# Patient Record
Sex: Male | Born: 1935 | Race: White | Hispanic: No | Smoking: Former smoker
Health system: Southern US, Community
[De-identification: ages and names within clinical notes are randomized; demographics above are authoritative.]

## PROBLEM LIST (undated history)

## (undated) DIAGNOSIS — I442 Atrioventricular block, complete: Secondary | ICD-10-CM

## (undated) DIAGNOSIS — J9819 Other pulmonary collapse: Secondary | ICD-10-CM

## (undated) DIAGNOSIS — E876 Hypokalemia: Secondary | ICD-10-CM

## (undated) DIAGNOSIS — E785 Hyperlipidemia, unspecified: Secondary | ICD-10-CM

## (undated) DIAGNOSIS — M549 Dorsalgia, unspecified: Secondary | ICD-10-CM

## (undated) DIAGNOSIS — G8929 Other chronic pain: Secondary | ICD-10-CM

## (undated) DIAGNOSIS — K219 Gastro-esophageal reflux disease without esophagitis: Secondary | ICD-10-CM

## (undated) DIAGNOSIS — F419 Anxiety disorder, unspecified: Secondary | ICD-10-CM

## (undated) DIAGNOSIS — K222 Esophageal obstruction: Secondary | ICD-10-CM

## (undated) DIAGNOSIS — I639 Cerebral infarction, unspecified: Secondary | ICD-10-CM

## (undated) DIAGNOSIS — T18128A Food in esophagus causing other injury, initial encounter: Secondary | ICD-10-CM

## (undated) DIAGNOSIS — I10 Essential (primary) hypertension: Secondary | ICD-10-CM

## (undated) DIAGNOSIS — I4821 Permanent atrial fibrillation: Secondary | ICD-10-CM

## (undated) HISTORY — DX: Anxiety disorder, unspecified: F41.9

## (undated) HISTORY — DX: Other pulmonary collapse: J98.19

## (undated) HISTORY — DX: Essential (primary) hypertension: I10

## (undated) HISTORY — PX: EYE SURGERY: SHX253

## (undated) HISTORY — DX: Hyperlipidemia, unspecified: E78.5

## (undated) HISTORY — DX: Atrioventricular block, complete: I44.2

## (undated) HISTORY — DX: Hypokalemia: E87.6

## (undated) HISTORY — DX: Dorsalgia, unspecified: M54.9

## (undated) HISTORY — DX: Permanent atrial fibrillation: I48.21

## (undated) HISTORY — DX: Other chronic pain: G89.29

## (undated) HISTORY — DX: Gastro-esophageal reflux disease without esophagitis: K21.9

## (undated) HISTORY — DX: Cerebral infarction, unspecified: I63.9

---

## 1990-03-02 HISTORY — PX: BACK SURGERY: SHX140

## 1991-03-03 DIAGNOSIS — J9819 Other pulmonary collapse: Secondary | ICD-10-CM

## 1991-03-03 HISTORY — DX: Other pulmonary collapse: J98.19

## 1992-05-06 ENCOUNTER — Encounter: Payer: Self-pay | Admitting: Family Medicine

## 1992-05-06 LAB — CONVERTED CEMR LAB
Blood Glucose, Fasting: 97 mg/dL
PSA: 0.5 ng/mL
TSH: 2.96 microintl units/mL

## 1994-12-01 DIAGNOSIS — E785 Hyperlipidemia, unspecified: Secondary | ICD-10-CM

## 1994-12-01 HISTORY — DX: Hyperlipidemia, unspecified: E78.5

## 1994-12-06 ENCOUNTER — Encounter: Payer: Self-pay | Admitting: Family Medicine

## 1994-12-06 LAB — CONVERTED CEMR LAB
Blood Glucose, Fasting: 98 mg/dL
PSA: 0.6 ng/mL
RBC count: 5.46 10*6/uL
TSH: 3.97 microintl units/mL
WBC, blood: 10.1 10*3/uL

## 1998-04-08 ENCOUNTER — Encounter: Payer: Self-pay | Admitting: Family Medicine

## 1998-04-08 LAB — CONVERTED CEMR LAB
Blood Glucose, Fasting: 108 mg/dL
PSA: 0.8 ng/mL

## 1999-05-13 ENCOUNTER — Encounter: Payer: Self-pay | Admitting: Family Medicine

## 1999-05-13 LAB — CONVERTED CEMR LAB
Blood Glucose, Fasting: 101 mg/dL
PSA: 0.9 ng/mL

## 2000-09-20 ENCOUNTER — Encounter: Payer: Self-pay | Admitting: Family Medicine

## 2000-09-20 LAB — CONVERTED CEMR LAB
Blood Glucose, Fasting: 100 mg/dL
PSA: 0.9 ng/mL

## 2000-10-10 ENCOUNTER — Other Ambulatory Visit: Admission: RE | Admit: 2000-10-10 | Discharge: 2000-10-10 | Payer: Self-pay | Admitting: Gastroenterology

## 2000-10-10 ENCOUNTER — Encounter (INDEPENDENT_AMBULATORY_CARE_PROVIDER_SITE_OTHER): Payer: Self-pay | Admitting: Specialist

## 2001-10-09 ENCOUNTER — Encounter: Payer: Self-pay | Admitting: Family Medicine

## 2001-10-09 LAB — CONVERTED CEMR LAB
Blood Glucose, Fasting: 95 mg/dL
PSA: 1 ng/mL
TSH: 2.86 microintl units/mL

## 2002-07-17 ENCOUNTER — Encounter: Payer: Self-pay | Admitting: Family Medicine

## 2002-07-17 LAB — CONVERTED CEMR LAB
Blood Glucose, Fasting: 102 mg/dL
RBC count: 5.56 10*6/uL
TSH: 1.58 microintl units/mL
WBC, blood: 9.1 10*3/uL

## 2002-10-31 DIAGNOSIS — K219 Gastro-esophageal reflux disease without esophagitis: Secondary | ICD-10-CM

## 2002-10-31 HISTORY — DX: Gastro-esophageal reflux disease without esophagitis: K21.9

## 2002-11-20 ENCOUNTER — Encounter: Payer: Self-pay | Admitting: Family Medicine

## 2002-11-20 LAB — CONVERTED CEMR LAB
Blood Glucose, Fasting: 112 mg/dL
PSA: 0.9 ng/mL

## 2004-03-09 ENCOUNTER — Emergency Department (HOSPITAL_COMMUNITY): Admission: EM | Admit: 2004-03-09 | Discharge: 2004-03-09 | Payer: Self-pay | Admitting: Emergency Medicine

## 2004-05-18 ENCOUNTER — Ambulatory Visit: Payer: Self-pay | Admitting: Family Medicine

## 2004-05-18 LAB — CONVERTED CEMR LAB: PSA: 1.45 ng/mL

## 2004-06-02 HISTORY — PX: CARDIAC CATHETERIZATION: SHX172

## 2004-07-28 ENCOUNTER — Ambulatory Visit: Payer: Self-pay | Admitting: Cardiovascular Disease

## 2004-07-28 ENCOUNTER — Encounter: Payer: Self-pay | Admitting: Cardiology

## 2004-07-28 ENCOUNTER — Encounter: Payer: Self-pay | Admitting: Family Medicine

## 2004-07-28 ENCOUNTER — Inpatient Hospital Stay (HOSPITAL_COMMUNITY): Admission: EM | Admit: 2004-07-28 | Discharge: 2004-07-29 | Payer: Self-pay | Admitting: Emergency Medicine

## 2004-07-28 LAB — CONVERTED CEMR LAB
RBC count: 5.44 10*6/uL
WBC, blood: 9.3 10*3/uL

## 2004-07-30 ENCOUNTER — Ambulatory Visit: Payer: Self-pay | Admitting: Gastroenterology

## 2004-08-02 ENCOUNTER — Ambulatory Visit (HOSPITAL_COMMUNITY): Admission: RE | Admit: 2004-08-02 | Discharge: 2004-08-02 | Payer: Self-pay | Admitting: Gastroenterology

## 2004-08-02 ENCOUNTER — Ambulatory Visit: Payer: Self-pay | Admitting: Gastroenterology

## 2004-08-02 ENCOUNTER — Encounter (INDEPENDENT_AMBULATORY_CARE_PROVIDER_SITE_OTHER): Payer: Self-pay | Admitting: Specialist

## 2004-08-02 LAB — HM COLONOSCOPY

## 2004-08-19 ENCOUNTER — Ambulatory Visit: Payer: Self-pay | Admitting: Cardiovascular Disease

## 2004-09-02 ENCOUNTER — Ambulatory Visit: Payer: Self-pay | Admitting: Family Medicine

## 2004-09-17 ENCOUNTER — Ambulatory Visit: Payer: Self-pay | Admitting: Cardiovascular Disease

## 2004-09-28 ENCOUNTER — Ambulatory Visit: Payer: Self-pay | Admitting: Family Medicine

## 2004-09-28 LAB — CONVERTED CEMR LAB
Blood Glucose, Fasting: 116 mg/dL
PSA: 1.32 ng/mL
RBC count: 5.39 10*6/uL
TSH: 2.46 microintl units/mL
WBC, blood: 7.6 10*3/uL

## 2004-09-29 ENCOUNTER — Ambulatory Visit: Payer: Self-pay | Admitting: Family Medicine

## 2004-10-15 ENCOUNTER — Ambulatory Visit: Payer: Self-pay | Admitting: Family Medicine

## 2004-10-22 ENCOUNTER — Ambulatory Visit: Payer: Self-pay | Admitting: Family Medicine

## 2004-11-05 ENCOUNTER — Ambulatory Visit: Payer: Self-pay | Admitting: Family Medicine

## 2004-11-19 ENCOUNTER — Ambulatory Visit: Payer: Self-pay | Admitting: Internal Medicine

## 2004-11-29 ENCOUNTER — Ambulatory Visit: Payer: Self-pay | Admitting: Family Medicine

## 2004-12-13 ENCOUNTER — Ambulatory Visit: Payer: Self-pay | Admitting: Family Medicine

## 2005-01-10 ENCOUNTER — Ambulatory Visit: Payer: Self-pay | Admitting: Family Medicine

## 2005-01-24 ENCOUNTER — Ambulatory Visit: Payer: Self-pay | Admitting: Family Medicine

## 2005-01-27 ENCOUNTER — Ambulatory Visit: Payer: Self-pay | Admitting: Family Medicine

## 2005-02-07 ENCOUNTER — Ambulatory Visit: Payer: Self-pay | Admitting: Internal Medicine

## 2005-02-10 ENCOUNTER — Ambulatory Visit: Payer: Self-pay | Admitting: Family Medicine

## 2005-03-07 ENCOUNTER — Ambulatory Visit: Payer: Self-pay | Admitting: Family Medicine

## 2005-03-11 ENCOUNTER — Ambulatory Visit: Payer: Self-pay | Admitting: Family Medicine

## 2005-03-21 ENCOUNTER — Ambulatory Visit: Payer: Self-pay | Admitting: Family Medicine

## 2005-04-04 ENCOUNTER — Ambulatory Visit: Payer: Self-pay | Admitting: Family Medicine

## 2005-05-04 ENCOUNTER — Ambulatory Visit: Payer: Self-pay | Admitting: Family Medicine

## 2005-06-10 ENCOUNTER — Ambulatory Visit: Payer: Self-pay | Admitting: Family Medicine

## 2005-06-24 ENCOUNTER — Ambulatory Visit: Payer: Self-pay | Admitting: Family Medicine

## 2005-06-30 ENCOUNTER — Ambulatory Visit: Payer: Self-pay | Admitting: Cardiology

## 2005-07-05 ENCOUNTER — Ambulatory Visit: Payer: Self-pay

## 2005-07-22 ENCOUNTER — Ambulatory Visit: Payer: Self-pay | Admitting: Family Medicine

## 2005-07-26 ENCOUNTER — Ambulatory Visit: Payer: Self-pay | Admitting: Cardiology

## 2005-08-04 ENCOUNTER — Ambulatory Visit: Payer: Self-pay | Admitting: Family Medicine

## 2005-08-12 ENCOUNTER — Ambulatory Visit: Payer: Self-pay | Admitting: Family Medicine

## 2005-09-09 ENCOUNTER — Ambulatory Visit: Payer: Self-pay | Admitting: Family Medicine

## 2005-09-19 ENCOUNTER — Ambulatory Visit: Payer: Self-pay | Admitting: Cardiovascular Disease

## 2005-09-29 ENCOUNTER — Ambulatory Visit: Payer: Self-pay | Admitting: Family Medicine

## 2005-09-29 LAB — CONVERTED CEMR LAB
Blood Glucose, Fasting: 111 mg/dL
Hgb A1c MFr Bld: 5.8 %
PSA: 1.71 ng/mL
RBC count: 5.5 10*6/uL
WBC, blood: 7.7 10*3/uL

## 2005-10-03 ENCOUNTER — Ambulatory Visit: Payer: Self-pay | Admitting: Family Medicine

## 2005-10-31 ENCOUNTER — Ambulatory Visit: Payer: Self-pay | Admitting: Family Medicine

## 2005-11-29 ENCOUNTER — Ambulatory Visit: Payer: Self-pay | Admitting: Family Medicine

## 2005-12-06 ENCOUNTER — Ambulatory Visit: Payer: Self-pay | Admitting: Family Medicine

## 2006-01-04 ENCOUNTER — Ambulatory Visit: Payer: Self-pay | Admitting: Family Medicine

## 2006-02-01 ENCOUNTER — Ambulatory Visit: Payer: Self-pay | Admitting: Family Medicine

## 2006-02-15 ENCOUNTER — Ambulatory Visit: Payer: Self-pay | Admitting: Family Medicine

## 2006-03-01 ENCOUNTER — Ambulatory Visit: Payer: Self-pay | Admitting: Family Medicine

## 2006-03-31 ENCOUNTER — Ambulatory Visit: Payer: Self-pay | Admitting: Family Medicine

## 2006-04-04 ENCOUNTER — Ambulatory Visit: Payer: Self-pay | Admitting: Cardiovascular Disease

## 2006-07-05 ENCOUNTER — Ambulatory Visit: Payer: Self-pay | Admitting: Cardiovascular Disease

## 2007-01-09 ENCOUNTER — Ambulatory Visit: Payer: Self-pay | Admitting: Cardiovascular Disease

## 2007-05-02 ENCOUNTER — Telehealth: Payer: Self-pay | Admitting: Family Medicine

## 2007-05-22 ENCOUNTER — Encounter: Payer: Self-pay | Admitting: Family Medicine

## 2007-05-22 DIAGNOSIS — K250 Acute gastric ulcer with hemorrhage: Secondary | ICD-10-CM | POA: Insufficient documentation

## 2007-05-22 DIAGNOSIS — E785 Hyperlipidemia, unspecified: Secondary | ICD-10-CM

## 2007-05-22 DIAGNOSIS — I4891 Unspecified atrial fibrillation: Secondary | ICD-10-CM | POA: Insufficient documentation

## 2007-05-22 DIAGNOSIS — K219 Gastro-esophageal reflux disease without esophagitis: Secondary | ICD-10-CM | POA: Insufficient documentation

## 2007-05-22 DIAGNOSIS — R739 Hyperglycemia, unspecified: Secondary | ICD-10-CM

## 2007-05-25 ENCOUNTER — Ambulatory Visit: Payer: Self-pay | Admitting: Family Medicine

## 2007-05-25 LAB — CONVERTED CEMR LAB
ALT: 16 units/L (ref 0–53)
AST: 19 units/L (ref 0–37)
BUN: 13 mg/dL (ref 6–23)
CO2: 30 meq/L (ref 19–32)
Calcium: 9.7 mg/dL (ref 8.4–10.5)
Chloride: 105 meq/L (ref 96–112)
Cholesterol: 193 mg/dL (ref 0–200)
Creatinine, Ser: 1.1 mg/dL (ref 0.4–1.5)
GFR calc Af Amer: 84 mL/min
GFR calc non Af Amer: 69 mL/min
Glucose, Bld: 111 mg/dL — ABNORMAL HIGH (ref 70–99)
HDL: 38.8 mg/dL — ABNORMAL LOW (ref >39.0)
LDL Cholesterol: 141 mg/dL — ABNORMAL HIGH (ref 0–99)
PSA: 2.17 ng/mL (ref 0.10–4.00)
Potassium: 4.2 meq/L (ref 3.5–5.1)
Sodium: 141 meq/L (ref 135–145)
TSH: 1.66 microintl units/mL (ref 0.35–5.50)
Total CHOL/HDL Ratio: 5
Triglycerides: 68 mg/dL (ref 0–149)
VLDL: 14 mg/dL (ref 0–40)

## 2007-05-31 ENCOUNTER — Ambulatory Visit: Payer: Self-pay | Admitting: Family Medicine

## 2007-07-13 ENCOUNTER — Ambulatory Visit: Payer: Self-pay | Admitting: Cardiovascular Disease

## 2007-09-12 ENCOUNTER — Telehealth: Payer: Self-pay | Admitting: Family Medicine

## 2007-11-21 ENCOUNTER — Ambulatory Visit: Payer: Self-pay | Admitting: Family Medicine

## 2007-11-21 LAB — CONVERTED CEMR LAB
ALT: 15 units/L (ref 0–53)
AST: 21 units/L (ref 0–37)
Albumin: 3.7 g/dL (ref 3.5–5.2)
Alkaline Phosphatase: 59 units/L (ref 39–117)
BUN: 15 mg/dL (ref 6–23)
Bilirubin, Direct: 0.2 mg/dL (ref 0.0–0.3)
CO2: 30 meq/L (ref 19–32)
Calcium: 9.2 mg/dL (ref 8.4–10.5)
Chloride: 108 meq/L (ref 96–112)
Cholesterol: 189 mg/dL (ref 0–200)
Creatinine, Ser: 1.1 mg/dL (ref 0.4–1.5)
Creatinine,U: 196 mg/dL
GFR calc Af Amer: 84 mL/min
GFR calc non Af Amer: 69 mL/min
Glucose, Bld: 100 mg/dL — ABNORMAL HIGH (ref 70–99)
HDL: 32.2 mg/dL — ABNORMAL LOW (ref >39.0)
LDL Cholesterol: 143 mg/dL — ABNORMAL HIGH (ref 0–99)
Microalb Creat Ratio: 5.6 mg/g (ref 0.0–30.0)
Microalb, Ur: 1.1 mg/dL (ref 0.0–1.9)
PSA: 2.28 ng/mL (ref 0.10–4.00)
Potassium: 4.4 meq/L (ref 3.5–5.1)
Sodium: 141 meq/L (ref 135–145)
TSH: 2.51 microintl units/mL (ref 0.35–5.50)
Total Bilirubin: 1.1 mg/dL (ref 0.3–1.2)
Total CHOL/HDL Ratio: 5.9
Total Protein: 7.1 g/dL (ref 6.0–8.3)
Triglycerides: 71 mg/dL (ref 0–149)
VLDL: 14 mg/dL (ref 0–40)

## 2007-11-26 ENCOUNTER — Ambulatory Visit: Payer: Self-pay | Admitting: Family Medicine

## 2008-01-08 ENCOUNTER — Ambulatory Visit: Payer: Self-pay | Admitting: Cardiovascular Disease

## 2008-03-19 ENCOUNTER — Encounter: Payer: Self-pay | Admitting: Internal Medicine

## 2008-04-17 ENCOUNTER — Telehealth: Payer: Self-pay | Admitting: Family Medicine

## 2008-07-21 ENCOUNTER — Encounter: Payer: Self-pay | Admitting: Cardiovascular Disease

## 2008-07-21 ENCOUNTER — Ambulatory Visit: Payer: Self-pay | Admitting: Cardiovascular Disease

## 2008-07-21 DIAGNOSIS — I10 Essential (primary) hypertension: Secondary | ICD-10-CM | POA: Insufficient documentation

## 2008-11-21 ENCOUNTER — Ambulatory Visit: Payer: Self-pay | Admitting: Family Medicine

## 2008-11-21 LAB — CONVERTED CEMR LAB
ALT: 14 units/L (ref 0–53)
AST: 14 units/L (ref 0–37)
Albumin: 3.6 g/dL (ref 3.5–5.2)
Alkaline Phosphatase: 56 units/L (ref 39–117)
BUN: 13 mg/dL (ref 6–23)
Basophils Absolute: 0 10*3/uL (ref 0.0–0.1)
Basophils Relative: 0.3 % (ref 0.0–3.0)
Bilirubin, Direct: 0.1 mg/dL (ref 0.0–0.3)
CO2: 30 meq/L (ref 19–32)
Calcium: 9.3 mg/dL (ref 8.4–10.5)
Chloride: 107 meq/L (ref 96–112)
Cholesterol: 172 mg/dL (ref 0–200)
Creatinine, Ser: 1.1 mg/dL (ref 0.4–1.5)
Creatinine,U: 208.5 mg/dL
Eosinophils Absolute: 0.5 10*3/uL (ref 0.0–0.7)
Eosinophils Relative: 6 % — ABNORMAL HIGH (ref 0.0–5.0)
GFR calc non Af Amer: 68.83 mL/min (ref >60)
Glucose, Bld: 108 mg/dL — ABNORMAL HIGH (ref 70–99)
HCT: 48 % (ref 39.0–52.0)
HDL: 34.1 mg/dL — ABNORMAL LOW (ref >39.00)
Hemoglobin: 16.2 g/dL (ref 13.0–17.0)
Hgb A1c MFr Bld: 5.9 % (ref 4.6–6.5)
LDL Cholesterol: 127 mg/dL — ABNORMAL HIGH (ref 0–99)
Lymphocytes Relative: 38.4 % (ref 12.0–46.0)
Lymphs Abs: 3 10*3/uL (ref 0.7–4.0)
MCHC: 33.8 g/dL (ref 30.0–36.0)
MCV: 91 fL (ref 78.0–100.0)
Microalb Creat Ratio: 3.8 mg/g (ref 0.0–30.0)
Microalb, Ur: 0.8 mg/dL (ref 0.0–1.9)
Monocytes Absolute: 0.7 10*3/uL (ref 0.1–1.0)
Monocytes Relative: 8.8 % (ref 3.0–12.0)
Neutro Abs: 3.6 10*3/uL (ref 1.4–7.7)
Neutrophils Relative %: 46.5 % (ref 43.0–77.0)
PSA: 2.78 ng/mL (ref 0.10–4.00)
Platelets: 239 10*3/uL (ref 150.0–400.0)
Potassium: 4.5 meq/L (ref 3.5–5.1)
RBC: 5.27 M/uL (ref 4.22–5.81)
RDW: 13.1 % (ref 11.5–14.6)
Sodium: 141 meq/L (ref 135–145)
TSH: 1.75 microintl units/mL (ref 0.35–5.50)
Total Bilirubin: 1.2 mg/dL (ref 0.3–1.2)
Total CHOL/HDL Ratio: 5
Total Protein: 7 g/dL (ref 6.0–8.3)
Triglycerides: 55 mg/dL (ref 0.0–149.0)
VLDL: 11 mg/dL (ref 0.0–40.0)
WBC: 7.8 10*3/uL (ref 4.5–10.5)

## 2008-11-24 ENCOUNTER — Ambulatory Visit: Payer: Self-pay | Admitting: Family Medicine

## 2008-11-24 DIAGNOSIS — F411 Generalized anxiety disorder: Secondary | ICD-10-CM

## 2008-11-24 DIAGNOSIS — H33019 Retinal detachment with single break, unspecified eye: Secondary | ICD-10-CM | POA: Insufficient documentation

## 2008-11-24 DIAGNOSIS — E559 Vitamin D deficiency, unspecified: Secondary | ICD-10-CM | POA: Insufficient documentation

## 2008-11-25 ENCOUNTER — Encounter (INDEPENDENT_AMBULATORY_CARE_PROVIDER_SITE_OTHER): Payer: Self-pay | Admitting: *Deleted

## 2008-11-25 ENCOUNTER — Ambulatory Visit: Payer: Self-pay | Admitting: Cardiovascular Disease

## 2008-11-25 DIAGNOSIS — I2510 Atherosclerotic heart disease of native coronary artery without angina pectoris: Secondary | ICD-10-CM

## 2008-11-25 LAB — CONVERTED CEMR LAB: Vit D, 25-Hydroxy: 21 ng/mL — ABNORMAL LOW (ref 30–89)

## 2008-12-11 ENCOUNTER — Ambulatory Visit (HOSPITAL_COMMUNITY): Admission: RE | Admit: 2008-12-11 | Discharge: 2008-12-12 | Payer: Self-pay | Admitting: Ophthalmology

## 2009-01-02 ENCOUNTER — Encounter: Admission: RE | Admit: 2009-01-02 | Discharge: 2009-01-02 | Payer: Self-pay | Admitting: Family Medicine

## 2009-01-02 ENCOUNTER — Ambulatory Visit: Payer: Self-pay | Admitting: Family Medicine

## 2009-01-02 ENCOUNTER — Telehealth (INDEPENDENT_AMBULATORY_CARE_PROVIDER_SITE_OTHER): Payer: Self-pay | Admitting: Internal Medicine

## 2009-01-02 DIAGNOSIS — R079 Chest pain, unspecified: Secondary | ICD-10-CM

## 2009-01-06 ENCOUNTER — Telehealth: Payer: Self-pay | Admitting: Family Medicine

## 2009-02-24 ENCOUNTER — Ambulatory Visit: Payer: Self-pay | Admitting: Cardiovascular Disease

## 2009-08-10 ENCOUNTER — Ambulatory Visit: Payer: Self-pay | Admitting: Cardiovascular Disease

## 2009-08-11 ENCOUNTER — Ambulatory Visit: Payer: Self-pay | Admitting: Family Medicine

## 2009-08-11 LAB — CONVERTED CEMR LAB
Glucose, Bld: 97 mg/dL (ref 70–99)
Hgb A1c MFr Bld: 5.9 % (ref 4.6–6.5)

## 2009-08-12 LAB — CONVERTED CEMR LAB: Vit D, 25-Hydroxy: 21 ng/mL — ABNORMAL LOW (ref 30–89)

## 2009-12-08 ENCOUNTER — Encounter (INDEPENDENT_AMBULATORY_CARE_PROVIDER_SITE_OTHER): Payer: Self-pay | Admitting: *Deleted

## 2010-02-09 ENCOUNTER — Ambulatory Visit: Payer: Self-pay | Admitting: Cardiovascular Disease

## 2010-05-04 ENCOUNTER — Telehealth: Payer: Self-pay | Admitting: Family Medicine

## 2010-06-01 NOTE — Assessment & Plan Note (Signed)
Summary: f43m/dm      Allergies Added:   Primary Provider:  Shaune Leeks MD  CC:  no complaints.  History of Present Illness: Dakota [name] is seen today for f/U of afib and HTN.  He was last seen by CM for preopertative clearance in my absence.  He has no known CAD.  He has chronic afib and has refused to be on coumadin. Forutuanatly he has not had a CVA, TIA or embolic event.  His BP is under good control.   I discussed Pradaxa with him and he still wishes to be just on asa since he feels so well I encouraged him to F/U with Dr Jerolyn Center as his vision is still a problem  Current Problems (verified): 1)  Rib Pain, Right Sided  (ICD-786.50) 2)  Cad, Native Vessel  (ICD-414.01) 3)  Pre-operative Cardiovascular Examination  (ICD-V72.81) 4)  Unspecified Vitamin D Deficiency  (ICD-268.9) 5)  Anxiety  (ICD-300.00) 6)  Recent Ret Detach Partial W/single Defect  (ICD-361.01) 7)  Essential Hypertension, Benign  (ICD-401.1) 8)  Special Screening Malignant Neoplasm of Prostate  (ICD-V76.44) 9)  Back Pain, Chronic S/p Opn  (ICD-724.5) 10)  Hyperglycemia  (ICD-790.29) 11)  Gastric Ulcer, Acute, Hemorrhage  (ICD-531.00) 12)  Atrial Fibrillation  (ICD-427.31) 13)  Hyperlipidemia  (ICD-272.4) 14)  Gerd  (ICD-530.81)  Current Medications (verified): 1)  Alprazolam 0.5 Mg  Tabs (Alprazolam) .... 1/2 Tab By Mouth Two Times A Day As Needed For Anxiety. 2)  Adult Aspirin Ec Low Strength 81 Mg  Tbec (Aspirin) .[name].. 1 Daily 3)  Carvedilol 25 Mg Tabs (Carvedilol) .[name].. 1 Twice A Day By Mouth 4)  Vitamin D3 1000 Unit Caps (Cholecalciferol) .[name].. 1 Tab By Mouth Once Daily 5)  Tums 500 Mg Chew (Calcium Carbonate Antacid) .... As Needed  Allergies (verified): 1)  ! Penicillin 2)  ! Valium 3)  ! * Lipitor 4)  ! * Zetia  Past History:  Past Medical History: Last updated: 11/25/2008 GERD: (10/2002) Hyperlipidemia:(12/1994)  hypertension Chronic persistent atrial fibrillation, not on coumadin  therapy. Chronic back pain  Past Surgical History: Last updated: 11/26/2007 BACK OPERATION (03/1990) BACK OPN WITH HARDWARE FIXATION:(03/1990) FALL WITH COLLAPSED LUNG           :(03/1991) COLONOSCOPY ; POLYPS X 3 B--9:(10/10/2000) [facility] RULE OUT'S CATH30% STENOSIS  DIAGNOSIS; EF NORMAL:(03/29-30/2006) COLONOSCOPY POLYPS :(08/02/2004) TO RETURN 2009 EGD GASTRITIS; H. H.:(08/02/2004)  Family History: Last updated: 12-21-2008 Father:DECEASED 70 YOA --SPLENIC CANCER  Mother: DECEASED 93 YOA FRACTURED HIP; PNEUMONIA; BLOOD TRANSFUSIONS Siblings: OLDEST BROTHJER DIED  CANCER OF THROAT AND LUNGS 1 BROTHER DECEASED FROM PROSTATE CANCER 67 YOA 2 OTHER BROTHERS DECEASED YOUNGEST SON MI SISTER A 18 (Edna) DM SISTER A 80 (Margaret) SISTER A 75 (Frankie) CV: NEGATIVE HBP + SELF DM: NEGATIVE GOUT/ARTHRITIS: BREAST.OVARIAN/UTERINE CANCER : NEGATIVE  PROSTATE CANCER: + BROTHER  COLON CANCER: +FATHER SPLENIC// + BROTHER CANCER OF THROAT AND LUNG  Social History: Last updated: 11/25/2008 Marital Status: Married: LIVES WITH WIFE Children: 4 CHILDREN 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   Vital Signs:  Patient profile:   77 year old male Height:      68 inches Weight:      198 pounds BMI:     30.21 Pulse rate:   81 /  minute Resp:     14 per minute BP sitting:   138 / 90  (left arm)  Vitals Entered By: Kem Parkinson (February 09, 2010 10:23 AM)  Physical Exam  General:  Affect appropriate Healthy:  appears stated age HEENT: normal Neck supple with no adenopathy JVP normal no bruits no thyromegaly Lungs clear with no wheezing and good diaphragmatic motion Heart:  S1/S2 no murmur,rub, gallop or click PMI  normal Abdomen: benighn, BS positve, no tenderness, no AAA no bruit.  No HSM or HJR Distal pulses intact with no bruits No edema Neuro non-focal Skin warm and dry    Impression & Recommendations:  Problem # 1:  ESSENTIAL HYPERTENSION, BENIGN (ICD-401.1) Well controlled His updated medication list for this problem includes:    Adult Aspirin Ec Low Strength 81 Mg Tbec (Aspirin) .[name]... 1 daily    Carvedilol 25 Mg Tabs (Carvedilol) .[name]... 1 twice a day by mouth  Problem # 2:  ATRIAL FIBRILLATION (ICD-427.31) Good rate controll.  Discussed at length and he is adamant about no anticoagulation despite risk of stroke His updated medication list for this problem includes:    Adult Aspirin Ec Low Strength 81 Mg Tbec (Aspirin) .[name]... 1 daily    Carvedilol 25 Mg Tabs (Carvedilol) .[name]... 1 twice a day by mouth  Patient Instructions: 1)  Your physician recommends that you schedule a follow-up appointment in: 6 MONTHS   EKG Report  Procedure date:  02/09/2010  Findings:      Afib 81 Otherwise normla ECg

## 2010-06-01 NOTE — Letter (Signed)
Summary: Nadara Eaton letter  [place] [facility]  [address] [place], [place] [address]   Phone: [phone]  Fax: [phone]       12/08/2009 MRN: [id number]  Dakota [name] [address] [place], [place]  [address]  Dear Mr. Clydell Hak[facility] [place], and [facility] announce the retirement of Arta Silence, M.D., from full-time practice at the [facility] office effective October 29, 2009 and his plans of returning part-time.  It is important to Dr. Hetty Ely and to our practice that you understand that [facility] has seven physicians in our office for your health care needs.  We will continue to offer the same exceptional care that you have today.    Dr. Hetty Ely has spoken to many of you about his plans for retirement and returning part-time in the fall.   We will continue to work with you through the transition to schedule appointments for you in the office and meet the high standards that [facility] is committed to.   Again, it is with great pleasure that we share the news that Dr. Hetty Ely will return to [facility] in October of 2011 with a reduced schedule.    If you have any questions, or would like to request an appointment with one of our physicians, please call us at [phone] and press the option for Scheduling an appointment.  We take pleasure in providing you with excellent patient care and look forward to seeing you at your next office visit.  Our [facility] Physicians are:  Tillman Abide, M.D. Laurita Quint, M.D. Roxy Manns, M.D. Kerby Nora, M.D. Hannah Beat, M.D. Ruthe Mannan, M.D. We proudly welcomed Raechel Ache, M.D. and Eustaquio Boyden, M.D. to the practice in July/August 2011.  Sincerely,  [facility]

## 2010-06-01 NOTE — Assessment & Plan Note (Signed)
Summary: FOLLOW UP   Vital Signs:  Patient profile:   77 year old male Weight:      196.50 pounds Temp:     98.1 degrees F oral Pulse rate:   72 / minute Pulse rhythm:   irregular BP sitting:   124 / 84  (left arm) Cuff size:   regular  Vitals Entered By: Sydell Axon LPN (August 11, 2009 10:16 AM) CC: follow-up visit   History of Present Illness: Pt here for routine followup, scheduled by him. Has been seeing Willaim Sheng, NP for some time. He has started on Vit D 1000Iu two times a day. He had ankle fx and was in the hosp. 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 If I knew what it would be like, I would never have had it done. He sees extra things peripherally. He recently saw Dr Eden Emms and was told he is doing ok.   Problems Prior to Update: 1)  Rib Pain, Right Sided  (ICD-786.50) 2)  Cad, Native Vessel  (ICD-414.01) 3)  Pre-operative Cardiovascular Examination  (ICD-V72.81) 4)  Unspecified Vitamin D Deficiency  (ICD-268.9) 5)  Anxiety  (ICD-300.00) 6)  Recent Ret Detach Partial W/single Defect  (ICD-361.01) 7)  Essential Hypertension, Benign  (ICD-401.1) 8)  Special Screening Malignant Neoplasm of Prostate  (ICD-V76.44) 9)  Back Pain, Chronic S/p Opn  (ICD-724.5) 10)  Hyperglycemia  (ICD-790.29) 11)  Gastric Ulcer, Acute, Hemorrhage  (ICD-531.00) 12)  Atrial Fibrillation  (ICD-427.31) 13)  Hyperlipidemia  (ICD-272.4) 14)  Gerd  (ICD-530.81)  Medications Prior to Update: 1)  Alprazolam 0.5 Mg  Tabs (Alprazolam) .... 1/2 Tab By Mouth Two Times A Day As Needed For Anxiety. 2)  Adult Aspirin Ec Low Strength 81 Mg  Tbec (Aspirin) .[name].. 1 Daily 3)  Carvedilol 25 Mg Tabs (Carvedilol) .[name].. 1 Twice A Day By Mouth 4)  Vitamin D3 1000 Unit Caps (Cholecalciferol) .[name].. 1 Tab By Mouth Once Daily 5)  Tums 500 Mg Chew (Calcium Carbonate Antacid) .... As Needed  Allergies: 1)  ! Penicillin 2)  !  Valium 3)  ! * Lipitor 4)  ! * Zetia  Physical Exam  General:  Well-developed,well-nourished,in no acute distress; alert,appropriate and cooperative throughout examination, mildly stooped posture with gait. Head:  Normocephalic and atraumatic without obvious abnormalities. No apparent alopecia or balding. Eyes:  Conjunctiva clear bilaterally.  Ears:  External ear exam shows no significant lesions or deformities.  Otoscopic examination reveals clear canals, tympanic membranes are intact bilaterally without bulging, retraction, inflammation or discharge. Hearing is grossly normal bilaterally. Nose:  External nasal examination shows no deformity or inflammation. Nasal mucosa are pink and moist without lesions or exudates. Mouth:  Oral mucosa and oropharynx without lesions or exudates.  Teeth in good repair. Neck:  No deformities, masses, or tenderness noted. Lungs:  Normal respiratory effort, chest expands symmetrically. Lungs are clear to auscultation, no crackles or wheezes. Heart:  mildly irrregularly irregular rate and rhythm. Nml heart sounds otherwise. Abdomen:  Bowel sounds positive,abdomen soft and non-tender without masses, organomegaly. 3cm umbilical hernia noted. No inguinal hernias. Msk:  No deformity or scoliosis noted of thoracic or lumbar spine.  Mild discomfoert with mildly decreased mid to lower back mobility. Extremities:  No clubbing, cyanosis, edema, or deformity noted with normal full range of motion of all joints.   Skin:  Intact without suspicious lesions or rashes, moderate sundamage to exposed skin. Psych:  Cognition and  judgment appear intact. Alert and cooperative with normal attention span and concentration. No apparent delusions, illusions, hallucinations   Impression & Recommendations:  Problem # 1:  UNSPECIFIED VITAMIN D DEFICIENCY (ICD-268.9) Assessment Improved Is ioon replacement. Will recheck today and report via phone tree. Orders: T-Vitamin D (25-Hydroxy)  [phone])  Problem # 2:  ANXIETY (ICD-300.00) Assessment: Unchanged  Does well with 1/2 tab once or twice a day. Has wife with multiple medical problems.  Script sent in.   His updated medication list for this problem includes:    Alprazolam 0.5 Mg Tabs (Alprazolam) .[name]... 1/2 tab by mouth two times a day as needed for anxiety.  Orders: Prescription Created Electronically [phone])  Problem # 3:  ESSENTIAL HYPERTENSION, BENIGN (ICD-401.1) Assessment: Unchanged Stable. Cont curr meds. His updated medication list for this problem includes:    Carvedilol 25 Mg Tabs (Carvedilol) .[name]... 1 twice a day by mouth  BP today: 124/84 Prior BP: 123/85 (08/10/2009)  Labs Reviewed: K+: 4.5 (11/21/2008) Creat: : 1.1 (11/21/2008)   Chol: 172 (11/21/2008)   HDL: 34.10 (11/21/2008)   LDL: 127 (11/21/2008)   TG: 55.0 (11/21/2008)  Problem # 4:  HYPERGLYCEMIA (ICD-790.29) Assessment: Unchanged  Nos at home good. Was given insulin in the hospital altho he didn't think he needed it. Will check today and repoort via phone tree. Orders: TLB-Glucose, QUANT ([id number]) TLB-A1C / Hgb A1C (Glycohemoglobin) (83036-A1C)  Labs Reviewed: Creat: 1.1 (11/21/2008)     Problem # 5:  RIB PAIN, RIGHT SIDED (ICD-786.50) Assessment: Improved  Complete Medication List: 1)  Alprazolam 0.5 Mg Tabs (Alprazolam) .... 1/2 tab by mouth two times a day as needed for anxiety. 2)  Adult Aspirin Ec Low Strength 81 Mg Tbec (Aspirin) .[name].. 1 daily 3)  Carvedilol 25 Mg Tabs (Carvedilol) .[name].. 1 twice a day by mouth 4)  Vitamin D3 1000 Unit Caps (Cholecalciferol) .[name].. 1 tab by mouth once daily 5)  Tums 500 Mg Chew (Calcium carbonate antacid) .... As needed  Patient Instructions: 1)  Will report lab reports via phone tree. Prescriptions: ALPRAZOLAM 0.5 MG  TABS (ALPRAZOLAM) 1/2 tab by mouth two times a day as needed for anxiety.  #60 x 5   Entered and Authorized by:   Shaune Leeks MD   Signed by:   Shaune Leeks MD on 08/11/2009   Method used:   Telephoned to ...       [facility] [address] [phone]* (retail)       [address]       [place], [place]  [address]       Ph: [phone]       Fax: [phone]   RxID:   [id number]   Current Allergies (reviewed today): ! PENICILLIN ! VALIUM ! * LIPITOR ! * ZETIA Rx called to pharmacy as instructed. Sydell Axon LPN  August 11, 2009 12:07 PM

## 2010-06-01 NOTE — Assessment & Plan Note (Signed)
Summary: F6M/DM  Medications Added TUMS 500 MG CHEW (CALCIUM CARBONATE ANTACID) as needed      Allergies Added:   Primary Provider:  Shaune Leeks MD  CC:  finger pain.  History of Present Illness: Dakota [name] is seen today for f/U of afib and HTN.  He was last seen by CM for preopertative clearance in my absence.  He has no known CAD.  He has chronic afib and has refused to be on coumadin. Forutuanatly he has not had a CVA, TIA or embolic event.  His BP is under good control.  Dr. Ashley Royalty operated on his left eye with moderate success.  There is still blurryness.  He has been compliant with his meds and is taking a baby ASA  I discussed Pradaxa with him and he still wishes to be just on asa since he feels so well  Current Problems (verified): 1)  Rib Pain, Right Sided  (ICD-786.50) 2)  Cad, Native Vessel  (ICD-414.01) 3)  Pre-operative Cardiovascular Examination  (ICD-V72.81) 4)  Unspecified Vitamin D Deficiency  (ICD-268.9) 5)  Anxiety  (ICD-300.00) 6)  Recent Ret Detach Partial W/single Defect  (ICD-361.01) 7)  Essential Hypertension, Benign  (ICD-401.1) 8)  Special Screening Malignant Neoplasm of Prostate  (ICD-V76.44) 9)  Back Pain, Chronic S/p Opn  (ICD-724.5) 10)  Hyperglycemia  (ICD-790.29) 11)  Gastric Ulcer, Acute, Hemorrhage  (ICD-531.00) 12)  Atrial Fibrillation  (ICD-427.31) 13)  Hyperlipidemia  (ICD-272.4) 14)  Gerd  (ICD-530.81)  Current Medications (verified): 1)  Alprazolam 0.5 Mg  Tabs (Alprazolam) .... 1/2 Tab By Mouth Two Times A Day As Needed For Anxiety. 2)  Adult Aspirin Ec Low Strength 81 Mg  Tbec (Aspirin) .[name].. 1 Daily 3)  Carvedilol 25 Mg Tabs (Carvedilol) .[name].. 1 Twice A Day By Mouth 4)  Vitamin D3 1000 Unit Caps (Cholecalciferol) .[name].. 1 Tab By Mouth Once Daily 5)  Tums 500 Mg Chew (Calcium Carbonate Antacid) .... As Needed  Allergies (verified): 1)  ! Penicillin 2)  ! Valium 3)  ! * Lipitor 4)  ! * Zetia  Past History:  Past Medical  History: Last updated: 11/25/2008 GERD: (10/2002) Hyperlipidemia:(12/1994)  hypertension Chronic persistent atrial fibrillation, not on coumadin therapy. Chronic back pain  Past Surgical History: Last updated: 11/26/2007 BACK OPERATION (03/1990) BACK OPN WITH HARDWARE FIXATION:(03/1990) FALL WITH COLLAPSED LUNG           :(03/1991) COLONOSCOPY ; POLYPS X 3 B--9:(10/10/2000) [facility] RULE OUT'S CATH30% STENOSIS  DIAGNOSIS; EF NORMAL:(03/29-30/2006) COLONOSCOPY POLYPS :(08/02/2004) TO RETURN 2009 EGD GASTRITIS; H. H.:(08/02/2004)  Family History: Last updated: 12/12/08 Father:DECEASED 70 YOA --SPLENIC CANCER  Mother: DECEASED 21 YOA FRACTURED HIP; PNEUMONIA; BLOOD TRANSFUSIONS Siblings: OLDEST BROTHJER DIED  CANCER OF THROAT AND LUNGS 1 BROTHER DECEASED FROM PROSTATE CANCER 67 YOA 2 OTHER BROTHERS DECEASED YOUNGEST SON MI SISTER A 73 (Edna) DM SISTER A 80 (Margaret) SISTER A 75 (Frankie) CV: NEGATIVE HBP + SELF DM: NEGATIVE GOUT/ARTHRITIS: BREAST.OVARIAN/UTERINE CANCER : NEGATIVE  PROSTATE CANCER: + BROTHER  COLON CANCER: +FATHER SPLENIC// + BROTHER CANCER OF THROAT AND LUNG  Social History: Last updated: 11/25/2008 Marital Status: Married: LIVES WITH WIFE Children: 4 CHILDREN 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   Vital Signs:  Patient profile:   77 year old male Height:  68 inches Weight:      197 pounds Pulse rate:   73 / minute Pulse rhythm:   irregularly irregular Resp:     18 per minute BP sitting:   123 / 85  (left arm)  Vitals Entered By: Kem Parkinson (August 10, 2009 10:57 AM)  Physical Exam  General:  Affect appropriate Healthy:  appears stated age HEENT: normal Neck supple with no  adenopathy JVP normal no bruits no thyromegaly Lungs clear with no wheezing and good diaphragmatic motion Heart:  S1/S2 no murmur,rub, gallop or click PMI normal Abdomen: benighn, BS positve, no tenderness, no AAA no bruit.  No HSM or HJR Distal pulses intact with no bruits No edema Neuro non-focal Skin warm and dry    Impression & Recommendations:  Problem # 1:  ESSENTIAL HYPERTENSION, BENIGN (ICD-401.1) Well controlled His updated medication list for this problem includes:    Adult Aspirin Ec Low Strength 81 Mg Tbec (Aspirin) .[name]... 1 daily    Carvedilol 25 Mg Tabs (Carvedilol) .[name]... 1 twice a day by mouth  Problem # 2:  ATRIAL FIBRILLATION (ICD-427.31) Asymptomatic with good rate control.  Does not wish to be on coumadin or Pradaxa His updated medication list for this problem includes:    Adult Aspirin Ec Low Strength 81 Mg Tbec (Aspirin) .[name]... 1 daily    Carvedilol 25 Mg Tabs (Carvedilol) .[name]... 1 twice a day by mouth Prescriptions: CARVEDILOL 25 MG TABS (CARVEDILOL) 1 twice a day by mouth  #180 x 3   Entered by:   Kem Parkinson   Authorized by:   Colon Branch, MD, [facility]   Signed by:   Kem Parkinson on 08/10/2009   Method used:   Electronically to        [facility] [phone]* (retail)       [address]       [place], [place]  [address]       Ph: [phone]       Fax: [phone]   RxID:   [id number]

## 2010-06-03 NOTE — Progress Notes (Signed)
Summary: Rx Alprazolam  Phone Note Refill Request Call back at [phone] Message from:  [facility] on May 04, 2010 2:19 PM  Refills Requested: Medication #1:  ALPRAZOLAM 0.5 MG  TABS 1/2 tab by mouth two times a day as needed for anxiety.   Last Refilled: 01/28/2010  Method Requested: Telephone to Pharmacy Initial call taken by: Sydell Axon LPN,  May 04, 2010 2:20 PM  Follow-up for Phone Call         Patient notified as instructed by telephone and appt  was scheduled. Rx called to pharmacy. Follow-up by: Sydell Axon LPN,  May 04, 2010 4:53 PM    Prescriptions: ALPRAZOLAM 0.5 MG  TABS (ALPRAZOLAM) 1/2 tab by mouth two times a day as needed for anxiety.  #60 x 1   Entered and Authorized by:   Shaune Leeks MD   Signed by:   Shaune Leeks MD on 05/04/2010   Method used:   Telephoned to ...       [facility] [address] [phone]* (retail)       [address]       [place], [place]  [address]       Ph: [phone]       Fax: [phone]   RxID:   [id number]  Pt needs to be seen in Apr at least for recheck. Shaune Leeks MD  May 04, 2010 4:29 PM

## 2010-06-19 ENCOUNTER — Encounter: Payer: Self-pay | Admitting: Family Medicine

## 2010-08-07 LAB — CBC
HCT: 47.2 % (ref 39.0–52.0)
Hemoglobin: 16.4 g/dL (ref 13.0–17.0)
MCHC: 34.8 g/dL (ref 30.0–36.0)
MCV: 88.8 fL (ref 78.0–100.0)
Platelets: 234 10*3/uL (ref 150–400)
RBC: 5.32 MIL/uL (ref 4.22–5.81)
RDW: 13.4 % (ref 11.5–15.5)
WBC: 11.4 10*3/uL — ABNORMAL HIGH (ref 4.0–10.5)

## 2010-08-07 LAB — BASIC METABOLIC PANEL
BUN: 11 mg/dL (ref 6–23)
CO2: 23 mEq/L (ref 19–32)
Calcium: 9.3 mg/dL (ref 8.4–10.5)
Chloride: 107 mEq/L (ref 96–112)
Creatinine, Ser: 0.85 mg/dL (ref 0.4–1.5)
GFR calc Af Amer: >60 mL/min (ref >60)
GFR calc non Af Amer: >60 mL/min (ref >60)
Glucose, Bld: 102 mg/dL — ABNORMAL HIGH (ref 70–99)
Potassium: 4.1 mEq/L (ref 3.5–5.1)
Sodium: 136 mEq/L (ref 135–145)

## 2010-08-07 LAB — GLUCOSE, CAPILLARY
Glucose-Capillary: 102 mg/dL — ABNORMAL HIGH (ref 70–99)
Glucose-Capillary: 120 mg/dL — ABNORMAL HIGH (ref 70–99)
Glucose-Capillary: 142 mg/dL — ABNORMAL HIGH (ref 70–99)

## 2010-08-07 LAB — AUTOLOGOUS SERUM PATCH PREP

## 2010-08-11 ENCOUNTER — Ambulatory Visit (INDEPENDENT_AMBULATORY_CARE_PROVIDER_SITE_OTHER): Payer: Medicare Other | Admitting: Family Medicine

## 2010-08-11 ENCOUNTER — Encounter: Payer: Self-pay | Admitting: Family Medicine

## 2010-08-11 DIAGNOSIS — R7309 Other abnormal glucose: Secondary | ICD-10-CM

## 2010-08-11 DIAGNOSIS — E559 Vitamin D deficiency, unspecified: Secondary | ICD-10-CM

## 2010-08-11 DIAGNOSIS — I10 Essential (primary) hypertension: Secondary | ICD-10-CM

## 2010-08-11 DIAGNOSIS — E785 Hyperlipidemia, unspecified: Secondary | ICD-10-CM

## 2010-08-11 DIAGNOSIS — H33019 Retinal detachment with single break, unspecified eye: Secondary | ICD-10-CM

## 2010-08-11 DIAGNOSIS — F411 Generalized anxiety disorder: Secondary | ICD-10-CM

## 2010-08-11 MED ORDER — ALPRAZOLAM 0.5 MG PO TABS: ORAL_TABLET | ORAL | Status: DC

## 2010-08-11 NOTE — Assessment & Plan Note (Signed)
A1C 5.9, fasting nml. Still looks to be glucose intol not diabetic.

## 2010-08-11 NOTE — Assessment & Plan Note (Signed)
Good control. Also followed by Dr Eden Emms. Cont curr meds. Dr Eden Emms wants to put him on "new med." He is unaware of the name.

## 2010-08-11 NOTE — Patient Instructions (Signed)
RTC for Comp Exam, labs prior. 

## 2010-08-11 NOTE — Assessment & Plan Note (Signed)
Needs to be rechecked. Will do so at Comp Exam.

## 2010-08-11 NOTE — Assessment & Plan Note (Signed)
Stable. Is using Xanax cautiously and as ordered. Cont.

## 2010-08-11 NOTE — Progress Notes (Signed)
  Subjective:    Patient ID: Dakota [name], male    DOB: [birth year], 77 y.o.   MRN: [id number]  HPI Pt here for generalized recheck. He hasn't been here in a while. He feels well and his biggest problem is eyes. His left eyre had a hole in the retina and had a bubble placed. Catha Brow had cataract removed which essentially did not help his vision.  His wife is still having lots of problems which causes his anxiety and therefore he needs the Xanax. She is very noncompliant with her meds. She had significant back surgery that didn't help a lot. She had been in the OR 9 hrs for the surgery.   Review of Systems  Constitutional: Negative for fever, chills, diaphoresis, appetite change, fatigue and unexpected weight change.  HENT: Negative for hearing loss, ear pain, tinnitus and ear discharge.   Eyes: Positive for visual disturbance (see HPI, 20/30 OD 20/40 OS). Negative for pain and discharge.  Respiratory: Negative for cough, shortness of breath and wheezing.   Cardiovascular: Positive for chest pain (rare often migrates one side to the other.). Negative for palpitations.       No SOB w/ exertion  Gastrointestinal: Negative for nausea, vomiting, abdominal pain, diarrhea, constipation and blood in stool.       No heartburn or swallowing problems.  Genitourinary: Negative for dysuria, frequency and difficulty urinating.       No nocturia He had urinary leakage wit 2 Vit D pills, now better.  Musculoskeletal: Negative for myalgias, back pain and arthralgias.       Lag pains at times that feels "hot" Vit D gavre him bumps on his arms.  Skin: Negative for rash.       No itching or dryness. Rash with 2 Vit D pills a day.  Neurological: Negative for tremors and numbness.       No tingling or balance problems.  Hematological: Negative for adenopathy. Does not bruise/bleed easily.  Psychiatric/Behavioral: Negative for dysphoric mood and agitation.   He checks his sugar often in the morning and is  always below 115. Once in the last year after eating it was 140. He often has "low" now recently.    Objective:   Physical Exam  Constitutional: He appears well-developed and well-nourished. No distress.  HENT:  Head: Normocephalic and atraumatic.  Right Ear: External ear normal.  Left Ear: External ear normal.  Nose: Nose normal.  Mouth/Throat: Oropharynx is clear and moist.  Eyes: Conjunctivae and EOM are normal. Pupils are equal, round, and reactive to light. Right eye exhibits no discharge. Left eye exhibits no discharge.  Neck: Normal range of motion. Neck supple.  Cardiovascular: Normal rate and regular rhythm.   Pulmonary/Chest: Effort normal and breath sounds normal. He has no wheezes.  Lymphadenopathy:    He has no cervical adenopathy.  Skin: He is not diaphoretic.          Assessment & Plan:

## 2010-08-11 NOTE — Assessment & Plan Note (Signed)
Stable. Had cataracts removed as well which he doesn't think helped a whole lot. His vision is not bad as is however. Cont.

## 2010-08-11 NOTE — Assessment & Plan Note (Signed)
Still low but unable to tolerate more than once a day, which is better than taking none. Cont. He will try going up again.

## 2010-08-18 ENCOUNTER — Ambulatory Visit (INDEPENDENT_AMBULATORY_CARE_PROVIDER_SITE_OTHER): Payer: Medicare Other | Admitting: Cardiovascular Disease

## 2010-08-18 ENCOUNTER — Encounter: Payer: Self-pay | Admitting: Cardiovascular Disease

## 2010-08-18 DIAGNOSIS — I4891 Unspecified atrial fibrillation: Secondary | ICD-10-CM

## 2010-08-18 DIAGNOSIS — I10 Essential (primary) hypertension: Secondary | ICD-10-CM

## 2010-08-18 DIAGNOSIS — E785 Hyperlipidemia, unspecified: Secondary | ICD-10-CM

## 2010-08-18 NOTE — Assessment & Plan Note (Signed)
Cholesterol is at goal.  Continue current dose of statin and diet Rx.  No myalgias or side effects.  F/U  LFT's in 6 months. Lab Results  Component Value Date   LDLCALC 127* 11/21/2008

## 2010-08-18 NOTE — Patient Instructions (Addendum)
Your physician recommends that you schedule a follow-up appointment in: 6 months with Dr. Nishan 

## 2010-08-18 NOTE — Assessment & Plan Note (Signed)
Good rate control. No anticoagulation at patient request

## 2010-08-18 NOTE — Progress Notes (Signed)
Dakota [name] is seen today for f/U of afib and HTN.  He was last seen by CM for preopertative clearance in my absence.  He has no known CAD.  He has chronic afib and has refused to be on coumadin. Forutuanatly he has not had a CVA, TIA or embolic event.  His BP is under good control.   I discussed Pradaxa with him and he still wishes to be just on asa since he feels so well I encouraged him to F/U with Dr Jerolyn Center as his vision is still a problem  ROS: Denies fever, malais, weight loss, blurry vision, decreased visual acuity, cough, sputum, SOB, hemoptysis, pleuritic pain, palpitaitons, heartburn, abdominal pain, melena, lower extremity edema, claudication, or rash.   General: Affect appropriate Healthy:  appears stated age HEENT: normal Neck supple with no adenopathy JVP normal no bruits no thyromegaly Lungs clear with no wheezing and good diaphragmatic motion Heart:  S1/S2 no murmur,rub, gallop or click PMI normal Abdomen: benighn, BS positve, no tenderness, no AAA no bruit.  No HSM or HJR Distal pulses intact with no bruits No edema Neuro non-focal Skin warm and dry No muscular weakness   Current Outpatient Prescriptions  Medication Sig Dispense Refill  . ALPRAZolam (XANAX) 0.5 MG tablet Take one half by mouth two times a day as needed for anxiety.  30 tablet  2  . aspirin 81 MG EC tablet Take 81 mg by mouth daily.        . calcium carbonate (TUMS) 500 MG chewable tablet as needed.        . carvedilol (COREG) 25 MG tablet Take 25 mg by mouth 2 (two) times daily.        . Cholecalciferol (VITAMIN D3) 1000 UNITS capsule Take 1,000 Units by mouth daily.          Allergies  Atorvastatin; Diazepam; Ezetimibe; and Penicillins  Electrocardiogram:  Assessment and Plan

## 2010-08-18 NOTE — Assessment & Plan Note (Signed)
Initially elevated on office visit as he was rushing from Dr [employer] office.  Improved during visit.

## 2010-09-09 ENCOUNTER — Other Ambulatory Visit: Payer: Self-pay | Admitting: Family Medicine

## 2010-09-09 DIAGNOSIS — K219 Gastro-esophageal reflux disease without esophagitis: Secondary | ICD-10-CM

## 2010-09-09 DIAGNOSIS — I10 Essential (primary) hypertension: Secondary | ICD-10-CM

## 2010-09-09 DIAGNOSIS — I4891 Unspecified atrial fibrillation: Secondary | ICD-10-CM

## 2010-09-09 DIAGNOSIS — R7309 Other abnormal glucose: Secondary | ICD-10-CM

## 2010-09-09 DIAGNOSIS — E785 Hyperlipidemia, unspecified: Secondary | ICD-10-CM

## 2010-09-09 DIAGNOSIS — I2510 Atherosclerotic heart disease of native coronary artery without angina pectoris: Secondary | ICD-10-CM

## 2010-09-09 DIAGNOSIS — E559 Vitamin D deficiency, unspecified: Secondary | ICD-10-CM

## 2010-09-14 NOTE — Assessment & Plan Note (Signed)
[facility]                            CARDIOLOGY OFFICE NOTE   NAME:Lesure, [name]                      MRN:          [id number]  DATE:01/08/2008                            DOB:          Mar 28, 1929    Dakota [name] returns today for followup.  He has chronic atrial fibrillation.  He is not wanting to take Coumadin.  He has been on aspirin.  Fortunately, has not had any TIA or CVA like symptoms.   He has had atypical chest pain in the past.  He continues to get  fleeting episodes of right-sided pain, better not necessarily  exertional, 1-2 times per month.  He had a normal cath in 2006.  We have  not bothered to do a followup stress test since his pain is so atypical.   There has been no progression and no symptoms.   He has not had any significant syncope or palpitations.  He has mild  exertional dyspnea which seems functional.   He apparently has had a previous history of polyps and needs to have  followup colonoscopy and EGD.  He takes regular Zantac for this.  These  symptoms have been fine.  He has intolerances to statin drugs.   He is currently taking an aspirin a day, Zantac 150 day, metoprolol 25  mg in the morning, 12.5 at night.   His review of systems otherwise negative.  He does have some issues  regarding a probable cataract in his left eye and needs to see an  ophthalmologist.   GENERAL:  Exam today is remarkable for an elderly white male in no  distress.  VITAL SIGNS:  Weight is 197, blood pressure is 140/80, pulse 71 and  regular, respiratory rate 14, afebrile.  HEENT:  Unremarkable.  Carotids were without bruit, no lymphadenopathy,  thyromegaly, JVP elevation.  LUNGS:  Clear.  Good diaphragmatic motion.  No wheezing.  HEART:  S1 and S2, normal heart sounds.  PMI normal.  ABDOMEN:  Benign.  Bowel sounds positive.  No AAA, no tenderness, no  bruit, no hepatosplenomegaly, hepatojugular reflux.  No tenderness.  EXTREMITIES:  Distal  pulses were intact.  No edema.  NEURO:  Nonfocal.  SKIN:  Warm and dry with multiple excoriations in lower extremities.  He  tends to work outside and cuts his legs on wood chips all the time.  No  muscular weakness.   His EKG shows AFib and nonspecific ST-T wave changes.   IMPRESSION:  1. Chronic atrial fibrillation.  Good rate control, refusing to take      Coumadin.  Continue aspirin therapy.  2. Hypertension, currently well controlled.  Continue low-salt diet      and current dose of beta-blocker.  3. Previous history of polyps.  Followup with GI, continue Zantac for      gastroesophageal reflux disease like symptoms.  4. Question cataract in the left eye, follow-up with Ophthalmology.      No evidence of transient ischemic attack or embolic events.   I will see him back in 6 months' time.     Noralyn Pick. Eden Emms, MD,  [facility]  Electronically Signed    PCN/MedQ  DD: 01/08/2008  DT: 01/09/2008  Job #: [id number]

## 2010-09-14 NOTE — Op Note (Signed)
NAME:  Dakota [name], Dakota [name] NO.:  [id number]   MEDICAL RECORD NO.:  [id number]          PATIENT TYPE:  OIB   LOCATION:  5121                         FACILITY:  [facility]   PHYSICIAN:  Beulah Gandy. Ashley Royalty, M.D. DATE OF BIRTH:  02/22/1929   DATE OF PROCEDURE:  12/11/2008  DATE OF DISCHARGE:                               OPERATIVE REPORT   ADMISSION DIAGNOSIS:  Stage IV macular hole, left eye.   PROCEDURES:  Pars plana vitrectomy, retinal photocoagulation, membrane  peel, internal limiting membrane removal, serum patch, and gas-fluid  exchange in the left eye.   SURGEON:  Beulah Gandy. Ashley Royalty, MD   ASSISTANT:  Rosalie Doctor, MA   ANESTHESIA:  General.   DETAILS:  Usual prep and drape.  Conjunctival peritomy at 10, 2, and 4  o'clock.  Infusion at 4 o'clock.  Contact lens ring anchored into place  at 6 and 12 o'clock.  Provisc was placed on the corneal surface and the  flat contact lens was placed.  Pars plana vitrectomy was begun just  behind the crystalline lens.  Transparent membranes were encountered  throughout the vitreous cavity.  These were carefully removed under low  suction and rapid cutting.  Attention was carried to the macular hole  region where membranes were carefully removed with the vitreous cutter.  The silicone tip suction line was brought into place and the fish-strike  sign was occurred.  The membranes were attached to the edge of the  macular hole and these were peeled with the lighted pick and the  silicone tip suction line.  A 30-degree prismatic lens was then moved  into place and the vitrectomy was carried out into the far periphery for  360 degrees.  The indirect ophthalmoscope laser was used to deliver 1267  burns around the retinal periphery.  The power was 460 milliwatts, 1000  microns each, and 0.1 seconds each.  The magnifying contact lens was  moved into place and the ILM was grasped with curved ILM forceps and the  diamond-dusted membrane  scraper.  This was removed from the entire  circumference of the hole and into the hole.  A total gas-fluid exchange  was performed.  Additional fluid was allowed to track down the walls of  the eye and collect in the posterior segment.  This fluid was removed  with the [employer] brush.  The serum patch and perfluoropropane  mixture to 16% was performed was prepared.  Additional fluid was removed  with the [employer] brush.  Serum patch was delivered.  Additional serum patch was removed with a [employer] brush and  C3F8 of 16% was exchanged for intravitreal gas.  The instruments were  removed from the eye.  A 9-0 nylon was used to close the sclerotomy  sites.  The conjunctiva was closed with wet-field cautery.  The wounds  were tested and found to be tight.  A 30-gauge injection of C3F8 of 16%  was performed to obtain a closing pressure of 10 with a [place]  tonometer.  Polymyxin and gentamicin were irrigated into [place] space.  Atropine solution was  applied.  Marcaine was injected around the globe  for postop pain.  Decadron 10 mg was injected into the lower  subconjunctival space.  Polysporin ophthalmic ointment, a patch, and  shield were placed.  The patient was awakened and taken to recovery in  satisfactory condition.   COMPLICATIONS:  None.   DURATION:  1 hour.      Beulah Gandy. Ashley Royalty, M.D.  Electronically Signed     JDM/MEDQ  D:  12/11/2008  T:  12/12/2008  Job:  [id number]

## 2010-09-14 NOTE — Assessment & Plan Note (Signed)
[facility]ALTHC[facility]                            CARDIOLOGY OFFICE NOTE   NAME:Dakota [name], Dakota [name]                      MRN:          [id number]  DATE:01/09/2007                            DOB:          [birth year]    Katsumi returns today for followup. He has had previous atypical chest  pain, chronic atrial fibrillation and hypertension. In regards to his  atrial fibrillation, he is not having significant palpitations. He has  refused to be on Coumadin in the past. Certainly, he was quite sick on  it. He had significant dementia and felt horrible. He actually is doing  much better off the warfarin. I told him currently there are no other  oral replacements for it and he will continue an aspirin a day despite  the increased risk of a CVA. He has been having atypical chest pain.  There is no history of coronary disease. He had a non-ischemic Myoview  in 2006. His pain is fleeting and is sharp and it is in the center of  his chest. It is not progressive. It lasts only seconds. It is non-  exertional. It can happen at any time. I told him I was not sure what  was causing it, but I do not think that it was cardiac in etiology. The  patient has a borderline hypertension. We had had him on Toprol. He  prefers to take a half tablet b.i.d. and this seems to be controlling  his rate and his blood pressure. He has not had any significant  headaches, PND or orthopnea. There has been palpitations, or syncope.   REVIEW OF SYSTEMS:  Is otherwise negative.   MEDICATIONS:  1. Xanax 0.5 b.i.d.  2. Zantac 150 a day.  3. Alprazolam 0.5 a day.  4. Toprol 25 a half tablet in the morning and half at night.  5. Baby aspirin a day.   PHYSICAL EXAMINATION:  His examination is remarkable for an elderly  white male in no distress. Affect is appropriate. Weight is 197. Blood  pressure 140/80. Pulse 86 and irregular. Respiratory rate is 14. He is  afebrile.  HEENT: Is normal.  NECK:  Supple. There is no thyromegaly. No lymphadenopathy. No JVP  elevation. No bruits.  LUNGS:  Are clear with good diaphragmatic motion.  No wheezing.  There is an S1, S2 with normal heart sounds. PMI is normal.  ABDOMEN: Is benign. Bowel sounds are positive. No tenderness. No  hepatosplenomegaly. No hepatojugular reflux. No AAA. No bruits.  LOWER EXTREMITIES: Femorals are +3 bilaterally without bruits, PT's are  +2. There is no lower extremity edema.  NEURO: Is nonfocal.  SKIN: Is warm and dry.  There is no muscular weakness on examination.   EKG shows atrial fibrillation with nonspecific ST-T wave changes.   IMPRESSION:  1. Chronic atrial fibrillation with good rate control with beta-      blocker. The patient refusing to take Coumadin. Continue an aspirin      a day.  2. Hypertension. Currently well-controlled on beta-blockers only. No      need for additional medication. Continue low  salt diet. No evidence      of LVH on EKG.  3. Atypical chest pain, non-ischemic Myoview in 2006. The pain is very      atypical. I can think he can have a followup stress test in about a      year. He will continue his aspirin and a beta-blocker as is.   Overall, I think the patient is doing well and I will see him back in  six months.     Noralyn Pick. Eden Emms, MD, [facility]  Electronically Signed    PCN/MedQ  DD: 01/09/2007  DT: 01/09/2007  Job #: [id number]

## 2010-09-14 NOTE — Assessment & Plan Note (Signed)
[facility]ALTHCARE                            CARDIOLOGY OFFICE NOTE   NAME:WYRICKKirklin, Mcduffee                      MRN:          [id number]  DATE:07/13/2007                            DOB:          August 14, 1928    Mr. Rosamond returns today follow-up.  I have seen him for atrial  fibrillation and hypertension.  He has chronic a fib.  He does not want  to be on Coumadin.  We had a lengthy discussion again about this.  His  son understands that there is a risk of stroke on just aspirin.  The  patient is pretty steadfast.  He also was a little bit concerned that  his long-acting metoprolol was nationally back ordered.  The substitute  he did not like as much.  He is talking to his pharmacist.  He does not  like generic drugs.  He is trying to get regular Toprol back.  He does  occasionally feel palpitations but they are benign.  They are no worse  than usual.  He is not having any significant chest pain, PND,  orthopnea. There has been no bleeding diathesis and no dyspnea.   The patient has been compliant with his aspirin and beta blocker.   REVIEW OF SYSTEMS:  Remarkable for a bit of reflux which he takes Zantac  for.   The patient and his son had questions regarding coenzyme Q and fish oil.  I told him both are reasonable for him to have.   The patient's review of systems is otherwise negative.   MEDICATIONS:  Aspirin a day, metoprolol ER 12.5 mg a day,  Zantac 150 a  day.   PHYSICAL EXAMINATION:  Exam is remarkable for an elderly white male in  no distress.  Affect is appropriate.  Weight is 207, blood pressure is  138/80, pulse is 88 and regular, respiratory rate 14, afebrile.  HEENT:  Unremarkable.  Carotids normal, without bruit, no  lymphadenopathy, thyromegaly or JVP elevation.  LUNGS:  Clear, with good diaphragmatic motion.  No wheezing.  HEART:  S1-S2, normal heart sounds, PMI normal.  ABDOMEN:  Benign.  Bowel sounds positive.  No hepatosplenomegaly  or  hepatojugular reflux.  No bruit, no tenderness.  EXTREMITIES:  Distal  pulses intact, no edema.  NEURO:  Nonfocal.  SKIN:  Warm and dry.  MUSCULOSKELETAL:  No muscle weakness.   IMPRESSION:  1. Chronic atrial fibrillation, good rate control with beta blocker.      The patient does not want to be on Coumadin.  Continue aspirin.  2. Hypertension currently well controlled.  Continue metoprolol, low-      salt diet.  3. History of reflux.  Continue Zantac, avoid spicy food, currently no      indication for endoscopy.     Noralyn Pick. Eden Emms, MD, [facility]  Electronically Signed    PCN/MedQ  DD: 07/13/2007  DT: 07/14/2007  Job #: [id number]

## 2010-09-15 ENCOUNTER — Other Ambulatory Visit (INDEPENDENT_AMBULATORY_CARE_PROVIDER_SITE_OTHER): Payer: Medicare Other | Admitting: Family Medicine

## 2010-09-15 DIAGNOSIS — E785 Hyperlipidemia, unspecified: Secondary | ICD-10-CM

## 2010-09-15 DIAGNOSIS — R7309 Other abnormal glucose: Secondary | ICD-10-CM

## 2010-09-15 DIAGNOSIS — E559 Vitamin D deficiency, unspecified: Secondary | ICD-10-CM

## 2010-09-15 DIAGNOSIS — I2510 Atherosclerotic heart disease of native coronary artery without angina pectoris: Secondary | ICD-10-CM

## 2010-09-15 DIAGNOSIS — I10 Essential (primary) hypertension: Secondary | ICD-10-CM

## 2010-09-15 LAB — HEPATIC FUNCTION PANEL
Albumin: 3.4 g/dL — ABNORMAL LOW (ref 3.5–5.2)
Total Protein: 6.5 g/dL (ref 6.0–8.3)

## 2010-09-15 LAB — RENAL FUNCTION PANEL
Chloride: 106 mEq/L (ref 96–112)
GFR: 70.73 mL/min (ref >60.00)
Glucose, Bld: 94 mg/dL (ref 70–99)
Phosphorus: 2.6 mg/dL (ref 2.3–4.6)
Potassium: 4.6 mEq/L (ref 3.5–5.1)

## 2010-09-15 LAB — TSH: TSH: 3.24 u[IU]/mL (ref 0.35–5.50)

## 2010-09-15 LAB — LIPID PANEL
Cholesterol: 165 mg/dL (ref 0–200)
HDL: 37.9 mg/dL — ABNORMAL LOW (ref >39.00)
LDL Cholesterol: 117 mg/dL — ABNORMAL HIGH (ref 0–99)
Total CHOL/HDL Ratio: 4
Triglycerides: 50 mg/dL (ref 0.0–149.0)
VLDL: 10 mg/dL (ref 0.0–40.0)

## 2010-09-15 LAB — CBC WITH DIFFERENTIAL/PLATELET
Basophils Absolute: 0.1 10*3/uL (ref 0.0–0.1)
Eosinophils Absolute: 0.5 10*3/uL (ref 0.0–0.7)
HCT: 47.8 % (ref 39.0–52.0)
Hemoglobin: 16.4 g/dL (ref 13.0–17.0)
Lymphs Abs: 3 10*3/uL (ref 0.7–4.0)
MCHC: 34.3 g/dL (ref 30.0–36.0)
Monocytes Relative: 9.2 % (ref 3.0–12.0)
Neutro Abs: 4 10*3/uL (ref 1.4–7.7)
Platelets: 228 10*3/uL (ref 150.0–400.0)
RDW: 13.8 % (ref 11.5–14.6)

## 2010-09-16 LAB — MICROALBUMIN / CREATININE URINE RATIO
Creatinine,U: 130 mg/dL
Microalb Creat Ratio: 0.8 mg/g (ref 0.0–30.0)

## 2010-09-17 NOTE — Assessment & Plan Note (Signed)
[facility]                            CARDIOLOGY OFFICE NOTE   [name], Dakota [name]                      MRN:          [id number]  DATE:07/05/2006                            DOB:          1929/02/05    Dakota [name] returns today for followup.  When I last saw him, he had chronic  atrial fibrillation and felt horrible on Coumadin.  His hypertension was  borderline controlled.  We sent him home and decided to stop his  Coumadin.  He feels much better.  His weakness is gone.  His myalgias  are gone.  His mind is clearing, and he feels like his dementia is  gone.  He understands that he is at risk for stroke, but he prefers to  take a baby aspirin.  Similarly, on higher dose beta blockers he had  blurry vision and felt weak.  He is now only taking 12.5 of Toprol a  day, and feels much better.  His blood pressures a home have been  borderline.  However, I told him since he seems to be sensitive to  medications, we would not add anything at this point, and he can  continue to monitor them at home.  His systolic readings have been  between 125 and 148, and diastolics have been okay.   REVIEW OF SYSTEMS:  He gets occasional palpitations, but no syncope,  chest pain, PND or orthopnea.   He is actually getting some work done around the house and around the  yard now.   His medications include:  1. Xanax 0.25 b.i.d.  2. Zantac 150 two a day.  3. Baby aspirin.  4. Toprol 12.5 a day.   EXAMINATION:  He is in atrial fibrillation at a rate of 80.  Blood  pressure is 130/78.  HEENT:  Normal.  Carotids are normal without bruits.  LUNGS:  Clear.  There is an S1 and S2 with normal heart sounds.  ABDOMEN:  Benign.  LOWER EXTREMITIES:  Intact pulses.  No edema.   IMPRESSION:  Chronic atrial fibrillation.  Aspirin therapy only.  Patient prefers not to take Coumadin.  Rate control and blood pressure  reasonably controlled low-dose Toprol.  In the future if his  blood  pressure gets to be an issue, we can add low-dose ACE inhibitor and hope  the patient does not have a side effect to it.  He is currently much  improved, and prefers to stay on his current medications, and I think  this is reasonable.  I will see him back in 6 months.     Dakota [name]. Eden Emms, MD, [facility]  Electronically Signed   PCN/MedQ  DD: 07/05/2006  DT: 07/05/2006  Job #: [id number]

## 2010-09-17 NOTE — Discharge Summary (Signed)
NAMELEXTON, Dakota [name] NO.:  [id number]   MEDICAL RECORD NO.:  [id number]          PATIENT TYPE:  INP   LOCATION:  3734                         FACILITY:  [facility]   PHYSICIAN:  Charlton Haws, M.D.     DATE OF BIRTH:  10-Jul-1928   DATE OF ADMISSION:  07/28/2004  DATE OF DISCHARGE:  07/29/2004                                 DISCHARGE SUMMARY   PROCEDURE:  1.  Cardiac catheterization.  2.  Coronary arteriogram.  3.  Left ventriculogram.   HOSPITAL COURSE:  [name] is a 77 year old male with no known history of  coronary artery disease.  He had severe substernal chest pain and got relief  with nitroglycerin and a GI cocktail.  He came to the emergency room  and  was admitted for further evaluation and treatment.  Mr. More was in atrial  fib with rapid ventricular response [name] admission.  He was started [name] a beta  blocker and tolerated this well.  His heart rate and blood pressure  stabilized.  Because of the severity of his pain, cardiac catheterization  was indicated to evaluate his anatomy.  Cardiac catheterization showed no  significant coronary artery disease.  He had a 30% stenosis in a diagonal  and in an OM and his EF was normal.  It was felt that his pain was not  cardiac.  There was concern for GI origin of his pain and he had seen Dr.  Russella Dar in the past.  He is to follow up with Dr. Russella Dar as an outpatient and  hopefully get endoscopy and colonoscopy next week.  He was started [name]  Protonix in the hospital and is to continue this.  Post cath, [name] was  without chest pain or shortness of breath.  His groin was stable and he was  discharged [name] July 29, 2004, with outpatient follow up arranged.   DISCHARGE DIAGNOSIS:  1.  Chest pain, no significant coronary artery disease by cardiac      catheterization, follow up with gastroenterology.  2.  Hyperlipidemia.  3.  Atrial fibrillation, rate controlled [name] low dose Toprol.  4.  Intolerance to cholesterol  drugs with headache and diarrhea.  5.  Status post back surgery.  6.  Status post dislocation of the right arm.  7.  Gastroesophageal reflux disease symptoms.   DISCHARGE INSTRUCTIONS:  Activity level includes no strenuous activities for  two days.  He is to call the office with problems with the cath site.  He is  to follow up with Dr. Eden Emms and with Dr. Russella Dar and the office will call for  both those appointments.   DISCHARGE MEDICATIONS:  1.  Aspirin 81 mg daily.  2.  Toprol XL 25 mg daily.  3.  Protonix 40 mg daily.      RB/MEDQ  D:  07/29/2004  T:  07/29/2004  Job:  [id number]   cc:   Venita Lick. Russella Dar, M.D. [facility]   Laurita Quint, M.D.  [address] [place]  [place] [address]  Fax: [phone]

## 2010-09-17 NOTE — H&P (Signed)
NAME:  Dakota [name], Dakota [name] NO.:  [id number]   MEDICAL RECORD NO.:  [id number]          PATIENT TYPE:  INP   LOCATION:  1825                         FACILITY:  [facility]   PHYSICIAN:  Dakota [name], M.D.     DATE OF BIRTH:  07/04/1928   DATE OF ADMISSION:  07/28/2004  DATE OF DISCHARGE:                                HISTORY & PHYSICAL   Admission for chest pain.   Dakota [name] is a 77 year old patient of Dakota [name].  He has coronary risk  factors including hypercholesterolemia and positive family history.  He has  a longstanding history of palpitations, but no documentation of an  arrhythmia.  The patient also has a history of reflux and hiatal hernia.  He  has had epigastric pain in the past which he takes Tums for.  However, last  night he had severe chest pain with some radiation to the epigastric area.  He had diaphoresis.  He has a son who has had a heart problem, and he took  one of his nitroglycerins, with some relief.  EMS came and gave him another  nitroglycerin with further relief.  He also had some relief with a GI  cocktail.   The patient currently is having some substernal chest pain.   The pain last night was somewhat different from his typical indigestion.   In regard to the patient's palpitations, he was found to be in atrial  fibrillation in the ER.  This has never been documented before.  In general,  he does not notice his heart arrhythmia except on occasion.  He does get  palpitations.  Review of systems otherwise benign.   PAST MEDICAL HISTORY:  1.  Previous fall, with multiple back surgeries.  2.  The patient apparently has hypercholesterolemia, but was unable to take      cholesterol medicine due to multiple side effects including nausea,      diarrhea, and some muscle pains.  He did take some aspirin yesterday and      today.   The patient is fairly active.  He hunts.  His wife has significant mental  and health issues, and he seems to take  a lot of time caring for her.  He  has three sons and a daughter in the area whom he is close with.  His  daughter was with him in the ER.  The patient does not smoke.  He does not  drink excessively.  He has not noticed any problems in regard to diarrhea or  blood in his stool.   EXAMINATION:  VITAL SIGNS:  Blood pressure 128/70.  Of note, during the pain  at night, he had a blood pressure cuff that showed an elevated BP.  Heart  rate was 80 and regular.  HEENT:  He has a bit of a headache from his nitroglycerin that was started  in the ER.  LUNGS:  Clear.  CARDIOVASCULAR:  There is an S1 and S2, normal heart sounds.  ABDOMEN:  He has mild epigastric pain on palpation.  There is no AAA.  EXTREMITIES:  Distal pulses  are intact, with no edema.   EKG shows nonspecific ST-T wave changes, atrial fibrillation, at a well-  controlled rate.  Chest x-ray shows no cardiomegaly, no active disease.  Labs are pending.   IMPRESSION:  The patient's symptoms are somewhat worrisome.  They are  different from his previous indigestion.  There was some relief with  nitroglycerin.  He had some radiation to the arm with diaphoresis.  The  patient will be admitted for rule out MI.  He will be treated with heparin  and nitroglycerin.  The patient will have a CT echocardiogram to assess  atrial sizes, given his atrial fibrillation, and also regional wall motion  abnormalities.  I discussed the options with the daughter and the patient.  I believe the best approach should be to proceed with diagnostic heart  catheterization in the morning.  We will continue Protonix and a GI  cocktail.  If his heart catheterization is negative, I would proceed with  outpatient endoscopy.   The patient will be monitored in regard to his rhythm.  Until his GI tract  is cleared, I would not start him on Coumadin.  His rate is well controlled  off any AV nodal blocking drugs suggesting sick sinus syndrome.  I would not  be  very aggressive about cardioverting this patient unless he has decreased  LV function by echocardiogram.  Further recommendations will be based on his  lab work and heart catheterization.      PN/MEDQ  D:  07/28/2004  T:  07/28/2004  Job:  [id number]   cc:   Dakota [name], M.D.  [address] [place]  [place] [address]  Fax: [phone]

## 2010-09-17 NOTE — Letter (Signed)
March 31, 2006    Noralyn Pick. Eden Emms, MD, [facility]  [address]  [address]  [place], [place] [address]   RE:  Dakota [name], Dakota [name]  MRN:  [id number]  /  DOB:  02-19-1929   Dear Dr. Eden Emms:   This is to refer back Dakota [name], a 77 year old white male who has  chronic a-fib.  He has been on Coumadin for a while, had significant  body aches and pains and stopped the medicine on his own.  He has been  on it since March of 2006 and was feeling terrible, requiring home  physical therapy and occupational therapy, and was not improving  whatsoever.  He stopped the Coumadin and a month later he was  significantly improved.  He is currently on an aspirin a day, and I am  wondering if there is an alternative, either Aggrenox or Plavix, is it  worthwhile him being on those as opposed to just the aspirin itself.  Otherwise his medical health is fine.  His cholesterol when last checked  May of this year:  Triglycerides were 97, HDL was 30.9, LDL was 142,  possibly can start working on that at this point.  I appreciate your  seeing him, and look forward to your evaluation.    Sincerely,      Arta Silence, MD  Electronically Signed    RNS/MedQ  DD: 03/31/2006  DT: 04/01/2006  Job #: [id number]

## 2010-09-17 NOTE — Assessment & Plan Note (Signed)
[facility]                            CARDIOLOGY OFFICE NOTE   NAME:Dakota [name]                      MRN:          [id number]  DATE:04/04/2006                            DOB:          [birth year]    Dakota [name] returns today for followup.  Apparently, he has been talking to  Dr. Hetty Ely.  The patient has chronic atrial fibrillation.  He has felt  horrible on Coumadin.  He feels it has made him feel forgetful and  almost demented.  He had horrible pain in his legs.  As far as I can  tell, there was no evidence of cholesterol emboli syndrome or blue-toe  syndrome.  He had lower extremity ABIs in March of 2007, which showed  normal ABIs with normal flow.   The patient made a decision to stop his Coumadin and says he feels 100%  better.  We had a long discussion today about the risks of CVA and  chronic atrial fibrillation off Coumadin.  The patient said he  understands this, but thought he would be dead if he continued to take  it.  I also explained to him that he would not be a candidate for any of  our research trials regarding oral XA inhibitors, if he was not willing  to be randomized back to Coumadin.  He understands the risk of massive  CVA and feels more comfortable off Coumadin.  His rate control is fine  on Toprol 12.5 a day.   He is on two aspirin a day.   REVIEW OF SYSTEMS:  He has denied any significant chest pain, PND or  orthopnea.  He has had a previous heart catheterization without critical  coronary disease and has normal LV function.   MEDICATIONS INCLUDE:  1. Xanax 0.25 b.i.d.  2. Zantac 150 a day.  3. Two aspirin a day.  4. Toprol 12.5 a day.   PHYSICAL EXAMINATION:  HEENT:  Normal.  There is no thyromegaly, no  lymphadenopathy, no carotid bruits.  Lungs are clear.  There is an S1,  S2, systolic ejection murmur.  Abdomen is benign.  Lower extremities  have +3 pulses, no edema.  No evidence of distal emboli.   His EKG shows  atrial fibrillation at a rate of 88 with nonspecific STT-  wave changes.   IMPRESSION:  Chronic atrial fibrillation, rate control reasonable on  beta blocker.  The patient refuses to take Coumadin.  Risk of stroke  understood.  Continue aspirin a day.   We will keep an eye out for any possibilities in regards to oral XA  inhibitors or Coumadin alternatives.   The patient was not interested in b.i.d. Lovenox indefinitely.   The patient will follow up with Dr. Hetty Ely in regards to his primary  care.  I will see him back in about six months.     Dakota [name]. Dakota Emms, MD, [facility]  Electronically Signed    PCN/MedQ  DD: 04/04/2006  DT: 04/05/2006  Job #: [id number]   cc:   Arta Silence, MD

## 2010-09-17 NOTE — Cardiovascular Report (Signed)
NAME:  Dakota [name], Dakota [name] NO.:  [id number]   MEDICAL RECORD NO.:  [id number]          PATIENT TYPE:  INP   LOCATION:  3734                         FACILITY:  [facility]   PHYSICIAN:  Arturo Morton. Riley Kill, M.D. [facility] OF BIRTH:  1928-07-02   DATE OF PROCEDURE:  07/29/2004  DATE OF DISCHARGE:                              CARDIAC CATHETERIZATION   INDICATIONS:  Mr. Milhorn is a 77 year old gentleman who presented with  atrial fibrillation with rapid ventricular response. He had some chest pain.  He is planned to be put in for MRI by Dr. Eden Emms. Current study was done to  assess coronary anatomy.   PROCEDURE:  1.  Left heart catheterization.  2.  Selective coronary arteriography.  3.  Selective left ventriculography.   DESCRIPTION OF PROCEDURE:  The procedure was performed from the right  femoral artery using 4-French catheters. He tolerated procedure without  complication was taken to the holding area in satisfactory clinical  condition.   Hemodynamic data:  1.  Central aortic pressure 141/98.  2.  Left ventricular pressure 140/17.  3.  No gradient pullback across aortic valve.   Angiographic data:  1.  Ventriculography was done in the RAO projection.  Because of the atrial      fibrillation, it was difficult to calculate EF but overall, the      ventricle appeared to squeeze reasonably well.  2.  The right coronary artery was essentially a nondominant vessel. It was      smooth and free and clear of critical disease.  3.  The left main coronary artery is a large caliber vessel that is short      and free of critical disease.  4.  The left anterior descending artery courses to the apex.  The LAD has      two diagonal branches both of which have some luminal irregularities but      there does not appear to be disease in excess of about 40% throughout.      No areas of high-grade focal stenosis are noted. The distal vessel wraps      the apical tip.  5.  The circumflex  is a dominant vessel. There is a first marginal that has      about a 30-40% area of segmental plaque at the ostium. The second      marginal appears to demonstrate minimal luminal irregularity at the      ostium but no high-grade critical disease. The AV circumflex courses to      the AV groove and there is a small inferior branch as noted. Critical      disease is not noted. A significant portion of the inferior wall is      supplied by the distal portion of the LAD.   CONCLUSIONS:  1.  Preserved overall LV function.  2.  Scattered coronary irregularities without high-grade critical stenoses.   DISPOSITION:  1.  Add low dose Toprol for both hypertension and rate control.  2.  The patient will need endoscopic procedures according to Dr. Eden Emms.  3.  He will be  enrolled in __________.      TDS/MEDQ  D:  07/29/2004  T:  07/29/2004  Job:  [id number]   cc:   Laurita Quint, M.D.  [address] [place]  [place] [address]  Fax: [phone]   Charlton Haws, M.D.

## 2010-09-22 ENCOUNTER — Ambulatory Visit (INDEPENDENT_AMBULATORY_CARE_PROVIDER_SITE_OTHER): Payer: Medicare Other | Admitting: Family Medicine

## 2010-09-22 ENCOUNTER — Encounter: Payer: Self-pay | Admitting: Family Medicine

## 2010-09-22 DIAGNOSIS — I10 Essential (primary) hypertension: Secondary | ICD-10-CM

## 2010-09-22 DIAGNOSIS — F411 Generalized anxiety disorder: Secondary | ICD-10-CM

## 2010-09-22 DIAGNOSIS — R7309 Other abnormal glucose: Secondary | ICD-10-CM

## 2010-09-22 DIAGNOSIS — K250 Acute gastric ulcer with hemorrhage: Secondary | ICD-10-CM

## 2010-09-22 DIAGNOSIS — H33019 Retinal detachment with single break, unspecified eye: Secondary | ICD-10-CM

## 2010-09-22 DIAGNOSIS — E785 Hyperlipidemia, unspecified: Secondary | ICD-10-CM

## 2010-09-22 DIAGNOSIS — E559 Vitamin D deficiency, unspecified: Secondary | ICD-10-CM

## 2010-09-22 DIAGNOSIS — K219 Gastro-esophageal reflux disease without esophagitis: Secondary | ICD-10-CM

## 2010-09-22 DIAGNOSIS — I4891 Unspecified atrial fibrillation: Secondary | ICD-10-CM

## 2010-09-22 MED ORDER — ALPRAZOLAM 0.5 MG PO TABS: ORAL_TABLET | ORAL | Status: DC

## 2010-09-22 NOTE — Progress Notes (Signed)
Subjective:    Patient ID: Dakota [name], male    DOB: 07-Aug-1928, 77 y.o.   MRN: [id number]  HPI Pt here for Comp Exam. He has tried to cut down on fried foods. He has no complaints except for leg discomfort like when he was on the Coumadin. He is taking ASA one a day. He has had a breakout on his arms. He did not fill out the Medicare Forms.  I gave him a script for 30 Xanax and he wants it for 60 at a time...doesn't want to go to the pharmacy so often.   Review of Systems  Constitutional: Negative for fever, chills, diaphoresis, appetite change, fatigue and unexpected weight change.  HENT: Negative for hearing loss, ear pain, tinnitus and ear discharge.   Eyes: Negative for pain, discharge and visual disturbance.       He had a retinal tear treated by bubble therapy and then had cataracts removed. His vision is better but not great.  Respiratory: Negative for cough, shortness of breath and wheezing.   Cardiovascular: Negative for chest pain and palpitations.       No SOB w/ exertion  Gastrointestinal: Negative for nausea, vomiting, abdominal pain, diarrhea, constipation and blood in stool.       No heartburn or swallowing problems.  Genitourinary: Negative for dysuria, frequency and difficulty urinating.       Has mild  Nocturia and mild leakage with running water.  Musculoskeletal: Negative for myalgias, back pain and arthralgias.  Skin: Negative for rash.       No itching or dryness.  Neurological: Negative for tremors and numbness.       No tingling but mild balance problem when first arising..  Hematological: Negative for adenopathy. Does not bruise/bleed easily.  Psychiatric/Behavioral: Negative for dysphoric mood and agitation.       Objective:   Physical Exam  Constitutional: He is oriented to person, place, and time. He appears well-developed and well-nourished. No distress.  HENT:  Head: Normocephalic and atraumatic.  Right Ear: External ear normal.  Left Ear:  External ear normal.  Nose: Nose normal.  Mouth/Throat: Oropharynx is clear and moist.  Eyes: Conjunctivae and EOM are normal. Pupils are equal, round, and reactive to light. Right eye exhibits no discharge. Left eye exhibits no discharge. No scleral icterus.  Neck: Normal range of motion. Neck supple. No thyromegaly present.  Cardiovascular: Normal rate, regular rhythm, normal heart sounds and intact distal pulses.   No murmur heard. Pulmonary/Chest: Effort normal and breath sounds normal. No respiratory distress. He has no wheezes.  Abdominal: Soft. Bowel sounds are normal. He exhibits no distension and no mass. There is no tenderness. There is no rebound and no guarding.  Genitourinary: Rectum normal, prostate normal and penis normal. Guaiac negative stool.  Musculoskeletal: Normal range of motion. He exhibits no edema.  Lymphadenopathy:    He has no cervical adenopathy.  Neurological: He is alert and oriented to person, place, and time. Coordination normal.  Skin: Skin is warm and dry. No rash noted. He is not diaphoretic.  Psychiatric: He has a normal mood and affect. His behavior is normal. Judgment and thought content normal.          Assessment & Plan:  HMPE  He did not do  the Medicare Annual Wellness questionnaire but I have noted 1. The patient's medical and social history 2. Their use of alcohol, tobacco or illicit drugs 3. Their current medications and supplements 4. The patient's functional ability  including ADL's, fall risks, home safety risks and hearing or visual             impairment. 5. Diet and physical activities 6. Evidence for depression or mood disorders

## 2010-09-22 NOTE — Patient Instructions (Signed)
RTC as needed

## 2010-09-22 NOTE — Assessment & Plan Note (Signed)
Stable. BP Readings from Last 3 Encounters:  09/22/10 138/80  08/18/10 138/88  08/11/10 128/84

## 2010-09-22 NOTE — Assessment & Plan Note (Signed)
Euglycemic today. 

## 2010-09-22 NOTE — Assessment & Plan Note (Signed)
Stable. Wants script for 60 xanax at a time not 30.

## 2010-09-22 NOTE — Assessment & Plan Note (Addendum)
Stable, generally assymptomatic.

## 2010-09-22 NOTE — Assessment & Plan Note (Signed)
Stable, CBC stable and no discernible sxs.

## 2010-09-22 NOTE — Assessment & Plan Note (Signed)
Stable. Cont curr meds. Discussed diet. Lab Results  Component Value Date   CHOL 165 09/15/2010   CHOL 172 11/21/2008   CHOL 189 11/21/2007   Lab Results  Component Value Date   HDL 37.90* 09/15/2010   HDL 34.10* 11/21/2008   HDL 32.2* 11/21/2007   Lab Results  Component Value Date   LDLCALC 117* 09/15/2010   LDLCALC 127* 11/21/2008   LDLCALC 143* 11/21/2007   Lab Results  Component Value Date   TRIG 50.0 09/15/2010   TRIG 55.0 11/21/2008   TRIG 71 11/21/2007   Lab Results  Component Value Date   CHOLHDL 4 09/15/2010   CHOLHDL 5 11/21/2008   CHOLHDL 5.9 CALC 11/21/2007   No results found for this basename: LDLDIRECT

## 2010-09-22 NOTE — Assessment & Plan Note (Signed)
Stable after trmt. Had cataract removed as well.

## 2010-09-22 NOTE — Assessment & Plan Note (Signed)
Typically not a problem if not agitated.

## 2010-09-22 NOTE — Assessment & Plan Note (Signed)
Better, cont curr tx.

## 2010-09-29 ENCOUNTER — Other Ambulatory Visit: Payer: Self-pay | Admitting: Cardiovascular Disease

## 2011-02-03 ENCOUNTER — Ambulatory Visit: Payer: Medicare Other | Admitting: Cardiovascular Disease

## 2011-02-17 ENCOUNTER — Ambulatory Visit (INDEPENDENT_AMBULATORY_CARE_PROVIDER_SITE_OTHER): Payer: Medicare Other | Admitting: Cardiovascular Disease

## 2011-02-17 ENCOUNTER — Encounter: Payer: Self-pay | Admitting: Cardiovascular Disease

## 2011-02-17 DIAGNOSIS — I2510 Atherosclerotic heart disease of native coronary artery without angina pectoris: Secondary | ICD-10-CM

## 2011-02-17 DIAGNOSIS — E785 Hyperlipidemia, unspecified: Secondary | ICD-10-CM

## 2011-02-17 DIAGNOSIS — I10 Essential (primary) hypertension: Secondary | ICD-10-CM

## 2011-02-17 DIAGNOSIS — I4891 Unspecified atrial fibrillation: Secondary | ICD-10-CM

## 2011-02-17 MED ORDER — NITROGLYCERIN 0.4 MG SL SUBL: 0.4000 mg | SUBLINGUAL_TABLET | SUBLINGUAL | Status: DC | PRN

## 2011-02-17 MED ORDER — CARVEDILOL 25 MG PO TABS: 25.0000 mg | ORAL_TABLET | Freq: Two times a day (BID) | ORAL | Status: DC

## 2011-02-17 NOTE — Progress Notes (Signed)
Dakota [name] is seen today for f/U of afib and HTN. He was last seen by CM for preopertative clearance in my absence. He has no known CAD. He has chronic afib and has refused to be on coumadin. Forutuanatly he has not had a CVA, TIA or embolic event. His BP is under good control. I discussed Pradaxa with him and he still wishes to be just on asa since he feels so well I encouraged him to F/U with Dr Jerolyn Center as his vision is still a problem  ROS: Denies fever, malais, weight loss, blurry vision, decreased visual acuity, cough, sputum, SOB, hemoptysis, pleuritic pain, palpitaitons, heartburn, abdominal pain, melena, lower extremity edema, claudication, or rash.  All other systems reviewed and negative  General: Affect appropriate Healthy:  appears stated age HEENT: normal Neck supple with no adenopathy JVP normal no bruits no thyromegaly Lungs clear with no wheezing and good diaphragmatic motion Heart:  S1/S2 no murmur,rub, gallop or click PMI normal Abdomen: benighn, BS positve, no tenderness, no AAA no bruit.  No HSM or HJR Distal pulses intact with no bruits No edema Neuro non-focal Skin warm and dry No muscular weakness   Current Outpatient Prescriptions  Medication Sig Dispense Refill  . ALPRAZolam (XANAX) 0.5 MG tablet Take one half by mouth two times a day as needed for anxiety.  60 tablet  3  . aspirin 81 MG EC tablet Take 81 mg by mouth daily.        . calcium carbonate (TUMS) 500 MG chewable tablet as needed.        . carvedilol (COREG) 25 MG tablet TAKE ONE TABLET BY MOUTH TWICE DAILY  60 tablet  6  . Cholecalciferol (VITAMIN D3) 1000 UNITS capsule Take 1,000 Units by mouth daily.          Allergies  Atorvastatin; Diazepam; Ezetimibe; and Penicillins  Electrocardiogram: Afib rate 77 otherwise normal  Assessment and Plan

## 2011-02-17 NOTE — Assessment & Plan Note (Signed)
Asymptomatic good rate control.  Refuses anticagulation

## 2011-02-17 NOTE — Assessment & Plan Note (Signed)
Stable with no angina and good activity level.  Continue medical Rx No critical lesion on cath in 2006

## 2011-02-17 NOTE — Assessment & Plan Note (Signed)
Well controlled.  Continue current medications and low sodium Dash type diet.    

## 2011-02-17 NOTE — Progress Notes (Signed)
Addended by: Scherrie Bateman E on: 02/17/2011 10:43 AM   Modules accepted: Orders

## 2011-02-17 NOTE — Patient Instructions (Signed)
Your physician wants you to follow-up in:  6 MONTHS WITH DR NISHAN  You will receive a reminder letter in the mail two months in advance. If you don't receive a letter, please call our office to schedule the follow-up appointment. Your physician recommends that you continue on your current medications as directed. Please refer to the Current Medication list given to you today. 

## 2011-02-17 NOTE — Assessment & Plan Note (Signed)
Cholesterol is at goal.  Continue current dose of statin and diet Rx.  No myalgias or side effects.  F/U  LFT's in 6 months. Lab Results  Component Value Date   LDLCALC 117* 09/15/2010             

## 2011-03-25 ENCOUNTER — Other Ambulatory Visit: Payer: Self-pay | Admitting: Family Medicine

## 2011-03-25 NOTE — Telephone Encounter (Signed)
Please call in

## 2011-03-28 NOTE — Telephone Encounter (Signed)
Medication phoned to pharmacy.  

## 2011-06-07 ENCOUNTER — Encounter: Payer: Self-pay | Admitting: Family Medicine

## 2011-06-07 ENCOUNTER — Ambulatory Visit (INDEPENDENT_AMBULATORY_CARE_PROVIDER_SITE_OTHER): Payer: Medicare Other | Admitting: Family Medicine

## 2011-06-07 DIAGNOSIS — I10 Essential (primary) hypertension: Secondary | ICD-10-CM

## 2011-06-07 DIAGNOSIS — E785 Hyperlipidemia, unspecified: Secondary | ICD-10-CM | POA: Diagnosis not present

## 2011-06-07 DIAGNOSIS — I4891 Unspecified atrial fibrillation: Secondary | ICD-10-CM | POA: Diagnosis not present

## 2011-06-07 DIAGNOSIS — F411 Generalized anxiety disorder: Secondary | ICD-10-CM

## 2011-06-07 NOTE — Progress Notes (Signed)
AF.  Not on coumadin.  Declined anticoagulation.  No heart racing per patient. We talked about anticoagulation and he'll consider, then f/u with cards.   Hypertension:    Using medication without problems: yes Chest pain with exertion:no Edema: minimal, chronic after R ankle fx, at baseline Short of breath: no Average home BPs: controlled at home per patient Other issues:occ indigestion, responsive to tums  Anxiety.  No ADE from meds.  Good effect.  "If I put the chewing tobacco down, I'll need more nerve pills."  This is likely related to caring for wife. She's in a wheelchair, chronically ill.    Meds, vitals, and allergies reviewed.   PMH and SH reviewed  ROS: See HPI.  Otherwise negative.    GEN: nad, alert and oriented HEENT: mucous membranes moist NECK: supple w/o LA CV: IRR, not tachy PULM: ctab, no inc wob ABD: soft, +bs EXT: trace edema SKIN: no acute rash

## 2011-06-07 NOTE — Patient Instructions (Signed)
Don't change your meds for now and let me know if you have concerns.  Take care.   I would get a flu shot each fall.

## 2011-06-08 NOTE — Assessment & Plan Note (Signed)
He'll consider anticoagulation and will f/u with cards.

## 2011-06-08 NOTE — Assessment & Plan Note (Signed)
Continue as is, no change in meds.  No ADE.  Likely related to caring for wife.  We talked about caregiver stress and support.

## 2011-06-08 NOTE — Assessment & Plan Note (Signed)
No change in meds, I don't want to induce hypotension.  D/w pt.

## 2011-08-08 ENCOUNTER — Other Ambulatory Visit: Payer: Self-pay | Admitting: *Deleted

## 2011-08-08 NOTE — Telephone Encounter (Signed)
Faxed refill request   

## 2011-08-09 ENCOUNTER — Other Ambulatory Visit: Payer: Self-pay | Admitting: *Deleted

## 2011-08-09 MED ORDER — ALPRAZOLAM 0.5 MG PO TABS: ORAL_TABLET | ORAL | Status: DC

## 2011-08-09 NOTE — Telephone Encounter (Signed)
Medication phoned to pharmacy.  

## 2011-08-09 NOTE — Telephone Encounter (Signed)
Please call in

## 2011-08-16 ENCOUNTER — Ambulatory Visit: Payer: Medicare Other | Admitting: Cardiovascular Disease

## 2011-08-31 ENCOUNTER — Encounter: Payer: Self-pay | Admitting: Cardiovascular Disease

## 2011-08-31 ENCOUNTER — Ambulatory Visit (INDEPENDENT_AMBULATORY_CARE_PROVIDER_SITE_OTHER): Payer: Medicare Other | Admitting: Cardiovascular Disease

## 2011-08-31 VITALS — BP 130/82 | HR 80 | Ht 69.0 in | Wt 201.0 lb

## 2011-08-31 DIAGNOSIS — I4891 Unspecified atrial fibrillation: Secondary | ICD-10-CM | POA: Diagnosis not present

## 2011-08-31 DIAGNOSIS — I10 Essential (primary) hypertension: Secondary | ICD-10-CM | POA: Diagnosis not present

## 2011-08-31 DIAGNOSIS — E785 Hyperlipidemia, unspecified: Secondary | ICD-10-CM

## 2011-08-31 NOTE — Assessment & Plan Note (Signed)
Continue to refuse anticoagulaiton.  Rate control fine and no TIAls  Continue current Rx

## 2011-08-31 NOTE — Progress Notes (Signed)
Dakota [name] is seen today for f/U of afib and HTN. Dakota [name] He has no known CAD. He has chronic afib and has refused to be on coumadin. Forutuanatly he has not had a CVA, TIA or embolic event. His BP is under good control. I discussed Pradaxa with him and he still wishes to be just on asa since he feels so well I encouraged him to F/U with Dr Jerolyn Center as his vision is still a problem Wife has bad back problems with parkinsons and is essentially total care.  Encouraged him to look into assisted living for her  ROS: Denies fever, malais, weight loss, blurry vision, decreased visual acuity, cough, sputum, SOB, hemoptysis, pleuritic pain, palpitaitons, heartburn, abdominal pain, melena, lower extremity edema, claudication, or rash.  All other systems reviewed and negative  General: Affect appropriate Healthy:  appears stated age HEENT: normal Neck supple with no adenopathy JVP normal no bruits no thyromegaly Lungs clear with no wheezing and good diaphragmatic motion Heart:  S1/S2 no murmur, no rub, gallop or click PMI normal Abdomen: benighn, BS positve, no tenderness, no AAA no bruit.  No HSM or HJR Distal pulses intact with no bruits No edema Neuro non-focal Skin warm and dry No muscular weakness   Current Outpatient Prescriptions  Medication Sig Dispense Refill  . ALPRAZolam (XANAX) 0.5 MG tablet TAKE ONE-HALF TABLET BY MOUTH TWICE DAILY AS NEEDED FOR ANXIETY  60 tablet  2  . aspirin 81 MG EC tablet Take 81 mg by mouth daily.        . calcium carbonate (TUMS) 500 MG chewable tablet as needed.        . carvedilol (COREG) 25 MG tablet Take 1 tablet (25 mg total) by mouth 2 (two) times daily with a meal.  180 tablet  3  . Cholecalciferol (VITAMIN D3) 1000 UNITS capsule Take 1,000 Units by mouth every other day.       . nitroGLYCERIN (NITROSTAT) 0.4 MG SL tablet Place 1 tablet (0.4 mg total) under the tongue every 5 (five) minutes as needed for chest pain.  90 tablet  1    Allergies  Atorvastatin;  Diazepam; Ezetimibe; and Penicillins  Electrocardiogram: 02/17/11  AFib rate 72 otherwise normal ECG  Assessment and Plan

## 2011-08-31 NOTE — Assessment & Plan Note (Signed)
Well controlled.  Continue current medications and low sodium Dash type diet.    

## 2011-08-31 NOTE — Assessment & Plan Note (Signed)
Cholesterol is at goal.  Continue current dose of statin and diet Rx.  No myalgias or side effects.  F/U  LFT's in 6 months. Lab Results  Component Value Date   LDLCALC 117* 09/15/2010             

## 2011-08-31 NOTE — Patient Instructions (Signed)
Your physician wants you to follow-up in: YEAR WITH DR NISHAN  You will receive a reminder letter in the mail two months in advance. If you don't receive a letter, please call our office to schedule the follow-up appointment.  Your physician recommends that you continue on your current medications as directed. Please refer to the Current Medication list given to you today. 

## 2012-02-27 ENCOUNTER — Other Ambulatory Visit: Payer: Self-pay | Admitting: Cardiovascular Disease

## 2012-03-12 ENCOUNTER — Other Ambulatory Visit: Payer: Self-pay | Admitting: *Deleted

## 2012-03-12 MED ORDER — ALPRAZOLAM 0.5 MG PO TABS: ORAL_TABLET | ORAL | Status: DC

## 2012-03-12 NOTE — Telephone Encounter (Signed)
Please call in

## 2012-03-13 NOTE — Telephone Encounter (Signed)
Medication phoned to pharmacy.  

## 2012-06-21 ENCOUNTER — Encounter (HOSPITAL_COMMUNITY): Payer: Self-pay | Admitting: Emergency Medicine

## 2012-06-21 ENCOUNTER — Emergency Department (INDEPENDENT_AMBULATORY_CARE_PROVIDER_SITE_OTHER)
Admission: EM | Admit: 2012-06-21 | Discharge: 2012-06-21 | Disposition: A | Discharge status: Home / Self Care | Payer: Medicare Other | Source: Home / Self Care | Attending: Family Medicine | Admitting: Family Medicine

## 2012-06-21 ENCOUNTER — Telehealth: Payer: Self-pay | Admitting: Family Medicine

## 2012-06-21 DIAGNOSIS — K219 Gastro-esophageal reflux disease without esophagitis: Secondary | ICD-10-CM

## 2012-06-21 MED ORDER — RANITIDINE HCL 150 MG PO TABS: 150.0000 mg | ORAL_TABLET | Freq: Two times a day (BID) | ORAL | Status: DC

## 2012-06-21 MED ORDER — GI COCKTAIL ~~LOC~~
30.0000 mL | Freq: Once | ORAL | Status: AC
  Administered 2012-06-21: 30 mL via ORAL

## 2012-06-21 MED ORDER — GI COCKTAIL ~~LOC~~
ORAL | Status: AC
  Filled 2012-06-21: qty 30

## 2012-06-21 NOTE — ED Provider Notes (Signed)
History     CSN: [id number]  Arrival date & time 06/21/12  1802   First MD Initiated Contact with Patient 06/21/12 1803      Chief Complaint  Patient presents with  . Abdominal Pain    epigastric pain since last night. pain is constant.     (Consider location/radiation/quality/duration/timing/severity/associated sxs/prior treatment) Patient is a 79 y.o. male presenting with chest pain. The history is provided by the patient and a relative.  Chest Pain Pain location:  Epigastric Pain quality: burning   Pain radiates to:  Does not radiate Pain radiates to the back: no   Pain severity:  Mild Onset quality:  Gradual (caring for wife in nh situation , stressful, sx began last eve, continue unchanged.) Duration:  1 day Progression:  Unchanged Chronicity:  New Relieved by:  Nothing Associated symptoms: heartburn   Associated symptoms: no cough, no fever, no nausea, no shortness of breath and not vomiting   Risk factors: coronary artery disease   Risk factors comment:  Genella Rife and esophageal stricture.   Past Medical History  Diagnosis Date  . GERD (gastroesophageal reflux disease) 10/2002  . Hyperlipidemia 12/1994  . Hypertension   . Chronic atrial fibrillation     not on coumadin therapy  . Back pain, chronic   . Lung collapse 03/1991    Fall    Past Surgical History  Procedure Laterality Date  . Back surgery  11/91    with hardware fixation  . Cardiac catheterization  06/2004    30 % stenosis, EF normal  . Eye surgery      Retinal bubble surgery, cataract     Family History  Problem Relation Age of Onset  . Pneumonia Mother   . Hip fracture Mother   . Cancer Father     splenic  . Heart disease Sister     Pacer placed  . Cancer Brother     throat and lung  . Heart disease Son   . Stroke Neg Hx   . Cancer Brother     prostate, age 51  . Prostate cancer Brother     History  Substance Use Topics  . Smoking status: Former Smoker -- 1.00 packs/day for 15  years    Quit date: 05/02/1953  . Smokeless tobacco: Current User    Types: Chew     Comment: Quit smoking over 50 years ago  . Alcohol Use: No      Review of Systems  Constitutional: Negative.  Negative for fever.  Respiratory: Negative for cough and shortness of breath.   Cardiovascular: Positive for chest pain.  Gastrointestinal: Positive for heartburn. Negative for nausea and vomiting.    Allergies  Atorvastatin; Diazepam; Ezetimibe; and Penicillins  Home Medications   Current Outpatient Rx  Name  Route  Sig  Dispense  Refill  . ALPRAZolam (XANAX) 0.5 MG tablet      TAKE ONE-HALF TABLET BY MOUTH TWICE DAILY AS NEEDED FOR ANXIETY   60 tablet   2   . aspirin 81 MG EC tablet   Oral   Take 81 mg by mouth daily.           . calcium carbonate (TUMS) 500 MG chewable tablet      as needed.           . carvedilol (COREG) 25 MG tablet      TAKE ONE TABLET BY MOUTH TWICE DAILY WITH MEALS   180 tablet   2   . Cholecalciferol (  VITAMIN D3) 1000 UNITS capsule   Oral   Take 1,000 Units by mouth every other day.          [name] EXPIRED: nitroGLYCERIN (NITROSTAT) 0.4 MG SL tablet   Sublingual   Place 1 tablet (0.4 mg total) under the tongue every 5 (five) minutes as needed for chest pain.   90 tablet   1   . ranitidine (ZANTAC) 150 MG tablet   Oral   Take 1 tablet (150 mg total) by mouth 2 (two) times daily.   60 tablet   0     BP 165/93  Pulse 86  Temp(Src) 97.6 F (36.4 C) (Oral)  Resp 16  SpO2 98%  Physical Exam  Nursing note and vitals reviewed. Constitutional: He is oriented to person, place, and time. He appears well-developed and well-nourished.  HENT:  Head: Normocephalic.  Right Ear: External ear normal.  Left Ear: External ear normal.  Mouth/Throat: Oropharynx is clear and moist.  Neck: Normal range of motion. Neck supple.  Cardiovascular: Normal heart sounds, intact distal pulses and normal pulses.  An irregularly irregular rhythm present.   Pulmonary/Chest: Effort normal and breath sounds normal.  Abdominal: Soft. Bowel sounds are normal. He exhibits no distension and no mass. There is tenderness in the epigastric area. There is no rigidity, no rebound and no guarding.  Lymphadenopathy:    He has no cervical adenopathy.  Neurological: He is alert and oriented to person, place, and time.  Skin: Skin is warm and dry.    ED Course  Procedures (including critical care time)  Labs Reviewed - No data to display No results found.   1. GERD (gastroesophageal reflux disease)       MDM  ecg--a fib, controlled, prev known. Sx improved with gi cocktail.        Linna Hoff, MD 06/21/12 [phone]

## 2012-06-21 NOTE — ED Notes (Signed)
Pt c/o epigastric pain that started last night and is a constant dull ache. Pt denies sob and chest pain.  Pt states that he took tums with no relief of symptoms.

## 2012-06-21 NOTE — Telephone Encounter (Signed)
Patient Information:  Caller Name: [name]/ Son  Phone: [phone]  Patient: Dakota [name], Dakota [name]  Gender: Male  DOB: 17-Jun-1928  Age: 79 Years  PCP: Laurita Quint (retired)  Office Follow Up:  Does the office need to follow up with this patient?: Yes  Instructions For The Office: Patient sent to ER for Chest pain  RN Note:  ADvised ER/911 . Son expressed understanding.  Symptoms  Reason For Call & Symptoms: Son is calling concerning his father.  Called his son today complaining of Mid sternal chest pain since last night.  Treated himself with OTC gas reliever.  B/p 180/120. No shortness of breath. Constant pressure.  Reviewed Health History In EMR: Yes  Reviewed Medications In EMR: Yes  Reviewed Allergies In EMR: Yes  Reviewed Surgeries / Procedures: No  Date of Onset of Symptoms: 06/20/2012  Treatments Tried: Gas medication  Treatments Tried Worked: No  Guideline(s) Used:  Chest Pain  Disposition Per Guideline:   Call EMS 911 Now  Reason For Disposition Reached:   Chest pain lasting longer than 5 minutes and ANY of the following:  Over 25 years old Over 71 years old and at least one cardiac risk factor (i.e., high blood pressure, diabetes, high cholesterol, obesity, smoker or strong family history of heart disease) Pain is crushing, pressure-like, or heavy  Took nitroglycerin and chest pain was not relieved History of heart disease (i.e., angina, heart attack, bypass surgery, angioplasty, CHF)  Advice Given:  N/A

## 2012-06-22 NOTE — Telephone Encounter (Signed)
Correction- I have notes via EMR.

## 2012-06-22 NOTE — Telephone Encounter (Signed)
Agree with ER.  Please try to get ER notes if going to [facility].  Thanks.

## 2012-09-27 ENCOUNTER — Ambulatory Visit (INDEPENDENT_AMBULATORY_CARE_PROVIDER_SITE_OTHER): Payer: Medicare Other | Admitting: Cardiovascular Disease

## 2012-09-27 ENCOUNTER — Encounter: Payer: Self-pay | Admitting: Cardiovascular Disease

## 2012-09-27 VITALS — BP 140/85 | HR 86 | Ht 69.0 in | Wt 199.0 lb

## 2012-09-27 DIAGNOSIS — I4891 Unspecified atrial fibrillation: Secondary | ICD-10-CM

## 2012-09-27 DIAGNOSIS — E785 Hyperlipidemia, unspecified: Secondary | ICD-10-CM

## 2012-09-27 DIAGNOSIS — I10 Essential (primary) hypertension: Secondary | ICD-10-CM | POA: Diagnosis not present

## 2012-09-27 NOTE — Assessment & Plan Note (Signed)
Cholesterol is at goal.  Continue current dose of statin and diet Rx.  No myalgias or side effects.  F/U  LFT's in 6 months. Lab Results  Component Value Date   LDLCALC 117* 09/15/2010

## 2012-09-27 NOTE — Progress Notes (Signed)
Patient ID: Dakota [name], male   DOB: 02-08-1929, 79 y.o.   MRN: [id number] Jaret is seen today for f/U of afib and HTN. [name] He has no known CAD. He has chronic afib and has refused to be on coumadin. Forutuanatly he has not had a CVA, TIA or embolic event. His BP is under good control. I discussed Pradaxa with him and he still wishes to be just on asa since he feels so well I encouraged him to F/U with Dr Jerolyn Center as his vision is still a problem Wife has bad back problems with parkinsons and is essentially total care. Encouraged him to look into assisted living for her  ROS: Denies fever, malais, weight loss, blurry vision, decreased visual acuity, cough, sputum, SOB, hemoptysis, pleuritic pain, palpitaitons, heartburn, abdominal pain, melena, lower extremity edema, claudication, or rash.  All other systems reviewed and negative  General: Affect appropriate Healthy:  appears stated age HEENT: normal Neck supple with no adenopathy JVP normal no bruits no thyromegaly Lungs clear with no wheezing and good diaphragmatic motion Heart:  S1/S2 no murmur, no rub, gallop or click PMI normal Abdomen: benighn, BS positve, no tenderness, no AAA no bruit.  No HSM or HJR Distal pulses intact with no bruits No edema Neuro non-focal Skin warm and dry No muscular weakness   Current Outpatient Prescriptions  Medication Sig Dispense Refill  . ALPRAZolam (XANAX) 0.5 MG tablet TAKE ONE-HALF TABLET BY MOUTH TWICE DAILY AS NEEDED FOR ANXIETY  60 tablet  2  . aspirin 81 MG EC tablet Take 81 mg by mouth daily.        . carvedilol (COREG) 25 MG tablet TAKE ONE TABLET BY MOUTH TWICE DAILY WITH MEALS  180 tablet  2  . nitroGLYCERIN (NITROSTAT) 0.4 MG SL tablet Place 1 tablet (0.4 mg total) under the tongue every 5 (five) minutes as needed for chest pain.  90 tablet  1  . ranitidine (ZANTAC) 150 MG tablet Take 1 tablet (150 mg total) by mouth 2 (two) times daily.  60 tablet  0   No current facility-administered  medications for this visit.    Allergies  Atorvastatin; Diazepam; Ezetimibe; and Penicillins  Electrocardiogram:  06/21/12  afib rate 94 otherwise normal  Assessment and Plan

## 2012-09-27 NOTE — Assessment & Plan Note (Signed)
Well controlled.  Continue current medications and low sodium Dash type diet.    

## 2012-09-27 NOTE — Assessment & Plan Note (Signed)
Good rate control Refuses anticoagulation Stable

## 2012-10-14 ENCOUNTER — Other Ambulatory Visit: Payer: Self-pay | Admitting: Family Medicine

## 2012-10-14 DIAGNOSIS — I4891 Unspecified atrial fibrillation: Secondary | ICD-10-CM

## 2012-10-14 DIAGNOSIS — I10 Essential (primary) hypertension: Secondary | ICD-10-CM

## 2012-10-18 ENCOUNTER — Encounter: Payer: Self-pay | Admitting: *Deleted

## 2012-10-19 ENCOUNTER — Other Ambulatory Visit (INDEPENDENT_AMBULATORY_CARE_PROVIDER_SITE_OTHER): Payer: Medicare Other

## 2012-10-19 DIAGNOSIS — I4891 Unspecified atrial fibrillation: Secondary | ICD-10-CM | POA: Diagnosis not present

## 2012-10-19 DIAGNOSIS — I10 Essential (primary) hypertension: Secondary | ICD-10-CM

## 2012-10-19 LAB — TSH: TSH: 2.07 u[IU]/mL (ref 0.35–5.50)

## 2012-10-19 LAB — COMPREHENSIVE METABOLIC PANEL
ALT: 17 U/L (ref 0–53)
AST: 17 U/L (ref 0–37)
Alkaline Phosphatase: 49 U/L (ref 39–117)
CO2: 29 mEq/L (ref 19–32)
Creatinine, Ser: 1.1 mg/dL (ref 0.4–1.5)
Sodium: 139 mEq/L (ref 135–145)
Total Bilirubin: 1.1 mg/dL (ref 0.3–1.2)
Total Protein: 7.8 g/dL (ref 6.0–8.3)

## 2012-10-19 LAB — LIPID PANEL
HDL: 42.2 mg/dL (ref >39.00)
Total CHOL/HDL Ratio: 5
VLDL: 15.2 mg/dL (ref 0.0–40.0)

## 2012-10-19 LAB — LDL CHOLESTEROL, DIRECT: Direct LDL: 155.7 mg/dL

## 2012-10-25 ENCOUNTER — Encounter: Payer: Self-pay | Admitting: Radiology

## 2012-10-26 ENCOUNTER — Encounter: Payer: Self-pay | Admitting: Family Medicine

## 2012-10-26 ENCOUNTER — Ambulatory Visit (INDEPENDENT_AMBULATORY_CARE_PROVIDER_SITE_OTHER): Payer: Medicare Other | Admitting: Family Medicine

## 2012-10-26 VITALS — BP 130/88 | HR 75 | Temp 97.5°F | Ht 69.0 in | Wt 199.2 lb

## 2012-10-26 DIAGNOSIS — F411 Generalized anxiety disorder: Secondary | ICD-10-CM | POA: Diagnosis not present

## 2012-10-26 DIAGNOSIS — I4891 Unspecified atrial fibrillation: Secondary | ICD-10-CM

## 2012-10-26 DIAGNOSIS — Z0000 Encounter for general adult medical examination without abnormal findings: Secondary | ICD-10-CM

## 2012-10-26 DIAGNOSIS — R7309 Other abnormal glucose: Secondary | ICD-10-CM | POA: Diagnosis not present

## 2012-10-26 MED ORDER — RANITIDINE HCL 150 MG PO TABS: 150.0000 mg | ORAL_TABLET | Freq: Two times a day (BID) | ORAL | Status: DC

## 2012-10-26 NOTE — Patient Instructions (Addendum)
Don't change your meds for now.  Call with questions.  Take care.  Glad to see you.  Cut back on sweets and if your sugar is usually above 100 in the mornings before you eat, let me know.

## 2012-10-28 ENCOUNTER — Encounter: Payer: Self-pay | Admitting: Family Medicine

## 2012-10-28 DIAGNOSIS — Z0000 Encounter for general adult medical examination without abnormal findings: Secondary | ICD-10-CM | POA: Insufficient documentation

## 2012-10-28 NOTE — Assessment & Plan Note (Signed)
Appears stable, declines anticoagulation.  D/w pt re: risk and benefit.

## 2012-10-28 NOTE — Progress Notes (Signed)
I have personally reviewed the Medicare Annual Wellness questionnaire and have noted 1. The patient's medical and social history 2. Their use of alcohol, tobacco or illicit drugs 3. Their current medications and supplements 4. The patient's functional ability including ADL's, fall risks, home safety risks and hearing or visual             impairment. 5. Diet and physical activities 6. Evidence for depression or mood disorders  The patients weight, height, BMI have been recorded in the chart and visual acuity is per eye clinic.  I have made referrals, counseling and provided education to the patient based review of the above and I have provided the pt with a written personalized care plan for preventive services.  See scanned forms.  Routine anticipatory guidance given to patient.  See health maintenance. Flu encouraged.  Shingles encouraged PNA encouraged Tetanus 2009 Colonoscopy declined.  This is reasonable given his age.  Prostate cancer screening and PSA options (with potential risks and benefits of testing vs not testing) were discussed along with recent recs/guidelines.  He declined testing PSA at this point and this is reasonable.  Advance directive d/w pt.  Would have his wife, kids designated if incapacitated.  Cognitive function addressed- see scanned forms- and if abnormal then additional documentation follows.   Hyperglycemia d/w pt. Discussed and he'll check this at home.   Anxiety, related to caring for wife.  Occ BZD use. No ADE.  He has some in home help.   AF, no CP SOB BLE edema.  Declined anticoagulation, discussed risks and benefits.    PMH and SH reviewed  Meds, vitals, and allergies reviewed.   ROS: See HPI.  Otherwise negative.    GEN: nad, alert and oriented HEENT: mucous membranes moist NECK: supple w/o LA CV: IRR PULM: ctab, no inc wob ABD: soft, +bs EXT: no edema SKIN: no acute rash

## 2012-10-28 NOTE — Assessment & Plan Note (Signed)
Controlled with PRN use of BZD w/o ADE.  Continue as is.  He has some support at home.

## 2012-10-28 NOTE — Assessment & Plan Note (Signed)
See scanned forms.  Routine anticipatory guidance given to patient.  See health maintenance. Flu encouraged.  Shingles encouraged PNA encouraged Tetanus 2009 Colonoscopy declined.  This is reasonable given his age.  Prostate cancer screening and PSA options (with potential risks and benefits of testing vs not testing) were discussed along with recent recs/guidelines.  He declined testing PSA at this point and this is reasonable.  Advance directive d/w pt.  Would have his wife, kids designated if incapacitated.  Cognitive function addressed- see scanned forms- and if abnormal then additional documentation follows.

## 2012-10-28 NOTE — Assessment & Plan Note (Signed)
He'll check his sugar at home and notify us if elevated.

## 2012-11-29 ENCOUNTER — Other Ambulatory Visit: Payer: Self-pay

## 2012-11-29 MED ORDER — CARVEDILOL 25 MG PO TABS: ORAL_TABLET | ORAL | Status: DC

## 2013-03-15 ENCOUNTER — Other Ambulatory Visit: Payer: Self-pay | Admitting: Family Medicine

## 2013-03-15 NOTE — Telephone Encounter (Signed)
Electronic refill request.  Please advise. 

## 2013-03-17 NOTE — Telephone Encounter (Signed)
Please call in

## 2013-03-18 NOTE — Telephone Encounter (Signed)
Medication phoned to pharmacy.  

## 2013-03-26 ENCOUNTER — Encounter: Payer: Self-pay | Admitting: Cardiovascular Disease

## 2013-03-26 ENCOUNTER — Ambulatory Visit (INDEPENDENT_AMBULATORY_CARE_PROVIDER_SITE_OTHER): Payer: Medicare Other | Admitting: Cardiovascular Disease

## 2013-03-26 VITALS — BP 140/80 | HR 79 | Ht 69.0 in | Wt 201.0 lb

## 2013-03-26 DIAGNOSIS — E785 Hyperlipidemia, unspecified: Secondary | ICD-10-CM

## 2013-03-26 DIAGNOSIS — I2510 Atherosclerotic heart disease of native coronary artery without angina pectoris: Secondary | ICD-10-CM

## 2013-03-26 DIAGNOSIS — I4891 Unspecified atrial fibrillation: Secondary | ICD-10-CM

## 2013-03-26 DIAGNOSIS — I10 Essential (primary) hypertension: Secondary | ICD-10-CM

## 2013-03-26 NOTE — Progress Notes (Signed)
Patient ID: Dakota [name], male   DOB: June 06, 1928, 79 y.o.   MRN: [id number] Dakota [name] is seen today for f/U of afib and HTN. [name] He has no known CAD. He has chronic afib and has refused to be on coumadin. Forutuanatly he has not had a CVA, TIA or embolic event. His BP is under good control. I discussed Pradaxa with him and he still wishes to be just on asa since he feels so well I encouraged him to F/U with Dr Jerolyn Center as his vision is still a problem Wife has bad back problems with parkinsons and is essentially total care. Encouraged him to look into assisted living for her  He tried this for 2 months but didn't think they looked after her well    ROS: Denies fever, malais, weight loss, blurry vision, decreased visual acuity, cough, sputum, SOB, hemoptysis, pleuritic pain, palpitaitons, heartburn, abdominal pain, melena, lower extremity edema, claudication, or rash.  All other systems reviewed and negative  General: Affect appropriate Healthy:  appears stated age HEENT: normal Neck supple with no adenopathy JVP normal no bruits no thyromegaly Lungs clear with no wheezing and good diaphragmatic motion Heart:  S1/S2 no murmur, no rub, gallop or click PMI normal Abdomen: benighn, BS positve, no tenderness, no AAA no bruit.  No HSM or HJR Distal pulses intact with no bruits No edema Neuro non-focal Skin warm and dry No muscular weakness   Current Outpatient Prescriptions  Medication Sig Dispense Refill  . ALPRAZolam (XANAX) 0.5 MG tablet TAKE ONE-HALF TABLET BY MOUTH TWICE DAILY AS NEEDED FOR ANXIETY  60 tablet  2  . aspirin 81 MG EC tablet Take 81 mg by mouth daily.        . calcium carbonate (TUMS - DOSED IN MG ELEMENTAL CALCIUM) 500 MG chewable tablet Chew 1 tablet by mouth daily.      . carvedilol (COREG) 25 MG tablet TAKE ONE TABLET BY MOUTH TWICE DAILY WITH MEALS  180 tablet  2  . ranitidine (ZANTAC) 150 MG tablet Take 1 tablet (150 mg total) by mouth 2 (two) times daily.  180 tablet  3    No current facility-administered medications for this visit.    Allergies  Atorvastatin; Diazepam; Ezetimibe; and Penicillins  Electrocardiogram: 06/21/12  afib rate 90 otherwise normal  Assessment and Plan

## 2013-03-26 NOTE — Assessment & Plan Note (Signed)
Cholesterol is at goal.  Continue current dose of statin and diet Rx.  No myalgias or side effects.  F/U  LFT's in 6 months. Lab Results  Component Value Date   LDLCALC 117* 09/15/2010

## 2013-03-26 NOTE — Assessment & Plan Note (Signed)
Stable with no angina and good activity level.  Continue medical Rx  

## 2013-03-26 NOTE — Assessment & Plan Note (Signed)
Well controlled.  Continue current medications and low sodium Dash type diet.    

## 2013-03-26 NOTE — Patient Instructions (Signed)
Your physician wants you to follow-up in: YEAR WITH DR NISHAN  You will receive a reminder letter in the mail two months in advance. If you don't receive a letter, please call our office to schedule the follow-up appointment.  Your physician recommends that you continue on your current medications as directed. Please refer to the Current Medication list given to you today. 

## 2013-03-26 NOTE — Assessment & Plan Note (Signed)
Good rate control Discussed novel agents as alternative to coumadin He is still not interested.

## 2013-04-10 ENCOUNTER — Ambulatory Visit (INDEPENDENT_AMBULATORY_CARE_PROVIDER_SITE_OTHER): Payer: Medicare Other | Admitting: Family Medicine

## 2013-04-10 ENCOUNTER — Encounter: Payer: Self-pay | Admitting: Family Medicine

## 2013-04-10 VITALS — BP 148/88 | HR 84 | Temp 98.1°F | Wt 202.0 lb

## 2013-04-10 DIAGNOSIS — R7309 Other abnormal glucose: Secondary | ICD-10-CM

## 2013-04-10 DIAGNOSIS — L723 Sebaceous cyst: Secondary | ICD-10-CM

## 2013-04-10 NOTE — Progress Notes (Signed)
Pre-visit discussion using our clinic review tool. No additional management support is needed unless otherwise documented below in the visit note.  He needs some strips for his meter.  He has been checking his sugar episodically.  None >125 at home, most were <100.    Cyst on neck.  Unclear if it drained recently, possibly.  It was puffy but then it was suddenly much smaller.  It was drained prev by a MD at an outside clinic in distant past.    Meds, vitals, and allergies reviewed.   ROS: See HPI.  Otherwise, noncontributory.  nad Lesion on the L side of posterior neck.  Small nonirritated seb cyst.   No other mass or LA

## 2013-04-10 NOTE — Patient Instructions (Signed)
This looks like a small cyst on the back of your neck.  If it gets really sore or a lot bigger then I can work on it.  Take care.

## 2013-04-11 DIAGNOSIS — L723 Sebaceous cyst: Secondary | ICD-10-CM | POA: Insufficient documentation

## 2013-04-11 NOTE — Assessment & Plan Note (Signed)
rx written for strips.

## 2013-04-11 NOTE — Assessment & Plan Note (Addendum)
Small, <1cm, not irritated.  Wouldn't remove given the size.  If enlarged or tender, we'll drain.  He agrees.  F/u prn.  No charge.

## 2013-08-21 ENCOUNTER — Other Ambulatory Visit: Payer: Self-pay | Admitting: Cardiovascular Disease

## 2013-11-29 ENCOUNTER — Other Ambulatory Visit: Payer: Self-pay | Admitting: Family Medicine

## 2013-11-29 NOTE — Telephone Encounter (Signed)
Electronic refill request.  Last Filled:  60 tablet 2 RF on  03/15/2013.  Please advise.

## 2013-11-30 NOTE — Telephone Encounter (Signed)
Please call in.  Thanks.   

## 2013-12-02 NOTE — Telephone Encounter (Signed)
Medication phoned to pharmacy.  

## 2014-02-18 ENCOUNTER — Other Ambulatory Visit: Payer: Self-pay | Admitting: Cardiovascular Disease

## 2014-03-30 NOTE — Progress Notes (Signed)
Patient ID: Dakota [name], male   DOB: 09-28-1928, 80 y.o.   MRN: [id number] Dakota [name] is seen today for f/U of afib and HTN. [name] He has no known CAD. He has chronic afib and has refused to be on coumadin. Forutuanatly he has not had a CVA, TIA or embolic event. His BP is under good control. I discussed Pradaxa with him and he still wishes to be just on asa since he feels so well I encouraged him to F/U with Dr Rodena Piety as his vision is still a problem Wife has bad back problems with parkinsons and is essentially total care. Encouraged him to look into assisted living for her He tried this for 2 months but didn't think they looked after her well    Has a "boil" on his neck that needs to be excised    ROS: Denies fever, malais, weight loss, blurry vision, decreased visual acuity, cough, sputum, SOB, hemoptysis, pleuritic pain, palpitaitons, heartburn, abdominal pain, melena, lower extremity edema, claudication, or rash.  All other systems reviewed and negative  General: Affect appropriate Healthy:  appears stated age 26: normal Neck supple with no adenopathy firm moderate sized sebacious cycst on neck  JVP normal no bruits no thyromegaly Lungs clear with no wheezing and good diaphragmatic motion Heart:  S1/S2 no murmur, no rub, gallop or click PMI normal Abdomen: benighn, BS positve, no tenderness, no AAA no bruit.  No HSM or HJR Distal pulses intact with no bruits No edema Neuro non-focal Skin warm and dry No muscular weakness   Current Outpatient Prescriptions  Medication Sig Dispense Refill  . ALPRAZolam (XANAX) 0.5 MG tablet TAKE ONE-HALF TABLET BY MOUTH TWICE DAILY AS NEEDED FOR ANXIETY 60 tablet 2  . aspirin 81 MG EC tablet Take 81 mg by mouth daily.      . calcium carbonate (TUMS - DOSED IN MG ELEMENTAL CALCIUM) 500 MG chewable tablet Chew 1 tablet by mouth daily as needed.     . carvedilol (COREG) 25 MG tablet TAKE ONE TABLET BY MOUTH TWICE DAILY WITH MEALS 180 tablet 0  .  ranitidine (ZANTAC) 150 MG tablet Take 150 mg by mouth daily.     No current facility-administered medications for this visit.    Allergies  Atorvastatin; Diazepam; Ezetimibe; and Penicillins  Electrocardiogram:  06/21/13  afib rate 96  Otherwise normal today afib rate 86 nonspecific lateral T wave changes   Assessment and Plan

## 2014-03-31 ENCOUNTER — Encounter: Payer: Self-pay | Admitting: Cardiovascular Disease

## 2014-03-31 ENCOUNTER — Ambulatory Visit (INDEPENDENT_AMBULATORY_CARE_PROVIDER_SITE_OTHER): Payer: Medicare Other | Admitting: Cardiovascular Disease

## 2014-03-31 VITALS — BP 130/80 | HR 86 | Ht 69.0 in | Wt 198.0 lb

## 2014-03-31 DIAGNOSIS — I481 Persistent atrial fibrillation: Secondary | ICD-10-CM | POA: Diagnosis not present

## 2014-03-31 DIAGNOSIS — I2510 Atherosclerotic heart disease of native coronary artery without angina pectoris: Secondary | ICD-10-CM | POA: Diagnosis not present

## 2014-03-31 DIAGNOSIS — I482 Chronic atrial fibrillation, unspecified: Secondary | ICD-10-CM

## 2014-03-31 DIAGNOSIS — I4819 Other persistent atrial fibrillation: Secondary | ICD-10-CM

## 2014-03-31 DIAGNOSIS — I10 Essential (primary) hypertension: Secondary | ICD-10-CM

## 2014-03-31 DIAGNOSIS — L723 Sebaceous cyst: Secondary | ICD-10-CM | POA: Diagnosis not present

## 2014-03-31 NOTE — Patient Instructions (Signed)
Your physician wants you to follow-up in:   [place] will receive a reminder letter in the mail two months in advance. If you don't receive a letter, please call our office to schedule the follow-up appointment. Your physician recommends that you continue on your current medications as directed. Please refer to the Current Medication list given to you today.   DR DAN  Ronnald Ramp   [phone]  DERMATOLOGIST

## 2014-03-31 NOTE — Assessment & Plan Note (Signed)
Referred to Dr Wilhemina Bonito [facility]

## 2014-03-31 NOTE — Assessment & Plan Note (Signed)
Good rate control on beta blocker Refuses anticoagulation No TIA / stroke symptoms continue ASA

## 2014-03-31 NOTE — Assessment & Plan Note (Signed)
Well controlled.  Continue current medications and low sodium Dash type diet.    

## 2014-03-31 NOTE — Assessment & Plan Note (Signed)
Stable with no angina and good activity level.  Continue medical Rx  

## 2014-04-30 DIAGNOSIS — D239 Other benign neoplasm of skin, unspecified: Secondary | ICD-10-CM | POA: Diagnosis not present

## 2014-04-30 DIAGNOSIS — L723 Sebaceous cyst: Secondary | ICD-10-CM | POA: Diagnosis not present

## 2014-05-19 ENCOUNTER — Other Ambulatory Visit: Payer: Self-pay | Admitting: Cardiovascular Disease

## 2014-06-19 DIAGNOSIS — L723 Sebaceous cyst: Secondary | ICD-10-CM | POA: Diagnosis not present

## 2014-06-19 DIAGNOSIS — D239 Other benign neoplasm of skin, unspecified: Secondary | ICD-10-CM | POA: Diagnosis not present

## 2014-06-24 ENCOUNTER — Telehealth: Payer: Self-pay | Admitting: Family Medicine

## 2014-06-24 NOTE — Telephone Encounter (Signed)
Electronic refill request.   Patient not seen since 04/2013.    Last Filled:    60 tablet 2RF                           On 11/30/2013  Please advise.

## 2014-06-25 ENCOUNTER — Other Ambulatory Visit: Payer: Self-pay | Admitting: Family Medicine

## 2014-06-25 NOTE — Telephone Encounter (Signed)
Please call in and schedule CPE for summer 2016.  Thanks.

## 2014-06-25 NOTE — Telephone Encounter (Signed)
Rx called to pharmacy as instructed.  Please scheduled CPE as instructed.

## 2014-06-26 NOTE — Telephone Encounter (Signed)
Spoke with pt   Pt stated he would call back to schedule appointment

## 2014-06-30 ENCOUNTER — Other Ambulatory Visit: Payer: Self-pay | Admitting: Family Medicine

## 2014-06-30 ENCOUNTER — Encounter: Payer: Self-pay | Admitting: *Deleted

## 2014-06-30 ENCOUNTER — Other Ambulatory Visit (INDEPENDENT_AMBULATORY_CARE_PROVIDER_SITE_OTHER): Payer: Medicare Other

## 2014-06-30 DIAGNOSIS — I10 Essential (primary) hypertension: Secondary | ICD-10-CM

## 2014-06-30 DIAGNOSIS — Z79899 Other long term (current) drug therapy: Secondary | ICD-10-CM | POA: Diagnosis not present

## 2014-06-30 LAB — LIPID PANEL
CHOLESTEROL: 190 mg/dL (ref 0–200)
HDL: 40.4 mg/dL (ref >39.00)
LDL Cholesterol: 136 mg/dL — ABNORMAL HIGH (ref 0–99)
NonHDL: 149.6
TRIGLYCERIDES: 70 mg/dL (ref 0.0–149.0)
Total CHOL/HDL Ratio: 5
VLDL: 14 mg/dL (ref 0.0–40.0)

## 2014-06-30 LAB — COMPREHENSIVE METABOLIC PANEL
ALBUMIN: 3.9 g/dL (ref 3.5–5.2)
ALT: 13 U/L (ref 0–53)
AST: 14 U/L (ref 0–37)
Alkaline Phosphatase: 55 U/L (ref 39–117)
BUN: 18 mg/dL (ref 6–23)
CALCIUM: 9.6 mg/dL (ref 8.4–10.5)
CO2: 26 mEq/L (ref 19–32)
Chloride: 105 mEq/L (ref 96–112)
Creatinine, Ser: 1.07 mg/dL (ref 0.40–1.50)
GFR: 70.07 mL/min (ref >60.00)
Glucose, Bld: 114 mg/dL — ABNORMAL HIGH (ref 70–99)
POTASSIUM: 4.2 meq/L (ref 3.5–5.1)
Sodium: 138 mEq/L (ref 135–145)
Total Bilirubin: 1 mg/dL (ref 0.2–1.2)
Total Protein: 7.3 g/dL (ref 6.0–8.3)

## 2014-07-01 ENCOUNTER — Telehealth: Payer: Self-pay | Admitting: Family Medicine

## 2014-07-01 NOTE — Telephone Encounter (Signed)
emmi mailed  °

## 2014-07-03 ENCOUNTER — Encounter: Payer: Self-pay | Admitting: Family Medicine

## 2014-07-03 ENCOUNTER — Ambulatory Visit (INDEPENDENT_AMBULATORY_CARE_PROVIDER_SITE_OTHER): Payer: Medicare Other | Admitting: Family Medicine

## 2014-07-03 VITALS — BP 132/82 | HR 84 | Temp 97.8°F | Ht 68.5 in | Wt 197.5 lb

## 2014-07-03 DIAGNOSIS — F411 Generalized anxiety disorder: Secondary | ICD-10-CM | POA: Diagnosis not present

## 2014-07-03 DIAGNOSIS — R739 Hyperglycemia, unspecified: Secondary | ICD-10-CM | POA: Diagnosis not present

## 2014-07-03 DIAGNOSIS — I482 Chronic atrial fibrillation, unspecified: Secondary | ICD-10-CM

## 2014-07-03 DIAGNOSIS — L723 Sebaceous cyst: Secondary | ICD-10-CM

## 2014-07-03 DIAGNOSIS — Z0000 Encounter for general adult medical examination without abnormal findings: Secondary | ICD-10-CM

## 2014-07-03 DIAGNOSIS — E785 Hyperlipidemia, unspecified: Secondary | ICD-10-CM

## 2014-07-03 DIAGNOSIS — Z7189 Other specified counseling: Secondary | ICD-10-CM

## 2014-07-03 MED ORDER — CARVEDILOL 25 MG PO TABS: 25.0000 mg | ORAL_TABLET | Freq: Two times a day (BID) | ORAL | Status: DC

## 2014-07-03 NOTE — Progress Notes (Signed)
Pre visit review using our clinic review tool, if applicable. No additional management support is needed unless otherwise documented below in the visit note.  I have personally reviewed the Medicare Annual Wellness questionnaire and have noted 1. The patient's medical and social history 2. Their use of alcohol, tobacco or illicit drugs 3. Their current medications and supplements 4. The patient's functional ability including ADL's, fall risks, home safety risks and hearing or visual             impairment. 5. Diet and physical activities 6. Evidence for depression or mood disorders  The patients weight, height, BMI have been recorded in the chart and visual acuity is per eye clinic.  I have made referrals, counseling and provided education to the patient based review of the above and I have provided the pt with a written personalized care plan for preventive services.  Provider list updated- see scanned forms.  Routine anticipatory guidance given to patient.  See health maintenance.  Flu encouraged Shingles encouraged PNA encouraged Tetanus 2009 Colon CA screening NA due to age.  Prostate cancer screening and PSA options (with potential risks and benefits of testing vs not testing) were discussed along with recent recs/guidelines.  He declined testing PSA at this point, and this is reasonable given his age.  Advance directive d/w pt.  Son Zephaniah Lubrano designated if patient were incapacitated.  Cognitive function addressed- see scanned forms- and if abnormal then additional documentation follows.   AF. Declined anticoagulation.  No CP except for GERD sx that are not exertional and are clearly related to diet and improve with prn TUMS.  No SOB, no BLE.  Compliant with BB.   Statin intolerant.   Anxiety.  Related to caring for wife.  No ADE on med.  Compliant.  Usually taking 1 a day, rare 2nd dose, with relief.    Mild hyperglycemia.  D/w pt.  No h/o DM2.  On low carb diet.  Exercise is  limited.    PMH and SH reviewed  Meds, vitals, and allergies reviewed.   ROS: See HPI.  Otherwise negative.    GEN: nad, alert and oriented HEENT: mucous membranes moist NECK: supple w/o LA CV: IRR, not tachy PULM: ctab, no inc wob ABD: soft, +bs EXT: no edema SKIN: no acute rash

## 2014-07-03 NOTE — Assessment & Plan Note (Signed)
Statin intolerant 

## 2014-07-03 NOTE — Assessment & Plan Note (Signed)
Related to caring for wife, continue PRN BZD, no ADE on med.  I asked him to let me know if I could be of service with his home situation.  I don't know what else to offer at this point, he has some family and hired help.  He plans to continue as is.

## 2014-07-03 NOTE — Assessment & Plan Note (Signed)
Flu encouraged Shingles encouraged PNA encouraged Tetanus 2009 Colon CA screening NA due to age.  Prostate cancer screening and PSA options (with potential risks and benefits of testing vs not testing) were discussed along with recent recs/guidelines.  He declined testing PSA at this point, and this is reasonable given his age.  Advance directive d/w pt.  Son Chan Sheahan designated if patient were incapacitated.  Cognitive function addressed- see scanned forms- and if abnormal then additional documentation follows.

## 2014-07-03 NOTE — Patient Instructions (Addendum)
If you need help getting an eye exam, then let us know.   Check with your insurance to see if they will cover the shingles shot. Let the pharmacy know when you need refills on your medicines.  Recheck in about 1 year.  Glad to see you.  Let me know if I can help with your home situation.

## 2014-07-03 NOTE — Assessment & Plan Note (Signed)
On back of neck, resolved after treatment from derm.

## 2014-07-03 NOTE — Assessment & Plan Note (Signed)
He has routine f/u with cards pending.  Continue ASA since he declines coumadin.  D/w pt.  Continue BB.  Rate controlled.  BP okay.

## 2014-07-03 NOTE — Assessment & Plan Note (Signed)
He'll continue on low carb diet.  Labs d/w pt.

## 2014-07-16 ENCOUNTER — Encounter: Payer: Self-pay | Admitting: Family Medicine

## 2014-07-17 ENCOUNTER — Other Ambulatory Visit (HOSPITAL_COMMUNITY): Payer: Self-pay

## 2014-07-17 ENCOUNTER — Encounter (HOSPITAL_COMMUNITY): Payer: Self-pay | Admitting: Emergency Medicine

## 2014-07-17 ENCOUNTER — Inpatient Hospital Stay (HOSPITAL_COMMUNITY)
Admission: EM | Admit: 2014-07-17 | Discharge: 2014-07-22 | DRG: 242 | Disposition: A | Discharge status: Home Health | Payer: Medicare Other | Attending: Internal Medicine | Admitting: Internal Medicine

## 2014-07-17 ENCOUNTER — Emergency Department (HOSPITAL_COMMUNITY): Payer: Medicare Other

## 2014-07-17 DIAGNOSIS — R7989 Other specified abnormal findings of blood chemistry: Secondary | ICD-10-CM

## 2014-07-17 DIAGNOSIS — E876 Hypokalemia: Secondary | ICD-10-CM | POA: Diagnosis not present

## 2014-07-17 DIAGNOSIS — Z959 Presence of cardiac and vascular implant and graft, unspecified: Secondary | ICD-10-CM

## 2014-07-17 DIAGNOSIS — R042 Hemoptysis: Secondary | ICD-10-CM | POA: Diagnosis not present

## 2014-07-17 DIAGNOSIS — G8929 Other chronic pain: Secondary | ICD-10-CM | POA: Diagnosis present

## 2014-07-17 DIAGNOSIS — Z7902 Long term (current) use of antithrombotics/antiplatelets: Secondary | ICD-10-CM

## 2014-07-17 DIAGNOSIS — E872 Acidosis, unspecified: Secondary | ICD-10-CM | POA: Diagnosis present

## 2014-07-17 DIAGNOSIS — Z880 Allergy status to penicillin: Secondary | ICD-10-CM

## 2014-07-17 DIAGNOSIS — I4891 Unspecified atrial fibrillation: Secondary | ICD-10-CM

## 2014-07-17 DIAGNOSIS — E785 Hyperlipidemia, unspecified: Secondary | ICD-10-CM | POA: Diagnosis present

## 2014-07-17 DIAGNOSIS — Z79899 Other long term (current) drug therapy: Secondary | ICD-10-CM

## 2014-07-17 DIAGNOSIS — R55 Syncope and collapse: Secondary | ICD-10-CM | POA: Diagnosis not present

## 2014-07-17 DIAGNOSIS — R570 Cardiogenic shock: Secondary | ICD-10-CM | POA: Diagnosis not present

## 2014-07-17 DIAGNOSIS — Z7982 Long term (current) use of aspirin: Secondary | ICD-10-CM | POA: Diagnosis not present

## 2014-07-17 DIAGNOSIS — I493 Ventricular premature depolarization: Secondary | ICD-10-CM | POA: Diagnosis present

## 2014-07-17 DIAGNOSIS — I469 Cardiac arrest, cause unspecified: Secondary | ICD-10-CM | POA: Diagnosis not present

## 2014-07-17 DIAGNOSIS — I482 Chronic atrial fibrillation: Secondary | ICD-10-CM | POA: Diagnosis present

## 2014-07-17 DIAGNOSIS — J811 Chronic pulmonary edema: Secondary | ICD-10-CM | POA: Diagnosis not present

## 2014-07-17 DIAGNOSIS — K219 Gastro-esophageal reflux disease without esophagitis: Secondary | ICD-10-CM | POA: Diagnosis present

## 2014-07-17 DIAGNOSIS — E875 Hyperkalemia: Secondary | ICD-10-CM

## 2014-07-17 DIAGNOSIS — G931 Anoxic brain damage, not elsewhere classified: Secondary | ICD-10-CM | POA: Diagnosis not present

## 2014-07-17 DIAGNOSIS — R778 Other specified abnormalities of plasma proteins: Secondary | ICD-10-CM | POA: Diagnosis present

## 2014-07-17 DIAGNOSIS — I2510 Atherosclerotic heart disease of native coronary artery without angina pectoris: Secondary | ICD-10-CM | POA: Diagnosis not present

## 2014-07-17 DIAGNOSIS — Z8249 Family history of ischemic heart disease and other diseases of the circulatory system: Secondary | ICD-10-CM | POA: Diagnosis not present

## 2014-07-17 DIAGNOSIS — I214 Non-ST elevation (NSTEMI) myocardial infarction: Secondary | ICD-10-CM | POA: Diagnosis not present

## 2014-07-17 DIAGNOSIS — R05 Cough: Secondary | ICD-10-CM | POA: Diagnosis not present

## 2014-07-17 DIAGNOSIS — R0602 Shortness of breath: Secondary | ICD-10-CM | POA: Diagnosis not present

## 2014-07-17 DIAGNOSIS — I442 Atrioventricular block, complete: Principal | ICD-10-CM | POA: Diagnosis present

## 2014-07-17 DIAGNOSIS — I10 Essential (primary) hypertension: Secondary | ICD-10-CM | POA: Diagnosis present

## 2014-07-17 DIAGNOSIS — N179 Acute kidney failure, unspecified: Secondary | ICD-10-CM | POA: Diagnosis present

## 2014-07-17 DIAGNOSIS — R001 Bradycardia, unspecified: Secondary | ICD-10-CM | POA: Diagnosis present

## 2014-07-17 DIAGNOSIS — Z87891 Personal history of nicotine dependence: Secondary | ICD-10-CM

## 2014-07-17 DIAGNOSIS — E8770 Fluid overload, unspecified: Secondary | ICD-10-CM | POA: Diagnosis not present

## 2014-07-17 DIAGNOSIS — Z888 Allergy status to other drugs, medicaments and biological substances status: Secondary | ICD-10-CM | POA: Diagnosis not present

## 2014-07-17 DIAGNOSIS — M549 Dorsalgia, unspecified: Secondary | ICD-10-CM | POA: Diagnosis present

## 2014-07-17 DIAGNOSIS — R0902 Hypoxemia: Secondary | ICD-10-CM | POA: Diagnosis not present

## 2014-07-17 DIAGNOSIS — I517 Cardiomegaly: Secondary | ICD-10-CM | POA: Diagnosis not present

## 2014-07-17 DIAGNOSIS — Z950 Presence of cardiac pacemaker: Secondary | ICD-10-CM | POA: Diagnosis not present

## 2014-07-17 DIAGNOSIS — Z48812 Encounter for surgical aftercare following surgery on the circulatory system: Secondary | ICD-10-CM | POA: Diagnosis not present

## 2014-07-17 DIAGNOSIS — R059 Cough, unspecified: Secondary | ICD-10-CM

## 2014-07-17 LAB — APTT: APTT: 31 s (ref 24–37)

## 2014-07-17 LAB — CBC WITH DIFFERENTIAL/PLATELET
Basophils Absolute: 0.1 10*3/uL (ref 0.0–0.1)
Basophils Relative: 0 % (ref 0–1)
EOS ABS: 0.1 10*3/uL (ref 0.0–0.7)
EOS PCT: 1 % (ref 0–5)
HEMATOCRIT: 44.6 % (ref 39.0–52.0)
Hemoglobin: 15.3 g/dL (ref 13.0–17.0)
LYMPHS ABS: 2.2 10*3/uL (ref 0.7–4.0)
Lymphocytes Relative: 14 % (ref 12–46)
MCH: 30.4 pg (ref 26.0–34.0)
MCHC: 34.3 g/dL (ref 30.0–36.0)
MCV: 88.7 fL (ref 78.0–100.0)
Monocytes Absolute: 1.4 10*3/uL — ABNORMAL HIGH (ref 0.1–1.0)
Monocytes Relative: 9 % (ref 3–12)
Neutro Abs: 11.8 10*3/uL — ABNORMAL HIGH (ref 1.7–7.7)
Neutrophils Relative %: 76 % (ref 43–77)
PLATELETS: 174 10*3/uL (ref 150–400)
RBC: 5.03 MIL/uL (ref 4.22–5.81)
RDW: 13.3 % (ref 11.5–15.5)
WBC: 15.5 10*3/uL — ABNORMAL HIGH (ref 4.0–10.5)

## 2014-07-17 LAB — I-STAT TROPONIN, ED: TROPONIN I, POC: 2.76 ng/mL — AB (ref 0.00–0.08)

## 2014-07-17 LAB — PROTIME-INR
INR: 1.32 (ref 0.00–1.49)
Prothrombin Time: 16.5 seconds — ABNORMAL HIGH (ref 11.6–15.2)

## 2014-07-17 LAB — I-STAT CHEM 8, ED
BUN: 30 mg/dL — ABNORMAL HIGH (ref 6–23)
CHLORIDE: 101 mmol/L (ref 96–112)
Calcium, Ion: 1.08 mmol/L — ABNORMAL LOW (ref 1.13–1.30)
Creatinine, Ser: 1.3 mg/dL (ref 0.50–1.35)
GLUCOSE: 297 mg/dL — AB (ref 70–99)
HEMATOCRIT: 48 % (ref 39.0–52.0)
Hemoglobin: 16.3 g/dL (ref 13.0–17.0)
Potassium: 5.3 mmol/L — ABNORMAL HIGH (ref 3.5–5.1)
Sodium: 132 mmol/L — ABNORMAL LOW (ref 135–145)
TCO2: 17 mmol/L (ref 0–100)

## 2014-07-17 LAB — BASIC METABOLIC PANEL
ANION GAP: 10 (ref 5–15)
BUN: 25 mg/dL — AB (ref 6–23)
CALCIUM: 8.9 mg/dL (ref 8.4–10.5)
CO2: 19 mmol/L (ref 19–32)
CREATININE: 1.67 mg/dL — AB (ref 0.50–1.35)
Chloride: 102 mmol/L (ref 96–112)
GFR calc Af Amer: 42 mL/min — ABNORMAL LOW (ref >90)
GFR calc non Af Amer: 36 mL/min — ABNORMAL LOW (ref >90)
GLUCOSE: 288 mg/dL — AB (ref 70–99)
Potassium: 5.4 mmol/L — ABNORMAL HIGH (ref 3.5–5.1)
Sodium: 131 mmol/L — ABNORMAL LOW (ref 135–145)

## 2014-07-17 LAB — I-STAT CG4 LACTIC ACID, ED: Lactic Acid, Venous: 4.03 mmol/L (ref 0.5–2.0)

## 2014-07-17 LAB — TROPONIN I: Troponin I: 3.21 ng/mL (ref <0.031)

## 2014-07-17 LAB — BRAIN NATRIURETIC PEPTIDE: B NATRIURETIC PEPTIDE 5: 578.5 pg/mL — AB (ref 0.0–100.0)

## 2014-07-17 MED ORDER — GLUCAGON HCL RDNA (DIAGNOSTIC) 1 MG IJ SOLR
1.0000 mg | Freq: Once | INTRAMUSCULAR | Status: AC
  Administered 2014-07-17: 1 mg via INTRAVENOUS
  Filled 2014-07-17: qty 1

## 2014-07-17 MED ORDER — SODIUM CHLORIDE 0.9 % IV SOLN
Freq: Once | INTRAVENOUS | Status: AC
  Administered 2014-07-17: 22:00:00 via INTRAVENOUS

## 2014-07-17 MED ORDER — ACETAMINOPHEN 325 MG PO TABS: 650.0000 mg | ORAL_TABLET | ORAL | Status: DC | PRN

## 2014-07-17 MED ORDER — ASPIRIN 81 MG PO CHEW
324.0000 mg | CHEWABLE_TABLET | Freq: Once | ORAL | Status: AC
  Administered 2014-07-17: 324 mg via ORAL
  Filled 2014-07-17: qty 4

## 2014-07-17 MED ORDER — DOBUTAMINE IN D5W 4-5 MG/ML-% IV SOLN
5.0000 ug/kg/min | INTRAVENOUS | Status: DC
  Administered 2014-07-17: 5 ug/kg/min via INTRAVENOUS
  Filled 2014-07-17: qty 250

## 2014-07-17 MED ORDER — ATROPINE SULFATE 0.1 MG/ML IJ SOLN
1.0000 mg | Freq: Once | INTRAMUSCULAR | Status: AC
  Administered 2014-07-17: 1 mg via INTRAVENOUS

## 2014-07-17 MED ORDER — DOBUTAMINE IN D5W 4-5 MG/ML-% IV SOLN
2.5000 ug/kg/min | INTRAVENOUS | Status: DC
  Administered 2014-07-18: 3 ug/kg/min via INTRAVENOUS

## 2014-07-17 MED ORDER — ASPIRIN EC 81 MG PO TBEC
81.0000 mg | DELAYED_RELEASE_TABLET | Freq: Every day | ORAL | Status: DC
  Administered 2014-07-18: 81 mg via ORAL
  Filled 2014-07-17: qty 1

## 2014-07-17 MED ORDER — NITROGLYCERIN 0.4 MG SL SUBL: 0.4000 mg | SUBLINGUAL_TABLET | SUBLINGUAL | Status: DC | PRN

## 2014-07-17 MED ORDER — HEPARIN BOLUS VIA INFUSION
4000.0000 [IU] | Freq: Once | INTRAVENOUS | Status: AC
  Administered 2014-07-17: 4000 [IU] via INTRAVENOUS
  Filled 2014-07-17: qty 4000

## 2014-07-17 MED ORDER — HEPARIN (PORCINE) IN NACL 100-0.45 UNIT/ML-% IJ SOLN
1000.0000 [IU]/h | INTRAMUSCULAR | Status: DC
  Administered 2014-07-17: 1000 [IU]/h via INTRAVENOUS
  Filled 2014-07-17 (×2): qty 250

## 2014-07-17 MED ORDER — ONDANSETRON HCL 4 MG/2ML IJ SOLN: 4.0000 mg | Freq: Four times a day (QID) | INTRAMUSCULAR | Status: DC | PRN

## 2014-07-17 NOTE — ED Notes (Signed)
Dr.  Philbert Riser, cardiologist at bedside.

## 2014-07-17 NOTE — Progress Notes (Signed)
ANTICOAGULATION CONSULT NOTE - Initial Consult  Pharmacy Consult for Heparin  Indication: chest pain/ACS  Allergies  Allergen Reactions  . Atorvastatin     REACTION: MUSCLE PAIN  . Diazepam     REACTION: UNSPECIFIED  . Ezetimibe     REACTION: MUSCLE PAIN  . Penicillins     REACTION: UNSPECIFIED    Patient Measurements: Height: 6' 0.5" (184.2 cm) Weight: 200 lb (90.719 kg) IBW/kg (Calculated) : 78.75  Vital Signs: Temp: 97.6 F (36.4 C) (03/17 2208) Temp Source: Tympanic (03/17 2208) BP: 111/59 mmHg (03/17 2330) Pulse Rate: 63 (03/17 2330)  Labs:  Recent Labs  07/17/14 2212 07/17/14 2217  HGB 15.3 16.3  HCT 44.6 48.0  PLT 174  --   APTT 31  --   LABPROT 16.5*  --   INR 1.32  --   CREATININE 1.67* 1.30  TROPONINI 3.21*  --     Estimated Creatinine Clearance: 48 mL/min (by C-G formula based on Cr of 1.3).   Medical History: Past Medical History  Diagnosis Date  . GERD (gastroesophageal reflux disease) 10/2002  . Hyperlipidemia 12/1994  . Hypertension   . Chronic atrial fibrillation     not on coumadin therapy  . Back pain, chronic   . Lung collapse 03/1991    Fall    Assessment: 81 y/o M to start heparin for elevated troponin. CBC good, renal function ok, other labs as above.   Goal of Therapy:  Heparin level 0.3-0.7 units/ml Monitor platelets by anticoagulation protocol: Yes   Plan:  -Heparin 4000 units BOLUS -Start heparin drip at 1000 units/hr -0900 HL -Daily CBC/HL  -Monitor for bleeding  Narda Bonds 07/17/2014,11:37 PM

## 2014-07-17 NOTE — ED Notes (Signed)
Dr. Philbert Riser states to leave dobutamine drip at 5 mcg/kg/min as of now as long as his HR maintains around 50 bpm and unless pts BP falls below 90 systolic.

## 2014-07-17 NOTE — ED Provider Notes (Signed)
The patient is an 81 year old male with chronic atrial fibrillation who presents with a complaint of generalized weakness and some chest pain. He had this yesterday, it improved and last night he did well, again today it came on rather acutely and has been persistent and severe. On exam the patient is severely bradycardic with a pulse of approximately 30, he has underlying atrial fibrillation, no obvious P waves are discernible, QRS is widened, he is on a beta blocker for his heart, he is not anticoagulated other than aspirin, he is ill appearing somnolent as bilateral pedal edema right greater than left, clear lung sounds, bradycardic heart sounds but strong pulses at the carotid and brachial arteries.  The patient will need urgent and emergent cardiology evaluation, chest x-ray, labs, cardiac monitoring, temporary pacing as needed.  ED ECG REPORT  I personally interpreted this EKG   Date: 07/20/2014   Rate: 30  Rhythm: atrial fibrillation with slowed response  QRS Axis: right  Intervals: afib, wide QRS  ST/T Wave abnormalities: nonspecific T wave changes  Conduction Disutrbances:right bundle branch block  Narrative Interpretation:   Old EKG Reviewed: changes noted - now very slow or CHB  ED ECG REPORT  I personally interpreted this EKG   Date: 07/20/2014   Rate: 49  Rhythm: atrial fibrillation  QRS Axis: right  Intervals: QRS widened  ST/T Wave abnormalities: nonspecific T wave changes  Conduction Disutrbances:right bundle branch block  Narrative Interpretation:   Old EKG Reviewed: changes noted - slight increase in rate  The patient was given dobutamine drip, atropine and glucagon, still remains in atrial fibrillation with a very slow response at approximately 30 bpm, still hypotensive, starts to have increased episodes of a heart rate of close her to 50 and then reverts to 30 bpm. This is becoming less and less frequent and his heart rate is becoming more and more close to 40-50.  Cardiology is involved, they will admit him to the critical care unit.  CRITICAL CARE Performed by: Johnna Acosta Total critical care time: 35 Critical care time was exclusive of separately billable procedures and treating other patients. Critical care was necessary to treat or prevent imminent or life-threatening deterioration. Critical care was time spent personally by me on the following activities: development of treatment plan with patient and/or surrogate as well as nursing, discussions with consultants, evaluation of patient's response to treatment, examination of patient, obtaining history from patient or surrogate, ordering and performing treatments and interventions, ordering and review of laboratory studies, ordering and review of radiographic studies, pulse oximetry and re-evaluation of patient's condition.  I saw and evaluated the patient, reviewed the resident's note and I agree with the findings and plan.  Final diagnoses:  SOB (shortness of breath)  Atrial fibrillation with slow ventricular response  NSTEMI (non-ST elevated myocardial infarction)  Hyperkalemia       Noemi Chapel, MD 07/20/14 [id number]

## 2014-07-17 NOTE — ED Notes (Signed)
Troponin elevated at 3.21. Dr. Philbert Riser informed.

## 2014-07-17 NOTE — ED Notes (Signed)
Pt and son reports the pt suddenly became dizzy and lightheaded earlier this evening after 6pm and the patient states he thinks he may have passed out, fell down twice by 7pm.

## 2014-07-17 NOTE — ED Notes (Signed)
Weak, reports sycopal episode

## 2014-07-17 NOTE — ED Notes (Signed)
Pt placed on zoll.

## 2014-07-17 NOTE — ED Notes (Signed)
Pt gray upon arrival to trauma room, HR in the 30s. Alert and oriented, MD at bedside.

## 2014-07-17 NOTE — ED Provider Notes (Signed)
CSN: [id number]     Arrival date & time 07/17/14  2157 History   First MD Initiated Contact with Patient 07/17/14 2212     Chief Complaint  Patient presents with  . Weakness     (Consider location/radiation/quality/duration/timing/severity/associated sxs/prior Treatment) Patient is a 81 y.o. male presenting with weakness. The history is provided by the patient.  Weakness This is a new problem. The current episode started in the past 7 days. The problem occurs intermittently. The problem has been unchanged. Associated symptoms include chest pain, fatigue, nausea and weakness. Pertinent negatives include no coughing, diaphoresis or numbness. Nothing aggravates the symptoms. He has tried nothing for the symptoms. The treatment provided no relief.    Past Medical History  Diagnosis Date  . GERD (gastroesophageal reflux disease) 10/2002  . Hyperlipidemia 12/1994  . Hypertension   . Chronic atrial fibrillation     not on coumadin therapy  . Back pain, chronic   . Lung collapse 03/1991    Fall   Past Surgical History  Procedure Laterality Date  . Back surgery  11/91    with hardware fixation  . Cardiac catheterization  06/2004    30 % stenosis, EF normal  . Eye surgery      Retinal bubble surgery, cataract    Family History  Problem Relation Age of Onset  . Pneumonia Mother   . Hip fracture Mother   . Cancer Father     splenic  . Heart disease Sister     Pacer placed  . Cancer Brother     throat and lung  . Heart disease Son   . Stroke Neg Hx   . Colon cancer Neg Hx   . Cancer Brother     prostate, age 57  . Prostate cancer Brother    History  Substance Use Topics  . Smoking status: Former Smoker -- 1.00 packs/day for 15 years    Quit date: 05/02/1953  . Smokeless tobacco: Current User    Types: Chew     Comment: Quit smoking over 50 years ago  . Alcohol Use: No    Review of Systems  Constitutional: Positive for fatigue. Negative for diaphoresis, activity change  and appetite change.  Respiratory: Negative for cough, chest tightness and shortness of breath.   Cardiovascular: Positive for chest pain.  Gastrointestinal: Positive for nausea.  Neurological: Positive for weakness. Negative for numbness.  All other systems reviewed and are negative.     Allergies  Atorvastatin; Diazepam; Ezetimibe; and Penicillins  Home Medications   Prior to Admission medications   Medication Sig Start Date End Date Taking? Authorizing Provider  ALPRAZolam Duanne Moron) 0.5 MG tablet TAKE ONE-HALF TABLET BY MOUTH TWICE DAILY AS NEEDED FOR ANXIETY 06/25/14  Yes Tonia Ghent, MD  aspirin 81 MG EC tablet Take 81 mg by mouth daily.     Yes Historical Provider, MD  calcium carbonate (TUMS - DOSED IN MG ELEMENTAL CALCIUM) 500 MG chewable tablet Chew 1 tablet by mouth daily as needed for indigestion.    Yes Historical Provider, MD  carvedilol (COREG) 25 MG tablet Take 1 tablet (25 mg total) by mouth 2 (two) times daily with a meal. 07/03/14  Yes Tonia Ghent, MD  ranitidine (ZANTAC) 150 MG tablet Take 150 mg by mouth daily. 10/26/12  Yes Tonia Ghent, MD   BP 109/89 mmHg  Pulse 62  Temp(Src) 97.6 F (36.4 C) (Tympanic)  Resp 25  Ht 6' 0.5" (1.842 m)  Wt 200 lb (  90.719 kg)  BMI 26.74 kg/m2  SpO2 100% Physical Exam  Constitutional: He is oriented to person, place, and time. He appears well-developed and well-nourished. He appears distressed.  Somnolent but arrowsed easily. Alert and oriented  HENT:  Head: Normocephalic and atraumatic.  Mouth/Throat: Oropharynx is clear and moist. No oropharyngeal exudate.  Eyes: Conjunctivae and EOM are normal. Pupils are equal, round, and reactive to light.  Neck: Normal range of motion. Neck supple.  Cardiovascular: Normal heart sounds.  An irregular rhythm present. Bradycardia present.  Exam reveals decreased pulses. Exam reveals no gallop and no friction rub.   No murmur heard. Pulmonary/Chest: Effort normal and breath sounds  normal. No respiratory distress. He has no wheezes. He has no rales.  Abdominal: Soft. He exhibits no distension and no mass. There is no tenderness. There is no rebound and no guarding.  Musculoskeletal: Normal range of motion. He exhibits edema (1+ to knees bilaterally). He exhibits no tenderness.  Lymphadenopathy:    He has no cervical adenopathy.  Neurological: He is alert and oriented to person, place, and time. No cranial nerve deficit.  Skin: Skin is warm. No rash noted. He is diaphoretic.  Nursing note and vitals reviewed.   ED Course  Procedures (including critical care time) Labs Review Labs Reviewed  BASIC METABOLIC PANEL - Abnormal; Notable for the following:    Sodium 131 (*)    Potassium 5.4 (*)    Glucose, Bld 288 (*)    BUN 25 (*)    Creatinine, Ser 1.67 (*)    GFR calc non Af Amer 36 (*)    GFR calc Af Amer 42 (*)    All other components within normal limits  CBC WITH DIFFERENTIAL/PLATELET - Abnormal; Notable for the following:    WBC 15.5 (*)    Neutro Abs 11.8 (*)    Monocytes Absolute 1.4 (*)    All other components within normal limits  PROTIME-INR - Abnormal; Notable for the following:    Prothrombin Time 16.5 (*)    All other components within normal limits  TROPONIN I - Abnormal; Notable for the following:    Troponin I 3.21 (*)    All other components within normal limits  BRAIN NATRIURETIC PEPTIDE - Abnormal; Notable for the following:    B Natriuretic Peptide 578.5 (*)    All other components within normal limits  I-STAT TROPOININ, ED - Abnormal; Notable for the following:    Troponin i, poc 2.76 (*)    All other components within normal limits  I-STAT CHEM 8, ED - Abnormal; Notable for the following:    Sodium 132 (*)    Potassium 5.3 (*)    BUN 30 (*)    Glucose, Bld 297 (*)    Calcium, Ion 1.08 (*)    All other components within normal limits  I-STAT CG4 LACTIC ACID, ED - Abnormal; Notable for the following:    Lactic Acid, Venous 4.03 (*)     All other components within normal limits  MRSA PCR SCREENING  APTT  TROPONIN I  TROPONIN I  TROPONIN I  TSH  T4, FREE  BASIC METABOLIC PANEL  CBC  HEPARIN LEVEL (UNFRACTIONATED)    Imaging Review Dg Chest Port 1 View  07/17/2014   CLINICAL DATA:  Acute onset of shortness of breath and decreased heart rate. Initial encounter.  EXAM: PORTABLE CHEST - 1 VIEW  COMPARISON:  Chest radiograph performed 01/02/2009  FINDINGS: The lungs are well-aerated and clear. There is no evidence  of focal opacification, pleural effusion or pneumothorax.  The cardiomediastinal silhouette is borderline normal in size. No acute osseous abnormalities are seen. External pacing pads are noted.  IMPRESSION: No acute cardiopulmonary process seen.   Electronically Signed   By: Garald Balding M.D.   On: 07/17/2014 23:00     EKG Interpretation None      MDM   Final diagnoses:  SOB (shortness of breath)  Atrial fibrillation with slow ventricular response  NSTEMI (non-ST elevated myocardial infarction)  Hyperkalemia    81 year old male with past medical history of heart block as well as atrial fibrillation on Coreg and coronary artery disease presents with symptomatic bradycardia. Elevation and had multiple episodes over the last 2 days really acutely becomes nauseous, fatigued, diaphoretic. The episode today has started in the last hour and lasted the area is not  On arrival heart rate is in the 30s. He is alert and oriented initial blood pressure is soft, 71G and 62I systolic blood is not otherwise unstable. Glucagon as well as atropine tried without significant improvement. He does intermittently have episodes of striking his heart rate into the 40s with intrinsic rhythm. EKG obtained shows no ST changes. We discussed the case with cardiology who recommended starting dobutamine. Dobutamine significantly improved heart rate in the 50s and 60s with blood pressure rising into the 948 systolic.  Chest x-ray  obtained and is unremarkable. His troponin came back at over 3 and aspirin was given. BNP also elevated and likely suggestive of cardiac etiology to the bradycardia. The beginning drip maintained and the patient was admitted to the intensive care for further monitoring and treatment.     Larence Penning, MD 07/18/14 Benancio Deeds  Noemi Chapel, MD 07/20/14 [phone]

## 2014-07-17 NOTE — ED Notes (Signed)
Dr. Philbert Riser discussing possibility of placing a pacemaker in patient. Patient A&Ox4, son Abe People at bedside.

## 2014-07-18 ENCOUNTER — Encounter (HOSPITAL_COMMUNITY): Payer: Self-pay | Admitting: Cardiology

## 2014-07-18 ENCOUNTER — Encounter (HOSPITAL_COMMUNITY)
Admission: EM | Disposition: A | Discharge status: Home Health | Payer: Self-pay | Source: Home / Self Care | Attending: Internal Medicine

## 2014-07-18 DIAGNOSIS — I4891 Unspecified atrial fibrillation: Secondary | ICD-10-CM

## 2014-07-18 DIAGNOSIS — R778 Other specified abnormalities of plasma proteins: Secondary | ICD-10-CM | POA: Diagnosis present

## 2014-07-18 DIAGNOSIS — I2510 Atherosclerotic heart disease of native coronary artery without angina pectoris: Secondary | ICD-10-CM

## 2014-07-18 DIAGNOSIS — I442 Atrioventricular block, complete: Secondary | ICD-10-CM

## 2014-07-18 DIAGNOSIS — E875 Hyperkalemia: Secondary | ICD-10-CM

## 2014-07-18 DIAGNOSIS — R570 Cardiogenic shock: Secondary | ICD-10-CM

## 2014-07-18 DIAGNOSIS — N179 Acute kidney failure, unspecified: Secondary | ICD-10-CM

## 2014-07-18 DIAGNOSIS — R7989 Other specified abnormal findings of blood chemistry: Secondary | ICD-10-CM

## 2014-07-18 DIAGNOSIS — R001 Bradycardia, unspecified: Secondary | ICD-10-CM

## 2014-07-18 DIAGNOSIS — I482 Chronic atrial fibrillation: Secondary | ICD-10-CM

## 2014-07-18 DIAGNOSIS — I214 Non-ST elevation (NSTEMI) myocardial infarction: Secondary | ICD-10-CM | POA: Insufficient documentation

## 2014-07-18 DIAGNOSIS — E872 Acidosis, unspecified: Secondary | ICD-10-CM | POA: Diagnosis present

## 2014-07-18 DIAGNOSIS — R0602 Shortness of breath: Secondary | ICD-10-CM

## 2014-07-18 HISTORY — DX: Atrioventricular block, complete: I44.2

## 2014-07-18 HISTORY — PX: LEFT HEART CATHETERIZATION WITH CORONARY ANGIOGRAM: SHX5451

## 2014-07-18 HISTORY — PX: PERMANENT PACEMAKER INSERTION: SHX5480

## 2014-07-18 HISTORY — PX: TEMPORARY PACEMAKER INSERTION: SHX5471

## 2014-07-18 LAB — TROPONIN I
TROPONIN I: 2.24 ng/mL — AB (ref <0.031)
Troponin I: 3.01 ng/mL (ref <0.031)
Troponin I: 3.32 ng/mL (ref <0.031)

## 2014-07-18 LAB — BASIC METABOLIC PANEL
ANION GAP: 6 (ref 5–15)
BUN: 25 mg/dL — ABNORMAL HIGH (ref 6–23)
CALCIUM: 8.6 mg/dL (ref 8.4–10.5)
CO2: 25 mmol/L (ref 19–32)
Chloride: 105 mmol/L (ref 96–112)
Creatinine, Ser: 1.37 mg/dL — ABNORMAL HIGH (ref 0.50–1.35)
GFR calc Af Amer: 53 mL/min — ABNORMAL LOW (ref >90)
GFR calc non Af Amer: 46 mL/min — ABNORMAL LOW (ref >90)
GLUCOSE: 130 mg/dL — AB (ref 70–99)
POTASSIUM: 4.4 mmol/L (ref 3.5–5.1)
SODIUM: 136 mmol/L (ref 135–145)

## 2014-07-18 LAB — CBC
HCT: 41.9 % (ref 39.0–52.0)
Hemoglobin: 14.3 g/dL (ref 13.0–17.0)
MCH: 30 pg (ref 26.0–34.0)
MCHC: 34.1 g/dL (ref 30.0–36.0)
MCV: 87.8 fL (ref 78.0–100.0)
PLATELETS: 161 10*3/uL (ref 150–400)
RBC: 4.77 MIL/uL (ref 4.22–5.81)
RDW: 13.1 % (ref 11.5–15.5)
WBC: 13.9 10*3/uL — ABNORMAL HIGH (ref 4.0–10.5)

## 2014-07-18 LAB — TSH: TSH: 3.775 u[IU]/mL (ref 0.350–4.500)

## 2014-07-18 LAB — T4, FREE: FREE T4: 1.22 ng/dL (ref 0.80–1.80)

## 2014-07-18 LAB — MRSA PCR SCREENING: MRSA BY PCR: NEGATIVE

## 2014-07-18 SURGERY — PERMANENT PACEMAKER INSERTION
Anesthesia: LOCAL

## 2014-07-18 SURGERY — TEMPORARY PACEMAKER INSERTION
Anesthesia: LOCAL

## 2014-07-18 SURGERY — LEFT HEART CATHETERIZATION WITH CORONARY ANGIOGRAM
Anesthesia: LOCAL

## 2014-07-18 MED ORDER — ONDANSETRON HCL 4 MG/2ML IJ SOLN: 4.0000 mg | Freq: Four times a day (QID) | INTRAMUSCULAR | Status: DC | PRN

## 2014-07-18 MED ORDER — SODIUM CHLORIDE 0.9 % IR SOLN
80.0000 mg | Status: DC
  Filled 2014-07-18: qty 2

## 2014-07-18 MED ORDER — ONDANSETRON HCL 4 MG/2ML IJ SOLN
INTRAMUSCULAR | Status: AC
  Filled 2014-07-18: qty 2

## 2014-07-18 MED ORDER — SODIUM CHLORIDE 0.9 % IV SOLN
INTRAVENOUS | Status: DC
  Administered 2014-07-18: 02:00:00 via INTRAVENOUS

## 2014-07-18 MED ORDER — VERAPAMIL HCL 2.5 MG/ML IV SOLN
INTRAVENOUS | Status: AC
  Filled 2014-07-18: qty 2

## 2014-07-18 MED ORDER — MIDAZOLAM HCL 5 MG/5ML IJ SOLN
INTRAMUSCULAR | Status: AC
  Filled 2014-07-18: qty 5

## 2014-07-18 MED ORDER — HEPARIN (PORCINE) IN NACL 2-0.9 UNIT/ML-% IJ SOLN
INTRAMUSCULAR | Status: AC
  Filled 2014-07-18: qty 1000

## 2014-07-18 MED ORDER — HEPARIN SODIUM (PORCINE) 1000 UNIT/ML IJ SOLN
INTRAMUSCULAR | Status: AC
  Filled 2014-07-18: qty 1

## 2014-07-18 MED ORDER — LIDOCAINE HCL (PF) 1 % IJ SOLN
INTRAMUSCULAR | Status: AC
  Filled 2014-07-18: qty 30

## 2014-07-18 MED ORDER — HEPARIN (PORCINE) IN NACL 2-0.9 UNIT/ML-% IJ SOLN
INTRAMUSCULAR | Status: AC
  Filled 2014-07-18: qty 500

## 2014-07-18 MED ORDER — SODIUM CHLORIDE 0.9 % IV SOLN
1.0000 mL/kg/h | INTRAVENOUS | Status: DC
  Administered 2014-07-18: 1 mL/kg/h via INTRAVENOUS

## 2014-07-18 MED ORDER — VANCOMYCIN HCL IN DEXTROSE 1-5 GM/200ML-% IV SOLN
1000.0000 mg | INTRAVENOUS | Status: DC
  Filled 2014-07-18: qty 200

## 2014-07-18 MED ORDER — SODIUM CHLORIDE 0.9 % IJ SOLN: 3.0000 mL | INTRAMUSCULAR | Status: DC | PRN

## 2014-07-18 MED ORDER — LIDOCAINE HCL (PF) 1 % IJ SOLN
INTRAMUSCULAR | Status: AC
  Filled 2014-07-18: qty 60

## 2014-07-18 MED ORDER — SODIUM CHLORIDE 0.9 % IJ SOLN
3.0000 mL | Freq: Two times a day (BID) | INTRAMUSCULAR | Status: DC
  Administered 2014-07-18: 3 mL via INTRAVENOUS

## 2014-07-18 MED ORDER — HEPARIN (PORCINE) IN NACL 2-0.9 UNIT/ML-% IJ SOLN
INTRAMUSCULAR | Status: AC
Start: 2014-07-18 — End: 2014-07-18
  Filled 2014-07-18: qty 500

## 2014-07-18 MED ORDER — DOPAMINE-DEXTROSE 3.2-5 MG/ML-% IV SOLN
INTRAVENOUS | Status: AC
  Administered 2014-07-18: 5 mg
  Filled 2014-07-18: qty 250

## 2014-07-18 MED ORDER — VANCOMYCIN HCL IN DEXTROSE 1-5 GM/200ML-% IV SOLN
1000.0000 mg | Freq: Two times a day (BID) | INTRAVENOUS | Status: AC
  Administered 2014-07-19: 1000 mg via INTRAVENOUS
  Filled 2014-07-18: qty 200

## 2014-07-18 MED ORDER — CHLORHEXIDINE GLUCONATE 4 % EX LIQD: 60.0000 mL | Freq: Once | CUTANEOUS | Status: DC

## 2014-07-18 MED ORDER — FENTANYL CITRATE 0.05 MG/ML IJ SOLN
INTRAMUSCULAR | Status: AC
  Filled 2014-07-18: qty 2

## 2014-07-18 MED ORDER — SODIUM CHLORIDE 0.9 % IV SOLN
INTRAVENOUS | Status: DC
Start: 2014-07-18 — End: 2014-07-19

## 2014-07-18 MED ORDER — ASPIRIN 81 MG PO CHEW
81.0000 mg | CHEWABLE_TABLET | Freq: Every day | ORAL | Status: DC
  Administered 2014-07-19 – 2014-07-22 (×4): 81 mg via ORAL
  Filled 2014-07-18 (×4): qty 1

## 2014-07-18 MED ORDER — SODIUM CHLORIDE 0.9 % IV SOLN: 250.0000 mL | INTRAVENOUS | Status: DC | PRN

## 2014-07-18 MED ORDER — CETYLPYRIDINIUM CHLORIDE 0.05 % MT LIQD
7.0000 mL | Freq: Two times a day (BID) | OROMUCOSAL | Status: DC
  Administered 2014-07-18 – 2014-07-22 (×9): 7 mL via OROMUCOSAL

## 2014-07-18 MED ORDER — SODIUM CHLORIDE 0.9 % IJ SOLN: 3.0000 mL | Freq: Two times a day (BID) | INTRAMUSCULAR | Status: DC

## 2014-07-18 MED ORDER — HYDROCODONE-ACETAMINOPHEN 5-325 MG PO TABS
1.0000 | ORAL_TABLET | ORAL | Status: DC | PRN
  Administered 2014-07-22: 2 via ORAL
  Filled 2014-07-18: qty 2

## 2014-07-18 MED ORDER — ACETAMINOPHEN 325 MG PO TABS: 650.0000 mg | ORAL_TABLET | ORAL | Status: DC | PRN

## 2014-07-18 MED ORDER — ACETAMINOPHEN 325 MG PO TABS: 325.0000 mg | ORAL_TABLET | ORAL | Status: DC | PRN

## 2014-07-18 MED ORDER — SODIUM CHLORIDE 0.9 % IV SOLN: 1.0000 mL/kg/h | INTRAVENOUS | Status: DC

## 2014-07-18 NOTE — Progress Notes (Signed)
  2D Echocardiogram has been performed.  Dakota [name] 07/18/2014, 11:48 AM

## 2014-07-18 NOTE — Consult Note (Signed)
ELECTROPHYSIOLOGY CONSULT NOTE    Patient ID: Dakota [name] MRN: [id number], DOB/AGE: [birth year] 81 y.o.  Admit date: 07/17/2014 Date of Consult: 07/18/2014  Primary Physician: [name], MD Primary Cardiologist: Johnsie Cancel  Reason for Consultation: symptomatic complete heart block  HPI:  Dakota [name] is a 81 y.o. male with a past medical history significant for hyperlipidemia, hypertension, permanent atrial fibrillation (previously declined anticoagulation) who presented with fatigue and syncope on 07-17-14.  He was found to be in atrial fibrillation with complete heart block and ventricular escape in the 30's and underwent placement of temporary pacemaker last night.  Lab work is notable for peak troponin of 3.32, lactic acid of 4.03, creat 1.67 on admission (trending down).  He has been on Coreg 25mg  twice daily for rate control of afib and reports that his last dose was 07-17-14 pm.  He had had intermittent indigestion symptoms.  He has also had chest tightness and shortness of breath for the last week.  Last night when he presented, he did not have chest pain, but did have dizziness.   Last echo 2006 demonstrated normal LV.  Last cath 2006 demonstrated no obstructive CAD.    EP has been asked to evaluate for treatment options.   Past Medical History  Diagnosis Date  . GERD (gastroesophageal reflux disease) 10/2002  . Hyperlipidemia 12/1994  . Hypertension   . Chronic atrial fibrillation     not on coumadin therapy  . Back pain, chronic   . Lung collapse 03/1991    Fall     Surgical History:  Past Surgical History  Procedure Laterality Date  . Back surgery  11/91    with hardware fixation  . Cardiac catheterization  06/2004    30 % stenosis, EF normal  . Eye surgery      Retinal bubble surgery, cataract      Prescriptions prior to admission  Medication Sig Dispense Refill Last Dose  . ALPRAZolam (XANAX) 0.5 MG tablet TAKE ONE-HALF TABLET BY MOUTH TWICE DAILY AS  NEEDED FOR ANXIETY 60 tablet 2 07/17/2014 at Unknown time  . aspirin 81 MG EC tablet Take 81 mg by mouth daily.     07/17/2014 at Unknown time  . calcium carbonate (TUMS - DOSED IN MG ELEMENTAL CALCIUM) 500 MG chewable tablet Chew 1 tablet by mouth daily as needed for indigestion.    unknown  . carvedilol (COREG) 25 MG tablet Take 1 tablet (25 mg total) by mouth 2 (two) times daily with a meal. 180 tablet 3 07/17/2014 at 1900  . ranitidine (ZANTAC) 150 MG tablet Take 150 mg by mouth daily.   07/16/2014 at Unknown time    Inpatient Medications:  . antiseptic oral rinse  7 mL Mouth Rinse BID  . aspirin EC  81 mg Oral Daily    Allergies:  Allergies  Allergen Reactions  . Atorvastatin     REACTION: MUSCLE PAIN  . Diazepam     REACTION: UNSPECIFIED  . Ezetimibe     REACTION: MUSCLE PAIN  . Penicillins     REACTION: UNSPECIFIED    History   Social History  . Marital Status: Married    Spouse Name: N/A  . Number of Children: 4  . Years of Education: N/A   Occupational History  . retired     Architect   Social History Main Topics  . Smoking status: Former Smoker -- 1.00 packs/day for 15 years    Quit date: 05/02/1953  . Smokeless tobacco: Current  User    Types: Chew     Comment: Quit smoking over 50 years ago  . Alcohol Use: No  . Drug Use: No  . Sexual Activity: No   Other Topics Concern  . Not on file   Social History Narrative   Retired from [employer], building/construction   Married 1953   4 kids   Caring for his wife at home- source of anxiety for patient     Family History  Problem Relation Age of Onset  . Pneumonia Mother   . Hip fracture Mother   . Cancer Father     splenic  . Heart disease Sister     Pacer placed  . Cancer Brother     throat and lung  . Heart disease Son   . Stroke Neg Hx   . Colon cancer Neg Hx   . Cancer Brother     prostate, age 45  . Prostate cancer Brother      Review of Systems: General: No chills, fever, night  sweats or weight changes  Cardiovascular:  + intermittent chest pain, + intermittent dyspnea on exertion, edema, orthopnea, palpitations, paroxysmal nocturnal dyspnea Dermatological: No rash, lesions or masses Respiratory: No cough, dyspnea Urologic: No hematuria, dysuria Abdominal: No nausea, vomiting, diarrhea, bright red blood per rectum, melena, or hematemesis Neurologic: No visual changes, weakness, changes in mental status All other systems reviewed and are otherwise negative except as noted above.  Physical Exam: Filed Vitals:   07/18/14 0500 07/18/14 0600 07/18/14 0700 07/18/14 0744  BP: 115/64 101/60 132/99 127/86  Pulse: 79 80 79 80  Temp:    97.9 F (36.6 C)  TempSrc:    Oral  Resp: 19 15 17 22   Height:      Weight:      SpO2: 94% 96% 99% 96%    GEN- The patient is well appearing, alert and oriented x 3 today.   HEENT: normocephalic, atraumatic; sclera clear, conjunctiva pink; hearing intact; oropharynx clear; neck supple, no JVP Lymph- no cervical lymphadenopathy Lungs- Clear to ausculation bilaterally, normal work of breathing.  No wheezes, rales, rhonchi Heart- Regular rate and rhythm, no murmurs, rubs or gallops, PMI not laterally displaced GI- soft, non-tender, non-distended, bowel sounds present, no hepatosplenomegaly Extremities- no clubbing, cyanosis, or edema; DP/PT/radial pulses 2+ bilaterally MS- no significant deformity or atrophy Skin- warm and dry, no rash or lesion Psych- euthymic mood, full affect Neuro- strength and sensation are intact  Labs:   Lab Results  Component Value Date   WBC 13.9* 07/18/2014   HGB 14.3 07/18/2014   HCT 41.9 07/18/2014   MCV 87.8 07/18/2014   PLT 161 07/18/2014    Recent Labs Lab 07/18/14 0315  NA 136  K 4.4  CL 105  CO2 25  BUN 25*  CREATININE 1.37*  CALCIUM 8.6  GLUCOSE 130*      Radiology/Studies: Dg Chest Port 1 View 07/17/2014   CLINICAL DATA:  Acute onset of shortness of breath and decreased heart  rate. Initial encounter.  EXAM: PORTABLE CHEST - 1 VIEW  COMPARISON:  Chest radiograph performed 01/02/2009  FINDINGS: The lungs are well-aerated and clear. There is no evidence of focal opacification, pleural effusion or pneumothorax.  The cardiomediastinal silhouette is borderline normal in size. No acute osseous abnormalities are seen. External pacing pads are noted.  IMPRESSION: No acute cardiopulmonary process seen.   Electronically Signed   By: Garald Balding M.D.   On: 07/17/2014 23:00    JOA:CZYSAY fibrillation  with ventricular escape at 30  TELEMETRY: V pacing at 80  Assessment/Plan:  1.  Complete heart block Patient presented with symptomatic complete heart block  He has also had intermittent chest pain and dyspnea Continue to hold Coreg Check echo this morning With troponin of >3 and intermittent chest pain, would recommend heart catheterization to evaluate for progression of CAD. Creatinine trending down, will hydrate today in anticipation of cath this afternoon.  CRT vs single chamber depending on EF.   2.  Permanent atrial fibrillation This patients CHA2DS2-VASc Score and unadjusted Ischemic Stroke Rate (% per year) is equal to 3.2 % stroke rate/year from a score of 3 Above score calculated as 1 point each if present [CHF, HTN, DM, Vascular=MI/PAD/Aortic Plaque, Age if 65-74, or Male] Above score calculated as 2 points each if present [Age > 75, or Stroke/TIA/TE]  The patient has previously declined anti-coagulation  3.  HTN Stable No change required today Continue to hold Coreg in setting of AV block   Signed, Dakota Marshall, NP 07/18/2014 10:35 AM   I have seen, examined the patient, and reviewed the above assessment and plan.  Changes to above are made where necessary.  Call emergently to bedside for temp pacing wire not capturing.  This could not be repositioned by me at bedside and therefore he has been taken urgently to the cath lab by Dr Irish Lack for temp wire  repositioning and also cath (given elevated Tn and recent chest discomfort). Echo reveals normal EF.  Cath is reviewed with Dr Irish Lack.  He has distal LCx stenosis which appears recent but no lesions to explain his AV block.  I have spoken with Dr Johnsie Cancel.  We agree that the patient requires beta blockers long term for CAD/ angina management as well as for rate control of his afib.  I would therefore recommend pacemaker implantation at this time.   Risks, benefits, alternatives to pacemaker implantation were discussed in detail with the patient and his son today. They understand that the risks include but are not limited to bleeding, infection, pneumothorax, perforation, tamponade, vascular damage, renal failure, MI, stroke, death,  and lead dislodgement and wishes to proceed.  As his temporary pacing wire is unreliable and he is presently transcutaneously pacing, we will proceed urgently with PPM implant at this time.   Co Sign: Dakota Grayer, MD 07/18/2014 3:28 PM

## 2014-07-18 NOTE — Progress Notes (Signed)
Patient arrived back from cath lab post cath procedure and placement of a permanent pacemaker. TR band to the right radial. Site level 0. TR band able to be removed without any complications. Tegaderm at site.   Permanent pacemaker to the left chest. Patient V-paced on monitor at 90. Sling to left arm. Patient educated on importance of keeping his arm still and at heart level. Patient forgetful and slightly confused. Will continue to re-orient and remind to keep arm still. Order placed for bedside sitter.  Zoll pads to remain on patient at this time per MD orders.   Pt has bedrest for 6 hours post procedure.   Frequent vital set up. Vitals stable at this time.    Roxan Hockey, RN

## 2014-07-18 NOTE — H&P (View-Only) (Signed)
Patient Name: Dakota [name] Date of Encounter: 07/18/2014  Active Problems:   Atrial fibrillation   CHB (complete heart block)   ARF (acute renal failure)   Lactic acidosis   Cardiogenic shock   Elevated troponin level   Length of Stay: 1  SUBJECTIVE  The patient feels tired today, denies chest pain or SOB.   CURRENT MEDS . antiseptic oral rinse  7 mL Mouth Rinse BID  . aspirin EC  81 mg Oral Daily   . sodium chloride 10 mL/hr at 07/18/14 0200  . DOBUTamine Stopped (07/18/14 0253)  . heparin Stopped (07/18/14 0130)   OBJECTIVE  Filed Vitals:   07/18/14 0500 07/18/14 0600 07/18/14 0700 07/18/14 0744  BP: 115/64 101/60 132/99 127/86  Pulse: 79 80 79 80  Temp:    97.9 F (36.6 C)  TempSrc:    Oral  Resp: 19 15 17 22   Height:      Weight:      SpO2: 94% 96% 99% 96%    Intake/Output Summary (Last 24 hours) at 07/18/14 1012 Last data filed at 07/18/14 0600  Gross per 24 hour  Intake 1064.54 ml  Output    150 ml  Net 914.54 ml   Filed Weights   07/17/14 2222 07/18/14 0055  Weight: 200 lb (90.719 kg) 201 lb 11.5 oz (91.5 kg)    PHYSICAL EXAM  General: Pleasant, NAD. Neuro: Alert and oriented X 3. Moves all extremities spontaneously. Psych: Normal affect. HEENT:  Normal  Neck: Supple without bruits or JVD. Lungs:  Resp regular and unlabored, CTA. Heart: RRR no s3, s4, or murmurs. Abdomen: Soft, non-tender, non-distended, BS + x 4.  Extremities: No clubbing, cyanosis or edema. DP/PT/Radials 2+ and equal bilaterally.  Accessory Clinical Findings  CBC  Recent Labs  07/17/14 2212 07/17/14 2217 07/18/14 0315  WBC 15.5*  --  13.9*  NEUTROABS 11.8*  --   --   HGB 15.3 16.3 14.3  HCT 44.6 48.0 41.9  MCV 88.7  --  87.8  PLT 174  --  517   Basic Metabolic Panel  Recent Labs  07/17/14 2212 07/17/14 2217 07/18/14 0315  NA 131* 132* 136  K 5.4* 5.3* 4.4  CL 102 101 105  CO2 19  --  25  GLUCOSE 288* 297* 130*  BUN 25* 30* 25*  CREATININE  1.67* 1.30 1.37*  CALCIUM 8.9  --  8.6   Liver Function Tests No results for input(s): AST, ALT, ALKPHOS, BILITOT, PROT, ALBUMIN in the last 72 hours. No results for input(s): LIPASE, AMYLASE in the last 72 hours. Cardiac Enzymes  Recent Labs  07/17/14 2212 07/17/14 2336 07/18/14 0315  TROPONINI 3.21* 3.32* 3.01*    Recent Labs  07/17/14 2336  TSH 3.775    Radiology/Studies  Dg Chest Port 1 View  07/17/2014   CLINICAL DATA:  Acute onset of shortness of breath and decreased heart rate. Initial encounter.  EXAM: PORTABLE CHEST - 1 VIEW  COMPARISON:  Chest radiograph performed 01/02/2009  FINDINGS: The lungs are well-aerated and clear. There is no evidence of focal opacification, pleural effusion or pneumothorax.  The cardiomediastinal silhouette is borderline normal in size. No acute osseous abnormalities are seen. External pacing pads are noted.  IMPRESSION: No acute cardiopulmonary process seen.   Electronically Signed   By: Garald Balding M.D.   On: 07/17/2014 23:00    TELE: V-paced rhythm  ECG     ASSESSMENT AND PLAN  Dakota [name] is an 81  yo man with h/o chronic AF on carvedilol who presented with fatigue, somnolence, slurred speech and syncope and was found to have symptomatic bradycardia with HR 20s-30 and hypotensive with SBP 80 --> intermittent unresopnsiveness, elevated lactic acid.  1. A-fib with slow ventricular response  - Symptomatic bradycardia  - s/p temporary PM insertion by Dr Martinique  - carvedilol was held yesterday (we need at least 48 hours to wash out) - Dobutamine was stopped at 3 am  - TSH normal   I tried to decrease temporary pacemaker output and the patient went into asystole.  He has elavated troponin that remains the same, possibly secondary to demand ischemia with low BP and low HR yesterday and ARI. No prior ischemic work up in the past. We will order echocardiogram to evaluate for LVEF and wall motion abnormalities. If abnormal we will  consider cath /stress test considering acute renal failure.   Carvedilol was discontinued just yesterday.  We will call EP consult.    Signed, Dorothy Spark MD, [facility] [place] 07/18/2014

## 2014-07-18 NOTE — CV Procedure (Signed)
   Name: Dakota [name] MRN: [id number] DOB: 08/10/1928  Procedure: Temporary Transvenous Pacemaker insertion  Indication: 81 yo WM with symptomatic bradycardia with HR in 30s and intermittent unresponsiveness.   Procedure description:  The right groin was prepped and draped in a sterile fashion. Local anesthesia was provided with 1% Lidocaine. Using a modified Seldinger technique a 6 French sheath was inserted into the right femoral vein. Under fluoroscopic guidance a balloon tip pacing wire was advanced via the sheath into the right ventricular apex. Adequate Pacemaker capture and thresholds were documented. The sheath was sutured in place. The patient was transferred to the ICU for further monitoring. There were no complications.   Impression: Successful placement of temporary transvenous pacemaker.  Peter Martinique MD, [facility]    07/18/2014  1:52 AM

## 2014-07-18 NOTE — Discharge Instructions (Signed)
° ° °  Supplemental Discharge Instructions for  Pacemaker/Defibrillator Patients  Activity No heavy lifting or vigorous activity with your left/right arm for 6 to 8 weeks.  Do not raise your left/right arm above your head for one week.  Gradually raise your affected arm as drawn below.           __   07-23-14                          07-24-14                  07-25-14                     07-26-14  NO DRIVING for  Until after appt 08-04-14  WOUND CARE - Keep the wound area clean and dry.  Do not get this area wet for one week. No showers for one week; you may shower on   07-26-14  . - The tape/steri-strips on your wound will fall off; do not pull them off.  No bandage is needed on the site.  DO  NOT apply any creams, oils, or ointments to the wound area. - If you notice any drainage or discharge from the wound, any swelling or bruising at the site, or you develop a fever > 101? F after you are discharged home, call the office at once.  Special Instructions - You are still able to use cellular telephones; use the ear opposite the side where you have your pacemaker/defibrillator.  Avoid carrying your cellular phone near your device. - When traveling through airports, show security personnel your identification card to avoid being screened in the metal detectors.  Ask the security personnel to use the hand wand. - Avoid arc welding equipment, MRI testing (magnetic resonance imaging), TENS units (transcutaneous nerve stimulators).  Call the office for questions about other devices. - Avoid electrical appliances that are in poor condition or are not properly grounded. - Microwave ovens are safe to be near or to operate.

## 2014-07-18 NOTE — Progress Notes (Signed)
Upon pt arrival to 2H14 and moving pt from ED stretcher to ICU bed, pt went asystole and pulseless while also unresponsive. Chest compressions performed for about 5-10 seconds until ROSC and pt responsive. Pt externally paced with zoll, without constant capturing. Zoll pads changed to obtain better capturing. RN immedately paged cardiologist MD, Philbert Riser. MD Philbert Riser was a few doors down on the unit, and upon his arrival ordered increase of dobutamine and to run NS wide open. Pt continued to have unresponsive episodes. Philbert Riser ordered Dopamine and to increase external pacing mA until capture noted. At 64 mA capture became consistent. Cath lab called in for insertion of temp pacer. Will continue to monitor pt closely.

## 2014-07-18 NOTE — Progress Notes (Signed)
Pt's trans-venous temporary pacer failed to capture.  mA increased form 5-25. Still no capture. Hr in 20's. Patient diaphoretic, grey, lethargic. Transcutaneous pads placed on patient, first set did not capture, second set placed on patient and mA increased until capture obtained. Patient paced successfully. Attending MD and EP MD paged. Allred responded to patient's bedside. Decision made to take patient immediately to the cath lab to have heart cath and permanent pacemaker placed. Son at bedside, signed consent for permant pacemaker.    Roxan Hockey, RN

## 2014-07-18 NOTE — H&P (Signed)
Expand All Collapse All      HPI: [name] is an 81 yo man with h/o chronic AF on carvedilol who presented with fatigue and syncope. He is here with his son who helps with the history as patient is somnolent and speech is slightly slurred. 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 However, Monday night he apparently experienced some chest discomfort. Pt describes this as being located in center to right chest. This apparently was a constant until Tuesday at some point. Son states they took his mother to an appointment yesterday and his father denied any pain at that time. However, he has continued to have pronounced fatigue during this time frame. More concerning was a frank syncopal episode that occurred this evening prompting visit to ED. Per report, upon arrival and moving him he passed out again.   In ED he was noted to be bradycardic with HR 20s-30 and hypotensive with SBP 80 prompting cardiology consult. Given his labs showing lactic acidosis and AKI I asked they they begin fluids and dobutamine. Upon arrival to ED, HR 40s with SBP 90 on 5 mcg/kg/min of dobutamine. Tele and ECG showed AF with escape rhythm. At that time, he denies any CP or SOB. Prior to leaving ED, HR 65 bpm with SBP 110s and MAP approaching 80 mmHg. Heparin drip started given elevated troponin in case of ACS though likely represents demand ischemia.   Upon arrival to CCU, he went unresponsive and lost pulses briefly. RN paged to notify, I immediately arrived as I was a few doors down and patient was responsive. They were attempting to externally pace but there was no capture. Dobutamine increased and output increased. Pacer pads exchanged due to lack of consistent capture. A few more of these episodes occurred prompting initiation of dopamine. Pacer output increased progressively to 64 mA with consistent capture. Dobutamine and dopamine continued and case  discussed with interventional cardiologist and cath lab activated for temporary pacemaker placement.    Review of Systems:   Cardiac Review of Systems: {Y] = yes [ ]  = no Chest Pain [ x ] Resting SOB [ ] Exertional SOB [ ]  Orthopnea [ ]  Pedal Edema [ ]  Palpitations [ ] Syncope [x ] Presyncope [ ]  General Review of Systems: [Y] = yes [ ] =no Constitional: recent weight change [ ] ; anorexia [ x ]; fatigue [ ] ; nausea [ ] ; night sweats [ ] ; fever [ ] ; or chills [ ] ;  Dental: poor dentition[ ] ;  Eye : blurred vision [ ] ; diplopia [ ] ; vision changes [ ] ; Amaurosis fugax[ ] ; Resp: cough [ ] ; wheezing[ ] ; hemoptysis[ ] ; shortness of breath[ ] ; paroxysmal nocturnal dyspnea[ ] ; dyspnea on exertion[ ] ; or orthopnea[ ] ;  GI: gallstones[ ] , vomiting[ ] ; dysphagia[ ] ; melena[ ] ; hematochezia [ ] ; heartburn[ ] ;  GU: kidney stones [ ] ; hematuria[ ] ; dysuria [ ] ; nocturia[ ] ;   Skin: rash [ ] , swelling[ ] ;, hair loss[ ] ; peripheral edema[ ] ; or itching[ ] ; Musculosketetal: myalgias[ ] ; joint swelling[ ] ; joint erythema[ ] ; joint pain[ ] ; back pain[ ] ; Heme/Lymph: bruising[ ] ; bleeding[ ] ; anemia[ ] ;  Neuro: TIA[ ] ; headaches[ ] ; stroke[ ] ; vertigo[ ] ; seizures[ ] ; paresthesias[ ] ; difficulty walking[ ] ; Psych:depression[ ] ; anxiety[ ] ; Endocrine: diabetes[ ] ; thyroid dysfunction[ ] ; Other:  Past Medical History  Diagnosis Date  . GERD (gastroesophageal reflux disease) 10/2002  . Hyperlipidemia 12/1994  . Hypertension   . Chronic atrial fibrillation  not on coumadin therapy  . Back pain, chronic   . Lung collapse 03/1991    Fall    No current  facility-administered medications on file prior to encounter.   Current Outpatient Prescriptions on File Prior to Encounter  Medication Sig Dispense Refill  . ALPRAZolam (XANAX) 0.5 MG tablet TAKE ONE-HALF TABLET BY MOUTH TWICE DAILY AS NEEDED FOR ANXIETY 60 tablet 2  . aspirin 81 MG EC tablet Take 81 mg by mouth daily.     . calcium carbonate (TUMS - DOSED IN MG ELEMENTAL CALCIUM) 500 MG chewable tablet Chew 1 tablet by mouth daily as needed for indigestion.     . carvedilol (COREG) 25 MG tablet Take 1 tablet (25 mg total) by mouth 2 (two) times daily with a meal. 180 tablet 3  . ranitidine (ZANTAC) 150 MG tablet Take 150 mg by mouth daily.        Allergies  Allergen Reactions  . Atorvastatin     REACTION: MUSCLE PAIN  . Diazepam     REACTION: UNSPECIFIED  . Ezetimibe     REACTION: MUSCLE PAIN  . Penicillins     REACTION: UNSPECIFIED    History   Social History  . Marital Status: Married    Spouse Name: N/A  . Number of Children: 4  . Years of Education: N/A   Occupational History  . retired     Architect   Social History Main Topics  . Smoking status: Former Smoker -- 1.00 packs/day for 15 years    Quit date: 05/02/1953  . Smokeless tobacco: Current User    Types: Chew     Comment: Quit smoking over 50 years ago  . Alcohol Use: No  . Drug Use: No  . Sexual Activity: No   Other Topics Concern  . Not on file   Social History Narrative   Retired from [employer], building/construction   Married 1953   4 kids   Caring for his wife at home- source of anxiety for patient    Family History  Problem Relation Age of Onset  . Pneumonia Mother   . Hip fracture Mother   . Cancer Father     splenic  . Heart disease Sister     Pacer placed  . Cancer Brother     throat and lung  . Heart  disease Son   . Stroke Neg Hx   . Colon cancer Neg Hx   . Cancer Brother     prostate, age 70  . Prostate cancer Brother     PHYSICAL EXAM: Filed Vitals:   07/17/14 2345  BP: 109/89  Pulse: 62  Temp:   Resp: 25   General: Elderly gentleman, somnolent, ill appearing. No respiratory difficulty HEENT: dry MM, no OP lesions, atraumatic, PERRL, EOMI, anicteric Neck: supple. no JVD. Carotids 2+ bilat; no bruits. No lymphadenopathy or thryomegaly appreciated. Cor: PMI nondisplaced. Bradycardic, normal S1, S2. No rubs, gallops or murmurs. Lungs: clear anterior fields Abdomen: soft, nontender, nondistended. No hepatosplenomegaly. No bruits or masses. Good bowel sounds. Extremities: no cyanosis, clubbing, rash, edema.  Cool to touch Neuro: somnolent.  moves all 4 extremities w/o difficulty.   ECG: AF with CHB and ventricular escape at 30 bpm.   Lab Results Last 24 Hours    Results for orders placed or performed during the hospital encounter of 07/17/14 (from the past 24 hour(s))  Basic metabolic panel Status: Abnormal   Collection Time: 07/17/14 10:12 PM  Result Value Ref Range  Sodium 131 (L) 135 - 145 mmol/L   Potassium 5.4 (H) 3.5 - 5.1 mmol/L   Chloride 102 96 - 112 mmol/L   CO2 19 19 - 32 mmol/L   Glucose, Bld 288 (H) 70 - 99 mg/dL   BUN 25 (H) 6 - 23 mg/dL   Creatinine, Ser 1.67 (H) 0.50 - 1.35 mg/dL   Calcium 8.9 8.4 - 10.5 mg/dL   GFR calc non Af Amer 36 (L) >90 mL/min   GFR calc Af Amer 42 (L) >90 mL/min   Anion gap 10 5 - 15  CBC with Differential/Platelet Status: Abnormal   Collection Time: 07/17/14 10:12 PM  Result Value Ref Range   WBC 15.5 (H) 4.0 - 10.5 K/uL   RBC 5.03 4.22 - 5.81 MIL/uL   Hemoglobin 15.3 13.0 - 17.0 g/dL   HCT 44.6 39.0 - 52.0 %   MCV 88.7 78.0 - 100.0 fL   MCH 30.4 26.0 - 34.0 pg   MCHC 34.3 30.0 - 36.0 g/dL    RDW 13.3 11.5 - 15.5 %   Platelets 174 150 - 400 K/uL   Neutrophils Relative % 76 43 - 77 %   Neutro Abs 11.8 (H) 1.7 - 7.7 K/uL   Lymphocytes Relative 14 12 - 46 %   Lymphs Abs 2.2 0.7 - 4.0 K/uL   Monocytes Relative 9 3 - 12 %   Monocytes Absolute 1.4 (H) 0.1 - 1.0 K/uL   Eosinophils Relative 1 0 - 5 %   Eosinophils Absolute 0.1 0.0 - 0.7 K/uL   Basophils Relative 0 0 - 1 %   Basophils Absolute 0.1 0.0 - 0.1 K/uL  APTT Status: None   Collection Time: 07/17/14 10:12 PM  Result Value Ref Range   aPTT 31 24 - 37 seconds  Protime-INR Status: Abnormal   Collection Time: 07/17/14 10:12 PM  Result Value Ref Range   Prothrombin Time 16.5 (H) 11.6 - 15.2 seconds   INR 1.32 0.00 - 1.49  Troponin I Status: Abnormal   Collection Time: 07/17/14 10:12 PM  Result Value Ref Range   Troponin I 3.21 (HH) <0.031 ng/mL  Brain natriuretic peptide Status: Abnormal   Collection Time: 07/17/14 10:12 PM  Result Value Ref Range   B Natriuretic Peptide 578.5 (H) 0.0 - 100.0 pg/mL  I-stat troponin, ED Status: Abnormal   Collection Time: 07/17/14 10:14 PM  Result Value Ref Range   Troponin i, poc 2.76 (HH) 0.00 - 0.08 ng/mL   Comment NOTIFIED PHYSICIAN    Comment 3     I-stat chem 8, ed Status: Abnormal   Collection Time: 07/17/14 10:17 PM  Result Value Ref Range   Sodium 132 (L) 135 - 145 mmol/L   Potassium 5.3 (H) 3.5 - 5.1 mmol/L   Chloride 101 96 - 112 mmol/L   BUN 30 (H) 6 - 23 mg/dL   Creatinine, Ser 1.30 0.50 - 1.35 mg/dL   Glucose, Bld 297 (H) 70 - 99 mg/dL   Calcium, Ion 1.08 (L) 1.13 - 1.30 mmol/L   TCO2 17 0 - 100 mmol/L   Hemoglobin 16.3 13.0 - 17.0 g/dL   HCT 48.0 39.0 - 52.0 %  I-Stat CG4 Lactic Acid, ED Status: Abnormal   Collection Time: 07/17/14 10:18 PM  Result Value Ref Range    Lactic Acid, Venous 4.03 (HH) 0.5 - 2.0 mmol/L   Comment NOTIFIED PHYSICIAN   Troponin I-(serum) Status: Abnormal   Collection Time: 07/17/14 11:36 PM  Result Value Ref Range  Troponin I 3.32 (HH) <0.031 ng/mL      Imaging Results (Last 48 hours)    Dg Chest Port 1 View  07/17/2014 CLINICAL DATA: Acute onset of shortness of breath and decreased heart rate. Initial encounter. EXAM: PORTABLE CHEST - 1 VIEW COMPARISON: Chest radiograph performed 01/02/2009 FINDINGS: The lungs are well-aerated and clear. There is no evidence of focal opacification, pleural effusion or pneumothorax. The cardiomediastinal silhouette is borderline normal in size. No acute osseous abnormalities are seen. External pacing pads are noted. IMPRESSION: No acute cardiopulmonary process seen. Electronically Signed By: Garald Balding M.D. On: 07/17/2014 23:00      ASSESSMENT:  81 yo man with chronic AF presenting with CHB and slow ventricular escape rhythm, cardiogenic shock, AKI, lactic acidosis and troponin elevation likely from hypoperfusion/demand-ischemia.    PLAN/DISCUSSION: Admit to CCU Discontinue carvedilol Dobutamine/dopamine infusions Prn atropine External pacing Awaiting cath lab for temporary pacemaker placement IVF Cycle troponins Hold heparin drip prior to cath lab TSH, T4 NPO Will need EP consult for permanent pacemaker implantation in AM. AM lactic acid, BMP  The patient is critically ill with multiple organ systems failure and requires high complexity decision making for assessment and support, frequent evaluation and titration of therapies, application of advanced monitoring technologies and extensive interpretation of multiple databases.   Critical Care Time devoted to patient care services described in this note is62minutes. This time reflects time of care of this signee: Alphia Moh, MD   Ova Meegan 1:20 AM 07/18/2014.

## 2014-07-18 NOTE — Interval H&P Note (Signed)
Cath Lab Visit (complete for each Cath Lab visit)  Clinical Evaluation Leading to the Procedure:   ACS: Yes.    Non-ACS:    Anginal Classification: CCS IV  Anti-ischemic medical therapy: Minimal Therapy (1 class of medications)  Non-Invasive Test Results: No non-invasive testing performed  Prior CABG: No previous CABG   Indication 13  Patient Information:    Patients with acute myocardial infarction (STEMI or NSTEMI)  Evidence of cardiogenic shock   Revascularization of 1 OR MORE coronary arteries    A (8)  Indication: 13; Score: 8    History and Physical Interval Note:  07/18/2014 3:03 PM  Dakota [name]  has presented today for surgery, with the diagnosis of NSTEMI  The various methods of treatment have been discussed with the patient and family. After consideration of risks, benefits and other options for treatment, the patient has consented to  Procedure(s): LEFT HEART CATHETERIZATION WITH CORONARY ANGIOGRAM (N/A) as a surgical intervention .  The patient's history has been reviewed, patient examined, no change in status, stable for surgery.  I have reviewed the patient's chart and labs.  Questions were answered to the patient's satisfaction.     Dakota [name] S.

## 2014-07-18 NOTE — Progress Notes (Signed)
eLink Physician-Brief Progress Note Patient Name: Dakota [name] DOB: 08-Feb-1929 MRN: [id number]   Date of Service  07/18/2014  HPI/Events of Note  Patient admitted with bradycardia d/t 3rd degree. Upon arrival to ICU he began to have syncopal episodes. Cardiology Fellow at bedside. Take emergently to cardiac cath lab for temporary pacer placement.  eICU Interventions  Continue management per Cardiology.     Intervention Category Evaluation Type: New Patient Evaluation  Lysle Dingwall 07/18/2014, 2:40 AM

## 2014-07-18 NOTE — Op Note (Signed)
SURGEON:  Thompson Grayer, MD     PREPROCEDURE DIAGNOSIS:  Permanent atrial fibrillation, complete heart block    POSTPROCEDURE DIAGNOSIS:  Permanent atrial fibrillation, complete heart block     PROCEDURES:   1.   Pacemaker implantation.     INTRODUCTION: Dakota [name] is a 81 y.o. male  with a history of atrial fibrillation and CAD who presents today for pacemaker implantation for complete heart block.  The patient has had progressive decline over the past few days.  He underwent temporary pacing wire placement which subsequently lost capture requiring transcutaneous pacing.  His pacing was could not be successfully repositioned despite even replacing the wire by Dr Irish Lack.  He presents emergently to the EP lab in extremis with transcutaneous pacing, failure of his temporary pacing wire to capture, and acute metabolic acidosis/ hypoxia.    DESCRIPTION OF PROCEDURE:  Informed written consent was obtained, and the patient was brought to the electrophysiology lab in a fasting state.  The patient received no sedation for the procedure today.  The patients left chest was emergently prepped and draped in the usual sterile fashion by the EP lab staff. The skin overlying the left deltopectoral region was infiltrated with lidocaine for local analgesia.  A 4-cm incision was made over the left deltopectoral region.  A left subcutaneous pacemaker pocket was fashioned using a combination of sharp and blunt dissection. Electrocautery was required to assure hemostasis.    RV Lead Placement: The left axillary vein was cannulated.  No contrast was required.  Through the left axillary vein, a Medtronic model [id number] (serial number [id number]) right ventricular lead was advanced with fluoroscopic visualization into the right ventricular apical septum position.  Right ventricular lead R-waves were not present due to asytole upon presentation despite attempts to transcutaneously pace.  The RV lead impedance was 979  ohms with a threshold of 0.7 V at 0.5 msec.  The lead was secured to the pectoralis fascia using #2-0 silk over the suture sleeve.   Device Placement:  The lead was then connected to a Medtronic [place] model [id number] (serial number [id number]) pacemaker.  The pocket was irrigated with copious gentamicin solution.  The pacemaker was then placed into the pocket.  The pocket was then closed in 2 layers with 2.0 Vicryl suture for the subcutaneous and subcuticular layers.  Steri- Strips and a sterile dressing were then applied. The temporary pacing wire was carefully removed under fluoroscopic visualization without affecting the RV lead.  EBL<6ml. There were no early apparent complications.  No contrast was required for this procedure today.     CONCLUSIONS:   1. Successful implantation of a Medtronic Sensia single-chamber pacemaker for symptomatic complete heart block  2. No early apparent complications.           Thompson Grayer, MD 07/18/2014 4:43 PM

## 2014-07-18 NOTE — Progress Notes (Addendum)
Patient Name: Dakota [name] Date of Encounter: 07/18/2014  Active Problems:   Atrial fibrillation   CHB (complete heart block)   ARF (acute renal failure)   Lactic acidosis   Cardiogenic shock   Elevated troponin level   Length of Stay: 1  SUBJECTIVE  The patient feels tired today, denies chest pain or SOB.   CURRENT MEDS . antiseptic oral rinse  7 mL Mouth Rinse BID  . aspirin EC  81 mg Oral Daily   . sodium chloride 10 mL/hr at 07/18/14 0200  . DOBUTamine Stopped (07/18/14 0253)  . heparin Stopped (07/18/14 0130)   OBJECTIVE  Filed Vitals:   07/18/14 0500 07/18/14 0600 07/18/14 0700 07/18/14 0744  BP: 115/64 101/60 132/99 127/86  Pulse: 79 80 79 80  Temp:    97.9 F (36.6 C)  TempSrc:    Oral  Resp: 19 15 17 22   Height:      Weight:      SpO2: 94% 96% 99% 96%    Intake/Output Summary (Last 24 hours) at 07/18/14 1012 Last data filed at 07/18/14 0600  Gross per 24 hour  Intake 1064.54 ml  Output    150 ml  Net 914.54 ml   Filed Weights   07/17/14 2222 07/18/14 0055  Weight: 200 lb (90.719 kg) 201 lb 11.5 oz (91.5 kg)    PHYSICAL EXAM  General: Pleasant, NAD. Neuro: Alert and oriented X 3. Moves all extremities spontaneously. Psych: Normal affect. HEENT:  Normal  Neck: Supple without bruits or JVD. Lungs:  Resp regular and unlabored, CTA. Heart: RRR no s3, s4, or murmurs. Abdomen: Soft, non-tender, non-distended, BS + x 4.  Extremities: No clubbing, cyanosis or edema. DP/PT/Radials 2+ and equal bilaterally.  Accessory Clinical Findings  CBC  Recent Labs  07/17/14 2212 07/17/14 2217 07/18/14 0315  WBC 15.5*  --  13.9*  NEUTROABS 11.8*  --   --   HGB 15.3 16.3 14.3  HCT 44.6 48.0 41.9  MCV 88.7  --  87.8  PLT 174  --  818   Basic Metabolic Panel  Recent Labs  07/17/14 2212 07/17/14 2217 07/18/14 0315  NA 131* 132* 136  K 5.4* 5.3* 4.4  CL 102 101 105  CO2 19  --  25  GLUCOSE 288* 297* 130*  BUN 25* 30* 25*  CREATININE  1.67* 1.30 1.37*  CALCIUM 8.9  --  8.6   Liver Function Tests No results for input(s): AST, ALT, ALKPHOS, BILITOT, PROT, ALBUMIN in the last 72 hours. No results for input(s): LIPASE, AMYLASE in the last 72 hours. Cardiac Enzymes  Recent Labs  07/17/14 2212 07/17/14 2336 07/18/14 0315  TROPONINI 3.21* 3.32* 3.01*    Recent Labs  07/17/14 2336  TSH 3.775    Radiology/Studies  Dg Chest Port 1 View  07/17/2014   CLINICAL DATA:  Acute onset of shortness of breath and decreased heart rate. Initial encounter.  EXAM: PORTABLE CHEST - 1 VIEW  COMPARISON:  Chest radiograph performed 01/02/2009  FINDINGS: The lungs are well-aerated and clear. There is no evidence of focal opacification, pleural effusion or pneumothorax.  The cardiomediastinal silhouette is borderline normal in size. No acute osseous abnormalities are seen. External pacing pads are noted.  IMPRESSION: No acute cardiopulmonary process seen.   Electronically Signed   By: Garald Balding M.D.   On: 07/17/2014 23:00    TELE: V-paced rhythm  ECG     ASSESSMENT AND PLAN  Dakota [name] is an 81  yo man with h/o chronic AF on carvedilol who presented with fatigue, somnolence, slurred speech and syncope and was found to have symptomatic bradycardia with HR 20s-30 and hypotensive with SBP 80 --> intermittent unresopnsiveness, elevated lactic acid.  1. A-fib with slow ventricular response  - Symptomatic bradycardia  - s/p temporary PM insertion by Dr Martinique  - carvedilol was held yesterday (we need at least 48 hours to wash out) - Dobutamine was stopped at 3 am  - TSH normal   I tried to decrease temporary pacemaker output and the patient went into asystole.  He has elavated troponin that remains the same, possibly secondary to demand ischemia with low BP and low HR yesterday and ARI. No prior ischemic work up in the past. We will order echocardiogram to evaluate for LVEF and wall motion abnormalities. If abnormal we will  consider cath /stress test considering acute renal failure.   Carvedilol was discontinued just yesterday.  We will call EP consult.    Signed, Dorothy Spark MD, [facility] 07/18/2014

## 2014-07-18 NOTE — Care Management Note (Addendum)
    Page 1 of 1   07/22/2014     12:11:56 PM CARE MANAGEMENT NOTE 07/22/2014  Patient:  Dakota [name], Dakota [name]   Account Number:  [id number]  Date Initiated:  07/18/2014  Documentation initiated by:  Elissa Hefty  Subjective/Objective Assessment:   adm w complete heart block     Action/Plan:   lives w wife, pcp dr [name]   Anticipated DC Date:     Anticipated DC Plan:  [place]   Choice offered to / List presented to:  C-4 Adult Children        HH arranged  HH-2 PT      [place].   Status of service:  Completed, signed off Medicare Important Message given?  YES (If response is "NO", the following Medicare IM given date fields will be blank) Date Medicare IM given:  07/21/2014 Medicare IM given by:  GRAVES-BIGELOW,Tanita Palinkas Date Additional Medicare IM given:   Additional Medicare IM given by:    Discharge Disposition:  [place]  Per UR Regulation:  Reviewed for med. necessity/level of care/duration of stay  If discussed at [place] of Stay Meetings, dates discussed:   07/22/2014    Comments:  07-22-14 1208 Jacqlyn Krauss, RN,BSN [phone] CM did speak with pt's son in ref to [place] vs SNF. Per son they will have a caregiver in the home for 8 hrs per day and the children will alternate at night to care for parents. CM did make referral with [facility] for PT services. SOC to begin within 24-48 hrs of d/c. Orders and F2F will need to be placed in EPIC. No further needs from CM at this time.

## 2014-07-18 NOTE — Progress Notes (Signed)
Pt continuously moves left arm post procedure. Family at bedside to help remind patient not to use affected arm.

## 2014-07-18 NOTE — CV Procedure (Addendum)
       PROCEDURE:  Left heart catheterization with selective coronary angiography, temporary pacer wire placement.  INDICATIONS:  Symptomatic bradycardia, non-ST elevation MI, cardiogenic shock due to bradycardia, complete heart block  The risks, benefits, and details of the procedure were explained to the patient.  The patient verbalized understanding and wanted to proceed.  Informed written consent was obtained.  PROCEDURE TECHNIQUE:  After Xylocaine anesthesia a 43F slender sheath was placed in the right radial artery with a single anterior needle wall stick.   IV Heparin was given.  Right coronary angiography was done using a Judkins R4 guide catheter.  Left coronary angiography was done using a Judkins L3.5 guide catheter.  Left heart catheterization was done using the JL 3.5.  A TR band was used for hemostasis.  The right femoral vein sheath was exchanged over a wire using the modified Seldinger technique. A 6 French balloontipped temporary pacemaker wire was advanced to the right ventricle under fluoroscopic guidance. The patient was capturing until the output was decreased to 15 mA. Then it became intermittent. The pacer was placed to 25 mA output. This sheath was sutured in place. He'll be transferred to the EP lab for permanent pacemaker placement.  During transport, he had a very brief episode where he lost capture on the pacemaker and was asystolic. He regained capture. He did not require CPR.   CONTRAST:  Total of 35 cc.  COMPLICATIONS:  None.    HEMODYNAMICS:  Aortic pressure was 99/56; LV pressure was 98/15; LVEDP 26.  There was no gradient between the left ventricle and aorta.    ANGIOGRAPHIC DATA:   The left main coronary artery is widely patent.  The left anterior descending artery is a large vessel which wraps around the apex. There is mild, diffuse disease. The first diagonal is a small vessel with a proximal 80% stenosis. The second diagonal is a medium-size vessel which is  widely patent.  The left circumflex artery is a large dominant vessel. The first obtuse marginal has mild proximal stenosis. The second obtuse marginal is medium sized and has mild disease. The third obtuse marginal is medium size with mild proximal disease. The circumflex appears widely patent until the origin of the PDA. The vessel appears occluded at that point. There is some collateral filling of the distal PDA from the LAD.  The right coronary artery is a small nondominant which is patent.  LEFT VENTRICULOGRAM:  Left ventricular angiogram was not done.  LVEDP was 22 mmHg.  IMPRESSIONS:  1. Normal left main coronary artery. 2. Mild to moderate disease in the left anterior descending artery and its branches. 3. Patent left circumflex artery.  Occluded left PDA. 4. Nondominant right coronary artery. 5. LVEDP 26 mmHg.  Ejection fraction not assessed.  RECOMMENDATION:  It is likely that his left PDA occlusion is subacute. He is not having angina at this time. I don't think there will be any benefit and revascularizing this lesion. He will be transferred to the EP lab for permanent pacemaker placement.

## 2014-07-18 NOTE — Progress Notes (Signed)
Orthopedic Tech Progress Note Patient Details:  Dakota [name] 05-08-1928 [id number] Patient already has arm sling. Patient ID: Dakota [name], male   DOB: [birth year], 81 y.o.   MRN: [id number]   Braulio Bosch 07/18/2014, 5:52 PM

## 2014-07-18 NOTE — H&P (Deleted)
HPI: Mr Barbato is an 81 yo man with h/o chronic AF on carvedilol who presented with fatigue and syncope.  He is here with his son who helps with the history as patient is somnolent and speech is slightly slurred.  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  However, Monday night he apparently experienced some chest discomfort.  Pt describes this as being located in center to right chest.  This apparently was a constant until Tuesday at some point.  Son states they took his mother to an appointment yesterday and his father denied any pain at that time.  However, he has continued to have pronounced fatigue during this time frame.  More concerning was a frank syncopal episode that occurred this evening prompting visit to ED.  Per report, upon arrival and moving him he passed out again.    In ED he was noted to be bradycardic with HR 20s-30 and hypotensive with SBP 80 prompting cardiology consult.  Given his labs showing lactic acidosis and AKI I asked they they begin fluids and dobutamine.  Upon arrival to ED, HR 40s with SBP 90 on 5 mcg/kg/min of dobutamine.  Tele and ECG showed AF with escape rhythm.  At that time, he denies any CP or SOB.  Prior to leaving ED, HR 65 bpm with SBP 110s and MAP approaching 80 mmHg.  Heparin drip started given elevated troponin in case of ACS though likely represents demand ischemia.     Upon arrival to CCU, he went unresponsive and lost pulses briefly.  RN paged to notify, I immediately arrived as I was a few doors down and patient was responsive.  They were attempting to externally pace but there was no capture.  Dobutamine increased and output increased.  Pacer pads exchanged due to lack of consistent capture.  A few more of these episodes occurred prompting initiation of dopamine.  Pacer output increased progressively to 64 mA with consistent capture.  Dobutamine and dopamine continued and case discussed with  interventional cardiologist and cath lab activated for temporary pacemaker placement.    Review of Systems:     Cardiac Review of Systems: {Y] = yes [ ]  = no  Chest Pain [ x   ]  Resting SOB [   ] Exertional SOB  [  ]  Orthopnea [  ]   Pedal Edema [   ]    Palpitations [  ] Syncope  [x  ]   Presyncope [   ]  General Review of Systems: [Y] = yes [  ]=no Constitional: recent weight change [  ]; anorexia [ x ]; fatigue [  ]; nausea [  ]; night sweats [  ]; fever [  ]; or chills [  ];                                                                     Dental: poor dentition[  ];   Eye : blurred vision [  ]; diplopia [   ]; vision changes [  ];  Amaurosis fugax[  ]; Resp: cough [  ];  wheezing[  ];  hemoptysis[  ]; shortness of breath[  ]; paroxysmal nocturnal  dyspnea[  ]; dyspnea on exertion[  ]; or orthopnea[  ];  GI:  gallstones[  ], vomiting[  ];  dysphagia[  ]; melena[  ];  hematochezia [  ]; heartburn[  ];   GU: kidney stones [  ]; hematuria[  ];   dysuria [  ];  nocturia[  ];               Skin: rash [  ], swelling[  ];, hair loss[  ];  peripheral edema[  ];  or itching[  ]; Musculosketetal: myalgias[  ];  joint swelling[  ];  joint erythema[  ];  joint pain[  ];  back pain[  ];  Heme/Lymph: bruising[  ];  bleeding[  ];  anemia[  ];  Neuro: TIA[  ];  headaches[  ];  stroke[  ];  vertigo[  ];  seizures[  ];   paresthesias[  ];  difficulty walking[  ];  Psych:depression[  ]; anxiety[  ];  Endocrine: diabetes[  ];  thyroid dysfunction[  ];  Other:  Past Medical History  Diagnosis Date  . GERD (gastroesophageal reflux disease) 10/2002  . Hyperlipidemia 12/1994  . Hypertension   . Chronic atrial fibrillation     not on coumadin therapy  . Back pain, chronic   . Lung collapse 03/1991    Fall    No current facility-administered medications on file prior to encounter.   Current Outpatient Prescriptions on File Prior to Encounter  Medication Sig Dispense Refill  . ALPRAZolam (XANAX) 0.5  MG tablet TAKE ONE-HALF TABLET BY MOUTH TWICE DAILY AS NEEDED FOR ANXIETY 60 tablet 2  . aspirin 81 MG EC tablet Take 81 mg by mouth daily.      . calcium carbonate (TUMS - DOSED IN MG ELEMENTAL CALCIUM) 500 MG chewable tablet Chew 1 tablet by mouth daily as needed for indigestion.     . carvedilol (COREG) 25 MG tablet Take 1 tablet (25 mg total) by mouth 2 (two) times daily with a meal. 180 tablet 3  . ranitidine (ZANTAC) 150 MG tablet Take 150 mg by mouth daily.        Allergies  Allergen Reactions  . Atorvastatin     REACTION: MUSCLE PAIN  . Diazepam     REACTION: UNSPECIFIED  . Ezetimibe     REACTION: MUSCLE PAIN  . Penicillins     REACTION: UNSPECIFIED    History   Social History  . Marital Status: Married    Spouse Name: N/A  . Number of Children: 4  . Years of Education: N/A   Occupational History  . retired     Architect   Social History Main Topics  . Smoking status: Former Smoker -- 1.00 packs/day for 15 years    Quit date: 05/02/1953  . Smokeless tobacco: Current User    Types: Chew     Comment: Quit smoking over 50 years ago  . Alcohol Use: No  . Drug Use: No  . Sexual Activity: No   Other Topics Concern  . Not on file   Social History Narrative   Retired from [employer], building/construction   Married 1953   4 kids   Caring for his wife at home- source of anxiety for patient    Family History  Problem Relation Age of Onset  . Pneumonia Mother   . Hip fracture Mother   . Cancer Father     splenic  . Heart disease Sister     Pacer placed  .  Cancer Brother     throat and lung  . Heart disease Son   . Stroke Neg Hx   . Colon cancer Neg Hx   . Cancer Brother     prostate, age 58  . Prostate cancer Brother     PHYSICAL EXAM: Filed Vitals:   07/17/14 2345  BP: 109/89  Pulse: 62  Temp:   Resp: 25   General:  Well appearing. No respiratory difficulty HEENT: normal Neck: supple. no JVD. Carotids 2+ bilat; no bruits. No  lymphadenopathy or thryomegaly appreciated. Cor: PMI nondisplaced. Regular rate & rhythm. No rubs, gallops or murmurs. Lungs: clear Abdomen: soft, nontender, nondistended. No hepatosplenomegaly. No bruits or masses. Good bowel sounds. Extremities: no cyanosis, clubbing, rash, edema Neuro: alert & oriented x 3, cranial nerves grossly intact. moves all 4 extremities w/o difficulty. Affect pleasant.  ECG: AF with CHB and ventricular escape at 30 bpm.  Results for orders placed or performed during the hospital encounter of 07/17/14 (from the past 24 hour(s))  Basic metabolic panel     Status: Abnormal   Collection Time: 07/17/14 10:12 PM  Result Value Ref Range   Sodium 131 (L) 135 - 145 mmol/L   Potassium 5.4 (H) 3.5 - 5.1 mmol/L   Chloride 102 96 - 112 mmol/L   CO2 19 19 - 32 mmol/L   Glucose, Bld 288 (H) 70 - 99 mg/dL   BUN 25 (H) 6 - 23 mg/dL   Creatinine, Ser 1.67 (H) 0.50 - 1.35 mg/dL   Calcium 8.9 8.4 - 10.5 mg/dL   GFR calc non Af Amer 36 (L) >90 mL/min   GFR calc Af Amer 42 (L) >90 mL/min   Anion gap 10 5 - 15  CBC with Differential/Platelet     Status: Abnormal   Collection Time: 07/17/14 10:12 PM  Result Value Ref Range   WBC 15.5 (H) 4.0 - 10.5 K/uL   RBC 5.03 4.22 - 5.81 MIL/uL   Hemoglobin 15.3 13.0 - 17.0 g/dL   HCT 44.6 39.0 - 52.0 %   MCV 88.7 78.0 - 100.0 fL   MCH 30.4 26.0 - 34.0 pg   MCHC 34.3 30.0 - 36.0 g/dL   RDW 13.3 11.5 - 15.5 %   Platelets 174 150 - 400 K/uL   Neutrophils Relative % 76 43 - 77 %   Neutro Abs 11.8 (H) 1.7 - 7.7 K/uL   Lymphocytes Relative 14 12 - 46 %   Lymphs Abs 2.2 0.7 - 4.0 K/uL   Monocytes Relative 9 3 - 12 %   Monocytes Absolute 1.4 (H) 0.1 - 1.0 K/uL   Eosinophils Relative 1 0 - 5 %   Eosinophils Absolute 0.1 0.0 - 0.7 K/uL   Basophils Relative 0 0 - 1 %   Basophils Absolute 0.1 0.0 - 0.1 K/uL  APTT     Status: None   Collection Time: 07/17/14 10:12 PM  Result Value Ref Range   aPTT 31 24 - 37 seconds  Protime-INR      Status: Abnormal   Collection Time: 07/17/14 10:12 PM  Result Value Ref Range   Prothrombin Time 16.5 (H) 11.6 - 15.2 seconds   INR 1.32 0.00 - 1.49  Troponin I     Status: Abnormal   Collection Time: 07/17/14 10:12 PM  Result Value Ref Range   Troponin I 3.21 (HH) <0.031 ng/mL  Brain natriuretic peptide     Status: Abnormal   Collection Time: 07/17/14 10:12 PM  Result Value Ref  Range   B Natriuretic Peptide 578.5 (H) 0.0 - 100.0 pg/mL  I-stat troponin, ED     Status: Abnormal   Collection Time: 07/17/14 10:14 PM  Result Value Ref Range   Troponin i, poc 2.76 (HH) 0.00 - 0.08 ng/mL   Comment NOTIFIED PHYSICIAN    Comment 3          I-stat chem 8, ed     Status: Abnormal   Collection Time: 07/17/14 10:17 PM  Result Value Ref Range   Sodium 132 (L) 135 - 145 mmol/L   Potassium 5.3 (H) 3.5 - 5.1 mmol/L   Chloride 101 96 - 112 mmol/L   BUN 30 (H) 6 - 23 mg/dL   Creatinine, Ser 1.30 0.50 - 1.35 mg/dL   Glucose, Bld 297 (H) 70 - 99 mg/dL   Calcium, Ion 1.08 (L) 1.13 - 1.30 mmol/L   TCO2 17 0 - 100 mmol/L   Hemoglobin 16.3 13.0 - 17.0 g/dL   HCT 48.0 39.0 - 52.0 %  I-Stat CG4 Lactic Acid, ED     Status: Abnormal   Collection Time: 07/17/14 10:18 PM  Result Value Ref Range   Lactic Acid, Venous 4.03 (HH) 0.5 - 2.0 mmol/L   Comment NOTIFIED PHYSICIAN   Troponin I-(serum)     Status: Abnormal   Collection Time: 07/17/14 11:36 PM  Result Value Ref Range   Troponin I 3.32 (HH) <0.031 ng/mL   Dg Chest Port 1 View  07/17/2014   CLINICAL DATA:  Acute onset of shortness of breath and decreased heart rate. Initial encounter.  EXAM: PORTABLE CHEST - 1 VIEW  COMPARISON:  Chest radiograph performed 01/02/2009  FINDINGS: The lungs are well-aerated and clear. There is no evidence of focal opacification, pleural effusion or pneumothorax.  The cardiomediastinal silhouette is borderline normal in size. No acute osseous abnormalities are seen. External pacing pads are noted.  IMPRESSION: No acute  cardiopulmonary process seen.   Electronically Signed   By: Garald Balding M.D.   On: 07/17/2014 23:00     ASSESSMENT:   81 yo man with chronic AF presenting with CHB and slow ventricular escape rhythm, cardiogenic shock, AKI, lactic acidosis and troponin elevation likely from hypoperfusion/demand-ischemia.    PLAN/DISCUSSION: Admit to CCU Discontinue carvedilol Dobutamine/dopamine infusions Prn atropine External pacing Awaiting cath lab for temporary pacemaker placement IVF Cycle troponins Hold heparin drip prior to cath lab TSH, T4 NPO Will need EP consult for permanent pacemaker implantation in AM. AM lactic acid, BMP  Hamzah Savoca 1:20 AM 07/18/2014.

## 2014-07-18 NOTE — Interval H&P Note (Deleted)
History and Physical Interval Note:  07/18/2014 1:26 AM  Dakota [name]  has presented today for surgery, with the diagnosis of Temp Pacemaker  The various methods of treatment have been discussed with the patient and family. After consideration of risks, benefits and other options for treatment, the patient has consented to  Procedure(s): TEMPORARY PACEMAKER INSERTION (N/A) as a surgical intervention .  The patient's history has been reviewed, patient examined, no change in status, stable for surgery.  I have reviewed the patient's chart and labs.  Questions were answered to the patient's satisfaction.     Collier Salina [facility] 07/18/2014 1:26 AM

## 2014-07-19 ENCOUNTER — Encounter (HOSPITAL_COMMUNITY): Payer: Self-pay | Admitting: Interventional Cardiology

## 2014-07-19 ENCOUNTER — Inpatient Hospital Stay (HOSPITAL_COMMUNITY): Payer: Medicare Other

## 2014-07-19 LAB — BASIC METABOLIC PANEL
ANION GAP: 11 (ref 5–15)
BUN: 29 mg/dL — ABNORMAL HIGH (ref 6–23)
CHLORIDE: 106 mmol/L (ref 96–112)
CO2: 18 mmol/L — ABNORMAL LOW (ref 19–32)
CREATININE: 1.39 mg/dL — AB (ref 0.50–1.35)
Calcium: 8.6 mg/dL (ref 8.4–10.5)
GFR calc non Af Amer: 45 mL/min — ABNORMAL LOW (ref >90)
GFR, EST AFRICAN AMERICAN: 52 mL/min — AB (ref >90)
GLUCOSE: 194 mg/dL — AB (ref 70–99)
Potassium: 5.1 mmol/L (ref 3.5–5.1)
SODIUM: 135 mmol/L (ref 135–145)

## 2014-07-19 MED ORDER — HALOPERIDOL LACTATE 5 MG/ML IJ SOLN
5.0000 mg | Freq: Once | INTRAMUSCULAR | Status: AC
  Administered 2014-07-19: 5 mg via INTRAVENOUS

## 2014-07-19 MED ORDER — CARVEDILOL 12.5 MG PO TABS
12.5000 mg | ORAL_TABLET | Freq: Two times a day (BID) | ORAL | Status: DC
  Administered 2014-07-19 – 2014-07-22 (×6): 12.5 mg via ORAL
  Filled 2014-07-19 (×9): qty 1

## 2014-07-19 MED ORDER — ALPRAZOLAM 0.25 MG PO TABS
0.2500 mg | ORAL_TABLET | Freq: Two times a day (BID) | ORAL | Status: DC
  Administered 2014-07-19 – 2014-07-21 (×6): 0.25 mg via ORAL
  Filled 2014-07-19 (×7): qty 1

## 2014-07-19 MED ORDER — FUROSEMIDE 10 MG/ML IJ SOLN
40.0000 mg | Freq: Two times a day (BID) | INTRAMUSCULAR | Status: DC
  Administered 2014-07-19 – 2014-07-22 (×7): 40 mg via INTRAVENOUS
  Filled 2014-07-19 (×7): qty 4

## 2014-07-19 MED ORDER — WHITE PETROLATUM GEL
Status: AC
  Administered 2014-07-19: 0.2
  Filled 2014-07-19: qty 1

## 2014-07-19 MED ORDER — HALOPERIDOL LACTATE 5 MG/ML IJ SOLN
INTRAMUSCULAR | Status: AC
  Filled 2014-07-19: qty 1

## 2014-07-19 NOTE — Progress Notes (Signed)
Received patient from 46. VSS confused with son at bedside. Patient oriented to self only. Will continue to monitor closely, all personal belongings transferred with patient

## 2014-07-19 NOTE — Progress Notes (Signed)
Pt's sitter asked RN to come to room. Pt's family and sitter were helping to feed pt tea and BBQ. Pt having difficulty eating food because he states "it gets stuck." Pt able to cough any food stuck back up. Pt and pts family agreed to not continue eating dinner. RN will pass this along to day nurses so that MD can order swallow eval. Will continue to monitor pt.

## 2014-07-19 NOTE — Progress Notes (Signed)
SUBJECTIVE: Confused overnight.  Hypoxic.  Denies CP.    [name] antiseptic oral rinse  7 mL Mouth Rinse BID  . aspirin  81 mg Oral Daily  . carvedilol  12.5 mg Oral BID WC  . furosemide  40 mg Intravenous BID      OBJECTIVE: Physical Exam: Filed Vitals:   07/19/14 0400 07/19/14 0404 07/19/14 0500 07/19/14 0600  BP: 130/65  152/63 159/126  Pulse: 59  53 62  Temp:  97.3 F (36.3 C)    TempSrc:  Oral    Resp: 25  20 23   Height:      Weight:      SpO2: 95%  95% 95%    Intake/Output Summary (Last 24 hours) at 07/19/14 0806 Last data filed at 07/19/14 0700  Gross per 24 hour  Intake    732 ml  Output   1250 ml  Net   -518 ml    Telemetry reveals afib with V pacing  GEN- The patient is confused , NAD  Head- normocephalic, atraumatic Eyes-  Sclera clear, conjunctiva pink Ears- hearing intact Oropharynx- clear Neck- supple, + JVD Lungs- bibasilar rales, tachypneic Heart- Regular rate and rhythm (paced) GI- soft, NT, ND, + BS Extremities- no clubbing, cyanosis, + edema Skin- pacemaker site is without hematoma/bleeding  LABS: Basic Metabolic Panel:  Recent Labs  07/18/14 0315 07/19/14 0250  NA 136 135  K 4.4 5.1  CL 105 106  CO2 25 18*  GLUCOSE 130* 194*  BUN 25* 29*  CREATININE 1.37* 1.39*  CALCIUM 8.6 8.6   CBC:  Recent Labs  07/17/14 2212 07/17/14 2217 07/18/14 0315  WBC 15.5*  --  13.9*  NEUTROABS 11.8*  --   --   HGB 15.3 16.3 14.3  HCT 44.6 48.0 41.9  MCV 88.7  --  87.8  PLT 174  --  161   Cardiac Enzymes:  Recent Labs  07/17/14 2336 07/18/14 0315 07/18/14 1135  TROPONINI 3.32* 3.01* 2.24*   Recent Labs  07/17/14 2336  TSH 3.775    ASSESSMENT AND PLAN:  Active Problems:   Atrial fibrillation   CHB (complete heart block)   ARF (acute renal failure)   Lactic acidosis   Cardiogenic shock   Elevated troponin level   Atrial fibrillation with slow ventricular response   NSTEMI (non-ST elevated myocardial infarction)  1.  Complete heart block Remains device dependant despite coreg washout Device interrogation is reviewed and normal CXR reveals stable lead, no ptx Routine wound care and follow-up  2. SOB Appears wet on exam and by CXR Will give IV lasix BID today and reassess tomorrow Wean O2 as able  Nurse noted aspiration with meals last night --> will consult speech path  3. NSTEMI/ CAD Cath reviewed at length with Dr Beau Fanny Will need aggressive medical therapy Restart coreg and titrate as bp allows Given allergy to statin, will need to consider lipid management strategy further prior to discharge Continue ASA Hold plavix given new PPM  4. Afib chads2vasc score is at least 4.  He has previously declined anticoagulation.  This can be reassessed as an outpatient.  Presently too ill to initiate therapy  5. 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 ARF Stable Will follow creatinine with diuresis  Transfer to TCU Hopefully to telemetry in next day or 2  Electrophysiology team  to see as needed over the weekend Please call with questions.   Thompson Grayer, MD 07/19/2014 8:06 AM

## 2014-07-19 NOTE — Progress Notes (Signed)
Pt becoming increasingly agitated. Dr Caryl Comes notified.

## 2014-07-19 NOTE — Progress Notes (Signed)
Spoke with Dr Tommi Rumps regarding pt's increasing confusion. Notified of QTc 0.48. 5mg  Haldol given IV as ordered. Sitter and son remain at bedside.

## 2014-07-20 LAB — BASIC METABOLIC PANEL
Anion gap: 4 — ABNORMAL LOW (ref 5–15)
BUN: 32 mg/dL — AB (ref 6–23)
CHLORIDE: 101 mmol/L (ref 96–112)
CO2: 31 mmol/L (ref 19–32)
Calcium: 8.7 mg/dL (ref 8.4–10.5)
Creatinine, Ser: 1.31 mg/dL (ref 0.50–1.35)
GFR calc Af Amer: 56 mL/min — ABNORMAL LOW (ref >90)
GFR, EST NON AFRICAN AMERICAN: 49 mL/min — AB (ref >90)
Glucose, Bld: 120 mg/dL — ABNORMAL HIGH (ref 70–99)
Potassium: 3.7 mmol/L (ref 3.5–5.1)
Sodium: 136 mmol/L (ref 135–145)

## 2014-07-20 NOTE — Progress Notes (Signed)
PT Cancellation Note  Patient Details Name: Dakota [name] MRN: [id number] DOB: 1928-12-17   Cancelled Treatment:    Reason Eval/Treat Not Completed: Medical issues which prohibited therapy.  Per RN, patient confused and agitated.  Is currently sleeping.  RN request we hold PT eval today.  Will return tomorrow for PT evaluation as appropriate.   Despina Pole 07/20/2014, 9:12 AM Carita Pian. Sanjuana Kava, [place] Pager [phone]

## 2014-07-20 NOTE — Progress Notes (Signed)
SUBJECTIVE: Confused overnight.  Agitated, now sleeping. Hypoxic.  Denies CP or SOB.  Wants to go home.  [name] ALPRAZolam  0.25 mg Oral BID  . antiseptic oral rinse  7 mL Mouth Rinse BID  . aspirin  81 mg Oral Daily  . carvedilol  12.5 mg Oral BID WC  . furosemide  40 mg Intravenous BID      OBJECTIVE: Physical Exam: Filed Vitals:   07/20/14 0000 07/20/14 0354 07/20/14 0400 07/20/14 0730  BP: 131/59  119/63 156/78  Pulse: 59  59 61  Temp: 97.6 F (36.4 C) 97.6 F (36.4 C)  98.8 F (37.1 C)  TempSrc: Axillary Axillary  Oral  Resp: 16  16 19   Height:      Weight:      SpO2: 100%  100% 100%    Intake/Output Summary (Last 24 hours) at 07/20/14 1113 Last data filed at 07/20/14 0900  Gross per 24 hour  Intake    440 ml  Output    875 ml  Net   -435 ml    Telemetry reveals afib with V pacing  GEN- The patient is confused , NAD  Head- normocephalic, atraumatic Eyes-  Sclera clear, conjunctiva pink Ears- hearing intact Oropharynx- clear Neck- supple, + JVD Lungs- bibasilar rales, tachypneic Heart- Regular rate and rhythm (paced) GI- soft, NT, ND, + BS Extremities- no clubbing, cyanosis, + edema Skin- pacemaker site is without hematoma/bleeding  LABS: Basic Metabolic Panel:  Recent Labs  07/19/14 0250 07/20/14 0507  NA 135 136  K 5.1 3.7  CL 106 101  CO2 18* 31  GLUCOSE 194* 120*  BUN 29* 32*  CREATININE 1.39* 1.31  CALCIUM 8.6 8.7   CBC:  Recent Labs  07/17/14 2212 07/17/14 2217 07/18/14 0315  WBC 15.5*  --  13.9*  NEUTROABS 11.8*  --   --   HGB 15.3 16.3 14.3  HCT 44.6 48.0 41.9  MCV 88.7  --  87.8  PLT 174  --  161   Cardiac Enzymes:  Recent Labs  07/17/14 2336 07/18/14 0315 07/18/14 1135  TROPONINI 3.32* 3.01* 2.24*    Recent Labs  07/17/14 2336  TSH 3.775    ASSESSMENT AND PLAN:  Active Problems:   Atrial fibrillation   CHB (complete heart block)   ARF (acute renal failure)   Lactic acidosis   Cardiogenic shock  Elevated troponin level   Atrial fibrillation with slow ventricular response   NSTEMI (non-ST elevated myocardial infarction)  1. Complete heart block Remains device dependant despite coreg washout Device interrogation is reviewed and normal CXR reveals stable lead, no ptx Routine wound care and follow-up  2. SOB Appears wet on exam and by CXR Will give IV lasix BID today and reassess tomorrow Wean O2 as able  Nurse noted aspiration with meals last night --> will consult speech path  3. NSTEMI/ CAD Cath reviewed at length with Dr Beau Fanny Will need aggressive medical therapy Restart coreg and titrate as bp allows Given allergy to statin, will need to consider lipid management strategy further prior to discharge Continue ASA Hold plavix given new PPM  4. Afib chads2vasc score is at least 4.  He has previously declined anticoagulation.  This can be reassessed as an outpatient.  Presently too ill to initiate therapy  5. 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  ARF Stable Will follow creatinine with diuresis  Transfer to telemetry, mobilize and possible discharge tomorrow.      Dorothy Spark, MD 07/20/2014 11:13 AM

## 2014-07-21 ENCOUNTER — Inpatient Hospital Stay (HOSPITAL_COMMUNITY): Payer: Medicare Other

## 2014-07-21 MED ORDER — POTASSIUM CHLORIDE CRYS ER 20 MEQ PO TBCR
20.0000 meq | EXTENDED_RELEASE_TABLET | Freq: Once | ORAL | Status: AC
  Administered 2014-07-21: 20 meq via ORAL
  Filled 2014-07-21: qty 1

## 2014-07-21 MED ORDER — FAMOTIDINE 20 MG PO TABS
20.0000 mg | ORAL_TABLET | Freq: Two times a day (BID) | ORAL | Status: DC
  Administered 2014-07-21 – 2014-07-22 (×3): 20 mg via ORAL
  Filled 2014-07-21 (×3): qty 1

## 2014-07-21 NOTE — Evaluation (Signed)
Physical Therapy Evaluation Patient Details Name: Dakota [name] MRN: [id number] DOB: Jun 29, 1928 Today's Date: 07/21/2014   History of Present Illness  Pt admit with NSTEMI with CHB with permanent pacer placed.  Afib.  AMS.    Clinical Impression  Pt admitted with above diagnosis. Pt currently with functional limitations due to the deficits listed below (see PT Problem List). Pt ambulated with RW with fair balance. Pt has assist at home by family and they are committed to taking pt home.  Feel that if HHPT is arranged, that pt can go home with family with 24 hour assist and use of RW.  Will follow acutely.   Pt will benefit from skilled PT to increase their independence and safety with mobility to allow discharge to the venue listed below.      Follow Up Recommendations Home health PT;Supervision/Assistance - 24 hour    Equipment Recommendations  None recommended by PT    Recommendations for Other Services       Precautions / Restrictions Precautions Precautions: Fall Restrictions Weight Bearing Restrictions: No      Mobility  Bed Mobility Overal bed mobility: Needs Assistance Bed Mobility: Supine to Sit     Supine to sit: Min assist     General bed mobility comments: Pt reached for PT to pull up with right arm.  Assisted pt by allowing him to pull up.     Transfers Overall transfer level: Needs assistance Equipment used: Rolling walker (2 wheeled) Transfers: Sit to/from Stand Sit to Stand: Min assist         General transfer comment: Pt needed cues for hand placement as well as steadying assist once on feet.    Ambulation/Gait Ambulation/Gait assistance: Min assist;Mod assist Ambulation Distance (Feet): 250 Feet Assistive device: Rolling walker (2 wheeled) Gait Pattern/deviations: Step-through pattern;Decreased stride length;Shuffle;Drifts right/left;Staggering right;Staggering left;Narrow base of support;Scissoring   Gait velocity interpretation: Below normal  speed for age/gender General Gait Details: Pt ambulating with RW with need for cues and assist with poor balance at times.  Daughter in law present and aware of pts' deficits and aware that pt needs 24 hour care and that pt needs someone providing min guard assist for steadying pt as well as to cue pt to stay close to RW and to stand tall.  Daughter in law instructed in how to cue pt for step sequence and RW technique.    Stairs            Wheelchair Mobility    Modified Rankin (Stroke Patients Only)       Balance Overall balance assessment: Needs assistance;History of Falls Sitting-balance support: No upper extremity supported;Feet supported Sitting balance-Leahy Scale: Fair Sitting balance - Comments: Can sit without UE support   Standing balance support: Bilateral upper extremity supported;During functional activity Standing balance-Leahy Scale: Poor Standing balance comment: Requires UE support for balance in static stance.               High level balance activites: Direction changes;Turns;Sudden stops High Level Balance Comments: Needs min assist for challenges to balance with RW and suppport of PT. At risk of fall without RW and assist of one person at present.               Pertinent Vitals/Pain Pain Assessment: 0-10 Pain Score: 2  Pain Location: left upper chest Pain Descriptors / Indicators: Aching Pain Intervention(s): Limited activity within patient's tolerance;Monitored during session;Premedicated before session;Repositioned  VSS    Home Living Family/patient expects to  be discharged to:: Private residence Living Arrangements: Spouse/significant other;Children Available Help at Discharge: Family;Available 24 hours/day Type of Home: House Home Access: Stairs to enter Entrance Stairs-Rails: None Entrance Stairs-Number of Steps: 3 Home Layout: One level Home Equipment: Walker - 4 wheels;Walker - 2 wheels;Bedside commode;Shower seat;Tub bench;Grab  bars - tub/shower;Hand held shower head;Wheelchair - manual Additional Comments: wife uses wheelchair, family helps wife bathe and dress    Prior Function Level of Independence: Independent               Hand Dominance   Dominant Hand: Right    Extremity/Trunk Assessment   Upper Extremity Assessment: Defer to OT evaluation           Lower Extremity Assessment: Generalized weakness      Cervical / Trunk Assessment: Kyphotic  Communication   Communication: No difficulties  Cognition Arousal/Alertness: Awake/alert Behavior During Therapy: Impulsive;Anxious Overall Cognitive Status: Impaired/Different from baseline Area of Impairment: Following commands;Safety/judgement;Awareness       Following Commands: Follows one step commands with increased time Safety/Judgement: Decreased awareness of deficits;Decreased awareness of safety Awareness: Intellectual        General Comments      Exercises        Assessment/Plan    PT Assessment Patient needs continued PT services  PT Diagnosis Generalized weakness   PT Problem List Decreased balance;Decreased activity tolerance;Decreased mobility;Decreased knowledge of use of DME;Decreased safety awareness;Decreased knowledge of precautions  PT Treatment Interventions DME instruction;Gait training;Stair training;Functional mobility training;Therapeutic activities;Therapeutic exercise;Balance training;Patient/family education   PT Goals (Current goals can be found in the Care Plan section) Acute Rehab PT Goals Patient Stated Goal: to get better PT Goal Formulation: With patient Time For Goal Achievement: 07/28/14 Potential to Achieve Goals: Good    Frequency Min 3X/week   Barriers to discharge        Co-evaluation               End of Session Equipment Utilized During Treatment: Gait belt Activity Tolerance: Patient limited by fatigue Patient left: in chair;with call bell/phone within reach;with chair  alarm set;with family/visitor present Nurse Communication: Mobility status         Time: 0300-9233 PT Time Calculation (min) (ACUTE ONLY): 38 min   Charges:   PT Evaluation $Initial PT Evaluation Tier I: 1 Procedure PT Treatments $Gait Training: 8-22 mins $Self Care/Home Management: 8-22   PT G CodesDenice Paradise Aug 15, 2014, 4:04 PM [place] Georgeana Oertel,PT Acute Rehabilitation [phone] (pager)

## 2014-07-21 NOTE — Progress Notes (Signed)
Patient has had severe difficulty swallowing foods/pills this morning. Strong cough and vomiting of undigested food noted. MD aware. Swallow evaluation requested. Patient experienced one episode of bloody emesis. Diluted blood in urine was observed in foley catheter collection bag. MD notified. Will continue to monitor.

## 2014-07-21 NOTE — Progress Notes (Signed)
SUBJECTIVE: Less confused, SOB is better.  Cough is worse and he now has some scant hemoptysis  . ALPRAZolam  0.25 mg Oral BID  . antiseptic oral rinse  7 mL Mouth Rinse BID  . aspirin  81 mg Oral Daily  . carvedilol  12.5 mg Oral BID WC  . famotidine  20 mg Oral BID  . furosemide  40 mg Intravenous BID  . potassium chloride  20 mEq Oral Once      OBJECTIVE: Physical Exam: Filed Vitals:   07/20/14 1807 07/20/14 2100 07/21/14 0500 07/21/14 0638  BP: 113/59 121/59 117/65   Pulse: 59 62 59 58  Temp: 97.9 F (36.6 C) 99 F (37.2 C) 98.5 F (36.9 C)   TempSrc: Oral     Resp:  22 23 22   Height:      Weight:   188 lb (85.276 kg)   SpO2: 98% 100% 97% 100%    Intake/Output Summary (Last 24 hours) at 07/21/14 0846 Last data filed at 07/21/14 0500  Gross per 24 hour  Intake    200 ml  Output   2350 ml  Net  -2150 ml    Telemetry reveals afib with V pacing  GEN- The patient is confused , NAD  Head- normocephalic, atraumatic Eyes-  Sclera clear, conjunctiva pink Ears- hearing intact Oropharynx- clear Neck- supple, + JVD Lungs- normal WOB, coarse BS Heart- Regular rate and rhythm (paced) GI- soft, NT, ND, + BS Extremities- no clubbing, cyanosis, + edema Skin- pacemaker site is without hematoma/bleeding  LABS: Basic Metabolic Panel:  Recent Labs  07/19/14 0250 07/20/14 0507  NA 135 136  K 5.1 3.7  CL 106 101  CO2 18* 31  GLUCOSE 194* 120*  BUN 29* 32*  CREATININE 1.39* 1.31  CALCIUM 8.6 8.7   CBC: No results for input(s): WBC, NEUTROABS, HGB, HCT, MCV, PLT in the last 72 hours. Cardiac Enzymes:  Recent Labs  07/18/14 1135  TROPONINI 2.24*  No results for input(s): TSH, T4TOTAL, T3FREE, THYROIDAB in the last 72 hours.  Invalid input(s): FREET3  ASSESSMENT AND PLAN:  Active Problems:   Atrial fibrillation   CHB (complete heart block)   ARF (acute renal failure)   Lactic acidosis   Cardiogenic shock   Elevated troponin level   Atrial  fibrillation with slow ventricular response   NSTEMI (non-ST elevated myocardial infarction)  1. Complete heart block paced  2. SOB I suspect that this is multifactoral.  He has clear volume overload (acute diastolic dysfunction) which has improved with diuresis.  He has likely aspirated and now has cough with hemoptysis. Repeat CXR.   Speech path consult is pending If not improved, may need to consult pulmonary team  3. NSTEMI/ CAD Cath reviewed at length with Dr Beau Fanny Will need aggressive medical therapy Tolerating ASA and coreg Given allergy to statin, will need to consider lipid management strategy.  Given acute medical illness, confusion, clinical decline, etc would defer this decision to outpatient status Hold plavix given hemoptysis  4. Afib chads2vasc score is at least 4.  He has previously declined anticoagulation.  This can be reassessed as an outpatient.  Presently too ill to initiate therapy  5. 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 ARF Stable Will follow creatinine with diuresis  Unfortunately, I do not think that his family realizes that he was near death and remains very ill.  I  worry that their expectations of recovery may be a little to optimistic. He may require SNF at discharge.  I will ask PT to see today.  The family is a little frustrated that PT did not see yesterday.   Thompson Grayer, MD 07/21/2014 8:46 AM

## 2014-07-21 NOTE — Evaluation (Signed)
Clinical/Bedside Swallow Evaluation Patient Details  Name: Dakota [name] MRN: [id number] Date of Birth: Jan 17, 1929  Today's Date: 07/21/2014 Time: SLP Start Time (ACUTE ONLY): 1526 SLP Stop Time (ACUTE ONLY): 1547 SLP Time Calculation (min) (ACUTE ONLY): 21 min  Past Medical History:  Past Medical History  Diagnosis Date  . GERD (gastroesophageal reflux disease) 10/2002  . Hyperlipidemia 12/1994  . Hypertension   . Chronic atrial fibrillation     not on coumadin therapy  . Back pain, chronic   . Lung collapse 03/1991    Fall   Past Surgical History:  Past Surgical History  Procedure Laterality Date  . Back surgery  11/91    with hardware fixation  . Cardiac catheterization  06/2004    30 % stenosis, EF normal  . Eye surgery      Retinal bubble surgery, cataract   . Temporary pacemaker insertion N/A 07/18/2014    Procedure: TEMPORARY PACEMAKER INSERTION;  Surgeon: Peter M Martinique, MD;  Location: [facility] CATH LAB;  Service: Cardiovascular;  Laterality: N/A;  . Left heart catheterization with coronary angiogram N/A 07/18/2014    Procedure: LEFT HEART CATHETERIZATION WITH CORONARY ANGIOGRAM;  Surgeon: Jettie Booze, MD;  Location: [facility] CATH LAB;  Service: Cardiovascular;  Laterality: N/A;  . Permanent pacemaker insertion N/A 07/18/2014    Procedure: PERMANENT PACEMAKER INSERTION;  Surgeon: Thompson Grayer, MD;  Location: [facility] CATH LAB;  Service: Cardiovascular;  Laterality: N/A;   HPI:  81 year old male admitted with SOB, chest discomfort, noted to be bradycardic. Labs showed lactic acidosis and AKI. Upon arrival to CCU, went unreponsive and lost pulse briefly. S/p pacemaker placement 3/18. S/p procedure, c/o globus with pos. 3/21 complaining of both globus and regurgitation noted of swallowed pos. Per son, patient with similar symptoms 1 year ago. Was seen by MD and placed on Zantac with dramatic improvements. Not on Zantac this admission prior to episode.    Assessment / Plan /  Recommendation Clinical Impression  Patient presents with a primary esophageal dysphagia characterized by c/o globus, regurgitation with pos without any evidence of oropharyngeal difficulty or s/s of aspiration. SOB and regurgitation episodes do put patient at risk for aspiration. Based on conversation with son, simliar episode occurring 1 year ago, now controlled with Zantac which patient was not on after admission to hospital, likely contributing to worsening of symptoms. Defer further management of GERD like symptoms to MD. SLP will f/u briefly for f/u education regarding compensatory strategies prior to d/c.     Aspiration Risk  Moderate    Diet Recommendation Dysphagia 3 (Mechanical Soft);Thin liquid (dysphagia 3 solid to assist in esophageal transit)   Liquid Administration via: Cup;Straw Medication Administration: Whole meds with liquid Supervision: Patient able to self feed;Intermittent supervision to cue for compensatory strategies Compensations: Slow rate;Small sips/bites;Follow solids with liquid Postural Changes and/or Swallow Maneuvers: Seated upright 90 degrees;Upright 30-60 min after meal    Other  Recommendations Oral Care Recommendations: Oral care BID   Follow Up Recommendations  None    Frequency and Duration min 1 x/week  1 week   Pertinent Vitals/Pain N/a        Swallow Study Prior Functional Status  Type of Home: House Available Help at Discharge: Family;Available 24 hours/day    General HPI: 81 year old male admitted with SOB, chest discomfort, noted to be bradycardic. Labs showed lactic acidosis and AKI. Upon arrival to CCU, went unreponsive and lost pulse briefly. S/p pacemaker placement 3/18. S/p procedure, c/o globus with  pos. 3/21 complaining of both globus and regurgitation noted of swallowed pos. Per son, patient with similar symptoms 1 year ago. Was seen by MD and placed on Zantac with dramatic improvements. Not on Zantac this admission prior to  episode.  Type of Study: Bedside swallow evaluation Previous Swallow Assessment: none Diet Prior to this Study: Regular;Thin liquids Temperature Spikes Noted: No Respiratory Status: Room air History of Recent Intubation: No Behavior/Cognition: Alert;Cooperative;Pleasant mood Oral Cavity - Dentition: Dentures, top;Dentures, bottom Self-Feeding Abilities: Able to feed self Patient Positioning: Upright in chair Baseline Vocal Quality: Low vocal intensity;Hoarse (SOB impacting intelligibility) Volitional Cough: Strong Volitional Swallow: Able to elicit    Oral/Motor/Sensory Function Overall Oral Motor/Sensory Function: Appears within functional limits for tasks assessed   Ice Chips Ice chips: Not tested   Thin Liquid Thin Liquid: Within functional limits Presentation: Cup;Self Fed;Straw    Nectar Thick Nectar Thick Liquid: Not tested   Honey Thick Honey Thick Liquid: Not tested   Puree Puree: Within functional limits Presentation: Self Fed;Spoon   Solid   GO   Dakota Jedlicka MA, CCC-SLP [phone]  Solid: Within functional limits Presentation: [place] 07/21/2014,4:18 PM

## 2014-07-22 LAB — BASIC METABOLIC PANEL
ANION GAP: 12 (ref 5–15)
BUN: 30 mg/dL — ABNORMAL HIGH (ref 6–23)
CALCIUM: 9.2 mg/dL (ref 8.4–10.5)
CO2: 32 mmol/L (ref 19–32)
Chloride: 96 mmol/L (ref 96–112)
Creatinine, Ser: 1.29 mg/dL (ref 0.50–1.35)
GFR calc Af Amer: 57 mL/min — ABNORMAL LOW (ref >90)
GFR, EST NON AFRICAN AMERICAN: 50 mL/min — AB (ref >90)
Glucose, Bld: 105 mg/dL — ABNORMAL HIGH (ref 70–99)
Potassium: 3.3 mmol/L — ABNORMAL LOW (ref 3.5–5.1)
Sodium: 140 mmol/L (ref 135–145)

## 2014-07-22 MED ORDER — HALOPERIDOL LACTATE 5 MG/ML IJ SOLN
5.0000 mg | Freq: Four times a day (QID) | INTRAMUSCULAR | Status: DC | PRN
  Administered 2014-07-22: 5 mg via INTRAVENOUS
  Filled 2014-07-22: qty 1

## 2014-07-22 MED ORDER — POTASSIUM CHLORIDE CRYS ER 20 MEQ PO TBCR: 40.0000 meq | EXTENDED_RELEASE_TABLET | Freq: Once | ORAL | Status: DC

## 2014-07-22 MED ORDER — LORAZEPAM 2 MG/ML IJ SOLN
0.5000 mg | Freq: Once | INTRAMUSCULAR | Status: AC
  Administered 2014-07-22: 0.5 mg via INTRAVENOUS
  Filled 2014-07-22: qty 1

## 2014-07-22 MED ORDER — HALOPERIDOL LACTATE 5 MG/ML IJ SOLN
5.0000 mg | Freq: Once | INTRAMUSCULAR | Status: AC
  Administered 2014-07-22: 5 mg via INTRAVENOUS
  Filled 2014-07-22: qty 1

## 2014-07-22 MED ORDER — CLOPIDOGREL BISULFATE 75 MG PO TABS: 75.0000 mg | ORAL_TABLET | Freq: Every day | ORAL | Status: DC

## 2014-07-22 NOTE — Discharge Summary (Signed)
ELECTROPHYSIOLOGY PROCEDURE DISCHARGE SUMMARY    Patient ID: Dakota [name],  MRN: [id number], DOB/AGE: 02-Aug-1928 81 y.o.  Admit date: 07/17/2014 Discharge date: 07/22/2014  Primary Care Physician: [name], MD Primary Cardiologist: Johnsie Cancel Electrophysiologist: Johneisha Broaden  Primary Discharge Diagnosis:  Symptomatic complete heart block status post pacemaker implantation this admission NSTEMI Acute diastolic dysfunction Altered mental status  Secondary Discharge Diagnosis:  1.  Permanent atrial fibrillation 2.  CAD - occluded left PDA per cath this admission 3.  GERD 4.  Hyperlipidemia 5.  Hypertensioin  Allergies  Allergen Reactions  . Atorvastatin     REACTION: MUSCLE PAIN  . Diazepam     REACTION: UNSPECIFIED  . Ezetimibe     REACTION: MUSCLE PAIN  . Penicillins     REACTION: UNSPECIFIED     Procedures This Admission:  1.   Implantation of a temporary transvenous pacemaker on 07-18-14 by Dr Martinique 2.  Cardiac catheterization on 07-18-14 by Dr Beau Fanny which demonstrated normal left main, mild to moderate disease in LAD, patent left circ, occluded PDA, non dominant RCA. 3.  Implantation of a MDT dual chamber PPM on 07-18-14 by Dr Rayann Heman.  The patient received a MDT model number Sensia PPM with model number 5076 right atrial lead and 5076 right ventricular lead. There were no immediate post procedure complications.  Temporary pacing wire was removed at that time 2.  CXR on 07-19-14 demonstrated no pneumothorax status post device implantation.   Brief HPI/Hospital Course:  Dakota [name] is a 81 y.o. male with a past medical history as outlined above. He presented to the ER 07-17-14 with fatigue and syncope and was found to be in CHB.  He underwent temporary transvenous pacemaker insertion with plans for beta blocker washout and reassessment of rhythm.  Unfortunately, his temp wire lost capture and he was taken urgently to the cath lab while transcutaenously pacing for  cardiac catheterization and insertion of new temporary wire.  Cath demonstrated occluded PDA and it was felt that BB would be necessary long term for management of CAD.  He underwent urgent implantation of a dual chamber pacemaker with details as outlined above.  Post op course was complicated by shortness of breath and altered mental status.  He was diuresed and improved.  PT evaluated and felt he could benefit from Inez.  Speech evaluated for possible aspiration and felt dysphagia III diet with thin liquids was appropriate.  He  was monitored on telemetry which demonstrated atrial fibrillation with ventricular pacing.  Left chest was without hematoma or ecchymosis.  The device was interrogated and found to be functioning normally.  CXR was obtained and demonstrated no pneumothorax status post device implantation.  Wound care, arm mobility, and restrictions were reviewed with the patient.  The patient was examined and considered stable for discharge to home.   The patient has previously declined anticoagulation and this will need to be readressed as an outpatient.  He is also noted to be intolerant of statins and with CAD/NSTEMI this admissino will need aggressive medical management for CAD.  Plavix to be started in 5 days to allow PPM pocket time to heal.   He will be seen in the office in 1 week for TOC appt and follow up with Dr Johnsie Cancel as scheduled.  He was hypokalemic on day of discharge and potassium was supplemented.    Physical Exam: Filed Vitals:   07/21/14 1500 07/21/14 2040 07/22/14 0500 07/22/14 1451  BP: 111/97 114/57 109/62 108/53  Pulse:  62 61 61 62  Temp: 97.5 F (36.4 C) 98.4 F (36.9 C) 98.2 F (36.8 C) 98.3 F (36.8 C)  TempSrc: Oral Oral  Oral  Resp: 16 18 18 18   Height:      Weight:   188 lb 6.4 oz (85.458 kg)   SpO2: 100% 96% 99% 96%    Labs:   Lab Results  Component Value Date   WBC 13.9* 07/18/2014   HGB 14.3 07/18/2014   HCT 41.9 07/18/2014   MCV 87.8  07/18/2014   PLT 161 07/18/2014     Recent Labs Lab 07/22/14 0947  NA 140  K 3.3*  CL 96  CO2 32  BUN 30*  CREATININE 1.29  CALCIUM 9.2  GLUCOSE 105*    Discharge Medications:    Medication List    TAKE these medications        ALPRAZolam 0.5 MG tablet  Commonly known as:  XANAX  TAKE ONE-HALF TABLET BY MOUTH TWICE DAILY AS NEEDED FOR ANXIETY     aspirin 81 MG EC tablet  Take 81 mg by mouth daily.     calcium carbonate 500 MG chewable tablet  Commonly known as:  TUMS - dosed in mg elemental calcium  Chew 1 tablet by mouth daily as needed for indigestion.     carvedilol 25 MG tablet  Commonly known as:  COREG  Take 1 tablet (25 mg total) by mouth 2 (two) times daily with a meal.     clopidogrel 75 MG tablet  Commonly known as:  PLAVIX  Take 1 tablet (75 mg total) by mouth daily.     ranitidine 150 MG tablet  Commonly known as:  ZANTAC  Take 150 mg by mouth daily.        Disposition:  Discharge Instructions    Diet - low sodium heart healthy    Complete by:  As directed      Discharge instructions    Complete by:  As directed   Weigh yourself daily.  Call the office for a weight gain of more than 2 pounds in 1 day or more than 5 pounds in 1 week.     Increase activity slowly    Complete by:  As directed           Follow-up Information    Follow up with Patsey Berthold, NP On 08/04/2014.   Specialty:  Nurse Practitioner   Why:  at 8:30AM   Contact information:   [place] [address] [phone]       Follow up with Scott.   Why:  Physical Therapy   Contact information:   [address] [place] [address] [phone]       Follow up with Thompson Grayer, MD On 10/22/2014.   Specialty:  Cardiology   Why:  at 10:45AM   Contact information:   [place] [address] [phone]       Duration of Discharge Encounter: Greater than 30 minutes including physician  time.  Signed, Chanetta Marshall, NP 07/22/2014 3:02 PM   Thompson Grayer MD

## 2014-07-22 NOTE — Progress Notes (Signed)
PT Cancellation Note  Patient Details Name: Dakota [name] MRN: [id number] DOB: 1929/01/15   Cancelled Treatment:    Reason Eval/Treat Not Completed: Other (comment) (Family refused as pt had bad night.  They feel comfortable with pts mobility.  They have equipment at home.  HH is being arranged.)   Irwin Brakeman F 07/22/2014, 10:13 AM [facility] [phone] (pager)

## 2014-07-22 NOTE — Progress Notes (Addendum)
SUBJECTIVE: Less confused, SOB is better.  Cough is improved this morning.  Son thinks confusion is better. They are anxious for discharge.   CURRENT MEDICATIONS: . ALPRAZolam  0.25 mg Oral BID  . antiseptic oral rinse  7 mL Mouth Rinse BID  . aspirin  81 mg Oral Daily  . carvedilol  12.5 mg Oral BID WC  . famotidine  20 mg Oral BID  . furosemide  40 mg Intravenous BID      OBJECTIVE: Physical Exam: Filed Vitals:   07/21/14 0638 07/21/14 1500 07/21/14 2040 07/22/14 0500  BP:  111/97 114/57 109/62  Pulse: 58 62 61 61  Temp:  97.5 F (36.4 C) 98.4 F (36.9 C) 98.2 F (36.8 C)  TempSrc:  Oral Oral   Resp: 22 16 18 18   Height:      Weight:    188 lb 6.4 oz (85.458 kg)  SpO2: 100% 100% 96% 99%    Intake/Output Summary (Last 24 hours) at 07/22/14 3875 Last data filed at 07/22/14 0020  Gross per 24 hour  Intake      0 ml  Output   2150 ml  Net  -2150 ml    Telemetry reveals afib with V pacing  GEN- The patient is confused , NAD  Head- normocephalic, atraumatic Eyes-  Sclera clear, conjunctiva pink Ears- hearing intact Oropharynx- clear Neck- supple, + JVD Lungs- normal WOB, coarse BS Heart- Regular rate and rhythm (paced) GI- soft, NT, ND, + BS Extremities- no clubbing, cyanosis, + edema Skin- pacemaker site is without hematoma/bleeding  LABS: Basic Metabolic Panel:  Recent Labs  07/20/14 0507  NA 136  K 3.7  CL 101  CO2 31  GLUCOSE 120*  BUN 32*  CREATININE 1.31  CALCIUM 8.7   ASSESSMENT AND PLAN:  Active Problems:   Atrial fibrillation   CHB (complete heart block)   ARF (acute renal failure)   Lactic acidosis   Cardiogenic shock   Elevated troponin level   Atrial fibrillation with slow ventricular response   NSTEMI (non-ST elevated myocardial infarction)  1. Complete heart block Paced PPM pocket well healed  2. SOB I suspect that this is multifactoral.  Volume overload (acute diastolic dysfunction) has improved with diuresis (-2L  yesterday).   CXR yesterday demonstrated streaky left basilar atelectasis or infiltrate; question small left pleural effusion Speech path consult done with dysphagia 3 thin liquid diet recommended.  3. NSTEMI/ CAD Cath reviewed at length with Dr Beau Fanny Will need aggressive medical therapy Tolerating ASA and coreg Given allergy to statin, will need to consider lipid management strategy.  Given acute medical illness, confusion, clinical decline, etc would defer this decision to outpatient status Hold plavix given hemoptysis  4. Afib chads2vasc score is at least 4.  He has previously declined anticoagulation.  This can be reassessed as an outpatient.  Presently too ill to initiate therapy  5. AMS/ confusion Likely due to sun downing, possibly with some anoxic encephalopathy due to asystolic periods prior to PPM implant Improving   6. ARF Check BMET this morning.   Ambulated with PT yesterday who recommended HHPT.  Son has arranged care during the day for patient and family is planning on helping at night.    Will wean O2 to off this morning, ambulate again. Hopefully home this afternoon or tomorrow. Will need TOC visit in 7-10 days.   Chanetta Marshall, NP 07/22/2014 9:28 AM    I have seen, examined the patient, and reviewed the above assessment  and plan.  On exam, he is a little confused.  Sleeping but rouses.  Lung are clear today.  Changes to above are made where necessary.   Family is very clear that they would like for him to go home.  Will review BMET once available and then likely discharge to home. Would benefit from close follow-up with primary care.  Return to see EP NP for wound check in 1 week.  Co Sign: Thompson Grayer, MD 07/22/2014 10:35 AM

## 2014-07-22 NOTE — Progress Notes (Signed)
Speech Language Pathology Treatment: Dysphagia  Patient Details Name: Dakota [name] MRN: [id number] DOB: [birth year] Today's Date: 07/22/2014 Time: 1010-1020 SLP Time Calculation (min) (ACUTE ONLY): 10 min  Assessment / Plan / Recommendation Clinical Impression  F/u after yesterday's swallow evaluation.  Pt with improved esophageal symptoms per son.  Reviewed esophageal precautions with pt/sons; observed pt consuming liquids - no c/o globus today.  No further SLP f/u needed - education completed. Will sign off.    HPI HPI: 81 year old male admitted with SOB, chest discomfort, noted to be bradycardic. Labs showed lactic acidosis and AKI. Upon arrival to CCU, went unreponsive and lost pulse briefly. S/p pacemaker placement 3/18. S/p procedure, c/o globus with pos. 3/21 complaining of both globus and regurgitation noted of swallowed pos. Per son, patient with similar symptoms 1 year ago. Was seen by MD and placed on Zantac with dramatic improvements. Not on Zantac this admission prior to episode.    Pertinent Vitals    SLP Plan  All goals met    Recommendations Diet recommendations: Dysphagia 3 (mechanical soft);Thin liquid Liquids provided via: Cup;Straw Medication Administration: Whole meds with liquid Supervision: Patient able to self feed;Intermittent supervision to cue for compensatory strategies Compensations: Slow rate;Small sips/bites;Follow solids with liquid Postural Changes and/or Swallow Maneuvers: Seated upright 90 degrees;Upright 30-60 min after meal              Oral Care Recommendations: Oral care BID Follow up Recommendations: None Plan: All goals met   Jeralynn Vaquera L. Tivis Ringer, [place] CCC/SLP Pager [phone]      Juan Quam Laurice 07/22/2014, 10:24 AM

## 2014-07-23 DIAGNOSIS — Z87891 Personal history of nicotine dependence: Secondary | ICD-10-CM | POA: Diagnosis not present

## 2014-07-23 DIAGNOSIS — I10 Essential (primary) hypertension: Secondary | ICD-10-CM | POA: Diagnosis not present

## 2014-07-23 DIAGNOSIS — I2510 Atherosclerotic heart disease of native coronary artery without angina pectoris: Secondary | ICD-10-CM | POA: Diagnosis not present

## 2014-07-23 DIAGNOSIS — M545 Low back pain: Secondary | ICD-10-CM | POA: Diagnosis not present

## 2014-07-23 DIAGNOSIS — F17220 Nicotine dependence, chewing tobacco, uncomplicated: Secondary | ICD-10-CM | POA: Diagnosis not present

## 2014-07-23 DIAGNOSIS — Z950 Presence of cardiac pacemaker: Secondary | ICD-10-CM | POA: Diagnosis not present

## 2014-07-23 DIAGNOSIS — I4891 Unspecified atrial fibrillation: Secondary | ICD-10-CM | POA: Diagnosis not present

## 2014-07-23 DIAGNOSIS — K219 Gastro-esophageal reflux disease without esophagitis: Secondary | ICD-10-CM | POA: Diagnosis not present

## 2014-07-23 DIAGNOSIS — E785 Hyperlipidemia, unspecified: Secondary | ICD-10-CM | POA: Diagnosis not present

## 2014-07-23 DIAGNOSIS — I214 Non-ST elevation (NSTEMI) myocardial infarction: Secondary | ICD-10-CM | POA: Diagnosis not present

## 2014-07-23 DIAGNOSIS — Z48812 Encounter for surgical aftercare following surgery on the circulatory system: Secondary | ICD-10-CM | POA: Diagnosis not present

## 2014-07-28 DIAGNOSIS — I214 Non-ST elevation (NSTEMI) myocardial infarction: Secondary | ICD-10-CM | POA: Diagnosis not present

## 2014-07-28 DIAGNOSIS — E785 Hyperlipidemia, unspecified: Secondary | ICD-10-CM | POA: Diagnosis not present

## 2014-07-28 DIAGNOSIS — I2510 Atherosclerotic heart disease of native coronary artery without angina pectoris: Secondary | ICD-10-CM | POA: Diagnosis not present

## 2014-07-28 DIAGNOSIS — Z48812 Encounter for surgical aftercare following surgery on the circulatory system: Secondary | ICD-10-CM | POA: Diagnosis not present

## 2014-07-28 DIAGNOSIS — I4891 Unspecified atrial fibrillation: Secondary | ICD-10-CM | POA: Diagnosis not present

## 2014-07-28 DIAGNOSIS — I10 Essential (primary) hypertension: Secondary | ICD-10-CM | POA: Diagnosis not present

## 2014-08-01 DIAGNOSIS — Z48812 Encounter for surgical aftercare following surgery on the circulatory system: Secondary | ICD-10-CM | POA: Diagnosis not present

## 2014-08-01 DIAGNOSIS — I214 Non-ST elevation (NSTEMI) myocardial infarction: Secondary | ICD-10-CM | POA: Diagnosis not present

## 2014-08-01 DIAGNOSIS — E785 Hyperlipidemia, unspecified: Secondary | ICD-10-CM | POA: Diagnosis not present

## 2014-08-01 DIAGNOSIS — I4891 Unspecified atrial fibrillation: Secondary | ICD-10-CM | POA: Diagnosis not present

## 2014-08-01 DIAGNOSIS — I10 Essential (primary) hypertension: Secondary | ICD-10-CM | POA: Diagnosis not present

## 2014-08-01 DIAGNOSIS — I2510 Atherosclerotic heart disease of native coronary artery without angina pectoris: Secondary | ICD-10-CM | POA: Diagnosis not present

## 2014-08-04 ENCOUNTER — Ambulatory Visit (INDEPENDENT_AMBULATORY_CARE_PROVIDER_SITE_OTHER): Payer: Medicare Other | Admitting: *Deleted

## 2014-08-04 DIAGNOSIS — I482 Chronic atrial fibrillation, unspecified: Secondary | ICD-10-CM

## 2014-08-04 LAB — MDC_IDC_ENUM_SESS_TYPE_INCLINIC
Battery Impedance: 100 Ohm
Battery Remaining Longevity: 103 mo
Date Time Interrogation Session: 20160404135252
Lead Channel Impedance Value: 0 Ohm
Lead Channel Impedance Value: 564 Ohm
Lead Channel Pacing Threshold Amplitude: 0.75 V
Lead Channel Pacing Threshold Pulse Width: 0.4 ms
Lead Channel Setting Sensing Sensitivity: 4 mV
MDC IDC MSMT BATTERY VOLTAGE: 2.77 V
MDC IDC MSMT LEADCHNL RV SENSING INTR AMPL: 5.6 mV
MDC IDC SET LEADCHNL RV PACING AMPLITUDE: 3.5 V
MDC IDC SET LEADCHNL RV PACING PULSEWIDTH: 0.4 ms
MDC IDC STAT BRADY RV PERCENT PACED: 92 %

## 2014-08-04 NOTE — Progress Notes (Signed)
Wound check appointment. Steri-strips removed. Wound without redness or edema. Incision edges approximated, wound well healed. Normal device function. Thresholds, sensing, and impedances consistent with implant measurements. Device programmed at 3.5V/auto capture programmed on for extra safety margin until 3 month visit--changed pulse width from 1.00 to 0.40 due threshold testing. Rv output set at 3.5 @ 0.40. Histogram distribution appropriate for patient and level of activity. No high ventricular rates noted. Patient educated about wound care, arm mobility, lifting restrictions. ROV in 3 months with JA.

## 2014-08-08 DIAGNOSIS — E785 Hyperlipidemia, unspecified: Secondary | ICD-10-CM | POA: Diagnosis not present

## 2014-08-08 DIAGNOSIS — Z48812 Encounter for surgical aftercare following surgery on the circulatory system: Secondary | ICD-10-CM | POA: Diagnosis not present

## 2014-08-08 DIAGNOSIS — I4891 Unspecified atrial fibrillation: Secondary | ICD-10-CM | POA: Diagnosis not present

## 2014-08-08 DIAGNOSIS — I10 Essential (primary) hypertension: Secondary | ICD-10-CM | POA: Diagnosis not present

## 2014-08-08 DIAGNOSIS — I2510 Atherosclerotic heart disease of native coronary artery without angina pectoris: Secondary | ICD-10-CM | POA: Diagnosis not present

## 2014-08-08 DIAGNOSIS — I214 Non-ST elevation (NSTEMI) myocardial infarction: Secondary | ICD-10-CM | POA: Diagnosis not present

## 2014-08-12 ENCOUNTER — Telehealth: Payer: Self-pay | Admitting: Internal Medicine

## 2014-08-12 ENCOUNTER — Encounter: Payer: Self-pay | Admitting: Internal Medicine

## 2014-08-12 DIAGNOSIS — I10 Essential (primary) hypertension: Secondary | ICD-10-CM | POA: Diagnosis not present

## 2014-08-12 DIAGNOSIS — E785 Hyperlipidemia, unspecified: Secondary | ICD-10-CM | POA: Diagnosis not present

## 2014-08-12 DIAGNOSIS — I4891 Unspecified atrial fibrillation: Secondary | ICD-10-CM | POA: Diagnosis not present

## 2014-08-12 DIAGNOSIS — Z48812 Encounter for surgical aftercare following surgery on the circulatory system: Secondary | ICD-10-CM | POA: Diagnosis not present

## 2014-08-12 DIAGNOSIS — I2510 Atherosclerotic heart disease of native coronary artery without angina pectoris: Secondary | ICD-10-CM | POA: Diagnosis not present

## 2014-08-12 DIAGNOSIS — I214 Non-ST elevation (NSTEMI) myocardial infarction: Secondary | ICD-10-CM | POA: Diagnosis not present

## 2014-08-12 NOTE — Telephone Encounter (Signed)
New message      Pt c/o medication issue:  1. Name of Medication: plavix  2. How are you currently taking this medication (dosage and times per day)? 75mg  daily 3. Are you having a reaction (difficulty breathing--STAT)? no  4. What is your medication issue? Pt started plavix on 07-20-14. Pt legs are "tingling" from the knees down. bp sun was 107/68.  Legs don't tingle all of the time---sometimes they feel numb.  Could this be from the plavix?  OK to call tomorrow

## 2014-08-12 NOTE — Telephone Encounter (Signed)
Pt okay to talk to son, Abe People.  Pt c/o of tingling and pain in both legs, tingling mostly in right left and pain "dull tooth ache" in left leg.  Pt has no c/o of swelling or redness of the extremities. Abe People noticed that since being home from the hospital pt has not been as active as normal.  He can get around but mainly sits in his chair. Pt thinks that it could be from the Plavix? Pt is cared for by Son and Daughter at home and son stated it is abnormal for pt to c/o of pain

## 2014-08-13 ENCOUNTER — Ambulatory Visit (INDEPENDENT_AMBULATORY_CARE_PROVIDER_SITE_OTHER): Payer: Medicare Other | Admitting: Physician Assistant

## 2014-08-13 ENCOUNTER — Encounter: Payer: Self-pay | Admitting: Physician Assistant

## 2014-08-13 ENCOUNTER — Other Ambulatory Visit: Payer: Self-pay | Admitting: Physician Assistant

## 2014-08-13 VITALS — BP 114/70 | HR 70 | Ht 68.0 in | Wt 191.2 lb

## 2014-08-13 DIAGNOSIS — E876 Hypokalemia: Secondary | ICD-10-CM

## 2014-08-13 DIAGNOSIS — I4891 Unspecified atrial fibrillation: Secondary | ICD-10-CM | POA: Diagnosis not present

## 2014-08-13 DIAGNOSIS — E785 Hyperlipidemia, unspecified: Secondary | ICD-10-CM

## 2014-08-13 DIAGNOSIS — I10 Essential (primary) hypertension: Secondary | ICD-10-CM

## 2014-08-13 LAB — BASIC METABOLIC PANEL
BUN: 18 mg/dL (ref 6–23)
CHLORIDE: 101 meq/L (ref 96–112)
CO2: 29 mEq/L (ref 19–32)
Calcium: 9.6 mg/dL (ref 8.4–10.5)
Creatinine, Ser: 1.24 mg/dL (ref 0.40–1.50)
GFR: 59.09 mL/min — ABNORMAL LOW (ref >60.00)
Glucose, Bld: 114 mg/dL — ABNORMAL HIGH (ref 70–99)
Potassium: 4.8 mEq/L (ref 3.5–5.1)
Sodium: 135 mEq/L (ref 135–145)

## 2014-08-13 NOTE — Patient Instructions (Addendum)
Medication Instructions:   Your physician has recommended you make the following change in your medication:   1. Stop Plavix  Lab work  Your physician recommends that you have labs today. BMET  Testing/Procedures:  NONE  Follow-Up:  Your physician recommends that you  follow-up as already scheduled with Dr. Johnsie Cancel   Any Other Special Instructions Will Be Listed Below (If Applicable).  Please get a blood pressure machine to check your blood pressure at home.

## 2014-08-13 NOTE — Progress Notes (Signed)
Cardiology Office Note   Date:  08/13/2014   ID:  Dakota [name], DOB 1929/04/04, MRN [id number]  PCP:  Dakota Stain, MD  Cardiologist:  Dr Normajean Baxter, PA-C   Chief Complaint  Patient presents with  . Numbness    History of Present Illness: Dakota [name] is a 81 y.o. male with a history of   He presents for evaluation of paresthesias of LE, weakness, and failure to increase his activity.  Dakota [name] is here today with his son. His son states that the patient started the Plavix 5 days after discharge as directed. Right after discharge, he was ambulating outside without difficulty, and seemed to be successfully increasing his activity. However, since starting the Plavix, he has had problems with balance and complains of paresthesias in both lower extremities. He has burning at times in the bottoms of his feet. He has lines of pain that go partly up one thigh or the other thigh that are spontaneous and not associated with any specific activity. His feet seem a little bit numb at times. He feels weak at times. He feels that his balance is poor and feels that he is at risk for falling. His family notes that his activity level is not where it normally is and that he is not as active around the house or taking care of his wife as he was prior to his admission. There is no weight gain, no lower extremity edema, no orthopnea, no PND, and no chest pain. He does not feel that his activity is limited by dyspnea, just by limitations in his lower extremities and general weakness.   Past Medical History  Diagnosis Date  . GERD (gastroesophageal reflux disease) 10/2002  . Hyperlipidemia 12/1994  . Hypertension   . Permanent atrial fibrillation     Refused coumadin therapy  . Back pain, chronic   . Lung collapse 03/1991    Fall  . Complete heart block 07/18/2014    Medtronic [place] model [id number] (serial number [id number])  singe lead PPM  . Hypokalemia    Past Surgical History    Procedure Laterality Date  . Back surgery  11/91    with hardware fixation  . Cardiac catheterization  06/2004    30 % stenosis, EF normal  . Eye surgery      Retinal bubble surgery, cataract   . Temporary pacemaker insertion N/A 07/18/2014    Procedure: TEMPORARY PACEMAKER INSERTION;  Surgeon: Dakota M Martinique, MD;  Location: [facility] CATH LAB;  Service: Cardiovascular;  Laterality: N/A;  . Left heart catheterization with coronary angiogram N/A 07/18/2014    Procedure: LEFT HEART CATHETERIZATION WITH CORONARY ANGIOGRAM;  Surgeon: Dakota Booze, MD; [facility] [place], LAD mild dz, D1 80%, D2 OK, CFX system OK, RCA OK, PDA 100%, med rx  . Permanent pacemaker insertion N/A 07/18/2014    Procedure: PERMANENT PACEMAKER INSERTION;  Surgeon: Dakota Grayer, MD; Medtronic [place] model [id number] (serial number [id number])      Current Outpatient Prescriptions  Medication Sig Dispense Refill  . ALPRAZolam (XANAX) 0.5 MG tablet TAKE ONE-HALF TABLET BY MOUTH TWICE DAILY AS NEEDED FOR ANXIETY 60 tablet 2  . aspirin 81 MG EC tablet Take 81 mg by mouth daily.      . calcium carbonate (TUMS - DOSED IN MG ELEMENTAL CALCIUM) 500 MG chewable tablet Chew 1 tablet by mouth daily as needed for indigestion.     . carvedilol (COREG) 25 MG tablet Take 1 tablet (25 mg  total) by mouth 2 (two) times daily with a meal. 180 tablet 3  . ranitidine (ZANTAC) 150 MG tablet Take 150 mg by mouth daily.     No current facility-administered medications for this visit.    Allergies:   Atorvastatin; Diazepam; Ezetimibe; and Penicillins    Social History:  The patient  reports that he quit smoking about 61 years ago. His smokeless tobacco use includes Chew. He reports that he does not drink alcohol or use illicit drugs.   Family History:  The patient's family history includes Cancer in his brother, brother, and father; Heart disease in his sister and son; Hip fracture in his mother; Pneumonia in his mother; Prostate cancer in his brother.  There is no history of Stroke or Colon cancer.    ROS:  Please see the history of present illness. All other systems are reviewed and negative.    PHYSICAL EXAM: VS:  BP 114/70 mmHg  Pulse 70  Ht 5\' 8"  (1.727 m)  Wt 191 lb 3.2 oz (86.728 kg)  BMI 29.08 kg/m2 , BMI Body mass index is 29.08 kg/(m^2). GEN: Well nourished, well developed, in no acute distress HEENT: normal Neck: no JVD, carotid bruits, or masses Cardiac: RRR; no murmurs, rubs, or gallops,no edema  Respiratory:  Scattered rales bilaterally, normal work of breathing, upper airway congestion, improves with cough and throat clearing GI: soft, nontender, nondistended, + BS MS: no deformity or atrophy Skin: warm and dry, no rash Neuro:  Strength and sensation are intact Psych: euthymic mood, full affect   EKG:  EKG is ordered today. The ekg ordered today demonstrates atrial fib, V pacing with 2 intrinsic beats. Of note, his heart rate increased to 115 with ambulation; oxygen saturation remained greater than 95%   Recent Labs: 06/30/2014: ALT 13 07/17/2014: B Natriuretic Peptide 578.5*; TSH 3.775 07/18/2014: Hemoglobin 14.3; Platelets 161 07/22/2014: BUN 30*; Creatinine 1.29; Potassium 3.3*; Sodium 140   Lipid Panel    Component Value Date/Time   CHOL 190 06/30/2014 0853   TRIG 70.0 06/30/2014 0853   HDL 40.40 06/30/2014 0853   CHOLHDL 5 06/30/2014 0853   VLDL 14.0 06/30/2014 0853   LDLCALC 136* 06/30/2014 0853   LDLDIRECT 155.7 10/19/2012 0851     Wt Readings from Last 3 Encounters:  08/13/14 191 lb 3.2 oz (86.728 kg)  07/22/14 188 lb 6.4 oz (85.458 kg)  07/03/14 197 lb 8 oz (89.585 kg)     Other studies Reviewed: Additional studies/ records that were reviewed today include: recent hospital records.  ASSESSMENT AND PLAN:  1.  Lower extremity paresthesias: He is almost 30 days out from his MI. He has been compliant with his medications but both the patient and his family feel the Plavix is responsible for  some of these symptoms. We will discontinue the Plavix, and see how tolerated.  2. Recent non-STEMI, medical therapy for occluded PDA: The patient is not having any ischemic symptoms. EF was 55% by echocardiogram. Continue beta blocker and aspirin. He has not tolerated statins in the past. If his current symptoms resolved, consider adding a low-dose of statin in follow-up.  3. Hypertension: Mr. Fosberg has been on carvedilol 25 mg twice a day for a long time. His blood pressure is well controlled. It is unclear if his blood pressure is significantly lower than it was prior to his recent event. Recommend a obtain a home blood pressure monitor and track his blood pressure. If his systolic blood pressure is less than 100 at times,  the carvedilol may need to be decreased.  4. General weakness, possible deconditioning: He has home physical therapy scheduled and is encouraged to follow-up with them.  5. Hypokalemia: He was hypokalemic upon admission to the hospital and again at discharge. He is not on diuretic therapy. We will recheck a BMET today and follow up on those results.  6. Hyperglycemia: He has a history of hyperglycemia and his blood sugar was 297 on admission to the hospital. He has no history of diabetes. The BMET will check his blood sugar and if it is significantly elevated, he will need to follow-up with his primary care physician.   Current medicines are reviewed at length with the patient today.  The patient has concerns regarding medicines.  The following changes have been made:  D/c Plavix  Labs/ tests ordered today include: BMET  Orders Placed This Encounter  Procedures  . EKG 12-Lead     Disposition:   FU with Dr. Johnsie Cancel as scheduled  Signed, Rosaria Ferries, PA-C  08/13/2014 2:53 PM    [address], [place], [place]  [address] Phone: [phone]; Fax: [phone]

## 2014-08-13 NOTE — Telephone Encounter (Signed)
Spoke with [facility] and pt. Pt son bringing him today at 1:30pm to be evaluated.

## 2014-08-13 NOTE — Telephone Encounter (Signed)
Have him seen by flex PA and they can decide if he needs ABI"s or venous US

## 2014-08-14 DIAGNOSIS — Z48812 Encounter for surgical aftercare following surgery on the circulatory system: Secondary | ICD-10-CM | POA: Diagnosis not present

## 2014-08-14 DIAGNOSIS — I4891 Unspecified atrial fibrillation: Secondary | ICD-10-CM | POA: Diagnosis not present

## 2014-08-14 DIAGNOSIS — I10 Essential (primary) hypertension: Secondary | ICD-10-CM | POA: Diagnosis not present

## 2014-08-14 DIAGNOSIS — I2510 Atherosclerotic heart disease of native coronary artery without angina pectoris: Secondary | ICD-10-CM | POA: Diagnosis not present

## 2014-08-14 DIAGNOSIS — E785 Hyperlipidemia, unspecified: Secondary | ICD-10-CM | POA: Diagnosis not present

## 2014-08-14 DIAGNOSIS — I214 Non-ST elevation (NSTEMI) myocardial infarction: Secondary | ICD-10-CM | POA: Diagnosis not present

## 2014-08-19 ENCOUNTER — Telehealth: Payer: Self-pay | Admitting: Cardiovascular Disease

## 2014-08-19 NOTE — Telephone Encounter (Signed)
New message     Want potassium results

## 2014-08-19 NOTE — Telephone Encounter (Signed)
Spoke with son who states that he brought pt in 08/13/14 (BMET) for labs and was wondering about results. Informed son that labs had not yet been resulted. Son also wanted to know if Dr. Johnsie Cancel would be ok with refilling pt's Zantac? Informed son that Dr. Johnsie Cancel was out of the office today but that I would route this information to him for review and advisement on labs and if ok to refill Zantac? Son verbalized understanding.

## 2014-08-20 MED ORDER — RANITIDINE HCL 150 MG PO TABS: 150.0000 mg | ORAL_TABLET | Freq: Every day | ORAL | Status: DC

## 2014-08-20 NOTE — Telephone Encounter (Signed)
Ok to fill zantac although this should be over the counter BMET is fine

## 2014-08-20 NOTE — Telephone Encounter (Signed)
Spoke with pt and made him aware BMET is fine and that Dr. Johnsie Cancel said it was ok to fill his Zantac although this should be OTC. Pt states that he prefers to fill them through the pharmacy. Pt verbalized understanding.

## 2014-09-15 DIAGNOSIS — I214 Non-ST elevation (NSTEMI) myocardial infarction: Secondary | ICD-10-CM | POA: Diagnosis not present

## 2014-09-15 DIAGNOSIS — Z48812 Encounter for surgical aftercare following surgery on the circulatory system: Secondary | ICD-10-CM | POA: Diagnosis not present

## 2014-09-15 DIAGNOSIS — I4891 Unspecified atrial fibrillation: Secondary | ICD-10-CM | POA: Diagnosis not present

## 2014-09-15 DIAGNOSIS — I2510 Atherosclerotic heart disease of native coronary artery without angina pectoris: Secondary | ICD-10-CM | POA: Diagnosis not present

## 2014-09-17 ENCOUNTER — Other Ambulatory Visit: Payer: Self-pay | Admitting: Cardiovascular Disease

## 2014-09-18 ENCOUNTER — Telehealth: Payer: Self-pay | Admitting: Cardiovascular Disease

## 2014-09-18 NOTE — Telephone Encounter (Signed)
New message     Last night pt had "spasms in his leg".  Is this related to his heart or heart medications?

## 2014-09-18 NOTE — Telephone Encounter (Signed)
SPOKE  WITH PT'S  SON   RE  MESSAGE  PT  HAD EPISODE  LAST  NIGHT THAT  SCARED HIM  COMPLAINED OF SPASM LIKE  SENSATION IN LEG AND  ABOUT  AN HOUR LATER  HAD  ANOTHER  EPISODE  IN OTHER  LEG  NO OTHER  COMPLAINTS  IS WORRIED  MAYBE  HEART  RELATED INFORMED  SON NOT LIKELY   RELATED.  MAY HAVE BEEN  AN EPISODE OF RESTLESS  LEG   SINCE  NOT  COMPLAINING  WITH  CRAMPING  LIKE  FEELING.  ENCOURAGED  TO  CONT TO MONITOR   AND  IF  EPISODES  OCCUR MORE FREQUENT  OR  LAST LONGER TO  CALL BACK  IS  SCHEDULED FOR  F/U 09/30/14 WILL FORWARD TO  DR Johnsie Cancel  FOR  REVIEW .Adonis Housekeeper

## 2014-09-28 NOTE — Progress Notes (Signed)
Cardiology Office Note   Date:  09/28/2014   ID:  Dakota [name], DOB 19-Aug-1928, MRN [id number]  PCP:  Dakota Stain, MD  Cardiologist:  Dr Dakota Conroy, MD   No chief complaint on file.   History of Present Illness: Dakota [name] is seen today for f/U of afib and HTN. Marland KitchenHe has chronic afib and has refused to be on coumadin. Forutuanatly he has not had a CVA, TIA or embolic event. His BP is under good control. I discussed Pradaxa with him and he still wishes to be just on asa since he feels so well I encouraged him to F/U with Dr Dakota [name] as his vision is still a problem Wife has bad back problems with parkinsons and is essentially total care. Encouraged him to look into assisted living for her He tried this for 2 months but didn't think they looked after her well   07/26/14 D/C after SEMI with PDA occlusion.  Symptomatic CHB with pacer implant  07/18/14  Echo reviewed EF 55% no significant valve disease   Seen by PA 4/13  for evaluation of paresthesias of LE, weakness, and failure to increase his activity. Plavix stopped that visit Much better off this med   Discussed issues with previous statin Long time ago indicates losing weight and weakness details not clear     Past Medical History  Diagnosis Date  . GERD (gastroesophageal reflux disease) 10/2002  . Hyperlipidemia 12/1994  . Hypertension   . Permanent atrial fibrillation     Refused coumadin therapy  . Back pain, chronic   . Lung collapse 03/1991    Fall  . Complete heart block 07/18/2014    Medtronic [place] model [id number] (serial number [id number])  singe lead PPM  . Hypokalemia    Past Surgical History  Procedure Laterality Date  . Back surgery  11/91    with hardware fixation  . Cardiac catheterization  06/2004    30 % stenosis, EF normal  . Eye surgery      Retinal bubble surgery, cataract   . Temporary pacemaker insertion N/A 07/18/2014    Procedure: TEMPORARY PACEMAKER INSERTION;  Surgeon: Dakota Soberanis M Martinique, MD;   [facility]: [facility] C[facility] LAB;  Service: Cardiovascular;  Laterality: N/A;  . Left heart catheterization with coronary angiogram N/A 07/18/2014    Procedure: LEFT HEART CATHETERIZATION WITH CORONARY ANGIOGRAM;  Surgeon: Dakota Booze, MD; [facility] [place], LAD mild dz, D1 80%, D2 OK, CFX system OK, RCA OK, PDA 100%, med rx  . Permanent pacemaker insertion N/A 07/18/2014    Procedure: PERMANENT PACEMAKER INSERTION;  Surgeon: Dakota Grayer, MD; Medtronic [place] model [id number] (serial number [id number])      Current Outpatient Prescriptions  Medication Sig Dispense Refill  . ALPRAZolam (XANAX) 0.5 MG tablet TAKE ONE-HALF TABLET BY MOUTH TWICE DAILY AS NEEDED FOR ANXIETY 60 tablet 2  . aspirin 81 MG EC tablet Take 81 mg by mouth daily.      . calcium carbonate (TUMS - DOSED IN MG ELEMENTAL CALCIUM) 500 MG chewable tablet Chew 1 tablet by mouth daily as needed for indigestion.     . carvedilol (COREG) 25 MG tablet Take 1 tablet (25 mg total) by mouth 2 (two) times daily with a meal. 180 tablet 3  . ranitidine (ZANTAC) 150 MG tablet TAKE ONE TABLET BY MOUTH ONCE DAILY 30 tablet 5   No current facility-administered medications for this visit.    Allergies:   Atorvastatin; Diazepam; Ezetimibe; and Penicillins  Social History:  The patient  reports that he quit smoking about 61 years ago. His smokeless tobacco use includes Chew. He reports that he does not drink alcohol or use illicit drugs.   Family History:  The patient's family history includes Cancer in his brother, brother, and father; Heart disease in his sister and son; Hip fracture in his mother; Pneumonia in his mother; Prostate cancer in his brother. There is no history of Stroke or Colon cancer.    ROS:  Please see the history of present illness. All other systems are reviewed and negative.    PHYSICAL EXAM: VS:  There were no vitals taken for this visit. , BMI There is no weight on file to calculate BMI. GEN: Well nourished, well developed, in  no acute distress HEENT: normal Neck: no JVD, carotid bruits, or masses Cardiac: RRR; no murmurs, rubs, or gallops,no edema  Pacer under left clavicle  Respiratory:  Scattered rales bilaterally, normal work of breathing, upper airway congestion, improves with cough and throat clearing GI: soft, nontender, nondistended, + BS MS: no deformity or atrophy Skin: warm and dry, no rash Neuro:  Strength and sensation are intact Psych: euthymic mood, full affect   EKG:   08/13/14   atrial fib, V pacing with 2 intrinsic beats. Of note, his heart rate increased to 115 with ambulation; oxygen saturation remained greater than 95%   Recent Labs: 06/30/2014: ALT 13 07/17/2014: B Natriuretic Peptide 578.5*; TSH 3.775 07/18/2014: Hemoglobin 14.3; Platelets 161 08/13/2014: BUN 18; Creatinine 1.24; Potassium 4.8; Sodium 135   Lipid Panel    Component Value Date/Time   CHOL 190 06/30/2014 0853   TRIG 70.0 06/30/2014 0853   HDL 40.40 06/30/2014 0853   CHOLHDL 5 06/30/2014 0853   VLDL 14.0 06/30/2014 0853   LDLCALC 136* 06/30/2014 0853   LDLDIRECT 155.7 10/19/2012 0851     Wt Readings from Last 3 Encounters:  08/13/14 191 lb 3.2 oz (86.728 kg)  07/22/14 188 lb 6.4 oz (85.458 kg)  07/03/14 197 lb 8 oz (89.585 kg)     Other studies Reviewed: Additional studies/ records that were reviewed today include: recent hospital records.  ASSESSMENT AND PLAN:  1.  Lower extremity paresthesias: resolved off plavix  2. Recent non-STEMI, medical therapy for occluded PDA: The patient is not having any ischemic symptoms. EF was 55% by echocardiogram. Continue beta blocker and aspirin. Plavix stopped due to side effects   3. Hypertension: [name] has been on carvedilol 25 mg twice a day for a long time. His blood pressure is well controlled. It is unclear if his blood pressure is significantly lower than it was prior to his recent event Home readings are fine according to son  4. General weakness, possible  deconditioning: He has home physical therapy scheduled and is encouraged to follow-up with them.  5. Hypokalemia: resovled   6. Hyperglycemia: He has a history of hyperglycemia and his blood sugar was 297 on admission to the hospital. He has no history of diabetes. The BMET will check his blood sugar and if it is significantly elevated, he will need to follow-up with his primary care physician.  7. Pacer :  Check normal has f/u in June pocket has healed well  8. Chol:  Check labs 3 months and consider starting statin    Current medicines are reviewed at length with the patient today.  The patient has concerns regarding medicines.  The following changes have been made: None  Labs/ tests ordered today include:  Chol/Liver in 3 months   No orders of the defined types were placed in this encounter.     Disposition:   FU with me in 3 months   Signed, Jenkins Rouge, MD  09/28/2014 11:29 AM    [facility] [place], [place],   [address] Phone: [phone]; Fax: [phone]

## 2014-09-30 ENCOUNTER — Ambulatory Visit (INDEPENDENT_AMBULATORY_CARE_PROVIDER_SITE_OTHER): Payer: Medicare Other | Admitting: Cardiovascular Disease

## 2014-09-30 ENCOUNTER — Encounter: Payer: Self-pay | Admitting: Cardiovascular Disease

## 2014-09-30 VITALS — BP 102/64 | HR 85 | Ht 69.0 in | Wt 193.8 lb

## 2014-09-30 DIAGNOSIS — I2510 Atherosclerotic heart disease of native coronary artery without angina pectoris: Secondary | ICD-10-CM

## 2014-09-30 DIAGNOSIS — Z0000 Encounter for general adult medical examination without abnormal findings: Secondary | ICD-10-CM

## 2014-09-30 MED ORDER — CARVEDILOL 25 MG PO TABS: 25.0000 mg | ORAL_TABLET | Freq: Two times a day (BID) | ORAL | Status: DC

## 2014-09-30 NOTE — Patient Instructions (Signed)
Medication Instructions:  NO CHANGES   Labwork: FASTING LIPID  AND LIVER     IN  3 MONTHS  SEE  DR  Johnsie Cancel  AFTER END  OF  AUGUST   Testing/Procedures: NONE  Follow-Up:  Your physician recommends that you schedule a follow-up appointment XO:GAC[facility] AFTER   LABS  DONE  WITH DR Johnsie Cancel  Any Other Special Instructions Will Be Listed Below (If Applicable).

## 2014-10-22 ENCOUNTER — Encounter: Payer: Self-pay | Admitting: Internal Medicine

## 2014-10-22 ENCOUNTER — Ambulatory Visit (INDEPENDENT_AMBULATORY_CARE_PROVIDER_SITE_OTHER): Payer: Medicare Other | Admitting: Internal Medicine

## 2014-10-22 VITALS — BP 122/72 | HR 71 | Ht 68.0 in | Wt 198.0 lb

## 2014-10-22 DIAGNOSIS — I442 Atrioventricular block, complete: Secondary | ICD-10-CM

## 2014-10-22 DIAGNOSIS — I482 Chronic atrial fibrillation, unspecified: Secondary | ICD-10-CM

## 2014-10-22 DIAGNOSIS — I2510 Atherosclerotic heart disease of native coronary artery without angina pectoris: Secondary | ICD-10-CM | POA: Diagnosis not present

## 2014-10-22 LAB — CUP PACEART INCLINIC DEVICE CHECK
Battery Remaining Longevity: 117 mo
Battery Voltage: 2.78 V
Lead Channel Impedance Value: 622 Ohm
Lead Channel Pacing Threshold Amplitude: 0.75 V
Lead Channel Pacing Threshold Pulse Width: 0.4 ms
Lead Channel Setting Pacing Amplitude: 2.5 V
Lead Channel Setting Pacing Pulse Width: 0.4 ms
MDC IDC MSMT BATTERY IMPEDANCE: 132 Ohm
MDC IDC MSMT LEADCHNL RA IMPEDANCE VALUE: 0 Ohm
MDC IDC MSMT LEADCHNL RV SENSING INTR AMPL: 4 mV
MDC IDC SESS DTM: 20160622112351
MDC IDC SET LEADCHNL RV SENSING SENSITIVITY: 2 mV
MDC IDC STAT BRADY RV PERCENT PACED: 35 %

## 2014-10-22 NOTE — Progress Notes (Signed)
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 male who presents today for routine electrophysiology followup.  Since his recent PPM implant, the patient reports doing very well.   He was placed on plavix s/p NSTEMI but did not tolerate this due to leg weakness.  Today, he denies symptoms of palpitations, chest pain, shortness of breath,  lower extremity edema, dizziness, presyncope, or syncope.  The patient is otherwise without complaint today.   Past Medical History  Diagnosis Date  . GERD (gastroesophageal reflux disease) 10/2002  . Hyperlipidemia 12/1994  . Hypertension   . Permanent atrial fibrillation     Refused coumadin therapy  . Back pain, chronic   . Lung collapse 03/1991    Fall  . Complete heart block 07/18/2014    Medtronic [place] model [id number] (serial number [id number])  singe lead PPM  . Hypokalemia    Past Surgical History  Procedure Laterality Date  . Back surgery  11/91    with hardware fixation  . Cardiac catheterization  06/2004    30 % stenosis, EF normal  . Eye surgery      Retinal bubble surgery, cataract   . Temporary pacemaker insertion N/A 07/18/2014    Procedure: TEMPORARY PACEMAKER INSERTION;  Surgeon: Peter M Martinique, MD;  Location: [facility] CATH LAB;  Service: Cardiovascular;  Laterality: N/A;  . Left heart catheterization with coronary angiogram N/A 07/18/2014    Procedure: LEFT HEART CATHETERIZATION WITH CORONARY ANGIOGRAM;  Surgeon: Jettie Booze, MD; [facility] [place], LAD mild dz, D1 80%, D2 OK, CFX system OK, RCA OK, PDA 100%, med rx  . Permanent pacemaker insertion N/A 07/18/2014    Procedure: PERMANENT PACEMAKER INSERTION;  Surgeon: Thompson Grayer, MD; Medtronic [place] model [phone] (serial number [id number])      ROS- all systems are reviewed and negative except as per HPI above  Current Outpatient Prescriptions  Medication Sig Dispense Refill  . ALPRAZolam (XANAX) 0.5 MG tablet TAKE ONE-HALF TABLET BY MOUTH TWICE DAILY  AS NEEDED FOR ANXIETY 60 tablet 2  . aspirin 81 MG EC tablet Take 81 mg by mouth daily.      . calcium carbonate (TUMS - DOSED IN MG ELEMENTAL CALCIUM) 500 MG chewable tablet Chew 1 tablet by mouth daily as needed for indigestion.     . carvedilol (COREG) 25 MG tablet Take 1 tablet (25 mg total) by mouth 2 (two) times daily with a meal. 180 tablet 3  . ranitidine (ZANTAC) 150 MG tablet TAKE ONE TABLET BY MOUTH ONCE DAILY (Patient taking differently: take one tablet twice a day) 30 tablet 5   No current facility-administered medications for this visit.    Physical Exam: Filed Vitals:   10/22/14 1042  BP: 122/72  Pulse: 71  Height: 5\' 8"  (1.727 m)  Weight: 89.812 kg (198 lb)    GEN- The patient is well appearing, alert and oriented x 3 today.   Head- normocephalic, atraumatic Eyes-  Sclera clear, conjunctiva pink Ears- hearing intact Oropharynx- clear Lungs- Clear to ausculation bilaterally, normal work of breathing Chest- pacemaker pocket is well healed Heart- Regular rate and rhythm, no murmurs, rubs or gallops, PMI not laterally displaced GI- soft, NT, ND, + BS Extremities- no clubbing, cyanosis, or edema  Pacemaker interrogation- reviewed in detail today,  See PACEART report ekg today reveals afib with demand V pacing  Assessment and Plan:  1. AV block Normal pacemaker function See Pace Art report No changes today  2.  Permanent afib chads2vasc score is at least 4.  He is clear in his decision to decline anticoagulation presently but may be willing to reconsider when he sees Dr Johnsie Cancel in September.  Would consider eliquis at that time. Rate controlled  3. CAD No ischemic symptoms  Follow-up with Dr Johnsie Cancel as scheduled [facility] Return to see EP NP in 1 year

## 2014-10-22 NOTE — Patient Instructions (Addendum)
Medication Instructions:  Your physician recommends that you continue on your current medications as directed. Please refer to the Current Medication list given to you today.   Labwork: None ordered  Testing/Procedures: none ordered  Follow-Up: Your physician wants you to follow-up in: 12 months with Chanetta Marshall, NOP You will receive a reminder letter in the mail two months in advance. If you don't receive a letter, please call our office to schedule the follow-up appointment.  Remote monitoring is used to monitor your Pacemaker or ICD from home. This monitoring reduces the number of office visits required to check your device to one time per year. It allows [place] to keep an eye on the functioning of your device to ensure it is working properly. You are scheduled for a device check from home on 01/21/15. You may send your transmission at any time that day. If you have a wireless device, the transmission will be sent automatically. After your physician reviews your transmission, you will receive a postcard with your next transmission date.    Any Other Special Instructions Will Be Listed Below (If Applicable).

## 2014-11-06 ENCOUNTER — Encounter: Payer: Self-pay | Admitting: Internal Medicine

## 2014-12-29 ENCOUNTER — Telehealth: Payer: Self-pay | Admitting: Cardiovascular Disease

## 2014-12-29 ENCOUNTER — Emergency Department (HOSPITAL_COMMUNITY)
Admission: EM | Admit: 2014-12-29 | Discharge: 2014-12-29 | Disposition: A | Discharge status: Home / Self Care | Payer: Medicare Other | Attending: Emergency Medicine | Admitting: Emergency Medicine

## 2014-12-29 ENCOUNTER — Encounter (HOSPITAL_COMMUNITY): Payer: Self-pay | Admitting: Emergency Medicine

## 2014-12-29 DIAGNOSIS — Z87891 Personal history of nicotine dependence: Secondary | ICD-10-CM | POA: Insufficient documentation

## 2014-12-29 DIAGNOSIS — Z79899 Other long term (current) drug therapy: Secondary | ICD-10-CM | POA: Diagnosis not present

## 2014-12-29 DIAGNOSIS — I10 Essential (primary) hypertension: Secondary | ICD-10-CM | POA: Diagnosis present

## 2014-12-29 DIAGNOSIS — Z8639 Personal history of other endocrine, nutritional and metabolic disease: Secondary | ICD-10-CM | POA: Diagnosis not present

## 2014-12-29 DIAGNOSIS — K219 Gastro-esophageal reflux disease without esophagitis: Secondary | ICD-10-CM | POA: Insufficient documentation

## 2014-12-29 DIAGNOSIS — Z880 Allergy status to penicillin: Secondary | ICD-10-CM | POA: Diagnosis not present

## 2014-12-29 DIAGNOSIS — R079 Chest pain, unspecified: Secondary | ICD-10-CM | POA: Diagnosis not present

## 2014-12-29 DIAGNOSIS — I159 Secondary hypertension, unspecified: Secondary | ICD-10-CM | POA: Diagnosis not present

## 2014-12-29 DIAGNOSIS — R0789 Other chest pain: Secondary | ICD-10-CM | POA: Diagnosis not present

## 2014-12-29 DIAGNOSIS — F17200 Nicotine dependence, unspecified, uncomplicated: Secondary | ICD-10-CM | POA: Diagnosis not present

## 2014-12-29 DIAGNOSIS — Z7982 Long term (current) use of aspirin: Secondary | ICD-10-CM | POA: Diagnosis not present

## 2014-12-29 DIAGNOSIS — G8929 Other chronic pain: Secondary | ICD-10-CM | POA: Diagnosis not present

## 2014-12-29 LAB — URINE MICROSCOPIC-ADD ON

## 2014-12-29 LAB — BASIC METABOLIC PANEL
Anion gap: 5 (ref 5–15)
BUN: 16 mg/dL (ref 6–20)
CO2: 23 mmol/L (ref 22–32)
Calcium: 9.3 mg/dL (ref 8.9–10.3)
Chloride: 109 mmol/L (ref 101–111)
Creatinine, Ser: 1.05 mg/dL (ref 0.61–1.24)
GFR calc non Af Amer: >60 mL/min (ref >60)
Glucose, Bld: 124 mg/dL — ABNORMAL HIGH (ref 65–99)
POTASSIUM: 4.1 mmol/L (ref 3.5–5.1)
SODIUM: 137 mmol/L (ref 135–145)

## 2014-12-29 LAB — CBC WITH DIFFERENTIAL/PLATELET
Basophils Absolute: 0.1 10*3/uL (ref 0.0–0.1)
Basophils Relative: 1 % (ref 0–1)
Eosinophils Absolute: 0.5 10*3/uL (ref 0.0–0.7)
Eosinophils Relative: 6 % — ABNORMAL HIGH (ref 0–5)
HCT: 46.8 % (ref 39.0–52.0)
HEMOGLOBIN: 16.3 g/dL (ref 13.0–17.0)
LYMPHS ABS: 2.8 10*3/uL (ref 0.7–4.0)
LYMPHS PCT: 33 % (ref 12–46)
MCH: 30.8 pg (ref 26.0–34.0)
MCHC: 34.8 g/dL (ref 30.0–36.0)
MCV: 88.3 fL (ref 78.0–100.0)
Monocytes Absolute: 0.9 10*3/uL (ref 0.1–1.0)
Monocytes Relative: 11 % (ref 3–12)
NEUTROS PCT: 49 % (ref 43–77)
Neutro Abs: 4.1 10*3/uL (ref 1.7–7.7)
Platelets: 177 10*3/uL (ref 150–400)
RBC: 5.3 MIL/uL (ref 4.22–5.81)
RDW: 13.3 % (ref 11.5–15.5)
WBC: 8.4 10*3/uL (ref 4.0–10.5)

## 2014-12-29 LAB — URINALYSIS, ROUTINE W REFLEX MICROSCOPIC
Bilirubin Urine: NEGATIVE
GLUCOSE, UA: NEGATIVE mg/dL
Ketones, ur: NEGATIVE mg/dL
LEUKOCYTES UA: NEGATIVE
Nitrite: NEGATIVE
PH: 5.5 (ref 5.0–8.0)
Protein, ur: 100 mg/dL — AB
SPECIFIC GRAVITY, URINE: 1.02 (ref 1.005–1.030)
Urobilinogen, UA: 1 mg/dL (ref 0.0–1.0)

## 2014-12-29 NOTE — Telephone Encounter (Signed)
New Message        Pt calling stating that when he was in the hospital they did lots of labs and wants to know if he still needs to come in for his lab appt for tomorrow. Please call back and advise.

## 2014-12-29 NOTE — ED Notes (Signed)
Family at bedside; no signs of distress.

## 2014-12-29 NOTE — ED Notes (Signed)
interrogation in progress.

## 2014-12-29 NOTE — ED Notes (Signed)
Pt denies chest pain and SOB; reports that he took pressure this morning and called MD but could not get in touch with them so came in.

## 2014-12-29 NOTE — ED Provider Notes (Signed)
CSN: [id number]     Arrival date & time 12/29/14  0706 History   First MD Initiated Contact with Patient 12/29/14 [phone]     Chief Complaint  Patient presents with  . Hypertension     (Consider location/radiation/quality/duration/timing/severity/associated sxs/prior Treatment) HPI   Dakota [name] is a 81 y.o. male who presents for evaluation of burning sensation in feet. He has also noticed periods of sweating and a very brief episode of chest discomfort. Chest discomfort felt like his pacemaker was firing. This discomfort lasts a few seconds to a minute, then resolves spontaneously. He does not describe palpitations. He has not had any near syncope or syncope. He does not have any prolonged periods of chest pain. He did not feel weak or dizzy during these episodes. The symptoms started yesterday. He was able to sleep most of the night. At home he checked his blood pressure was higher than usual, with the highest being 171/116. He ate well yesterday. He did not eat this morning, but he didn't take his morning medications. He denies fever, chills, cough, shortness of breath, change in bowel or urinary habits. There are no other known modifying factors.   Past Medical History  Diagnosis Date  . GERD (gastroesophageal reflux disease) 10/2002  . Hyperlipidemia 12/1994  . Hypertension   . Permanent atrial fibrillation     Refused coumadin therapy  . Back pain, chronic   . Lung collapse 03/1991    Fall  . Complete heart block 07/18/2014    Medtronic [place] model [id number] (serial number [id number])  singe lead PPM  . Hypokalemia    Past Surgical History  Procedure Laterality Date  . Back surgery  11/91    with hardware fixation  . Cardiac catheterization  06/2004    30 % stenosis, EF normal  . Eye surgery      Retinal bubble surgery, cataract   . Temporary pacemaker insertion N/A 07/18/2014    Procedure: TEMPORARY PACEMAKER INSERTION;  Surgeon: Peter M Martinique, MD;  Location: [facility] CATH LAB;   Service: Cardiovascular;  Laterality: N/A;  . Left heart catheterization with coronary angiogram N/A 07/18/2014    Procedure: LEFT HEART CATHETERIZATION WITH CORONARY ANGIOGRAM;  Surgeon: Jettie Booze, MD; [facility] [place], LAD mild dz, D1 80%, D2 OK, CFX system OK, RCA OK, PDA 100%, med rx  . Permanent pacemaker insertion N/A 07/18/2014    Procedure: PERMANENT PACEMAKER INSERTION;  Surgeon: Thompson Grayer, MD; Medtronic [place] (serial number [id number])     Family History  Problem Relation Age of Onset  . Pneumonia Mother   . Hip fracture Mother   . Cancer Father     splenic  . Heart disease Sister     Pacer placed  . Cancer Brother     throat and lung  . Heart disease Son   . Stroke Neg Hx   . Colon cancer Neg Hx   . Cancer Brother     prostate, age 9  . Prostate cancer Brother    Social History  Substance Use Topics  . Smoking status: Former Smoker -- 1.00 packs/day for 15 years    Quit date: 05/02/1953  . Smokeless tobacco: Current User    Types: Chew     Comment: Quit smoking over 50 years ago  . Alcohol Use: No    Review of Systems  All other systems reviewed and are negative.     Allergies  Warfarin and related; Atorvastatin; Diazepam; Ezetimibe; and Penicillins  Home Medications  Prior to Admission medications   Medication Sig Start Date End Date Taking? Authorizing Provider  ALPRAZolam Duanne Moron) 0.5 MG tablet TAKE ONE-HALF TABLET BY MOUTH TWICE DAILY AS NEEDED FOR ANXIETY 06/25/14  Yes Tonia Ghent, MD  aspirin 81 MG EC tablet Take 81 mg by mouth daily.     Yes Historical Provider, MD  calcium carbonate (TUMS - DOSED IN MG ELEMENTAL CALCIUM) 500 MG chewable tablet Chew 1 tablet by mouth daily as needed for indigestion.    Yes Historical Provider, MD  carvedilol (COREG) 25 MG tablet Take 1 tablet (25 mg total) by mouth 2 (two) times daily with a meal. 09/30/14  Yes Josue Hector, MD  ranitidine (ZANTAC) 150 MG tablet TAKE ONE TABLET BY MOUTH ONCE  DAILY 09/17/14  Yes Josue Hector, MD   BP 127/68 mmHg  Pulse 61  Temp(Src) 97.9 F (36.6 C) (Oral)  Resp 16  SpO2 96% Physical Exam  Constitutional: He is oriented to person, place, and time. He appears well-developed.  Elderly, frail  HENT:  Head: Normocephalic and atraumatic.  Right Ear: External ear normal.  Left Ear: External ear normal.  Eyes: Conjunctivae and EOM are normal. Pupils are equal, round, and reactive to light.  Neck: Normal range of motion and phonation normal. Neck supple.  Cardiovascular: Normal rate, regular rhythm and normal heart sounds.   Normal dorsalis pedis pulses bilaterally.  Pulmonary/Chest: Effort normal and breath sounds normal. He exhibits no bony tenderness.  Abdominal: Soft. There is no tenderness.  Musculoskeletal: Normal range of motion. He exhibits edema (1+ bilateral lower extremities.).  Neurological: He is alert and oriented to person, place, and time. No cranial nerve deficit or sensory deficit. He exhibits normal muscle tone. Coordination normal.  Normal sensation in arms and legs bilaterally. No pronator drift. No ataxia.  Skin: Skin is warm, dry and intact.  Psychiatric: He has a normal mood and affect. His behavior is normal. Judgment and thought content normal.  Nursing note and vitals reviewed.   ED Course  Procedures (including critical care time)  Medications - No data to display  Patient Vitals for the past 24 hrs:  BP Temp Temp src Pulse Resp SpO2  12/29/14 0945 127/68 mmHg - - 61 - 96 %  12/29/14 0915 135/79 mmHg - - 64 - 95 %  12/29/14 0900 164/77 mmHg - - 69 - 94 %  12/29/14 0845 148/93 mmHg - - 61 - 92 %  12/29/14 0830 157/71 mmHg - - 66 - 95 %  12/29/14 0815 137/80 mmHg - - 63 - 95 %  12/29/14 0800 (!) 176/110 mmHg - - 86 - 97 %  12/29/14 0745 150/90 mmHg - - 73 - 93 %  12/29/14 0730 158/97 mmHg - - 77 - 96 %  12/29/14 0716 (!) 181/108 mmHg 97.9 F (36.6 C) Oral 72 16 96 %  12/29/14 0715 (!) 181/108 mmHg - - 76 -  97 %   Medtronic pacemaker was interrogated. I discussed the findings with the Medtronic technician. Patient is pacing about 50% of the time. There have been no episodes of cardiac pauses. They have been no episodes of significant arrhythmia.  9:44 AM Reevaluation with update and discussion. After initial assessment and treatment, an updated evaluation reveals clinical exam is unchanged. He's been able to ambulate easily in the ED, without problems. Findings discussed with patient and son, all questions were answered.. Isabela METABOLIC PANEL - Abnormal;  Notable for the following:    Glucose, Bld 124 (*)    All other components within normal limits  CBC WITH DIFFERENTIAL/PLATELET - Abnormal; Notable for the following:    Eosinophils Relative 6 (*)    All other components within normal limits  URINALYSIS, ROUTINE W REFLEX MICROSCOPIC (NOT AT [facility]) - Abnormal; Notable for the following:    Hgb urine dipstick MODERATE (*)    Protein, ur 100 (*)    All other components within normal limits  URINE MICROSCOPIC-ADD ON - Abnormal; Notable for the following:    Squamous Epithelial / LPF FEW (*)    Bacteria, UA FEW (*)    Casts GRANULAR CAST (*)    All other components within normal limits    Imaging Review No results found. I have personally reviewed and evaluated these images and lab results as part of my medical decision-making.   EKG Interpretation None      MDM   Final diagnoses:  Nonspecific chest pain  Secondary hypertension, unspecified    Nonspecific chest discomfort, likely related to intermittent cardiac pacing. Doubt ACS, PE or pneumonia. Transient hypertension likely related to Diurnal variation and is appropriately treated.  Nursing Notes Reviewed/ Care Coordinated Applicable Imaging Reviewed Interpretation of Laboratory Data incorporated into ED treatment  The patient appears reasonably screened and/or stabilized for  discharge and I doubt any other medical condition or other [facility] requiring further screening, evaluation, or treatment in the ED at this time prior to discharge.  Plan: Home Medications- usual; Home Treatments- rest; return here if the recommended treatment, does not improve the symptoms; Recommended follow up- PCP next week as scheduled   Daleen Bo, MD 12/29/14 [phone]

## 2014-12-29 NOTE — ED Notes (Signed)
PT ambulated 50+ ft in hallway. Steady gait. Denies dizziness or lightheadedness. Pt denies any pain during ambulation. Pt states only that his eyes are dry. PT returned to room. Monitored by pulse ox and bp cuff.

## 2014-12-29 NOTE — ED Notes (Signed)
Patient alert and oriented at discharge.  Patient refused assistance of a wheelchair at discharge.  Patient taken home by son at bedside.

## 2014-12-29 NOTE — Telephone Encounter (Signed)
LM WILL  CALL  BACK IN AM .Dakota [name]

## 2014-12-29 NOTE — ED Notes (Signed)
Pt ambulated without distress.

## 2014-12-29 NOTE — ED Notes (Signed)
Pt arrives via POV from home, states he woke up sweaty this AM thinking he had a fever. Denies cough, dsyuria, pain.  Pt afebrile at present. Has also noted his blood pressure to be running high. Currently c/o burning pain in his feet. Pt alert, oriented x4, NAD at present.

## 2014-12-29 NOTE — Discharge Instructions (Signed)
Chest Pain (Nonspecific) °It is often hard to give a specific diagnosis for the cause of chest pain. There is always a chance that your pain could be related to something serious, such as a heart attack or a blood clot in the lungs. You need to follow up with your health care provider for further evaluation. °CAUSES  °· Heartburn. °· Pneumonia or bronchitis. °· Anxiety or stress. °· Inflammation around your heart (pericarditis) or lung (pleuritis or pleurisy). °· A blood clot in the lung. °· A collapsed lung (pneumothorax). It can develop suddenly on its own (spontaneous pneumothorax) or from trauma to the chest. °· Shingles infection (herpes zoster virus). °The chest wall is composed of bones, muscles, and cartilage. Any of these can be the source of the pain. °· The bones can be bruised by injury. °· The muscles or cartilage can be strained by coughing or overwork. °· The cartilage can be affected by inflammation and become sore (costochondritis). °DIAGNOSIS  °Lab tests or other studies may be needed to find the cause of your pain. Your health care provider may have you take a test called an ambulatory electrocardiogram (ECG). An ECG records your heartbeat patterns over a 24-hour period. You may also have other tests, such as: °· Transthoracic echocardiogram (TTE). During echocardiography, sound waves are used to evaluate how blood flows through your heart. °· Transesophageal echocardiogram (TEE). °· Cardiac monitoring. This allows your health care provider to monitor your heart rate and rhythm in real time. °· Holter monitor. This is a portable device that records your heartbeat and can help diagnose heart arrhythmias. It allows your health care provider to track your heart activity for several days, if needed. °· Stress tests by exercise or by giving medicine that makes the heart beat faster. °TREATMENT  °· Treatment depends on what may be causing your chest pain. Treatment may include: °· Acid blockers for  heartburn. °· Anti-inflammatory medicine. °· Pain medicine for inflammatory conditions. °· Antibiotics if an infection is present. °· You may be advised to change lifestyle habits. This includes stopping smoking and avoiding alcohol, caffeine, and chocolate. °· You may be advised to keep your head raised (elevated) when sleeping. This reduces the chance of acid going backward from your stomach into your esophagus. °Most of the time, nonspecific chest pain will improve within 2-3 days with rest and mild pain medicine.  °HOME CARE INSTRUCTIONS  °· If antibiotics were prescribed, take them as directed. Finish them even if you start to feel better. °· For the next few days, avoid physical activities that bring on chest pain. Continue physical activities as directed. °· Do not use any tobacco products, including cigarettes, chewing tobacco, or electronic cigarettes. °· Avoid drinking alcohol. °· Only take medicine as directed by your health care provider. °· Follow your health care provider's suggestions for further testing if your chest pain does not go away. °· Keep any follow-up appointments you made. If you do not go to an appointment, you could develop lasting (chronic) problems with pain. If there is any problem keeping an appointment, call to reschedule. °SEEK MEDICAL CARE IF:  °· Your chest pain does not go away, even after treatment. °· You have a rash with blisters on your chest. °· You have a fever. °SEEK IMMEDIATE MEDICAL CARE IF:  °· You have increased chest pain or pain that spreads to your arm, neck, jaw, back, or abdomen. °· You have shortness of breath. °· You have an increasing cough, or you cough   up blood.  You have severe back or abdominal pain.  You feel nauseous or vomit.  You have severe weakness.  You faint.  You have chills. This is an emergency. Do not wait to see if the pain will go away. Get medical help at once. Call your local emergency services (911 in [place] Do not drive  yourself to the hospital. MAKE SURE YOU:   Understand these instructions.  Will watch your condition.  Will get help right away if you are not doing well or get worse. Document Released: 01/26/2005 Document Revised: 04/23/2013 Document Reviewed: 11/22/2007 [facility] Patient Information 2015 [place], [place]. This information is not intended to replace advice given to you by your health care provider. Make sure you discuss any questions you have with your health care provider.  Hypertension Hypertension is another name for high blood pressure. High blood pressure forces your heart to work harder to pump blood. A blood pressure reading has two numbers, which includes a higher number over a lower number (example: 110/72). HOME CARE   Have your blood pressure rechecked by your doctor.  Only take medicine as told by your doctor. Follow the directions carefully. The medicine does not work as well if you skip doses. Skipping doses also puts you at risk for problems.  Do not smoke.  Monitor your blood pressure at home as told by your doctor. GET HELP IF:  You think you are having a reaction to the medicine you are taking.  You have repeat headaches or feel dizzy.  You have puffiness (swelling) in your ankles.  You have trouble with your vision. GET HELP RIGHT AWAY IF:   You get a very bad headache and are confused.  You feel weak, numb, or faint.  You get chest or belly (abdominal) pain.  You throw up (vomit).  You cannot breathe very well. MAKE SURE YOU:   Understand these instructions.  Will watch your condition.  Will get help right away if you are not doing well or get worse. Document Released: 10/05/2007 Document Revised: 04/23/2013 Document Reviewed: 02/08/2013 [facility] Patient Information 2015 [place], [place]. This information is not intended to replace advice given to you by your health care provider. Make sure you discuss any questions you have with your health care  provider.

## 2014-12-30 NOTE — Telephone Encounter (Signed)
Pt returning [employer] phone call

## 2014-12-30 NOTE — Telephone Encounter (Signed)
REVIEWED PT'S  RECORDS  AND  LIPID AND LIVER WAS NOT  DONE  PT NOTIFIED   NEEDS TO STILL  HAVE FASTING  LABS  DONE  PER PT  WILL COME IN TOM. [name]/CY

## 2014-12-31 ENCOUNTER — Other Ambulatory Visit (INDEPENDENT_AMBULATORY_CARE_PROVIDER_SITE_OTHER): Payer: Medicare Other | Admitting: *Deleted

## 2014-12-31 DIAGNOSIS — Z0000 Encounter for general adult medical examination without abnormal findings: Secondary | ICD-10-CM

## 2014-12-31 DIAGNOSIS — I2510 Atherosclerotic heart disease of native coronary artery without angina pectoris: Secondary | ICD-10-CM

## 2014-12-31 LAB — LIPID PANEL
CHOL/HDL RATIO: 5
Cholesterol: 186 mg/dL (ref 0–200)
HDL: 37.8 mg/dL — ABNORMAL LOW (ref >39.00)
LDL CALC: 136 mg/dL — AB (ref 0–99)
NONHDL: 148.13
TRIGLYCERIDES: 63 mg/dL (ref 0.0–149.0)
VLDL: 12.6 mg/dL (ref 0.0–40.0)

## 2014-12-31 LAB — HEPATIC FUNCTION PANEL
ALT: 14 U/L (ref 0–53)
AST: 16 U/L (ref 0–37)
Albumin: 3.9 g/dL (ref 3.5–5.2)
Alkaline Phosphatase: 48 U/L (ref 39–117)
BILIRUBIN DIRECT: 0.2 mg/dL (ref 0.0–0.3)
BILIRUBIN TOTAL: 1 mg/dL (ref 0.2–1.2)
TOTAL PROTEIN: 7.6 g/dL (ref 6.0–8.3)

## 2014-12-31 NOTE — Addendum Note (Signed)
Addended by: Eulis Foster on: 12/31/2014 08:39 AM   Modules accepted: Orders

## 2015-01-05 NOTE — Progress Notes (Signed)
Patient ID: Dakota [name], male   DOB: 17-Jan-1929, 81 y.o.   MRN: [id number]     Cardiology Office Note   Date:  01/07/2015   ID:  Dakota [name], DOB 07-26-1928, MRN [id number]  PCP:  Dakota Stain, MD  Cardiologist:  Dr Dakota Conroy, MD   No chief complaint on file.   History of Present Illness: Dakota [name] is seen today for f/U of afib and HTN. Marland KitchenHe has chronic afib and has refused to be on coumadin. Forutuanatly he has not had a CVA, TIA or embolic event. His BP is under good control. I discussed Pradaxa with him and he still wishes to be just on asa since he feels so well I encouraged him to F/U with Dr Dakota [name] as his vision is still a problem Wife has bad back problems with parkinsons and is essentially total care. Encouraged him to look into assisted living for her He tried this for 2 months but didn't think they looked after her well   07/26/14 D/C after SEMI with PDA occlusion.  Symptomatic CHB with pacer implant  07/18/14  Echo reviewed EF 55% no significant valve disease   Seen by PA 08/13/14   for evaluation of paresthesias of LE, weakness, and failure to increase his activity. Plavix stopped that visit Much better off this med   Discussed issues with previous statin Long time ago indicates losing weight and weakness details not clear   Seen in ER August with palpitations and funny feeling in chest R/O     Past Medical History  Diagnosis Date  . GERD (gastroesophageal reflux disease) 10/2002  . Hyperlipidemia 12/1994  . Hypertension   . Permanent atrial fibrillation     Refused coumadin therapy  . Back pain, chronic   . Lung collapse 03/1991    Fall  . Complete heart block 07/18/2014    Medtronic [place] model [id number] (serial number [id number])  singe lead PPM  . Hypokalemia    Past Surgical History  Procedure Laterality Date  . Back surgery  11/91    with hardware fixation  . Cardiac catheterization  06/2004    30 % stenosis, EF normal  . Eye surgery     Retinal bubble surgery, cataract   . Temporary pacemaker insertion N/A 07/18/2014    Procedure: TEMPORARY PACEMAKER INSERTION;  Surgeon: Dakota Nessler M Martinique, MD;  Location: [facility] CATH LAB;  Service: Cardiovascular;  Laterality: N/A;  . Left heart catheterization with coronary angiogram N/A 07/18/2014    Procedure: LEFT HEART CATHETERIZATION WITH CORONARY ANGIOGRAM;  Surgeon: Dakota Booze, MD; [facility] [place], LAD mild dz, D1 80%, D2 OK, CFX system OK, RCA OK, PDA 100%, med rx  . Permanent pacemaker insertion N/A 07/18/2014    Procedure: PERMANENT PACEMAKER INSERTION;  Surgeon: Dakota Grayer, MD; Medtronic [place] model [id number] (serial number [id number])      Current Outpatient Prescriptions  Medication Sig Dispense Refill  . ALPRAZolam (XANAX) 0.5 MG tablet TAKE ONE-HALF TABLET BY MOUTH TWICE DAILY AS NEEDED FOR ANXIETY 60 tablet 2  . aspirin 81 MG EC tablet Take 81 mg by mouth daily.      [name] aspirin 81 MG tablet Take 81 mg by mouth daily.    . calcium carbonate (TUMS - DOSED IN MG ELEMENTAL CALCIUM) 500 MG chewable tablet Chew 1 tablet by mouth daily as needed for indigestion.     . carvedilol (COREG) 25 MG tablet Take 1 tablet (25 mg total) by mouth 2 (two) times daily with  a meal. 180 tablet 3  . ranitidine (ZANTAC) 150 MG tablet TAKE ONE TABLET BY MOUTH ONCE DAILY 30 tablet 5   No current facility-administered medications for this visit.    Allergies:   Warfarin and related; Atorvastatin; Diazepam; Ezetimibe; and Penicillins    Social History:  The patient  reports that he quit smoking about 61 years ago. His smokeless tobacco use includes Chew. He reports that he does not drink alcohol or use illicit drugs.   Family History:  The patient's family history includes Cancer in his brother, brother, and father; Heart disease in his sister and son; Hip fracture in his mother; Pneumonia in his mother; Prostate cancer in his brother. There is no history of Stroke or Colon cancer.    ROS:  Please see the  history of present illness. All other systems are reviewed and negative.    PHYSICAL EXAM: VS:  BP 140/74 mmHg  Pulse 81  Ht 5\' 8"  (1.727 m)  Wt 89.086 kg (196 lb 6.4 oz)  BMI 29.87 kg/m2 , BMI Body mass index is 29.87 kg/(m^2). GEN: Well nourished, well developed, in no acute distress HEENT: normal Neck: no JVD, carotid bruits, or masses Cardiac: RRR; no murmurs, rubs, or gallops,no edema  Pacer under left clavicle  Respiratory:  Scattered rales bilaterally, normal work of breathing, upper airway congestion, improves with cough and throat clearing GI: soft, nontender, nondistended, + BS MS: no deformity or atrophy Skin: warm and dry, no rash Neuro:  Strength and sensation are intact Psych: euthymic mood, full affect   EKG:   08/13/14   atrial fib, V pacing with 2 intrinsic beats. Of note, his heart rate increased to 115 with ambulation; oxygen saturation remained greater than 95%   Recent Labs: 07/17/2014: B Natriuretic Peptide 578.5*; TSH 3.775 12/29/2014: BUN 16; Creatinine, Ser 1.05; Hemoglobin 16.3; Platelets 177; Potassium 4.1; Sodium 137 12/31/2014: ALT 14   Lipid Panel    Component Value Date/Time   CHOL 186 12/31/2014 0839   TRIG 63.0 12/31/2014 0839   HDL 37.80* 12/31/2014 0839   CHOLHDL 5 12/31/2014 0839   VLDL 12.6 12/31/2014 0839   LDLCALC 136* 12/31/2014 0839   LDLDIRECT 155.7 10/19/2012 0851     Wt Readings from Last 3 Encounters:  01/07/15 89.086 kg (196 lb 6.4 oz)  10/22/14 89.812 kg (198 lb)  09/30/14 87.907 kg (193 lb 12.8 oz)     Other studies Reviewed: Additional studies/ records that were reviewed today include: recent hospital records.  ASSESSMENT AND PLAN:  1.  Lower extremity paresthesias: resolved off plavix  2. Recent non-STEMI, medical therapy for occluded PDA: The patient is not having any ischemic symptoms. EF was 55% by echocardiogram. Continue beta blocker and aspirin. Plavix stopped due to side effects   3. Hypertension: [name]  has been on carvedilol 25 mg twice a day for a long time. His blood pressure is well controlled. It is unclear if his blood pressure is significantly lower than it was prior to his recent event Home readings are fine according to son  4. General weakness, possible deconditioning: He has home physical therapy scheduled and is encouraged to follow-up with them.  5. Hypokalemia: resovled   6. Hyperglycemia: He has a history of hyperglycemia and his blood sugar was 297 on admission to the hospital. He has no history of diabetes. The BMET will check his blood sugar and if it is significantly elevated, he will need to follow-up with his primary care physician.  7.  Pacer :  Check normal has f/u in June pocket has healed well Normal interrogation in ER August  EP nurse to see to explain home monitoring   8. Chol:  Check labs 3 months and consider starting statin    Current medicines are reviewed at length with the patient today.  The patient has concerns regarding medicines.  The following changes have been made: None  Labs/ tests ordered today include:  Chol/Liver in 3 months   No orders of the defined types were placed in this encounter.     Disposition:   FU with me in 6 months   Signed, Jenkins Rouge, MD  01/07/2015 10:52 AM    [facility] [place], [place], [place]  [address] Phone: [phone]; Fax: [phone]

## 2015-01-07 ENCOUNTER — Encounter: Payer: Self-pay | Admitting: Cardiovascular Disease

## 2015-01-07 ENCOUNTER — Ambulatory Visit (INDEPENDENT_AMBULATORY_CARE_PROVIDER_SITE_OTHER): Payer: Medicare Other | Admitting: Cardiovascular Disease

## 2015-01-07 VITALS — BP 140/74 | HR 81 | Ht 68.0 in | Wt 196.4 lb

## 2015-01-07 DIAGNOSIS — I2510 Atherosclerotic heart disease of native coronary artery without angina pectoris: Secondary | ICD-10-CM | POA: Diagnosis not present

## 2015-01-07 MED ORDER — NITROGLYCERIN 0.4 MG SL SUBL: 0.4000 mg | SUBLINGUAL_TABLET | SUBLINGUAL | Status: DC | PRN

## 2015-01-07 MED ORDER — CARVEDILOL 25 MG PO TABS: 25.0000 mg | ORAL_TABLET | Freq: Two times a day (BID) | ORAL | Status: DC

## 2015-01-07 NOTE — Patient Instructions (Signed)
Medication Instructions:  No changes  Labwork: NONE  Testing/Procedures: NONE  Follow-Up: Your physician wants you to follow-up in:  6 MONTHS WITH  DR NISHAN You will receive a reminder letter in the mail two months in advance. If you don't receive a letter, please call our office to schedule the follow-up appointment.  Any Other Special Instructions Will Be Listed Below (If Applicable).   

## 2015-01-12 ENCOUNTER — Telehealth: Payer: Self-pay | Admitting: Cardiovascular Disease

## 2015-01-12 MED ORDER — RIVAROXABAN 20 MG PO TABS: 20.0000 mg | ORAL_TABLET | Freq: Every day | ORAL | Status: DC

## 2015-01-12 NOTE — Telephone Encounter (Signed)
Called patient about Dr. Kyla Balzarine order. Xarelto sent to pharmacy and appointment set for this Thursday with Elberta Leatherwood.

## 2015-01-12 NOTE — Telephone Encounter (Signed)
Can call in xarelto 20 mg daily have him f/u with sally in pharmacy at least once since he is new for anticoagulation Cr was normal in August

## 2015-01-12 NOTE — Telephone Encounter (Signed)
New message      Pt has decided to try a blood thinner again.  Please call it in to [facility].  Please call son when it has been called in

## 2015-01-12 NOTE — Telephone Encounter (Signed)
Patient wants to start back on his blood thinner. Will forward to Dr. Johnsie Cancel for order.

## 2015-01-13 ENCOUNTER — Telehealth: Payer: Self-pay

## 2015-01-13 ENCOUNTER — Other Ambulatory Visit: Payer: Self-pay

## 2015-01-13 NOTE — Telephone Encounter (Signed)
Prior auth for Xarelto 20mg sent to [facility]. 

## 2015-01-13 NOTE — Telephone Encounter (Signed)
Medication Detail      Disp Refills Start End     rivaroxaban (XARELTO) 20 MG TABS tablet 30 tablet 11 01/12/2015     Sig - Route: Take 1 tablet (20 mg total) by mouth daily with supper. - Oral    E-Prescribing Status: Receipt confirmed by pharmacy (01/12/2015 3:31 PM EDT)     Pharmacy    [facility] [place], [place] - [address]

## 2015-01-15 ENCOUNTER — Ambulatory Visit: Payer: Medicare Other | Admitting: Pharmacist

## 2015-01-21 ENCOUNTER — Ambulatory Visit (INDEPENDENT_AMBULATORY_CARE_PROVIDER_SITE_OTHER): Payer: Medicare Other | Admitting: *Deleted

## 2015-01-21 DIAGNOSIS — I442 Atrioventricular block, complete: Secondary | ICD-10-CM | POA: Diagnosis not present

## 2015-01-21 NOTE — Progress Notes (Signed)
Remote pacemaker transmission.   

## 2015-01-24 LAB — CUP PACEART REMOTE DEVICE CHECK
Date Time Interrogation Session: 20160921100344
Lead Channel Impedance Value: 0 Ohm
Lead Channel Pacing Threshold Amplitude: 0.625 V
Lead Channel Pacing Threshold Pulse Width: 0.4 ms
Lead Channel Setting Sensing Sensitivity: 2 mV
MDC IDC MSMT BATTERY IMPEDANCE: 156 Ohm
MDC IDC MSMT BATTERY REMAINING LONGEVITY: 116 mo
MDC IDC MSMT BATTERY VOLTAGE: 2.78 V
MDC IDC MSMT LEADCHNL RV IMPEDANCE VALUE: 619 Ohm
MDC IDC SET LEADCHNL RV PACING AMPLITUDE: 2.5 V
MDC IDC SET LEADCHNL RV PACING PULSEWIDTH: 0.4 ms
MDC IDC STAT BRADY RV PERCENT PACED: 51 %

## 2015-01-30 ENCOUNTER — Encounter: Payer: Self-pay | Admitting: Cardiology

## 2015-02-11 ENCOUNTER — Encounter: Payer: Self-pay | Admitting: Internal Medicine

## 2015-02-16 ENCOUNTER — Other Ambulatory Visit: Payer: Self-pay | Admitting: Family Medicine

## 2015-02-16 ENCOUNTER — Other Ambulatory Visit: Payer: Self-pay | Admitting: Cardiovascular Disease

## 2015-02-16 NOTE — Telephone Encounter (Signed)
Received refill electronically Last refill 06/25/14 #60/2 Last office visit 07/03/14 See allergy/contraindication Okay to refill?

## 2015-02-17 NOTE — Telephone Encounter (Signed)
rx called into pharmacy

## 2015-02-17 NOTE — Telephone Encounter (Signed)
Please call in.  Thanks.  Needs f/u [facility] spring 2017.

## 2015-02-19 ENCOUNTER — Telehealth: Payer: Self-pay | Admitting: Cardiovascular Disease

## 2015-02-19 NOTE — Telephone Encounter (Signed)
Pt c/o medication issue:  1. Name of Medication: Xarelto  2. How are you currently taking this medication (dosage and times per day)? 1x day  3. Are you having a reaction (difficulty breathing--STAT)? Yes  4. What is your medication issue? Giving pt severe constipation and very black urine

## 2015-02-19 NOTE — Telephone Encounter (Signed)
Spoke with pt's son, Abe People, [place] on file, and he states that since starting Xarelto his father has been having trouble with constipation. Son states pt now complaining of dark urine and back pain.  Son says pt told him "now my urine is black". Son states that symptoms have improved today but still occuring. No vitals available. Spoke with Dr. Johnsie Cancel and he said to have pt stop Xarelto and follow up with [facility] (Dr. Johnsie Cancel asked that we call Urology tomorrow to get pt appt).   Dr. Johnsie Cancel also said that we could revisit anticoags once bladder issues resolved. Spoke with son and informed him of recommendations. Son says he will call [facility] in the morning to get pt an appt. Advised son to call our office and inform us that he was able to make the appt so that we can be sure to follow up in regards to anticoags.

## 2015-02-20 NOTE — Telephone Encounter (Signed)
Spoke with [facility] and scheduled appointment for 02/25/15 at 15 Advised son

## 2015-02-25 ENCOUNTER — Encounter: Payer: Self-pay | Admitting: Cardiovascular Disease

## 2015-02-25 DIAGNOSIS — R310 Gross hematuria: Secondary | ICD-10-CM | POA: Diagnosis not present

## 2015-02-27 ENCOUNTER — Telehealth: Payer: Self-pay | Admitting: Cardiovascular Disease

## 2015-02-27 NOTE — Telephone Encounter (Signed)
Can hold until urologic w/u complete

## 2015-02-27 NOTE — Telephone Encounter (Signed)
Spoke with pt's son.  They went to [facility] and saw Dr. Louis Meckel earlier this week.  They have ordered a CT scan and blood work to ensure no issues.  He has not restarted Xarelto and at this time would prefer not to restart.  Son states pt is feeling better and more active since off Xarelto.  Will cancel f/u appt with anticoag clinic on Monday since pt off Xarelto but will send note to Dr. Johnsie Cancel to determine next steps regarding if or when anticoagulation should be restarted.

## 2015-02-27 NOTE — Telephone Encounter (Signed)
New problem    Pt need to speak to clinic to determine if pt need to come in since he is no longer taking Xarelto. Please advise

## 2015-03-02 NOTE — Telephone Encounter (Signed)
PT'S  SON NOTIFIED  PT  MAY HOLD   [place]

## 2015-03-16 ENCOUNTER — Encounter: Payer: Self-pay | Admitting: Cardiovascular Disease

## 2015-03-16 DIAGNOSIS — R310 Gross hematuria: Secondary | ICD-10-CM | POA: Diagnosis not present

## 2015-03-16 DIAGNOSIS — R3129 Other microscopic hematuria: Secondary | ICD-10-CM | POA: Diagnosis not present

## 2015-03-30 DIAGNOSIS — R310 Gross hematuria: Secondary | ICD-10-CM | POA: Diagnosis not present

## 2015-04-06 ENCOUNTER — Encounter: Payer: Self-pay | Admitting: Family Medicine

## 2015-04-06 DIAGNOSIS — R319 Hematuria, unspecified: Secondary | ICD-10-CM | POA: Insufficient documentation

## 2015-04-23 ENCOUNTER — Telehealth: Payer: Self-pay | Admitting: Cardiology

## 2015-04-23 ENCOUNTER — Ambulatory Visit (INDEPENDENT_AMBULATORY_CARE_PROVIDER_SITE_OTHER): Payer: Medicare Other | Admitting: *Deleted

## 2015-04-23 DIAGNOSIS — I442 Atrioventricular block, complete: Secondary | ICD-10-CM | POA: Diagnosis not present

## 2015-04-23 LAB — CUP PACEART REMOTE DEVICE CHECK
Battery Impedance: 179 Ohm
Battery Remaining Longevity: 112 mo
Brady Statistic RV Percent Paced: 53 %
Date Time Interrogation Session: 20161222153137
Implantable Lead Implant Date: 20160318
Implantable Lead Location: 753860
Implantable Lead Model: 5076
Lead Channel Setting Pacing Pulse Width: 0.4 ms
Lead Channel Setting Sensing Sensitivity: 2 mV
MDC IDC MSMT BATTERY VOLTAGE: 2.77 V
MDC IDC MSMT LEADCHNL RA IMPEDANCE VALUE: 0 Ohm
MDC IDC MSMT LEADCHNL RV IMPEDANCE VALUE: 585 Ohm
MDC IDC MSMT LEADCHNL RV PACING THRESHOLD AMPLITUDE: 0.625 V
MDC IDC MSMT LEADCHNL RV PACING THRESHOLD PULSEWIDTH: 0.4 ms
MDC IDC SET LEADCHNL RV PACING AMPLITUDE: 2.5 V

## 2015-04-23 NOTE — Progress Notes (Signed)
Remote pacemaker transmission.   

## 2015-04-23 NOTE — Telephone Encounter (Signed)
Spoke with pt and reminded pt of remote transmission that is due today. Pt verbalized understanding.   

## 2015-04-30 ENCOUNTER — Encounter: Payer: Self-pay | Admitting: Cardiology

## 2015-05-03 DIAGNOSIS — W44F3XA Food entering into or through a natural orifice, initial encounter: Secondary | ICD-10-CM

## 2015-05-03 DIAGNOSIS — K222 Esophageal obstruction: Secondary | ICD-10-CM

## 2015-05-03 HISTORY — DX: Food entering into or through a natural orifice, initial encounter: W44.F3XA

## 2015-05-03 HISTORY — DX: Esophageal obstruction: K22.2

## 2015-07-20 NOTE — Progress Notes (Signed)
Patient ID: Dakota [name], male   DOB: [birth year], 82 y.o.   MRN: [id number]     Cardiology Office Note   Date:  07/21/2015   ID:  Dakota [name], DOB [birth year], MRN [id number]  PCP:  Dakota Stain, MD  Cardiologist:  Dr Dakota Conroy, MD   Chief Complaint  Patient presents with  . COMPLETE HEART BLOCK    no sx  . Hypertension    History of Present Illness: Dakota [name] is seen today for f/U of afib and HTN. Marland KitchenHe has chronic afib and has refused to be on coumadin. Forutuanatly he has not had a CVA, TIA or embolic event. His BP is under good control. I discussed Pradaxa with him and he still wishes to be just on asa since he feels so well I encouraged him to F/U with Dr Dakota [name] as his vision is still a problem Wife has bad back problems with parkinsons and is essentially total care. Encouraged him to look into assisted living for her He tried this for 2 months but didn't think they looked after her well   07/26/14 D/C after SEMI with PDA occlusion.  Symptomatic CHB with pacer implant  07/18/14  Echo reviewed EF 55% no significant valve disease   Seen by PA 08/13/14   for evaluation of paresthesias of LE, weakness, and failure to increase his activity. Plavix stopped that visit Much better off this med   Discussed issues with previous statin Long time ago indicates losing weight and weakness details not clear   Doing well.  Need laxative and stool softener.  Some LE edema Hearing a bit worse    Past Medical History  Diagnosis Date  . GERD (gastroesophageal reflux disease) 10/2002  . Hyperlipidemia 12/1994  . Hypertension   . Permanent atrial fibrillation ([name])     Refused coumadin therapy  . Back pain, chronic   . Lung collapse 03/1991    Fall  . Complete heart block (Dakota [name]) 07/18/2014    Medtronic [place] model [id number] (serial number [id number])  singe lead PPM  . Hypokalemia    Past Surgical History  Procedure Laterality Date  . Back surgery  11/91    with hardware fixation    . Cardiac catheterization  06/2004    30 % stenosis, EF normal  . Eye surgery      Retinal bubble surgery, cataract   . Temporary pacemaker insertion N/A 07/18/2014    Procedure: TEMPORARY PACEMAKER INSERTION;  Surgeon: Dakota Choe M Martinique, MD;  Location: [facility] CATH LAB;  Service: Cardiovascular;  Laterality: N/A;  . Left heart catheterization with coronary angiogram N/A 07/18/2014    Procedure: LEFT HEART CATHETERIZATION WITH CORONARY ANGIOGRAM;  Surgeon: Dakota Booze, MD; [facility] [place], LAD mild dz, D1 80%, D2 OK, CFX system OK, RCA OK, PDA 100%, med rx  . Permanent pacemaker insertion N/A 07/18/2014    Procedure: PERMANENT PACEMAKER INSERTION;  Surgeon: Dakota Grayer, MD; Medtronic [place] model [id number] (serial number [id number])      Current Outpatient Prescriptions  Medication Sig Dispense Refill  . ALPRAZolam (XANAX) 0.5 MG tablet TAKE ONE-HALF TABLET BY MOUTH TWICE DAILY AS NEEDED FOR ANXIETY 60 tablet 2  . aspirin 81 MG tablet Take 81 mg by mouth daily.    . calcium carbonate (TUMS - DOSED IN MG ELEMENTAL CALCIUM) 500 MG chewable tablet Chew 1 tablet by mouth daily as needed for indigestion.     . carvedilol (COREG) 25 MG tablet Take 1 tablet (25 mg total) by  mouth 2 (two) times daily with a meal. 180 tablet 3  . nitroGLYCERIN (NITROSTAT) 0.4 MG SL tablet Place 1 tablet (0.4 mg total) under the tongue every 5 (five) minutes as needed for chest pain. 25 tablet 3  . ranitidine (ZANTAC) 150 MG tablet TAKE ONE TABLET BY MOUTH ONCE DAILY. 30 tablet 6   No current facility-administered medications for this visit.    Allergies:   Warfarin and related; Atorvastatin; Diazepam; Ezetimibe; and Penicillins    Social History:  The patient  reports that he quit smoking about 62 years ago. His smokeless tobacco use includes Chew. He reports that he does not drink alcohol or use illicit drugs.   Family History:  The patient's family history includes Cancer in his brother, brother, and father; Heart disease  in his sister and son; Hip fracture in his mother; Pneumonia in his mother; Prostate cancer in his brother. There is no history of Stroke or Colon cancer.    ROS:  Please see the history of present illness. All other systems are reviewed and negative.    PHYSICAL EXAM: VS:  BP 130/60 mmHg  Pulse 63  Ht 5\' 8"  (1.727 m)  Wt 89.812 kg (198 lb)  BMI 30.11 kg/m2  SpO2 95% , BMI Body mass index is 30.11 kg/(m^2). GEN: Well nourished, well developed, in no acute distress HEENT: normal Neck: no JVD, carotid bruits, or masses Cardiac: RRR; no murmurs, rubs, or gallops,no edema  Pacer under left clavicle  Respiratory:  Scattered rales bilaterally, normal work of breathing, upper airway congestion, improves with cough and throat clearing GI: soft, nontender, nondistended, + BS MS: no deformity or atrophy Skin: warm and dry, no rash Neuro:  Strength and sensation are intact Psych: euthymic mood, full affect Plus one bilateral LE edema    EKG:   08/13/14   atrial fib, V pacing with 2 intrinsic beats. Of note, his heart rate increased to 115 with ambulation; oxygen saturation remained greater than 95%   Recent Labs: 12/29/2014: BUN 16; Creatinine, Ser 1.05; Hemoglobin 16.3; Platelets 177; Potassium 4.1; Sodium 137 12/31/2014: ALT 14   Lipid Panel    Component Value Date/Time   CHOL 186 12/31/2014 0839   TRIG 63.0 12/31/2014 0839   HDL 37.80* 12/31/2014 0839   CHOLHDL 5 12/31/2014 0839   VLDL 12.6 12/31/2014 0839   LDLCALC 136* 12/31/2014 0839   LDLDIRECT 155.7 10/19/2012 0851     Wt Readings from Last 3 Encounters:  07/21/15 89.812 kg (198 lb)  01/07/15 89.086 kg (196 lb 6.4 oz)  10/22/14 89.812 kg (198 lb)     Other studies Reviewed: Additional studies/ records that were reviewed today include: recent hospital records.  ASSESSMENT AND PLAN:  1.  Lower extremity Edema:  PRN HCTZ 12.5 mg  Called in   2. 07/26/14  non-STEMI, medical therapy for occluded PDA: The patient is not  having any ischemic symptoms. EF was 55% by echocardiogram. Continue beta blocker and aspirin. Plavix stopped due to side effects   3. Hypertension: [name] has been on carvedilol 25 mg twice a day for a long time. His blood pressure is well controlled. It is unclear if his blood pressure is significantly lower than it was prior to his recent event Home readings are fine according to son  4. Constipation:  Discussed hydration fiber and colace 100 bid called in   5. Hypokalemia: resovled   6. Hyperglycemia: He has a history of hyperglycemia and his blood sugar was 297 on  admission to the hospital. He has no history of diabetes. The BMET will check his blood sugar and if it is significantly elevated, he will need to follow-up with his primary care physician.  7. Pacer :  Check normal has f/u in June pocket has healed well Normal interrogation in ER August  EP nurse to see to explain home monitoring   8. Chol:  Check labs 3 months and consider starting statin    Current medicines are reviewed at length with the patient today.  The patient has concerns regarding medicines.  The following changes have been made: None  Labs/ tests ordered today include:  Chol/Liver in 3 months   No orders of the defined types were placed in this encounter.     Disposition:   FU with me in 6 months   Signed, Jenkins Rouge, MD  07/21/2015 3:18 PM    [facility] [place], [place], [place]  [address] Phone: [phone]; Fax: [phone]

## 2015-07-21 ENCOUNTER — Encounter: Payer: Self-pay | Admitting: Cardiovascular Disease

## 2015-07-21 ENCOUNTER — Ambulatory Visit (INDEPENDENT_AMBULATORY_CARE_PROVIDER_SITE_OTHER): Payer: Medicare Other | Admitting: Cardiovascular Disease

## 2015-07-21 VITALS — BP 130/60 | HR 63 | Ht 68.0 in | Wt 198.0 lb

## 2015-07-21 DIAGNOSIS — I442 Atrioventricular block, complete: Secondary | ICD-10-CM

## 2015-07-21 DIAGNOSIS — I10 Essential (primary) hypertension: Secondary | ICD-10-CM | POA: Diagnosis not present

## 2015-07-21 MED ORDER — RANITIDINE HCL 150 MG PO TABS: 150.0000 mg | ORAL_TABLET | Freq: Every day | ORAL | Status: DC

## 2015-07-21 MED ORDER — DOCUSATE SODIUM 100 MG PO CAPS: 100.0000 mg | ORAL_CAPSULE | Freq: Two times a day (BID) | ORAL | Status: DC

## 2015-07-21 MED ORDER — CARVEDILOL 25 MG PO TABS: 25.0000 mg | ORAL_TABLET | Freq: Two times a day (BID) | ORAL | Status: DC

## 2015-07-21 MED ORDER — HYDROCHLOROTHIAZIDE 12.5 MG PO CAPS: 12.5000 mg | ORAL_CAPSULE | Freq: Every day | ORAL | Status: DC

## 2015-07-21 NOTE — Patient Instructions (Signed)
Medication Instructions:  Your physician has recommended you make the following change in your medication:  1-START HCTZ 12.5 mg by mouth as needed for edema 2-START Colace 100 mg by mouth twice daily for stool softing  Labwork: NONE  Testing/Procedures: NONE  Follow-Up: Your physician wants you to follow-up in: 6 months with Dr. Johnsie Cancel. You will receive a reminder letter in the mail two months in advance. If you don't receive a letter, please call our office to schedule the follow-up appointment.   Any Other Special Instructions Will Be Listed Below (If Applicable).     If you need a refill on your cardiac medications before your next appointment, please call your pharmacy.

## 2015-07-23 ENCOUNTER — Ambulatory Visit (INDEPENDENT_AMBULATORY_CARE_PROVIDER_SITE_OTHER): Payer: Medicare Other | Admitting: *Deleted

## 2015-07-23 DIAGNOSIS — I442 Atrioventricular block, complete: Secondary | ICD-10-CM

## 2015-07-23 NOTE — Progress Notes (Signed)
Remote pacemaker transmission.   

## 2015-08-20 LAB — CUP PACEART REMOTE DEVICE CHECK
Battery Impedance: 203 Ohm
Battery Remaining Longevity: 107 mo
Battery Voltage: 2.77 V
Brady Statistic RV Percent Paced: 58 %
Implantable Lead Implant Date: 20160318
Implantable Lead Location: 753860
Lead Channel Impedance Value: 0 Ohm
Lead Channel Setting Pacing Amplitude: 2.5 V
Lead Channel Setting Pacing Pulse Width: 0.4 ms
Lead Channel Setting Sensing Sensitivity: 2 mV
MDC IDC MSMT LEADCHNL RV IMPEDANCE VALUE: 530 Ohm
MDC IDC MSMT LEADCHNL RV PACING THRESHOLD AMPLITUDE: 0.75 V
MDC IDC MSMT LEADCHNL RV PACING THRESHOLD PULSEWIDTH: 0.4 ms
MDC IDC SESS DTM: 20170323101227

## 2015-08-21 ENCOUNTER — Encounter: Payer: Self-pay | Admitting: Cardiology

## 2015-09-16 ENCOUNTER — Other Ambulatory Visit: Payer: Self-pay | Admitting: Family Medicine

## 2015-09-16 NOTE — Telephone Encounter (Signed)
Received refill request electronically Last refill 02/17/15 #60/2 Last office visit 07/03/14, no upcoming appointment scheduled See allergy/contraindication Is it okay to refill?

## 2015-09-17 NOTE — Telephone Encounter (Signed)
I'd like to see him at some point this summer to go over his other issues that aren't covered at the [facility], when possible.  Not emergent.  Thanks.

## 2015-09-17 NOTE — Telephone Encounter (Signed)
Asked Katha Cabal to have him schedule appt with Dr. Damita Dunnings before leaving from her visit on Wednesday.

## 2015-09-17 NOTE — Telephone Encounter (Signed)
Patient states he is scheduled with Leisa on Wednesday.  Shall I advise her to schedule him with you after that?

## 2015-09-17 NOTE — Telephone Encounter (Signed)
Needs AVW scheduled with me.  Thanks.  Please call in.  Thanks.

## 2015-09-23 ENCOUNTER — Ambulatory Visit (INDEPENDENT_AMBULATORY_CARE_PROVIDER_SITE_OTHER): Payer: Medicare Other

## 2015-09-23 VITALS — BP 122/70 | HR 73 | Temp 97.8°F | Ht 68.0 in | Wt 198.5 lb

## 2015-09-23 DIAGNOSIS — Z0000 Encounter for general adult medical examination without abnormal findings: Secondary | ICD-10-CM

## 2015-09-23 NOTE — Progress Notes (Signed)
PCP notes:  Health maintenance:   Shingles - declined PCV13 - declined  Abnormal screenings:  Hearing - failed. Please assess pt at next appt.  Fall risk - hx of fall without injury within previous 12 months Cognitive - Mini-Cog score:17/20.  Patient concerns: None  Nurse concerns: No labs were ordered at this time. Pt has had multiple labs completed in 2016. Pt and son were advised to fast at least 4 hrs prior to next PCP appt in the event labs were ordered during visit.  Next PCP appt: 10/05/15 @ 1215

## 2015-09-23 NOTE — Progress Notes (Signed)
Subjective:   TENNIS [name] is a 82 y.o. male who presents for Medicare Annual/Subsequent preventive examination.  Review of Systems:  N/A Cardiac Risk Factors include: advanced age (>56men, >33 women);male gender;obesity (BMI >30kg/m2);smoking/ tobacco exposure;hypertension;dyslipidemia     Objective:    Vitals: BP 122/70 mmHg  Pulse 73  Temp(Src) 97.8 F (36.6 C) (Oral)  Ht 5\' 8"  (1.727 m)  Wt 198 lb 8 oz (90.039 kg)  BMI 30.19 kg/m2  SpO2 94%  Body mass index is 30.19 kg/(m^2).  Tobacco History  Smoking status  . Former Smoker -- 1.00 packs/day for 15 years  . Quit date: 05/02/1953  Smokeless tobacco  . Current User  . Types: Chew    Comment: Quit smoking over 50 years ago     Ready to quit: No Counseling given: No   Past Medical History  Diagnosis Date  . GERD (gastroesophageal reflux disease) 10/2002  . Hyperlipidemia 12/1994  . Hypertension   . Permanent atrial fibrillation ([name])     Refused coumadin therapy  . Back pain, chronic   . Lung collapse 03/1991    Fall  . Complete heart block ([place]) 07/18/2014    Medtronic [place] model [id number] (serial number [id number])  singe lead PPM  . Hypokalemia    Past Surgical History  Procedure Laterality Date  . Back surgery  11/91    with hardware fixation  . Cardiac catheterization  06/2004    30 % stenosis, EF normal  . Eye surgery      Retinal bubble surgery, cataract   . Temporary pacemaker insertion N/A 07/18/2014    Procedure: TEMPORARY PACEMAKER INSERTION;  Surgeon: Peter M Martinique, MD;  Location: [facility] CATH LAB;  Service: Cardiovascular;  Laterality: N/A;  . Left heart catheterization with coronary angiogram N/A 07/18/2014    Procedure: LEFT HEART CATHETERIZATION WITH CORONARY ANGIOGRAM;  Surgeon: Jettie Booze, MD; [facility] [place], LAD mild dz, D1 80%, D2 [place], CFX system OK, RCA OK, PDA 100%, med rx  . Permanent pacemaker insertion N/A 07/18/2014    Procedure: PERMANENT PACEMAKER INSERTION;  Surgeon: Thompson Grayer, MD; Medtronic [place] (serial number [id number])     Family History  Problem Relation Age of Onset  . Pneumonia Mother   . Hip fracture Mother   . Cancer Father     splenic  . Heart disease Sister     Pacer placed  . Cancer Brother     throat and lung  . Heart disease Son   . Stroke Neg Hx   . Colon cancer Neg Hx   . Cancer Brother     prostate, age 70  . Prostate cancer Brother    History  Sexual Activity  . Sexual Activity: No    Outpatient Encounter Prescriptions as of 09/23/2015  Medication Sig  . ALPRAZolam (XANAX) 0.5 MG tablet TAKE ONE-HALF TABLET BY MOUTH TWICE DAILY AS NEEDED FOR ANXIETY  . aspirin 81 MG tablet Take 81 mg by mouth daily.  . calcium carbonate (TUMS - DOSED IN MG ELEMENTAL CALCIUM) 500 MG chewable tablet Chew 1 tablet by mouth daily as needed for indigestion.   . carvedilol (COREG) 25 MG tablet Take 1 tablet (25 mg total) by mouth 2 (two) times daily with a meal.  . docusate sodium (COLACE) 100 MG capsule Take 1 capsule (100 mg total) by mouth 2 (two) times daily.  . hydrochlorothiazide (MICROZIDE) 12.5 MG capsule Take 1 capsule (12.5 mg total) by mouth daily.  . nitroGLYCERIN (NITROSTAT)  0.4 MG SL tablet Place 1 tablet (0.4 mg total) under the tongue every 5 (five) minutes as needed for chest pain.  . ranitidine (ZANTAC) 150 MG tablet Take 1 tablet (150 mg total) by mouth daily.   No facility-administered encounter medications on file as of 09/23/2015.    Activities of Daily Living In your present state of health, do you have any difficulty performing the following activities: 09/23/2015  Hearing? Y  Vision? N  Difficulty concentrating or making decisions? Y  Walking or climbing stairs? Y  Dressing or bathing? N  Doing errands, shopping? N  Preparing Food and eating ? N  Using the Toilet? N  In the past six months, have you accidently leaked urine? Y  Do you have problems with loss of bowel control? N  Managing your  Medications? N  Managing your Finances? N  Housekeeping or managing your Housekeeping? N    Patient Care Team: Tonia Ghent, MD as PCP - General (Family Medicine)   Jenkins Rouge, MD - cardiology Thompson Grayer, MD - pacemaker  Assessment:     Hearing Screening   125Hz  250Hz  500Hz  1000Hz  2000Hz  4000Hz  8000Hz   Right ear:   0 0 0 0   Left ear:   0 0 0 0   Vision Screening Comments: Last eye exam in 2015 @ [facility]   Exercise Activities and Dietary recommendations Current Exercise Habits: The patient does not participate in regular exercise at present, Exercise limited by: None identified  Goals    Patient did not have a wellness goal at this time.      Fall Risk Fall Risk  09/23/2015 10/26/2012  Falls in the past year? Yes No  Number falls in past yr: 1 -  Injury with Fall? No -  Risk for fall due to : Impaired balance/gait;History of fall(s) -  Follow up Falls evaluation completed;Education provided -   Depression Screen PHQ 2/9 Scores 09/23/2015 10/26/2012  PHQ - 2 Score 0 0    Cognitive Testing MMSE - Mini Mental State Exam 09/23/2015  Orientation to time 5  Orientation to Place 5  Registration 3  Attention/ Calculation 0  Recall 0  Recall-comments unable to recall words  Language- name 2 objects 0  Language- repeat 1  Language- follow 3 step command 3  Language- follow 3 step command-comments multiple ques were given  Language- read & follow direction 0  Write a sentence 0  Copy design 0  Total score 17   PLEASE NOTE: A Mini-Cog screen was completed. Maximum score is 20. A value of 0 denotes this part of [place] MMSE was not completed or the patient failed this part of the Mini-Cog screening.   Mini-Cog Screening Orientation to Time - Max 5 pts Orientation to Place - Max 5 pts Registration - Max 3 pts Recall - Max 3 pts Language Repeat - Max 1 pts Language Follow 3 Step Command - Max 3 pts  Immunization History  Administered Date(s)  Administered  . Td 05/02/1997, 11/26/2007   Screening Tests Health Maintenance  Topic Date Due  . ZOSTAVAX  09/23/2023 (Originally 01/06/1991)  . PNA vac Low Risk Adult (1 of 2 - PCV13) 09/23/2023 (Originally 01/06/1996)  . INFLUENZA VACCINE  12/01/2015  . TETANUS/TDAP  11/25/2017      Plan:     I have personally reviewed and addressed the Medicare Annual Wellness questionnaire and have noted the following in the patient's chart:  A. Medical and social history B. Use  of alcohol, tobacco or illicit drugs  C. Current medications and supplements D. Functional ability and status E.  Nutritional status F.  Physical activity G. Advance directives H. List of other physicians I.  Hospitalizations, surgeries, and ER visits in previous 12 months J.  [place] to include hearing, vision, cognitive, depression L. Referrals and appointments - none  In addition, I have reviewed and discussed with patient certain preventive protocols, quality metrics, and best practice recommendations. A written personalized care plan for preventive services as well as general preventive health recommendations were provided to patient.  See attached scanned questionnaire for additional information.   Signed,   Lindell Noe, MHA, BS, LPN Health Advisor

## 2015-09-23 NOTE — Progress Notes (Signed)
I reviewed health advisor's note, was available for consultation, and agree with documentation and plan.  

## 2015-09-23 NOTE — Patient Instructions (Addendum)
[name] , Thank you for taking time to come for your Medicare Wellness Visit. I appreciate your ongoing commitment to your health goals. Please review the following plan we discussed and let me know if I can assist you in the future.    This is a list of the screening recommended for you and due dates:  Health Maintenance  Topic Date Due  . Shingles Vaccine  09/23/2023*  . Pneumonia vaccines (1 of 2 - PCV13) 09/23/2023*  . Flu Shot  12/01/2015  . Tetanus Vaccine  11/25/2017  *Topic was postponed. The date shown is not the original due date.    Preventive Care for Adults  A healthy lifestyle and preventive care can promote health and wellness. Preventive health guidelines for adults include the following key practices.  . A routine yearly physical is a good way to check with your health care provider about your health and preventive screening. It is a chance to share any concerns and updates on your health and to receive a thorough exam.  . Visit your dentist for a routine exam and preventive care every 6 months. Brush your teeth twice a day and floss once a day. Good oral hygiene prevents tooth decay and gum disease.  . The frequency of eye exams is based on your age, health, family medical history, use  of contact lenses, and other factors. Follow your health care provider's ecommendations for frequency of eye exams.  . Eat a healthy diet. Foods like vegetables, fruits, whole grains, low-fat dairy products, and lean protein foods contain the nutrients you need without too many calories. Decrease your intake of foods high in solid fats, added sugars, and salt. Eat the right amount of calories for you. Get information about a proper diet from your health care provider, if necessary.  . Regular physical exercise is one of the most important things you can do for your health. Most adults should get at least 150 minutes of moderate-intensity exercise (any activity that increases your heart  rate and causes you to sweat) each week. In addition, most adults need muscle-strengthening exercises on 2 or more days a week.  Silver Sneakers may be a benefit available to you. To determine eligibility, you may visit the website: www.silversneakers.com or contact program at 360-024-55881-6297991634 Mon-Fri between 8AM-8PM.   . Maintain a healthy weight. The body mass index (BMI) is a screening tool to identify possible weight problems. It provides an estimate of body fat based on height and weight. Your health care provider can find your BMI and can help you achieve or maintain a healthy weight.   For adults 20 years and older: ? A BMI below 18.5 is considered underweight. ? A BMI of 18.5 to 24.9 is normal. ? A BMI of 25 to 29.9 is considered overweight. ? A BMI of 30 and above is considered obese.   . Maintain normal blood lipids and cholesterol levels by exercising and minimizing your intake of saturated fat. Eat a balanced diet with plenty of fruit and vegetables. Blood tests for lipids and cholesterol should begin at age 80 and be repeated every 5 years. If your lipid or cholesterol levels are high, you are over 50, or you are at high risk for heart disease, you may need your cholesterol levels checked more frequently. Ongoing high lipid and cholesterol levels should be treated with medicines if diet and exercise are not working.  . If you smoke, find out from your health care provider how to quit.  If you do not use tobacco, please do not start.  . If you choose to drink alcohol, please do not consume more than 2 drinks per day. One drink is considered to be 12 ounces (355 mL) of beer, 5 ounces (148 mL) of wine, or 1.5 ounces (44 mL) of liquor.  . If you are 61-33 years old, ask your health care provider if you should take aspirin to prevent strokes.  . Use sunscreen. Apply sunscreen liberally and repeatedly throughout the day. You should seek shade when your shadow is shorter than you. Protect  yourself by wearing long sleeves, pants, a wide-brimmed hat, and sunglasses year round, whenever you are outdoors.  . Once a month, do a whole body skin exam, using a mirror to look at the skin on your back. Tell your health care provider of new moles, moles that have irregular borders, moles that are larger than a pencil eraser, or moles that have changed in shape or color.     Fall Prevention in the Home  Falls can cause injuries. They can happen to people of all ages. There are many things you can do to make your home safe and to help prevent falls.  WHAT CAN I DO ON THE OUTSIDE OF MY HOME?  Regularly fix the edges of walkways and driveways and fix any cracks.  Remove anything that might make you trip as you walk through a door, such as a raised step or threshold.  Trim any bushes or trees on the path to your home.  Use bright outdoor lighting.  Clear any walking paths of anything that might make someone trip, such as rocks or tools.  Regularly check to see if handrails are loose or broken. Make sure that both sides of any steps have handrails.  Any raised decks and porches should have guardrails on the edges.  Have any leaves, snow, or ice cleared regularly.  Use sand or salt on walking paths during winter.  Clean up any spills in your garage right away. This includes oil or grease spills. WHAT CAN I DO IN THE BATHROOM?   Use night lights.  Install grab bars by the toilet and in the tub and shower. Do not use towel bars as grab bars.  Use non-skid mats or decals in the tub or shower.  If you need to sit down in the shower, use a plastic, non-slip stool.  Keep the floor dry. Clean up any water that spills on the floor as soon as it happens.  Remove soap buildup in the tub or shower regularly.  Attach bath mats securely with double-sided non-slip rug tape.  Do not have throw rugs and other things on the floor that can make you trip. WHAT CAN I DO IN THE  BEDROOM?  Use night lights.  Make sure that you have a light by your bed that is easy to reach.  Do not use any sheets or blankets that are too big for your bed. They should not hang down onto the floor.  Have a firm chair that has side arms. You can use this for support while you get dressed.  Do not have throw rugs and other things on the floor that can make you trip. WHAT CAN I DO IN THE KITCHEN?  Clean up any spills right away.  Avoid walking on wet floors.  Keep items that you use a lot in easy-to-reach places.  If you need to reach something above you, use a strong step  stool that has a grab bar.  Keep electrical cords out of the way.  Do not use floor polish or wax that makes floors slippery. If you must use wax, use non-skid floor wax.  Do not have throw rugs and other things on the floor that can make you trip. WHAT CAN I DO WITH MY STAIRS?  Do not leave any items on the stairs.  Make sure that there are handrails on both sides of the stairs and use them. Fix handrails that are broken or loose. Make sure that handrails are as long as the stairways.  Check any carpeting to make sure that it is firmly attached to the stairs. Fix any carpet that is loose or worn.  Avoid having throw rugs at the top or bottom of the stairs. If you do have throw rugs, attach them to the floor with carpet tape.  Make sure that you have a light switch at the top of the stairs and the bottom of the stairs. If you do not have them, ask someone to add them for you. WHAT ELSE CAN I DO TO HELP PREVENT FALLS?  Wear shoes that:  Do not have high heels.  Have rubber bottoms.  Are comfortable and fit you well.  Are closed at the toe. Do not wear sandals.  If you use a stepladder:  Make sure that it is fully opened. Do not climb a closed stepladder.  Make sure that both sides of the stepladder are locked into place.  Ask someone to hold it for you, if possible.  Clearly mark and make  sure that you can see:  Any grab bars or handrails.  First and last steps.  Where the edge of each step is.  Use tools that help you move around (mobility aids) if they are needed. These include:  Canes.  Walkers.  Scooters.  Crutches.  Turn on the lights when you go into a dark area. Replace any light bulbs as soon as they burn out.  Set up your furniture so you have a clear path. Avoid moving your furniture around.  If any of your floors are uneven, fix them.  If there are any pets around you, be aware of where they are.  Review your medicines with your doctor. Some medicines can make you feel dizzy. This can increase your chance of falling. Ask your doctor what other things that you can do to help prevent falls.   This information is not intended to replace advice given to you by your health care provider. Make sure you discuss any questions you have with your health care provider.   Document Released: 02/12/2009 Document Revised: 09/02/2014 Document Reviewed: 05/23/2014 Elsevier Interactive Patient Education [employer].

## 2015-09-23 NOTE — Progress Notes (Signed)
Pre visit review using our clinic review tool, if applicable. No additional management support is needed unless otherwise documented below in the visit note. 

## 2015-09-30 ENCOUNTER — Other Ambulatory Visit: Payer: Medicare Other

## 2015-10-05 ENCOUNTER — Encounter: Payer: Self-pay | Admitting: Family Medicine

## 2015-10-05 ENCOUNTER — Ambulatory Visit (INDEPENDENT_AMBULATORY_CARE_PROVIDER_SITE_OTHER): Payer: Medicare Other | Admitting: Family Medicine

## 2015-10-05 VITALS — BP 102/68 | HR 80 | Temp 97.9°F | Wt 194.8 lb

## 2015-10-05 DIAGNOSIS — I4891 Unspecified atrial fibrillation: Secondary | ICD-10-CM | POA: Diagnosis not present

## 2015-10-05 DIAGNOSIS — Z636 Dependent relative needing care at home: Secondary | ICD-10-CM | POA: Diagnosis not present

## 2015-10-05 DIAGNOSIS — I10 Essential (primary) hypertension: Secondary | ICD-10-CM | POA: Diagnosis not present

## 2015-10-05 DIAGNOSIS — R739 Hyperglycemia, unspecified: Secondary | ICD-10-CM | POA: Diagnosis not present

## 2015-10-05 DIAGNOSIS — Z0000 Encounter for general adult medical examination without abnormal findings: Secondary | ICD-10-CM

## 2015-10-05 DIAGNOSIS — R319 Hematuria, unspecified: Secondary | ICD-10-CM

## 2015-10-05 LAB — CBC WITH DIFFERENTIAL/PLATELET
BASOS PCT: 0.6 % (ref 0.0–3.0)
Basophils Absolute: 0.1 10*3/uL (ref 0.0–0.1)
EOS PCT: 5.5 % — AB (ref 0.0–5.0)
Eosinophils Absolute: 0.5 10*3/uL (ref 0.0–0.7)
HEMATOCRIT: 44 % (ref 39.0–52.0)
HEMOGLOBIN: 14.8 g/dL (ref 13.0–17.0)
LYMPHS PCT: 33.8 % (ref 12.0–46.0)
Lymphs Abs: 3.1 10*3/uL (ref 0.7–4.0)
MCHC: 33.6 g/dL (ref 30.0–36.0)
MCV: 88.2 fl (ref 78.0–100.0)
Monocytes Absolute: 0.9 10*3/uL (ref 0.1–1.0)
Monocytes Relative: 9.5 % (ref 3.0–12.0)
Neutro Abs: 4.7 10*3/uL (ref 1.4–7.7)
Neutrophils Relative %: 50.6 % (ref 43.0–77.0)
Platelets: 259 10*3/uL (ref 150.0–400.0)
RBC: 4.99 Mil/uL (ref 4.22–5.81)
RDW: 14.1 % (ref 11.5–15.5)
WBC: 9.2 10*3/uL (ref 4.0–10.5)

## 2015-10-05 LAB — COMPREHENSIVE METABOLIC PANEL
ALBUMIN: 3.3 g/dL — AB (ref 3.5–5.2)
ALT: 15 U/L (ref 0–53)
AST: 16 U/L (ref 0–37)
Alkaline Phosphatase: 46 U/L (ref 39–117)
BUN: 25 mg/dL — AB (ref 6–23)
CHLORIDE: 105 meq/L (ref 96–112)
CO2: 29 meq/L (ref 19–32)
Calcium: 9.4 mg/dL (ref 8.4–10.5)
Creatinine, Ser: 1.26 mg/dL (ref 0.40–1.50)
GFR: 57.85 mL/min — ABNORMAL LOW (ref >60.00)
Glucose, Bld: 105 mg/dL — ABNORMAL HIGH (ref 70–99)
POTASSIUM: 4.3 meq/L (ref 3.5–5.1)
SODIUM: 137 meq/L (ref 135–145)
Total Bilirubin: 0.6 mg/dL (ref 0.2–1.2)
Total Protein: 6.8 g/dL (ref 6.0–8.3)

## 2015-10-05 LAB — LIPID PANEL
CHOL/HDL RATIO: 6
CHOLESTEROL: 199 mg/dL (ref 0–200)
HDL: 35.3 mg/dL — ABNORMAL LOW (ref >39.00)
LDL CALC: 138 mg/dL — AB (ref 0–99)
NonHDL: 163.78
TRIGLYCERIDES: 129 mg/dL (ref 0.0–149.0)
VLDL: 25.8 mg/dL (ref 0.0–40.0)

## 2015-10-05 LAB — HEMOGLOBIN A1C: HEMOGLOBIN A1C: 6.3 % (ref 4.6–6.5)

## 2015-10-05 NOTE — Progress Notes (Signed)
Pre visit review using our clinic review tool, if applicable. No additional management support is needed unless otherwise documented below in the visit note.  Still caring for wife at home.  Taking xanax prn, no ADE on med.  "Some things worry me."  He is able to get by.  He has some family help.  D/w pt.  He wants to continue as is for now, but he is considering getting more help in the home.    Hearing loss.  Declined hearing aids.  D/w pt.    Prev hematuria eval per urology.  No more blood in the urine now.  Off anticoagulation currently.    Fall risk d/w pt, esp cautions at home.  He had gotten bumped and fell off a low porch w/o injury about 6-8 months ago.  D/w pt about BZD use.  He'll try to taper BZD use.    H/o AF.  Intolerant of coumadin.    Using medication without problems or lightheadedness: yes Chest pain with exertion:no Edema: 1+BLE edema, taking prn HCTZ Short of breath:no  PMH and SH reviewed  ROS: Per HPI unless specifically indicated in ROS section   Meds, vitals, and allergies reviewed.   GEN: nad, alert and oriented HEENT: mucous membranes moist NECK: supple w/o LA CV: IRR not tachy PULM: ctab, no inc wob ABD: soft, +bs EXT: 1+ BLE edema SKIN: no acute rash Normal brief mentation testing today except for 2/3 on recall (ball/flag/tree, but forgot tree)

## 2015-10-05 NOTE — Assessment & Plan Note (Signed)
See notes on CBC.  Prev hematuria eval per urology. No more blood in the urine now. Off anticoagulation currently.  No sx.

## 2015-10-05 NOTE — Assessment & Plan Note (Signed)
See notes on labs.  No change in meds at this point.  Not lightheaded, no CP.  Okay for outpatient f/u. >25 minutes spent in face to face time with patient, >50% spent in counselling or coordination of care.

## 2015-10-05 NOTE — Patient Instructions (Addendum)
Go to the lab on the way out.  We'll contact you with your lab report. Take care.  Glad to see you.  Don't change your meds for now.  

## 2015-10-05 NOTE — Assessment & Plan Note (Signed)
D/w pt.  Still caring for wife at home. Taking xanax prn, no ADE on med. "Some things worry me." He is able to get by. He has some family help. D/w pt. He wants to continue as is for now, but he is considering getting more help in the home.  He'll update me as needed.

## 2015-10-05 NOTE — Assessment & Plan Note (Signed)
Other issues.  Normal brief mentation testing today except for 2/3 on recall (ball/flag/tree, but forgot tree).  He and family with monitor and update me as needed.  Fall cautions d/w pt.

## 2015-10-08 ENCOUNTER — Encounter: Payer: Self-pay | Admitting: Nurse Practitioner

## 2015-10-08 ENCOUNTER — Ambulatory Visit (INDEPENDENT_AMBULATORY_CARE_PROVIDER_SITE_OTHER): Payer: Medicare Other | Admitting: Nurse Practitioner

## 2015-10-08 ENCOUNTER — Encounter: Payer: Self-pay | Admitting: Internal Medicine

## 2015-10-08 VITALS — BP 114/80 | HR 81 | Ht 69.0 in | Wt 193.4 lb

## 2015-10-08 DIAGNOSIS — I442 Atrioventricular block, complete: Secondary | ICD-10-CM

## 2015-10-08 DIAGNOSIS — I482 Chronic atrial fibrillation: Secondary | ICD-10-CM

## 2015-10-08 DIAGNOSIS — I10 Essential (primary) hypertension: Secondary | ICD-10-CM | POA: Diagnosis not present

## 2015-10-08 DIAGNOSIS — I4821 Permanent atrial fibrillation: Secondary | ICD-10-CM

## 2015-10-08 LAB — CUP PACEART INCLINIC DEVICE CHECK
Battery Impedance: 251 Ohm
Battery Voltage: 2.77 V
Date Time Interrogation Session: 20170608170755
Implantable Lead Implant Date: 20160318
Implantable Lead Location: 753860
Implantable Lead Model: 5076
Lead Channel Pacing Threshold Amplitude: 0.625 V
Lead Channel Pacing Threshold Amplitude: 0.75 V
Lead Channel Pacing Threshold Pulse Width: 0.4 ms
Lead Channel Pacing Threshold Pulse Width: 0.4 ms
Lead Channel Sensing Intrinsic Amplitude: 4 mV
Lead Channel Setting Pacing Amplitude: 2.5 V
MDC IDC MSMT BATTERY REMAINING LONGEVITY: 103 mo
MDC IDC MSMT LEADCHNL RA IMPEDANCE VALUE: 0 Ohm
MDC IDC MSMT LEADCHNL RV IMPEDANCE VALUE: 538 Ohm
MDC IDC SET LEADCHNL RV PACING PULSEWIDTH: 0.4 ms
MDC IDC SET LEADCHNL RV SENSING SENSITIVITY: 2 mV
MDC IDC STAT BRADY RV PERCENT PACED: 58 %

## 2015-10-08 NOTE — Patient Instructions (Signed)
Medication Instructions:   Your physician recommends that you continue on your current medications as directed. Please refer to the Current Medication list given to you today.   /If you need a refill on your cardiac medications before your next appointmen/t, please call your pharmacy.  Labwork:  NONE ORDER TODAY '   Testing/Procedures:  NONE ORDER TODAY    Follow-Up:  Your physician wants you to follow-up in: [place] will receive a reminder letter in the mail two months in advance. If you don't receive a letter, please call our office to schedule the follow-up appointment.  Remote monitoring is used to monitor your Pacemaker of ICD from home. This monitoring reduces the number of office visits required to check your device to one time per year. It allows [place] to keep an eye on the functioning of your device to ensure it is working properly. You are scheduled for a device check from home on .01/07/2016.Marland KitchenMarland KitchenYou may send your transmission at any time that day. If you have a wireless device, the transmission will be sent automatically. After your physician reviews your transmission, you will receive a postcard with your next transmission date.     Any Other Special Instructions Will Be Listed Below (If Applicable).

## 2015-10-08 NOTE — Progress Notes (Signed)
Electrophysiology Office Note Date: 10/08/2015  ID:  [name], DOB 08-04-1928, MRN [id number]  PCP: [name], MD Primary Cardiologist: Johnsie Cancel Electrophysiologist: Allred  CC: Pacemaker follow-up  Dakota [name] is a 82 y.o. male seen today for Dr Rayann Heman.  He presents today for routine electrophysiology followup.  Since last being seen in our clinic, the patient reports doing reasonably well. He is remaining active at home. He has rare chest pain that is stable. He has not used NTG. He also has chronic LE edema.  He denies  palpitations, dyspnea, PND, orthopnea, nausea, vomiting, dizziness, syncope, weight gain, or early satiety.  Device History: MDT single chamber PPM implanted 2016 for complete heart block    Past Medical History  Diagnosis Date  . GERD (gastroesophageal reflux disease) 10/2002  . Hyperlipidemia 12/1994  . Hypertension   . Permanent atrial fibrillation ([name])     Refused coumadin therapy  . Back pain, chronic   . Lung collapse 03/1991    Fall  . Complete heart block ([place]) 07/18/2014    Medtronic [place] model [id number] (serial number [id number])  singe lead PPM  . Hypokalemia   . Anxiety    Past Surgical History  Procedure Laterality Date  . Back surgery  11/91    with hardware fixation  . Cardiac catheterization  06/2004    30 % stenosis, EF normal  . Eye surgery      Retinal bubble surgery, cataract   . Temporary pacemaker insertion N/A 07/18/2014    Procedure: TEMPORARY PACEMAKER INSERTION;  Surgeon: Peter M Martinique, MD;  Location: [facility] CATH LAB;  Service: Cardiovascular;  Laterality: N/A;  . Left heart catheterization with coronary angiogram N/A 07/18/2014    Procedure: LEFT HEART CATHETERIZATION WITH CORONARY ANGIOGRAM;  Surgeon: Jettie Booze, MD; [facility] [place], LAD mild dz, D1 80%, D2 OK, CFX system OK, RCA OK, PDA 100%, med rx  . Permanent pacemaker insertion N/A 07/18/2014    Procedure: PERMANENT PACEMAKER INSERTION;  Surgeon: Thompson Grayer, MD;  Medtronic [place] model [id number] (serial number [id number])      Current Outpatient Prescriptions  Medication Sig Dispense Refill  . ALPRAZolam (XANAX) 0.5 MG tablet TAKE ONE-HALF TABLET BY MOUTH TWICE DAILY AS NEEDED FOR ANXIETY 60 tablet 0  . aspirin 81 MG tablet Take 81 mg by mouth daily.    . calcium carbonate (TUMS - DOSED IN MG ELEMENTAL CALCIUM) 500 MG chewable tablet Chew 1 tablet by mouth daily as needed for indigestion.     . carvedilol (COREG) 25 MG tablet Take 1 tablet (25 mg total) by mouth 2 (two) times daily with a meal. 180 tablet 3  . docusate sodium (COLACE) 100 MG capsule Take 1 capsule (100 mg total) by mouth 2 (two) times daily. 180 capsule 3  . hydrochlorothiazide (MICROZIDE) 12.5 MG capsule Take 1 capsule (12.5 mg total) by mouth daily. 90 capsule 3  . nitroGLYCERIN (NITROSTAT) 0.4 MG SL tablet Place 1 tablet (0.4 mg total) under the tongue every 5 (five) minutes as needed for chest pain. 25 tablet 3  . ranitidine (ZANTAC) 150 MG tablet Take 150 mg by mouth daily.      No current facility-administered medications for this visit.    Allergies:   Warfarin and related; Atorvastatin; Diazepam; Ezetimibe; and Penicillins   Social History: Social History   Social History  . Marital Status: Married    Spouse Name: N/A  . Number of Children: 4  . Years of Education: N/A  Occupational History  . retired     Architect   Social History Main Topics  . Smoking status: Former Smoker -- 1.00 packs/day for 15 years    Quit date: 05/02/1953  . Smokeless tobacco: Current User    Types: Chew     Comment: Quit smoking over 50 years ago  . Alcohol Use: No  . Drug Use: No  . Sexual Activity: No   Other Topics Concern  . Not on file   Social History Narrative   Retired from [employer], building/construction   Married 1953   4 kids   Caring for his wife at home- source of anxiety for patient    Family History: Family History  Problem Relation Age of Onset  .  Pneumonia Mother   . Hip fracture Mother   . Cancer Father     splenic  . Heart disease Sister     Pacer placed  . Cancer Brother     throat and lung  . Heart disease Son   . Stroke Neg Hx   . Colon cancer Neg Hx   . Cancer Brother     prostate, age 80  . Prostate cancer Brother      Review of Systems: All other systems reviewed and are otherwise negative except as noted above.   Physical Exam: VS:  BP 114/80 mmHg  Pulse 81  Ht 5\' 9"  (1.753 m)  Wt 193 lb 6.4 oz (87.726 kg)  BMI 28.55 kg/m2 , BMI Body mass index is 28.55 kg/(m^2).  GEN- The patient is elderly appearing, alert and oriented x 3 today.   HEENT: normocephalic, atraumatic; sclera clear, conjunctiva pink; hearing intact; oropharynx clear; neck supple  Lungs- Clear to ausculation bilaterally, normal work of breathing.  No wheezes, rales, rhonchi Heart- Regular rate and rhythm (paced) GI- soft, non-tender, non-distended, bowel sounds present  Extremities- no clubbing, cyanosis, or edema; DP/PT/radial pulses 2+ bilaterally MS- no significant deformity or atrophy Skin- warm and dry, no rash or lesion; PPM pocket well healed Psych- euthymic mood, full affect Neuro- strength and sensation are intact  PPM Interrogation- reviewed in detail today,  See PACEART report  EKG:  EKG is ordered today. The ekg ordered today shows atrial fibrillation with ventricular pacing   Recent Labs: 10/05/2015: ALT 15; BUN 25*; Creatinine, Ser 1.26; Hemoglobin 14.8; Platelets 259.0; Potassium 4.3; Sodium 137   Wt Readings from Last 3 Encounters:  10/08/15 193 lb 6.4 oz (87.726 kg)  10/05/15 194 lb 12 oz (88.338 kg)  09/23/15 198 lb 8 oz (90.039 kg)     Other studies Reviewed: Additional studies/ records that were reviewed today include: Dr Johnsie Cancel and Dr Jackalyn Lombard office notes  Assessment and Plan:  1.  Complete heart block Normal PPM function See Pace Art report No changes today  2.  Permanent atrial fibrillation CHADS2VASC  is at least 4 He remains clear in his decision to decline anticoagulation, discussed with patient again today  3.  HTN Stable No change required today  4.  CAD Stable by symptoms   Current medicines are reviewed at length with the patient today.   The patient does not have concerns regarding his medicines.  The following changes were made today:  none  Labs/ tests ordered today include: none  Orders Placed This Encounter  Procedures  . EKG 12-Lead     Disposition:   Follow up with [facility] transmissions, Dr Johnsie Cancel as scheduled, me in 1 year      Signed, Museum/gallery conservator  Lynnell Jude, NP 10/08/2015 1:09 PM  [address] [place] [address] [phone] (office) [phone] (fax)

## 2015-10-09 ENCOUNTER — Encounter: Payer: Self-pay | Admitting: *Deleted

## 2015-11-16 ENCOUNTER — Encounter: Payer: Self-pay | Admitting: Cardiovascular Disease

## 2015-11-16 ENCOUNTER — Other Ambulatory Visit: Payer: Self-pay | Admitting: Family Medicine

## 2015-11-17 NOTE — Telephone Encounter (Signed)
Electronic refill request. Last Filled:    60 tablet 0 09/17/2015  Last office visit:   10/05/15  Please advise.

## 2015-11-18 ENCOUNTER — Other Ambulatory Visit: Payer: Self-pay | Admitting: Family Medicine

## 2015-11-18 NOTE — Telephone Encounter (Signed)
Rx called to pharmacy as instructed. 

## 2015-11-18 NOTE — Telephone Encounter (Signed)
Please call in.  Thanks.   

## 2015-12-03 ENCOUNTER — Telehealth: Payer: Self-pay | Admitting: Family Medicine

## 2015-12-03 DIAGNOSIS — Z87448 Personal history of other diseases of urinary system: Secondary | ICD-10-CM

## 2015-12-03 NOTE — Telephone Encounter (Signed)
Call pt. H/o hematuria with neg w/u 2016. Reasonable to recheck u/a in ~12/2015 (and again ~11/2016). If neg and no further sx, then no f/u needed after that.  Order in for u/a.  Thanks.   Lab visit only, unless he has other concerns.

## 2015-12-04 NOTE — Telephone Encounter (Signed)
Patient refused to make appt for lab only.  I explained that this was only for a urine specimen and not to see the MD and he voiced understanding but preferred not to come in for UA.

## 2015-12-06 NOTE — Telephone Encounter (Signed)
Noted. Thanks.

## 2015-12-16 NOTE — Telephone Encounter (Signed)
Pt scheduled for 08/18 or lab appt  Pt aware

## 2015-12-18 ENCOUNTER — Other Ambulatory Visit (INDEPENDENT_AMBULATORY_CARE_PROVIDER_SITE_OTHER): Payer: Medicare Other

## 2015-12-18 DIAGNOSIS — Z87448 Personal history of other diseases of urinary system: Secondary | ICD-10-CM

## 2015-12-18 NOTE — Addendum Note (Signed)
Addended by: Ellamae Sia on: 12/18/2015 03:50 PM   Modules accepted: Orders

## 2015-12-18 NOTE — Addendum Note (Signed)
Addended by: Ellamae Sia on: 12/18/2015 04:30 PM   Modules accepted: Orders

## 2015-12-21 LAB — URINALYSIS, MICROSCOPIC ONLY

## 2015-12-21 NOTE — Addendum Note (Signed)
Addended by: Ellamae Sia on: 12/21/2015 02:57 PM   Modules accepted: Orders

## 2015-12-22 ENCOUNTER — Encounter: Payer: Self-pay | Admitting: *Deleted

## 2016-01-06 ENCOUNTER — Encounter (HOSPITAL_COMMUNITY)
Admission: EM | Disposition: A | Discharge status: Home / Self Care | Payer: Self-pay | Source: Home / Self Care | Attending: Emergency Medicine

## 2016-01-06 ENCOUNTER — Emergency Department (HOSPITAL_COMMUNITY): Payer: Medicare Other

## 2016-01-06 ENCOUNTER — Encounter (HOSPITAL_COMMUNITY): Payer: Self-pay | Admitting: Emergency Medicine

## 2016-01-06 ENCOUNTER — Ambulatory Visit: Payer: Medicare Other | Admitting: Family Medicine

## 2016-01-06 ENCOUNTER — Telehealth: Payer: Self-pay | Admitting: Cardiovascular Disease

## 2016-01-06 ENCOUNTER — Ambulatory Visit (INDEPENDENT_AMBULATORY_CARE_PROVIDER_SITE_OTHER)
Admission: EM | Admit: 2016-01-06 | Discharge: 2016-01-06 | Disposition: A | Discharge status: Nursing Home (Includes ICF) | Payer: Medicare Other | Source: Home / Self Care | Attending: Emergency Medicine | Admitting: Emergency Medicine

## 2016-01-06 ENCOUNTER — Ambulatory Visit (HOSPITAL_COMMUNITY)
Admission: EM | Admit: 2016-01-06 | Discharge: 2016-01-07 | Disposition: A | Discharge status: Home / Self Care | Payer: Medicare Other | Attending: Emergency Medicine | Admitting: Emergency Medicine

## 2016-01-06 ENCOUNTER — Encounter (HOSPITAL_COMMUNITY): Payer: Self-pay | Admitting: Certified Registered"

## 2016-01-06 DIAGNOSIS — M549 Dorsalgia, unspecified: Secondary | ICD-10-CM | POA: Insufficient documentation

## 2016-01-06 DIAGNOSIS — I2510 Atherosclerotic heart disease of native coronary artery without angina pectoris: Secondary | ICD-10-CM | POA: Insufficient documentation

## 2016-01-06 DIAGNOSIS — I482 Chronic atrial fibrillation: Secondary | ICD-10-CM | POA: Diagnosis not present

## 2016-01-06 DIAGNOSIS — Z950 Presence of cardiac pacemaker: Secondary | ICD-10-CM | POA: Insufficient documentation

## 2016-01-06 DIAGNOSIS — G8929 Other chronic pain: Secondary | ICD-10-CM | POA: Insufficient documentation

## 2016-01-06 DIAGNOSIS — Z7901 Long term (current) use of anticoagulants: Secondary | ICD-10-CM | POA: Insufficient documentation

## 2016-01-06 DIAGNOSIS — Z7982 Long term (current) use of aspirin: Secondary | ICD-10-CM | POA: Diagnosis not present

## 2016-01-06 DIAGNOSIS — T18128A Food in esophagus causing other injury, initial encounter: Secondary | ICD-10-CM | POA: Insufficient documentation

## 2016-01-06 DIAGNOSIS — E785 Hyperlipidemia, unspecified: Secondary | ICD-10-CM | POA: Insufficient documentation

## 2016-01-06 DIAGNOSIS — K222 Esophageal obstruction: Secondary | ICD-10-CM | POA: Diagnosis not present

## 2016-01-06 DIAGNOSIS — K210 Gastro-esophageal reflux disease with esophagitis: Secondary | ICD-10-CM | POA: Insufficient documentation

## 2016-01-06 DIAGNOSIS — I442 Atrioventricular block, complete: Secondary | ICD-10-CM | POA: Insufficient documentation

## 2016-01-06 DIAGNOSIS — X58XXXA Exposure to other specified factors, initial encounter: Secondary | ICD-10-CM | POA: Insufficient documentation

## 2016-01-06 DIAGNOSIS — Z880 Allergy status to penicillin: Secondary | ICD-10-CM | POA: Insufficient documentation

## 2016-01-06 DIAGNOSIS — R1310 Dysphagia, unspecified: Secondary | ICD-10-CM | POA: Diagnosis not present

## 2016-01-06 DIAGNOSIS — I10 Essential (primary) hypertension: Secondary | ICD-10-CM | POA: Insufficient documentation

## 2016-01-06 DIAGNOSIS — Z87891 Personal history of nicotine dependence: Secondary | ICD-10-CM | POA: Diagnosis not present

## 2016-01-06 HISTORY — PX: ESOPHAGOGASTRODUODENOSCOPY: SHX5428

## 2016-01-06 LAB — CBC
HEMATOCRIT: 49.1 % (ref 39.0–52.0)
Hemoglobin: 16.3 g/dL (ref 13.0–17.0)
MCH: 30.5 pg (ref 26.0–34.0)
MCHC: 33.2 g/dL (ref 30.0–36.0)
MCV: 91.9 fL (ref 78.0–100.0)
PLATELETS: 215 10*3/uL (ref 150–400)
RBC: 5.34 MIL/uL (ref 4.22–5.81)
RDW: 13.2 % (ref 11.5–15.5)
WBC: 11.7 10*3/uL — AB (ref 4.0–10.5)

## 2016-01-06 LAB — COMPREHENSIVE METABOLIC PANEL
ALBUMIN: 3.8 g/dL (ref 3.5–5.0)
ALT: 17 U/L (ref 17–63)
AST: 20 U/L (ref 15–41)
Alkaline Phosphatase: 46 U/L (ref 38–126)
Anion gap: 7 (ref 5–15)
BUN: 21 mg/dL — AB (ref 6–20)
CHLORIDE: 105 mmol/L (ref 101–111)
CO2: 27 mmol/L (ref 22–32)
CREATININE: 1.17 mg/dL (ref 0.61–1.24)
Calcium: 9.9 mg/dL (ref 8.9–10.3)
GFR calc Af Amer: >60 mL/min (ref >60)
GFR, EST NON AFRICAN AMERICAN: 55 mL/min — AB (ref >60)
GLUCOSE: 119 mg/dL — AB (ref 65–99)
POTASSIUM: 5 mmol/L (ref 3.5–5.1)
Sodium: 139 mmol/L (ref 135–145)
Total Bilirubin: 1.6 mg/dL — ABNORMAL HIGH (ref 0.3–1.2)
Total Protein: 7.4 g/dL (ref 6.5–8.1)

## 2016-01-06 LAB — LIPASE, BLOOD: LIPASE: 25 U/L (ref 11–51)

## 2016-01-06 SURGERY — EGD (ESOPHAGOGASTRODUODENOSCOPY)
Anesthesia: Monitor Anesthesia Care

## 2016-01-06 SURGERY — EGD (ESOPHAGOGASTRODUODENOSCOPY)
Anesthesia: General

## 2016-01-06 MED ORDER — FENTANYL CITRATE (PF) 100 MCG/2ML IJ SOLN
INTRAMUSCULAR | Status: AC
  Filled 2016-01-06: qty 2

## 2016-01-06 MED ORDER — PROPOFOL 10 MG/ML IV BOLUS
INTRAVENOUS | Status: AC
  Filled 2016-01-06: qty 20

## 2016-01-06 MED ORDER — PANTOPRAZOLE SODIUM 40 MG PO TBEC: 40.0000 mg | DELAYED_RELEASE_TABLET | Freq: Every day | ORAL | 0 refills | Status: DC

## 2016-01-06 MED ORDER — LIDOCAINE 2% (20 MG/ML) 5 ML SYRINGE
INTRAMUSCULAR | Status: AC
  Filled 2016-01-06: qty 5

## 2016-01-06 MED ORDER — GI COCKTAIL ~~LOC~~
30.0000 mL | Freq: Once | ORAL | Status: AC
  Administered 2016-01-06: 30 mL via ORAL
  Filled 2016-01-06: qty 30

## 2016-01-06 MED ORDER — PHENYLEPHRINE 40 MCG/ML (10ML) SYRINGE FOR IV PUSH (FOR BLOOD PRESSURE SUPPORT)
PREFILLED_SYRINGE | INTRAVENOUS | Status: AC
  Filled 2016-01-06: qty 10

## 2016-01-06 MED ORDER — GLUCAGON HCL RDNA (DIAGNOSTIC) 1 MG IJ SOLR
1.0000 mg | Freq: Once | INTRAMUSCULAR | Status: AC
  Administered 2016-01-06: 1 mg via INTRAVENOUS
  Filled 2016-01-06: qty 1

## 2016-01-06 MED ORDER — ONDANSETRON HCL 4 MG/2ML IJ SOLN
INTRAMUSCULAR | Status: AC
  Filled 2016-01-06: qty 2

## 2016-01-06 MED ORDER — MIDAZOLAM HCL 10 MG/2ML IJ SOLN
INTRAMUSCULAR | Status: DC | PRN
  Administered 2016-01-06: 2 mg via INTRAVENOUS
  Administered 2016-01-06 (×2): 1 mg via INTRAVENOUS

## 2016-01-06 MED ORDER — FENTANYL CITRATE (PF) 100 MCG/2ML IJ SOLN
INTRAMUSCULAR | Status: DC | PRN
Start: 2016-01-06 — End: 2016-01-06
  Administered 2016-01-06 (×2): 25 ug via INTRAVENOUS

## 2016-01-06 MED ORDER — MIDAZOLAM HCL 5 MG/ML IJ SOLN
INTRAMUSCULAR | Status: AC
  Filled 2016-01-06: qty 2

## 2016-01-06 MED ORDER — ROCURONIUM BROMIDE 10 MG/ML (PF) SYRINGE
PREFILLED_SYRINGE | INTRAVENOUS | Status: AC
  Filled 2016-01-06: qty 10

## 2016-01-06 MED ORDER — EPHEDRINE 5 MG/ML INJ
INTRAVENOUS | Status: AC
  Filled 2016-01-06: qty 10

## 2016-01-06 MED ORDER — ONDANSETRON HCL 4 MG/2ML IJ SOLN
4.0000 mg | Freq: Once | INTRAMUSCULAR | Status: AC
  Administered 2016-01-06: 4 mg via INTRAVENOUS
  Filled 2016-01-06: qty 2

## 2016-01-06 MED ORDER — BUTAMBEN-TETRACAINE-BENZOCAINE 2-2-14 % EX AERO
INHALATION_SPRAY | CUTANEOUS | Status: DC | PRN
  Administered 2016-01-06: 2 via TOPICAL

## 2016-01-06 MED ORDER — SUCCINYLCHOLINE CHLORIDE 200 MG/10ML IV SOSY
PREFILLED_SYRINGE | INTRAVENOUS | Status: AC
  Filled 2016-01-06: qty 10

## 2016-01-06 NOTE — Telephone Encounter (Signed)
Called patient's son back. Patient's son reports that patient choked last night on some food, and feels like something might still be stuck. Patient is breathing fine, but is not able to swallow water without spiting in back up. Informed patient's son that he needs to go to ED. Patient's other son is taking him to urgent care at this time. Informed patient's son that patient needs to have this evaluated sooner than later, and they are doing the right thing by getting medical care right away. Patient's son verbalized understanding.

## 2016-01-06 NOTE — Consult Note (Signed)
Referring Provider:  ER  Primary Care Physician:  [name], MD Primary Gastroenterologist: Marlynn Perking  Reason for Consultation: Food impaction  HPI: Dakota [name] is a 82 y.o. male presented to the hospital with nausea vomiting and unable to take oral intake. Patient ate some barbecue last night and felt something stuck in the throat. Had multiple episodes of coughing and choking. Subsequently patient felt something stuck in the esophagus. Again this all happened last night. Patient continues to have nausea and recurrent vomiting since last night and decided to come to the hospital this morning. Patient is not able to swallow his own saliva. Occasional dysphagia to solid food on and off since last many years. Denied any abdominal pain. Denied blood in the stool or black stool. Denied chest pain or shortness of breath. Not on any anticoagulation.  Past Medical History:  Diagnosis Date  . Anxiety   . Back pain, chronic   . Complete heart block ([place]) 07/18/2014   Medtronic [place] model [id number] (serial number [id number])  singe lead PPM  . GERD (gastroesophageal reflux disease) 10/2002  . Hyperlipidemia 12/1994  . Hypertension   . Hypokalemia   . Lung collapse 03/1991   Fall  . Permanent atrial fibrillation ([name])    Refused coumadin therapy    Past Surgical History:  Procedure Laterality Date  . BACK SURGERY  11/91   with hardware fixation  . CARDIAC CATHETERIZATION  06/2004   30 % stenosis, EF normal  . EYE SURGERY     Retinal bubble surgery, cataract   . LEFT HEART CATHETERIZATION WITH CORONARY ANGIOGRAM N/A 07/18/2014   Procedure: LEFT HEART CATHETERIZATION WITH CORONARY ANGIOGRAM;  Surgeon: Jettie Booze, MD; [facility] [place], LAD mild dz, D1 80%, D2 OK, CFX system OK, RCA OK, PDA 100%, med rx  . PERMANENT PACEMAKER INSERTION N/A 07/18/2014   Procedure: PERMANENT PACEMAKER INSERTION;  Surgeon: Thompson Grayer, MD; Medtronic [place] model [phone] (serial number [id number])    . TEMPORARY  PACEMAKER INSERTION N/A 07/18/2014   Procedure: TEMPORARY PACEMAKER INSERTION;  Surgeon: Peter M Martinique, MD;  Location[facility] CATH LAB;  Service: Cardiovascular;  Laterality: N/A;    Prior to Admission medications   Medication Sig Start Date End Date Taking? Authorizing Provider  ALPRAZolam Duanne Moron) 0.5 MG tablet TAKE ONE-HALF TABLET BY MOUTH TWICE DAILY AS NEEDED FOR ANXIETY 11/18/15  Yes Tonia Ghent, MD  aspirin 81 MG tablet Take 81 mg by mouth daily.   Yes Historical Provider, MD  calcium carbonate (TUMS - DOSED IN MG ELEMENTAL CALCIUM) 500 MG chewable tablet Chew 1 tablet by mouth daily as needed for indigestion.    Yes Historical Provider, MD  carvedilol (COREG) 25 MG tablet Take 1 tablet (25 mg total) by mouth 2 (two) times daily with a meal. 07/21/15  Yes Josue Hector, MD  docusate sodium (COLACE) 100 MG capsule Take 1 capsule (100 mg total) by mouth 2 (two) times daily. 07/21/15  Yes Josue Hector, MD  hydrochlorothiazide (MICROZIDE) 12.5 MG capsule Take 1 capsule (12.5 mg total) by mouth daily. 07/21/15  Yes Josue Hector, MD  nitroGLYCERIN (NITROSTAT) 0.4 MG SL tablet Place 1 tablet (0.4 mg total) under the tongue every 5 (five) minutes as needed for chest pain. 01/07/15  Yes Josue Hector, MD  ranitidine (ZANTAC) 150 MG tablet Take 150 mg by mouth daily.  08/12/15  Yes Historical Provider, MD    Scheduled Meds: Continuous Infusions: PRN Meds:.  Allergies as of 01/06/2016 - Review Complete  01/06/2016  Allergen Reaction Noted  . Warfarin and related Other (See Comments) 10/22/2014  . Atorvastatin Other (See Comments) 05/22/2007  . Ezetimibe Other (See Comments) 05/22/2007  . Diazepam Other (See Comments) 05/22/2007  . Penicillins Other (See Comments) 05/22/2007    Family History  Problem Relation Age of Onset  . Pneumonia Mother   . Hip fracture Mother   . Cancer Father     splenic  . Heart disease Sister     Pacer placed  . Cancer Brother     throat and lung  . Cancer  Brother     prostate, age 44  . Prostate cancer Brother   . Heart disease Son   . Stroke Neg Hx   . Colon cancer Neg Hx     Social History   Social History  . Marital status: Married    Spouse name: N/A  . Number of children: 4  . Years of education: N/A   Occupational History  . retired     Architect   Social History Main Topics  . Smoking status: Former Smoker    Packs/day: 1.00    Years: 15.00    Quit date: 05/02/1953  . Smokeless tobacco: Current User    Types: Chew     Comment: Quit smoking over 50 years ago  . Alcohol use No  . Drug use: No  . Sexual activity: No   Other Topics Concern  . Not on file   Social History Narrative   Retired from [employer], building/construction   Married 1953   4 kids   Caring for his wife at home- source of anxiety for patient    Review of Systems: All negative except as stated above in HPI.  Physical Exam: Vital signs: Vitals:   01/06/16 1830 01/06/16 1930  BP: (!) 158/107 162/96  Pulse: 70 67  Resp:  16  Temp:       General:   Alert,  Well-developed, well-nourished, pleasant and cooperative in NAD Lungs:  Clear throughout to auscultation.   No wheezes, crackles, or rhonchi. No acute distress. Heart:  Regular rate and rhythm; no murmurs, clicks, rubs,  or gallops. Abdomen:Soft, nontender, nondistended. Bowel sounds present Lower extent. No edema Rectal:  Deferred  GI:  Lab Results:  Recent Labs  01/06/16 1128  WBC 11.7*  HGB 16.3  HCT 49.1  PLT 215   BMET  Recent Labs  01/06/16 1128  NA 139  K 5.0  CL 105  CO2 27  GLUCOSE 119*  BUN 21*  CREATININE 1.17  CALCIUM 9.9   LFT  Recent Labs  01/06/16 1128  PROT 7.4  ALBUMIN 3.8  AST 20  ALT 17  ALKPHOS 46  BILITOT 1.6*   PT/INR No results for input(s): LABPROT, INR in the last 72 hours.   Studies/Results: Dg Chest 2 View  Result Date: 01/06/2016 CLINICAL DATA:  Difficulty swallowing.  Vomiting. EXAM: CHEST  2 VIEW COMPARISON:   07/02/2014 . FINDINGS: Cardiac pacer noted with lead tip projected over the right ventricle. Heart size normal. Lungs are clear. No pleural effusion or pneumothorax. Biapical pleural thickening noted most likely scarring. Degenerative changes thoracic spine. Lumbar spine fusion. IMPRESSION: 1. Cardiac pacer noted with lead tip projected right ventricle. Heart size normal. 2. No acute pulmonary disease. Electronically Signed   By: Marcello Moores  Register   On: 01/06/2016 17:00    Impression/Plan: Food impaction History of complete heart block status post pacemaker placement Atrial fibrillation. Currently not on anticoagulation  History of coronary artery disease  Recommendations -------------------------- - EGD today risks, benefits and alternatives discussed with the patient and family. Risk of perforation also discussed. Verbalized understanding. - Further management based on endoscopic finding.  Patient was seen in the ER. Critical-care time around 40 minutes.    LOS: 0 days   Jazzmon Prindle  01/06/2016, 9:16 PM  Pager [phone] If no answer or after 5 PM call [phone]

## 2016-01-06 NOTE — Op Note (Signed)
[facility] Patient Name: Dakota [name] Procedure Date : 01/06/2016 MRN: [id number] Attending MD: Otis Brace , MD Date of Birth: [birth year] CSN: [id number] Age: 82 Admit Type: Inpatient Procedure:                Upper GI endoscopy Indications:              Dysphagia, Foreign body in the esophagus Providers:                Otis Brace, MD, Zenon Mayo, RN, Cherylynn Ridges, Technician Referring MD:              Medicines:                Fentanyl 50 micrograms IV, Midazolam 4 mg IV Complications:            No immediate complications. Estimated Blood Loss:     Estimated blood loss: none. Procedure:                Pre-Anesthesia Assessment:                           - Prior to the procedure, a History and Physical                            was performed, and patient medications and                            allergies were reviewed. The patient's tolerance of                            previous anesthesia was also reviewed. The risks                            and benefits of the procedure and the sedation                            options and risks were discussed with the patient.                            All questions were answered, and informed consent                            was obtained. Prior Anticoagulants: The patient has                            taken aspirin, last dose was day of procedure. ASA                            Grade Assessment: III - A patient with severe                            systemic disease. After reviewing the risks and  benefits, the patient was deemed in satisfactory                            condition to undergo the procedure.                           After obtaining informed consent, the endoscope was                            passed under direct vision. Throughout the                            procedure, the patient's blood pressure, pulse, and       oxygen saturations were monitored continuously. The                            Endoscope was introduced through the mouth, and                            advanced to the second part of duodenum. The upper                            GI endoscopy was accomplished with ease. The                            patient tolerated the procedure well. Scope In: Scope Out: Findings:      Food was found in the lower third of the esophagus.      food bolus was removed using a Roth net and Presenter, broadcasting. Small amount       of food residue was pushed down into the stomach.      LA Grade D (one or more mucosal breaks involving at least 75% of       esophageal circumference) esophagitis was found 38 cm from the incisors.      A moderate Schatzki ring (acquired) was found in the distal esophagus.      Diffuse mildly erythematous mucosa without bleeding was found in the       gastric antrum.      The duodenal bulb, first portion of the duodenum and second portion of       the duodenum were normal. Impression:               - Food in the lower third of the esophagus.                           - LA Grade D reflux esophagitis.                           - Moderate Schatzki ring.                           - Erythematous mucosa in the antrum.                           - Normal duodenal bulb, first portion of the  duodenum and second portion of the duodenum.                           - No specimens collected. Moderate Sedation:      Moderate (conscious) sedation was administered by the endoscopy nurse       and supervised by the endoscopist. The following parameters were       monitored: oxygen saturation, heart rate, blood pressure, and response       to care. Recommendation:           - Discharge patient to home (ambulatory).                           - Mechanical soft diet.                           - Use Protonix (pantoprazole) 40 mg PO daily for 8                             weeks.                           - Repeat upper endoscopy in 8 weeks to check                            healing.                           - Return to my office in 6 weeks.                           - Continue present medications. Procedure Code(s):        --- Professional ---                           [phone], Esophagogastroduodenoscopy, flexible,                            transoral; diagnostic, including collection of                            specimen(s) by brushing or washing, when performed                            (separate procedure) Diagnosis Code(s):        --- Professional ---                           [id number], Food in esophagus causing other injury,                            initial encounter                           K21.0, Gastro-esophageal reflux disease with                            esophagitis  K22.2, Esophageal obstruction                           K31.89, Other diseases of stomach and duodenum                           R13.10, Dysphagia, unspecified                           T18.108A, Unspecified foreign body in esophagus                            causing other injury, initial encounter CPT copyright 2016 American Medical Association. All rights reserved. The codes documented in this report are preliminary and upon coder review may  be revised to meet current compliance requirements. Otis Brace, MD Otis Brace, MD 01/06/2016 10:24:58 PM Number of Addenda: 0

## 2016-01-06 NOTE — ED Provider Notes (Signed)
[facility] DEPT Provider Note   CSN: [id number] Arrival date & time: 01/06/16  1117     History   Chief Complaint Chief Complaint  Patient presents with  . Emesis  . Foreign Body    HPI Dakota [name] is a 82 y.o. male.  HPI   82 year old male with hx of esophageal stricture, GERD, HTN, HLD, afib  Not on anticoagulant, sent here from [facility] for evaluation of dysphagia.  Pt report eating BBQ pork, and grapes for dinner last night.  He felt the pork was caught in his throat.  He proceed to vomit and was able to bring up the pork.  However, since then he report having some discomfort to his throat and haven't been able to eat or drink anything.  sts he tried drinking his coffee and take his morning medication but it wouldn't pass. He has had recurrent dysphagia for the past 5-7 years since he had a screening endoscopy/colonoscopy in the past.  He would have episodic dysphagia every 3-4 months according his son who is at bedside, however usually able to resolve without specific treatment.  He normally takes Zantac for his GERD.  He denies fever, chills, lightheadedness, dizziness, cp, sob, productive cough, abd pain, back pain, focal numbness/weakness, difficulty thinking or facial droops. Pt did reach out to his PCP and tried to schedule f/u with GI but sts next appointment is Sept 18th, he doesn't want to wait that long.   Past Medical History:  Diagnosis Date  . Anxiety   . Back pain, chronic   . Complete heart block ([place]) 07/18/2014   Medtronic [place] model [id number] (serial number [id number])  singe lead PPM  . GERD (gastroesophageal reflux disease) 10/2002  . Hyperlipidemia 12/1994  . Hypertension   . Hypokalemia   . Lung collapse 03/1991   Fall  . Permanent atrial fibrillation ([place])    Refused coumadin therapy    Patient Active Problem List   Diagnosis Date Noted  . Caregiver stress 10/05/2015  . Hematuria 04/06/2015  . ARF (acute renal failure) ([place])  07/18/2014  . Lactic acidosis 07/18/2014  . Cardiogenic shock ([place]) 07/18/2014  . Elevated troponin level 07/18/2014  . Atrial fibrillation with slow ventricular response ([place])   . NSTEMI (non-ST elevated myocardial infarction) ([place])   . CHB (complete heart block) - s/p MDT 1 lead PPM 07/17/2014  . Advance care planning 07/03/2014  . Sebaceous cyst 04/11/2013  . Medicare annual wellness visit, subsequent 10/28/2012  . RIB PAIN, RIGHT SIDED 01/02/2009  . CAD, NATIVE VESSEL 11/25/2008  . Unspecified vitamin D deficiency 11/24/2008  . Anxiety state 11/24/2008  . RECENT RET DETACH PARTIAL W/SINGLE DEFECT 11/24/2008  . ESSENTIAL HYPERTENSION, BENIGN 07/21/2008  . HLD (hyperlipidemia) 05/22/2007  . Atrial fibrillation ([place]) 05/22/2007  . GERD 05/22/2007  . GASTRIC ULCER, ACUTE, HEMORRHAGE 05/22/2007  . Hyperglycemia 05/22/2007    Past Surgical History:  Procedure Laterality Date  . BACK SURGERY  11/91   with hardware fixation  . CARDIAC CATHETERIZATION  06/2004   30 % stenosis, EF normal  . EYE SURGERY     Retinal bubble surgery, cataract   . LEFT HEART CATHETERIZATION WITH CORONARY ANGIOGRAM N/A 07/18/2014   Procedure: LEFT HEART CATHETERIZATION WITH CORONARY ANGIOGRAM;  Surgeon: Jettie Booze, MD; [facility] [place], LAD mild dz, D1 80%, D2 OK, CFX system OK, RCA OK, PDA 100%, med rx  . PERMANENT PACEMAKER INSERTION N/A 07/18/2014   Procedure: PERMANENT PACEMAKER INSERTION;  Surgeon: Jeneen Rinks  Allred, MD; Medtronic [place] model [id number] (serial number [id number])    . TEMPORARY PACEMAKER INSERTION N/A 07/18/2014   Procedure: TEMPORARY PACEMAKER INSERTION;  Surgeon: Peter M Martinique, MD;  Location: [facility] CATH LAB;  Service: Cardiovascular;  Laterality: N/A;       Home Medications    Prior to Admission medications   Medication Sig Start Date End Date Taking? Authorizing Provider  ALPRAZolam Duanne Moron) 0.5 MG tablet TAKE ONE-HALF TABLET BY MOUTH TWICE DAILY AS NEEDED FOR ANXIETY 11/18/15   Tonia Ghent, MD  aspirin 81 MG tablet Take 81 mg by mouth daily.    Historical Provider, MD  calcium carbonate (TUMS - DOSED IN MG ELEMENTAL CALCIUM) 500 MG chewable tablet Chew 1 tablet by mouth daily as needed for indigestion.     Historical Provider, MD  carvedilol (COREG) 25 MG tablet Take 1 tablet (25 mg total) by mouth 2 (two) times daily with a meal. 07/21/15   Josue Hector, MD  docusate sodium (COLACE) 100 MG capsule Take 1 capsule (100 mg total) by mouth 2 (two) times daily. 07/21/15   Josue Hector, MD  hydrochlorothiazide (MICROZIDE) 12.5 MG capsule Take 1 capsule (12.5 mg total) by mouth daily. 07/21/15   Josue Hector, MD  nitroGLYCERIN (NITROSTAT) 0.4 MG SL tablet Place 1 tablet (0.4 mg total) under the tongue every 5 (five) minutes as needed for chest pain. 01/07/15   Josue Hector, MD  ranitidine (ZANTAC) 150 MG tablet Take 150 mg by mouth daily.  08/12/15   Historical Provider, MD    Family History Family History  Problem Relation Age of Onset  . Pneumonia Mother   . Hip fracture Mother   . Cancer Father     splenic  . Heart disease Sister     Pacer placed  . Cancer Brother     throat and lung  . Cancer Brother     prostate, age 39  . Prostate cancer Brother   . Heart disease Son   . Stroke Neg Hx   . Colon cancer Neg Hx     Social History Social History  Substance Use Topics  . Smoking status: Former Smoker    Packs/day: 1.00    Years: 15.00    Quit date: 05/02/1953  . Smokeless tobacco: Current User    Types: Chew     Comment: Quit smoking over 50 years ago  . Alcohol use No     Allergies   Warfarin and related; Atorvastatin; Diazepam; Ezetimibe; and Penicillins   Review of Systems Review of Systems  All other systems reviewed and are negative.    Physical Exam Updated Vital Signs BP 139/96 (BP Location: Right Arm)   Pulse 89   Temp 97.7 F (36.5 C) (Oral)   Resp 24   SpO2 97%   Physical Exam  Constitutional: He appears well-developed and  well-nourished. No distress.  HENT:  Head: Atraumatic.  Mouth/Throat: Oropharynx is clear and moist.  Eyes: Conjunctivae are normal.  Neck: Neck supple. No thyromegaly present.  Cardiovascular: Normal rate and regular rhythm.   Pulmonary/Chest: Effort normal and breath sounds normal. No stridor.  Abdominal: Soft. Bowel sounds are normal. He exhibits no distension. There is no tenderness.  Neurological: He is alert.  Skin: No rash noted.  Psychiatric: He has a normal mood and affect.  Nursing note and vitals reviewed.    ED Treatments / Results  Labs (all labs ordered are listed, but only abnormal results are displayed) Labs  Reviewed  COMPREHENSIVE METABOLIC PANEL - Abnormal; Notable for the following:       Result Value   Glucose, Bld 119 (*)    BUN 21 (*)    Total Bilirubin 1.6 (*)    GFR calc non Af Amer 55 (*)    All other components within normal limits  CBC - Abnormal; Notable for the following:    WBC 11.7 (*)    All other components within normal limits  LIPASE, BLOOD    EKG  EKG Interpretation None       Radiology Dg Chest 2 View  Result Date: 01/06/2016 CLINICAL DATA:  Difficulty swallowing.  Vomiting. EXAM: CHEST  2 VIEW COMPARISON:  07/02/2014 . FINDINGS: Cardiac pacer noted with lead tip projected over the right ventricle. Heart size normal. Lungs are clear. No pleural effusion or pneumothorax. Biapical pleural thickening noted most likely scarring. Degenerative changes thoracic spine. Lumbar spine fusion. IMPRESSION: 1. Cardiac pacer noted with lead tip projected right ventricle. Heart size normal. 2. No acute pulmonary disease. Electronically Signed   By: Marcello Moores  Register   On: 01/06/2016 17:00    Procedures Procedures (including critical care time)  Medications Ordered in ED Medications  gi cocktail (Maalox,Lidocaine,Donnatal) (30 mLs Oral Given 01/06/16 1707)  glucagon (human recombinant) (GLUCAGEN) injection 1 mg (1 mg Intravenous Given 01/06/16 1734)    ondansetron (ZOFRAN) injection 4 mg (4 mg Intravenous Given 01/06/16 1721)     Initial Impression / Assessment and Plan / ED Course  I have reviewed the triage vital signs and the nursing notes.  Pertinent labs & imaging results that were available during my care of the patient were reviewed by me and considered in my medical decision making (see chart for details).  Clinical Course    BP 145/60   Pulse 83   Temp 98.5 F (36.9 C) (Oral)   Resp 19   SpO2 100%    Final Clinical Impressions(s) / ED Diagnoses   Final diagnoses:  Food impaction of esophagus, initial encounter    New Prescriptions New Prescriptions   PANTOPRAZOLE (PROTONIX) 40 MG TABLET    Take 1 tablet (40 mg total) by mouth daily.   3:36 PM Pt here with globus sensation after having difficulty with swallowing pork BBQ last night.  sts he's unable to pass liquid and solid food since.  He's in NAD, no respiratory sxs and does not have any active pain.  Will perform swallow study, obtain DG esophagus and give GI cocktail. Plan to consult GI.   7:06 PM Pt unable to tolerates swallow screen, GI cocktail, or any liquid.  CXR unremarkable.  Pt in no pain.  3 separate attempt to consult GI specialist but have not have any return call.  Will continue to reach out to our GI specialist.  Pt have received glucagon without improvement.  Zofran given.   7:13 PM Appreciate consultation from [facility] GI specialist Dr. Alessandra Bevels, who will see pt in the ER and will determine disposition.    7:54 PM GI specialist plan to perform EGD in 1 hr. Pt made NPO.  Pt is aware of plan.   12:10 AM GI Specialist were able to perform EGD and successfully exact a food bolus.  Pt felt much better and able to tolerates PO.  He will be discharge with protonix and outpt f/u in office.  Return precaution discussed.    Domenic Moras, PA-C 01/07/16 0012    Duffy Bruce, MD 01/08/16 [phone]

## 2016-01-06 NOTE — ED Notes (Signed)
Pt unable to swallow water with swallow evaluation. Failed study. PA made aware.

## 2016-01-06 NOTE — ED Notes (Signed)
Pt. Requesting drink. EDP made aware. Pt. Instructed that we are waiting for GI consult before he could have a drink.

## 2016-01-06 NOTE — ED Triage Notes (Signed)
Pt had difficulty with swallowing a piece of steak last night.  Since that incident he has been unable to keep anything down he has tried to swallow, including water.  He has not had any of his regular medications since yesterday morning due to his dysphagia and vomiting.  Pt states he has had esophagus stretched several years ago and he was treated for a similar incident after that with a GI Cocktail. Pt takes Zantac daily.

## 2016-01-06 NOTE — ED Provider Notes (Signed)
CSN: [id number]     Arrival date & time 01/06/16  0957 History   First MD Initiated Contact with Patient 01/06/16 1034     Chief Complaint  Patient presents with  . Dysphagia   (Consider location/radiation/quality/duration/timing/severity/associated sxs/prior Treatment) Mr. Chappel is a well-appearing 83 y.o male with history of GERD, HLD, HTN, and Afib, presents today for dysphagia. He ate some meat last night and reports that the meat got stuck in his throat. Since the incident, he is not able to swallow any food or liquid. He vomits with every single food and/or fluid intact. He did vomited several chucks of food and meat last night, therefore the son no longer thinks that Mr. Esty has food impaction. Son would like Mr. Benoit to be treated for GERD, believing that his spitting up and throwing up is due to his GERD. Patient normally takes Zantac for his GERD and is usually well-controlled with Zantac. Patient currently has no nausea, chest pain, or difficulty breathing. He does endorses the feeling something being stuck in his esophagus. Patient reports that he has esophageal stricture but was never treated. Patient frequently gets food stuck in his throat approximately once every 2 months but would usually resolve on it own, however current episode has not resolved yet.      Past Medical History:  Diagnosis Date  . Anxiety   . Back pain, chronic   . Complete heart block ([place]) 07/18/2014   Medtronic [place] model [id number] (serial number [id number])  singe lead PPM  . GERD (gastroesophageal reflux disease) 10/2002  . Hyperlipidemia 12/1994  . Hypertension   . Hypokalemia   . Lung collapse 03/1991   Fall  . Permanent atrial fibrillation ([name])    Refused coumadin therapy   Past Surgical History:  Procedure Laterality Date  . BACK SURGERY  11/91   with hardware fixation  . CARDIAC CATHETERIZATION  06/2004   30 % stenosis, EF normal  . EYE SURGERY     Retinal bubble surgery, cataract   .  LEFT HEART CATHETERIZATION WITH CORONARY ANGIOGRAM N/A 07/18/2014   Procedure: LEFT HEART CATHETERIZATION WITH CORONARY ANGIOGRAM;  Surgeon: Jettie Booze, MD; [facility] [place], LAD mild dz, D1 80%, D2 OK, CFX system OK, RCA OK, PDA 100%, med rx  . PERMANENT PACEMAKER INSERTION N/A 07/18/2014   Procedure: PERMANENT PACEMAKER INSERTION;  Surgeon: Thompson Grayer, MD; Medtronic [place] model [id number] (serial number [id number])    . TEMPORARY PACEMAKER INSERTION N/A 07/18/2014   Procedure: TEMPORARY PACEMAKER INSERTION;  Surgeon: Peter M Martinique, MD;  Location: [facility] CATH LAB;  Service: Cardiovascular;  Laterality: N/A;   Family History  Problem Relation Age of Onset  . Pneumonia Mother   . Hip fracture Mother   . Cancer Father     splenic  . Heart disease Sister     Pacer placed  . Cancer Brother     throat and lung  . Cancer Brother     prostate, age 53  . Prostate cancer Brother   . Heart disease Son   . Stroke Neg Hx   . Colon cancer Neg Hx    Social History  Substance Use Topics  . Smoking status: Former Smoker    Packs/day: 1.00    Years: 15.00    Quit date: 05/02/1953  . Smokeless tobacco: Current User    Types: Chew     Comment: Quit smoking over 50 years ago  . Alcohol use No    Review of Systems  Constitutional: Negative  for chills, fatigue and fever.  Respiratory: Negative for cough, shortness of breath and wheezing.   Cardiovascular: Negative for chest pain and palpitations.  Gastrointestinal: Positive for abdominal pain and vomiting. Negative for nausea.       + Abd pain at the mid abdomen, vomiting with food or liquid intake, +dysphagia     Neurological: Negative for dizziness, weakness and headaches.    Allergies  Warfarin and related; Atorvastatin; Diazepam; Ezetimibe; and Penicillins  Home Medications   Prior to Admission medications   Medication Sig Start Date End Date Taking? Authorizing Provider  ALPRAZolam Duanne Moron) 0.5 MG tablet TAKE ONE-HALF TABLET BY MOUTH  TWICE DAILY AS NEEDED FOR ANXIETY 11/18/15  Yes Tonia Ghent, MD  aspirin 81 MG tablet Take 81 mg by mouth daily.   Yes Historical Provider, MD  calcium carbonate (TUMS - DOSED IN MG ELEMENTAL CALCIUM) 500 MG chewable tablet Chew 1 tablet by mouth daily as needed for indigestion.    Yes Historical Provider, MD  carvedilol (COREG) 25 MG tablet Take 1 tablet (25 mg total) by mouth 2 (two) times daily with a meal. 07/21/15  Yes Josue Hector, MD  docusate sodium (COLACE) 100 MG capsule Take 1 capsule (100 mg total) by mouth 2 (two) times daily. 07/21/15  Yes Josue Hector, MD  hydrochlorothiazide (MICROZIDE) 12.5 MG capsule Take 1 capsule (12.5 mg total) by mouth daily. 07/21/15  Yes Josue Hector, MD  ranitidine (ZANTAC) 150 MG tablet Take 150 mg by mouth daily.  08/12/15  Yes Historical Provider, MD  nitroGLYCERIN (NITROSTAT) 0.4 MG SL tablet Place 1 tablet (0.4 mg total) under the tongue every 5 (five) minutes as needed for chest pain. 01/07/15   Josue Hector, MD   Meds Ordered and Administered this Visit  Medications - No data to display  BP 162/83 (BP Location: Left Arm)   Pulse 76   Temp 97.3 F (36.3 C) (Oral)   SpO2 98%  No data found.   Physical Exam  Constitutional: He is oriented to person, place, and time. He appears well-developed and well-nourished.  HENT:  Head: Normocephalic and atraumatic.  Neck: Normal range of motion. Neck supple. No tracheal deviation present.  Cardiovascular: Normal rate, regular rhythm and normal heart sounds.   Pulmonary/Chest: Effort normal and breath sounds normal.  Abdominal: Soft. Bowel sounds are normal. He exhibits no distension and no mass. There is no tenderness.  Vomited up clear yellow/brown liquid in room  Neurological: He is alert and oriented to person, place, and time.  Skin: Skin is warm and dry.  Nursing note and vitals reviewed.   Urgent Care Course   Clinical Course    Procedures (including critical care time)  Labs  Review Labs Reviewed - No data to display  Imaging Review No results found.     MDM   1. Dysphagia    Patient recommended to go to the Emergency Dept for further evaluation of possible food impaction that is not resolving. Patient's son believes that the food impaction has currently resolved and would like a prescription for acid reflux. Patient has underlying hx of GERD that is normally well-controlled with Zantac alone and this is sudden change from his norm. Son informed that I am concern for continuous food impaction. Son then agrees to take patient to the Emergency Care for further evaluation.    Barry Dienes, NP 01/06/16 1630

## 2016-01-06 NOTE — Anesthesia Preprocedure Evaluation (Deleted)
Anesthesia Evaluation  Patient identified by MRN, date of birth, ID band Patient awake    Reviewed: Allergy & Precautions, H&P , NPO status , Patient's Chart, lab work & pertinent test results  Airway Mallampati: II   Neck ROM: full    Dental   Pulmonary former smoker,    breath sounds clear to auscultation       Cardiovascular hypertension, + CAD and + Past MI  + dysrhythmias Atrial Fibrillation  Rhythm:regular Rate:Normal     Neuro/Psych PSYCHIATRIC DISORDERS Anxiety    GI/Hepatic PUD, GERD  ,  Endo/Other    Renal/GU      Musculoskeletal   Abdominal   Peds  Hematology   Anesthesia Other Findings   Reproductive/Obstetrics                            Anesthesia Physical Anesthesia Plan  ASA: III  Anesthesia Plan: General   Post-op Pain Management:    Induction: Intravenous  Airway Management Planned: Oral ETT  Additional Equipment:   Intra-op Plan:   Post-operative Plan: Extubation in OR  Informed Consent: I have reviewed the patients History and Physical, chart, labs and discussed the procedure including the risks, benefits and alternatives for the proposed anesthesia with the patient or authorized representative who has indicated his/her understanding and acceptance.     Plan Discussed with: CRNA, Anesthesiologist and Surgeon  Anesthesia Plan Comments:         Anesthesia Quick Evaluation

## 2016-01-06 NOTE — ED Notes (Signed)
No response with glucagon. Described a weird feeling, but still feels like something is stuck in his throat.

## 2016-01-06 NOTE — Telephone Encounter (Signed)
New message     Pts son states that the pt is having issues with this throat (swallowing) and wants to know if he should take the pt to a throat doctor. Please advise

## 2016-01-06 NOTE — ED Triage Notes (Signed)
Pt sts had food bolus last night that he vomited up but still have having difficulty getting anything down and vomiting

## 2016-01-06 NOTE — Discharge Instructions (Signed)
Please take Protonix as prescribed and schedule a follow up appointment with [facility] for the next 1-2 weeks for further management.  Follow up with your doctor for further care.  Return if you have any concerns.

## 2016-01-06 NOTE — ED Notes (Signed)
Pt will be going to ED via POV for further evaluation for dysphagia and vomiting.

## 2016-01-07 ENCOUNTER — Ambulatory Visit (INDEPENDENT_AMBULATORY_CARE_PROVIDER_SITE_OTHER): Payer: Medicare Other | Admitting: *Deleted

## 2016-01-07 ENCOUNTER — Encounter (HOSPITAL_COMMUNITY): Payer: Self-pay | Admitting: Gastroenterology

## 2016-01-07 ENCOUNTER — Telehealth: Payer: Self-pay | Admitting: Cardiology

## 2016-01-07 DIAGNOSIS — I442 Atrioventricular block, complete: Secondary | ICD-10-CM | POA: Diagnosis not present

## 2016-01-07 NOTE — Progress Notes (Signed)
Remote pacemaker transmission.   

## 2016-01-07 NOTE — Telephone Encounter (Signed)
Spoke with pt and reminded pt of remote transmission that is due today. Pt verbalized understanding.   

## 2016-01-11 ENCOUNTER — Encounter: Payer: Self-pay | Admitting: Cardiology

## 2016-01-19 ENCOUNTER — Encounter: Payer: Self-pay | Admitting: Cardiovascular Disease

## 2016-01-19 LAB — CUP PACEART REMOTE DEVICE CHECK
Battery Remaining Longevity: 101 mo
Implantable Lead Implant Date: 20160318
Lead Channel Pacing Threshold Amplitude: 0.75 V
Lead Channel Pacing Threshold Pulse Width: 0.4 ms
Lead Channel Setting Pacing Amplitude: 2.5 V
MDC IDC LEAD LOCATION: 753860
MDC IDC MSMT BATTERY IMPEDANCE: 251 Ohm
MDC IDC MSMT BATTERY VOLTAGE: 2.77 V
MDC IDC MSMT LEADCHNL RA IMPEDANCE VALUE: 0 Ohm
MDC IDC MSMT LEADCHNL RV IMPEDANCE VALUE: 530 Ohm
MDC IDC SESS DTM: 20170907141649
MDC IDC SET LEADCHNL RV PACING PULSEWIDTH: 0.4 ms
MDC IDC SET LEADCHNL RV SENSING SENSITIVITY: 2 mV
MDC IDC STAT BRADY RV PERCENT PACED: 69 %

## 2016-01-26 NOTE — Progress Notes (Signed)
Patient ID: Dakota [name], male   DOB: 1928-07-18, 82 y.o.   MRN: [id number]     Cardiology Office Note   Date:  02/01/2016   ID:  Dakota [name], DOB 03/03/1929, MRN [id number]  PCP:  [name], MD  Cardiologist:  Dr [name], MD   Chief Complaint  Patient presents with  . Permanent atrial fibrillation ([place])  . Complete Heart Block    History of Present Illness: Dakota [name] is seen today for f/U of afib and HTN. Marland KitchenHe has chronic afib and has refused to be on coumadin. Forutuanatly he has not had a CVA, TIA or embolic event. His BP is under good control. I discussed Pradaxa with him and he still wishes to be just on asa since he feels so well I encouraged him to F/U with Dr Rodena Piety as his vision is still a problem Wife has bad back problems with parkinsons and is essentially total care. Encouraged him to look into assisted living for her He tried this for 2 months but didn't think they looked after her well   07/26/14 D/C after SEMI with PDA occlusion.  Symptomatic CHB with pacer implant  07/18/14  Echo reviewed EF 55% no significant valve disease   Seen by PA 08/13/14   for evaluation of paresthesias of LE, weakness, and failure to increase his activity. Plavix stopped that visit Much better off this med   Discussed issues with previous statin Long time ago indicates losing weight and weakness details not clear   Doing well.  Need laxative and stool softener.  Some LE edema Hearing a bit worse Some dysphagia to solid foods with "choking"  01/06/16 EGD to remove food impaction with esophagitis and Schatzki ring Needs f/u EGD   Past Medical History:  Diagnosis Date  . Anxiety   . Back pain, chronic   . Complete heart block ([place]) 07/18/2014   Medtronic [place] model [id number] (serial number [id number])  singe lead PPM  . GERD (gastroesophageal reflux disease) 10/2002  . Hyperlipidemia 12/1994  . Hypertension   . Hypokalemia   . Lung collapse 03/1991   Fall  . Permanent atrial  fibrillation ([name])    Refused coumadin therapy   Past Surgical History:  Procedure Laterality Date  . BACK SURGERY  11/91   with hardware fixation  . CARDIAC CATHETERIZATION  06/2004   30 % stenosis, EF normal  . ESOPHAGOGASTRODUODENOSCOPY N/A 01/06/2016   Procedure: ESOPHAGOGASTRODUODENOSCOPY (EGD);  Surgeon: Otis Brace, MD;  Location: [facility];  Service: Gastroenterology;  Laterality: N/A;  . EYE SURGERY     Retinal bubble surgery, cataract   . LEFT HEART CATHETERIZATION WITH CORONARY ANGIOGRAM N/A 07/18/2014   Procedure: LEFT HEART CATHETERIZATION WITH CORONARY ANGIOGRAM;  Surgeon: Jettie Booze, MD; [facility] [place], LAD mild dz, D1 80%, D2 OK, CFX system OK, RCA OK, PDA 100%, med rx  . PERMANENT PACEMAKER INSERTION N/A 07/18/2014   Procedure: PERMANENT PACEMAKER INSERTION;  Surgeon: Thompson Grayer, MD; Medtronic [place] model [id number] (serial number [id number])    . TEMPORARY PACEMAKER INSERTION N/A 07/18/2014   Procedure: TEMPORARY PACEMAKER INSERTION;  Surgeon: Peter M Martinique, MD;  Location: [facility] CATH LAB;  Service: Cardiovascular;  Laterality: N/A;    Current Outpatient Prescriptions  Medication Sig Dispense Refill  . ALPRAZolam (XANAX) 0.5 MG tablet TAKE ONE-HALF TABLET BY MOUTH TWICE DAILY AS NEEDED FOR ANXIETY 60 tablet 1  . aspirin 81 MG tablet Take 81 mg by mouth daily.    . calcium carbonate (  TUMS - DOSED IN MG ELEMENTAL CALCIUM) 500 MG chewable tablet Chew 1 tablet by mouth daily as needed for indigestion.     . carvedilol (COREG) 25 MG tablet Take 1 tablet (25 mg total) by mouth 2 (two) times daily with a meal. 180 tablet 3  . docusate sodium (COLACE) 100 MG capsule Take 1 capsule (100 mg total) by mouth 2 (two) times daily. 180 capsule 3  . hydrochlorothiazide (MICROZIDE) 12.5 MG capsule Take 1 capsule (12.5 mg total) by mouth daily. 90 capsule 3  . nitroGLYCERIN (NITROSTAT) 0.4 MG SL tablet Place 1 tablet (0.4 mg total) under the tongue every 5 (five) minutes as needed for  chest pain. 25 tablet 3  . pantoprazole (PROTONIX) 40 MG tablet Take 1 tablet (40 mg total) by mouth daily. 30 tablet 0  . ranitidine (ZANTAC) 150 MG tablet Take 150 mg by mouth daily.      No current facility-administered medications for this visit.     Allergies:   Warfarin and related; Atorvastatin; Ezetimibe; Diazepam; and Penicillins    Social History:  The patient  reports that he quit smoking about 62 years ago. He has a 15.00 pack-year smoking history. His smokeless tobacco use includes Chew. He reports that he does not drink alcohol or use drugs.   Family History:  The patient's family history includes Cancer in his brother, brother, and father; Heart disease in his sister and son; Hip fracture in his mother; Pneumonia in his mother; Prostate cancer in his brother.    ROS:  Please see the history of present illness. All other systems are reviewed and negative.    PHYSICAL EXAM: VS:  BP 98/60   Pulse 82   Ht 5\' 6"  (1.676 m)   Wt 88.4 kg (194 lb 12.8 oz)   SpO2 95%   BMI 31.44 kg/m  , BMI Body mass index is 31.44 kg/m. GEN: Well nourished, well developed, in no acute distress  HEENT: normal  Neck: no JVD, carotid bruits, or masses Cardiac: RRR; no murmurs, rubs, or gallops,no edema  Pacer under left clavicle  Respiratory:  Scattered rales bilaterally, normal work of breathing, upper airway congestion, improves with cough and throat clearing GI: soft, nontender, nondistended, + BS MS: no deformity or atrophy  Skin: warm and dry, no rash Neuro:  Strength and sensation are intact Psych: euthymic mood, full affect Plus one bilateral LE edema    EKG:   08/13/14   atrial fib, V pacing with 2 intrinsic beats. Of note, his heart rate increased to 115 with ambulation; oxygen saturation remained greater than 95%   Recent Labs: 01/06/2016: ALT 17; BUN 21; Creatinine, Ser 1.17; Hemoglobin 16.3; Platelets 215; Potassium 5.0; Sodium 139   Lipid Panel    Component Value  Date/Time   CHOL 199 10/05/2015 1321   TRIG 129.0 10/05/2015 1321   HDL 35.30 (L) 10/05/2015 1321   CHOLHDL 6 10/05/2015 1321   VLDL 25.8 10/05/2015 1321   LDLCALC 138 (H) 10/05/2015 1321   LDLDIRECT 155.7 10/19/2012 0851     Wt Readings from Last 3 Encounters:  02/01/16 88.4 kg (194 lb 12.8 oz)  10/08/15 87.7 kg (193 lb 6.4 oz)  10/05/15 88.3 kg (194 lb 12 oz)     Other studies Reviewed: Additional studies/ records that were reviewed today include: recent hospital records.  ASSESSMENT AND PLAN:  1.  Lower extremity Edema:  PRN HCTZ 12.5 mg  Called in   2. 07/26/14  non-STEMI, medical therapy for  occluded PDA: The patient is not having any ischemic symptoms. EF was 55% by echocardiogram. Continue beta blocker and aspirin. Plavix stopped due to side effects   3. Hypertension: Mr. Kretzer has been on carvedilol 25 mg twice a day for a long time. His blood pressure is well controlled. It is unclear if his blood pressure is significantly lower than it was prior to his recent event Home readings are fine according to son  4. Constipation:  Discussed hydration fiber and colace 100 bid called in   5. Hypokalemia: resovled   6. Hyperglycemia: He has a history of hyperglycemia and his blood sugar was 297 on admission to the hospital. He has no history of diabetes. The BMET will check his blood sugar and if it is significantly elevated, he will need to follow-up with his primary care physician.  7. Pacer :  Normal function single lead MDT implant 2016 CHB Continue remote transmissions f/u [facility] in spring   8. Chol:  Check labs 3 months and consider starting statin   9. GI:  F/u EGD November post food impaction and esophagitis Continue protonix has f/u with [facility] this month   10/  BS  A1c   Will recheck f/u primary to see if he needs Rx for BS  Lab Results  Component Value Date   HGBA1C 6.3 10/05/2015      Current medicines are reviewed at length with the patient today.   The patient has concerns regarding medicines.  The following changes have been made: None  Labs/ tests ordered today include:  A1c   No orders of the defined types were placed in this encounter.    Disposition:   FU with me in 6 months   Signed, Jenkins Rouge, MD  02/01/2016 10:05 AM    [address], [place], [place]  [address] Phone: [phone]; Fax: [phone]

## 2016-01-29 ENCOUNTER — Ambulatory Visit: Payer: Medicare Other | Admitting: Cardiovascular Disease

## 2016-02-01 ENCOUNTER — Ambulatory Visit (INDEPENDENT_AMBULATORY_CARE_PROVIDER_SITE_OTHER): Payer: Medicare Other | Admitting: Cardiovascular Disease

## 2016-02-01 ENCOUNTER — Encounter: Payer: Self-pay | Admitting: Cardiovascular Disease

## 2016-02-01 VITALS — BP 98/60 | HR 82 | Ht 66.0 in | Wt 194.8 lb

## 2016-02-01 DIAGNOSIS — R739 Hyperglycemia, unspecified: Secondary | ICD-10-CM | POA: Diagnosis not present

## 2016-02-01 DIAGNOSIS — I482 Chronic atrial fibrillation: Secondary | ICD-10-CM | POA: Diagnosis not present

## 2016-02-01 DIAGNOSIS — I4821 Permanent atrial fibrillation: Secondary | ICD-10-CM

## 2016-02-01 DIAGNOSIS — I442 Atrioventricular block, complete: Secondary | ICD-10-CM

## 2016-02-01 NOTE — Patient Instructions (Addendum)
Medication Instructions:  Your physician recommends that you continue on your current medications as directed. Please refer to the Current Medication list given to you today.  Labwork: NONE  Testing/Procedures: Your physician recommends that you have lab work today- HgbA1c   Follow-Up: Your physician wants you to follow-up in: 6 months with Dr. Johnsie Cancel. You will receive a reminder letter in the mail two months in advance. If you don't receive a letter, please call our office to schedule the follow-up appointment.   If you need a refill on your cardiac medications before your next appointment, please call your pharmacy.

## 2016-02-02 LAB — HEMOGLOBIN A1C
Hgb A1c MFr Bld: 6 % — ABNORMAL HIGH (ref <5.7)
MEAN PLASMA GLUCOSE: 126 mg/dL

## 2016-02-09 DIAGNOSIS — K209 Esophagitis, unspecified: Secondary | ICD-10-CM | POA: Diagnosis not present

## 2016-02-09 DIAGNOSIS — I4891 Unspecified atrial fibrillation: Secondary | ICD-10-CM | POA: Diagnosis not present

## 2016-02-09 DIAGNOSIS — T18128D Food in esophagus causing other injury, subsequent encounter: Secondary | ICD-10-CM | POA: Diagnosis not present

## 2016-03-28 ENCOUNTER — Other Ambulatory Visit: Payer: Self-pay | Admitting: Family Medicine

## 2016-03-28 NOTE — Telephone Encounter (Signed)
Electronic refill request. Last Filled:    60 tablet 1 11/18/2015  Last office visit:   CPE 10/05/15  Please advise.

## 2016-03-29 NOTE — Telephone Encounter (Signed)
Please call in.  Thanks.   

## 2016-03-29 NOTE — Telephone Encounter (Signed)
Medication phoned to pharmacy.  

## 2016-04-07 ENCOUNTER — Telehealth: Payer: Self-pay | Admitting: *Deleted

## 2016-04-07 ENCOUNTER — Ambulatory Visit (INDEPENDENT_AMBULATORY_CARE_PROVIDER_SITE_OTHER): Payer: Medicare Other | Admitting: *Deleted

## 2016-04-07 DIAGNOSIS — I442 Atrioventricular block, complete: Secondary | ICD-10-CM

## 2016-04-07 NOTE — Progress Notes (Signed)
Remote pacemaker transmission.   

## 2016-04-07 NOTE — Telephone Encounter (Signed)
Informed pt to we received his remote transmission. Pt verbalized understanding.

## 2016-04-07 NOTE — Telephone Encounter (Signed)
Dakota [name] is calling to find out if you received his transmission . Please call

## 2016-04-13 ENCOUNTER — Encounter: Payer: Self-pay | Admitting: Cardiology

## 2016-05-04 LAB — CUP PACEART REMOTE DEVICE CHECK
Battery Remaining Longevity: 95 mo
Battery Voltage: 2.77 V
Date Time Interrogation Session: 20171207143128
Implantable Lead Location: 753860
Implantable Lead Model: 5076
Implantable Pulse Generator Implant Date: 20160318
Lead Channel Pacing Threshold Pulse Width: 0.4 ms
MDC IDC LEAD IMPLANT DT: 20160318
MDC IDC MSMT BATTERY IMPEDANCE: 300 Ohm
MDC IDC MSMT LEADCHNL RA IMPEDANCE VALUE: 0 Ohm
MDC IDC MSMT LEADCHNL RV IMPEDANCE VALUE: 457 Ohm
MDC IDC MSMT LEADCHNL RV PACING THRESHOLD AMPLITUDE: 0.75 V
MDC IDC SET LEADCHNL RV PACING AMPLITUDE: 2.5 V
MDC IDC SET LEADCHNL RV PACING PULSEWIDTH: 0.4 ms
MDC IDC SET LEADCHNL RV SENSING SENSITIVITY: 2 mV
MDC IDC STAT BRADY RV PERCENT PACED: 69 %

## 2016-05-23 ENCOUNTER — Ambulatory Visit (INDEPENDENT_AMBULATORY_CARE_PROVIDER_SITE_OTHER): Payer: Medicare Other | Admitting: Family Medicine

## 2016-05-23 ENCOUNTER — Encounter: Payer: Self-pay | Admitting: Family Medicine

## 2016-05-23 VITALS — BP 110/60 | HR 72 | Temp 98.7°F | Wt 190.2 lb

## 2016-05-23 DIAGNOSIS — R319 Hematuria, unspecified: Secondary | ICD-10-CM | POA: Diagnosis not present

## 2016-05-23 DIAGNOSIS — Z87448 Personal history of other diseases of urinary system: Secondary | ICD-10-CM | POA: Diagnosis not present

## 2016-05-23 LAB — URINALYSIS, MICROSCOPIC ONLY

## 2016-05-23 NOTE — Patient Instructions (Signed)
Urine sample on the way out today.  If you can't get a sample done, then take a sterile cup home and then drop it off later.  I still want you to see urology in the meantime.  Rosaria Ferries will call about your referral. Take care.  Glad to see you.

## 2016-05-23 NOTE — Progress Notes (Signed)
Urine bloody back in 2016 on xarelto.  He didn't have f/u with uro after recheck u/a in 12/2015.  He was called and a letter was sent to patient about the results.  D/w pt today again about prev lab and w/u today.  D/w pt about f/u with urology, asked patient to set up on the way out today.    He has been leaking some urine recently, worse recently.  No visible blood in urine.  No burning with urination now.    No fevers.  Still on 81mg  ASA, but no other blood thinners currently.    Meds, vitals, and allergies reviewed.   ROS: Per HPI unless specifically indicated in ROS section   GEN: nad, alert and oriented HEENT: mucous membranes moist NECK: supple w/o LA CV: rrr. PULM: ctab, no inc wob ABD: soft, +bs EXT: no edema DRE deferred.

## 2016-05-23 NOTE — Assessment & Plan Note (Signed)
Again noted, refer to uro.  See notes on labs.

## 2016-05-23 NOTE — Progress Notes (Signed)
Pre visit review using our clinic review tool, if applicable. No additional management support is needed unless otherwise documented below in the visit note. 

## 2016-05-25 ENCOUNTER — Encounter: Payer: Self-pay | Admitting: *Deleted

## 2016-06-07 ENCOUNTER — Encounter: Payer: Self-pay | Admitting: *Deleted

## 2016-06-07 DIAGNOSIS — R3121 Asymptomatic microscopic hematuria: Secondary | ICD-10-CM | POA: Diagnosis not present

## 2016-07-05 NOTE — Progress Notes (Signed)
Patient ID: Dakota [name], male   DOB: 1928/11/16, 83 y.o.   MRN: [id number]     Cardiology Office Note   Date:  07/18/2016   ID:  Dakota [name], DOB 1928/11/24, MRN [id number]  PCP:  [name], MD  Cardiologist:  Dr [name], MD   Chief Complaint  Patient presents with  . Follow-up    History of Present Illness: Dakota [name] is seen today for f/U of afib and HTN. Marland KitchenHe has chronic afib and has refused to be on coumadin. Forutuanatly he has not had a CVA, TIA or embolic event. His BP is under good control. I discussed Pradaxa with him and he still wishes to be just on asa since he feels so well I encouraged him to F/U with Dr Rodena Piety as his vision is still a problem Wife has bad back problems with parkinsons and is essentially total care. Encouraged him to look into assisted living for her He tried this for 2 months but didn't think they looked after her well   07/26/14 D/C after SEMI with PDA occlusion.  Symptomatic CHB with pacer implant  07/18/14  Echo reviewed EF 55% no significant valve disease   Seen by PA 08/13/14   for evaluation of paresthesias of LE, weakness, and failure to increase his activity. Plavix stopped that visit Much better off this med   Discussed issues with previous statin Long time ago indicates losing weight and weakness details not clear   Doing well.  Need laxative and stool softener.  Some LE edema Hearing a bit worse   01/06/16 EGD to remove food impaction with esophagitis and Schatzki ring    Has to take zantac bid or gets "acid"     Past Medical History:  Diagnosis Date  . Anxiety   . Back pain, chronic   . Complete heart block ([place]) 07/18/2014   Medtronic [place] model [id number] (serial number [id number])  singe lead PPM  . GERD (gastroesophageal reflux disease) 10/2002  . Hyperlipidemia 12/1994  . Hypertension   . Hypokalemia   . Lung collapse 03/1991   Fall  . Permanent atrial fibrillation ([name])    Refused coumadin therapy   Past  Surgical History:  Procedure Laterality Date  . BACK SURGERY  11/91   with hardware fixation  . CARDIAC CATHETERIZATION  06/2004   30 % stenosis, EF normal  . ESOPHAGOGASTRODUODENOSCOPY N/A 01/06/2016   Procedure: ESOPHAGOGASTRODUODENOSCOPY (EGD);  Surgeon: Otis Brace, MD;  Location: [facility];  Service: Gastroenterology;  Laterality: N/A;  . EYE SURGERY     Retinal bubble surgery, cataract   . LEFT HEART CATHETERIZATION WITH CORONARY ANGIOGRAM N/A 07/18/2014   Procedure: LEFT HEART CATHETERIZATION WITH CORONARY ANGIOGRAM;  Surgeon: Jettie Booze, MD; [facility] [place], LAD mild dz, D1 80%, D2 OK, CFX system OK, RCA OK, PDA 100%, med rx  . PERMANENT PACEMAKER INSERTION N/A 07/18/2014   Procedure: PERMANENT PACEMAKER INSERTION;  Surgeon: Thompson Grayer, MD; Medtronic [place] model [phone] (serial number [id number])    . TEMPORARY PACEMAKER INSERTION N/A 07/18/2014   Procedure: TEMPORARY PACEMAKER INSERTION;  Surgeon: Carlotta Telfair M Martinique, MD;  [facility] C[facility] LAB;  Service: Cardiovascular;  Laterality: N/A;    Current Outpatient Prescriptions  Medication Sig Dispense Refill  . ALPRAZolam (XANAX) 0.5 MG tablet TAKE ONE-HALF TABLET BY MOUTH TWICE DAILY AS NEEDED FOR ANXIETY 60 tablet 1  . aspirin 81 MG tablet Take 81 mg by mouth 2 (two) times daily.     . calcium carbonate (  TUMS - DOSED IN MG ELEMENTAL CALCIUM) 500 MG chewable tablet Chew 1 tablet by mouth daily as needed for indigestion.     . carvedilol (COREG) 25 MG tablet Take 1 tablet (25 mg total) by mouth 2 (two) times daily with a meal. 180 tablet 3  . hydrochlorothiazide (MICROZIDE) 12.5 MG capsule Take 12.5 mg by mouth daily as needed (swelling).    . magnesium hydroxide (MILK OF MAGNESIA) 400 MG/5ML suspension Take by mouth daily as needed for mild constipation.    . nitroGLYCERIN (NITROSTAT) 0.4 MG SL tablet Place 1 tablet (0.4 mg total) under the tongue every 5 (five) minutes as needed for chest pain. 25 tablet 3  . pantoprazole  (PROTONIX) 40 MG tablet Take 1 tablet (40 mg total) by mouth daily. 30 tablet 0  . ranitidine (ZANTAC) 150 MG tablet Take 150 mg by mouth daily as needed.      No current facility-administered medications for this visit.     Allergies:   Warfarin and related; Atorvastatin; Ezetimibe; Diazepam; and Penicillins    Social History:  The patient  reports that he quit smoking about 63 years ago. He has a 15.00 pack-year smoking history. His smokeless tobacco use includes Chew. He reports that he does not drink alcohol or use drugs.   Family History:  The patient's family history includes Cancer in his brother, brother, and father; Heart disease in his sister and son; Hip fracture in his mother; Pneumonia in his mother; Prostate cancer in his brother.    ROS:  Please see the history of present illness. All other systems are reviewed and negative.    PHYSICAL EXAM: VS:  BP 126/70   Pulse 76   Ht 5\' 8"  (1.727 m)   Wt 191 lb 12.8 oz (87 kg)   BMI 29.16 kg/m  , BMI Body mass index is 29.16 kg/m. GEN: Well nourished, well developed, in no acute distress  HEENT: normal  Neck: no JVD, carotid bruits, or masses Cardiac: RRR; no murmurs, rubs, or gallops,no edema  Pacer under left clavicle  Respiratory:  Scattered rales bilaterally, normal work of breathing, upper airway congestion, improves with cough and throat clearing GI: soft, nontender, nondistended, + BS MS: no deformity or atrophy  Skin: warm and dry, no rash Neuro:  Strength and sensation are intact Psych: euthymic mood, full affect Plus one bilateral LE edema    EKG:   08/13/14   atrial fib, V pacing with 2 intrinsic beats. Of note, his heart rate increased to 115 with ambulation; oxygen saturation remained greater than 95%   Recent Labs: 01/06/2016: ALT 17; BUN 21; Creatinine, Ser 1.17; Hemoglobin 16.3; Platelets 215; Potassium 5.0; Sodium 139   Lipid Panel    Component Value Date/Time   CHOL 199 10/05/2015 1321   TRIG 129.0  10/05/2015 1321   HDL 35.30 (L) 10/05/2015 1321   CHOLHDL 6 10/05/2015 1321   VLDL 25.8 10/05/2015 1321   LDLCALC 138 (H) 10/05/2015 1321   LDLDIRECT 155.7 10/19/2012 0851     Wt Readings from Last 3 Encounters:  07/18/16 191 lb 12.8 oz (87 kg)  05/23/16 190 lb 4 oz (86.3 kg)  02/01/16 194 lb 12.8 oz (88.4 kg)     Other studies Reviewed: Additional studies/ records that were reviewed today include: recent hospital records.  ASSESSMENT AND PLAN:  1.  Lower extremity Edema:  PRN HCTZ 12.5 mg  refilled  2. 07/26/14  non-STEMI, medical therapy for occluded PDA: The patient is not having  any ischemic symptoms. EF was 55% by echocardiogram. Continue beta blocker and aspirin. Plavix stopped due to side effects   3. Hypertension: Dakota [name] has been on carvedilol 25 mg twice a day for a long time. His blood pressure is well controlled.  4. Constipation:  Discussed hydration fiber and colace 100 bid called in   5. Hypokalemia: resovled   6. DM:  Discussed low carb diet.  Target hemoglobin A1c is 6.5 or less.  Continue current medications.  Lab Results  Component Value Date   HGBA1C 6.0 (H) 02/01/2016     7. Pacer :  Normal function single lead MDT implant 2016 CHB Continue remote transmissions f/u Se[place]/Allred in spring  Set VVIR due to afib   8. Chol:  Check labs 3 months and consider starting statin   9. GI:  Schatzki ring with food impaction EGD 01/06/16 with esophagitis Continue protonix has f/u with [facility]    10:  Afib:  Rate control is fine patient declines anticoagulation   Current medicines are reviewed at length with the patient today.  The patient has concerns regarding medicines.  The following changes have been made: None  Labs/ tests ordered today include:  None   No orders of the defined types were placed in this encounter.    Disposition:   FU with me in 6 months   Signed, Jenkins Rouge, MD  07/18/2016 2:46 PM    [facility] [place], [place], [place]  [address] Phone: [phone]; Fax: [phone]

## 2016-07-07 ENCOUNTER — Ambulatory Visit (INDEPENDENT_AMBULATORY_CARE_PROVIDER_SITE_OTHER): Payer: Medicare Other | Admitting: *Deleted

## 2016-07-07 DIAGNOSIS — I442 Atrioventricular block, complete: Secondary | ICD-10-CM | POA: Diagnosis not present

## 2016-07-07 NOTE — Progress Notes (Signed)
Remote pacemaker transmission.   

## 2016-07-08 ENCOUNTER — Encounter: Payer: Self-pay | Admitting: Cardiology

## 2016-07-08 LAB — CUP PACEART REMOTE DEVICE CHECK
Battery Impedance: 348 Ohm
Battery Remaining Longevity: 93 mo
Brady Statistic RV Percent Paced: 73 %
Date Time Interrogation Session: 20180308135201
Implantable Lead Model: 5076
Implantable Pulse Generator Implant Date: 20160318
Lead Channel Impedance Value: 514 Ohm
Lead Channel Pacing Threshold Amplitude: 0.75 V
Lead Channel Setting Pacing Amplitude: 2.5 V
Lead Channel Setting Pacing Pulse Width: 0.4 ms
Lead Channel Setting Sensing Sensitivity: 2 mV
MDC IDC LEAD IMPLANT DT: 20160318
MDC IDC LEAD LOCATION: 753860
MDC IDC MSMT BATTERY VOLTAGE: 2.77 V
MDC IDC MSMT LEADCHNL RA IMPEDANCE VALUE: 0 Ohm
MDC IDC MSMT LEADCHNL RV PACING THRESHOLD PULSEWIDTH: 0.4 ms

## 2016-07-18 ENCOUNTER — Encounter: Payer: Self-pay | Admitting: Cardiovascular Disease

## 2016-07-18 ENCOUNTER — Ambulatory Visit (INDEPENDENT_AMBULATORY_CARE_PROVIDER_SITE_OTHER): Payer: Medicare Other | Admitting: Cardiovascular Disease

## 2016-07-18 VITALS — BP 126/70 | HR 76 | Ht 68.0 in | Wt 191.8 lb

## 2016-07-18 DIAGNOSIS — I482 Chronic atrial fibrillation, unspecified: Secondary | ICD-10-CM

## 2016-07-18 NOTE — Patient Instructions (Addendum)
Medication Instructions:  Your physician recommends that you continue on your current medications as directed. Please refer to the Current Medication list given to you today.  Labwork: NONE  Testing/Procedures: NONE  Follow-Up: Your physician recommends that you schedule a follow-up appointment in June with EP Carlynn Herald NP) to check pacemaker.   Your physician wants you to follow-up in: 6 months with Dr. Johnsie Cancel. You will receive a reminder letter in the mail two months in advance. If you don't receive a letter, please call our office to schedule the follow-up appointment.   If you need a refill on your cardiac medications before your next appointment, please call your pharmacy.

## 2016-08-15 ENCOUNTER — Other Ambulatory Visit: Payer: Self-pay | Admitting: Cardiovascular Disease

## 2016-08-15 ENCOUNTER — Other Ambulatory Visit: Payer: Self-pay | Admitting: Family Medicine

## 2016-08-15 NOTE — Telephone Encounter (Signed)
Electronic refill request. Last office visit:   05/23/2016 Last Filled:    60 tablet 1 03/29/2016  Please advise.

## 2016-08-16 NOTE — Telephone Encounter (Signed)
Rx called to pharmacy as instructed. 

## 2016-08-16 NOTE — Telephone Encounter (Signed)
Please call in.  Thanks.   

## 2016-10-23 NOTE — Progress Notes (Signed)
Electrophysiology Office Note Date: 10/27/2016  ID:  Dakota [name], DOB 07/27/1928, MRN [id number]  PCP: Tonia Ghent, MD Primary Cardiologist: Johnsie Cancel Electrophysiologist: Allred  CC: Pacemaker follow-up  Dakota [name] is a 83 y.o. male seen today for Dr Rayann Heman.  He presents today for routine electrophysiology followup.  Since last being seen in our clinic, the patient reports doing reasonably well. He is remaining active at home. He has rare chest pain that is more 2/2 reflux and is relieved with Tums.   He denies  palpitations, dyspnea, PND, orthopnea, nausea, vomiting, dizziness, syncope, weight gain, or early satiety. He cares for his wife who is homebound.  Device History: MDT single chamber PPM implanted 2016 for complete heart block    Past Medical History:  Diagnosis Date  . Anxiety   . Back pain, chronic   . Complete heart block ([place]) 07/18/2014   Medtronic [place] model [id number] (serial number [id number])  singe lead PPM  . GERD (gastroesophageal reflux disease) 10/2002  . Hyperlipidemia 12/1994  . Hypertension   . Hypokalemia   . Lung collapse 03/1991   Fall  . Permanent atrial fibrillation ([name])    Refused coumadin therapy   Past Surgical History:  Procedure Laterality Date  . BACK SURGERY  11/91   with hardware fixation  . CARDIAC CATHETERIZATION  06/2004   30 % stenosis, EF normal  . ESOPHAGOGASTRODUODENOSCOPY N/A 01/06/2016   Procedure: ESOPHAGOGASTRODUODENOSCOPY (EGD);  Surgeon: Otis Brace, MD;  Location: [facility];  Service: Gastroenterology;  Laterality: N/A;  . EYE SURGERY     Retinal bubble surgery, cataract   . LEFT HEART CATHETERIZATION WITH CORONARY ANGIOGRAM N/A 07/18/2014   Procedure: LEFT HEART CATHETERIZATION WITH CORONARY ANGIOGRAM;  Surgeon: Jettie Booze, MD; [facility] [place], LAD mild dz, D1 80%, D2 OK, CFX system OK, RCA OK, PDA 100%, med rx  . PERMANENT PACEMAKER INSERTION N/A 07/18/2014   Procedure: PERMANENT PACEMAKER INSERTION;   Surgeon: Thompson Grayer, MD; Medtronic [place] model [id number] (serial number [id number])    . TEMPORARY PACEMAKER INSERTION N/A 07/18/2014   Procedure: TEMPORARY PACEMAKER INSERTION;  Surgeon: Peter M Martinique, MD;  Location: [facility] CATH LAB;  Service: Cardiovascular;  Laterality: N/A;    Current Outpatient Prescriptions  Medication Sig Dispense Refill  . ALPRAZolam (XANAX) 0.5 MG tablet TAKE ONE-HALF TABLET BY MOUTH TWICE DAILY AS NEEDED FOR ANXIETY 60 tablet 1  . aspirin 81 MG tablet Take 81 mg by mouth 2 (two) times daily.     . calcium carbonate (TUMS - DOSED IN MG ELEMENTAL CALCIUM) 500 MG chewable tablet Chew 1 tablet by mouth daily as needed for indigestion.     . carvedilol (COREG) 25 MG tablet TAKE ONE TABLET BY MOUTH TWICE DAILY WITH A MEAL 180 tablet 1  . hydrochlorothiazide (MICROZIDE) 12.5 MG capsule Take 12.5 mg by mouth daily as needed (swelling).    . magnesium hydroxide (MILK OF MAGNESIA) 400 MG/5ML suspension Take by mouth daily as needed for mild constipation.    . nitroGLYCERIN (NITROSTAT) 0.4 MG SL tablet Place 1 tablet (0.4 mg total) under the tongue every 5 (five) minutes as needed for chest pain. 25 tablet 3  . pantoprazole (PROTONIX) 40 MG tablet Take 1 tablet (40 mg total) by mouth daily. 30 tablet 0  . ranitidine (ZANTAC) 150 MG tablet TAKE ONE TABLET BY MOUTH ONCE DAILY 90 tablet 1   No current facility-administered medications for this visit.     Allergies:   Warfarin and related; Atorvastatin;  Ezetimibe; Diazepam; and Penicillins   Social History: Social History   Social History  . Marital status: Married    Spouse name: N/A  . Number of children: 4  . Years of education: N/A   Occupational History  . retired     Architect   Social History Main Topics  . Smoking status: Former Smoker    Packs/day: 1.00    Years: 15.00    Quit date: 05/02/1953  . Smokeless tobacco: Current User    Types: Chew     Comment: Quit smoking over 50 years ago  . Alcohol use No    . Drug use: No  . Sexual activity: No   Other Topics Concern  . Not on file   Social History Narrative   Retired from [employer], building/construction   Married 1953   4 kids   Caring for his wife at home- source of anxiety for patient    Family History: Family History  Problem Relation Age of Onset  . Pneumonia Mother   . Hip fracture Mother   . Cancer Father        splenic  . Heart disease Sister        Pacer placed  . Cancer Brother        throat and lung  . Cancer Brother        prostate, age 4  . Prostate cancer Brother   . Heart disease Son   . Stroke Neg Hx   . Colon cancer Neg Hx      Review of Systems: All other systems reviewed and are otherwise negative except as noted above.   Physical Exam: VS:  BP 134/62   Pulse 79   Ht 5\' 8"  (1.727 m)   Wt 196 lb (88.9 kg)   BMI 29.80 kg/m  , BMI Body mass index is 29.8 kg/m.  GEN- The patient is elderly appearing, alert and oriented x 3 today.   HEENT: normocephalic, atraumatic; sclera clear, conjunctiva pink; hearing intact; oropharynx clear; neck supple  Lungs- Clear to ausculation bilaterally, normal work of breathing.  No wheezes, rales, rhonchi Heart- Regular rate and rhythm (paced) GI- soft, non-tender, non-distended, bowel sounds present  Extremities- no clubbing, cyanosis, or edema  MS- no significant deformity or atrophy Skin- warm and dry, no rash or lesion; PPM pocket well healed Psych- euthymic mood, full affect Neuro- strength and sensation are intact  PPM Interrogation- reviewed in detail today,  See PACEART report  EKG:  EKG is ordered today. The ekg ordered today shows AF with V pacing   Recent Labs: 01/06/2016: ALT 17; BUN 21; Creatinine, Ser 1.17; Hemoglobin 16.3; Platelets 215; Potassium 5.0; Sodium 139   Wt Readings from Last 3 Encounters:  10/27/16 196 lb (88.9 kg)  07/18/16 191 lb 12.8 oz (87 kg)  05/23/16 190 lb 4 oz (86.3 kg)     Other studies Reviewed: Additional  studies/ records that were reviewed today include: Dr Johnsie Cancel and Dr Jackalyn Lombard office notes  Assessment and Plan:  1.  Complete heart block Normal PPM function See Pace Art report No changes today  2.  Permanent atrial fibrillation CHADS2VASC is at least 4 He remains clear in his decision to decline anticoagulation, discussed with patient again today  3.  HTN Stable No change required today  4.  CAD Stable by symptoms   Current medicines are reviewed at length with the patient today.   The patient does not have concerns regarding his medicines.  The  following changes were made today:  none  Labs/ tests ordered today include: none  Orders Placed This Encounter  Procedures  . CUP PACEART INCLINIC DEVICE CHECK     Disposition:   Follow up with [facility] transmissions, Dr Johnsie Cancel as scheduled, me in 1 year      Signed, Chanetta Marshall, NP 10/27/2016 11:55 AM  [facility] [address] [place] [address] [phone] (office) [phone] (fax)

## 2016-10-27 ENCOUNTER — Ambulatory Visit (INDEPENDENT_AMBULATORY_CARE_PROVIDER_SITE_OTHER): Payer: Medicare Other | Admitting: Nurse Practitioner

## 2016-10-27 ENCOUNTER — Encounter: Payer: Self-pay | Admitting: Nurse Practitioner

## 2016-10-27 ENCOUNTER — Other Ambulatory Visit: Payer: Self-pay | Admitting: *Deleted

## 2016-10-27 VITALS — BP 134/62 | HR 79 | Ht 68.0 in | Wt 196.0 lb

## 2016-10-27 DIAGNOSIS — I4821 Permanent atrial fibrillation: Secondary | ICD-10-CM

## 2016-10-27 DIAGNOSIS — I482 Chronic atrial fibrillation: Secondary | ICD-10-CM | POA: Diagnosis not present

## 2016-10-27 DIAGNOSIS — I442 Atrioventricular block, complete: Secondary | ICD-10-CM

## 2016-10-27 DIAGNOSIS — I10 Essential (primary) hypertension: Secondary | ICD-10-CM

## 2016-10-27 DIAGNOSIS — I2510 Atherosclerotic heart disease of native coronary artery without angina pectoris: Secondary | ICD-10-CM

## 2016-10-27 LAB — CUP PACEART INCLINIC DEVICE CHECK
Implantable Lead Location: 753860
Implantable Lead Model: 5076
Implantable Pulse Generator Implant Date: 20160318
MDC IDC LEAD IMPLANT DT: 20160318
MDC IDC SESS DTM: 20180628113931

## 2016-10-27 NOTE — Patient Instructions (Signed)
Medication Instructions:  Your physician recommends that you continue on your current medications as directed. Please refer to the Current Medication list given to you today.   Labwork: None Ordered   Testing/Procedures: None Ordered   Follow-Up: Your physician wants you to follow-up in: 1 year with Dakota Marshall, NP You will receive a reminder letter in the mail two months in advance. If you don't receive a letter, please call our office to schedule the follow-up appointment.   Remote monitoring is used to monitor your Pacemaker of ICD from home. This monitoring reduces the number of office visits required to check your device to one time per year. It allows [place] to keep an eye on the functioning of your device to ensure it is working properly. You are scheduled for a device check from home on 01/26/17. You may send your transmission at any time that day. If you have a wireless device, the transmission will be sent automatically. After your physician reviews your transmission, you will receive a postcard with your next transmission date.    Any Other Special Instructions Will Be Listed Below (If Applicable).     If you need a refill on your cardiac medications before your next appointment, please call your pharmacy.

## 2016-11-28 ENCOUNTER — Telehealth: Payer: Self-pay | Admitting: Family Medicine

## 2016-11-28 NOTE — Telephone Encounter (Signed)
Placed in Dr. Josefine Class In [place] for signature.

## 2016-11-28 NOTE — Telephone Encounter (Signed)
Ann dropped off parking placard form to be filled out. Placed in [place] tower.

## 2016-11-29 NOTE — Telephone Encounter (Signed)
Form done. Thanks. 

## 2016-11-29 NOTE — Telephone Encounter (Signed)
Patient advised.  Form left at front desk for pick up. 

## 2017-01-04 NOTE — Progress Notes (Signed)
Patient ID: Dakota [name], male   DOB: January 09, 1929, 83 y.o.   MRN: [id number]     Cardiology Office Note   Date:  01/09/2017   ID:  Dakota [name], DOB [birth year], MRN [id number]  PCP:  Tonia Ghent, MD  Cardiologist:  Dr [name], MD   No chief complaint on file.   History of Present Illness: Dakota [name] is seen today for f/U of afib and HTN. Marland KitchenHe has chronic afib and has refused to be on coumadin. Forutuanatly he has not had a CVA, TIA or embolic event. His BP is under good control. I discussed Pradaxa with him and he still wishes to be just on asa since he feels so well I encouraged him to F/U with Dr Rodena Piety as his vision is still a problem Wife has bad back problems with parkinsons and is essentially total care. Encouraged him to look into assisted living for her He tried this for 2 months but didn't think they looked after her well   07/26/14 D/C after SEMI with PDA occlusion.  Symptomatic CHB with pacer implant  07/18/14  Echo reviewed EF 55% no significant valve disease   Seen by PA 08/13/14   for evaluation of paresthesias of LE, weakness, and failure to increase his activity. Plavix stopped that visit Much better off this med   Discussed issues with previous statin Long time ago indicates losing weight and weakness details not clear   Doing well.  Need laxative and stool softener.  Some LE edema Hearing a bit worse   01/06/16 EGD to remove food impaction with esophagitis and Schatzki ring    Some indigestion for which he takes TUMS     Past Medical History:  Diagnosis Date  . Anxiety   . Back pain, chronic   . Complete heart block ([place]) 07/18/2014   Medtronic [place] model [id number] (serial number [id number])  singe lead PPM  . GERD (gastroesophageal reflux disease) 10/2002  . Hyperlipidemia 12/1994  . Hypertension   . Hypokalemia   . Lung collapse 03/1991   Fall  . Permanent atrial fibrillation ([name])    Refused coumadin therapy   Past Surgical History:    Procedure Laterality Date  . BACK SURGERY  11/91   with hardware fixation  . CARDIAC CATHETERIZATION  06/2004   30 % stenosis, EF normal  . ESOPHAGOGASTRODUODENOSCOPY N/A 01/06/2016   Procedure: ESOPHAGOGASTRODUODENOSCOPY (EGD);  Surgeon: Otis Brace, MD;  Location: [facility];  Service: Gastroenterology;  Laterality: N/A;  . EYE SURGERY     Retinal bubble surgery, cataract   . LEFT HEART CATHETERIZATION WITH CORONARY ANGIOGRAM N/A 07/18/2014   Procedure: LEFT HEART CATHETERIZATION WITH CORONARY ANGIOGRAM;  Surgeon: Jettie Booze, MD; [facility] [place], LAD mild dz, D1 80%, D2 OK, CFX system OK, RCA OK, PDA 100%, med rx  . PERMANENT PACEMAKER INSERTION N/A 07/18/2014   Procedure: PERMANENT PACEMAKER INSERTION;  Surgeon: Thompson Grayer, MD; Medtronic [place] model [phone] (serial number [id number])    . TEMPORARY PACEMAKER INSERTION N/A 07/18/2014   Procedure: TEMPORARY PACEMAKER INSERTION;  Surgeon: Vikas Wegmann M Martinique, MD;  Location: [facility] CATH LAB;  Service: Cardiovascular;  Laterality: N/A;    Current Outpatient Prescriptions  Medication Sig Dispense Refill  . ALPRAZolam (XANAX) 0.5 MG tablet TAKE ONE-HALF TABLET BY MOUTH TWICE DAILY AS NEEDED FOR ANXIETY 60 tablet 1  . aspirin 81 MG tablet Take 81 mg by mouth 2 (two) times daily.     . calcium carbonate (TUMS - DOSED IN  MG ELEMENTAL CALCIUM) 500 MG chewable tablet Chew 1 tablet by mouth daily as needed for indigestion.     . carvedilol (COREG) 25 MG tablet TAKE ONE TABLET BY MOUTH TWICE DAILY WITH A MEAL 180 tablet 1  . hydrochlorothiazide (MICROZIDE) 12.5 MG capsule Take 12.5 mg by mouth daily as needed (swelling).    . magnesium hydroxide (MILK OF MAGNESIA) 400 MG/5ML suspension Take by mouth daily as needed for mild constipation.    . nitroGLYCERIN (NITROSTAT) 0.4 MG SL tablet Place 1 tablet (0.4 mg total) under the tongue every 5 (five) minutes as needed for chest pain. 25 tablet 3  . pantoprazole (PROTONIX) 40 MG tablet Take 1 tablet (40 mg  total) by mouth daily. 30 tablet 0  . ranitidine (ZANTAC) 150 MG tablet TAKE ONE TABLET BY MOUTH ONCE DAILY 90 tablet 1   No current facility-administered medications for this visit.     Allergies:   Warfarin and related; Atorvastatin; Ezetimibe; Diazepam; and Penicillins    Social History:  The patient  reports that he quit smoking about 63 years ago. He has a 15.00 pack-year smoking history. His smokeless tobacco use includes Chew. He reports that he does not drink alcohol or use drugs.   Family History:  The patient's family history includes Cancer in his brother, brother, and father; Heart disease in his sister and son; Hip fracture in his mother; Pneumonia in his mother; Prostate cancer in his brother.    ROS:  Please see the history of present illness. All other systems are reviewed and negative.    PHYSICAL EXAM: VS:  BP 114/62   Pulse 74   Ht 5\' 8"  (1.727 m)   Wt 193 lb 6.4 oz (87.7 kg)   SpO2 95%   BMI 29.41 kg/m  , BMI Body mass index is 29.41 kg/m. Affect appropriate Healthy:  appears stated age 36: normal Neck supple with no adenopathy JVP normal no bruits no thyromegaly Lungs clear with no wheezing and good diaphragmatic motion Heart:  S1/S2 no murmur, no rub, gallop or click PMI normal Abdomen: benighn, BS positve, no tenderness, no AAA no bruit.  No HSM or HJR Distal pulses intact with no bruits Plus one bilateral  edema Neuro non-focal Skin warm and dry No muscular weakness    EKG:   08/13/14   atrial fib, V pacing with 2 intrinsic beats. Of note, his heart rate increased to 115 with ambulation; oxygen saturation remained greater than 95%   Recent Labs: No results found for requested labs within last 8760 hours.   Lipid Panel    Component Value Date/Time   CHOL 199 10/05/2015 1321   TRIG 129.0 10/05/2015 1321   HDL 35.30 (L) 10/05/2015 1321   CHOLHDL 6 10/05/2015 1321   VLDL 25.8 10/05/2015 1321   LDLCALC 138 (H) 10/05/2015 1321    LDLDIRECT 155.7 10/19/2012 0851     Wt Readings from Last 3 Encounters:  01/09/17 193 lb 6.4 oz (87.7 kg)  10/27/16 196 lb (88.9 kg)  07/18/16 191 lb 12.8 oz (87 kg)     Other studies Reviewed: Additional studies/ records that were reviewed today include: recent hospital records.  ASSESSMENT AND PLAN:  1.  Lower extremity Edema:  PRN HCTZ 12.5 mg  refilled  2. 07/26/14  non-STEMI, medical therapy for occluded PDA: The patient is not having any ischemic symptoms. EF was 55% by echocardiogram. Continue beta blocker and aspirin. Plavix stopped due to side effects   3. Hypertension: Dakota [name]  has been on carvedilol 25 mg twice a day for a long time. His blood pressure is well controlled.  4. Constipation:  Discussed hydration fiber and colace 100 bid called in   5. Hypokalemia: resovled   6. DM:  Discussed low carb diet.  Target hemoglobin A1c is 6.5 or less.  Continue current medications.  Lab Results  Component Value Date   HGBA1C 6.0 (H) 02/01/2016     7. Pacer :  Normal function single lead MDT implant 2016 CHB Continue remote transmissions f/u [facility] in spring  Set VVIR due to afib   8. Chol:  Check labs 3 months and consider starting statin   9. GI:  Schatzki ring with food impaction EGD 01/06/16 with esophagitis Continue protonix has f/u with [facility]    10:  Afib:  Rate control is fine patient declines anticoagulation   Current medicines are reviewed at length with the patient today.  The patient has concerns regarding medicines.  The following changes have been made: None  Labs/ tests ordered today include:  None   No orders of the defined types were placed in this encounter.    Disposition:   FU with me in 6 months   Signed, Jenkins Rouge, MD  01/09/2017 2:13 PM    [facility] [address], [place], [place]  [address] Phone: [phone]; Fax: [phone]

## 2017-01-09 ENCOUNTER — Telehealth: Payer: Self-pay | Admitting: Cardiovascular Disease

## 2017-01-09 ENCOUNTER — Other Ambulatory Visit: Payer: Self-pay | Admitting: *Deleted

## 2017-01-09 ENCOUNTER — Ambulatory Visit (INDEPENDENT_AMBULATORY_CARE_PROVIDER_SITE_OTHER): Payer: Medicare Other | Admitting: Cardiovascular Disease

## 2017-01-09 ENCOUNTER — Encounter: Payer: Self-pay | Admitting: Cardiovascular Disease

## 2017-01-09 VITALS — BP 114/62 | HR 74 | Ht 68.0 in | Wt 193.4 lb

## 2017-01-09 DIAGNOSIS — I2510 Atherosclerotic heart disease of native coronary artery without angina pectoris: Secondary | ICD-10-CM | POA: Diagnosis not present

## 2017-01-09 MED ORDER — ALPRAZOLAM 0.5 MG PO TABS: ORAL_TABLET | ORAL | 1 refills | Status: DC

## 2017-01-09 NOTE — Patient Instructions (Signed)

## 2017-01-09 NOTE — Telephone Encounter (Signed)
Please call in.  Thanks.  This is the first request I had seen for this.

## 2017-01-09 NOTE — Telephone Encounter (Signed)
Patient's daughter left a voicemail stating that Dakota [name] has been trying to get a refill on his Alprazolam since Friday. Last refill 08/16/16 #60/1 Last office visit 05/23/16

## 2017-01-09 NOTE — Telephone Encounter (Signed)
[id number] signed) left v/m requesting refill alprazolam today. Pt is out of med.Please advise.

## 2017-01-09 NOTE — Telephone Encounter (Signed)
Rx called to pharmacy as instructed. Patient's son notified as instructed by telephone and verbalized understanding.

## 2017-01-09 NOTE — Telephone Encounter (Signed)
Patient's son was requesting a refill for patient's xanax. Informed him that our office only refills cardiac medications and he should call the primary care doctor for this refill. Patient's son verbalized understanding.

## 2017-01-09 NOTE — Telephone Encounter (Signed)
[facility] People( Son is on [place])  is calling to speak with you about Mr. Gable prescription . Please Call

## 2017-01-23 ENCOUNTER — Encounter: Payer: Self-pay | Admitting: Family Medicine

## 2017-01-23 ENCOUNTER — Ambulatory Visit (INDEPENDENT_AMBULATORY_CARE_PROVIDER_SITE_OTHER): Payer: Medicare Other

## 2017-01-23 ENCOUNTER — Ambulatory Visit (INDEPENDENT_AMBULATORY_CARE_PROVIDER_SITE_OTHER): Payer: Medicare Other | Admitting: Family Medicine

## 2017-01-23 VITALS — BP 110/74 | HR 62 | Temp 97.6°F | Ht 67.75 in | Wt 195.5 lb

## 2017-01-23 DIAGNOSIS — F411 Generalized anxiety disorder: Secondary | ICD-10-CM | POA: Diagnosis not present

## 2017-01-23 DIAGNOSIS — Z0000 Encounter for general adult medical examination without abnormal findings: Secondary | ICD-10-CM

## 2017-01-23 DIAGNOSIS — R739 Hyperglycemia, unspecified: Secondary | ICD-10-CM | POA: Diagnosis not present

## 2017-01-23 DIAGNOSIS — I2510 Atherosclerotic heart disease of native coronary artery without angina pectoris: Secondary | ICD-10-CM

## 2017-01-23 DIAGNOSIS — I10 Essential (primary) hypertension: Secondary | ICD-10-CM | POA: Diagnosis not present

## 2017-01-23 DIAGNOSIS — Z636 Dependent relative needing care at home: Secondary | ICD-10-CM

## 2017-01-23 DIAGNOSIS — Z7189 Other specified counseling: Secondary | ICD-10-CM

## 2017-01-23 DIAGNOSIS — I4891 Unspecified atrial fibrillation: Secondary | ICD-10-CM | POA: Diagnosis not present

## 2017-01-23 LAB — CBC WITH DIFFERENTIAL/PLATELET
BASOS PCT: 0.7 % (ref 0.0–3.0)
Basophils Absolute: 0.1 10*3/uL (ref 0.0–0.1)
EOS PCT: 5.1 % — AB (ref 0.0–5.0)
Eosinophils Absolute: 0.5 10*3/uL (ref 0.0–0.7)
HEMATOCRIT: 46.2 % (ref 39.0–52.0)
HEMOGLOBIN: 15.4 g/dL (ref 13.0–17.0)
LYMPHS PCT: 31.3 % (ref 12.0–46.0)
Lymphs Abs: 2.8 10*3/uL (ref 0.7–4.0)
MCHC: 33.2 g/dL (ref 30.0–36.0)
MCV: 91.5 fl (ref 78.0–100.0)
MONO ABS: 0.8 10*3/uL (ref 0.1–1.0)
Monocytes Relative: 8.6 % (ref 3.0–12.0)
NEUTROS ABS: 4.9 10*3/uL (ref 1.4–7.7)
Neutrophils Relative %: 54.3 % (ref 43.0–77.0)
PLATELETS: 245 10*3/uL (ref 150.0–400.0)
RBC: 5.06 Mil/uL (ref 4.22–5.81)
RDW: 14 % (ref 11.5–15.5)
WBC: 9 10*3/uL (ref 4.0–10.5)

## 2017-01-23 LAB — COMPREHENSIVE METABOLIC PANEL
ALBUMIN: 3.8 g/dL (ref 3.5–5.2)
ALT: 19 U/L (ref 0–53)
AST: 20 U/L (ref 0–37)
Alkaline Phosphatase: 53 U/L (ref 39–117)
BILIRUBIN TOTAL: 0.6 mg/dL (ref 0.2–1.2)
BUN: 26 mg/dL — AB (ref 6–23)
CALCIUM: 9.9 mg/dL (ref 8.4–10.5)
CO2: 32 mEq/L (ref 19–32)
CREATININE: 1.35 mg/dL (ref 0.40–1.50)
Chloride: 102 mEq/L (ref 96–112)
GFR: 53.25 mL/min — ABNORMAL LOW (ref >60.00)
Glucose, Bld: 115 mg/dL — ABNORMAL HIGH (ref 70–99)
Potassium: 5.2 mEq/L — ABNORMAL HIGH (ref 3.5–5.1)
SODIUM: 139 meq/L (ref 135–145)
Total Protein: 7.4 g/dL (ref 6.0–8.3)

## 2017-01-23 LAB — TSH: TSH: 2.56 u[IU]/mL (ref 0.35–4.50)

## 2017-01-23 LAB — HEMOGLOBIN A1C: Hgb A1c MFr Bld: 6.2 % (ref 4.6–6.5)

## 2017-01-23 MED ORDER — DOCUSATE SODIUM 100 MG PO CAPS: 100.0000 mg | ORAL_CAPSULE | Freq: Every day | ORAL | Status: DC | PRN

## 2017-01-23 NOTE — Progress Notes (Signed)
Subjective:   Dakota [name] is a 83 y.o. male who presents for Medicare Annual/Subsequent preventive examination.  Review of Systems:  N/A Cardiac Risk Factors include: advanced age (>52men, >45 women);dyslipidemia;hypertension     Objective:    Vitals: BP 110/74 (BP Location: Right Arm, Patient Position: Sitting, Cuff Size: Normal)   Pulse 62   Temp 97.6 F (36.4 C) (Oral)   Ht 5' 7.75" (1.721 m) Comment: no shoes  Wt 195 lb 8 oz (88.7 kg)   SpO2 97%   BMI 29.95 kg/m   Body mass index is 29.95 kg/m.  Tobacco History  Smoking Status  . Former Smoker  . Packs/day: 1.00  . Years: 15.00  . Quit date: 05/02/1953  Smokeless Tobacco  . Current User  . Types: Chew    Comment: Quit smoking over 50 years ago     Ready to quit: No Counseling given: No   Past Medical History:  Diagnosis Date  . Anxiety   . Back pain, chronic   . Complete heart block ([place]) 07/18/2014   Medtronic [place] model [id number] (serial number [id number])  singe lead PPM  . GERD (gastroesophageal reflux disease) 10/2002  . Hyperlipidemia 12/1994  . Hypertension   . Hypokalemia   . Lung collapse 03/1991   Fall  . Permanent atrial fibrillation ([name])    Refused coumadin therapy   Past Surgical History:  Procedure Laterality Date  . BACK SURGERY  11/91   with hardware fixation  . CARDIAC CATHETERIZATION  06/2004   30 % stenosis, EF normal  . ESOPHAGOGASTRODUODENOSCOPY N/A 01/06/2016   Procedure: ESOPHAGOGASTRODUODENOSCOPY (EGD);  Surgeon: Otis Brace, MD;  Location: [facility];  Service: Gastroenterology;  Laterality: N/A;  . EYE SURGERY     Retinal bubble surgery, cataract   . LEFT HEART CATHETERIZATION WITH CORONARY ANGIOGRAM N/A 07/18/2014   Procedure: LEFT HEART CATHETERIZATION WITH CORONARY ANGIOGRAM;  Surgeon: Jettie Booze, MD; [facility] [place], LAD mild dz, D1 80%, D2 OK, CFX system OK, RCA OK, PDA 100%, med rx  . PERMANENT PACEMAKER INSERTION N/A 07/18/2014   Procedure: PERMANENT  PACEMAKER INSERTION;  Surgeon: Thompson Grayer, MD; Medtronic [place] model [phone] (serial number [id number])    . TEMPORARY PACEMAKER INSERTION N/A 07/18/2014   Procedure: TEMPORARY PACEMAKER INSERTION;  Surgeon: Peter M Martinique, MD;  Location: [facility] CATH LAB;  Service: Cardiovascular;  Laterality: N/A;   Family History  Problem Relation Age of Onset  . Pneumonia Mother   . Hip fracture Mother   . Cancer Father        splenic  . Heart disease Sister        Pacer placed  . Cancer Brother        throat and lung  . Cancer Brother        prostate, age 61  . Prostate cancer Brother   . Heart disease Son   . Stroke Neg Hx   . Colon cancer Neg Hx    History  Sexual Activity  . Sexual activity: No    Outpatient Encounter Prescriptions as of 01/23/2017  Medication Sig  . ALPRAZolam (XANAX) 0.5 MG tablet TAKE ONE-HALF TABLET BY MOUTH TWICE DAILY AS NEEDED FOR ANXIETY  . aspirin 81 MG tablet Take 81 mg by mouth 2 (two) times daily.   . calcium carbonate (TUMS - DOSED IN MG ELEMENTAL CALCIUM) 500 MG chewable tablet Chew 1 tablet by mouth daily as needed for indigestion.   . carvedilol (COREG) 25 MG tablet TAKE ONE TABLET BY  MOUTH TWICE DAILY WITH A MEAL  . hydrochlorothiazide (MICROZIDE) 12.5 MG capsule Take 12.5 mg by mouth daily as needed (swelling).  . magnesium hydroxide (MILK OF MAGNESIA) 400 MG/5ML suspension Take by mouth daily as needed for mild constipation.  . nitroGLYCERIN (NITROSTAT) 0.4 MG SL tablet Place 1 tablet (0.4 mg total) under the tongue every 5 (five) minutes as needed for chest pain.  . pantoprazole (PROTONIX) 40 MG tablet Take 1 tablet (40 mg total) by mouth daily.  . ranitidine (ZANTAC) 150 MG tablet TAKE ONE TABLET BY MOUTH ONCE DAILY   No facility-administered encounter medications on file as of 01/23/2017.     Activities of Daily Living In your present state of health, do you have any difficulty performing the following activities: 01/23/2017  Hearing? Y  Vision? N    Difficulty concentrating or making decisions? Y  Walking or climbing stairs? Y  Dressing or bathing? N  Doing errands, shopping? Y  Preparing Food and eating ? N  Using the Toilet? N  In the past six months, have you accidently leaked urine? Y  Do you have problems with loss of bowel control? N  Managing your Medications? N  Managing your Finances? N  Housekeeping or managing your Housekeeping? N  Some recent data might be hidden    Patient Care Team: Tonia Ghent, MD as PCP - General (Family Medicine) Josue Hector, MD as Consulting Physician (Cardiology) Thompson Grayer, MD as Consulting Physician (Cardiology) Patsey Berthold, NP as Nurse Practitioner (Cardiology) Ardis Hughs, MD as Attending Physician (Urology)   Assessment:     Hearing Screening   125Hz  250Hz  500Hz  1000Hz  2000Hz  3000Hz  4000Hz  6000Hz  8000Hz   Right ear:   40 0 0  0    Left ear:   0 0 0  0      Visual Acuity Screening   Right eye Left eye Both eyes  Without correction: 20/70-1 20/200 20/70  With correction:       Exercise Activities and Dietary recommendations Current Exercise Habits: The patient does not participate in regular exercise at present, Exercise limited by: None identified  Goals    . Increase water intake          Starting 01/23/2017, I will continue to drink at least 6-8 glasses of water daily.      Fall Risk Fall Risk  01/23/2017 10/05/2015 09/23/2015 10/26/2012  Falls in the past year? No Yes Yes No  Number falls in past yr: - 1 1 -  Injury with Fall? - - No -  Risk for fall due to : - - Impaired balance/gait;History of fall(s) -  Follow up - - Falls evaluation completed;Education provided -   Depression Screen PHQ 2/9 Scores 01/23/2017 10/05/2015 09/23/2015 10/26/2012  PHQ - 2 Score 0 2 0 0    Cognitive Function MMSE - Mini Mental State Exam 01/23/2017 09/23/2015  Orientation to time 5 5  Orientation to Place 5 5  Registration 3 3  Attention/ Calculation 0 0  Recall 2 0   Recall-comments unable to recall 1 of 3 words unable to recall words  Language- name 2 objects 0 0  Language- repeat 1 1  Language- follow 3 step command 2 3  Language- follow 3 step command-comments unable to follow 1 step of 3 step command multiple ques were given  Language- read & follow direction 0 0  Write a sentence 0 0  Copy design 0 0  Total score 18 17  PLEASE NOTE: A Mini-Cog screen was completed. Maximum score is 20. A value of 0 denotes this part of [place]stein MMSE was not completed or the patient failed this part of the Mini-Cog screening.   Mini-Cog Screening Orientation to Time - Max 5 pts Orientation to Place - Max 5 pts Registration - Max 3 pts Recall - Max 3 pts Language Repeat - Max 1 pts Language Follow 3 Step Command - Max 3 pts     Immunization History  Administered Date(s) Administered  . Td 05/02/1997, 11/26/2007   Screening Tests Health Maintenance  Topic Date Due  . INFLUENZA VACCINE  07/30/2017 (Originally 11/30/2016)  . PNA vac Low Risk Adult (1 of 2 - PCV13) 09/23/2023 (Originally 01/06/1996)  . TETANUS/TDAP  11/25/2017      Plan:     I have personally reviewed and addressed the Medicare Annual Wellness questionnaire and have noted the following in the patient's chart:  A. Medical and social history B. Use of alcohol, tobacco or illicit drugs  C. Current medications and supplements D. Functional ability and status E.  Nutritional status F.  Physical activity G. Advance directives H. List of other physicians I.  Hospitalizations, surgeries, and ER visits in previous 12 months J.  [place] to include hearing, vision, cognitive, depression L. Referrals and appointments - none  In addition, I have reviewed and discussed with patient certain preventive protocols, quality metrics, and best practice recommendations. A written personalized care plan for preventive services as well as general preventive health recommendations were  provided to patient.  See attached scanned questionnaire for additional information.   Signed,   Lindell Noe, MHA, BS, LPN Health Coach

## 2017-01-23 NOTE — Patient Instructions (Signed)
Go to the lab on the way out.  We'll contact you with your lab report. Take care.  Glad to see you.  Update me as needed.  Use colace as needed.

## 2017-01-23 NOTE — Patient Instructions (Signed)
Dakota [name] , Thank you for taking time to come for your Medicare Wellness Visit. I appreciate your ongoing commitment to your health goals. Please review the following plan we discussed and let me know if I can assist you in the future.   These are the goals we discussed: Goals    . Increase water intake          Starting 01/23/2017, I will continue to drink at least 6-8 glasses of water daily.       This is a list of the screening recommended for you and due dates:  Health Maintenance  Topic Date Due  . Flu Shot  07/30/2017*  . Pneumonia vaccines (1 of 2 - PCV13) 09/23/2023*  . Tetanus Vaccine  11/25/2017  *Topic was postponed. The date shown is not the original due date.   Preventive Care for Adults  A healthy lifestyle and preventive care can promote health and wellness. Preventive health guidelines for adults include the following key practices.  . A routine yearly physical is a good way to check with your health care provider about your health and preventive screening. It is a chance to share any concerns and updates on your health and to receive a thorough exam.  . Visit your dentist for a routine exam and preventive care every 6 months. Brush your teeth twice a day and floss once a day. Good oral hygiene prevents tooth decay and gum disease.  . The frequency of eye exams is based on your age, health, family medical history, use  of contact lenses, and other factors. Follow your health care provider's ecommendations for frequency of eye exams.  . Eat a healthy diet. Foods like vegetables, fruits, whole grains, low-fat dairy products, and lean protein foods contain the nutrients you need without too many calories. Decrease your intake of foods high in solid fats, added sugars, and salt. Eat the right amount of calories for you. Get information about a proper diet from your health care provider, if necessary.  . Regular physical exercise is one of the most important things you  can do for your health. Most adults should get at least 150 minutes of moderate-intensity exercise (any activity that increases your heart rate and causes you to sweat) each week. In addition, most adults need muscle-strengthening exercises on 2 or more days a week.  Silver Sneakers may be a benefit available to you. To determine eligibility, you may visit the website: www.silversneakers.com or contact program at (414) 110-2190 Mon-Fri between 8AM-8PM.   . Maintain a healthy weight. The body mass index (BMI) is a screening tool to identify possible weight problems. It provides an estimate of body fat based on height and weight. Your health care provider can find your BMI and can help you achieve or maintain a healthy weight.   For adults 20 years and older: ? A BMI below 18.5 is considered underweight. ? A BMI of 18.5 to 24.9 is normal. ? A BMI of 25 to 29.9 is considered overweight. ? A BMI of 30 and above is considered obese.   . Maintain normal blood lipids and cholesterol levels by exercising and minimizing your intake of saturated fat. Eat a balanced diet with plenty of fruit and vegetables. Blood tests for lipids and cholesterol should begin at age 2 and be repeated every 5 years. If your lipid or cholesterol levels are high, you are over 50, or you are at high risk for heart disease, you may need your cholesterol levels checked  more frequently. Ongoing high lipid and cholesterol levels should be treated with medicines if diet and exercise are not working.  . If you smoke, find out from your health care provider how to quit. If you do not use tobacco, please do not start.  . If you choose to drink alcohol, please do not consume more than 2 drinks per day. One drink is considered to be 12 ounces (355 mL) of beer, 5 ounces (148 mL) of wine, or 1.5 ounces (44 mL) of liquor.  . If you are 52-37 years old, ask your health care provider if you should take aspirin to prevent strokes.  . Use  sunscreen. Apply sunscreen liberally and repeatedly throughout the day. You should seek shade when your shadow is shorter than you. Protect yourself by wearing long sleeves, pants, a wide-brimmed hat, and sunglasses year round, whenever you are outdoors.  . Once a month, do a whole body skin exam, using a mirror to look at the skin on your back. Tell your health care provider of new moles, moles that have irregular borders, moles that are larger than a pencil eraser, or moles that have changed in shape or color.

## 2017-01-23 NOTE — Progress Notes (Signed)
Pre visit review using our clinic review tool, if applicable. No additional management support is needed unless otherwise documented below in the visit note. 

## 2017-01-23 NOTE — Progress Notes (Signed)
Declined hearing aids.   D/w pt about his hearing.    He is still caring for his wife.  He had meals on wheels come out.  His son is helping some. He was considering getting hospice involved, d/w pt.  Sig source of anxiety for patient.  His gait was affected, on higher dose of BZD, he cut the dose back in the meantime and felt better with that.  Fall cautions d/w pt.   D/w pt about possible anticoagulation.  I'll defer to patient and cardiology.  He was worried about return of hematuria.  He has discussed with cardiology.   Hypertension:    Using medication without problems or lightheadedness: yes Chest pain with exertion: not exertional sx.  he thought he had some "electric shock" feelings that the patient attributed to the pacemaker but it will always get better before he could ever use NTG.  Per patient, not acute, has discussed with cardiology prev.   Edema: trace, at baseline.   Short of breath:no  D/w pt about stool softener use, ie prn.   Advance directive d/w pt.  Son An Schnabel designated if patient were incapacitated.   H/o hyperglycemia, due for labs.  See notes on labs.    PMH and SH reviewed  ROS: Per HPI unless specifically indicated in ROS section   Meds, vitals, and allergies reviewed.   Elderly male, NAD Hard of hearing OP with MM Neck supple, no LA Not tachy but occ ectopy noted.  ctab abd soft Ext with trace edema .

## 2017-01-23 NOTE — Progress Notes (Signed)
PCP notes:   Health maintenance:  Flu vaccine - patient declined  Abnormal screenings:   Mini-Cog score: 18/20  Hearing - failed  Hearing Screening   125Hz  250Hz  500Hz  1000Hz  2000Hz  3000Hz  4000Hz  6000Hz  8000Hz   Right ear:   40 0 0  0    Left ear:   0 0 0  0     Patient concerns:   None  Nurse concerns:  None  Next PCP appt:   01/23/17 @ 1415  I reviewed health advisor's note, was available for consultation on the day of service listed in this note, and agree with documentation and plan. [name], MD.

## 2017-01-26 ENCOUNTER — Ambulatory Visit (INDEPENDENT_AMBULATORY_CARE_PROVIDER_SITE_OTHER): Payer: Medicare Other | Admitting: *Deleted

## 2017-01-26 DIAGNOSIS — I442 Atrioventricular block, complete: Secondary | ICD-10-CM

## 2017-01-26 LAB — CUP PACEART REMOTE DEVICE CHECK
Brady Statistic RV Percent Paced: 69 %
Implantable Lead Implant Date: 20160318
Implantable Lead Model: 5076
Lead Channel Impedance Value: 490 Ohm
Lead Channel Pacing Threshold Amplitude: 0.875 V
Lead Channel Pacing Threshold Pulse Width: 0.4 ms
Lead Channel Setting Pacing Pulse Width: 0.4 ms
MDC IDC LEAD LOCATION: 753860
MDC IDC MSMT BATTERY IMPEDANCE: 495 Ohm
MDC IDC MSMT BATTERY REMAINING LONGEVITY: 83 mo
MDC IDC MSMT BATTERY VOLTAGE: 2.76 V
MDC IDC MSMT LEADCHNL RA IMPEDANCE VALUE: 0 Ohm
MDC IDC PG IMPLANT DT: 20160318
MDC IDC SESS DTM: 20180926134525
MDC IDC SET LEADCHNL RV PACING AMPLITUDE: 2.5 V
MDC IDC SET LEADCHNL RV SENSING SENSITIVITY: 2 mV

## 2017-01-26 NOTE — Progress Notes (Signed)
Remote pacemaker transmission.   

## 2017-01-27 NOTE — Assessment & Plan Note (Signed)
H/o hyperglycemia, due for labs.  See notes on labs.

## 2017-01-27 NOTE — Assessment & Plan Note (Signed)
Reasonable control of BP, no change in meds.  Continue as is.  He agrees.  See notes on labs.

## 2017-01-27 NOTE — Assessment & Plan Note (Addendum)
He is still caring for his wife.  He had meals on wheels come out.  His son is helping some. He was considering getting hospice involved, d/w pt.  Sig source of anxiety for patient.  His gait was affected, on higher dose of BZD, he cut the dose back in the meantime and felt better with that.  Fall cautions d/w pt.  Support offered.  I asked him to update me if I could be of service.

## 2017-01-27 NOTE — Assessment & Plan Note (Addendum)
He is still caring for his wife.  He had meals on wheels come out.  His son is helping some. He was considering getting hospice involved, d/w pt.  Sig source of anxiety for patient.  His gait was affected, on higher dose of BZD, he cut the dose back in the meantime and felt better with that.  Fall cautions d/w pt.  >25 minutes spent in face to face time with patient, >50% spent in counselling or coordination of care in total re: caregiver stress, HTN, AF/anticoagulation considerations.

## 2017-01-27 NOTE — Assessment & Plan Note (Signed)
D/w pt about possible anticoagulation.  I'll defer to patient and cardiology.  He was worried about return of hematuria.  He has discussed with cardiology.

## 2017-01-27 NOTE — Assessment & Plan Note (Signed)
Advance directive d/w pt.  Son Jani Ploeger designated if patient were incapacitated.

## 2017-01-28 ENCOUNTER — Other Ambulatory Visit: Payer: Self-pay | Admitting: Cardiovascular Disease

## 2017-01-30 ENCOUNTER — Other Ambulatory Visit: Payer: Self-pay | Admitting: Cardiovascular Disease

## 2017-01-31 ENCOUNTER — Encounter: Payer: Self-pay | Admitting: Cardiology

## 2017-02-17 ENCOUNTER — Encounter: Payer: Self-pay | Admitting: Cardiology

## 2017-04-06 ENCOUNTER — Other Ambulatory Visit: Payer: Self-pay

## 2017-04-18 DIAGNOSIS — L723 Sebaceous cyst: Secondary | ICD-10-CM | POA: Diagnosis not present

## 2017-04-18 DIAGNOSIS — L089 Local infection of the skin and subcutaneous tissue, unspecified: Secondary | ICD-10-CM | POA: Diagnosis not present

## 2017-04-28 ENCOUNTER — Encounter: Payer: Medicare Other | Admitting: *Deleted

## 2017-04-28 ENCOUNTER — Telehealth: Payer: Self-pay | Admitting: Cardiovascular Disease

## 2017-04-28 NOTE — Telephone Encounter (Signed)
New Message     1. Has your device fired? no  2. Is you device beeping? yes  3. Are you experiencing draining or swelling at device site? no  4. Are you calling to see if we received your device transmission? no  5. Have you passed out? No  Patient states that the device is reading an error message 3230. Please call.     Please route to [facility]

## 2017-04-28 NOTE — Telephone Encounter (Signed)
Spoke w/ pt daughter w/ and attempted to help her send remote transmission. After one unsuccessful attempt instructed pt daughter to call tech support. Pt daughter verbalized understanding.

## 2017-05-08 ENCOUNTER — Ambulatory Visit (INDEPENDENT_AMBULATORY_CARE_PROVIDER_SITE_OTHER): Payer: Medicare Other | Admitting: *Deleted

## 2017-05-08 DIAGNOSIS — I442 Atrioventricular block, complete: Secondary | ICD-10-CM | POA: Diagnosis not present

## 2017-05-08 NOTE — Progress Notes (Signed)
Remote pacemaker transmission.   

## 2017-05-09 ENCOUNTER — Encounter: Payer: Self-pay | Admitting: Cardiology

## 2017-05-09 LAB — CUP PACEART REMOTE DEVICE CHECK
Battery Impedance: 545 Ohm
Battery Voltage: 2.76 V
Date Time Interrogation Session: 20190107191233
Implantable Lead Implant Date: 20160318
Implantable Pulse Generator Implant Date: 20160318
Lead Channel Impedance Value: 455 Ohm
Lead Channel Pacing Threshold Amplitude: 0.875 V
Lead Channel Pacing Threshold Pulse Width: 0.4 ms
Lead Channel Setting Pacing Pulse Width: 0.4 ms
Lead Channel Setting Sensing Sensitivity: 2 mV
MDC IDC LEAD LOCATION: 753860
MDC IDC MSMT BATTERY REMAINING LONGEVITY: 79 mo
MDC IDC MSMT LEADCHNL RA IMPEDANCE VALUE: 0 Ohm
MDC IDC SET LEADCHNL RV PACING AMPLITUDE: 2.5 V
MDC IDC STAT BRADY RV PERCENT PACED: 67 %

## 2017-06-08 ENCOUNTER — Ambulatory Visit: Payer: Medicare Other | Admitting: Family Medicine

## 2017-06-08 ENCOUNTER — Encounter: Payer: Self-pay | Admitting: Family Medicine

## 2017-07-25 ENCOUNTER — Other Ambulatory Visit: Payer: Self-pay | Admitting: Cardiovascular Disease

## 2017-08-02 ENCOUNTER — Other Ambulatory Visit: Payer: Self-pay | Admitting: Cardiovascular Disease

## 2017-08-07 ENCOUNTER — Ambulatory Visit (INDEPENDENT_AMBULATORY_CARE_PROVIDER_SITE_OTHER): Payer: Medicare Other | Admitting: *Deleted

## 2017-08-07 DIAGNOSIS — I442 Atrioventricular block, complete: Secondary | ICD-10-CM

## 2017-08-08 NOTE — Progress Notes (Signed)
Remote pacemaker transmission.   

## 2017-08-10 ENCOUNTER — Encounter: Payer: Self-pay | Admitting: Cardiology

## 2017-08-22 LAB — CUP PACEART REMOTE DEVICE CHECK
Battery Impedance: 645 Ohm
Battery Voltage: 2.76 V
Brady Statistic RV Percent Paced: 68 %
Implantable Lead Location: 753860
Implantable Pulse Generator Implant Date: 20160318
Lead Channel Impedance Value: 0 Ohm
Lead Channel Pacing Threshold Pulse Width: 0.4 ms
Lead Channel Setting Pacing Amplitude: 2.5 V
Lead Channel Setting Pacing Pulse Width: 0.4 ms
Lead Channel Setting Sensing Sensitivity: 2 mV
MDC IDC LEAD IMPLANT DT: 20160318
MDC IDC MSMT BATTERY REMAINING LONGEVITY: 73 mo
MDC IDC MSMT LEADCHNL RV IMPEDANCE VALUE: 456 Ohm
MDC IDC MSMT LEADCHNL RV PACING THRESHOLD AMPLITUDE: 0.875 V
MDC IDC SESS DTM: 20190408142139

## 2017-10-04 ENCOUNTER — Encounter: Payer: Self-pay | Admitting: Family Medicine

## 2017-10-04 ENCOUNTER — Encounter (HOSPITAL_COMMUNITY): Payer: Self-pay | Admitting: Emergency Medicine

## 2017-10-04 ENCOUNTER — Ambulatory Visit (HOSPITAL_COMMUNITY)
Admission: EM | Admit: 2017-10-04 | Discharge: 2017-10-04 | Disposition: A | Discharge status: Home / Self Care | Payer: Medicare Other | Attending: Internal Medicine | Admitting: Internal Medicine

## 2017-10-04 ENCOUNTER — Other Ambulatory Visit: Payer: Self-pay

## 2017-10-04 DIAGNOSIS — S41111A Laceration without foreign body of right upper arm, initial encounter: Secondary | ICD-10-CM | POA: Diagnosis not present

## 2017-10-04 MED ORDER — TETANUS-DIPHTH-ACELL PERTUSSIS 5-2.5-18.5 LF-MCG/0.5 IM SUSP
INTRAMUSCULAR | Status: AC
Start: 2017-10-04 — End: 2017-10-04
  Filled 2017-10-04: qty 0.5

## 2017-10-04 MED ORDER — TETANUS-DIPHTH-ACELL PERTUSSIS 5-2.5-18.5 LF-MCG/0.5 IM SUSP
0.5000 mL | Freq: Once | INTRAMUSCULAR | Status: AC
  Administered 2017-10-04: 0.5 mL via INTRAMUSCULAR

## 2017-10-04 NOTE — Discharge Instructions (Signed)
We updated your tetanus shot today  Please return in 7 to 10 days to have sutures removed.  Please keep clean and dry, wash twice daily with warm soapy water.  Dry really well afterwards.  Please return if developing signs of infection including increased redness, swelling, drainage from wound or increased pain.

## 2017-10-04 NOTE — ED Provider Notes (Signed)
[facility]    CSN: [id number] Arrival date & time: 10/04/17  1311     History   Chief Complaint Chief Complaint  Patient presents with  . Fall    HPI Dakota [name] is a 84 y.o. male history of hypertension, hyperlipidemia presenting today for evaluation of the laceration after a fall.  Patient states that he was in his workshop earlier today and accidentally slipped and fell.  Believes he cut his arm on a piece of metal.  He did not initially have pain, realize laceration was present when he saw blood.  Fall was witnessed by his son, denies loss of consciousness or hitting head.  Able to get back up on his own.  Denies any changes in vision, nausea, vomiting, headache, chest pain, shortness of breath.  Is unsure when his last tetanus shot was.  Takes aspirin daily, denies other anticoagulation.  HPI  Past Medical History:  Diagnosis Date  . Anxiety   . Back pain, chronic   . Complete heart block ([place]) 07/18/2014   Medtronic [place] model [id number] (serial number [id number])  singe lead PPM  . GERD (gastroesophageal reflux disease) 10/2002  . Hyperlipidemia 12/1994  . Hypertension   . Hypokalemia   . Lung collapse 03/1991   Fall  . Permanent atrial fibrillation ([place])    Refused coumadin therapy    Patient Active Problem List   Diagnosis Date Noted  . Caregiver stress 10/05/2015  . Hematuria 04/06/2015  . ARF (acute renal failure) ([place]) 07/18/2014  . Cardiogenic shock ([place]) 07/18/2014  . Atrial fibrillation with slow ventricular response ([place])   . NSTEMI (non-ST elevated myocardial infarction) ([place])   . CHB (complete heart block) - s/p MDT 1 lead PPM 07/17/2014  . Advance care planning 07/03/2014  . Sebaceous cyst 04/11/2013  . Medicare annual wellness visit, subsequent 10/28/2012  . RIB PAIN, RIGHT SIDED 01/02/2009  . CAD, NATIVE VESSEL 11/25/2008  . Unspecified vitamin D deficiency 11/24/2008  . RECENT RET DETACH PARTIAL W/SINGLE DEFECT 11/24/2008  .  ESSENTIAL HYPERTENSION, BENIGN 07/21/2008  . HLD (hyperlipidemia) 05/22/2007  . Atrial fibrillation ([place]) 05/22/2007  . GERD 05/22/2007  . GASTRIC ULCER, ACUTE, HEMORRHAGE 05/22/2007  . Hyperglycemia 05/22/2007    Past Surgical History:  Procedure Laterality Date  . BACK SURGERY  11/91   with hardware fixation  . CARDIAC CATHETERIZATION  06/2004   30 % stenosis, EF normal  . ESOPHAGOGASTRODUODENOSCOPY N/A 01/06/2016   Procedure: ESOPHAGOGASTRODUODENOSCOPY (EGD);  Surgeon: Otis Brace, MD;  Location: [facility];  Service: Gastroenterology;  Laterality: N/A;  . EYE SURGERY     Retinal bubble surgery, cataract   . LEFT HEART CATHETERIZATION WITH CORONARY ANGIOGRAM N/A 07/18/2014   Procedure: LEFT HEART CATHETERIZATION WITH CORONARY ANGIOGRAM;  Surgeon: Jettie Booze, MD; [facility] [place], LAD mild dz, D1 80%, D2 OK, CFX system OK, RCA OK, PDA 100%, med rx  . PERMANENT PACEMAKER INSERTION N/A 07/18/2014   Procedure: PERMANENT PACEMAKER INSERTION;  Surgeon: Thompson Grayer, MD; Medtronic [place] model [phone] (serial number [id number])    . TEMPORARY PACEMAKER INSERTION N/A 07/18/2014   Procedure: TEMPORARY PACEMAKER INSERTION;  Surgeon: Peter M Martinique, MD;  Location: [facility] CATH LAB;  Service: Cardiovascular;  Laterality: N/A;       Home Medications    Prior to Admission medications   Medication Sig Start Date End Date Taking? Authorizing Provider  ALPRAZolam Duanne Moron) 0.5 MG tablet TAKE ONE-HALF TABLET BY MOUTH TWICE DAILY AS NEEDED FOR ANXIETY 01/09/17   [name]  S, MD  aspirin 81 MG tablet Take 81 mg by mouth 2 (two) times daily.     [provider]  calcium carbonate (TUMS - DOSED IN MG ELEMENTAL CALCIUM) 500 MG chewable tablet Chew 1 tablet by mouth daily as needed for indigestion.     [provider]  carvedilol (COREG) 25 MG tablet TAKE 1 TABLET BY MOUTH TWICE DAILY WITH  A  MEAL. 08/03/17   Josue Hector, MD  docusate sodium (COLACE) 100 MG capsule Take 1 capsule  (100 mg total) by mouth daily as needed for mild constipation. 01/23/17   Tonia Ghent, MD  hydrochlorothiazide (MICROZIDE) 12.5 MG capsule TAKE ONE CAPSULE BY MOUTH ONCE DAILY 07/27/17   Josue Hector, MD  magnesium hydroxide (MILK OF MAGNESIA) 400 MG/5ML suspension Take by mouth daily as needed for mild constipation.    [provider]  nitroGLYCERIN (NITROSTAT) 0.4 MG SL tablet Place 1 tablet (0.4 mg total) under the tongue every 5 (five) minutes as needed for chest pain. 01/07/15   Josue Hector, MD  pantoprazole (PROTONIX) 40 MG tablet Take 1 tablet (40 mg total) by mouth daily. 01/06/16   Domenic Moras, PA-C  ranitidine (ZANTAC) 150 MG tablet TAKE 1 TABLET BY MOUTH ONCE DAILY 07/27/17   Josue Hector, MD    Family History Family History  Problem Relation Age of Onset  . Pneumonia Mother   . Hip fracture Mother   . Cancer Father        splenic  . Heart disease Sister        Pacer placed  . Cancer Brother        throat and lung  . Cancer Brother        prostate, age 68  . Prostate cancer Brother   . Heart disease Son   . Stroke Neg Hx   . Colon cancer Neg Hx     Social History Social History   Tobacco Use  . Smoking status: Former Smoker    Packs/day: 1.00    Years: 15.00    Pack years: 15.00    Last attempt to quit: 05/02/1953    Years since quitting: 64.4  . Smokeless tobacco: Current User    Types: Chew  . Tobacco comment: Quit smoking over 50 years ago  Substance Use Topics  . Alcohol use: No  . Drug use: No     Allergies   Warfarin and related; Atorvastatin; Ezetimibe; Diazepam; and Penicillins   Review of Systems Review of Systems  Constitutional: Negative for activity change, appetite change and fever.  HENT: Negative for trouble swallowing.   Eyes: Negative for visual disturbance.  Respiratory: Negative for shortness of breath.   Cardiovascular: Negative for chest pain.  Gastrointestinal: Negative for abdominal pain, nausea and vomiting.    Musculoskeletal: Positive for myalgias. Negative for arthralgias, gait problem, neck pain and neck stiffness.  Skin: Positive for wound.  Neurological: Negative for dizziness, syncope, speech difficulty, weakness, light-headedness, numbness and headaches.     Physical Exam Triage Vital Signs ED Triage Vitals  Enc Vitals Group     BP 10/04/17 1336 129/78     Pulse Rate 10/04/17 1336 67     Resp 10/04/17 1336 18     Temp 10/04/17 1336 (!) 96.8 F (36 C)     Temp Source 10/04/17 1336 Oral     SpO2 10/04/17 1336 97 %     Weight --      Height --  Head Circumference --      Peak Flow --      Pain Score 10/04/17 1333 0     Pain Loc --      Pain Edu? --      Excl. in [place]? --    No data found.  Updated Vital Signs BP 129/78 (BP Location: Left Arm)   Pulse 67   Temp (!) 96.8 F (36 C) (Oral)   Resp 18   SpO2 97%   Visual Acuity Right Eye Distance:   Left Eye Distance:   Bilateral Distance:    Right Eye Near:   Left Eye Near:    Bilateral Near:     Physical Exam  Constitutional: He is oriented to person, place, and time. He appears well-developed and well-nourished.  HENT:  Head: Normocephalic and atraumatic.  Mouth/Throat: Oropharynx is clear and moist.  Hard of hearing  Eyes: Pupils are equal, round, and reactive to light. Conjunctivae and EOM are normal.  Neck: Neck supple.  Cardiovascular: Normal rate and regular rhythm.  No murmur heard. Pulmonary/Chest: Effort normal and breath sounds normal. No respiratory distress.  Abdominal: Soft. There is no tenderness.  Musculoskeletal: He exhibits no edema.  Neurological: He is alert and oriented to person, place, and time. No cranial nerve deficit. Coordination normal.  GCS 15  Skin: Skin is warm and dry.  Large scrape to right forearm extending from elbow to distal third of forearm.  7 cm area of approximately 3 mm deep, slightly gaping.  Psychiatric: He has a normal mood and affect.  Nursing note and vitals  reviewed.      UC Treatments / Results  Labs (all labs ordered are listed, but only abnormal results are displayed) Labs Reviewed - No data to display  EKG None  Radiology No results found.  Procedures Laceration Repair Date/Time: 10/04/2017 2:44 PM Performed by: Marirose Deveney, Elesa Hacker, PA-C Authorized by: Wynona Luna, MD   Consent:    Consent obtained:  Verbal   Consent given by:  Patient   Risks discussed:  Infection, pain and poor cosmetic result   Alternatives discussed:  No treatment Anesthesia (see MAR for exact dosages):    Anesthesia method:  Local infiltration   Local anesthetic:  Lidocaine 2% WITH epi Laceration details:    Location:  Shoulder/arm   Shoulder/arm location:  R lower arm   Length (cm):  7   Depth (mm):  4 Repair type:    Repair type:  Simple Pre-procedure details:    Preparation:  Patient was prepped and draped in usual sterile fashion Exploration:    Hemostasis achieved with:  Direct pressure   Wound exploration: wound explored through full range of motion     Wound extent: no foreign bodies/material noted, no muscle damage noted and no vascular damage noted     Contaminated: no   Treatment:    Area cleansed with:  Hibiclens   Amount of cleaning:  Standard   Irrigation solution:  Tap water   Irrigation volume:  120 mL   Irrigation method:  Syringe   Visualized foreign bodies/material removed: no   Skin repair:    Repair method:  Sutures   Suture size:  5-0   Suture material:  Prolene   Suture technique:  Horizontal mattress   Number of sutures:  7 Approximation:    Approximation:  Close Post-procedure details:    Dressing:  Bulky dressing   Patient tolerance of procedure:  Tolerated well, no immediate complications   (  including critical care time)  Medications Ordered in UC Medications  Tdap (BOOSTRIX) injection 0.5 mL (0.5 mLs Intramuscular Given 10/04/17 1444)    Initial Impression / Assessment and Plan / UC Course  I  have reviewed the triage vital signs and the nursing notes.  Pertinent labs & imaging results that were available during my care of the patient were reviewed by me and considered in my medical decision making (see chart for details).     Patient with laceration after fall.  No focal neuro deficits.  Vital signs stable.  Laceration repair performed.  Discussed wound care, return in 7 to 10 days for removal.  Discussed signs of infection to return sooner for.  Will update tetanus.Discussed strict return precautions. Patient verbalized understanding and is agreeable with plan.  Final Clinical Impressions(s) / UC Diagnoses   Final diagnoses:  Laceration of right upper extremity, initial encounter     Discharge Instructions     We updated your tetanus shot today  Please return in 7 to 10 days to have sutures removed.  Please keep clean and dry, wash twice daily with warm soapy water.  Dry really well afterwards.  Please return if developing signs of infection including increased redness, swelling, drainage from wound or increased pain.    ED Prescriptions    None     Controlled Substance Prescriptions [place] Controlled Substance Registry consulted? Not Applicable   Janith Lima, [place] 10/04/17 1446

## 2017-10-04 NOTE — ED Triage Notes (Signed)
Slipped and fell today.  Cut to right forearm patient has scrapes to back.  Denies hitting head.  Another son was there and witnessed fall

## 2017-10-05 ENCOUNTER — Encounter: Payer: Self-pay | Admitting: Family Medicine

## 2017-10-09 ENCOUNTER — Encounter: Payer: Medicare Other | Admitting: Family Medicine

## 2017-10-12 ENCOUNTER — Encounter: Payer: Self-pay | Admitting: Family Medicine

## 2017-10-13 ENCOUNTER — Other Ambulatory Visit: Payer: Self-pay | Admitting: *Deleted

## 2017-10-13 ENCOUNTER — Ambulatory Visit (HOSPITAL_COMMUNITY)
Admission: EM | Admit: 2017-10-13 | Discharge: 2017-10-13 | Disposition: A | Discharge status: Home / Self Care | Payer: Medicare Other

## 2017-10-13 NOTE — ED Notes (Signed)
Bed: UC01 Expected date:  Expected time:  Means of arrival:  Comments: 

## 2017-10-13 NOTE — ED Triage Notes (Signed)
Pt here for suture removal to the right FA. Area well healed, clean and dry. 7 sutures removed.

## 2017-10-13 NOTE — Telephone Encounter (Signed)
MyChart RF request:  Alprazolam Last office visit:   01/23/17 Last Filled:    60 tablet 1 01/09/2017  Annual exam scheduled in September Please advise.

## 2017-10-14 MED ORDER — ALPRAZOLAM 0.5 MG PO TABS: ORAL_TABLET | ORAL | 0 refills | Status: DC

## 2017-10-14 NOTE — Telephone Encounter (Signed)
Sent. Thanks.   

## 2017-10-31 NOTE — Progress Notes (Signed)
Electrophysiology Office Note Date: 11/01/2017  ID:  Dakota [name], DOB [birth year], MRN [id number]  PCP: Tonia Ghent, MD Primary Cardiologist: Johnsie Cancel Electrophysiologist: All[place]  CC: Pacemaker follow-up  Dakota [name] is a 84 y.o. male seen today for Dr Rayann Heman.  He presents today for routine electrophysiology followup.  Since last being seen in our clinic, the patient reports doing relatively well.  He had a mechanical fall recently and required stitches to his arm. He continues to be the primary caregiver for his wife who is bedbound.  He denies chest pain, palpitations, dyspnea, PND, orthopnea, nausea, vomiting, dizziness, syncope, edema, weight gain, or early satiety.  Device History: MDT single chamber PPM implanted 2016 for complete heart block    Past Medical History:  Diagnosis Date  . Anxiety   . Back pain, chronic   . Complete heart block ([place]) 07/18/2014   Medtronic [place] model [id number] (serial number [id number])  singe lead PPM  . GERD (gastroesophageal reflux disease) 10/2002  . Hyperlipidemia 12/1994  . Hypertension   . Hypokalemia   . Lung collapse 03/1991   Fall  . Permanent atrial fibrillation ([name])    Refused coumadin therapy   Past Surgical History:  Procedure Laterality Date  . BACK SURGERY  11/91   with hardware fixation  . CARDIAC CATHETERIZATION  06/2004   30 % stenosis, EF normal  . ESOPHAGOGASTRODUODENOSCOPY N/A 01/06/2016   Procedure: ESOPHAGOGASTRODUODENOSCOPY (EGD);  Surgeon: Otis Brace, MD;  Location: [facility];  Service: Gastroenterology;  Laterality: N/A;  . EYE SURGERY     Retinal bubble surgery, cataract   . LEFT HEART CATHETERIZATION WITH CORONARY ANGIOGRAM N/A 07/18/2014   Procedure: LEFT HEART CATHETERIZATION WITH CORONARY ANGIOGRAM;  Surgeon: Jettie Booze, MD; [facility] [place], LAD mild dz, D1 80%, D2 OK, CFX system OK, RCA OK, PDA 100%, med rx  . PERMANENT PACEMAKER INSERTION N/A 07/18/2014   Procedure: PERMANENT PACEMAKER  INSERTION;  Surgeon: Thompson Grayer, MD; Medtronic [place] model [phone] (serial number [id number])    . TEMPORARY PACEMAKER INSERTION N/A 07/18/2014   Procedure: TEMPORARY PACEMAKER INSERTION;  Surgeon: Peter M Martinique, MD;  [facility] C[facility] LAB;  Service: Cardiovascular;  Laterality: N/A;    Current Outpatient Medications  Medication Sig Dispense Refill  . ALPRAZolam (XANAX) 0.5 MG tablet TAKE ONE-HALF TABLET BY MOUTH TWICE DAILY AS NEEDED FOR ANXIETY 60 tablet 0  . aspirin 81 MG tablet Take 81 mg by mouth 2 (two) times daily.     . calcium carbonate (TUMS - DOSED IN MG ELEMENTAL CALCIUM) 500 MG chewable tablet Chew 1 tablet by mouth daily as needed for indigestion.     . carvedilol (COREG) 25 MG tablet TAKE 1 TABLET BY MOUTH TWICE DAILY WITH  A  MEAL. 180 tablet 1  . docusate sodium (COLACE) 100 MG capsule Take 1 capsule (100 mg total) by mouth daily as needed for mild constipation.    . hydrochlorothiazide (MICROZIDE) 12.5 MG capsule TAKE ONE CAPSULE BY MOUTH ONCE DAILY 90 capsule 1  . MAGNESIUM HYDROXIDE PO Take 1 tablet by mouth as directed.    . nitroGLYCERIN (NITROSTAT) 0.4 MG SL tablet Place 1 tablet (0.4 mg total) under the tongue every 5 (five) minutes as needed for chest pain. 25 tablet 3  . pantoprazole (PROTONIX) 40 MG tablet Take 1 tablet (40 mg total) by mouth daily. 30 tablet 0  . ranitidine (ZANTAC) 150 MG tablet TAKE 1 TABLET BY MOUTH ONCE DAILY 90 tablet 1   No current  facility-administered medications for this visit.     Allergies:   Warfarin and related; Atorvastatin; Ezetimibe; Diazepam; and Penicillins   Social History: Social History   Socioeconomic History  . Marital status: Married    Spouse name: Not on file  . Number of children: 4  . Years of education: Not on file  . Highest education level: Not on file  Occupational History  . Occupation: retired    Comment: Barista  . Financial resource strain: Not on file  . Food insecurity:     Worry: Not on file    Inability: Not on file  . Transportation needs:    Medical: Not on file    Non-medical: Not on file  Tobacco Use  . Smoking status: Former Smoker    Packs/day: 1.00    Years: 15.00    Pack years: 15.00    Last attempt to quit: 05/02/1953    Years since quitting: 64.5  . Smokeless tobacco: Current User    Types: Chew  . Tobacco comment: Quit smoking over 50 years ago  Substance and Sexual Activity  . Alcohol use: No  . Drug use: No  . Sexual activity: Never  Lifestyle  . Physical activity:    Days per week: Not on file    Minutes per session: Not on file  . Stress: Not on file  Relationships  . Social connections:    Talks on phone: Not on file    Gets together: Not on file    Attends religious service: Not on file    Active member of club or organization: Not on file    Attends meetings of clubs or organizations: Not on file    Relationship status: Not on file  . Intimate partner violence:    Fear of current or ex partner: Not on file    Emotionally abused: Not on file    Physically abused: Not on file    Forced sexual activity: Not on file  Other Topics Concern  . Not on file  Social History Narrative   Retired from [employer], building/construction   Married 1953   4 kids   Caring for his wife at home- source of anxiety for patient    Family History: Family History  Problem Relation Age of Onset  . Pneumonia Mother   . Hip fracture Mother   . Cancer Father        splenic  . Heart disease Sister        Pacer placed  . Cancer Brother        throat and lung  . Cancer Brother        prostate, age 3  . Prostate cancer Brother   . Heart disease Son   . Stroke Neg Hx   . Colon cancer Neg Hx      Review of Systems: All other systems reviewed and are otherwise negative except as noted above.   Physical Exam: VS:  BP 124/84   Pulse 75   Ht 5' 7.75" (1.721 m)   Wt 188 lb 3.2 oz (85.4 kg)   BMI 28.83 kg/m  , BMI Body mass  index is 28.83 kg/m.  GEN- The patient is elderly appearing, alert and oriented x 3 today.   HEENT: normocephalic, atraumatic; sclera clear, conjunctiva pink; hearing intact; oropharynx clear; neck supple  Lungs- Clear to ausculation bilaterally, normal work of breathing.  No wheezes, rales, rhonchi Heart- Irregular rate and rhythm   GI- soft,  non-tender, non-distended, bowel sounds present  Extremities- no clubbing, cyanosis, or edema  MS- no significant deformity or atrophy Skin- warm and dry, no rash or lesion; PPM pocket well healed Psych- euthymic mood, full affect Neuro- strength and sensation are intact  PPM Interrogation- reviewed in detail today,  See PACEART report  EKG:  EKG is not ordered today.  Recent Labs: 01/23/2017: ALT 19; BUN 26; Creatinine, Ser 1.35; Hemoglobin 15.4; Platelets 245.0; Potassium 5.2; Sodium 139; TSH 2.56   Wt Readings from Last 3 Encounters:  11/01/17 188 lb 3.2 oz (85.4 kg)  01/23/17 195 lb 8 oz (88.7 kg)  01/23/17 195 lb 8 oz (88.7 kg)     Other studies Reviewed: Additional studies/ records that were reviewed today include: Dr Johnsie Cancel and Dr Jackalyn Lombard office notes  Assessment and Plan:  1.  Complete heart block Normal PPM function See Pace Art report No changes today  2.  Permanent atrial fibrillation CHADS2VASC is 4 He remains clear in his decision to decline [facility]  3.  CAD Stable No change required today  4.  HTN Stable No change required today   Current medicines are reviewed at length with the patient today.   The patient does not have concerns regarding his medicines.  The following changes were made today:  none  Labs/ tests ordered today include: none Orders Placed This Encounter  Procedures  . EKG 12-Lead     Disposition:   Follow up with Dr Johnsie Cancel as scheduled, [facility], me in 1 year     Signed, Chanetta Marshall, NP 11/01/2017 12:46 PM  [facility] [address] [place]  [address] [phone] (office) [phone] (fax)

## 2017-11-01 ENCOUNTER — Ambulatory Visit (INDEPENDENT_AMBULATORY_CARE_PROVIDER_SITE_OTHER): Payer: Medicare Other | Admitting: Nurse Practitioner

## 2017-11-01 ENCOUNTER — Encounter: Payer: Self-pay | Admitting: Nurse Practitioner

## 2017-11-01 VITALS — BP 124/84 | HR 75 | Ht 67.75 in | Wt 188.2 lb

## 2017-11-01 DIAGNOSIS — I482 Chronic atrial fibrillation: Secondary | ICD-10-CM | POA: Diagnosis not present

## 2017-11-01 DIAGNOSIS — I442 Atrioventricular block, complete: Secondary | ICD-10-CM

## 2017-11-01 DIAGNOSIS — I4821 Permanent atrial fibrillation: Secondary | ICD-10-CM

## 2017-11-01 DIAGNOSIS — I2510 Atherosclerotic heart disease of native coronary artery without angina pectoris: Secondary | ICD-10-CM

## 2017-11-01 DIAGNOSIS — I10 Essential (primary) hypertension: Secondary | ICD-10-CM

## 2017-11-01 NOTE — Patient Instructions (Addendum)
Medication Instructions:   Your physician recommends that you continue on your current medications as directed. Please refer to the Current Medication list given to you today.   If you need a refill on your cardiac medications before your next appointment, please call your pharmacy.  Labwork: NONE ORDERED  TODAY    Testing/Procedures: NONE ORDERED  TODAY    Follow-Up: Your physician wants you to follow-up in: [place] will receive a reminder letter in the mail two months in advance. If you don't receive a letter, please call our office to schedule the follow-up appointment.  Remote monitoring is used to monitor your Pacemaker of ICD from home. This monitoring reduces the number of office visits required to check your device to one time per year. It allows [place] to keep an eye on the functioning of your device to ensure it is working properly. You are scheduled for a device check from home on . 11-06-17 You may send your transmission at any time that day. If you have a wireless device, the transmission will be sent automatically. After your physician reviews your transmission, you will receive a postcard with your next transmission date. '   Any Other Special Instructions Will Be Listed Below (If Applicable).

## 2017-11-06 ENCOUNTER — Ambulatory Visit (INDEPENDENT_AMBULATORY_CARE_PROVIDER_SITE_OTHER): Payer: Medicare Other | Admitting: *Deleted

## 2017-11-06 DIAGNOSIS — I442 Atrioventricular block, complete: Secondary | ICD-10-CM | POA: Diagnosis not present

## 2017-11-07 NOTE — Progress Notes (Signed)
Remote pacemaker transmission.   

## 2017-11-08 LAB — CUP PACEART INCLINIC DEVICE CHECK
Implantable Lead Implant Date: 20160318
Implantable Pulse Generator Implant Date: 20160318
MDC IDC LEAD LOCATION: 753860
MDC IDC SESS DTM: 20190710125027

## 2017-11-16 ENCOUNTER — Emergency Department (HOSPITAL_COMMUNITY): Payer: Medicare Other

## 2017-11-16 ENCOUNTER — Inpatient Hospital Stay (HOSPITAL_COMMUNITY)
Admission: EM | Admit: 2017-11-16 | Discharge: 2017-11-21 | DRG: 024 | Disposition: A | Discharge status: Rehabilitation Facility | Payer: Medicare Other | Attending: Neurology | Admitting: Neurology

## 2017-11-16 ENCOUNTER — Other Ambulatory Visit: Payer: Self-pay

## 2017-11-16 ENCOUNTER — Inpatient Hospital Stay (HOSPITAL_COMMUNITY): Payer: Medicare Other | Admitting: Certified Registered"

## 2017-11-16 ENCOUNTER — Encounter (HOSPITAL_COMMUNITY): Payer: Self-pay

## 2017-11-16 ENCOUNTER — Encounter (HOSPITAL_COMMUNITY)
Admission: EM | Disposition: A | Discharge status: Rehabilitation Facility | Payer: Self-pay | Source: Home / Self Care | Attending: Neurology

## 2017-11-16 DIAGNOSIS — I959 Hypotension, unspecified: Secondary | ICD-10-CM | POA: Diagnosis present

## 2017-11-16 DIAGNOSIS — I442 Atrioventricular block, complete: Secondary | ICD-10-CM | POA: Diagnosis present

## 2017-11-16 DIAGNOSIS — Z636 Dependent relative needing care at home: Secondary | ICD-10-CM

## 2017-11-16 DIAGNOSIS — I2510 Atherosclerotic heart disease of native coronary artery without angina pectoris: Secondary | ICD-10-CM | POA: Diagnosis not present

## 2017-11-16 DIAGNOSIS — G8929 Other chronic pain: Secondary | ICD-10-CM | POA: Diagnosis present

## 2017-11-16 DIAGNOSIS — R410 Disorientation, unspecified: Secondary | ICD-10-CM | POA: Diagnosis not present

## 2017-11-16 DIAGNOSIS — R7303 Prediabetes: Secondary | ICD-10-CM | POA: Diagnosis present

## 2017-11-16 DIAGNOSIS — K922 Gastrointestinal hemorrhage, unspecified: Secondary | ICD-10-CM | POA: Diagnosis not present

## 2017-11-16 DIAGNOSIS — I824Y9 Acute embolism and thrombosis of unspecified deep veins of unspecified proximal lower extremity: Secondary | ICD-10-CM

## 2017-11-16 DIAGNOSIS — R0902 Hypoxemia: Secondary | ICD-10-CM | POA: Diagnosis not present

## 2017-11-16 DIAGNOSIS — G8194 Hemiplegia, unspecified affecting left nondominant side: Secondary | ICD-10-CM | POA: Diagnosis not present

## 2017-11-16 DIAGNOSIS — Z8042 Family history of malignant neoplasm of prostate: Secondary | ICD-10-CM

## 2017-11-16 DIAGNOSIS — I252 Old myocardial infarction: Secondary | ICD-10-CM | POA: Diagnosis not present

## 2017-11-16 DIAGNOSIS — D72829 Elevated white blood cell count, unspecified: Secondary | ICD-10-CM

## 2017-11-16 DIAGNOSIS — I69318 Other symptoms and signs involving cognitive functions following cerebral infarction: Secondary | ICD-10-CM | POA: Diagnosis not present

## 2017-11-16 DIAGNOSIS — I480 Paroxysmal atrial fibrillation: Secondary | ICD-10-CM | POA: Diagnosis present

## 2017-11-16 DIAGNOSIS — I10 Essential (primary) hypertension: Secondary | ICD-10-CM | POA: Diagnosis present

## 2017-11-16 DIAGNOSIS — S0990XA Unspecified injury of head, initial encounter: Secondary | ICD-10-CM | POA: Diagnosis not present

## 2017-11-16 DIAGNOSIS — R29725 NIHSS score 25: Secondary | ICD-10-CM | POA: Diagnosis not present

## 2017-11-16 DIAGNOSIS — R1311 Dysphagia, oral phase: Secondary | ICD-10-CM | POA: Diagnosis present

## 2017-11-16 DIAGNOSIS — Z808 Family history of malignant neoplasm of other organs or systems: Secondary | ICD-10-CM

## 2017-11-16 DIAGNOSIS — Z801 Family history of malignant neoplasm of trachea, bronchus and lung: Secondary | ICD-10-CM | POA: Diagnosis not present

## 2017-11-16 DIAGNOSIS — I69028 Other speech and language deficits following nontraumatic subarachnoid hemorrhage: Secondary | ICD-10-CM | POA: Diagnosis not present

## 2017-11-16 DIAGNOSIS — I639 Cerebral infarction, unspecified: Secondary | ICD-10-CM

## 2017-11-16 DIAGNOSIS — Z800 Family history of malignant neoplasm of digestive organs: Secondary | ICD-10-CM

## 2017-11-16 DIAGNOSIS — I69322 Dysarthria following cerebral infarction: Secondary | ICD-10-CM | POA: Diagnosis not present

## 2017-11-16 DIAGNOSIS — F05 Delirium due to known physiological condition: Secondary | ICD-10-CM

## 2017-11-16 DIAGNOSIS — R739 Hyperglycemia, unspecified: Secondary | ICD-10-CM | POA: Diagnosis present

## 2017-11-16 DIAGNOSIS — N179 Acute kidney failure, unspecified: Secondary | ICD-10-CM | POA: Diagnosis present

## 2017-11-16 DIAGNOSIS — R571 Hypovolemic shock: Secondary | ICD-10-CM | POA: Diagnosis not present

## 2017-11-16 DIAGNOSIS — I63411 Cerebral infarction due to embolism of right middle cerebral artery: Secondary | ICD-10-CM | POA: Diagnosis not present

## 2017-11-16 DIAGNOSIS — Z950 Presence of cardiac pacemaker: Secondary | ICD-10-CM

## 2017-11-16 DIAGNOSIS — K219 Gastro-esophageal reflux disease without esophagitis: Secondary | ICD-10-CM | POA: Diagnosis present

## 2017-11-16 DIAGNOSIS — E46 Unspecified protein-calorie malnutrition: Secondary | ICD-10-CM | POA: Diagnosis not present

## 2017-11-16 DIAGNOSIS — Z8249 Family history of ischemic heart disease and other diseases of the circulatory system: Secondary | ICD-10-CM | POA: Diagnosis not present

## 2017-11-16 DIAGNOSIS — I4891 Unspecified atrial fibrillation: Secondary | ICD-10-CM | POA: Diagnosis not present

## 2017-11-16 DIAGNOSIS — G4720 Circadian rhythm sleep disorder, unspecified type: Secondary | ICD-10-CM | POA: Diagnosis present

## 2017-11-16 DIAGNOSIS — I63512 Cerebral infarction due to unspecified occlusion or stenosis of left middle cerebral artery: Secondary | ICD-10-CM | POA: Diagnosis not present

## 2017-11-16 DIAGNOSIS — R1312 Dysphagia, oropharyngeal phase: Secondary | ICD-10-CM | POA: Diagnosis not present

## 2017-11-16 DIAGNOSIS — R4182 Altered mental status, unspecified: Secondary | ICD-10-CM | POA: Diagnosis not present

## 2017-11-16 DIAGNOSIS — F17220 Nicotine dependence, chewing tobacco, uncomplicated: Secondary | ICD-10-CM | POA: Diagnosis present

## 2017-11-16 DIAGNOSIS — R29810 Facial weakness: Secondary | ICD-10-CM | POA: Diagnosis present

## 2017-11-16 DIAGNOSIS — W19XXXA Unspecified fall, initial encounter: Secondary | ICD-10-CM | POA: Diagnosis not present

## 2017-11-16 DIAGNOSIS — H518 Other specified disorders of binocular movement: Secondary | ICD-10-CM | POA: Diagnosis not present

## 2017-11-16 DIAGNOSIS — H5340 Unspecified visual field defects: Secondary | ICD-10-CM | POA: Diagnosis present

## 2017-11-16 DIAGNOSIS — R29713 NIHSS score 13: Secondary | ICD-10-CM | POA: Diagnosis present

## 2017-11-16 DIAGNOSIS — D72820 Lymphocytosis (symptomatic): Secondary | ICD-10-CM | POA: Diagnosis not present

## 2017-11-16 DIAGNOSIS — R414 Neurologic neglect syndrome: Secondary | ICD-10-CM | POA: Diagnosis not present

## 2017-11-16 DIAGNOSIS — R1310 Dysphagia, unspecified: Secondary | ICD-10-CM | POA: Diagnosis present

## 2017-11-16 DIAGNOSIS — R0602 Shortness of breath: Secondary | ICD-10-CM | POA: Diagnosis not present

## 2017-11-16 DIAGNOSIS — Z880 Allergy status to penicillin: Secondary | ICD-10-CM

## 2017-11-16 DIAGNOSIS — I63311 Cerebral infarction due to thrombosis of right middle cerebral artery: Secondary | ICD-10-CM | POA: Diagnosis not present

## 2017-11-16 DIAGNOSIS — I63511 Cerebral infarction due to unspecified occlusion or stenosis of right middle cerebral artery: Secondary | ICD-10-CM | POA: Diagnosis not present

## 2017-11-16 DIAGNOSIS — R4189 Other symptoms and signs involving cognitive functions and awareness: Secondary | ICD-10-CM | POA: Diagnosis present

## 2017-11-16 DIAGNOSIS — K222 Esophageal obstruction: Secondary | ICD-10-CM | POA: Diagnosis present

## 2017-11-16 DIAGNOSIS — Z7982 Long term (current) use of aspirin: Secondary | ICD-10-CM

## 2017-11-16 DIAGNOSIS — M549 Dorsalgia, unspecified: Secondary | ICD-10-CM | POA: Diagnosis present

## 2017-11-16 DIAGNOSIS — I6601 Occlusion and stenosis of right middle cerebral artery: Secondary | ICD-10-CM | POA: Diagnosis present

## 2017-11-16 DIAGNOSIS — R471 Dysarthria and anarthria: Secondary | ICD-10-CM | POA: Diagnosis present

## 2017-11-16 DIAGNOSIS — R4781 Slurred speech: Secondary | ICD-10-CM | POA: Diagnosis not present

## 2017-11-16 DIAGNOSIS — Z6828 Body mass index (BMI) 28.0-28.9, adult: Secondary | ICD-10-CM | POA: Diagnosis not present

## 2017-11-16 DIAGNOSIS — I361 Nonrheumatic tricuspid (valve) insufficiency: Secondary | ICD-10-CM | POA: Diagnosis not present

## 2017-11-16 DIAGNOSIS — Z9889 Other specified postprocedural states: Secondary | ICD-10-CM | POA: Diagnosis not present

## 2017-11-16 DIAGNOSIS — Z888 Allergy status to other drugs, medicaments and biological substances status: Secondary | ICD-10-CM

## 2017-11-16 DIAGNOSIS — I517 Cardiomegaly: Secondary | ICD-10-CM | POA: Diagnosis not present

## 2017-11-16 DIAGNOSIS — N390 Urinary tract infection, site not specified: Secondary | ICD-10-CM | POA: Diagnosis not present

## 2017-11-16 DIAGNOSIS — R7989 Other specified abnormal findings of blood chemistry: Secondary | ICD-10-CM | POA: Diagnosis present

## 2017-11-16 DIAGNOSIS — R509 Fever, unspecified: Secondary | ICD-10-CM

## 2017-11-16 DIAGNOSIS — I69391 Dysphagia following cerebral infarction: Secondary | ICD-10-CM | POA: Diagnosis not present

## 2017-11-16 DIAGNOSIS — E785 Hyperlipidemia, unspecified: Secondary | ICD-10-CM | POA: Diagnosis present

## 2017-11-16 DIAGNOSIS — I482 Chronic atrial fibrillation: Secondary | ICD-10-CM | POA: Diagnosis not present

## 2017-11-16 DIAGNOSIS — I69354 Hemiplegia and hemiparesis following cerebral infarction affecting left non-dominant side: Secondary | ICD-10-CM | POA: Diagnosis not present

## 2017-11-16 DIAGNOSIS — K921 Melena: Secondary | ICD-10-CM | POA: Diagnosis not present

## 2017-11-16 HISTORY — PX: IR PERCUTANEOUS ART THROMBECTOMY/INFUSION INTRACRANIAL INC DIAG ANGIO: IMG6087

## 2017-11-16 HISTORY — PX: IR CT HEAD LTD: IMG2386

## 2017-11-16 HISTORY — DX: Food in esophagus causing other injury, initial encounter: T18.128A

## 2017-11-16 HISTORY — DX: Esophageal obstruction: K22.2

## 2017-11-16 HISTORY — PX: RADIOLOGY WITH ANESTHESIA: SHX6223

## 2017-11-16 LAB — I-STAT CG4 LACTIC ACID, ED: Lactic Acid, Venous: 1.9 mmol/L (ref 0.5–1.9)

## 2017-11-16 LAB — MRSA PCR SCREENING: MRSA BY PCR: NEGATIVE

## 2017-11-16 LAB — COMPREHENSIVE METABOLIC PANEL
ALT: 21 U/L (ref 0–44)
ANION GAP: 11 (ref 5–15)
AST: 27 U/L (ref 15–41)
Albumin: 4 g/dL (ref 3.5–5.0)
Alkaline Phosphatase: 59 U/L (ref 38–126)
BUN: 21 mg/dL (ref 8–23)
CHLORIDE: 104 mmol/L (ref 98–111)
CO2: 23 mmol/L (ref 22–32)
CREATININE: 1.25 mg/dL — AB (ref 0.61–1.24)
Calcium: 9.6 mg/dL (ref 8.9–10.3)
GFR, EST AFRICAN AMERICAN: 58 mL/min — AB (ref >60)
GFR, EST NON AFRICAN AMERICAN: 50 mL/min — AB (ref >60)
Glucose, Bld: 124 mg/dL — ABNORMAL HIGH (ref 70–99)
Potassium: 4.2 mmol/L (ref 3.5–5.1)
SODIUM: 138 mmol/L (ref 135–145)
Total Bilirubin: 1.8 mg/dL — ABNORMAL HIGH (ref 0.3–1.2)
Total Protein: 7.6 g/dL (ref 6.5–8.1)

## 2017-11-16 LAB — CBC WITH DIFFERENTIAL/PLATELET
Abs Immature Granulocytes: 0.1 10*3/uL (ref 0.0–0.1)
BASOS ABS: 0.1 10*3/uL (ref 0.0–0.1)
Basophils Relative: 0 %
EOS PCT: 0 %
Eosinophils Absolute: 0 10*3/uL (ref 0.0–0.7)
HCT: 51.5 % (ref 39.0–52.0)
HEMOGLOBIN: 16.7 g/dL (ref 13.0–17.0)
Immature Granulocytes: 1 %
LYMPHS ABS: 1.9 10*3/uL (ref 0.7–4.0)
LYMPHS PCT: 13 %
MCH: 29.5 pg (ref 26.0–34.0)
MCHC: 32.4 g/dL (ref 30.0–36.0)
MCV: 91 fL (ref 78.0–100.0)
MONO ABS: 1.3 10*3/uL — AB (ref 0.1–1.0)
Monocytes Relative: 9 %
Neutro Abs: 11.6 10*3/uL — ABNORMAL HIGH (ref 1.7–7.7)
Neutrophils Relative %: 77 %
Platelets: 204 10*3/uL (ref 150–400)
RBC: 5.66 MIL/uL (ref 4.22–5.81)
RDW: 12.8 % (ref 11.5–15.5)
WBC: 14.9 10*3/uL — ABNORMAL HIGH (ref 4.0–10.5)

## 2017-11-16 LAB — TROPONIN I

## 2017-11-16 LAB — URINALYSIS, ROUTINE W REFLEX MICROSCOPIC
Bilirubin Urine: NEGATIVE
Glucose, UA: 50 mg/dL — AB
KETONES UR: NEGATIVE mg/dL
Leukocytes, UA: NEGATIVE
Nitrite: NEGATIVE
PROTEIN: 100 mg/dL — AB
Specific Gravity, Urine: 1.016 (ref 1.005–1.030)
pH: 7 (ref 5.0–8.0)

## 2017-11-16 LAB — LIPASE, BLOOD: Lipase: 25 U/L (ref 11–51)

## 2017-11-16 LAB — POC OCCULT BLOOD, ED: FECAL OCCULT BLD: NEGATIVE

## 2017-11-16 SURGERY — RADIOLOGY WITH ANESTHESIA
Anesthesia: General

## 2017-11-16 MED ORDER — ONDANSETRON HCL 4 MG/2ML IJ SOLN
INTRAMUSCULAR | Status: DC | PRN
  Administered 2017-11-16: 4 mg via INTRAVENOUS

## 2017-11-16 MED ORDER — VANCOMYCIN HCL 1000 MG IV SOLR
INTRAVENOUS | Status: DC | PRN
  Administered 2017-11-16: 1000 mg via INTRAVENOUS

## 2017-11-16 MED ORDER — ROCURONIUM BROMIDE 10 MG/ML (PF) SYRINGE
PREFILLED_SYRINGE | INTRAVENOUS | Status: DC | PRN
  Administered 2017-11-16: 60 mg via INTRAVENOUS
  Administered 2017-11-16 (×2): 20 mg via INTRAVENOUS

## 2017-11-16 MED ORDER — ACETAMINOPHEN 325 MG PO TABS
650.0000 mg | ORAL_TABLET | ORAL | Status: DC | PRN
  Administered 2017-11-17: 650 mg via ORAL
  Filled 2017-11-16: qty 2

## 2017-11-16 MED ORDER — ACETAMINOPHEN 160 MG/5ML PO SOLN: 650.0000 mg | ORAL | Status: DC | PRN

## 2017-11-16 MED ORDER — IOPAMIDOL (ISOVUE-370) INJECTION 76%
INTRAVENOUS | Status: AC
  Administered 2017-11-16: 95 mL
  Filled 2017-11-16: qty 100

## 2017-11-16 MED ORDER — FENTANYL CITRATE (PF) 250 MCG/5ML IJ SOLN
INTRAMUSCULAR | Status: DC | PRN
  Administered 2017-11-16 (×4): 25 ug via INTRAVENOUS

## 2017-11-16 MED ORDER — PHENYLEPHRINE HCL 10 MG/ML IJ SOLN
INTRAMUSCULAR | Status: DC | PRN
  Administered 2017-11-16: 25 ug/min via INTRAVENOUS

## 2017-11-16 MED ORDER — ACETAMINOPHEN 650 MG RE SUPP: 650.0000 mg | RECTAL | Status: DC | PRN

## 2017-11-16 MED ORDER — SODIUM CHLORIDE 0.9 % IV BOLUS
1000.0000 mL | Freq: Once | INTRAVENOUS | Status: AC
  Administered 2017-11-16: 1000 mL via INTRAVENOUS

## 2017-11-16 MED ORDER — VANCOMYCIN HCL IN DEXTROSE 1-5 GM/200ML-% IV SOLN
INTRAVENOUS | Status: AC
  Filled 2017-11-16: qty 200

## 2017-11-16 MED ORDER — ACETAMINOPHEN 325 MG PO TABS: 650.0000 mg | ORAL_TABLET | ORAL | Status: DC | PRN

## 2017-11-16 MED ORDER — DEXAMETHASONE SODIUM PHOSPHATE 10 MG/ML IJ SOLN
INTRAMUSCULAR | Status: DC | PRN
  Administered 2017-11-16: 10 mg via INTRAVENOUS

## 2017-11-16 MED ORDER — ACETAMINOPHEN 650 MG RE SUPP
650.0000 mg | RECTAL | Status: DC | PRN
  Filled 2017-11-16: qty 1

## 2017-11-16 MED ORDER — NITROGLYCERIN 1 MG/10 ML FOR IR/CATH LAB
INTRA_ARTERIAL | Status: AC | PRN
  Administered 2017-11-16 (×2): 25 ug via INTRA_ARTERIAL

## 2017-11-16 MED ORDER — NICARDIPINE HCL IN NACL 40-0.83 MG/200ML-% IV SOLN
0.0000 mg/h | INTRAVENOUS | Status: DC
  Administered 2017-11-17 (×2): 4 mg/h via INTRAVENOUS
  Filled 2017-11-16 (×2): qty 200

## 2017-11-16 MED ORDER — ASPIRIN 325 MG PO TABS
ORAL_TABLET | ORAL | Status: AC
  Filled 2017-11-16: qty 1

## 2017-11-16 MED ORDER — IOPAMIDOL (ISOVUE-300) INJECTION 61%
INTRAVENOUS | Status: AC
  Administered 2017-11-16: 20 mL
  Filled 2017-11-16: qty 100

## 2017-11-16 MED ORDER — SENNOSIDES-DOCUSATE SODIUM 8.6-50 MG PO TABS: 1.0000 | ORAL_TABLET | Freq: Every evening | ORAL | Status: DC | PRN

## 2017-11-16 MED ORDER — CLOPIDOGREL BISULFATE 300 MG PO TABS
ORAL_TABLET | ORAL | Status: AC
  Filled 2017-11-16: qty 1

## 2017-11-16 MED ORDER — NITROGLYCERIN 1 MG/10 ML FOR IR/CATH LAB
INTRA_ARTERIAL | Status: AC
  Filled 2017-11-16: qty 10

## 2017-11-16 MED ORDER — ORAL CARE MOUTH RINSE
15.0000 mL | Freq: Two times a day (BID) | OROMUCOSAL | Status: DC
  Administered 2017-11-16 – 2017-11-21 (×9): 15 mL via OROMUCOSAL

## 2017-11-16 MED ORDER — CARVEDILOL 12.5 MG PO TABS: 25.0000 mg | ORAL_TABLET | Freq: Once | ORAL | Status: DC

## 2017-11-16 MED ORDER — HYDROCHLOROTHIAZIDE 12.5 MG PO CAPS: 12.5000 mg | ORAL_CAPSULE | Freq: Every day | ORAL | Status: DC

## 2017-11-16 MED ORDER — SODIUM CHLORIDE 0.9 % IV SOLN
INTRAVENOUS | Status: DC
  Administered 2017-11-16 (×3): via INTRAVENOUS

## 2017-11-16 MED ORDER — LIDOCAINE HCL 1 % IJ SOLN
INTRAMUSCULAR | Status: AC
  Filled 2017-11-16: qty 20

## 2017-11-16 MED ORDER — PROPOFOL 10 MG/ML IV BOLUS
INTRAVENOUS | Status: DC | PRN
  Administered 2017-11-16: 130 mg via INTRAVENOUS

## 2017-11-16 MED ORDER — FENTANYL CITRATE (PF) 100 MCG/2ML IJ SOLN
INTRAMUSCULAR | Status: AC
  Filled 2017-11-16: qty 2

## 2017-11-16 MED ORDER — TICAGRELOR 90 MG PO TABS
ORAL_TABLET | ORAL | Status: AC
  Filled 2017-11-16: qty 2

## 2017-11-16 MED ORDER — SUCCINYLCHOLINE CHLORIDE 200 MG/10ML IV SOSY
PREFILLED_SYRINGE | INTRAVENOUS | Status: DC | PRN
  Administered 2017-11-16: 120 mg via INTRAVENOUS

## 2017-11-16 MED ORDER — TIROFIBAN HCL IN NACL 5-0.9 MG/100ML-% IV SOLN
INTRAVENOUS | Status: AC
  Filled 2017-11-16: qty 100

## 2017-11-16 MED ORDER — LIDOCAINE 2% (20 MG/ML) 5 ML SYRINGE
INTRAMUSCULAR | Status: DC | PRN
  Administered 2017-11-16: 40 mg via INTRAVENOUS

## 2017-11-16 MED ORDER — EPTIFIBATIDE 20 MG/10ML IV SOLN
INTRAVENOUS | Status: AC
  Filled 2017-11-16: qty 10

## 2017-11-16 MED ORDER — STROKE: EARLY STAGES OF RECOVERY BOOK
Freq: Once | Status: DC
  Filled 2017-11-16: qty 1

## 2017-11-16 MED ORDER — NICARDIPINE HCL IN NACL 20-0.86 MG/200ML-% IV SOLN
INTRAVENOUS | Status: DC | PRN
  Administered 2017-11-16: 5 mg/h via INTRAVENOUS

## 2017-11-16 MED ORDER — NICARDIPINE HCL IN NACL 20-0.86 MG/200ML-% IV SOLN
0.0000 mg/h | INTRAVENOUS | Status: DC
  Filled 2017-11-16: qty 200

## 2017-11-16 MED ORDER — IOHEXOL 300 MG/ML  SOLN
150.0000 mL | Freq: Once | INTRAMUSCULAR | Status: AC | PRN
  Administered 2017-11-16: 80 mL via INTRA_ARTERIAL

## 2017-11-16 MED ORDER — SODIUM CHLORIDE 0.9 % IV SOLN
INTRAVENOUS | Status: DC
  Administered 2017-11-18: 06:00:00 via INTRAVENOUS
  Administered 2017-11-19: 1000 mL via INTRAVENOUS
  Administered 2017-11-20: 22:00:00 via INTRAVENOUS

## 2017-11-16 MED ORDER — SUGAMMADEX SODIUM 200 MG/2ML IV SOLN
INTRAVENOUS | Status: DC | PRN
  Administered 2017-11-16: 200 mg via INTRAVENOUS

## 2017-11-16 NOTE — ED Notes (Signed)
Neurology at bedside. After further consultation with patient he states he was normal at bed time last night, 9:30pm.

## 2017-11-16 NOTE — Consult Note (Deleted)
Requesting Physician: Dr. Vanita Panda    Chief Complaint: Code stroke  History obtained from: Chart and patient  HPI:                                                                                                                                         Dakota [name] is an 84 y.o. 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   To the best of our history taking, patient's last seen normal was 2130 hrs on 11/15/17. He was brought to ER with LSN being documented at 1100 hrs 11/15/17 but he confirmed he went to bed normal around 2130 hrs. When found he had a right gaze preference and was weak on the left side.  Patient at this time is able to talk and answer questions along with follow commands.  He does have a drift in bilateral legs along with dysarthria and left visual field cut.  Patient was brought to CT scanner where showed a possible right M2 occlusion and was brought back for CTA and perfusion.  CTA does show possible M2/M3 occlusion. Patient unfortunately he is the primary caregiver of his wife who is bedridden and cannot speak or give information to [place].  Patient does chew tobacco He has Afib and decided not to opt for anticoagulation per his cardiologist note review.  Date last known well: Date: 11/15/2017 Time last known well: Time: 21:30 tPA Given: No: Out of window NIH stroke scale of 13 Modified Rankin: Rankin Score=1  Past Medical History:  Diagnosis Date  . Anxiety   . Back pain, chronic   . Complete heart block ([place]) 07/18/2014   Medtronic [place] model [id number] (serial number [id number])  singe lead PPM  . GERD (gastroesophageal reflux disease) 10/2002  . Hyperlipidemia 12/1994  . Hypertension   . Hypokalemia   . Lung collapse 03/1991   Fall  . Permanent atrial fibrillation ([name])    Refused coumadin therapy    Past Surgical History:  Procedure Laterality Date  . BACK SURGERY  11/91   with hardware  fixation  . CARDIAC CATHETERIZATION  06/2004   30 % stenosis, EF normal  . ESOPHAGOGASTRODUODENOSCOPY N/A 01/06/2016   Procedure: ESOPHAGOGASTRODUODENOSCOPY (EGD);  Surgeon: Otis Brace, MD;  Location: [place];  Service: Gastroenterology;  Laterality: N/A;  . EYE SURGERY     Retinal bubble surgery, cataract   . LEFT HEART CATHETERIZATION WITH CORONARY ANGIOGRAM N/A 07/18/2014   Procedure: LEFT HEART CATHETERIZATION WITH CORONARY ANGIOGRAM;  Surgeon: Jettie Booze, MD; [facility] [place], LAD mild dz, D1 80%, D2 OK, CFX system OK, RCA OK, PDA 100%, med rx  . PERMANENT PACEMAKER INSERTION N/A 07/18/2014   Procedure: PERMANENT PACEMAKER INSERTION;  Surgeon: Thompson Grayer, MD; Medtronic [place] model [phone] (serial number [id number])    . TEMPORARY PACEMAKER INSERTION N/A 07/18/2014   Procedure: TEMPORARY PACEMAKER INSERTION;  Surgeon: Ander Slade  [place], MD;  Location: [facility] CATH LAB;  Service: Cardiovascular;  Laterality: N/A;    Family History  Problem Relation Age of Onset  . Pneumonia Mother   . Hip fracture Mother   . Cancer Father        splenic  . Heart disease Sister        Pacer placed  . Cancer Brother        throat and lung  . Cancer Brother        prostate, age 74  . Prostate cancer Brother   . Heart disease Son   . Stroke Neg Hx   . Colon cancer Neg Hx          Social History:  reports that he quit smoking about 64 years ago. He has a 15.00 pack-year smoking history. His smokeless tobacco use includes chew. He reports that he does not drink alcohol or use drugs.  Allergies:  Allergies  Allergen Reactions  . Warfarin And Related Other (See Comments)    Lost a lot of weight and also experienced numbness  . Atorvastatin Other (See Comments)    REACTION: MUSCLE PAIN  . Ezetimibe Other (See Comments)    REACTION: MUSCLE PAIN  . Diazepam Other (See Comments)    UNKNOWN, per pt  . Penicillins Other (See Comments)    UNKNOWN, per pt    Medications:                                                                                                                            Current Facility-Administered Medications  Medication Dose Route Frequency Provider Last Rate Last Dose  . carvedilol (COREG) tablet 25 mg  25 mg Oral Once Carmin Muskrat, MD      . hydrochlorothiazide (MICROZIDE) capsule 12.5 mg  12.5 mg Oral Daily Carmin Muskrat, MD       Current Outpatient Medications  Medication Sig Dispense Refill  . ALPRAZolam (XANAX) 0.5 MG tablet TAKE ONE-HALF TABLET BY MOUTH TWICE DAILY AS NEEDED FOR ANXIETY (Patient taking differently: Take 0.25 mg by mouth 2 (two) times daily as needed for anxiety. TAKE ONE-HALF TABLET BY MOUTH TWICE DAILY AS NEEDED FOR ANXIETY) 60 tablet 0  . aspirin 81 MG tablet Take 81 mg by mouth 2 (two) times daily.     . calcium carbonate (TUMS - DOSED IN MG ELEMENTAL CALCIUM) 500 MG chewable tablet Chew 1 tablet by mouth daily as needed for indigestion.     . carvedilol (COREG) 25 MG tablet TAKE 1 TABLET BY MOUTH TWICE DAILY WITH  A  MEAL. 180 tablet 1  . docusate sodium (COLACE) 100 MG capsule Take 1 capsule (100 mg total) by mouth daily as needed for mild constipation.    . hydrochlorothiazide (MICROZIDE) 12.5 MG capsule TAKE ONE CAPSULE BY MOUTH ONCE DAILY 90 capsule 1  . Magnesium Hydroxide (MILK OF MAGNESIA PO) Take 1 tablet by mouth daily as needed.    . nitroGLYCERIN (NITROSTAT)  0.4 MG SL tablet Place 1 tablet (0.4 mg total) under the tongue every 5 (five) minutes as needed for chest pain. 25 tablet 3  . pantoprazole (PROTONIX) 40 MG tablet Take 1 tablet (40 mg total) by mouth daily. 30 tablet 0  . ranitidine (ZANTAC) 150 MG tablet TAKE 1 TABLET BY MOUTH ONCE DAILY 90 tablet 1     ROS:                                                                                                                                       History obtained from the patient  General ROS: negative for - chills, fatigue, fever, night sweats, weight gain or  weight loss Psychological ROS: negative for - , hallucinations, memory difficulties, mood swings or  Ophthalmic ROS: negative for - blurry vision, double vision, eye pain or loss of vision ENT ROS: negative for - epistaxis, nasal discharge, oral lesions, sore throat, tinnitus or vertigo Respiratory ROS: negative for - cough,  shortness of breath or wheezing Cardiovascular ROS: negative for - chest pain, dyspnea on exertion,  Gastrointestinal ROS: negative for - abdominal pain, diarrhea,  nausea/vomiting or stool incontinence Genito-Urinary ROS: negative for - dysuria, hematuria, incontinence or urinary frequency/urgency Musculoskeletal ROS: negative for - joint swelling or muscular weakness Neurological ROS: as noted in HPI  General Examination:                                                                                                      Blood pressure (!) 188/103, pulse 74, resp. rate 17, SpO2 96 %.  HEENT-  Normocephalic, no lesions, without obvious abnormality.  Normal external eye and conjunctiva.   Extremities- Warm, dry and intact Musculoskeletal-no joint tenderness, deformity or swelling Skin-warm and dry, no hyperpigmentation, vitiligo, or suspicious lesions  Neurological Examination Mental Status: Alert, oriented, thought content appropriate.  Speech dysarthric without evidence of aphasia.  Able to simple step commands without difficulty. Cranial Nerves: II: Appears to have a mild left visual field cut III,IV, VI: ptosis not present, extra-ocular motions shows a right gaze preference, able to cross midline however does not get to the left all the way., pupils equal, round, reactive to light and accommodation V,VII: Left facial droop, neglecting left side to DSS VIII: hearing normal bilaterally IX,X: uvula rises symmetrically XI: bilateral shoulder shrug XII: midline tongue extension Motor: She was able to raise both arms antigravity but is weaker on the left.  He is  able to lift both  legs but again weak on the left and shows bilateral drift. Sensory: To DSS he does show left-sided neglect Deep Tendon Reflexes: 2+ and symmetric throughout with brisk reflexes on the left Plantars: Right: downgoing   Left: downgoing Cerebellar: normal finger-to-nose,  and normal heel-to-shin test Gait: Not tested   Lab Results: Basic Metabolic Panel: Recent Labs  Lab 11/16/17 1134  NA 138  K 4.2  CL 104  CO2 23  GLUCOSE 124*  BUN 21  CREATININE 1.25*  CALCIUM 9.6    CBC: Recent Labs  Lab 11/16/17 1134  WBC 14.9*  NEUTROABS 11.6*  HGB 16.7  HCT 51.5  MCV 91.0  PLT 204    Lipid Panel: No results for input(s): CHOL, TRIG, HDL, CHOLHDL, VLDL, LDLCALC in the last 168 hours.  CBG: No results for input(s): GLUCAP in the last 168 hours.  Imaging: Ct Head Wo Contrast  Result Date: 11/16/2017 . IMPRESSION: 1. Acute to subacute infarct in the right basal ganglia, external capsule, and posterior operculum. Right distal MCA branch hyperdensity near the operculum likely reflects thrombus. These results were called by telephone at the time of interpretation on 11/16/2017 at 1:47 pm to Dr. Carmin Muskrat , who verbally acknowledged these results. Electronically Signed   By: Titus Dubin M.D.   On: 11/16/2017 13:49   Dg Chest Port 1 View  Result Date: 11/16/2017  IMPRESSION: No acute cardiopulmonary disease. Electronically Signed   By: Lucrezia Europe M.D.   On: 11/16/2017 11:49   Assessment and plan discussed with with attending physician and they are in agreement.    Etta Quill PA-C [facility] [phone]  11/16/2017, 3:05 PM   Attending Neurohospitalist Addendum Patient seen and examined with APP/Resident. Agree with the history and physical as documented above. I have independently reviewed the chart, obtained history, review of systems and examined the patient.I have personally reviewed pertinent head/neck/spine imaging (CT/MRI). CTH  with Rt M3 hyperdensity. CTA with possible Rt M2-3 occlusion. CTP with zero core and 41 cc penumbra.   Assessment: 84 y.o. male past medical history of complete heart block, status post pacemaker, hypertension, hyperlipidemia, atrial fibrillation not on anticoagulation presented to the emergency room for evaluation of generalized weakness. On examination he had lateralized left-sided weakness with CT scan suggestive of a possible left M2 occlusion. CT angiogram showed a left distal M2/proximal M3 occlusion. He was not in the window for IV TPA but was in the window for endovascular thrombectomy. CT perfusion study was done that showed a favorable profile and patient was taken for endovascular thrombectomy.  Stroke Risk Factors - hyperlipidemia and hypertension  Acute Ischemic Stroke Cerebral infarction due to embolism of right middle cerebral artery Acuity: Acute Current Suspected Etiology: cardioembolic Continue Evaluation:  -Admit to: NICU -Continue Aspirin if OK with endovascular -Continue Statin -Blood pressure control, goal of SYS 120-160 -MRI/ECHO/A1C/Lipid panel. -Hyperglycemia management per SSI to maintain glucose 140-180mg /dL. -PT/OT/ST therapies and recommendations when able  CNS -Close neuro monitoring  Dysarthria Dysphagia following cerebral infarction  -NPO until cleared by speech -ST -Advance diet as tolerated -May need PEG  Hemiplegia and hemiparesis following cerebral infarction affecting left non-dominant side  -PT/OT  RESP Elective intubation for mechanical thrombectomy -vent management per ICU -wean when able  CV Essential (primary) hypertension -Aggressive BP control, goal SBP as above -Cleviprex and labetalol PRN   -TTE -Continue BB -Cards Consult  Hyperlipidemia, unspecified  - Statin for goal LDL < 70  Chronic atrial  fibrillation -Rate control -not on [facility]  by choice. Need to revisit  HEME -transfuse for hgb < 7  ENDO -goal HgbA1c <  7  GI/GU -Gentle hydration  Fluid/Electrolyte Disorders -Replete -Repeat labs  ID Possible Aspiration PNA -CXR -NPO -Monitor  Prophylaxis DVT:   Hold sqh until  OK by IR  GI: NA Bowel: doc senaa   Diet: NPO until cleared by speech  Code Status: Full Code   [place] -Neurology called for a routine consultation and not paged as a code stroke due to initial LKN told by family to be 1100 on 11/15/17 whereas neurology interview yielded LKN of 2130 hrs 11/15/17. -No family at bedside and patient with severe dysarthria. Consent had to be obtained from son Payne Garske over phone.  Please feel free to call with any questions. --- Amie Portland, MD [facility] Pager: [phone]  If 7pm to 7am, please call on call as listed on AMION.   CRITICAL CARE ATTESTATION This patient is critically ill and at significant risk of neurological worsening, death and care requires constant monitoring of vital signs, hemodynamics,respiratory and cardiac monitoring. I spent 70  minutes of neurocritical care time performing neurological assessment, discussion with family, other specialists and medical decision making of high complexityin the care of  this patient.

## 2017-11-16 NOTE — Transfer of Care (Signed)
Immediate Anesthesia Transfer of Care Note  Patient: Dakota [name]  Procedure(s) Performed: RADIOLOGY WITH ANESTHESIA (N/A )  Patient Location: PACU  Anesthesia Type:General  Level of Consciousness: awake  Airway & Oxygen Therapy: Patient Spontanous Breathing and Patient connected to face mask oxygen  Post-op Assessment: Report given to RN and Post -op Vital signs reviewed and stable  Post vital signs: Reviewed and stable  Last Vitals:  Vitals Value Taken Time  BP 142/71 11/16/2017  5:55 PM  Temp 36.3 C 11/16/2017  5:55 PM  Pulse 77 11/16/2017  5:57 PM  Resp 15 11/16/2017  5:57 PM  SpO2 98 % 11/16/2017  5:57 PM  Vitals shown include unvalidated device data.  Last Pain: There were no vitals filed for this visit.       Complications: No apparent anesthesia complications

## 2017-11-16 NOTE — ED Triage Notes (Signed)
[facility]_ pt coming from home with AMS Pt was last seem normal yesterday at 1100. Pt has right side gaze preference. Pt was hypertensive with EMS 200/100. HR 60, 18g LAC.

## 2017-11-16 NOTE — Progress Notes (Signed)
Patient ID: Dakota [name], male   DOB: [birth year], 84 y.o.   MRN: [id number] INR Pt extubated. Maintaining airway and O2 sats. Pupils 2.52mm Rt = LT  Withdraws his Lt leg to stimulation. S.Minas Bonser MD

## 2017-11-16 NOTE — Anesthesia Procedure Notes (Signed)
Arterial Line Insertion Start/End7/18/2019 3:20 PM, 11/16/2017 3:33 PM Performed by: Lavell Luster, CRNA, CRNA  Preanesthetic checklist: patient identified, IV checked, site marked, risks and benefits discussed, surgical consent, monitors and equipment checked and pre-op evaluation Left, radial was placed Catheter size: 20 G Hand hygiene performed , maximum sterile barriers used  and Seldinger technique used Allen's test indicative of satisfactory collateral circulation Attempts: 1 Procedure performed without using ultrasound guided technique. Following insertion, dressing applied and Biopatch. Post procedure assessment: normal  Patient tolerated the procedure well with no immediate complications.

## 2017-11-16 NOTE — Progress Notes (Signed)
Patient ID: Dakota [name], male   DOB: 10-Jul-1928, 84 y.o.   MRN: [id number] INR. 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 Discovered in there ER to be weak on his Lt side. MRS of 0. CT Brain no ICH. ? Acute to subacute RT BG iischemia. But rapid revealing only a Tmax > 6.0 s of 41 ml without CBF < 30 % vol of 0 ml.  NIH of 12 . Option of potential  Endovascular revascularization of Rt MCA br occlusion to prevent further neurological injury discussed with patients son. Procedure ,risks ,alternatives reviewed. Risks of ICH of 10 % with further neurological decline,vent dependency death,inability to revasculaise ,vascular injury all reviewed. Questions answered to sons satisfaction. Informed witnessed consent  obtained over the phone from patients son for endovascular treatment. S.Delray Reza MD

## 2017-11-16 NOTE — Progress Notes (Signed)
Manual pressure held by [employer] until 1718

## 2017-11-16 NOTE — Anesthesia Postprocedure Evaluation (Signed)
Anesthesia Post Note  Patient: Dakota [name]  Procedure(s) Performed: RADIOLOGY WITH ANESTHESIA (N/A )     Patient location during evaluation: PACU Anesthesia Type: General Level of consciousness: awake and alert Pain management: pain level controlled Vital Signs Assessment: post-procedure vital signs reviewed and stable Respiratory status: spontaneous breathing, nonlabored ventilation, respiratory function stable and patient connected to nasal cannula oxygen Cardiovascular status: blood pressure returned to baseline and stable Postop Assessment: no apparent nausea or vomiting Anesthetic complications: no    Last Vitals:  Vitals:   11/16/17 1500 11/16/17 1755  BP: (!) 194/125 (!) 142/71  Pulse: 93 84  Resp: 20 17  Temp:  (!) 36.3 C  SpO2: 96% 99%    Last Pain: There were no vitals filed for this visit.               [place]

## 2017-11-16 NOTE — ED Notes (Signed)
MD at bedside. 

## 2017-11-16 NOTE — ED Notes (Signed)
Pt transported to CT with RN

## 2017-11-16 NOTE — Anesthesia Preprocedure Evaluation (Addendum)
Anesthesia Evaluation  Patient identified by MRN, date of birth, ID band Patient awake    Reviewed: Allergy & Precautions, NPO status , Patient's Chart, lab work & pertinent test results  Airway Mallampati: I  TM Distance: >3 FB Neck ROM: Full    Dental   Pulmonary former smoker,    Pulmonary exam normal  + decreased breath sounds      Cardiovascular hypertension, Pt. on medications + CAD and + Past MI  Normal cardiovascular exam+ dysrhythmias Atrial Fibrillation  Rhythm:Irregular Rate:Normal     Neuro/Psych Anxiety CVA    GI/Hepatic GERD  Medicated and Controlled,  Endo/Other    Renal/GU      Musculoskeletal   Abdominal   Peds  Hematology   Anesthesia Other Findings   Reproductive/Obstetrics                            Anesthesia Physical Anesthesia Plan  ASA: IV  Anesthesia Plan: General   Post-op Pain Management:    Induction: Intravenous  PONV Risk Score and Plan: 2 and Treatment may vary due to age or medical condition  Airway Management Planned: Oral ETT  Additional Equipment: Arterial line  Intra-op Plan:   Post-operative Plan: Post-operative intubation/ventilation  Informed Consent: I have reviewed the patients History and Physical, chart, labs and discussed the procedure including the risks, benefits and alternatives for the proposed anesthesia with the patient or authorized representative who has indicated his/her understanding and acceptance.     Plan Discussed with: CRNA and Surgeon  Anesthesia Plan Comments:         Anesthesia Quick Evaluation

## 2017-11-16 NOTE — Anesthesia Procedure Notes (Signed)
Procedure Name: Intubation Date/Time: 11/16/2017 3:34 PM Performed by: Lavell Luster, CRNA Pre-anesthesia Checklist: Patient identified, Emergency Drugs available, Suction available, Patient being monitored and Timeout performed Patient Re-evaluated:Patient Re-evaluated prior to induction Oxygen Delivery Method: Circle system utilized Preoxygenation: Pre-oxygenation with 100% oxygen Induction Type: IV induction Ventilation: Mask ventilation without difficulty Laryngoscope Size: Mac and 3 Grade View: Grade I Tube type: Oral Tube size: 7.5 mm Number of attempts: 1 Airway Equipment and Method: Stylet Placement Confirmation: ETT inserted through vocal cords under direct vision,  positive ETCO2 and breath sounds checked- equal and bilateral Secured at: 22 cm Tube secured with: Tape Dental Injury: Teeth and Oropharynx as per pre-operative assessment

## 2017-11-16 NOTE — Procedures (Signed)
S/P RT common carotid arteriogram followed by endovascular revascularization of occluded RT MCA inf division distal M2 seg  With x 1 pass with  With 63mm x 20 mm trevoprovue retriever device achieving a TICI 2bplus revascularization.

## 2017-11-16 NOTE — ED Provider Notes (Signed)
[facility] EMERGENCY DEPARTMENT Provider Note   CSN: [id number] Arrival date & time: 11/16/17  1105     History   Chief Complaint Chief Complaint  Patient presents with  . Altered Mental Status    HPI Dakota [name] is a 84 y.o. male.  HPI  Patient presents via EMS due to weakness, altered mental status. Story initially unclear given the patient's slow, difficult to comprehend speech, slow interactivity, level 5 caveat. Story eventually became more clear as the patient's son arrived, provided additional details, as below.  Level 5 caveat secondary to acuity of condition on arrival. EMS reports that the patient was last seen normal yesterday, found on the ground today, just prior to EMS being notified. Unclear circumstances, though they note that the patient's elderly wife lives with him at home. EMS reports that in route the patient was globally weak, interactive, there was a slow, difficult to comprehend speech. Last seen normal time approximately 12 hours ago.   Past Medical History:  Diagnosis Date  . Anxiety   . Back pain, chronic   . Complete heart block ([place]) 07/18/2014   [employer] [place] model [id number] (serial number [id number])  singe lead PPM  . GERD (gastroesophageal reflux disease) 10/2002  . Hyperlipidemia 12/1994  . Hypertension   . Hypokalemia   . Lung collapse 03/1991   Fall  . Permanent atrial fibrillation ([place])    Refused coumadin therapy    Patient Active Problem List   Diagnosis Date Noted  . Caregiver stress 10/05/2015  . Hematuria 04/06/2015  . ARF (acute renal failure) ([place]) 07/18/2014  . Cardiogenic shock ([place]) 07/18/2014  . Atrial fibrillation with slow ventricular response ([place])   . NSTEMI (non-ST elevated myocardial infarction) ([place])   . CHB (complete heart block) - s/p MDT 1 lead PPM 07/17/2014  . Advance care planning 07/03/2014  . Sebaceous cyst 04/11/2013  . Medicare annual wellness visit, subsequent 10/28/2012    . RIB PAIN, RIGHT SIDED 01/02/2009  . CAD, NATIVE VESSEL 11/25/2008  . Unspecified vitamin D deficiency 11/24/2008  . RECENT RET DETACH PARTIAL W/SINGLE DEFECT 11/24/2008  . ESSENTIAL HYPERTENSION, BENIGN 07/21/2008  . HLD (hyperlipidemia) 05/22/2007  . Atrial fibrillation ([place]) 05/22/2007  . GERD 05/22/2007  . GASTRIC ULCER, ACUTE, HEMORRHAGE 05/22/2007  . Hyperglycemia 05/22/2007    Past Surgical History:  Procedure Laterality Date  . BACK SURGERY  11/91   with hardware fixation  . CARDIAC CATHETERIZATION  06/2004   30 % stenosis, EF normal  . ESOPHAGOGASTRODUODENOSCOPY N/A 01/06/2016   Procedure: ESOPHAGOGASTRODUODENOSCOPY (EGD);  Surgeon: Otis Brace, MD;  Location: [facility];  Service: Gastroenterology;  Laterality: N/A;  . EYE SURGERY     Retinal bubble surgery, cataract   . LEFT HEART CATHETERIZATION WITH CORONARY ANGIOGRAM N/A 07/18/2014   Procedure: LEFT HEART CATHETERIZATION WITH CORONARY ANGIOGRAM;  Surgeon: Jettie Booze, MD; [facility] [place], LAD mild dz, D1 80%, D2 OK, CFX system OK, RCA OK, PDA 100%, med rx  . PERMANENT PACEMAKER INSERTION N/A 07/18/2014   Procedure: PERMANENT PACEMAKER INSERTION;  Surgeon: Thompson Grayer, MD; [employer]onic [place] model [phone] (serial number [id number])    . TEMPORARY PACEMAKER INSERTION N/A 07/18/2014   Procedure: TEMPORARY PACEMAKER INSERTION;  Surgeon: Peter M Martinique, MD;  Location[facility] C[facility] LAB;  Service: Cardiovascular;  Laterality: N/A;        Home Medications    Prior to Admission medications   Medication Sig Start Date End Date Taking? Authorizing Provider  ALPRAZolam Duanne Moron) 0.5 MG tablet  TAKE ONE-HALF TABLET BY MOUTH TWICE DAILY AS NEEDED FOR ANXIETY 10/14/17   Tonia Ghent, MD  aspirin 81 MG tablet Take 81 mg by mouth 2 (two) times daily.     [provider]  calcium carbonate (TUMS - DOSED IN MG ELEMENTAL CALCIUM) 500 MG chewable tablet Chew 1 tablet by mouth daily as needed for indigestion.     [provider]  carvedilol (COREG) 25 MG tablet TAKE 1 TABLET BY MOUTH TWICE DAILY WITH  A  MEAL. 08/03/17   Josue Hector, MD  docusate sodium (COLACE) 100 MG capsule Take 1 capsule (100 mg total) by mouth daily as needed for mild constipation. 01/23/17   Tonia Ghent, MD  hydrochlorothiazide (MICROZIDE) 12.5 MG capsule TAKE ONE CAPSULE BY MOUTH ONCE DAILY 07/27/17   Josue Hector, MD  MAGNESIUM HYDROXIDE PO Take 1 tablet by mouth as directed.    [provider]  nitroGLYCERIN (NITROSTAT) 0.4 MG SL tablet Place 1 tablet (0.4 mg total) under the tongue every 5 (five) minutes as needed for chest pain. 01/07/15   Josue Hector, MD  pantoprazole (PROTONIX) 40 MG tablet Take 1 tablet (40 mg total) by mouth daily. 01/06/16   Domenic Moras, PA-C  ranitidine (ZANTAC) 150 MG tablet TAKE 1 TABLET BY MOUTH ONCE DAILY 07/27/17   Josue Hector, MD    Family History Family History  Problem Relation Age of Onset  . Pneumonia Mother   . Hip fracture Mother   . Cancer Father        splenic  . Heart disease Sister        Pacer placed  . Cancer Brother        throat and lung  . Cancer Brother        prostate, age 11  . Prostate cancer Brother   . Heart disease Son   . Stroke Neg Hx   . Colon cancer Neg Hx     Social History Social History   Tobacco Use  . Smoking status: Former Smoker    Packs/day: 1.00    Years: 15.00    Pack years: 15.00    Last attempt to quit: 05/02/1953    Years since quitting: 64.5  . Smokeless tobacco: Current User    Types: Chew  . Tobacco comment: Quit smoking over 50 years ago  Substance Use Topics  . Alcohol use: No  . Drug use: No     Allergies   Warfarin and related; Atorvastatin; Ezetimibe; Diazepam; and Penicillins   Review of Systems Review of Systems  Unable to perform ROS: Mental status change     Physical Exam Updated Vital Signs There were no vitals taken for this visit.  Physical Exam  Constitutional: He appears listless. He  has a sickly appearance. No distress.  HENT:  Head: Normocephalic and atraumatic.  Eyes: Conjunctivae and EOM are normal.  Cardiovascular: Normal rate and regular rhythm.  Pulmonary/Chest: Effort normal. No stridor. No respiratory distress.  Abdominal: He exhibits no distension.  Musculoskeletal: He exhibits no edema.  Neurological: He appears listless.  Diffuse mild atrophy, patient moves all extremities spontaneously, has diminished strength throughout, but possibly slightly less on the left upper extremity than right upper extremity, proximal, distal. Patient has partial left hemineglect, moving his head to the left to follow commands, but not crossing midline with his eyes.  Skin: Skin is warm and dry.  Psychiatric: He is slowed and withdrawn. Cognition and memory are impaired.  Nursing note and vitals reviewed.    ED Treatments / Results  Labs (all labs ordered are listed, but only abnormal results are displayed) Labs Reviewed  CBC WITH DIFFERENTIAL/PLATELET - Abnormal; Notable for the following components:      Result Value   WBC 14.9 (*)    Neutro Abs 11.6 (*)    Monocytes Absolute 1.3 (*)    All other components within normal limits  URINALYSIS, ROUTINE W REFLEX MICROSCOPIC - Abnormal; Notable for the following components:   Glucose, UA 50 (*)    Hgb urine dipstick MODERATE (*)    Protein, ur 100 (*)    Bacteria, UA FEW (*)    All other components within normal limits  COMPREHENSIVE METABOLIC PANEL - Abnormal; Notable for the following components:   Glucose, Bld 124 (*)    Creatinine, Ser 1.25 (*)    Total Bilirubin 1.8 (*)    GFR calc non Af Amer 50 (*)    GFR calc Af Amer 58 (*)    All other components within normal limits  HEMOGLOBIN A1C - Abnormal; Notable for the following components:   Hgb A1c MFr Bld 6.1 (*)    All other components within normal limits  LIPID PANEL - Abnormal; Notable for the following components:   HDL 37 (*)    LDL Cholesterol 102 (*)     All other components within normal limits  CBC WITH DIFFERENTIAL/PLATELET - Abnormal; Notable for the following components:   WBC 15.1 (*)    Neutro Abs 12.8 (*)    All other components within normal limits  BASIC METABOLIC PANEL - Abnormal; Notable for the following components:   Glucose, Bld 184 (*)    BUN 24 (*)    Creatinine, Ser 1.26 (*)    Calcium 8.5 (*)    GFR calc non Af Amer 50 (*)    GFR calc Af Amer 58 (*)    All other components within normal limits  MRSA PCR SCREENING  LIPASE, BLOOD  TROPONIN I  OCCULT BLOOD X 1 CARD TO LAB, STOOL  I-STAT CG4 LACTIC ACID, ED  POC OCCULT BLOOD, ED  I-STAT CG4 LACTIC ACID, ED    Radiology Initial CT scan notable for acute versus subacute distal MCA branch lesion.  Subsequently the patient had CT angiography, CT perfusion studies, both discussed with our neurology colleagues.  Procedures Procedures (including critical care time)  Medications Ordered in ED Medications   stroke: mapping our early stages of recovery book (has no administration in time range)  senna-docusate (Senokot-S) tablet 1 tablet (has no administration in time range)  nitroGLYCERIN 100 MCG/ML intra-arterial injection (has no administration in time range)  nitroGLYCERIN 100 MCG/ML intra-arterial injection (has no administration in time range)  vancomycin (VANCOCIN) 1-5 GM/200ML-% IVPB (has no administration in time range)  acetaminophen (TYLENOL) tablet 650 mg (has no administration in time range)    Or  acetaminophen (TYLENOL) solution 650 mg (has no administration in time range)    Or  acetaminophen (TYLENOL) suppository 650 mg (has no administration in time range)  0.9 %  sodium chloride infusion ( Intravenous Stopped 11/16/17 2349)  nicardipine (CARDENE) 5m in 0.83% saline 2036mIV DOUBLE STRENGTH infusion (0.2 mg/ml) (4 mg/hr Intravenous New Bag/Given 11/17/17 0631)  MEDLINE mouth rinse (15 mLs Mouth Rinse Given 11/16/17 2117)  carvedilol (COREG) tablet  12.5 mg (has no administration in time range)  aspirin EC tablet 325 mg (has no administration in time range)    Or  aspirin suppository 300 mg (has no administration in time range)  sodium chloride 0.9 % bolus 1,000 mL (0 mLs Intravenous Stopped 11/16/17 1505)  iopamidol (ISOVUE-370) 76 % injection (95 mLs  Contrast Given 11/16/17 1430)  fentaNYL (SUBLIMAZE) 100 MCG/2ML injection (  Override pull for Anesthesia 11/16/17 1549)  iopamidol (ISOVUE-300) 61 % injection (20 mLs  Contrast Given 11/16/17 1659)  nitroGLYCERIN 1 mg/10 ml (100 mcg/ml) - IR/CATH LAB (25 mcg Intra-arterial Given 11/16/17 1636)  iohexol (OMNIPAQUE) 300 MG/ML solution 150 mL (80 mLs Intra-arterial Contrast Given 11/16/17 1657)     Initial Impression / Assessment and Plan / ED Course  I have reviewed the triage vital signs and the nursing notes.  Pertinent labs & imaging results that were available during my care of the patient were reviewed by me and considered in my medical decision making (see chart for details).    With concern for sepsis versus stroke versus metabolic phenomena, patient had broad labs, CT scan performed after arrival.  Patient received fluid resuscitation given concern for dehydration, prolonged downtime status as well. Initial studies notable for acute versus subacute stroke, and the patient's case was discussed with our neurology colleagues. While the patient received fluid resuscitation, had some clinical improvement, patient was sent for CT angiography, perfusion study. Soon thereafter, the patient's son arrived, we discussed his father's presentation, history. Son notes that the patient typically is awake and alert, ambulatory, capable of mowing the lawn, other physical activity, and cares for his elderly wife who is wheelchair-bound. With CT, CT perfusion, CT angiography concerning for infarct, though the patient is not a candidate for TPA given the time since last being seen normal, I discussed this  case with neurologist, and he with our interventional radiologist, and the patient was taken for interventional radiology clot retrieval. Initial labs in the ED generally reassuring without substantial findings beyond mild renal dysfunction, mild leukocytosis.  Chart review next day notable for IR procedure as below: Signed         S/P RT common carotid arteriogram followed by endovascular revascularization of occluded RT MCA inf division distal M2 seg  With x 1 pass with  With 62m x 20 mm trevoprovue retriever device achieving a TICI 2bplus revascularization.        This elderly male presents after being found on the ground, last seen normal approximately 12 hours ago, now with weakness, some left-sided hemineglect. Given concern for acute versus subacute infarct, patient had interventional radiology after initial studies were performed in the emergency department, case discussed with neurology Patient went from the emergency department emergently to the interventional radiology suite, was admitted to the ICU for further evaluation and management.  Final Clinical Impressions(s) / ED Diagnoses  Weakness Acute stroke   CRITICAL CARE Performed by: RCarmin MuskratTotal critical care time: 40 minutes Critical care time was exclusive of separately billable procedures and treating other patients. Critical care was necessary to treat or prevent imminent or life-threatening deterioration. Critical care was time spent personally by me on the following activities: development of treatment plan with patient and/or surrogate as well as nursing, discussions with consultants, evaluation of patient's response to treatment, examination of patient, obtaining history from patient or surrogate, ordering and performing treatments and interventions, ordering and review of laboratory studies, ordering and review of radiographic studies, pulse oximetry and re-evaluation of patient's condition.    LCarmin Muskrat MD 11/17/17 [phone]

## 2017-11-16 NOTE — H&P (Addendum)
Requesting Physician: Dr. Vanita Panda    Chief Complaint: Code stroke  History obtained from: Chart and patient  HPI:                                                                                                                                         Dakota [name] is an 84 y.o. 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   To the best of our history taking, patient's last seen normal was 2130 hrs on 11/15/17. He was brought to ER with LSN being documented at 1100 hrs 11/15/17 but he confirmed he went to bed normal around 2130 hrs. When found he had a right gaze preference and was weak on the left side.  Patient at this time is able to talk and answer questions along with follow commands.  He does have a drift in bilateral legs along with dysarthria and left visual field cut.  Patient was brought to CT scanner where showed a possible right M2 occlusion and was brought back for CTA and perfusion.  CTA does show possible M2/M3 occlusion. Patient unfortunately he is the primary caregiver of his wife who is bedridden and cannot speak or give information to [place].  Patient does chew tobacco He has Afib and decided not to opt for anticoagulation per his cardiologist note review.  Date last known well: Date: 11/15/2017 Time last known well: Time: 21:30 tPA Given: No: Out of window NIH stroke scale of 13 Modified Rankin: Rankin Score=1  Past Medical History:  Diagnosis Date  . Anxiety   . Back pain, chronic   . Complete heart block ([place]) 07/18/2014   Medtronic [place] model [id number] (serial number [id number])  singe lead PPM  . GERD (gastroesophageal reflux disease) 10/2002  . Hyperlipidemia 12/1994  . Hypertension   . Hypokalemia   . Lung collapse 03/1991   Fall  . Permanent atrial fibrillation ([name])    Refused coumadin therapy    Past Surgical History:  Procedure Laterality Date  . BACK SURGERY  11/91   with hardware  fixation  . CARDIAC CATHETERIZATION  06/2004   30 % stenosis, EF normal  . ESOPHAGOGASTRODUODENOSCOPY N/A 01/06/2016   Procedure: ESOPHAGOGASTRODUODENOSCOPY (EGD);  Surgeon: Otis Brace, MD;  Location: [place] [facility];  Service: Gastroenterology;  Laterality: N/A;  . EYE SURGERY     Retinal bubble surgery, cataract   . LEFT HEART CATHETERIZATION WITH CORONARY ANGIOGRAM N/A 07/18/2014   Procedure: LEFT HEART CATHETERIZATION WITH CORONARY ANGIOGRAM;  Surgeon: Jettie Booze, MD; [facility] [place], LAD mild dz, D1 80%, D2 OK, CFX system OK, RCA OK, PDA 100%, med rx  . PERMANENT PACEMAKER INSERTION N/A 07/18/2014   Procedure: PERMANENT PACEMAKER INSERTION;  Surgeon: Thompson Grayer, MD; [employer]tronic [place] model [id number] (serial number [id number])    . TEMPORARY PACEMAKER INSERTION N/A 07/18/2014   Procedure: TEMPORARY PACEMAKER INSERTION;  Surgeon: Ander Slade  [place], MD;  Location: [facility] CATH LAB;  Service: Cardiovascular;  Laterality: N/A;    Family History  Problem Relation Age of Onset  . Pneumonia Mother   . Hip fracture Mother   . Cancer Father        splenic  . Heart disease Sister        Pacer placed  . Cancer Brother        throat and lung  . Cancer Brother        prostate, age 56  . Prostate cancer Brother   . Heart disease Son   . Stroke Neg Hx   . Colon cancer Neg Hx          Social History:  reports that he quit smoking about 64 years ago. He has a 15.00 pack-year smoking history. His smokeless tobacco use includes chew. He reports that he does not drink alcohol or use drugs.  Allergies:  Allergies  Allergen Reactions  . Warfarin And Related Other (See Comments)    Lost a lot of weight and also experienced numbness  . Atorvastatin Other (See Comments)    REACTION: MUSCLE PAIN  . Ezetimibe Other (See Comments)    REACTION: MUSCLE PAIN  . Diazepam Other (See Comments)    UNKNOWN, per pt  . Penicillins Other (See Comments)    UNKNOWN, per pt    Medications:                                                                                                                            Current Facility-Administered Medications  Medication Dose Route Frequency Provider Last Rate Last Dose  . carvedilol (COREG) tablet 25 mg  25 mg Oral Once Carmin Muskrat, MD      . hydrochlorothiazide (MICROZIDE) capsule 12.5 mg  12.5 mg Oral Daily Carmin Muskrat, MD       Current Outpatient Medications  Medication Sig Dispense Refill  . ALPRAZolam (XANAX) 0.5 MG tablet TAKE ONE-HALF TABLET BY MOUTH TWICE DAILY AS NEEDED FOR ANXIETY (Patient taking differently: Take 0.25 mg by mouth 2 (two) times daily as needed for anxiety. TAKE ONE-HALF TABLET BY MOUTH TWICE DAILY AS NEEDED FOR ANXIETY) 60 tablet 0  . aspirin 81 MG tablet Take 81 mg by mouth 2 (two) times daily.     . calcium carbonate (TUMS - DOSED IN MG ELEMENTAL CALCIUM) 500 MG chewable tablet Chew 1 tablet by mouth daily as needed for indigestion.     . carvedilol (COREG) 25 MG tablet TAKE 1 TABLET BY MOUTH TWICE DAILY WITH  A  MEAL. 180 tablet 1  . docusate sodium (COLACE) 100 MG capsule Take 1 capsule (100 mg total) by mouth daily as needed for mild constipation.    . hydrochlorothiazide (MICROZIDE) 12.5 MG capsule TAKE ONE CAPSULE BY MOUTH ONCE DAILY 90 capsule 1  . Magnesium Hydroxide (MILK OF MAGNESIA PO) Take 1 tablet by mouth daily as needed.    . nitroGLYCERIN (NITROSTAT)  0.4 MG SL tablet Place 1 tablet (0.4 mg total) under the tongue every 5 (five) minutes as needed for chest pain. 25 tablet 3  . pantoprazole (PROTONIX) 40 MG tablet Take 1 tablet (40 mg total) by mouth daily. 30 tablet 0  . ranitidine (ZANTAC) 150 MG tablet TAKE 1 TABLET BY MOUTH ONCE DAILY 90 tablet 1     ROS:                                                                                                                                       History obtained from the patient  General ROS: negative for - chills, fatigue, fever, night sweats, weight gain or  weight loss Psychological ROS: negative for - , hallucinations, memory difficulties, mood swings or  Ophthalmic ROS: negative for - blurry vision, double vision, eye pain or loss of vision ENT ROS: negative for - epistaxis, nasal discharge, oral lesions, sore throat, tinnitus or vertigo Respiratory ROS: negative for - cough,  shortness of breath or wheezing Cardiovascular ROS: negative for - chest pain, dyspnea on exertion,  Gastrointestinal ROS: negative for - abdominal pain, diarrhea,  nausea/vomiting or stool incontinence Genito-Urinary ROS: negative for - dysuria, hematuria, incontinence or urinary frequency/urgency Musculoskeletal ROS: negative for - joint swelling or muscular weakness Neurological ROS: as noted in HPI  General Examination:                                                                                                      Blood pressure (!) 188/103, pulse 74, resp. rate 17, SpO2 96 %.  HEENT-  Normocephalic, no lesions, without obvious abnormality.  Normal external eye and conjunctiva.   Extremities- Warm, dry and intact Musculoskeletal-no joint tenderness, deformity or swelling Skin-warm and dry, no hyperpigmentation, vitiligo, or suspicious lesions  Neurological Examination Mental Status: Alert, oriented, thought content appropriate.  Speech dysarthric without evidence of aphasia.  Able to simple step commands without difficulty. Cranial Nerves: II: Appears to have a mild left visual field cut III,IV, VI: ptosis not present, extra-ocular motions shows a right gaze preference, able to cross midline however does not get to the left all the way., pupils equal, round, reactive to light and accommodation V,VII: Left facial droop, neglecting left side to DSS VIII: hearing normal bilaterally IX,X: uvula rises symmetrically XI: bilateral shoulder shrug XII: midline tongue extension Motor: She was able to raise both arms antigravity but is weaker on the left.  He is  able to lift both  legs but again weak on the left and shows bilateral drift. Sensory: To DSS he does show left-sided neglect Deep Tendon Reflexes: 2+ and symmetric throughout with brisk reflexes on the left Plantars: Right: downgoing   Left: downgoing Cerebellar: normal finger-to-nose,  and normal heel-to-shin test Gait: Not tested   Lab Results: Basic Metabolic Panel: Recent Labs  Lab 11/16/17 1134  NA 138  K 4.2  CL 104  CO2 23  GLUCOSE 124*  BUN 21  CREATININE 1.25*  CALCIUM 9.6    CBC: Recent Labs  Lab 11/16/17 1134  WBC 14.9*  NEUTROABS 11.6*  HGB 16.7  HCT 51.5  MCV 91.0  PLT 204    Lipid Panel: No results for input(s): CHOL, TRIG, HDL, CHOLHDL, VLDL, LDLCALC in the last 168 hours.  CBG: No results for input(s): GLUCAP in the last 168 hours.  Imaging: Ct Head Wo Contrast  Result Date: 11/16/2017 . IMPRESSION: 1. Acute to subacute infarct in the right basal ganglia, external capsule, and posterior operculum. Right distal MCA branch hyperdensity near the operculum likely reflects thrombus. These results were called by telephone at the time of interpretation on 11/16/2017 at 1:47 pm to Dr. Carmin Muskrat , who verbally acknowledged these results. Electronically Signed   By: Titus Dubin M.D.   On: 11/16/2017 13:49   Dg Chest Port 1 View  Result Date: 11/16/2017  IMPRESSION: No acute cardiopulmonary disease. Electronically Signed   By: Lucrezia Europe M.D.   On: 11/16/2017 11:49   Assessment and plan discussed with with attending physician and they are in agreement.    Etta Quill PA-C [facility] [phone]  11/16/2017, 3:05 PM   Attending Neurohospitalist Addendum Patient seen and examined with APP/Resident. Agree with the history and physical as documented above. I have independently reviewed the chart, obtained history, review of systems and examined the patient.I have personally reviewed pertinent head/neck/spine imaging (CT/MRI). CTH  with Rt M3 hyperdensity. CTA with possible Rt M2-3 occlusion. CTP with zero core and 41 cc penumbra.   Assessment: 84 y.o. male past medical history of complete heart block, status post pacemaker, hypertension, hyperlipidemia, atrial fibrillation not on anticoagulation presented to the emergency room for evaluation of generalized weakness. On examination he had lateralized left-sided weakness with CT scan suggestive of a possible left M2 occlusion. CT angiogram showed a left distal M2/proximal M3 occlusion. He was not in the window for IV TPA but was in the window for endovascular thrombectomy. CT perfusion study was done that showed a favorable profile and patient was taken for endovascular thrombectomy.  Stroke Risk Factors - hyperlipidemia and hypertension  Acute Ischemic Stroke Cerebral infarction due to embolism of right middle cerebral artery Acuity: Acute Current Suspected Etiology: cardioembolic Continue Evaluation:  -Admit to: NICU -Continue Aspirin if OK with endovascular -Continue Statin -Blood pressure control, goal of SYS 120-160 -MRI/ECHO/A1C/Lipid panel. -Hyperglycemia management per SSI to maintain glucose 140-180mg /dL. 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 Need to revisit  HEME -transfuse for hgb < 7  ENDO -goal HgbA1c <  7  GI/GU -Gentle hydration  Fluid/Electrolyte Disorders -Replete -Repeat labs  ID Possible Aspiration PNA -CXR -NPO -Monitor  Prophylaxis DVT:   Hold sqh until  OK by IR  GI: NA Bowel: doc senaa   Diet: NPO until cleared by speech  Code Status: Full Code   [place] -Neurology called for a routine consultation and not paged as a code stroke due to initial LKN told by family to be 1100 on 11/15/17 whereas neurology interview yielded LKN of 2130 hrs 11/15/17. -No family at bedside and patient with severe dysarthria. Consent had to be obtained from son Mckinnon Glick over phone.  Please feel free to call with any questions. --- Amie Portland, MD [facility] Pager: [phone]  If 7pm to 7am, please call on call as listed on AMION.   CRITICAL CARE ATTESTATION This patient is critically ill and at significant risk of neurological worsening, death and care requires constant monitoring of vital signs, hemodynamics,respiratory and cardiac monitoring. I spent 70  minutes of neurocritical care time performing neurological assessment, discussion with family, other specialists and medical decision making of high complexityin the care of  this patient.

## 2017-11-17 ENCOUNTER — Inpatient Hospital Stay (HOSPITAL_COMMUNITY): Payer: Medicare Other

## 2017-11-17 ENCOUNTER — Encounter (HOSPITAL_COMMUNITY): Payer: Self-pay | Admitting: Interventional Radiology

## 2017-11-17 DIAGNOSIS — I361 Nonrheumatic tricuspid (valve) insufficiency: Secondary | ICD-10-CM

## 2017-11-17 DIAGNOSIS — E785 Hyperlipidemia, unspecified: Secondary | ICD-10-CM

## 2017-11-17 DIAGNOSIS — R1312 Dysphagia, oropharyngeal phase: Secondary | ICD-10-CM

## 2017-11-17 DIAGNOSIS — Z950 Presence of cardiac pacemaker: Secondary | ICD-10-CM

## 2017-11-17 DIAGNOSIS — I6601 Occlusion and stenosis of right middle cerebral artery: Secondary | ICD-10-CM

## 2017-11-17 LAB — LIPID PANEL
Cholesterol: 151 mg/dL (ref 0–200)
HDL: 37 mg/dL — ABNORMAL LOW (ref >40)
LDL CALC: 102 mg/dL — AB (ref 0–99)
Total CHOL/HDL Ratio: 4.1 RATIO
Triglycerides: 61 mg/dL (ref <150)
VLDL: 12 mg/dL (ref 0–40)

## 2017-11-17 LAB — BASIC METABOLIC PANEL
Anion gap: 8 (ref 5–15)
BUN: 24 mg/dL — AB (ref 8–23)
CHLORIDE: 109 mmol/L (ref 98–111)
CO2: 22 mmol/L (ref 22–32)
CREATININE: 1.26 mg/dL — AB (ref 0.61–1.24)
Calcium: 8.5 mg/dL — ABNORMAL LOW (ref 8.9–10.3)
GFR calc Af Amer: 58 mL/min — ABNORMAL LOW (ref >60)
GFR calc non Af Amer: 50 mL/min — ABNORMAL LOW (ref >60)
GLUCOSE: 184 mg/dL — AB (ref 70–99)
Potassium: 4.4 mmol/L (ref 3.5–5.1)
SODIUM: 139 mmol/L (ref 135–145)

## 2017-11-17 LAB — CBC WITH DIFFERENTIAL/PLATELET
Abs Immature Granulocytes: 0.1 10*3/uL (ref 0.0–0.1)
BASOS ABS: 0 10*3/uL (ref 0.0–0.1)
Basophils Relative: 0 %
EOS PCT: 0 %
Eosinophils Absolute: 0 10*3/uL (ref 0.0–0.7)
HCT: 44.7 % (ref 39.0–52.0)
HEMOGLOBIN: 14.5 g/dL (ref 13.0–17.0)
Immature Granulocytes: 1 %
Lymphocytes Relative: 10 %
Lymphs Abs: 1.5 10*3/uL (ref 0.7–4.0)
MCH: 29.6 pg (ref 26.0–34.0)
MCHC: 32.4 g/dL (ref 30.0–36.0)
MCV: 91.2 fL (ref 78.0–100.0)
MONO ABS: 0.7 10*3/uL (ref 0.1–1.0)
Monocytes Relative: 5 %
Neutro Abs: 12.8 10*3/uL — ABNORMAL HIGH (ref 1.7–7.7)
Neutrophils Relative %: 84 %
Platelets: 196 10*3/uL (ref 150–400)
RBC: 4.9 MIL/uL (ref 4.22–5.81)
RDW: 13.2 % (ref 11.5–15.5)
WBC: 15.1 10*3/uL — ABNORMAL HIGH (ref 4.0–10.5)

## 2017-11-17 LAB — HEMOGLOBIN A1C
Hgb A1c MFr Bld: 6.1 % — ABNORMAL HIGH (ref 4.8–5.6)
Mean Plasma Glucose: 128.37 mg/dL

## 2017-11-17 LAB — ECHOCARDIOGRAM COMPLETE
HEIGHTINCHES: 68 in
Weight: 3086.44 oz

## 2017-11-17 LAB — GLUCOSE, CAPILLARY: GLUCOSE-CAPILLARY: 173 mg/dL — AB (ref 70–99)

## 2017-11-17 MED ORDER — CARVEDILOL 12.5 MG PO TABS
12.5000 mg | ORAL_TABLET | Freq: Two times a day (BID) | ORAL | Status: DC
  Administered 2017-11-17 – 2017-11-19 (×5): 12.5 mg via ORAL
  Filled 2017-11-17 (×6): qty 1

## 2017-11-17 MED ORDER — ASPIRIN 300 MG RE SUPP
300.0000 mg | Freq: Every day | RECTAL | Status: DC
  Administered 2017-11-17: 300 mg via RECTAL

## 2017-11-17 MED ORDER — NICOTINE 7 MG/24HR TD PT24
7.0000 mg | MEDICATED_PATCH | Freq: Every day | TRANSDERMAL | Status: DC
  Administered 2017-11-17 – 2017-11-21 (×5): 7 mg via TRANSDERMAL
  Filled 2017-11-17 (×5): qty 1

## 2017-11-17 MED ORDER — ENOXAPARIN SODIUM 40 MG/0.4ML ~~LOC~~ SOLN
40.0000 mg | SUBCUTANEOUS | Status: DC
  Administered 2017-11-17 – 2017-11-21 (×5): 40 mg via SUBCUTANEOUS
  Filled 2017-11-17 (×5): qty 0.4

## 2017-11-17 MED ORDER — ASPIRIN EC 325 MG PO TBEC
325.0000 mg | DELAYED_RELEASE_TABLET | Freq: Every day | ORAL | Status: DC
  Administered 2017-11-18 – 2017-11-21 (×4): 325 mg via ORAL
  Filled 2017-11-17 (×4): qty 1

## 2017-11-17 MED ORDER — RESOURCE THICKENUP CLEAR PO POWD
ORAL | Status: DC | PRN
  Filled 2017-11-17 (×2): qty 125

## 2017-11-17 MED ORDER — QUETIAPINE FUMARATE 25 MG PO TABS
25.0000 mg | ORAL_TABLET | Freq: Two times a day (BID) | ORAL | Status: DC
  Administered 2017-11-17 – 2017-11-18 (×4): 25 mg via ORAL
  Filled 2017-11-17 (×5): qty 1

## 2017-11-17 NOTE — Progress Notes (Signed)
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 Estanislado Pandy. Patient awake and alert laying in bed with no complaints at this time. Accompanied by son at bedside. Patient's condition improving- can spontaneously move all extremities, demonstrates slurred speech and right gaze preference. Right groin incision c/d/i.   Allergies: Warfarin and related; Atorvastatin; Ezetimibe; Diazepam; and Penicillins  Medications: Prior to Admission medications   Medication Sig Start Date End Date Taking? Authorizing Provider  ALPRAZolam (XANAX) 0.5 MG tablet TAKE ONE-HALF TABLET BY MOUTH TWICE DAILY AS NEEDED FOR ANXIETY Patient taking differently: Take 0.25 mg by mouth 2 (two) times daily as needed for anxiety. TAKE ONE-HALF TABLET BY MOUTH TWICE DAILY AS NEEDED FOR ANXIETY 10/14/17  Yes Tonia Ghent, MD  aspirin 81 MG tablet Take 81 mg by mouth 2 (two) times daily.    Yes [provider]  calcium carbonate (TUMS - DOSED IN MG ELEMENTAL CALCIUM) 500 MG chewable tablet Chew 1 tablet by mouth daily as needed for indigestion.    Yes [provider]  carvedilol (COREG) 25 MG tablet TAKE 1 TABLET BY MOUTH TWICE DAILY WITH  A  MEAL. 08/03/17  Yes Josue Hector, MD  docusate sodium (COLACE) 100 MG capsule Take 1 capsule (100 mg total) by mouth daily as needed for mild constipation. 01/23/17  Yes Tonia Ghent, MD  hydrochlorothiazide (MICROZIDE) 12.5 MG capsule TAKE ONE CAPSULE BY MOUTH ONCE DAILY 07/27/17  Yes Josue Hector, MD  Magnesium Hydroxide (MILK OF MAGNESIA PO) Take 1 tablet by mouth daily as needed.   Yes [provider]  nitroGLYCERIN (NITROSTAT) 0.4 MG SL tablet Place 1 tablet (0.4 mg total) under the tongue every 5 (five) minutes as needed for chest pain. 01/07/15  Yes  Josue Hector, MD  pantoprazole (PROTONIX) 40 MG tablet Take 1 tablet (40 mg total) by mouth daily. 01/06/16  Yes Domenic Moras, PA-C  ranitidine (ZANTAC) 150 MG tablet TAKE 1 TABLET BY MOUTH ONCE DAILY 07/27/17  Yes Josue Hector, MD     Vital Signs: BP 129/78   Pulse 73   Temp 98.5 F (36.9 C)   Resp 19   Ht 5\' 8"  (1.727 m)   Wt 192 lb 14.4 oz (87.5 kg)   SpO2 95%   BMI 29.33 kg/m   Physical Exam  Constitutional: He appears well-developed and well-nourished. No distress.  Cardiovascular: Normal rate, regular rhythm and normal heart sounds.  No murmur heard. Pulmonary/Chest: Effort normal and breath sounds normal. No respiratory distress. He has no wheezes.  Neurological:  Alert and awake, follows simple commands. Demonstrates slurred speech. PERRL bilaterally. Demonstrates right gaze preference. Visual fields not assessed. No facial asymmetry. Tongue midline. Can spontaneously move all extremities, hand grip strength R>L. Pronator drift not assessed. Fine motor and coordination not assessed. Gait not assessed. Romberg not assessed. Heel to toe not assessed. Distal pulses 1+ bilaterally.  Skin: Skin is warm and dry.  Right groin incision soft without active bleeding or hematoma.  Psychiatric: He has a normal mood and affect. His behavior is normal. Judgment and thought content normal.  Nursing note and vitals reviewed.   Imaging: Ct Angio Head W Or Wo Contrast  Result Date: 11/16/2017 CLINICAL DATA:  84 year old male code stroke. Altered mental status. Evidence of right basal ganglia/MCA lacunar and possible hyperdense right  MCA on noncontrast head CT 1309 hours today. EXAM: CT ANGIOGRAPHY HEAD AND NECK CT PERFUSION BRAIN TECHNIQUE: Multidetector CT imaging of the head and neck was performed using the standard protocol during bolus administration of intravenous contrast. Multiplanar CT image reconstructions and MIPs were obtained to evaluate the vascular anatomy.  Carotid stenosis measurements (when applicable) are obtained utilizing NASCET criteria, using the distal internal carotid diameter as the denominator. Multiphase CT imaging of the brain was performed following IV bolus contrast injection. Subsequent parametric perfusion maps were calculated using RAPID software. CONTRAST:  72mL ISOVUE-370 IOPAMIDOL (ISOVUE-370) INJECTION 76% COMPARISON:  Head CT without contrast 1309 hours today. FINDINGS: CT Brain Perfusion Findings: ASPECTS approximately 7 on the earlier noncontrast CT. CBF (<30%) Volume: 0 milliliters (CBF less than 38% the techs 5 milliliters along the posterior operculum). Perfusion (Tmax>6.0s) volume: 41 milliliters Mismatch Volume: 41 milliliters Infarction Location:Posterior right MCA territory. Other findings:  Some motion artifact noted CTA NECK Skeleton: Absent dentition. No acute osseous abnormality identified. Upper chest: Left chest cardiac pacemaker type device. Mild dependent pulmonary atelectasis. No superior mediastinal lymphadenopathy. Other neck: Negative.  No neck mass or lymphadenopathy. Aortic arch: 3 vessel arch configuration. Mild to moderate arch atherosclerosis. Right carotid system: No brachiocephalic or right CCA origin stenosis. Minimal right CCA plaque including at the right carotid bifurcation. Mild soft plaque at the right ICA origin. No cervical right ICA stenosis. Left carotid system: No left CCA origin stenosis. Minimal left CCA plaque including at the left carotid bifurcation. Mild proximal left ICA plaque with no stenosis. Vertebral arteries: No proximal right subclavian artery stenosis. Normal right vertebral artery origin. Patent right vertebral artery to the skull base without stenosis. No proximal left subclavian artery stenosis despite mild plaque. Soft and calcified plaque at the left vertebral artery origin and proximal V1 segment (series 10, image 29), with only mild stenosis. Codominant vertebral arteries, the left is  patent to the skull base with mild V3 segment plaque but no stenosis. CTA HEAD Posterior circulation: Codominant distal vertebral arteries are patent without stenosis. Patent PICA origins and vertebrobasilar junction. Patent basilar artery without stenosis. Normal SCA and left PCA origin. Tortuous right P1 segment and right posterior communicating artery. The left Posterior communicating artery is diminutive or absent. Bilateral PCA branches are within normal limits. Anterior circulation: Both ICA siphons are patent. There is only minor siphon calcified plaque, greater on the left. Normal ophthalmic and right posterior communicating artery origins. Patent carotid termini. Normal MCA and ACA origins. Mildly tortuous A1 segments. Anterior communicating artery and bilateral ACA branches are within normal limits. Left MCA M1 segment is tortuous. Left MCA bifurcation is patent without stenosis. Left MCA branches are within normal limits. Right MCA M1 segment is tortuous but patent without stenosis. Right MCA bifurcation is patent. No right MCA M2 branch occlusion identified. Areas of moderate to severe right M3 branch irregularity are noted on series 14, image 15. Venous sinuses: Patent. Anatomic variants: None. Review of the MIP images confirms the above findings IMPRESSION: 1. Negative for large vessel occlusion. No discrete right MCA branch occlusion is identified, but moderate to severe right M3 branch irregularity is noted. 2. CT perfusion suggests posterior right MCA territory penumbra, but does not detect core infarct with the standard parameters. However, a small 5 mL infarct is suggested with more sensitive CBF (<38%) parameter, located along the posterior operculum. Therefore, it is possible that the right lentiform finding is chronic. 3. Mild for age atherosclerosis in the head and neck  with no hemodynamically significant arterial stenosis identified outside of the right M3 findings in #1. 4.  Study discussed  by telephone on 11/16/2017 at 15:19 to Dr. Rory Percy. Electronically Signed   By: Genevie Ann M.D.   On: 11/16/2017 15:20   Ct Head Wo Contrast  Result Date: 11/16/2017 CLINICAL DATA:  Altered mental status. EXAM: CT HEAD WITHOUT CONTRAST TECHNIQUE: Contiguous axial images were obtained from the base of the skull through the vertex without intravenous contrast. COMPARISON:  None. FINDINGS: Brain: Hypodensity within the right basal ganglia, external capsule, and posterior operculum. No evidence of hemorrhage, hydrocephalus, extra-axial collection or mass lesion/mass effect. Moderate age related cerebral atrophy. Scattered moderate periventricular and subcortical white matter hypodensities are nonspecific, but favored to reflect chronic microvascular ischemic changes. Vascular: Right distal MCA branch hyperdensity near the operculum likely reflecting thrombus (series 5, image 46). Atherosclerotic vascular calcification of the carotid siphons. Skull: Negative for fracture or focal lesion. Sinuses/Orbits: No acute finding. Other: None. IMPRESSION: 1. Acute to subacute infarct in the right basal ganglia, external capsule, and posterior operculum. Right distal MCA branch hyperdensity near the operculum likely reflects thrombus. These results were called by telephone at the time of interpretation on 11/16/2017 at 1:47 pm to Dr. Carmin Muskrat , who verbally acknowledged these results. Electronically Signed   By: Titus Dubin M.D.   On: 11/16/2017 13:49   Ct Angio Neck W Or Wo Contrast  Result Date: 11/16/2017 CLINICAL DATA:  84 year old male code stroke. Altered mental status. Evidence of right basal ganglia/MCA lacunar and possible hyperdense right MCA on noncontrast head CT 1309 hours today. EXAM: CT ANGIOGRAPHY HEAD AND NECK CT PERFUSION BRAIN TECHNIQUE: Multidetector CT imaging of the head and neck was performed using the standard protocol during bolus administration of intravenous contrast. Multiplanar CT image  reconstructions and MIPs were obtained to evaluate the vascular anatomy. Carotid stenosis measurements (when applicable) are obtained utilizing NASCET criteria, using the distal internal carotid diameter as the denominator. Multiphase CT imaging of the brain was performed following IV bolus contrast injection. Subsequent parametric perfusion maps were calculated using RAPID software. CONTRAST:  65mL ISOVUE-370 IOPAMIDOL (ISOVUE-370) INJECTION 76% COMPARISON:  Head CT without contrast 1309 hours today. FINDINGS: CT Brain Perfusion Findings: ASPECTS approximately 7 on the earlier noncontrast CT. CBF (<30%) Volume: 0 milliliters (CBF less than 38% the techs 5 milliliters along the posterior operculum). Perfusion (Tmax>6.0s) volume: 41 milliliters Mismatch Volume: 41 milliliters Infarction Location:Posterior right MCA territory. Other findings:  Some motion artifact noted CTA NECK Skeleton: Absent dentition. No acute osseous abnormality identified. Upper chest: Left chest cardiac pacemaker type device. Mild dependent pulmonary atelectasis. No superior mediastinal lymphadenopathy. Other neck: Negative.  No neck mass or lymphadenopathy. Aortic arch: 3 vessel arch configuration. Mild to moderate arch atherosclerosis. Right carotid system: No brachiocephalic or right CCA origin stenosis. Minimal right CCA plaque including at the right carotid bifurcation. Mild soft plaque at the right ICA origin. No cervical right ICA stenosis. Left carotid system: No left CCA origin stenosis. Minimal left CCA plaque including at the left carotid bifurcation. Mild proximal left ICA plaque with no stenosis. Vertebral arteries: No proximal right subclavian artery stenosis. Normal right vertebral artery origin. Patent right vertebral artery to the skull base without stenosis. No proximal left subclavian artery stenosis despite mild plaque. Soft and calcified plaque at the left vertebral artery origin and proximal V1 segment (series 10, image  29), with only mild stenosis. Codominant vertebral arteries, the left is patent to the skull base  with mild V3 segment plaque but no stenosis. CTA HEAD Posterior circulation: Codominant distal vertebral arteries are patent without stenosis. Patent PICA origins and vertebrobasilar junction. Patent basilar artery without stenosis. Normal SCA and left PCA origin. Tortuous right P1 segment and right posterior communicating artery. The left Posterior communicating artery is diminutive or absent. Bilateral PCA branches are within normal limits. Anterior circulation: Both ICA siphons are patent. There is only minor siphon calcified plaque, greater on the left. Normal ophthalmic and right posterior communicating artery origins. Patent carotid termini. Normal MCA and ACA origins. Mildly tortuous A1 segments. Anterior communicating artery and bilateral ACA branches are within normal limits. Left MCA M1 segment is tortuous. Left MCA bifurcation is patent without stenosis. Left MCA branches are within normal limits. Right MCA M1 segment is tortuous but patent without stenosis. Right MCA bifurcation is patent. No right MCA M2 branch occlusion identified. Areas of moderate to severe right M3 branch irregularity are noted on series 14, image 15. Venous sinuses: Patent. Anatomic variants: None. Review of the MIP images confirms the above findings IMPRESSION: 1. Negative for large vessel occlusion. No discrete right MCA branch occlusion is identified, but moderate to severe right M3 branch irregularity is noted. 2. CT perfusion suggests posterior right MCA territory penumbra, but does not detect core infarct with the standard parameters. However, a small 5 mL infarct is suggested with more sensitive CBF (<38%) parameter, located along the posterior operculum. Therefore, it is possible that the right lentiform finding is chronic. 3. Mild for age atherosclerosis in the head and neck with no hemodynamically significant arterial  stenosis identified outside of the right M3 findings in #1. 4.  Study discussed by telephone on 11/16/2017 at 15:19 to Dr. Rory Percy. Electronically Signed   By: Genevie Ann M.D.   On: 11/16/2017 15:20   Ct Cerebral Perfusion W Contrast  Result Date: 11/16/2017 CLINICAL DATA:  84 year old male code stroke. Altered mental status. Evidence of right basal ganglia/MCA lacunar and possible hyperdense right MCA on noncontrast head CT 1309 hours today. EXAM: CT ANGIOGRAPHY HEAD AND NECK CT PERFUSION BRAIN TECHNIQUE: Multidetector CT imaging of the head and neck was performed using the standard protocol during bolus administration of intravenous contrast. Multiplanar CT image reconstructions and MIPs were obtained to evaluate the vascular anatomy. Carotid stenosis measurements (when applicable) are obtained utilizing NASCET criteria, using the distal internal carotid diameter as the denominator. Multiphase CT imaging of the brain was performed following IV bolus contrast injection. Subsequent parametric perfusion maps were calculated using RAPID software. CONTRAST:  69mL ISOVUE-370 IOPAMIDOL (ISOVUE-370) INJECTION 76% COMPARISON:  Head CT without contrast 1309 hours today. FINDINGS: CT Brain Perfusion Findings: ASPECTS approximately 7 on the earlier noncontrast CT. CBF (<30%) Volume: 0 milliliters (CBF less than 38% the techs 5 milliliters along the posterior operculum). Perfusion (Tmax>6.0s) volume: 41 milliliters Mismatch Volume: 41 milliliters Infarction Location:Posterior right MCA territory. Other findings:  Some motion artifact noted CTA NECK Skeleton: Absent dentition. No acute osseous abnormality identified. Upper chest: Left chest cardiac pacemaker type device. Mild dependent pulmonary atelectasis. No superior mediastinal lymphadenopathy. Other neck: Negative.  No neck mass or lymphadenopathy. Aortic arch: 3 vessel arch configuration. Mild to moderate arch atherosclerosis. Right carotid system: No brachiocephalic or  right CCA origin stenosis. Minimal right CCA plaque including at the right carotid bifurcation. Mild soft plaque at the right ICA origin. No cervical right ICA stenosis. Left carotid system: No left CCA origin stenosis. Minimal left CCA plaque including at the left carotid bifurcation.  Mild proximal left ICA plaque with no stenosis. Vertebral arteries: No proximal right subclavian artery stenosis. Normal right vertebral artery origin. Patent right vertebral artery to the skull base without stenosis. No proximal left subclavian artery stenosis despite mild plaque. Soft and calcified plaque at the left vertebral artery origin and proximal V1 segment (series 10, image 29), with only mild stenosis. Codominant vertebral arteries, the left is patent to the skull base with mild V3 segment plaque but no stenosis. CTA HEAD Posterior circulation: Codominant distal vertebral arteries are patent without stenosis. Patent PICA origins and vertebrobasilar junction. Patent basilar artery without stenosis. Normal SCA and left PCA origin. Tortuous right P1 segment and right posterior communicating artery. The left Posterior communicating artery is diminutive or absent. Bilateral PCA branches are within normal limits. Anterior circulation: Both ICA siphons are patent. There is only minor siphon calcified plaque, greater on the left. Normal ophthalmic and right posterior communicating artery origins. Patent carotid termini. Normal MCA and ACA origins. Mildly tortuous A1 segments. Anterior communicating artery and bilateral ACA branches are within normal limits. Left MCA M1 segment is tortuous. Left MCA bifurcation is patent without stenosis. Left MCA branches are within normal limits. Right MCA M1 segment is tortuous but patent without stenosis. Right MCA bifurcation is patent. No right MCA M2 branch occlusion identified. Areas of moderate to severe right M3 branch irregularity are noted on series 14, image 15. Venous sinuses: Patent.  Anatomic variants: None. Review of the MIP images confirms the above findings IMPRESSION: 1. Negative for large vessel occlusion. No discrete right MCA branch occlusion is identified, but moderate to severe right M3 branch irregularity is noted. 2. CT perfusion suggests posterior right MCA territory penumbra, but does not detect core infarct with the standard parameters. However, a small 5 mL infarct is suggested with more sensitive CBF (<38%) parameter, located along the posterior operculum. Therefore, it is possible that the right lentiform finding is chronic. 3. Mild for age atherosclerosis in the head and neck with no hemodynamically significant arterial stenosis identified outside of the right M3 findings in #1. 4.  Study discussed by telephone on 11/16/2017 at 15:19 to Dr. Rory Percy. Electronically Signed   By: Genevie Ann M.D.   On: 11/16/2017 15:20   Dg Chest Port 1 View  Result Date: 11/16/2017 CLINICAL DATA:  Encounter for AMS and SOB EXAM: PORTABLE CHEST - 1 VIEW COMPARISON:  01/06/2016 FINDINGS: Relatively low lung volumes with crowding of bronchovascular structures. No confluent airspace infiltrate or overt edema. Heart size upper limits normal for technique. No effusion or pneumothorax. Stable left subclavian single lead transvenous pacemaker extending towards the right ventricular apex. Lumbar fixation hardware partially visualized. IMPRESSION: No acute cardiopulmonary disease. Electronically Signed   By: Lucrezia Europe M.D.   On: 11/16/2017 11:49    Labs:  CBC: Recent Labs    01/23/17 1531 11/16/17 1134 11/17/17 0500  WBC 9.0 14.9* 15.1*  HGB 15.4 16.7 14.5  HCT 46.2 51.5 44.7  PLT 245.0 204 196    COAGS: No results for input(s): INR, APTT in the last 8760 hours.  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 Estanislado Pandy. Patient's condition improving- can spontaneously move all extremities, demonstrates slurred speech and right gaze preference. Right groin incision stable. Appreciate and agree with neurology management.   Electronically Signed: Earley Abide, PA-C 11/17/2017, 11:11 AM   I spent a total of 15 Minutes at the the patient's bedside AND on the patient's hospital floor or unit, greater than 50% of which was counseling/coordinating care for right MCA inferior division distal M2 segment occlusion s/p revascularization.

## 2017-11-17 NOTE — Progress Notes (Signed)
STROKE TEAM PROGRESS NOTE   SUBJECTIVE (INTERVAL HISTORY) His two sons are at the bedside.  Pt drowsy sleepy and severe dysarthria. Right gaze preference and left sided neglect and weakness. S/p intervention. Pending MRI and MRA and speech. On low dose cardene.    OBJECTIVE Temp:  [97.4 F (36.3 C)-99.2 F (37.3 C)] 99.2 F (37.3 C) (07/19 0400) Pulse Rate:  [72-119] 82 (07/19 0700) Cardiac Rhythm: Atrial fibrillation (07/18 2000) Resp:  [15-27] 21 (07/19 0700) BP: (103-194)/(52-125) 116/70 (07/19 0700) SpO2:  [87 %-99 %] 96 % (07/19 0700) Arterial Line BP: (100-137)/(46-57) 100/51 (07/19 0700) Weight:  [192 lb 14.4 oz (87.5 kg)] 192 lb 14.4 oz (87.5 kg) (07/18 1845)  CBC:  Recent Labs  Lab 11/16/17 1134 11/17/17 0500  WBC 14.9* 15.1*  NEUTROABS 11.6* 12.8*  HGB 16.7 14.5  HCT 51.5 44.7  MCV 91.0 91.2  PLT 204 465    Basic Metabolic Panel:  Recent Labs  Lab 11/16/17 1134 11/17/17 0500  NA 138 139  K 4.2 4.4  CL 104 109  CO2 23 22  GLUCOSE 124* 184*  BUN 21 24*  CREATININE 1.25* 1.26*  CALCIUM 9.6 8.5*    Lipid Panel:     Component Value Date/Time   CHOL 151 11/17/2017 0500   TRIG 61 11/17/2017 0500   HDL 37 (L) 11/17/2017 0500   CHOLHDL 4.1 11/17/2017 0500   VLDL 12 11/17/2017 0500   LDLCALC 102 (H) 11/17/2017 0500   HgbA1c:  Lab Results  Component Value Date   HGBA1C 6.1 (H) 11/17/2017   Urine Drug Screen: No results found for: LABOPIA, COCAINSCRNUR, LABBENZ, AMPHETMU, THCU, LABBARB  Alcohol Level No results found for: [place] I have personally reviewed the radiological images below and agree with the radiology interpretations.  Ct Angio Head W Or Wo Contrast Ct Angio Neck W Or Wo Contrast Ct Cerebral Perfusion W Contrast 11/16/2017 IMPRESSION:  1. Negative for large vessel occlusion. No discrete right MCA branch occlusion is identified, but moderate to severe right M3 branch irregularity is noted.  2. CT perfusion suggests posterior  right MCA territory penumbra, but does not detect core infarct with the standard parameters. However, a small 5 mL infarct is suggested with more sensitive CBF (<38%) parameter, located along the posterior operculum. Therefore, it is possible that the right lentiform finding is chronic.  3. Mild for age atherosclerosis in the head and neck with no hemodynamically significant arterial stenosis identified outside of the right M3 findings in #1.   Ct Head Wo Contrast 11/16/2017 IMPRESSION:  Acute to subacute infarct in the right basal ganglia, external capsule, and posterior operculum. Right distal MCA branch hyperdensity near the operculum likely reflects thrombus.   Transthoracic Echocardiogram - pending 00/00/00  CT repeat pending  Cerebral angiogram and Intervention - Dr Estanislado Pandy 11/16/2017 S/P RT common carotid arteriogram followed by endovascular revascularization of occluded RT MCA inf division distal M2 seg  With x 1 pass with  With 88mm x 20 mm trevoprovue retriever device achieving a TICI 2bplus revascularization.   PHYSICAL EXAM  Temp:  [97.4 F (36.3 C)-99.2 F (37.3 C)] 98.5 F (36.9 C) (07/19 0800) Pulse Rate:  [72-119] 73 (07/19 1000) Resp:  [15-27] 19 (07/19 1000) BP: (103-194)/(52-125) 129/78 (07/19 1000) SpO2:  [87 %-99 %] 95 % (07/19 1000) Arterial Line BP: (100-137)/(46-57) 100/51 (07/19 0700) Weight:  [192 lb 14.4 oz (87.5 kg)] 192 lb 14.4 oz (87.5 kg) (07/18 1845)  General - Well nourished, well developed, in  no apparent distress.  Ophthalmologic - fundi not visualized due to noncooperation.  Cardiovascular - irregularly irregular heart rate and rhythm.  Neuro - drowsy but easily arousable, orientated to place and people, but not to time. Severe dysarthria but able to name and repeat. Following commands. PERRL, right gaze preference, not crossing midline. Left neglect, not blinking to visual threat on the left. Left facial droop. Tongue midline. Left UE 1/5 and  LE 2/5 on pain stimulation. RUE and RLE 4/5. DTR 1+ and left babinski positive. Sensation symmetrical, coordination not cooperative and gait not tested.   ASSESSMENT/PLAN Mr. LEGACY LACIVITA is a 84 y.o. male with history of permanent atrial fibrillation (not anticoagulated ), status post [employer] permanent pacemaker for complete heart block, hypertension, hyperlipidemia, chronic back pain, and anxiety presenting with left-sided weakness and right gaze preference. He did not receive IV t-PA due to late presentation. S/P thrombectomy Dr Estanislado Pandy.  Stroke: right MCA infarct, embolic secondary to atrial fibrillation not on AC.  Resultant  Left neglect, right gaze and left hemiparesis  CT head - Acute to subacute infarct in the right basal ganglia, external capsule, and posterior operculum. Right distal MCA branch hyperdensity near the operculum likely reflects thrombus.   CTA H&N - likely right M3 occlusion.   CT Perfusion - suggests posterior right MCA territory penumbra - (?? At least partial pseudo-normalization, exact size of penumbra is not clear)  DSA - right M2 occlusion s/p IR with TICI 2b+ reperfusion  MRI head - not able to do due to PPM  Repeat CT in am to evaluate the size of stroke  2D Echo - pending  LDL - 102  HgbA1c - 6.1  VTE prophylaxis - lovenox Diet Order           Diet NPO time specified  Diet effective now           aspirin 81 mg daily prior to admission, now on aspirin suppository 300 mg daily.  Ongoing aggressive stroke risk factor management  Therapy recommendations:  pending  Disposition:  Pending  Afib not on AC  Hx of afib following with cardiology  Refused coumadin in the past due to ? Coumadin made leg weak  On ASA for now  Consider DOAC and timing based on MRI or CT repeat later.  In hospital delirium  Hx of in hospital delirium  High risk for delirium  Put on seroquel 25mg  bid once pass swallow  Non-pharmacological means  first  Consider Haldol IV if needed  Hypertension  Stable on Cardene . BP goal < 140 after IR procedure . Wean off cardene  . Long-term BP goal normotensive  Hyperlipidemia  Lipid lowering medication PTA:  none  LDL 102, goal < 70  Current lipid lowering medication: NPO -add statin with p.o. access  Continue statin at discharge  Dysphagia   Severe dysarthria  Speech pending  If not passing swallow, pt agreed with cortrak.  Chewing tobacco  Put on nicotine patch  Could be part of the reason of delirium  Other Stroke Risk Factors  Advanced age  Former cigarette smoker - quit  Other Active Problems  Complete heart block s/p Permanent pacemaker  Renal insufficiency BUN 26 ; creatinine 1.26 -> daily labs  Leukocytosis - 15.1 (ax temp 99.2) -> daily labs  Hospital day # 1  This patient is critically ill due to right MCA stroke, afib not on AC, dysphagia, in hospital delirium and at significant risk of neurological worsening, death form  recurrent stroke, hemorrhagic conversion, delirium, seizure, aspiration. This patient's care requires constant monitoring of vital signs, hemodynamics, respiratory and cardiac monitoring, review of multiple databases, neurological assessment, discussion with family, other specialists and medical decision making of high complexity. I spent 45 minutes of neurocritical care time in the care of this patient. I had long discussion with two sons and pt at bedside, updated pt current condition, treatment plan and potential prognosis. They expressed understanding and appreciation.   Rosalin Hawking, MD PhD Stroke Neurology 11/17/2017 11:12 AM   To contact Stroke Continuity provider, please refer to [email or web address] After hours, contact General Neurology

## 2017-11-17 NOTE — Plan of Care (Signed)
Pt was given seroquel at noon for in hospital delirium. RN reported drowsiness at this time. Most likely medication effect, but will rule out any brain bleed. Plan for tomorrow CT initially, but will change to stat CT this time.   Rosalin Hawking, MD PhD Stroke Neurology 11/17/2017 4:17 PM

## 2017-11-17 NOTE — Progress Notes (Signed)
  Echocardiogram 2D Echocardiogram has been performed.  Dakota [name] F 11/17/2017, 3:07 PM

## 2017-11-17 NOTE — Addendum Note (Signed)
Addendum  created 11/17/17 1522 by Lillia Abed, MD   Attestation recorded in [place], [place] filed

## 2017-11-17 NOTE — Evaluation (Signed)
Occupational Therapy Evaluation Patient Details Name: Dakota [name] MRN: [id number] DOB: 12/17/1928 Today's Date: 11/17/2017    History of Present Illness 84 yo admitted with left weakness and right gaze preference with right basal ganglia infarct s/p revascularization. 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, atrial fibrillation   Clinical Impression   PT admitted with R basal ganglia infarct with revascularization. Pt currently with functional limitiations due to the deficits listed below (see OT problem list). Pt requires (A) for basic transfer at this time total+2 with R gaze preference. Recommend two person R side transfer with staff x3 for meals this admission.  Pt will benefit from skilled OT to increase their independence and safety with adls and balance to allow discharge CIR.     Follow Up Recommendations  CIR    Equipment Recommendations  3 in 1 bedside commode;Wheelchair (measurements OT);Wheelchair cushion (measurements OT);Hospital bed    Recommendations for Other Services Rehab consult     Precautions / Restrictions Precautions Precautions: Fall Precaution Comments: right gaze, left hemiparesis Restrictions Weight Bearing Restrictions: No      Mobility Bed Mobility Overal bed mobility: Needs Assistance Bed Mobility: Rolling;Sidelying to Sit Rolling: Max assist;+2 for safety/equipment Sidelying to sit: Max assist;+2 for physical assistance;HOB elevated       General bed mobility comments: max assist to bend left knee, move LUE onto chest and roll to right, pt assisting with RUE. Assist to elevate trunk and bring legs off of bed  Transfers Overall transfer level: Needs assistance   Transfers: Sit to/from Stand;Stand Pivot Transfers Sit to Stand: Mod assist;+2 physical assistance Stand pivot transfers: Max assist;+2 physical assistance       General transfer comment: pt stood from bed and chair with left knee blocked and pt  activating bil quad and rising with cues to initiate and assist at belt and trunk for stability. Pt able to pivot to right from bed<>chair with assist for balance, stepping and sequence. Pt with blocking left knee and facilitation to step and extend trunk.     Balance Overall balance assessment: Needs assistance Sitting-balance support: Single extremity supported;Feet supported Sitting balance-Leahy Scale: Zero Sitting balance - Comments: pt with initial posterior lean with transition to left lean and progression from max to mod assist with sitting grossly 6 min EOB. With cues pt able to reach to right and lean right Postural control: Posterior lean;Left lateral lean Standing balance support: Single extremity supported Standing balance-Leahy Scale: Poor Standing balance comment: bil UE support in standing and assist for balance and stability                            ADL either performed or assessed with clinical judgement   ADL Overall ADL's : Needs assistance/impaired Eating/Feeding: NPO Eating/Feeding Details (indicate cue type and reason): dentures removed. pt with only bottom plate and son uncertain where the top plate is located     Upper Body Bathing: Maximal assistance   Lower Body Bathing: Total assistance   Upper Body Dressing : Maximal assistance   Lower Body Dressing: Total assistance   Toilet Transfer: +2 for physical assistance;Maximal assistance Toilet Transfer Details (indicate cue type and reason): requires (A) to shift weight toward chair and bd           General ADL Comments: simulated OOB to chair. Pt positioned in chair and RN arriving to place patient back in bed for MRI. pt transfered back to bed  surface. Encouragement for staff to get patient OOB for all meal     Vision Baseline Vision/History: No visual deficits Vision Assessment?: Yes Eye Alignment: Impaired (comment) Ocular Range of Motion: Impaired-to be further tested in functional  context Alignment/Gaze Preference: Head turned;Gaze right Tracking/Visual Pursuits: Impaired - to be further tested in functional context Additional Comments: pt with head and neck rotation to the R. pt is able to pass midline visually with max encouragment. Threapist holding patients hand and tapping it and talking to patient to keep him tracking to midline. pt once midline jumps back to the right. pt becoming restless and moving R side with son calling name and saying ot locate him.pt present fall risk with this strategy      Perception     Praxis      Pertinent Vitals/Pain Pain Assessment: No/denies pain     Hand Dominance Right   Extremity/Trunk Assessment Upper Extremity Assessment Upper Extremity Assessment: Generalized weakness;LUE deficits/detail LUE Deficits / Details: noted active movement spontaneously but not to command. pt with tone and resistance during session with PROM. pt does not visualize hand or attempt to squeeze on command   Lower Extremity Assessment Lower Extremity Assessment: Defer to PT evaluation RLE Deficits / Details: grossly 3/5 strength but pt resisting knee flexion on command and extending, maintaining weight bearing in standing LLE Deficits / Details: grossly 3/5, able to bend knee at times on command in supine, requires blocking in standing with slight buckling, unable to fully assess due to command following   Cervical / Trunk Assessment Cervical / Trunk Assessment: Other exceptions Cervical / Trunk Exceptions: forward head   Communication Communication Communication: Expressive difficulties;HOH   Cognition Arousal/Alertness: Awake/alert Behavior During Therapy: Restless Overall Cognitive Status: Impaired/Different from baseline Area of Impairment: Orientation;Attention;Memory;Following commands;Safety/judgement;Awareness;Problem solving                 Orientation Level: Disoriented to;Time;Place Current Attention Level:  Focused Memory: Decreased short-term memory Following Commands: Follows one step commands inconsistently Safety/Judgement: Decreased awareness of safety;Decreased awareness of deficits   Problem Solving: Slow processing;Decreased initiation;Difficulty sequencing;Requires verbal cues;Requires tactile cues General Comments: pt stating he is in [place], hospital with spot on his brain. Pt stating year as 2010 but able to state correct age, states month as October, right gaze preference   General Comments  risk for skin break down    Exercises     Shoulder Instructions      Home Living Family/patient expects to be discharged to:: Private residence Living Arrangements: Spouse/significant other Available Help at Discharge: Family;Available 24 hours/day Type of Home: House Home Access: Ramped entrance     Home Layout: One level     Bathroom Shower/Tub: Walk-in shower;Tub/shower unit   Bathroom Toilet: Standard     Home Equipment: Walker - 2 wheels;Transport chair;Bedside commode   Additional Comments: 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 Pt has 4 kids who can assist        OT Problem List: Decreased strength;Decreased range of motion;Decreased activity tolerance;Impaired balance (sitting and/or standing);Impaired vision/perception;Decreased coordination;Decreased cognition;Decreased safety awareness;Decreased knowledge of use of DME or AE;Decreased knowledge of precautions;Obesity;Impaired UE functional use      OT Treatment/Interventions: Self-care/ADL training;Therapeutic exercise;Neuromuscular education;DME and/or AE instruction;Therapeutic activities;Cognitive remediation/compensation;Visual/perceptual remediation/compensation;Patient/family  education;Balance  training    OT Goals(Current goals can be found in the care plan section) Acute Rehab OT Goals Patient Stated Goal: pt was independent prior adn son expressed patient will want to be as independent as possible OT Goal Formulation: With family Time For Goal Achievement: 12/01/17 Potential to Achieve Goals: Good  OT Frequency: Min 3X/week   Barriers to D/C: Decreased caregiver support          Co-evaluation PT/OT/SLP Co-Evaluation/Treatment: Yes Reason for Co-Treatment: Complexity of the patient's impairments (multi-system involvement);Necessary to address cognition/behavior during functional activity;For patient/therapist safety;To address functional/ADL transfers   OT goals addressed during session: ADL's and self-care;Proper use of Adaptive equipment and DME;Strengthening/ROM      AM-PAC PT "6 Clicks" Daily Activity     Outcome Measure Help from another person eating meals?: Total Help from another person taking care of personal grooming?: Total Help from another person toileting, which includes using toliet, bedpan, or urinal?: Total Help from another person bathing (including washing, rinsing, drying)?: Total Help from another person to put on and taking off regular upper body clothing?: Total Help from another person to put on and taking off regular lower body clothing?: Total 6 Click Score: 6   End of Session Equipment Utilized During Treatment: Gait belt Nurse Communication: Mobility status;Precautions  Activity Tolerance: Patient tolerated treatment well Patient left: in bed;with call bell/phone within reach;with family/visitor present;with nursing/sitter in room  OT Visit Diagnosis: Unsteadiness on feet (R26.81);Cognitive communication deficit (R41.841);Hemiplegia and hemiparesis Symptoms and signs involving cognitive functions: Cerebral infarction Hemiplegia - Right/Left: Left Hemiplegia - dominant/non-dominant: Non-Dominant                Time:  7026-3785 OT Time Calculation (min): 27 min Charges:  OT General Charges $OT Visit: 1 Visit OT Evaluation $OT Eval Moderate Complexity: 1 Mod G-Codes:      Jeri Modena   OTR/L Pager: [phone] Office: [phone] .   Parke Poisson B 11/17/2017, 1:41 PM

## 2017-11-17 NOTE — Consult Note (Addendum)
Physical Medicine and Rehabilitation Consult  Reason for Consult: Functional deficits due to stroke.  Referring Physician: Dr. Erlinda Hong   HPI: Dakota [name] is a 84 y.o. male with history of HTN, CHB s/p PPM, GERD/Schatzki's ring, PAF- refused coumadin; who was admitted on 11/16/17 with left sided weakness, right gaze preference,  slurred speech and mental status changes.  CT head showed acute to subacute right basal ganglia infarct with concerns of distal R-MCA thrombus. CTA/P head done revealing R-MCA territory penumbra with moderate to severe  M3 branch irregularity. He underwent cerebral angio with revascularization of R-MCA M2 segment by Dr.Deveshwar.  Work up underway and Dr. Erlinda Hong felt that stroke embolic due to A fib. Therapy evaluations completed today and patient with significant limitations due to delirium, severe dysarthria, right gaze preference with left sided weakness. CIR recommended due to functional deficits.   Review of Systems  Unable to perform ROS: Mental acuity      Past Medical History:  Diagnosis Date  . Anxiety   . Back pain, chronic   . Complete heart block ([place]) 07/18/2014   [employer] [place] model [id number] (serial number [id number])  singe lead PPM  . GERD (gastroesophageal reflux disease) 10/2002  . Hyperlipidemia 12/1994  . Hypertension   . Hypokalemia   . Lung collapse 03/1991   Fall  . Permanent atrial fibrillation ([name])    Refused coumadin therapy    Past Surgical History:  Procedure Laterality Date  . BACK SURGERY  11/91   with hardware fixation  . CARDIAC CATHETERIZATION  06/2004   30 % stenosis, EF normal  . ESOPHAGOGASTRODUODENOSCOPY N/A 01/06/2016   Procedure: ESOPHAGOGASTRODUODENOSCOPY (EGD);  Surgeon: Otis Brace, MD;  Location: [place];  Service: Gastroenterology;  Laterality: N/A;  . EYE SURGERY     Retinal bubble surgery, cataract   . LEFT HEART CATHETERIZATION WITH CORONARY ANGIOGRAM N/A 07/18/2014   Procedure: LEFT HEART  CATHETERIZATION WITH CORONARY ANGIOGRAM;  Surgeon: Jettie Booze, MD; [facility] [place], LAD mild dz, D1 80%, D2 OK, CFX system OK, RCA OK, PDA 100%, med rx  . PERMANENT PACEMAKER INSERTION N/A 07/18/2014   Procedure: PERMANENT PACEMAKER INSERTION;  Surgeon: Thompson Grayer, MD; Medtronic [place] model [phone] (serial number [id number])    . RADIOLOGY WITH ANESTHESIA N/A 11/16/2017   Procedure: RADIOLOGY WITH ANESTHESIA;  Surgeon: Luanne Bras, MD;  Location: [place];  Service: Radiology;  Laterality: N/A;  . TEMPORARY PACEMAKER INSERTION N/A 07/18/2014   Procedure: TEMPORARY PACEMAKER INSERTION;  Surgeon: Peter M Martinique, MD;  Location: [facility] CATH LAB;  Service: Cardiovascular;  Laterality: N/A;    Family History  Problem Relation Age of Onset  . Pneumonia Mother   . Hip fracture Mother   . Cancer Father        splenic  . Heart disease Sister        Pacer placed  . Cancer Brother        throat and lung  . Cancer Brother        prostate, age 40  . Prostate cancer Brother   . Heart disease Son   . Stroke Neg Hx   . Colon cancer Neg Hx     Social History:  Married and is the caregiver for his wife who is bedbound?. Per reports that he quit smoking about 64 years ago. He has a 15.00 pack-year smoking history. His smokeless tobacco use includes chew. Per reports that he does not drink alcohol or use drugs.  Allergies  Allergen Reactions  . Warfarin And Related Other (See Comments)    Lost a lot of weight and also experienced numbness  . Atorvastatin Other (See Comments)    REACTION: MUSCLE PAIN  . Ezetimibe Other (See Comments)    REACTION: MUSCLE PAIN  . Diazepam Other (See Comments)    UNKNOWN, per pt  . Penicillins Other (See Comments)    UNKNOWN, per pt   Medications Prior to Admission  Medication Sig Dispense Refill  . ALPRAZolam (XANAX) 0.5 MG tablet TAKE ONE-HALF TABLET BY MOUTH TWICE DAILY AS NEEDED FOR ANXIETY (Patient taking differently: Take 0.25 mg by mouth 2 (two) times  daily as needed for anxiety. TAKE ONE-HALF TABLET BY MOUTH TWICE DAILY AS NEEDED FOR ANXIETY) 60 tablet 0  . aspirin 81 MG tablet Take 81 mg by mouth 2 (two) times daily.     . calcium carbonate (TUMS - DOSED IN MG ELEMENTAL CALCIUM) 500 MG chewable tablet Chew 1 tablet by mouth daily as needed for indigestion.     . carvedilol (COREG) 25 MG tablet TAKE 1 TABLET BY MOUTH TWICE DAILY WITH  A  MEAL. 180 tablet 1  . docusate sodium (COLACE) 100 MG capsule Take 1 capsule (100 mg total) by mouth daily as needed for mild constipation.    . hydrochlorothiazide (MICROZIDE) 12.5 MG capsule TAKE ONE CAPSULE BY MOUTH ONCE DAILY 90 capsule 1  . Magnesium Hydroxide (MILK OF MAGNESIA PO) Take 1 tablet by mouth daily as needed.    . nitroGLYCERIN (NITROSTAT) 0.4 MG SL tablet Place 1 tablet (0.4 mg total) under the tongue every 5 (five) minutes as needed for chest pain. 25 tablet 3  . pantoprazole (PROTONIX) 40 MG tablet Take 1 tablet (40 mg total) by mouth daily. 30 tablet 0  . ranitidine (ZANTAC) 150 MG tablet TAKE 1 TABLET BY MOUTH ONCE DAILY 90 tablet 1    Home: Home Living Family/patient expects to be discharged to:: Private residence Living Arrangements: Spouse/significant other Available Help at Discharge: Family, Available 24 hours/day Type of Home: House Home Access: Ramped entrance Home Layout: One level Bathroom Shower/Tub: Gaffer, Chiropodist: Standard Home Equipment: Environmental consultant - 2 wheels, Transport chair, Bedside commode Additional Comments: pt was primary caregiver for wife who does not walk, pt would transfer her and family assists with bathing  Functional History: Prior Function Level of Independence: Independent Comments: very independent, 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 Pt has 4 kids who can assist Functional Status:  Mobility: Bed Mobility Overal bed mobility: Needs Assistance Bed Mobility: Rolling, Sidelying to Sit Rolling: Max assist, +2 for  safety/equipment Sidelying to sit: Max assist, +2 for physical assistance, HOB elevated General bed mobility comments: max assist to bend left knee, move LUE onto chest and roll to right, pt assisting with RUE. Assist to elevate trunk and bring legs off of bed Transfers Overall transfer level: Needs assistance Transfers: Sit to/from Stand, Stand Pivot Transfers Sit to Stand: Mod assist, +2 physical assistance Stand pivot transfers: Max assist, +2 physical assistance General transfer comment: pt stood from bed and chair with left knee blocked and pt activating bil quad and rising with cues to initiate and assist at belt and trunk for stability. Pt able to pivot to right from bed<>chair with assist for balance, stepping and sequence. Pt with blocking left knee and facilitation to step and extend trunk.  Ambulation/Gait General Gait Details: unable    ADL: ADL Overall ADL's : Needs assistance/impaired Eating/Feeding:  NPO Eating/Feeding Details (indicate cue type and reason): dentures removed. pt with only bottom plate and son uncertain where the top plate is located Upper Body Bathing: Maximal assistance Lower Body Bathing: Total assistance Upper Body Dressing : Maximal assistance Lower Body Dressing: Total assistance Toilet Transfer: +2 for physical assistance, Maximal assistance Toilet Transfer Details (indicate cue type and reason): requires (A) to shift weight toward chair and bd General ADL Comments: simulated OOB to chair. Pt positioned in chair and RN arriving to place patient back in bed for MRI. pt transfered back to bed surface. Encouragement for staff to get patient OOB for all meal  Cognition: Cognition Overall Cognitive Status: Impaired/Different from baseline Arousal/Alertness: Awake/alert Orientation Level: Oriented to person, Disoriented to situation, Disoriented to time, Disoriented to place Attention: Focused, Sustained Focused Attention: Appears intact Sustained  Attention: Impaired Memory: Impaired Memory Impairment: Decreased short term memory Decreased Short Term Memory: Verbal basic Awareness: Impaired Awareness Impairment: Intellectual impairment Problem Solving: Impaired Problem Solving Impairment: Verbal basic, Functional basic Behaviors: Restless Safety/Judgment: Impaired Cognition Arousal/Alertness: Awake/alert Behavior During Therapy: Restless Overall Cognitive Status: Impaired/Different from baseline Area of Impairment: Orientation, Attention, Memory, Following commands, Safety/judgement, Awareness, Problem solving Orientation Level: Disoriented to, Time, Place Current Attention Level: Focused Memory: Decreased short-term memory Following Commands: Follows one step commands inconsistently Safety/Judgement: Decreased awareness of safety, Decreased awareness of deficits Problem Solving: Slow processing, Decreased initiation, Difficulty sequencing, Requires verbal cues, Requires tactile cues General Comments: pt stating he is in [place], hospital with spot on his brain. Pt stating year as 2010 but able to state correct age, states month as October, right gaze preference  Blood pressure 123/66, pulse 78, temperature 99.2 F (37.3 C), resp. rate 20, height 5\' 8"  (1.727 m), weight 87.5 kg (192 lb 14.4 oz), SpO2 100 %. Physical Exam  Constitutional: No distress.  HENT:  Head: Normocephalic.  Eyes: Pupils are equal, round, and reactive to light.  Neck: Normal range of motion.  Cardiovascular:  IRR IRR  Respiratory: Effort normal.  Neurological:  Confused. Alert to name. Can follow some simple commands. Left inattention and left hemiparesis. Minimal reaction to pain left arm or leg.   Skin: Skin is warm.    Results for orders placed or performed during the hospital encounter of 11/16/17 (from the past 24 hour(s))  Glucose, capillary     Status: Abnormal   Collection Time: 11/16/17  6:25 PM  Result Value Ref Range    Glucose-Capillary 173 (H) 70 - 99 mg/dL  MRSA PCR Screening     Status: None   Collection Time: 11/16/17  6:59 PM  Result Value Ref Range   MRSA by PCR NEGATIVE NEGATIVE  Hemoglobin A1c     Status: Abnormal   Collection Time: 11/17/17  5:00 AM  Result Value Ref Range   Hgb A1c MFr Bld 6.1 (H) 4.8 - 5.6 %   Mean Plasma Glucose 128.37 mg/dL  Lipid panel     Status: Abnormal   Collection Time: 11/17/17  5:00 AM  Result Value Ref Range   Cholesterol 151 0 - 200 mg/dL   Triglycerides 61 <150 mg/dL   HDL 37 (L) >40 mg/dL   Total CHOL/HDL Ratio 4.1 RATIO   VLDL 12 0 - 40 mg/dL   LDL Cholesterol 102 (H) 0 - 99 mg/dL  CBC with Differential/Platelet     Status: Abnormal   Collection Time: 11/17/17  5:00 AM  Result Value Ref Range   WBC 15.1 (H) 4.0 - 10.5 K/uL  RBC 4.90 4.22 - 5.81 MIL/uL   Hemoglobin 14.5 13.0 - 17.0 g/dL   HCT 44.7 39.0 - 52.0 %   MCV 91.2 78.0 - 100.0 fL   MCH 29.6 26.0 - 34.0 pg   MCHC 32.4 30.0 - 36.0 g/dL   RDW 13.2 11.5 - 15.5 %   Platelets 196 150 - 400 K/uL   Neutrophils Relative % 84 %   Neutro Abs 12.8 (H) 1.7 - 7.7 K/uL   Lymphocytes Relative 10 %   Lymphs Abs 1.5 0.7 - 4.0 K/uL   Monocytes Relative 5 %   Monocytes Absolute 0.7 0.1 - 1.0 K/uL   Eosinophils Relative 0 %   Eosinophils Absolute 0.0 0.0 - 0.7 K/uL   Basophils Relative 0 %   Basophils Absolute 0.0 0.0 - 0.1 K/uL   Immature Granulocytes 1 %   Abs Immature Granulocytes 0.1 0.0 - 0.1 K/uL  Basic metabolic panel     Status: Abnormal   Collection Time: 11/17/17  5:00 AM  Result Value Ref Range   Sodium 139 135 - 145 mmol/L   Potassium 4.4 3.5 - 5.1 mmol/L   Chloride 109 98 - 111 mmol/L   CO2 22 22 - 32 mmol/L   Glucose, Bld 184 (H) 70 - 99 mg/dL   BUN 24 (H) 8 - 23 mg/dL   Creatinine, Ser 1.26 (H) 0.61 - 1.24 mg/dL   Calcium 8.5 (L) 8.9 - 10.3 mg/dL   GFR calc non Af Amer 50 (L) >60 mL/min   GFR calc Af Amer 58 (L) >60 mL/min   Anion gap 8 5 - 15   Ct Angio Head W Or Wo  Contrast  Result Date: 11/16/2017 CLINICAL DATA:  84 year old male code stroke. Altered mental status. Evidence of right basal ganglia/MCA lacunar and possible hyperdense right MCA on noncontrast head CT 1309 hours today. EXAM: CT ANGIOGRAPHY HEAD AND NECK CT PERFUSION BRAIN TECHNIQUE: Multidetector CT imaging of the head and neck was performed using the standard protocol during bolus administration of intravenous contrast. Multiplanar CT image reconstructions and MIPs were obtained to evaluate the vascular anatomy. Carotid stenosis measurements (when applicable) are obtained utilizing NASCET criteria, using the distal internal carotid diameter as the denominator. Multiphase CT imaging of the brain was performed following IV bolus contrast injection. Subsequent parametric perfusion maps were calculated using RAPID software. CONTRAST:  1mL ISOVUE-370 IOPAMIDOL (ISOVUE-370) INJECTION 76% COMPARISON:  Head CT without contrast 1309 hours today. FINDINGS: CT Brain Perfusion Findings: ASPECTS approximately 7 on the earlier noncontrast CT. CBF (<30%) Volume: 0 milliliters (CBF less than 38% the techs 5 milliliters along the posterior operculum). Perfusion (Tmax>6.0s) volume: 41 milliliters Mismatch Volume: 41 milliliters Infarction Location:Posterior right MCA territory. Other findings:  Some motion artifact noted CTA NECK Skeleton: Absent dentition. No acute osseous abnormality identified. Upper chest: Left chest cardiac pacemaker type device. Mild dependent pulmonary atelectasis. No superior mediastinal lymphadenopathy. Other neck: Negative.  No neck mass or lymphadenopathy. Aortic arch: 3 vessel arch configuration. Mild to moderate arch atherosclerosis. Right carotid system: No brachiocephalic or right CCA origin stenosis. Minimal right CCA plaque including at the right carotid bifurcation. Mild soft plaque at the right ICA origin. No cervical right ICA stenosis. Left carotid system: No left CCA origin stenosis.  Minimal left CCA plaque including at the left carotid bifurcation. Mild proximal left ICA plaque with no stenosis. Vertebral arteries: No proximal right subclavian artery stenosis. Normal right vertebral artery origin. Patent right vertebral artery to the skull base without stenosis.  No proximal left subclavian artery stenosis despite mild plaque. Soft and calcified plaque at the left vertebral artery origin and proximal V1 segment (series 10, image 29), with only mild stenosis. Codominant vertebral arteries, the left is patent to the skull base with mild V3 segment plaque but no stenosis. CTA HEAD Posterior circulation: Codominant distal vertebral arteries are patent without stenosis. Patent PICA origins and vertebrobasilar junction. Patent basilar artery without stenosis. Normal SCA and left PCA origin. Tortuous right P1 segment and right posterior communicating artery. The left Posterior communicating artery is diminutive or absent. Bilateral PCA branches are within normal limits. Anterior circulation: Both ICA siphons are patent. There is only minor siphon calcified plaque, greater on the left. Normal ophthalmic and right posterior communicating artery origins. Patent carotid termini. Normal MCA and ACA origins. Mildly tortuous A1 segments. Anterior communicating artery and bilateral ACA branches are within normal limits. Left MCA M1 segment is tortuous. Left MCA bifurcation is patent without stenosis. Left MCA branches are within normal limits. Right MCA M1 segment is tortuous but patent without stenosis. Right MCA bifurcation is patent. No right MCA M2 branch occlusion identified. Areas of moderate to severe right M3 branch irregularity are noted on series 14, image 15. Venous sinuses: Patent. Anatomic variants: None. Review of the MIP images confirms the above findings IMPRESSION: 1. Negative for large vessel occlusion. No discrete right MCA branch occlusion is identified, but moderate to severe right M3  branch irregularity is noted. 2. CT perfusion suggests posterior right MCA territory penumbra, but does not detect core infarct with the standard parameters. However, a small 5 mL infarct is suggested with more sensitive CBF (<38%) parameter, located along the posterior operculum. Therefore, it is possible that the right lentiform finding is chronic. 3. Mild for age atherosclerosis in the head and neck with no hemodynamically significant arterial stenosis identified outside of the right M3 findings in #1. 4.  Study discussed by telephone on 11/16/2017 at 15:19 to Dr. Rory Percy. Electronically Signed   By: Genevie Ann M.D.   On: 11/16/2017 15:20   Ct Head Wo Contrast  Result Date: 11/16/2017 CLINICAL DATA:  Altered mental status. EXAM: CT HEAD WITHOUT CONTRAST TECHNIQUE: Contiguous axial images were obtained from the base of the skull through the vertex without intravenous contrast. COMPARISON:  None. FINDINGS: Brain: Hypodensity within the right basal ganglia, external capsule, and posterior operculum. No evidence of hemorrhage, hydrocephalus, extra-axial collection or mass lesion/mass effect. Moderate age related cerebral atrophy. Scattered moderate periventricular and subcortical white matter hypodensities are nonspecific, but favored to reflect chronic microvascular ischemic changes. Vascular: Right distal MCA branch hyperdensity near the operculum likely reflecting thrombus (series 5, image 46). Atherosclerotic vascular calcification of the carotid siphons. Skull: Negative for fracture or focal lesion. Sinuses/Orbits: No acute finding. Other: None. IMPRESSION: 1. Acute to subacute infarct in the right basal ganglia, external capsule, and posterior operculum. Right distal MCA branch hyperdensity near the operculum likely reflects thrombus. These results were called by telephone at the time of interpretation on 11/16/2017 at 1:47 pm to Dr. Carmin Muskrat , who verbally acknowledged these results. Electronically Signed    By: Titus Dubin M.D.   On: 11/16/2017 13:49   Ct Angio Neck W Or Wo Contrast  Result Date: 11/16/2017 CLINICAL DATA:  84 year old male code stroke. Altered mental status. Evidence of right basal ganglia/MCA lacunar and possible hyperdense right MCA on noncontrast head CT 1309 hours today. EXAM: CT ANGIOGRAPHY HEAD AND NECK CT PERFUSION BRAIN TECHNIQUE: Multidetector CT imaging  of the head and neck was performed using the standard protocol during bolus administration of intravenous contrast. Multiplanar CT image reconstructions and MIPs were obtained to evaluate the vascular anatomy. Carotid stenosis measurements (when applicable) are obtained utilizing NASCET criteria, using the distal internal carotid diameter as the denominator. Multiphase CT imaging of the brain was performed following IV bolus contrast injection. Subsequent parametric perfusion maps were calculated using RAPID software. CONTRAST:  83mL ISOVUE-370 IOPAMIDOL (ISOVUE-370) INJECTION 76% COMPARISON:  Head CT without contrast 1309 hours today. FINDINGS: CT Brain Perfusion Findings: ASPECTS approximately 7 on the earlier noncontrast CT. CBF (<30%) Volume: 0 milliliters (CBF less than 38% the techs 5 milliliters along the posterior operculum). Perfusion (Tmax>6.0s) volume: 41 milliliters Mismatch Volume: 41 milliliters Infarction Location:Posterior right MCA territory. Other findings:  Some motion artifact noted CTA NECK Skeleton: Absent dentition. No acute osseous abnormality identified. Upper chest: Left chest cardiac pacemaker type device. Mild dependent pulmonary atelectasis. No superior mediastinal lymphadenopathy. Other neck: Negative.  No neck mass or lymphadenopathy. Aortic arch: 3 vessel arch configuration. Mild to moderate arch atherosclerosis. Right carotid system: No brachiocephalic or right CCA origin stenosis. Minimal right CCA plaque including at the right carotid bifurcation. Mild soft plaque at the right ICA origin. No cervical  right ICA stenosis. Left carotid system: No left CCA origin stenosis. Minimal left CCA plaque including at the left carotid bifurcation. Mild proximal left ICA plaque with no stenosis. Vertebral arteries: No proximal right subclavian artery stenosis. Normal right vertebral artery origin. Patent right vertebral artery to the skull base without stenosis. No proximal left subclavian artery stenosis despite mild plaque. Soft and calcified plaque at the left vertebral artery origin and proximal V1 segment (series 10, image 29), with only mild stenosis. Codominant vertebral arteries, the left is patent to the skull base with mild V3 segment plaque but no stenosis. CTA HEAD Posterior circulation: Codominant distal vertebral arteries are patent without stenosis. Patent PICA origins and vertebrobasilar junction. Patent basilar artery without stenosis. Normal SCA and left PCA origin. Tortuous right P1 segment and right posterior communicating artery. The left Posterior communicating artery is diminutive or absent. Bilateral PCA branches are within normal limits. Anterior circulation: Both ICA siphons are patent. There is only minor siphon calcified plaque, greater on the left. Normal ophthalmic and right posterior communicating artery origins. Patent carotid termini. Normal MCA and ACA origins. Mildly tortuous A1 segments. Anterior communicating artery and bilateral ACA branches are within normal limits. Left MCA M1 segment is tortuous. Left MCA bifurcation is patent without stenosis. Left MCA branches are within normal limits. Right MCA M1 segment is tortuous but patent without stenosis. Right MCA bifurcation is patent. No right MCA M2 branch occlusion identified. Areas of moderate to severe right M3 branch irregularity are noted on series 14, image 15. Venous sinuses: Patent. Anatomic variants: None. Review of the MIP images confirms the above findings IMPRESSION: 1. Negative for large vessel occlusion. No discrete right  MCA branch occlusion is identified, but moderate to severe right M3 branch irregularity is noted. 2. CT perfusion suggests posterior right MCA territory penumbra, but does not detect core infarct with the standard parameters. However, a small 5 mL infarct is suggested with more sensitive CBF (<38%) parameter, located along the posterior operculum. Therefore, it is possible that the right lentiform finding is chronic. 3. Mild for age atherosclerosis in the head and neck with no hemodynamically significant arterial stenosis identified outside of the right M3 findings in #1. 4.  Study discussed by telephone  on 11/16/2017 at 15:19 to Dr. Rory Percy. Electronically Signed   By: Genevie Ann M.D.   On: 11/16/2017 15:20   Ct Cerebral Perfusion W Contrast  Result Date: 11/16/2017 CLINICAL DATA:  84 year old male code stroke. Altered mental status. Evidence of right basal ganglia/MCA lacunar and possible hyperdense right MCA on noncontrast head CT 1309 hours today. EXAM: CT ANGIOGRAPHY HEAD AND NECK CT PERFUSION BRAIN TECHNIQUE: Multidetector CT imaging of the head and neck was performed using the standard protocol during bolus administration of intravenous contrast. Multiplanar CT image reconstructions and MIPs were obtained to evaluate the vascular anatomy. Carotid stenosis measurements (when applicable) are obtained utilizing NASCET criteria, using the distal internal carotid diameter as the denominator. Multiphase CT imaging of the brain was performed following IV bolus contrast injection. Subsequent parametric perfusion maps were calculated using RAPID software. CONTRAST:  9mL ISOVUE-370 IOPAMIDOL (ISOVUE-370) INJECTION 76% COMPARISON:  Head CT without contrast 1309 hours today. FINDINGS: CT Brain Perfusion Findings: ASPECTS approximately 7 on the earlier noncontrast CT. CBF (<30%) Volume: 0 milliliters (CBF less than 38% the techs 5 milliliters along the posterior operculum). Perfusion (Tmax>6.0s) volume: 41 milliliters  Mismatch Volume: 41 milliliters Infarction Location:Posterior right MCA territory. Other findings:  Some motion artifact noted CTA NECK Skeleton: Absent dentition. No acute osseous abnormality identified. Upper chest: Left chest cardiac pacemaker type device. Mild dependent pulmonary atelectasis. No superior mediastinal lymphadenopathy. Other neck: Negative.  No neck mass or lymphadenopathy. Aortic arch: 3 vessel arch configuration. Mild to moderate arch atherosclerosis. Right carotid system: No brachiocephalic or right CCA origin stenosis. Minimal right CCA plaque including at the right carotid bifurcation. Mild soft plaque at the right ICA origin. No cervical right ICA stenosis. Left carotid system: No left CCA origin stenosis. Minimal left CCA plaque including at the left carotid bifurcation. Mild proximal left ICA plaque with no stenosis. Vertebral arteries: No proximal right subclavian artery stenosis. Normal right vertebral artery origin. Patent right vertebral artery to the skull base without stenosis. No proximal left subclavian artery stenosis despite mild plaque. Soft and calcified plaque at the left vertebral artery origin and proximal V1 segment (series 10, image 29), with only mild stenosis. Codominant vertebral arteries, the left is patent to the skull base with mild V3 segment plaque but no stenosis. CTA HEAD Posterior circulation: Codominant distal vertebral arteries are patent without stenosis. Patent PICA origins and vertebrobasilar junction. Patent basilar artery without stenosis. Normal SCA and left PCA origin. Tortuous right P1 segment and right posterior communicating artery. The left Posterior communicating artery is diminutive or absent. Bilateral PCA branches are within normal limits. Anterior circulation: Both ICA siphons are patent. There is only minor siphon calcified plaque, greater on the left. Normal ophthalmic and right posterior communicating artery origins. Patent carotid termini.  Normal MCA and ACA origins. Mildly tortuous A1 segments. Anterior communicating artery and bilateral ACA branches are within normal limits. Left MCA M1 segment is tortuous. Left MCA bifurcation is patent without stenosis. Left MCA branches are within normal limits. Right MCA M1 segment is tortuous but patent without stenosis. Right MCA bifurcation is patent. No right MCA M2 branch occlusion identified. Areas of moderate to severe right M3 branch irregularity are noted on series 14, image 15. Venous sinuses: Patent. Anatomic variants: None. Review of the MIP images confirms the above findings IMPRESSION: 1. Negative for large vessel occlusion. No discrete right MCA branch occlusion is identified, but moderate to severe right M3 branch irregularity is noted. 2. CT perfusion suggests posterior right MCA  territory penumbra, but does not detect core infarct with the standard parameters. However, a small 5 mL infarct is suggested with more sensitive CBF (<38%) parameter, located along the posterior operculum. Therefore, it is possible that the right lentiform finding is chronic. 3. Mild for age atherosclerosis in the head and neck with no hemodynamically significant arterial stenosis identified outside of the right M3 findings in #1. 4.  Study discussed by telephone on 11/16/2017 at 15:19 to Dr. Rory Percy. Electronically Signed   By: Genevie Ann M.D.   On: 11/16/2017 15:20   Dg Chest Port 1 View  Result Date: 11/16/2017 CLINICAL DATA:  Encounter for AMS and SOB EXAM: PORTABLE CHEST - 1 VIEW COMPARISON:  01/06/2016 FINDINGS: Relatively low lung volumes with crowding of bronchovascular structures. No confluent airspace infiltrate or overt edema. Heart size upper limits normal for technique. No effusion or pneumothorax. Stable left subclavian single lead transvenous pacemaker extending towards the right ventricular apex. Lumbar fixation hardware partially visualized. IMPRESSION: No acute cardiopulmonary disease. Electronically  Signed   By: Lucrezia Europe M.D.   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 Does the need for close, 24 hr/day medical supervision in concert with the patient's rehab needs make it unreasonable for this patient to be served in a less intensive setting? Yes 2. Co-Morbidities requiring supervision/potential complications: afib, HTN, 3. Due to bladder management, bowel management, safety, skin/wound care, disease management, medication administration, pain management and patient education, does the patient require 24 hr/day rehab nursing? Yes 4. Does the patient require coordinated care of a physician, rehab nurse, PT (1-2 hrs/day, 5 days/week), OT (1-2 hrs/day, 5 days/week) and SLP (1-2 hrs/day, 5 days/week) to address physical and functional deficits in the context of the above medical diagnosis(es)? Yes Addressing deficits in the following areas: balance, endurance, locomotion, strength, transferring, bowel/bladder control, bathing, dressing, feeding, grooming, toileting, cognition, swallowing and psychosocial support 5. Can the patient actively participate in an intensive therapy program of at least 3 hrs of therapy per day at least 5 days per week? Yes 6. The potential for patient to make measurable gains while on inpatient rehab is good 7. Anticipated functional outcomes upon discharge from inpatient rehab are supervision and min assist  with PT, supervision and min assist with OT, supervision with SLP. 8. Estimated rehab length of stay to reach the above functional goals is: 19-24 days 9. Anticipated D/C setting: Home 10. Anticipated post D/C treatments: [place] therapy 11. Overall Rehab/Functional Prognosis: good  RECOMMENDATIONS: This patient's condition is appropriate for continued rehabilitative care in the following setting: CIR Patient has agreed to participate in recommended program. N/A Note that insurance prior  authorization may be required for reimbursement for recommended care.  Comment: Rehab Admissions Coordinator to follow up.  Thanks,  Meredith Staggers, MD, Mellody Drown  I have personally performed a face to face diagnostic evaluation of this patient. Additionally, I have reviewed and concur with the physician assistant's documentation above.    Bary Leriche, PA-C 11/17/2017

## 2017-11-17 NOTE — Progress Notes (Signed)
  Echocardiogram 2D Echocardiogram has been performed.  Merrie Roof F 11/17/2017, 3:07 PM

## 2017-11-17 NOTE — Code Documentation (Signed)
[name] coming from home with complaints of some slurred speech and AMS that started this morning. When EMS inquired with the family, son reported that he had last seen his dad normal on 11/15/2017 at 52. Pt arrived to ED and believed to be out of the window for any type of stroke intervention. Pt was drosy upon arrival, and ED staff reported awareness increased as time passed. Neurology was consulted. Neurology requested CTA/CTP. Once completed, showed an occlusion with some penumbra per CTP. Neurologist called Code Stroke and IR was activated. Pt taken directly to IR suite. IR team met patient upon arrival. Handoff given to IR Staff.

## 2017-11-17 NOTE — Evaluation (Signed)
Physical Therapy Evaluation Patient Details Name: Dakota [name] MRN: [id number] DOB: 27-Jan-1929 Today's Date: 11/17/2017   History of Present Illness  84 yo admitted with left weakness and right gaze preference with right basal ganglia infarct s/p revascularization. 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, atrial fibrillation  Clinical Impression  Pt lethargic on arrival with ability to awaken with stimulation. Pt speaking but with garbled speech at times. Pt disoriented to time and place but aware of situation and attempting to following commands. Pt with right gaze preference able to achieve midline with cues and direction but not significantly crossing midline or maintaining. Son present and educated for positioning self near feet/midline to increase attention to left. Pt with decreased LUE and LLE function with decreased cognition, balance and function who will benefit from acute therapy to maximize mobility, function gait and safety to decrease burden of care. 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   SpO2 93% on RA     Follow Up Recommendations CIR;Supervision/Assistance - 24 hour    Equipment Recommendations  Other (comment)(TBD)    Recommendations for Other Services       Precautions / Restrictions Precautions Precautions: Fall Precaution Comments: right gaze, left hemiparesis      Mobility  Bed Mobility Overal bed mobility: Needs Assistance Bed Mobility: Rolling;Sidelying to Sit Rolling: Max assist;+2 for safety/equipment Sidelying to sit: Max assist;+2 for physical assistance;HOB elevated       General bed mobility comments: max assist to bend left knee, move LUE onto chest and roll to right, pt assisting with RUE. Assist to elevate trunk and bring legs off of bed  Transfers Overall transfer level: Needs assistance   Transfers: Sit to/from  Stand;Stand Pivot Transfers Sit to Stand: Mod assist;+2 physical assistance Stand pivot transfers: Max assist;+2 physical assistance       General transfer comment: pt stood from bed and chair with left knee blocked and pt activating bil quad and rising with cues to initiate and assist at belt and trunk for stability. Pt able to pivot to right from bed<>chair with assist for balance, stepping and sequence. Pt with blocking left knee and facilitation to step and extend trunk.   Ambulation/Gait             General Gait Details: unable  Stairs            Wheelchair Mobility    Modified Rankin (Stroke Patients Only) Modified Rankin (Stroke Patients Only) Pre-Morbid Rankin Score: No symptoms Modified Rankin: Severe disability     Balance Overall balance assessment: Needs assistance Sitting-balance support: Single extremity supported;Feet supported Sitting balance-Leahy Scale: Zero Sitting balance - Comments: pt with initial posterior lean with transition to left lean and progression from max to mod assist with sitting grossly 6 min EOB. With cues pt able to reach to right and lean right Postural control: Posterior lean;Left lateral lean Standing balance support: Single extremity supported Standing balance-Leahy Scale: Poor Standing balance comment: bil UE support in standing and assist for balance and stability                              Pertinent Vitals/Pain Pain Assessment: No/denies pain    Home Living Family/patient expects to be discharged to:: Private residence Living Arrangements: Spouse/significant other Available Help at Discharge: Family;Available 24 hours/day Type of Home:  House Home Access: Ramped entrance     Home Layout: One level Home Equipment: Walker - 2 wheels;Transport chair;Bedside commode Additional Comments: 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 Pt has 4 kids who can assist     Hand Dominance   Dominant Hand: Right    Extremity/Trunk Assessment   Upper Extremity Assessment Upper Extremity Assessment: Defer to OT evaluation    Lower Extremity Assessment Lower Extremity Assessment: RLE deficits/detail;LLE deficits/detail RLE Deficits / Details: grossly 3/5 strength but pt resisting knee flexion on command and extending, maintaining weight bearing in standing LLE Deficits / Details: grossly 3/5, able to bend knee at times on command in supine, requires blocking in standing with slight buckling, unable to fully assess due to command following    Cervical / Trunk Assessment Cervical / Trunk Assessment: Other exceptions Cervical / Trunk Exceptions: forward head  Communication   Communication: Expressive difficulties  Cognition Arousal/Alertness: Awake/alert Behavior During Therapy: Restless Overall Cognitive Status: Impaired/Different from baseline Area of Impairment: Orientation;Attention;Memory;Following commands;Safety/judgement;Awareness;Problem solving                 Orientation Level: Disoriented to;Time;Place Current Attention Level: Focused Memory: Decreased short-term memory Following Commands: Follows one step commands inconsistently Safety/Judgement: Decreased awareness of safety;Decreased awareness of deficits   Problem Solving: Slow processing;Decreased initiation;Difficulty sequencing;Requires verbal cues;Requires tactile cues General Comments: pt stating he is in [place], hospital with spot on his brain. Pt stating year as 2010 but able to state correct age, states month as October, right gaze preference      General Comments      Exercises     Assessment/Plan    PT Assessment Patient needs continued PT services  PT Problem List Decreased strength;Decreased  mobility;Decreased safety awareness;Decreased range of motion;Decreased coordination;Decreased activity tolerance;Decreased cognition;Decreased balance;Decreased knowledge of use of DME;Impaired sensation       PT Treatment Interventions Gait training;Therapeutic exercise;Patient/family education;Balance training;Neuromuscular re-education;Functional mobility training;DME instruction;Therapeutic activities;Cognitive remediation    PT Goals (Current goals can be found in the Care Plan section)  Acute Rehab PT Goals Patient Stated Goal: be more mobile and walking PT Goal Formulation: With family Time For Goal Achievement: 12/01/17 Potential to Achieve Goals: Good    Frequency Min 4X/week   Barriers to discharge   son present it retired and states family can arrange assistance    Co-evaluation               AM-PAC PT "6 Clicks" Daily Activity  Outcome Measure Difficulty turning over in bed (including adjusting bedclothes, sheets and blankets)?: Unable Difficulty moving from lying on back to sitting on the side of the bed? : Unable Difficulty sitting down on and standing up from a chair with arms (e.g., wheelchair, bedside commode, etc,.)?: Unable Help needed moving to and from a bed to chair (including a wheelchair)?: A Lot Help needed walking in hospital room?: Total Help needed climbing 3-5 steps with a railing? : Total 6 Click Score: 7    End of Session Equipment Utilized During Treatment: Gait belt Activity Tolerance: Patient tolerated treatment well Patient left: in bed;with call bell/phone within reach;with family/visitor present(return to bed for MRI) Nurse Communication: Mobility status PT Visit Diagnosis: Other abnormalities of gait and mobility (R26.89);Other symptoms and signs involving the  nervous system (R29.898);Hemiplegia and hemiparesis Hemiplegia - Right/Left: Left Hemiplegia - dominant/non-dominant: Non-dominant Hemiplegia - caused by: Cerebral infarction     Time: 7142-3200 PT Time Calculation (min) (ACUTE ONLY): 33 min   Charges:   PT Evaluation $PT Eval Moderate Complexity: 1 Mod     PT G Codes:        Elwyn Reach, PT [phone]   Benton B Nyella Eckels 11/17/2017, 12:20 PM

## 2017-11-17 NOTE — Evaluation (Signed)
Clinical/Bedside Swallow Evaluation Patient Details  Name: Dakota [name] MRN: [id number] Date of Birth: 07-17-1928  Today's Date: 11/17/2017 Time: SLP Start Time (ACUTE ONLY): 1125 SLP Stop Time (ACUTE ONLY): 1150 SLP Time Calculation (min) (ACUTE ONLY): 25 min  Past Medical History:  Past Medical History:  Diagnosis Date  . Anxiety   . Back pain, chronic   . Complete heart block ([place]) 07/18/2014   Medtronic [place] model [id number] (serial number [id number])  singe lead PPM  . GERD (gastroesophageal reflux disease) 10/2002  . Hyperlipidemia 12/1994  . Hypertension   . Hypokalemia   . Lung collapse 03/1991   Fall  . Permanent atrial fibrillation ([name])    Refused coumadin therapy   Past Surgical History:  Past Surgical History:  Procedure Laterality Date  . BACK SURGERY  11/91   with hardware fixation  . CARDIAC CATHETERIZATION  06/2004   30 % stenosis, EF normal  . ESOPHAGOGASTRODUODENOSCOPY N/A 01/06/2016   Procedure: ESOPHAGOGASTRODUODENOSCOPY (EGD);  Surgeon: Otis Brace, MD;  Location: [facility];  Service: Gastroenterology;  Laterality: N/A;  . EYE SURGERY     Retinal bubble surgery, cataract   . LEFT HEART CATHETERIZATION WITH CORONARY ANGIOGRAM N/A 07/18/2014   Procedure: LEFT HEART CATHETERIZATION WITH CORONARY ANGIOGRAM;  Surgeon: Jettie Booze, MD; [facility] [place], LAD mild dz, D1 80%, D2 OK, CFX system OK, RCA OK, PDA 100%, med rx  . PERMANENT PACEMAKER INSERTION N/A 07/18/2014   Procedure: PERMANENT PACEMAKER INSERTION;  Surgeon: Thompson Grayer, MD; Medtronic [name] model [phone] (serial number [id number])    . RADIOLOGY WITH ANESTHESIA N/A 11/16/2017   Procedure: RADIOLOGY WITH ANESTHESIA;  Surgeon: Luanne Bras, MD;  Location: [place];  Service: Radiology;  Laterality: N/A;  . TEMPORARY PACEMAKER INSERTION N/A 07/18/2014   Procedure: TEMPORARY PACEMAKER INSERTION;  Surgeon: Peter M Martinique, MD;  Location: [facility] CATH LAB;  Service: Cardiovascular;  Laterality: N/A;    HPI:  [name] is a 84 y.o. male with history of permanent atrial fibrillation (not anticoagulated ), status post Medtronic permanent pacemaker for complete heart block, hypertension, hyperlipidemia, chronic back pain, and anxiety presenting with left-sided weakness and right gaze preference. He did not receive IV t-PA due to late presentation. S/P thrombectomy Dr Estanislado Pandy. CT head - Acute to subacute infarct in the right basal ganglia, external capsule, and posterior operculum. Right distal MCA branch hyperdensity near the operculum likely reflects thrombus.    Assessment / Plan / Recommendation Clinical Impression  Pt demosntrates signs of a moderate oral dysphagia due to left CN VII and XII weakness; pt is aware of PO and achieves oral transit, though it is quite slow and likely contributing to penetration/aspiration suspected to occur before the swallow. Trials of honey thick liquids and puree were tolerated very well with no wet vocal quality or coughing over 8 oz total. Recommend pt initiate modified diet and f/u with SLP with objective testing as needed for least restrictive method.  SLP Visit Diagnosis: Dysphagia, oropharyngeal phase (R13.12)    Aspiration Risk  Moderate aspiration risk    Diet Recommendation Dysphagia 1 (Puree);Honey-thick liquid   Liquid Administration via: Cup;Straw Medication Administration: Crushed with puree Supervision: Staff to assist with self feeding;Full supervision/cueing for compensatory strategies Compensations: Slow rate;Small sips/bites Postural Changes: Seated upright at 90 degrees    Other  Recommendations     Follow up Recommendations Skilled Nursing facility      Frequency and Duration min 2x/week  2 weeks       Prognosis  Prognosis for Safe Diet Advancement: Good Barriers to Reach Goals: Cognitive deficits      Swallow Study   General HPI: [name] is a 84 y.o. male with history of permanent atrial fibrillation  (not anticoagulated ), status post Medtronic permanent pacemaker for complete heart block, hypertension, hyperlipidemia, chronic back pain, and anxiety presenting with left-sided weakness and right gaze preference. He did not receive IV t-PA due to late presentation. S/P thrombectomy Dr Estanislado Pandy. CT head - Acute to subacute infarct in the right basal ganglia, external capsule, and posterior operculum. Right distal MCA branch hyperdensity near the operculum likely reflects thrombus.  Type of Study: Bedside Swallow Evaluation Previous Swallow Assessment: none Diet Prior to this Study: NPO Temperature Spikes Noted: No Respiratory Status: Room air History of Recent Intubation: No Behavior/Cognition: Alert;Cooperative;Distractible;Requires cueing Oral Cavity Assessment: Dry Oral Care Completed by SLP: Yes Oral Cavity - Dentition: Missing dentition Vision: Impaired for self-feeding Self-Feeding Abilities: Needs assist Patient Positioning: Upright in bed Baseline Vocal Quality: Normal Volitional Cough: Strong Volitional Swallow: Able to elicit    Oral/Motor/Sensory Function Overall Oral Motor/Sensory Function: Moderate impairment Facial ROM: Reduced left;Suspected CN VII (facial) dysfunction Facial Symmetry: Abnormal symmetry left;Suspected CN VII (facial) dysfunction Facial Strength: Reduced left;Suspected CN VII (facial) dysfunction Facial Sensation: Reduced left;Suspected CN V (Trigeminal) dysfunction Lingual ROM: Reduced left;Suspected CN XII (hypoglossal) dysfunction Lingual Symmetry: Abnormal symmetry left;Suspected CN XII (hypoglossal) dysfunction Lingual Strength: Suspected CN XII (hypoglossal) dysfunction   Ice Chips Ice chips: Impaired Presentation: Spoon Pharyngeal Phase Impairments: Suspected delayed Swallow;Cough - Immediate   Thin Liquid Thin Liquid: Impaired Oral Phase Impairments: Reduced labial seal Pharyngeal  Phase Impairments: Suspected delayed Swallow    Nectar Thick  Nectar Thick Liquid: Not tested   Honey Thick Honey Thick Liquid: Impaired Presentation: Cup;Spoon Oral Phase Impairments: Reduced labial seal Oral Phase Functional Implications: Left anterior spillage   Puree Puree: Within functional limits   Solid   GO   Solid: Not tested       Herbie Baltimore, MA CCC-SLP [phone]  Lynann Beaver 11/17/2017,2:11 PM

## 2017-11-17 NOTE — Evaluation (Signed)
Speech Language Pathology Evaluation Patient Details Name: Dakota [name] MRN: [id number] DOB: 01-Dec-1928 Today's Date: 11/17/2017 Time: 1125-1150 SLP Time Calculation (min) (ACUTE ONLY): 25 min  Problem List:  Patient Active Problem List   Diagnosis Date Noted  . Acute ischemic stroke ([place]) 11/16/2017  . Middle cerebral artery embolism, right 11/16/2017  . Caregiver stress 10/05/2015  . Hematuria 04/06/2015  . ARF (acute renal failure) ([place]) 07/18/2014  . Cardiogenic shock ([place]) 07/18/2014  . Atrial fibrillation with slow ventricular response ([place])   . NSTEMI (non-ST elevated myocardial infarction) ([facility])   . CHB (complete heart block) - s/p MDT 1 lead PPM 07/17/2014  . Advance care planning 07/03/2014  . Sebaceous cyst 04/11/2013  . Medicare annual wellness visit, subsequent 10/28/2012  . RIB PAIN, RIGHT SIDED 01/02/2009  . CAD, NATIVE VESSEL 11/25/2008  . Unspecified vitamin D deficiency 11/24/2008  . RECENT RET DETACH PARTIAL W/SINGLE DEFECT 11/24/2008  . ESSENTIAL HYPERTENSION, BENIGN 07/21/2008  . HLD (hyperlipidemia) 05/22/2007  . Atrial fibrillation ([place]) 05/22/2007  . GERD 05/22/2007  . GASTRIC ULCER, ACUTE, HEMORRHAGE 05/22/2007  . Hyperglycemia 05/22/2007   Past Medical History:  Past Medical History:  Diagnosis Date  . Anxiety   . Back pain, chronic   . Complete heart block ([place]) 07/18/2014   Medtronic [place] model [id number] (serial number [id number])  singe lead PPM  . GERD (gastroesophageal reflux disease) 10/2002  . Hyperlipidemia 12/1994  . Hypertension   . Hypokalemia   . Lung collapse 03/1991   Fall  . Permanent atrial fibrillation ([name])    Refused coumadin therapy   Past Surgical History:  Past Surgical History:  Procedure Laterality Date  . BACK SURGERY  11/91   with hardware fixation  . CARDIAC CATHETERIZATION  06/2004   30 % stenosis, EF normal  . ESOPHAGOGASTRODUODENOSCOPY N/A 01/06/2016   Procedure: ESOPHAGOGASTRODUODENOSCOPY (EGD);  Surgeon:  Otis Brace, MD;  Location: [facility];  Service: Gastroenterology;  Laterality: N/A;  . EYE SURGERY     Retinal bubble surgery, cataract   . LEFT HEART CATHETERIZATION WITH CORONARY ANGIOGRAM N/A 07/18/2014   Procedure: LEFT HEART CATHETERIZATION WITH CORONARY ANGIOGRAM;  Surgeon: Jettie Booze, MD; [facility] [place], LAD mild dz, D1 80%, D2 OK, CFX system OK, RCA OK, PDA 100%, med rx  . PERMANENT PACEMAKER INSERTION N/A 07/18/2014   Procedure: PERMANENT PACEMAKER INSERTION;  Surgeon: Thompson Grayer, MD; Medtronic [place] model [id number] (serial number [id number])    . RADIOLOGY WITH ANESTHESIA N/A 11/16/2017   Procedure: RADIOLOGY WITH ANESTHESIA;  Surgeon: Luanne Bras, MD;  Location: [place];  Service: Radiology;  Laterality: N/A;  . TEMPORARY PACEMAKER INSERTION N/A 07/18/2014   Procedure: TEMPORARY PACEMAKER INSERTION;  Surgeon: Peter M Martinique, MD;  Location: [facility] CATH LAB;  Service: Cardiovascular;  Laterality: N/A;   HPI:  Dakota [name] is a 84 y.o. male with history of permanent atrial fibrillation (not anticoagulated ), status post Medtronic permanent pacemaker for complete heart block, hypertension, hyperlipidemia, chronic back pain, and anxiety presenting with left-sided weakness and right gaze preference. He did not receive IV t-PA due to late presentation. S/P thrombectomy Dr Estanislado Pandy. CT head - Acute to subacute infarct in the right basal ganglia, external capsule, and posterior operculum. Right distal MCA branch hyperdensity near the operculum likely reflects thrombus.    Assessment / Plan / Recommendation Clinical Impression  Pt demonstrates severe dysarthria secondary to left CN XII and VII weakness with mostly unintelligible conversation without context. In response to direct questions pt is more  intelligible and relatively well oriented, though he is restless and anticipated to have night time delirium due to cognitive impairment. He is inattentive to left and cannot direct  gaze to left despite cueing. Potential for functional gains is guarded due to poor awareness and age. Will follow to progress pts speech intelligility and cognitive function with basic tasks.     SLP Assessment  SLP Recommendation/Assessment: Patient needs continued Speech Lanaguage Pathology Services SLP Visit Diagnosis: Dysarthria and anarthria (R47.1);Frontal lobe and executive function deficit Frontal lobe and executive function deficit following: Cerebral infarction    Follow Up Recommendations  Skilled Nursing facility    Frequency and Duration min 2x/week  2 weeks      SLP Evaluation Cognition  Overall Cognitive Status: Impaired/Different from baseline Arousal/Alertness: Awake/alert Orientation Level: Oriented to person;Disoriented to situation;Disoriented to time;Disoriented to place Attention: Focused;Sustained Focused Attention: Appears intact Sustained Attention: Impaired Memory: Impaired Memory Impairment: Decreased short term memory Decreased Short Term Memory: Verbal basic Awareness: Impaired Awareness Impairment: Intellectual impairment Problem Solving: Impaired Problem Solving Impairment: Verbal basic;Functional basic Behaviors: Restless Safety/Judgment: Impaired       Comprehension  Auditory Comprehension Overall Auditory Comprehension: Appears within functional limits for tasks assessed    Expression Verbal Expression Overall Verbal Expression: Appears within functional limits for tasks assessed Written Expression Dominant Hand: Right   Oral / Motor  Oral Motor/Sensory Function Overall Oral Motor/Sensory Function: Moderate impairment Facial ROM: Reduced left;Suspected CN VII (facial) dysfunction Facial Symmetry: Abnormal symmetry left;Suspected CN VII (facial) dysfunction Facial Strength: Reduced left;Suspected CN VII (facial) dysfunction Facial Sensation: Reduced left;Suspected CN V (Trigeminal) dysfunction Lingual ROM: Reduced left;Suspected CN XII  (hypoglossal) dysfunction Lingual Symmetry: Abnormal symmetry left;Suspected CN XII (hypoglossal) dysfunction Lingual Strength: Suspected CN XII (hypoglossal) dysfunction Motor Speech Overall Motor Speech: Impaired Respiration: Within functional limits Level of Impairment: Conversation Phonation: Normal Articulation: Impaired Level of Impairment: Word Intelligibility: Intelligibility reduced Word: 25-49% accurate Phrase: 0-24% accurate Sentence: 0-24% accurate Conversation: 0-24% accurate Motor Planning: Witnin functional limits Motor Speech Errors: Unaware;Consistent Interfering Components: Inadequate dentition   GO                   Herbie Baltimore, MA CCC-SLP [phone]  Lynann Beaver 11/17/2017, 2:19 PM

## 2017-11-17 NOTE — Progress Notes (Signed)
Rehab Admissions Coordinator Note:  Patient was screened by Cleatrice Burke for appropriateness for an Inpatient Acute Rehab Consult per PT recommendation.   At this time, we are recommending Inpatient Rehab consult.  Cleatrice Burke 11/17/2017, 12:51 PM  I can be reached at [phone].

## 2017-11-18 ENCOUNTER — Inpatient Hospital Stay (HOSPITAL_COMMUNITY): Payer: Medicare Other

## 2017-11-18 DIAGNOSIS — I639 Cerebral infarction, unspecified: Secondary | ICD-10-CM

## 2017-11-18 LAB — CBC
HCT: 44.9 % (ref 39.0–52.0)
Hemoglobin: 14.5 g/dL (ref 13.0–17.0)
MCH: 29.5 pg (ref 26.0–34.0)
MCHC: 32.3 g/dL (ref 30.0–36.0)
MCV: 91.4 fL (ref 78.0–100.0)
PLATELETS: 147 10*3/uL — AB (ref 150–400)
RBC: 4.91 MIL/uL (ref 4.22–5.81)
RDW: 13.2 % (ref 11.5–15.5)
WBC: 19.1 10*3/uL — ABNORMAL HIGH (ref 4.0–10.5)

## 2017-11-18 LAB — BASIC METABOLIC PANEL
ANION GAP: 7 (ref 5–15)
BUN: 27 mg/dL — ABNORMAL HIGH (ref 8–23)
CALCIUM: 8.8 mg/dL — AB (ref 8.9–10.3)
CO2: 22 mmol/L (ref 22–32)
Chloride: 110 mmol/L (ref 98–111)
Creatinine, Ser: 1.05 mg/dL (ref 0.61–1.24)
GFR calc non Af Amer: >60 mL/min (ref >60)
GLUCOSE: 143 mg/dL — AB (ref 70–99)
Potassium: 4.6 mmol/L (ref 3.5–5.1)
Sodium: 139 mmol/L (ref 135–145)

## 2017-11-18 NOTE — Progress Notes (Signed)
STROKE TEAM PROGRESS NOTE   SUBJECTIVE (INTERVAL HISTORY) When son was at the bedside this morning.  The patient appeared lethargic however the patient's son felt that he was doing much better today than yesterday.  Part of his lethargy was felt to be secondary to Seroquel.  The patient is scheduled for a modified barium swallow today.   OBJECTIVE Temp:  [97.9 F (36.6 C)-100.6 F (38.1 C)] 97.9 F (36.6 C) (07/20 0800) Pulse Rate:  [64-86] 74 (07/20 1100) Cardiac Rhythm: Atrial fibrillation (07/20 0800) Resp:  [11-21] 15 (07/20 1100) BP: (108-155)/(53-86) 155/82 (07/20 1100) SpO2:  [93 %-100 %] 95 % (07/20 1100)  CBC:  Recent Labs  Lab 11/16/17 1134 11/17/17 0500 11/18/17 0439  WBC 14.9* 15.1* 19.1*  NEUTROABS 11.6* 12.8*  --   HGB 16.7 14.5 14.5  HCT 51.5 44.7 44.9  MCV 91.0 91.2 91.4  PLT 204 196 147*    Basic Metabolic Panel:  Recent Labs  Lab 11/17/17 0500 11/18/17 0439  NA 139 139  K 4.4 4.6  CL 109 110  CO2 22 22  GLUCOSE 184* 143*  BUN 24* 27*  CREATININE 1.26* 1.05  CALCIUM 8.5* 8.8*    Lipid Panel:     Component Value Date/Time   CHOL 151 11/17/2017 0500   TRIG 61 11/17/2017 0500   HDL 37 (L) 11/17/2017 0500   CHOLHDL 4.1 11/17/2017 0500   VLDL 12 11/17/2017 0500   LDLCALC 102 (H) 11/17/2017 0500   HgbA1c:  Lab Results  Component Value Date   HGBA1C 6.1 (H) 11/17/2017   Urine Drug Screen: No results found for: LABOPIA, COCAINSCRNUR, LABBENZ, AMPHETMU, THCU, LABBARB  Alcohol Level No results found for: [place] I have personally reviewed the radiological images below and agree with the radiology interpretations.  Ct Angio Head W Or Wo Contrast Ct Angio Neck W Or Wo Contrast Ct Cerebral Perfusion W Contrast 11/16/2017 IMPRESSION:  1. Negative for large vessel occlusion. No discrete right MCA branch occlusion is identified, but moderate to severe right M3 branch irregularity is noted.  2. CT perfusion suggests posterior right MCA  territory penumbra, but does not detect core infarct with the standard parameters. However, a small 5 mL infarct is suggested with more sensitive CBF (<38%) parameter, located along the posterior operculum. Therefore, it is possible that the right lentiform finding is chronic.  3. Mild for age atherosclerosis in the head and neck with no hemodynamically significant arterial stenosis identified outside of the right M3 findings in #1.   Ct Head Wo Contrast 11/16/2017 IMPRESSION:  Acute to subacute infarct in the right basal ganglia, external capsule, and posterior operculum. Right distal MCA branch hyperdensity near the operculum likely reflects thrombus.    Transthoracic Echocardiogram  11/17/17 Study Conclusions - Left ventricle: The cavity size was normal. There was moderate   concentric hypertrophy. Systolic function was vigorous. The   estimated ejection fraction was in the range of 65% to 70%. Wall   motion was normal; there were no regional wall motion   abnormalities. - Aortic valve: Trileaflet; mildly thickened, mildly calcified   leaflets. There was mild regurgitation. - Aortic root: The aortic root was normal in size. - Mitral valve: Calcified annulus. Mildly thickened leaflets .   There was mild regurgitation. - Left atrium: The atrium was moderately dilated. - Right ventricle: Systolic function was normal. - Right atrium: The atrium was moderately dilated. - Tricuspid valve: There was mild regurgitation. - Pulmonary arteries: Systolic pressure was within the  normal   range. - Inferior vena cava: The vessel was normal in size. - Pericardium, extracardiac: There was no pericardial effusion. Impressions: - No cardiac source of emboli was indentified.   CT Head WO Contrast 11/17/2017 IMPRESSION: 1. Propagation of acute RIGHT MCA territory infarct now involving RIGHT insula and RIGHT temporoparietal lobes. Similar RIGHT basal ganglia acute infarct. No hemorrhagic  conversion. 2. Dense distal RIGHT insular MCA consistent with thromboembolism.    Cerebral angiogram and Intervention - Dr Estanislado Pandy 11/16/2017 S/P RT common carotid arteriogram followed by endovascular revascularization of occluded RT MCA inf division distal M2 seg  With x 1 pass with  With 68mm x 20 mm trevoprovue retriever device achieving a TICI 2bplus revascularization.   PHYSICAL EXAM  Temp:  [97.9 F (36.6 C)-100.6 F (38.1 C)] 97.9 F (36.6 C) (07/20 0800) Pulse Rate:  [64-86] 74 (07/20 1100) Resp:  [11-21] 15 (07/20 1100) BP: (108-155)/(53-86) 155/82 (07/20 1100) SpO2:  [93 %-100 %] 95 % (07/20 1100)  General - Frail elderly Caucasian male in no apparent distress.  Ophthalmologic - fundi not visualized due to noncooperation.  Cardiovascular - irregularly irregular heart rate and rhythm.  Neuro - drowsy but easily arousable, oriented to place and people, but not to time. Severe dysarthria but able to name and repeat. Following commands. PERRL, right gaze preference, not crossing midline. Left neglect, not blinking to visual threat on the left. Left facial droop. Tongue midline. Left UE 1/5 and LE 2/5 on pain stimulation. RUE and RLE 4/5. DTR 1+ and left babinski positive. Sensation symmetrical, coordination not cooperative and gait not tested.   ASSESSMENT/PLAN Mr. RITVIK MCZEAL is a 84 y.o. male with history of permanent atrial fibrillation (not anticoagulated ), status post Medtronic permanent pacemaker for complete heart block, hypertension, hyperlipidemia, chronic back pain, and anxiety presenting with left-sided weakness and right gaze preference. He did not receive IV t-PA due to late presentation. S/P thrombectomy Dr Estanislado Pandy.  Stroke: right MCA infarct, embolic secondary to atrial fibrillation not on AC.  Resultant  Left neglect, right gaze and left hemiparesis  CT head - Acute to subacute infarct in the right basal ganglia, external capsule, and posterior  operculum. Right distal MCA branch hyperdensity near the operculum likely reflects thrombus.   CTA H&N - likely right M3 occlusion.   CT Perfusion - suggests posterior right MCA territory penumbra - (?? At least partial pseudo-normalization, exact size of penumbra is not clear)  DSA - right M2 occlusion s/p IR with TICI 2b+ reperfusion  MRI head - not able to do due to PPM  Repeat Head CT - Propagation of acute RT MCA territory infarct now involving RT insula and RT temporoparietal lobes. Similar RT basal ganglia acute infarct. Dense distal RT insular MCA c/w thromboembolism.  2D Echo - EF 65 to 70%.  No cardiac source of emboli identified.  LDL - 102  HgbA1c - 6.1  VTE prophylaxis - lovenox Diet Order           DIET - DYS 1 Room service appropriate? Yes; Fluid consistency: Honey Thick  Diet effective now          aspirin 81 mg daily prior to admission, now on aspirin suppository 300 mg daily.  Ongoing aggressive stroke risk factor management  Therapy recommendations:  CIR recommended - the patient has been seen by Dr. Tessa Lerner.  Disposition:  Pending  Afib not on AC  Hx of afib following with cardiology  Refused coumadin in the past due  to ? Coumadin made leg weak  On ASA for now  Consider DOAC and timing based on MRI or CT repeat later.  In hospital delirium  Hx of in hospital delirium  High risk for delirium  Put on seroquel 25mg  bid once pass swallow  Non-pharmacological means first  Consider Haldol IV if needed  Hypertension  Stable on Cardene . BP goal < 140 after IR procedure . Wean off cardene  . Long-term BP goal normotensive  Hyperlipidemia  Lipid lowering medication PTA:  none  LDL 102, goal < 70  Current lipid lowering medication: NPO -add statin with p.o. access  Continue statin at discharge  Dysphagia   Severe dysarthria  Speech pending  If not passing swallow, pt agreed with cortrak.  Chewing tobacco  Put on nicotine  patch  Could be part of the reason of delirium  Other Stroke Risk Factors  Advanced age  Former cigarette smoker - quit  Other Active Problems  Complete heart block s/p Permanent pacemaker  Renal insufficiency BUN 26 ; creatinine 1.26 -> daily labs  Leukocytosis -> 19.1 (Tmax yesterday 100.6 - afebrile today) Currently not on abxs. CXR today.    PLAN  Modified barium swallow with speech therapy today. May need CorTrak tube placement.  Possible transfer out of unit today to telemetry floor. Check CXR first  Hospital day # 2  Mikey Bussing PA-C [facility] Pager [phone] 11/18/2017, 12:51 PM I have personally examined this patient, reviewed notes, independently viewed imaging studies, participated in medical decision making and plan of care.ROS completed by me personally and pertinent positives fully documented  I have made any additions or clarifications directly to the above note. Agree with note above. Recommend therapy to check swallow eval today. Mobilize out of bed. Transfer to neurology floor bed later today. Long discussion at bedside the patient and son and answered questions about his care. He will likely need transfer to inpatient rehabilitation in a few days.Continue aspirin for now and change to eliquis in 5-7 days post stroke This patient is critically ill and at significant risk of neurological worsening, death and care requires constant monitoring of vital signs, hemodynamics,respiratory and cardiac monitoring, extensive review of multiple databases, frequent neurological assessment, discussion with family, other specialists and medical decision making of high complexity.I have made any additions or clarifications directly to the above note.This critical care time does not reflect procedure time, or teaching time or supervisory time of PA/NP/Med Resident etc but could involve care discussion time.  I spent 30 minutes of neurocritical care time  in  the care of  this patient.      [name], MD Medical Director [facility] Pager: [phone] 11/18/2017 2:24 PM   To contact Stroke Continuity provider, please refer to [email or web address] After hours, contact General Neurology

## 2017-11-18 NOTE — Progress Notes (Signed)
SLP Cancellation Note  Patient Details Name: Dakota [name] MRN: [id number] DOB: 1929-01-28   Cancelled treatment:       Reason Eval/Treat Not Completed: Fatigue/lethargy limiting ability to participate. Pt arrived to [place]; per son, pt lethargic after Seroquel given this am. Attempted to arouse with verbal, tactile stimuli however pt unable to maintain alertness beyond brief intervals of 5-10 seconds. Will hold MBS, attempt next date.  Deneise Lever, [place], [place] Speech-Language Pathologist [place] 11/18/2017, 12:19 PM

## 2017-11-18 NOTE — Progress Notes (Signed)
Referring Physician(s): Dr Leonie Man  Supervising Physician: Luanne Bras  Patient Status:  [facility] - In-pt  Chief Complaint:  CVA Rt MCA revascularization 11/16/17  Subjective:  Better daily Speech better-- able to understand pretty well today Moving all 4s-- left much weaker   Allergies: Warfarin and related; Atorvastatin; Ezetimibe; Diazepam; and Penicillins  Medications: Prior to Admission medications   Medication Sig Start Date End Date Taking? Authorizing Provider  ALPRAZolam (XANAX) 0.5 MG tablet TAKE ONE-HALF TABLET BY MOUTH TWICE DAILY AS NEEDED FOR ANXIETY Patient taking differently: Take 0.25 mg by mouth 2 (two) times daily as needed for anxiety. TAKE ONE-HALF TABLET BY MOUTH TWICE DAILY AS NEEDED FOR ANXIETY 10/14/17  Yes Tonia Ghent, MD  aspirin 81 MG tablet Take 81 mg by mouth 2 (two) times daily.    Yes [provider]  calcium carbonate (TUMS - DOSED IN MG ELEMENTAL CALCIUM) 500 MG chewable tablet Chew 1 tablet by mouth daily as needed for indigestion.    Yes [provider]  carvedilol (COREG) 25 MG tablet TAKE 1 TABLET BY MOUTH TWICE DAILY WITH  A  MEAL. 08/03/17  Yes Josue Hector, MD  docusate sodium (COLACE) 100 MG capsule Take 1 capsule (100 mg total) by mouth daily as needed for mild constipation. 01/23/17  Yes Tonia Ghent, MD  hydrochlorothiazide (MICROZIDE) 12.5 MG capsule TAKE ONE CAPSULE BY MOUTH ONCE DAILY 07/27/17  Yes Josue Hector, MD  Magnesium Hydroxide (MILK OF MAGNESIA PO) Take 1 tablet by mouth daily as needed.   Yes [provider]  nitroGLYCERIN (NITROSTAT) 0.4 MG SL tablet Place 1 tablet (0.4 mg total) under the tongue every 5 (five) minutes as needed for chest pain. 01/07/15  Yes Josue Hector, MD  pantoprazole (PROTONIX) 40 MG tablet Take 1 tablet (40 mg total) by mouth daily. 01/06/16  Yes Domenic Moras, PA-C  ranitidine (ZANTAC) 150 MG tablet TAKE 1 TABLET BY MOUTH ONCE DAILY 07/27/17  Yes Josue Hector,  MD     Vital Signs: BP (!) 154/86   Pulse 73   Temp 97.9 F (36.6 C) (Axillary)   Resp 11   Ht 5\' 8"  (1.727 m)   Wt 192 lb 14.4 oz (87.5 kg)   SpO2 95%   BMI 29.33 kg/m   Physical Exam  Constitutional:  can follow commands Tongue midline Can turn head to left if asked to-- favors rt side   HENT:  Speech improving Cognition improving  Musculoskeletal: Normal range of motion.  Moves all 4s to command Left weaker than rt Rt groin is NT no bleeding; NT Rt foot pulses 2+   Skin: Skin is warm and dry.  Vitals reviewed.   Imaging: Ct Angio Head W Or Wo Contrast  Result Date: 11/16/2017 CLINICAL DATA:  84 year old male code stroke. Altered mental status. Evidence of right basal ganglia/MCA lacunar and possible hyperdense right MCA on noncontrast head CT 1309 hours today. EXAM: CT ANGIOGRAPHY HEAD AND NECK CT PERFUSION BRAIN TECHNIQUE: Multidetector CT imaging of the head and neck was performed using the standard protocol during bolus administration of intravenous contrast. Multiplanar CT image reconstructions and MIPs were obtained to evaluate the vascular anatomy. Carotid stenosis measurements (when applicable) are obtained utilizing NASCET criteria, using the distal internal carotid diameter as the denominator. Multiphase CT imaging of the brain was performed following IV bolus contrast injection. Subsequent parametric perfusion maps were calculated using RAPID software. CONTRAST:  64mL ISOVUE-370 IOPAMIDOL (ISOVUE-370) INJECTION 76% COMPARISON:  Head CT without contrast 1309 hours today. FINDINGS: CT Brain Perfusion Findings: ASPECTS approximately 7 on the earlier noncontrast CT. CBF (<30%) Volume: 0 milliliters (CBF less than 38% the techs 5 milliliters along the posterior operculum). Perfusion (Tmax>6.0s) volume: 41 milliliters Mismatch Volume: 41 milliliters Infarction Location:Posterior right MCA territory. Other findings:  Some motion artifact noted CTA NECK Skeleton: Absent  dentition. No acute osseous abnormality identified. Upper chest: Left chest cardiac pacemaker type device. Mild dependent pulmonary atelectasis. No superior mediastinal lymphadenopathy. Other neck: Negative.  No neck mass or lymphadenopathy. Aortic arch: 3 vessel arch configuration. Mild to moderate arch atherosclerosis. Right carotid system: No brachiocephalic or right CCA origin stenosis. Minimal right CCA plaque including at the right carotid bifurcation. Mild soft plaque at the right ICA origin. No cervical right ICA stenosis. Left carotid system: No left CCA origin stenosis. Minimal left CCA plaque including at the left carotid bifurcation. Mild proximal left ICA plaque with no stenosis. Vertebral arteries: No proximal right subclavian artery stenosis. Normal right vertebral artery origin. Patent right vertebral artery to the skull base without stenosis. No proximal left subclavian artery stenosis despite mild plaque. Soft and calcified plaque at the left vertebral artery origin and proximal V1 segment (series 10, image 29), with only mild stenosis. Codominant vertebral arteries, the left is patent to the skull base with mild V3 segment plaque but no stenosis. CTA HEAD Posterior circulation: Codominant distal vertebral arteries are patent without stenosis. Patent PICA origins and vertebrobasilar junction. Patent basilar artery without stenosis. Normal SCA and left PCA origin. Tortuous right P1 segment and right posterior communicating artery. The left Posterior communicating artery is diminutive or absent. Bilateral PCA branches are within normal limits. Anterior circulation: Both ICA siphons are patent. There is only minor siphon calcified plaque, greater on the left. Normal ophthalmic and right posterior communicating artery origins. Patent carotid termini. Normal MCA and ACA origins. Mildly tortuous A1 segments. Anterior communicating artery and bilateral ACA branches are within normal limits. Left MCA M1  segment is tortuous. Left MCA bifurcation is patent without stenosis. Left MCA branches are within normal limits. Right MCA M1 segment is tortuous but patent without stenosis. Right MCA bifurcation is patent. No right MCA M2 branch occlusion identified. Areas of moderate to severe right M3 branch irregularity are noted on series 14, image 15. Venous sinuses: Patent. Anatomic variants: None. Review of the MIP images confirms the above findings IMPRESSION: 1. Negative for large vessel occlusion. No discrete right MCA branch occlusion is identified, but moderate to severe right M3 branch irregularity is noted. 2. CT perfusion suggests posterior right MCA territory penumbra, but does not detect core infarct with the standard parameters. However, a small 5 mL infarct is suggested with more sensitive CBF (<38%) parameter, located along the posterior operculum. Therefore, it is possible that the right lentiform finding is chronic. 3. Mild for age atherosclerosis in the head and neck with no hemodynamically significant arterial stenosis identified outside of the right M3 findings in #1. 4.  Study discussed by telephone on 11/16/2017 at 15:19 to Dr. Rory Percy. Electronically Signed   By: Genevie Ann M.D.   On: 11/16/2017 15:20   Ct Head Wo Contrast  Result Date: 11/17/2017 CLINICAL DATA:  Delirium, somnolent. Assessment for stroke or bleed. Status post endovascular revascularization of occluded RIGHT M2 segment. EXAM: CT HEAD WITHOUT CONTRAST TECHNIQUE: Contiguous axial images were obtained from the base of the skull through the vertex without intravenous contrast. COMPARISON:  CT HEAD November 16, 2017. FINDINGS:  BRAIN: No intraparenchymal hemorrhage, mass effect nor midline shift. New RIGHT temporoparietal and RIGHT insula wedge-like hypodensity with loss of gray-white matter differentiation. Evolving acute RIGHT basal ganglia infarct. Patchy to confluent supratentorial white matter hypodensities. No parenchymal brain volume loss  for age. Cavum septum pellucidum. Patchy to confluent supratentorial white matter hypodensities compatible with moderate chronic small vessel ischemic changes. No abnormal extra-axial fluid collections. VASCULAR: Dense distal RIGHT insular MCA (sagittal image 21). Mild calcific atherosclerosis of the carotid siphons. SKULL: No skull fracture. Subcentimeter probable bone island RIGHT clivus. Mild similar LEFT frontal and LEFT parietal scalp hematomas. SINUSES/ORBITS: Lobulated sphenoid ethmoidal mucosal thickening without paranasal sinus air-fluid levels. Mastoid air cells are well aerated.The included ocular globes and orbital contents are non-suspicious. Status post LEFT ocular lens implant. OTHER: None. IMPRESSION: 1. Propagation of acute RIGHT MCA territory infarct now involving RIGHT insula and RIGHT temporoparietal lobes. Similar RIGHT basal ganglia acute infarct. No hemorrhagic conversion. 2. Dense distal RIGHT insular MCA consistent with thromboembolism. 3. These results will be called to the ordering clinician or representative by the Radiologist Assistant, and communication documented in the PACS or zVision Dashboard. Electronically Signed   By: Elon Alas M.D.   On: 11/17/2017 17:29   Ct Head Wo Contrast  Result Date: 11/16/2017 CLINICAL DATA:  Altered mental status. EXAM: CT HEAD WITHOUT CONTRAST TECHNIQUE: Contiguous axial images were obtained from the base of the skull through the vertex without intravenous contrast. COMPARISON:  None. FINDINGS: Brain: Hypodensity within the right basal ganglia, external capsule, and posterior operculum. No evidence of hemorrhage, hydrocephalus, extra-axial collection or mass lesion/mass effect. Moderate age related cerebral atrophy. Scattered moderate periventricular and subcortical white matter hypodensities are nonspecific, but favored to reflect chronic microvascular ischemic changes. Vascular: Right distal MCA branch hyperdensity near the operculum  likely reflecting thrombus (series 5, image 46). Atherosclerotic vascular calcification of the carotid siphons. Skull: Negative for fracture or focal lesion. Sinuses/Orbits: No acute finding. Other: None. IMPRESSION: 1. Acute to subacute infarct in the right basal ganglia, external capsule, and posterior operculum. Right distal MCA branch hyperdensity near the operculum likely reflects thrombus. These results were called by telephone at the time of interpretation on 11/16/2017 at 1:47 pm to Dr. Carmin Muskrat , who verbally acknowledged these results. Electronically Signed   By: Titus Dubin M.D.   On: 11/16/2017 13:49   Ct Angio Neck W Or Wo Contrast  Result Date: 11/16/2017 CLINICAL DATA:  84 year old male code stroke. Altered mental status. Evidence of right basal ganglia/MCA lacunar and possible hyperdense right MCA on noncontrast head CT 1309 hours today. EXAM: CT ANGIOGRAPHY HEAD AND NECK CT PERFUSION BRAIN TECHNIQUE: Multidetector CT imaging of the head and neck was performed using the standard protocol during bolus administration of intravenous contrast. Multiplanar CT image reconstructions and MIPs were obtained to evaluate the vascular anatomy. Carotid stenosis measurements (when applicable) are obtained utilizing NASCET criteria, using the distal internal carotid diameter as the denominator. Multiphase CT imaging of the brain was performed following IV bolus contrast injection. Subsequent parametric perfusion maps were calculated using RAPID software. CONTRAST:  104mL ISOVUE-370 IOPAMIDOL (ISOVUE-370) INJECTION 76% COMPARISON:  Head CT without contrast 1309 hours today. FINDINGS: CT Brain Perfusion Findings: ASPECTS approximately 7 on the earlier noncontrast CT. CBF (<30%) Volume: 0 milliliters (CBF less than 38% the techs 5 milliliters along the posterior operculum). Perfusion (Tmax>6.0s) volume: 41 milliliters Mismatch Volume: 41 milliliters Infarction Location:Posterior right MCA territory. Other  findings:  Some motion artifact noted CTA NECK Skeleton: Absent  dentition. No acute osseous abnormality identified. Upper chest: Left chest cardiac pacemaker type device. Mild dependent pulmonary atelectasis. No superior mediastinal lymphadenopathy. Other neck: Negative.  No neck mass or lymphadenopathy. Aortic arch: 3 vessel arch configuration. Mild to moderate arch atherosclerosis. Right carotid system: No brachiocephalic or right CCA origin stenosis. Minimal right CCA plaque including at the right carotid bifurcation. Mild soft plaque at the right ICA origin. No cervical right ICA stenosis. Left carotid system: No left CCA origin stenosis. Minimal left CCA plaque including at the left carotid bifurcation. Mild proximal left ICA plaque with no stenosis. Vertebral arteries: No proximal right subclavian artery stenosis. Normal right vertebral artery origin. Patent right vertebral artery to the skull base without stenosis. No proximal left subclavian artery stenosis despite mild plaque. Soft and calcified plaque at the left vertebral artery origin and proximal V1 segment (series 10, image 29), with only mild stenosis. Codominant vertebral arteries, the left is patent to the skull base with mild V3 segment plaque but no stenosis. CTA HEAD Posterior circulation: Codominant distal vertebral arteries are patent without stenosis. Patent PICA origins and vertebrobasilar junction. Patent basilar artery without stenosis. Normal SCA and left PCA origin. Tortuous right P1 segment and right posterior communicating artery. The left Posterior communicating artery is diminutive or absent. Bilateral PCA branches are within normal limits. Anterior circulation: Both ICA siphons are patent. There is only minor siphon calcified plaque, greater on the left. Normal ophthalmic and right posterior communicating artery origins. Patent carotid termini. Normal MCA and ACA origins. Mildly tortuous A1 segments. Anterior communicating artery and  bilateral ACA branches are within normal limits. Left MCA M1 segment is tortuous. Left MCA bifurcation is patent without stenosis. Left MCA branches are within normal limits. Right MCA M1 segment is tortuous but patent without stenosis. Right MCA bifurcation is patent. No right MCA M2 branch occlusion identified. Areas of moderate to severe right M3 branch irregularity are noted on series 14, image 15. Venous sinuses: Patent. Anatomic variants: None. Review of the MIP images confirms the above findings IMPRESSION: 1. Negative for large vessel occlusion. No discrete right MCA branch occlusion is identified, but moderate to severe right M3 branch irregularity is noted. 2. CT perfusion suggests posterior right MCA territory penumbra, but does not detect core infarct with the standard parameters. However, a small 5 mL infarct is suggested with more sensitive CBF (<38%) parameter, located along the posterior operculum. Therefore, it is possible that the right lentiform finding is chronic. 3. Mild for age atherosclerosis in the head and neck with no hemodynamically significant arterial stenosis identified outside of the right M3 findings in #1. 4.  Study discussed by telephone on 11/16/2017 at 15:19 to Dr. Rory Percy. Electronically Signed   By: Genevie Ann M.D.   On: 11/16/2017 15:20   Ct Cerebral Perfusion W Contrast  Result Date: 11/16/2017 CLINICAL DATA:  84 year old male code stroke. Altered mental status. Evidence of right basal ganglia/MCA lacunar and possible hyperdense right MCA on noncontrast head CT 1309 hours today. EXAM: CT ANGIOGRAPHY HEAD AND NECK CT PERFUSION BRAIN TECHNIQUE: Multidetector CT imaging of the head and neck was performed using the standard protocol during bolus administration of intravenous contrast. Multiplanar CT image reconstructions and MIPs were obtained to evaluate the vascular anatomy. Carotid stenosis measurements (when applicable) are obtained utilizing NASCET criteria, using the distal  internal carotid diameter as the denominator. Multiphase CT imaging of the brain was performed following IV bolus contrast injection. Subsequent parametric perfusion maps were calculated using RAPID  software. CONTRAST:  73mL ISOVUE-370 IOPAMIDOL (ISOVUE-370) INJECTION 76% COMPARISON:  Head CT without contrast 1309 hours today. FINDINGS: CT Brain Perfusion Findings: ASPECTS approximately 7 on the earlier noncontrast CT. CBF (<30%) Volume: 0 milliliters (CBF less than 38% the techs 5 milliliters along the posterior operculum). Perfusion (Tmax>6.0s) volume: 41 milliliters Mismatch Volume: 41 milliliters Infarction Location:Posterior right MCA territory. Other findings:  Some motion artifact noted CTA NECK Skeleton: Absent dentition. No acute osseous abnormality identified. Upper chest: Left chest cardiac pacemaker type device. Mild dependent pulmonary atelectasis. No superior mediastinal lymphadenopathy. Other neck: Negative.  No neck mass or lymphadenopathy. Aortic arch: 3 vessel arch configuration. Mild to moderate arch atherosclerosis. Right carotid system: No brachiocephalic or right CCA origin stenosis. Minimal right CCA plaque including at the right carotid bifurcation. Mild soft plaque at the right ICA origin. No cervical right ICA stenosis. Left carotid system: No left CCA origin stenosis. Minimal left CCA plaque including at the left carotid bifurcation. Mild proximal left ICA plaque with no stenosis. Vertebral arteries: No proximal right subclavian artery stenosis. Normal right vertebral artery origin. Patent right vertebral artery to the skull base without stenosis. No proximal left subclavian artery stenosis despite mild plaque. Soft and calcified plaque at the left vertebral artery origin and proximal V1 segment (series 10, image 29), with only mild stenosis. Codominant vertebral arteries, the left is patent to the skull base with mild V3 segment plaque but no stenosis. CTA HEAD Posterior circulation:  Codominant distal vertebral arteries are patent without stenosis. Patent PICA origins and vertebrobasilar junction. Patent basilar artery without stenosis. Normal SCA and left PCA origin. Tortuous right P1 segment and right posterior communicating artery. The left Posterior communicating artery is diminutive or absent. Bilateral PCA branches are within normal limits. Anterior circulation: Both ICA siphons are patent. There is only minor siphon calcified plaque, greater on the left. Normal ophthalmic and right posterior communicating artery origins. Patent carotid termini. Normal MCA and ACA origins. Mildly tortuous A1 segments. Anterior communicating artery and bilateral ACA branches are within normal limits. Left MCA M1 segment is tortuous. Left MCA bifurcation is patent without stenosis. Left MCA branches are within normal limits. Right MCA M1 segment is tortuous but patent without stenosis. Right MCA bifurcation is patent. No right MCA M2 branch occlusion identified. Areas of moderate to severe right M3 branch irregularity are noted on series 14, image 15. Venous sinuses: Patent. Anatomic variants: None. Review of the MIP images confirms the above findings IMPRESSION: 1. Negative for large vessel occlusion. No discrete right MCA branch occlusion is identified, but moderate to severe right M3 branch irregularity is noted. 2. CT perfusion suggests posterior right MCA territory penumbra, but does not detect core infarct with the standard parameters. However, a small 5 mL infarct is suggested with more sensitive CBF (<38%) parameter, located along the posterior operculum. Therefore, it is possible that the right lentiform finding is chronic. 3. Mild for age atherosclerosis in the head and neck with no hemodynamically significant arterial stenosis identified outside of the right M3 findings in #1. 4.  Study discussed by telephone on 11/16/2017 at 15:19 to Dr. Rory Percy. Electronically Signed   By: Genevie Ann M.D.   On:  11/16/2017 15:20   Dg Chest Port 1 View  Result Date: 11/16/2017 CLINICAL DATA:  Encounter for AMS and SOB EXAM: PORTABLE CHEST - 1 VIEW COMPARISON:  01/06/2016 FINDINGS: Relatively low lung volumes with crowding of bronchovascular structures. No confluent airspace infiltrate or overt edema. Heart size upper limits  normal for technique. No effusion or pneumothorax. Stable left subclavian single lead transvenous pacemaker extending towards the right ventricular apex. Lumbar fixation hardware partially visualized. IMPRESSION: No acute cardiopulmonary disease. Electronically Signed   By: Lucrezia Europe M.D.   On: 11/16/2017 11:49    Labs:  CBC: Recent Labs    01/23/17 1531 11/16/17 1134 11/17/17 0500 11/18/17 0439  WBC 9.0 14.9* 15.1* 19.1*  HGB 15.4 16.7 14.5 14.5  HCT 46.2 51.5 44.7 44.9  PLT 245.0 204 196 147*    COAGS: No results for input(s): INR, APTT in the last 8760 hours.  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

## 2017-11-18 NOTE — Progress Notes (Signed)
Pt arrived to unit. Family at bedside.

## 2017-11-18 NOTE — Progress Notes (Signed)
Pt transferred to 3W-6. Report given to Ailene Ravel, Therapist, sports.

## 2017-11-18 NOTE — Progress Notes (Signed)
  Speech Language Pathology Treatment: Dysphagia;Cognitive-Linquistic  Patient Details Name: Dakota [name] MRN: [id number] DOB: [birth year] Today's Date: 11/18/2017 Time: 0240-9735 SLP Time Calculation (min) (ACUTE ONLY): 26 min  Assessment / Plan / Recommendation Clinical Impression  Pt seen for skilled ST treatment targeting dysphagia and cognitive communication. SLP facilitated left attention with moderate verbal, tactile cues. Targeted sustained attention to PO trials with usual mod A, verbal and tactile cues. For dysphagia treatment, SLP assisted with retrieval of honey thick liquids by spoon and cup, purees by teaspoon. Moderate tactile assist required. In general teaspoons of honey were better tolerated; cup sips resulted in decreased coordination, left anterior spillage, and increase in suspected delayed swallow initiation. There were no overt signs of aspiration initially, however as trials progressed, pt began to demonstrate delayed, weak coughing and wheezing. Fever also noted overnight. Will proceed with MBS for objective evaluation of swallow function. D/w RN; may continue necessary medications crushed in puree, with additional recommendations to follow MBS. Pt, son in agreement.     HPI HPI: Dakota [name] is a 84 y.o. male with history of permanent atrial fibrillation (not anticoagulated ), status post [employer] permanent pacemaker for complete heart block, hypertension, hyperlipidemia, chronic back pain, and anxiety presenting with left-sided weakness and right gaze preference. He did not receive IV t-PA due to late presentation. S/P thrombectomy Dr Estanislado Pandy. CT head - Acute to subacute infarct in the right basal ganglia, external capsule, and posterior operculum. Right distal MCA branch hyperdensity near the operculum likely reflects thrombus.       SLP Plan  MBS       Recommendations  Medication Administration: Crushed with puree Supervision: Staff to assist with self  feeding;Full supervision/cueing for compensatory strategies Compensations: Slow rate;Small sips/bites                Follow up Recommendations: Skilled Nursing facility SLP Visit Diagnosis: Dysphagia, unspecified (R13.10);Attention and concentration deficit;Dysarthria and anarthria (R47.1) Frontal lobe and executive function deficit following: Cerebral infarction Plan: [place], [place], [place] Pathologist [phone] Aliene Altes 11/18/2017, 9:21 AM

## 2017-11-19 ENCOUNTER — Inpatient Hospital Stay (HOSPITAL_COMMUNITY): Payer: Medicare Other

## 2017-11-19 LAB — CBC
HCT: 45.9 % (ref 39.0–52.0)
Hemoglobin: 15 g/dL (ref 13.0–17.0)
MCH: 29.4 pg (ref 26.0–34.0)
MCHC: 32.7 g/dL (ref 30.0–36.0)
MCV: 89.8 fL (ref 78.0–100.0)
PLATELETS: 173 10*3/uL (ref 150–400)
RBC: 5.11 MIL/uL (ref 4.22–5.81)
RDW: 12.9 % (ref 11.5–15.5)
WBC: 19.3 10*3/uL — ABNORMAL HIGH (ref 4.0–10.5)

## 2017-11-19 LAB — BASIC METABOLIC PANEL
Anion gap: 6 (ref 5–15)
BUN: 25 mg/dL — AB (ref 8–23)
CO2: 24 mmol/L (ref 22–32)
CREATININE: 0.91 mg/dL (ref 0.61–1.24)
Calcium: 8.8 mg/dL — ABNORMAL LOW (ref 8.9–10.3)
Chloride: 106 mmol/L (ref 98–111)
GFR calc Af Amer: >60 mL/min (ref >60)
GLUCOSE: 108 mg/dL — AB (ref 70–99)
POTASSIUM: 4 mmol/L (ref 3.5–5.1)
SODIUM: 136 mmol/L (ref 135–145)

## 2017-11-19 MED ORDER — QUETIAPINE FUMARATE 25 MG PO TABS
12.5000 mg | ORAL_TABLET | Freq: Two times a day (BID) | ORAL | Status: DC
  Administered 2017-11-19 – 2017-11-20 (×3): 12.5 mg via ORAL
  Filled 2017-11-19 (×2): qty 1

## 2017-11-19 MED ORDER — HYDROCHLOROTHIAZIDE 12.5 MG PO CAPS
12.5000 mg | ORAL_CAPSULE | Freq: Every day | ORAL | Status: DC
  Administered 2017-11-19 – 2017-11-21 (×3): 12.5 mg via ORAL
  Filled 2017-11-19 (×3): qty 1

## 2017-11-19 NOTE — Progress Notes (Signed)
Modified Barium Swallow Progress Note  Patient Details  Name: Dakota [name] MRN: [id number] Date of Birth: June 11, 1928  Today's Date: 11/19/2017  Modified Barium Swallow completed.  Full report located under Chart Review in the Imaging Section.  Brief recommendations include the following:  Clinical Impression  Patient presents with moderate oropharyngeal dysphagia due to cognitive, sensory and motor deficits. Pt's sustained attention impacted his awareness of boluses and his ability to follow simple commands/compensatory maneuvers during the assessment. Pt repeatedly distracted by noises made by fluoro machine, turning his head to the right repeatedly despite instruction and tactile cues. In general, pt's sensory deficits impact timing and awareness of boluses. Teaspoon improved sensation and timing of swallow. With cup sips (clinician assisted and self-retrieved), there is premature spillage and prolonged pooling in the pharynx (valleculae and pyriforms) without awareness, occasional mod-max A required to initiate swallow (verbal, tactile, dry spoon). Oral phase characterized by weak lingual manipulation, decreased bolus cohesion, poor oral containment with premature spillage with all liquids. Pharyngeal stage noted for weak tongue base retraction, reduced epiglottic deflection/laryngeal closure and decreased pharyngeal constriction, with reduced stripping of residue from the pharynx. Moderate residue in the valleculae and occasionally in pyriform sinuses. Pt cued for chin tuck several times; even when he did complete this manuever it did not have a significant impact on clearance of residue. Penetration and trace aspiration occurred with multiple cup sips of nectar, and pt at risk for aspiration with cup sips given the amount and duration of pooling in the pharynx prior to swallow initiation. Also suspect aspiration of teaspoon thin liquid (not captured on fluoro), as pt with wet coughing between  frames. Safest consistency/strategy appears to be dys 1, nectar thick liquids by teaspoon only. Pt will require full supervision due to level of cuing required. Slow rate, swallow 2x per bite/sip (dry spoon may be needed to cue), cue pt to cough/clear throat occasionally. He remains at moderate risk for aspiration given cognitive deficits and pharyngeal residue. Son present and SLP educated re: findings and strategies including reducing distractions at mealtimes. SLP will follow up   Swallow Evaluation Recommendations       SLP Diet Recommendations: Dysphagia 1 (Puree) solids;Nectar thick liquid   Liquid Administration via: Spoon;No straw   Medication Administration: Crushed with puree   Supervision: Full supervision/cueing for compensatory strategies   Compensations: Slow rate;Small sips/bites;Clear throat intermittently;Multiple dry swallows after each bite/sip   Postural Changes: Seated upright at 90 degrees   Oral Care Recommendations: Oral care BID   Other Recommendations: Order thickener from [place], [place], [place] Speech-Language Pathologist [place] 11/19/2017,1:12 PM

## 2017-11-19 NOTE — Progress Notes (Signed)
Taking over patient care as of 2300 7/21

## 2017-11-19 NOTE — Progress Notes (Signed)
STROKE TEAM PROGRESS NOTE   SUBJECTIVE (INTERVAL HISTORY) The patient's son was at the bedside.  The patient is still quite lethargic.  We will decrease his Seroquel dose.  He is scheduled for a swallowing evaluation today.  OBJECTIVE Temp:  [98 F (36.7 C)-98.9 F (37.2 C)] 98.7 F (37.1 C) (07/21 0731) Pulse Rate:  [32-78] 74 (07/21 0734) Cardiac Rhythm: Atrial fibrillation (07/21 0700) Resp:  [12-24] 19 (07/21 0734) BP: (136-183)/(77-115) 183/104 (07/21 0734) SpO2:  [92 %-97 %] 92 % (07/21 0734) Weight:  [188 lb 7.9 oz (85.5 kg)] 188 lb 7.9 oz (85.5 kg) (07/20 1810)  CBC:  Recent Labs  Lab 11/16/17 1134 11/17/17 0500 11/18/17 0439 11/19/17 0251  WBC 14.9* 15.1* 19.1* 19.3*  NEUTROABS 11.6* 12.8*  --   --   HGB 16.7 14.5 14.5 15.0  HCT 51.5 44.7 44.9 45.9  MCV 91.0 91.2 91.4 89.8  PLT 204 196 147* 426    Basic Metabolic Panel:  Recent Labs  Lab 11/18/17 0439 11/19/17 0251  NA 139 136  K 4.6 4.0  CL 110 106  CO2 22 24  GLUCOSE 143* 108*  BUN 27* 25*  CREATININE 1.05 0.91  CALCIUM 8.8* 8.8*    Lipid Panel:     Component Value Date/Time   CHOL 151 11/17/2017 0500   TRIG 61 11/17/2017 0500   HDL 37 (L) 11/17/2017 0500   CHOLHDL 4.1 11/17/2017 0500   VLDL 12 11/17/2017 0500   LDLCALC 102 (H) 11/17/2017 0500   HgbA1c:  Lab Results  Component Value Date   HGBA1C 6.1 (H) 11/17/2017   Urine Drug Screen: No results found for: LABOPIA, COCAINSCRNUR, LABBENZ, AMPHETMU, THCU, LABBARB  Alcohol Level No results found for: [place] I have personally reviewed the radiological images below and agree with the radiology interpretations.   DG Chest Portable 1 View 11/18/2017 IMPRESSION: Low lung volumes with cardiomegaly and mild increased vascular congestion.   Ct Angio Head W Or Wo Contrast Ct Angio Neck W Or Wo Contrast Ct Cerebral Perfusion W Contrast 11/16/2017 IMPRESSION:  1. Negative for large vessel occlusion. No discrete right MCA branch  occlusion is identified, but moderate to severe right M3 branch irregularity is noted.  2. CT perfusion suggests posterior right MCA territory penumbra, but does not detect core infarct with the standard parameters. However, a small 5 mL infarct is suggested with more sensitive CBF (<38%) parameter, located along the posterior operculum. Therefore, it is possible that the right lentiform finding is chronic.  3. Mild for age atherosclerosis in the head and neck with no hemodynamically significant arterial stenosis identified outside of the right M3 findings in #1.   Ct Head Wo Contrast 11/16/2017 IMPRESSION:  Acute to subacute infarct in the right basal ganglia, external capsule, and posterior operculum. Right distal MCA branch hyperdensity near the operculum likely reflects thrombus.    Transthoracic Echocardiogram  11/17/17 Study Conclusions - Left ventricle: The cavity size was normal. There was moderate   concentric hypertrophy. Systolic function was vigorous. The   estimated ejection fraction was in the range of 65% to 70%. Wall   motion was normal; there were no regional wall motion   abnormalities. - Aortic valve: Trileaflet; mildly thickened, mildly calcified   leaflets. There was mild regurgitation. - Aortic root: The aortic root was normal in size. - Mitral valve: Calcified annulus. Mildly thickened leaflets .   There was mild regurgitation. - Left atrium: The atrium was moderately dilated. - Right ventricle: Systolic function  was normal. - Right atrium: The atrium was moderately dilated. - Tricuspid valve: There was mild regurgitation. - Pulmonary arteries: Systolic pressure was within the normal   range. - Inferior vena cava: The vessel was normal in size. - Pericardium, extracardiac: There was no pericardial effusion. Impressions: - No cardiac source of emboli was indentified.   CT Head WO Contrast 11/17/2017 IMPRESSION: 1. Propagation of acute RIGHT MCA territory  infarct now involving RIGHT insula and RIGHT temporoparietal lobes. Similar RIGHT basal ganglia acute infarct. No hemorrhagic conversion. 2. Dense distal RIGHT insular MCA consistent with thromboembolism.    Cerebral angiogram and Intervention - Dr Estanislado Pandy 11/16/2017 S/P RT common carotid arteriogram followed by endovascular revascularization of occluded RT MCA inf division distal M2 seg  With x 1 pass with  With 45mm x 20 mm trevoprovue retriever device achieving a TICI 2bplus revascularization.   PHYSICAL EXAM  Vitals:   11/19/17 0003 11/19/17 0326 11/19/17 0731 11/19/17 0734  BP: 136/77 (!) 158/98 (!) 178/111 (!) 183/104  Pulse:    74  Resp:  (!) 24  19  Temp: 98.9 F (37.2 C) 98 F (36.7 C) 98.7 F (37.1 C)   TempSrc: Axillary Axillary Axillary   SpO2: 94% 96%  92%  Weight:      Height:        General - Frail elderly Caucasian male in no apparent distress.  Ophthalmologic - fundi not visualized due to noncooperation.  Cardiovascular - irregularly irregular heart rate and rhythm.  Neuro - drowsy but easily arousable, oriented to place and people, but not to time. Severe dysarthria but able to name and repeat. Following commands. PERRL, right gaze preference, not crossing midline. Left neglect, not blinking to visual threat on the left. Left facial droop. Tongue midline. Left UE 1/5 and LE 2/5 on pain stimulation. RUE and RLE 4/5. DTR 1+ and left babinski positive. Sensation symmetrical, coordination not cooperative and gait not tested.   ASSESSMENT/PLAN Mr. NENO HOHENSEE is a 84 y.o. male with history of permanent atrial fibrillation (not anticoagulated ), status post [employer] permanent pacemaker for complete heart block, hypertension, hyperlipidemia, chronic back pain, and anxiety presenting with left-sided weakness and right gaze preference. He did not receive IV t-PA due to late presentation. S/P thrombectomy Dr Estanislado Pandy.  Stroke: right MCA infarct, embolic secondary  to atrial fibrillation not on AC.  Resultant  Left neglect, right gaze and left hemiparesis  CT head - Acute to subacute infarct in the right basal ganglia, external capsule, and posterior operculum. Right distal MCA branch hyperdensity near the operculum likely reflects thrombus.   CTA H&N - likely right M3 occlusion.   CT Perfusion - suggests posterior right MCA territory penumbra - (?? At least partial pseudo-normalization, exact size of penumbra is not clear)  DSA - right M2 occlusion s/p IR with TICI 2b+ reperfusion  MRI head - not able to do due to PPM  Repeat Head CT - Propagation of acute RT MCA territory infarct now involving RT insula and RT temporoparietal lobes. Similar RT basal ganglia acute infarct. Dense distal RT insular MCA c/w thromboembolism.  2D Echo - EF 65 to 70%.  No cardiac source of emboli identified.  LDL - 102  HgbA1c - 6.1  VTE prophylaxis - lovenox Diet Order           DIET - DYS 1 Room service appropriate? Yes; Fluid consistency: Honey Thick  Diet effective now          aspirin 81  mg daily prior to admission, now on aspirin suppository 300 mg daily.  Ongoing aggressive stroke risk factor management  Therapy recommendations:  CIR recommended - the patient has been seen by Dr. Tessa Lerner.  Disposition:  Pending  Afib not on AC  Hx of afib following with cardiology  Refused coumadin in the past due to ? Coumadin made leg weak  On ASA for now  Consider DOAC and timing based on MRI or CT repeat later.  In hospital delirium  Hx of in hospital delirium  High risk for delirium  Put on seroquel 25mg  bid once pass swallow  Non-pharmacological means first  Consider Haldol IV if needed  Hypertension  Stable on Cardene . BP goal < 140 after IR procedure . Wean off cardene  . Long-term BP goal normotensive  Hyperlipidemia  Lipid lowering medication PTA:  none  LDL 102, goal < 70  Current lipid lowering medication: NPO -add statin  with p.o. access  Continue statin at discharge  Dysphagia   Severe dysarthria  Speech pending  If not passing swallow, pt agreed with cortrak.  Chewing tobacco  Put on nicotine patch  Could be part of the reason of delirium  Other Stroke Risk Factors  Advanced age  Former cigarette smoker - quit  Other Active Problems  Complete heart block s/p Permanent pacemaker  Renal insufficiency BUN 26 ; creatinine 1.26 -> daily labs  Leukocytosis -> 19.1 (Tmax yesterday 100.6 - afebrile today) Currently not on abxs. CXR - yesterday - no pneumonia. WBCs today 19.3.  Afebrile.  BP a little high. HCTZ PTA. Will resume.    PLAN  Modified barium swallow with speech therapy today. May need CorTrak tube placement.  CIR when stable.  Hospital day # [place] PA-C [facility] Pager [phone] 11/19/2017, 11:38 AM I have personally examined this patient, reviewed notes, independently viewed imaging studies, participated in medical decision making and plan of care.ROS completed by me personally and pertinent positives fully documented  I have made any additions or clarifications directly to the above note. Agree with note above. Recommend reduce dose of Seroquel. Mobilize out of bed. Repeat well. Hopefully transferred to inpatient rehabilitation in the next few days. Discussed with patient and son and answered questions. Greater than 50% time during this 25 minute visit was spent on counseling and coordination of care about his stroke and answering questions  [name], [place] Pager: [phone] 11/19/2017 2:10 PM   To contact Stroke Continuity provider, please refer to [email or web address] After hours, contact General Neurology

## 2017-11-20 ENCOUNTER — Encounter (HOSPITAL_COMMUNITY): Payer: Self-pay | Admitting: Interventional Radiology

## 2017-11-20 LAB — BASIC METABOLIC PANEL
Anion gap: 7 (ref 5–15)
BUN: 26 mg/dL — AB (ref 8–23)
CHLORIDE: 103 mmol/L (ref 98–111)
CO2: 24 mmol/L (ref 22–32)
Calcium: 9.1 mg/dL (ref 8.9–10.3)
Creatinine, Ser: 0.92 mg/dL (ref 0.61–1.24)
GFR calc Af Amer: >60 mL/min (ref >60)
GFR calc non Af Amer: >60 mL/min (ref >60)
GLUCOSE: 129 mg/dL — AB (ref 70–99)
POTASSIUM: 4 mmol/L (ref 3.5–5.1)
Sodium: 134 mmol/L — ABNORMAL LOW (ref 135–145)

## 2017-11-20 LAB — CBC
HEMATOCRIT: 48.8 % (ref 39.0–52.0)
HEMOGLOBIN: 16.8 g/dL (ref 13.0–17.0)
MCH: 29.9 pg (ref 26.0–34.0)
MCHC: 34.4 g/dL (ref 30.0–36.0)
MCV: 87 fL (ref 78.0–100.0)
PLATELETS: 179 10*3/uL (ref 150–400)
RBC: 5.61 MIL/uL (ref 4.22–5.81)
RDW: 12.5 % (ref 11.5–15.5)
WBC: 16.9 10*3/uL — ABNORMAL HIGH (ref 4.0–10.5)

## 2017-11-20 MED ORDER — CARVEDILOL 12.5 MG PO TABS
25.0000 mg | ORAL_TABLET | Freq: Two times a day (BID) | ORAL | Status: DC
  Administered 2017-11-20 – 2017-11-21 (×3): 25 mg via ORAL
  Filled 2017-11-20 (×3): qty 2

## 2017-11-20 MED ORDER — FENTANYL CITRATE (PF) 100 MCG/2ML IJ SOLN
12.5000 ug | Freq: Once | INTRAMUSCULAR | Status: AC
  Administered 2017-11-20: 12.5 ug via INTRAVENOUS
  Filled 2017-11-20: qty 2

## 2017-11-20 MED ORDER — LABETALOL HCL 5 MG/ML IV SOLN
10.0000 mg | Freq: Once | INTRAVENOUS | Status: AC
  Administered 2017-11-20: 10 mg via INTRAVENOUS
  Filled 2017-11-20: qty 4

## 2017-11-20 MED ORDER — LABETALOL HCL 5 MG/ML IV SOLN
20.0000 mg | INTRAVENOUS | Status: DC | PRN
  Administered 2017-11-20 (×2): 20 mg via INTRAVENOUS
  Filled 2017-11-20 (×2): qty 4

## 2017-11-20 MED ORDER — QUETIAPINE FUMARATE 25 MG PO TABS
25.0000 mg | ORAL_TABLET | Freq: Every day | ORAL | Status: DC
  Administered 2017-11-20: 25 mg via ORAL
  Filled 2017-11-20: qty 1

## 2017-11-20 MED ORDER — QUETIAPINE FUMARATE 25 MG PO TABS
12.5000 mg | ORAL_TABLET | Freq: Every day | ORAL | Status: DC
  Administered 2017-11-21: 12.5 mg via ORAL
  Filled 2017-11-20: qty 1

## 2017-11-20 NOTE — Care Management Important Message (Signed)
Important Message  Patient Details  Name: Dakota [name] MRN: [id number] Date of Birth: 07/14/1928   Medicare Important Message Given:  Yes    Jayley Hustead 11/20/2017, 3:09 PM

## 2017-11-20 NOTE — Progress Notes (Signed)
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 Estanislado Pandy. Patient awake and alert laying in bed with no complaints at this time. Accompanied by two sons at bedside. Patient's condition stable at this time- can spontaneously move all extremities, demonstrates slurred speech and right gaze preference. Right groin incision c/d/i.   Allergies: Warfarin and related; Atorvastatin; Ezetimibe; Diazepam; and Penicillins  Medications: Prior to Admission medications   Medication Sig Start Date End Date Taking? Authorizing Provider  ALPRAZolam (XANAX) 0.5 MG tablet TAKE ONE-HALF TABLET BY MOUTH TWICE DAILY AS NEEDED FOR ANXIETY Patient taking differently: Take 0.25 mg by mouth 2 (two) times daily as needed for anxiety. TAKE ONE-HALF TABLET BY MOUTH TWICE DAILY AS NEEDED FOR ANXIETY 10/14/17  Yes Tonia Ghent, MD  aspirin 81 MG tablet Take 81 mg by mouth 2 (two) times daily.    Yes [provider]  calcium carbonate (TUMS - DOSED IN MG ELEMENTAL CALCIUM) 500 MG chewable tablet Chew 1 tablet by mouth daily as needed for indigestion.    Yes [provider]  carvedilol (COREG) 25 MG tablet TAKE 1 TABLET BY MOUTH TWICE DAILY WITH  A  MEAL. 08/03/17  Yes Josue Hector, MD  docusate sodium (COLACE) 100 MG capsule Take 1 capsule (100 mg total) by mouth daily as needed for mild constipation. 01/23/17  Yes Tonia Ghent, MD  hydrochlorothiazide (MICROZIDE) 12.5 MG capsule TAKE ONE CAPSULE BY MOUTH ONCE DAILY 07/27/17  Yes Josue Hector, MD  Magnesium Hydroxide (MILK OF MAGNESIA PO) Take 1 tablet by mouth daily as needed.   Yes [provider]  nitroGLYCERIN (NITROSTAT) 0.4 MG SL tablet Place 1 tablet (0.4 mg total) under the tongue every 5 (five) minutes as needed for chest  pain. 01/07/15  Yes Josue Hector, MD  pantoprazole (PROTONIX) 40 MG tablet Take 1 tablet (40 mg total) by mouth daily. 01/06/16  Yes Domenic Moras, PA-C  ranitidine (ZANTAC) 150 MG tablet TAKE 1 TABLET BY MOUTH ONCE DAILY 07/27/17  Yes Josue Hector, MD     Vital Signs: BP (!) 180/125 (BP Location: Left Arm)   Pulse 95   Temp 98.2 F (36.8 C) (Axillary)   Resp (!) 24   Ht 5\' 7"  (1.702 m)   Wt 188 lb 7.9 oz (85.5 kg)   SpO2 94%   BMI 29.52 kg/m   Physical Exam  Constitutional: He appears well-developed and well-nourished. No distress.  Cardiovascular: Normal rate, regular rhythm and normal heart sounds.  No murmur heard. Pulmonary/Chest: Effort normal and breath sounds normal. No respiratory distress. He has no wheezes.  Neurological:  Drowsy but easily arousable, oriented to person and place but not time. Able to follow commands but demonstrates severe dysarthria but able to name and repeat. PERRL bilaterally. Demonstrates right gaze preference. Visual fields not assessed. Demonstrates left facial droop. Tongue midline. Can spontaneously move all extremities. Pronator drift not assessed. Fine motor and coordination not assessed. Gait not assessed. Romberg not assessed. Heel to toe not assessed. Distal pulses 2+ bilaterally.  Skin: Skin is warm and dry.  Right groin incision soft without active bleeding and hematoma.  Psychiatric: He has a normal mood and affect. His behavior is normal. Judgment and thought content normal.  Nursing note and vitals reviewed.   Imaging: Ct Angio Head W Or Wo  Contrast  Result Date: 11/16/2017 CLINICAL DATA:  84 year old male code stroke. Altered mental status. Evidence of right basal ganglia/MCA lacunar and possible hyperdense right MCA on noncontrast head CT 1309 hours today. EXAM: CT ANGIOGRAPHY HEAD AND NECK CT PERFUSION BRAIN TECHNIQUE: Multidetector CT imaging of the head and neck was performed using the standard protocol during bolus  administration of intravenous contrast. Multiplanar CT image reconstructions and MIPs were obtained to evaluate the vascular anatomy. Carotid stenosis measurements (when applicable) are obtained utilizing NASCET criteria, using the distal internal carotid diameter as the denominator. Multiphase CT imaging of the brain was performed following IV bolus contrast injection. Subsequent parametric perfusion maps were calculated using RAPID software. CONTRAST:  84mL ISOVUE-370 IOPAMIDOL (ISOVUE-370) INJECTION 76% COMPARISON:  Head CT without contrast 1309 hours today. FINDINGS: CT Brain Perfusion Findings: ASPECTS approximately 7 on the earlier noncontrast CT. CBF (<30%) Volume: 0 milliliters (CBF less than 38% the techs 5 milliliters along the posterior operculum). Perfusion (Tmax>6.0s) volume: 41 milliliters Mismatch Volume: 41 milliliters Infarction Location:Posterior right MCA territory. Other findings:  Some motion artifact noted CTA NECK Skeleton: Absent dentition. No acute osseous abnormality identified. Upper chest: Left chest cardiac pacemaker type device. Mild dependent pulmonary atelectasis. No superior mediastinal lymphadenopathy. Other neck: Negative.  No neck mass or lymphadenopathy. Aortic arch: 3 vessel arch configuration. Mild to moderate arch atherosclerosis. Right carotid system: No brachiocephalic or right CCA origin stenosis. Minimal right CCA plaque including at the right carotid bifurcation. Mild soft plaque at the right ICA origin. No cervical right ICA stenosis. Left carotid system: No left CCA origin stenosis. Minimal left CCA plaque including at the left carotid bifurcation. Mild proximal left ICA plaque with no stenosis. Vertebral arteries: No proximal right subclavian artery stenosis. Normal right vertebral artery origin. Patent right vertebral artery to the skull base without stenosis. No proximal left subclavian artery stenosis despite mild plaque. Soft and calcified plaque at the left  vertebral artery origin and proximal V1 segment (series 10, image 29), with only mild stenosis. Codominant vertebral arteries, the left is patent to the skull base with mild V3 segment plaque but no stenosis. CTA HEAD Posterior circulation: Codominant distal vertebral arteries are patent without stenosis. Patent PICA origins and vertebrobasilar junction. Patent basilar artery without stenosis. Normal SCA and left PCA origin. Tortuous right P1 segment and right posterior communicating artery. The left Posterior communicating artery is diminutive or absent. Bilateral PCA branches are within normal limits. Anterior circulation: Both ICA siphons are patent. There is only minor siphon calcified plaque, greater on the left. Normal ophthalmic and right posterior communicating artery origins. Patent carotid termini. Normal MCA and ACA origins. Mildly tortuous A1 segments. Anterior communicating artery and bilateral ACA branches are within normal limits. Left MCA M1 segment is tortuous. Left MCA bifurcation is patent without stenosis. Left MCA branches are within normal limits. Right MCA M1 segment is tortuous but patent without stenosis. Right MCA bifurcation is patent. No right MCA M2 branch occlusion identified. Areas of moderate to severe right M3 branch irregularity are noted on series 14, image 15. Venous sinuses: Patent. Anatomic variants: None. Review of the MIP images confirms the above findings IMPRESSION: 1. Negative for large vessel occlusion. No discrete right MCA branch occlusion is identified, but moderate to severe right M3 branch irregularity is noted. 2. CT perfusion suggests posterior right MCA territory penumbra, but does not detect core infarct with the standard parameters. However, a small 5 mL infarct is suggested with more sensitive CBF (<38%) parameter, located  along the posterior operculum. Therefore, it is possible that the right lentiform finding is chronic. 3. Mild for age atherosclerosis in the  head and neck with no hemodynamically significant arterial stenosis identified outside of the right M3 findings in #1. 4.  Study discussed by telephone on 11/16/2017 at 15:19 to Dr. Rory Percy. Electronically Signed   By: Genevie Ann M.D.   On: 11/16/2017 15:20   Ct Head Wo Contrast  Result Date: 11/17/2017 CLINICAL DATA:  Delirium, somnolent. Assessment for stroke or bleed. Status post endovascular revascularization of occluded RIGHT M2 segment. EXAM: CT HEAD WITHOUT CONTRAST TECHNIQUE: Contiguous axial images were obtained from the base of the skull through the vertex without intravenous contrast. COMPARISON:  CT HEAD November 16, 2017. FINDINGS: BRAIN: No intraparenchymal hemorrhage, mass effect nor midline shift. New RIGHT temporoparietal and RIGHT insula wedge-like hypodensity with loss of gray-white matter differentiation. Evolving acute RIGHT basal ganglia infarct. Patchy to confluent supratentorial white matter hypodensities. No parenchymal brain volume loss for age. Cavum septum pellucidum. Patchy to confluent supratentorial white matter hypodensities compatible with moderate chronic small vessel ischemic changes. No abnormal extra-axial fluid collections. VASCULAR: Dense distal RIGHT insular MCA (sagittal image 21). Mild calcific atherosclerosis of the carotid siphons. SKULL: No skull fracture. Subcentimeter probable bone island RIGHT clivus. Mild similar LEFT frontal and LEFT parietal scalp hematomas. SINUSES/ORBITS: Lobulated sphenoid ethmoidal mucosal thickening without paranasal sinus air-fluid levels. Mastoid air cells are well aerated.The included ocular globes and orbital contents are non-suspicious. Status post LEFT ocular lens implant. OTHER: None. IMPRESSION: 1. Propagation of acute RIGHT MCA territory infarct now involving RIGHT insula and RIGHT temporoparietal lobes. Similar RIGHT basal ganglia acute infarct. No hemorrhagic conversion. 2. Dense distal RIGHT insular MCA consistent with thromboembolism. 3.  These results will be called to the ordering clinician or representative by the Radiologist Assistant, and communication documented in the PACS or zVision Dashboard. Electronically Signed   By: Elon Alas M.D.   On: 11/17/2017 17:29   Ct Head Wo Contrast  Result Date: 11/16/2017 CLINICAL DATA:  Altered mental status. EXAM: CT HEAD WITHOUT CONTRAST TECHNIQUE: Contiguous axial images were obtained from the base of the skull through the vertex without intravenous contrast. COMPARISON:  None. FINDINGS: Brain: Hypodensity within the right basal ganglia, external capsule, and posterior operculum. No evidence of hemorrhage, hydrocephalus, extra-axial collection or mass lesion/mass effect. Moderate age related cerebral atrophy. Scattered moderate periventricular and subcortical white matter hypodensities are nonspecific, but favored to reflect chronic microvascular ischemic changes. Vascular: Right distal MCA branch hyperdensity near the operculum likely reflecting thrombus (series 5, image 46). Atherosclerotic vascular calcification of the carotid siphons. Skull: Negative for fracture or focal lesion. Sinuses/Orbits: No acute finding. Other: None. IMPRESSION: 1. Acute to subacute infarct in the right basal ganglia, external capsule, and posterior operculum. Right distal MCA branch hyperdensity near the operculum likely reflects thrombus. These results were called by telephone at the time of interpretation on 11/16/2017 at 1:47 pm to Dr. Carmin Muskrat , who verbally acknowledged these results. Electronically Signed   By: Titus Dubin M.D.   On: 11/16/2017 13:49   Ct Angio Neck W Or Wo Contrast  Result Date: 11/16/2017 CLINICAL DATA:  84 year old male code stroke. Altered mental status. Evidence of right basal ganglia/MCA lacunar and possible hyperdense right MCA on noncontrast head CT 1309 hours today. EXAM: CT ANGIOGRAPHY HEAD AND NECK CT PERFUSION BRAIN TECHNIQUE: Multidetector CT imaging of the head  and neck was performed using the standard protocol during bolus administration of intravenous  contrast. Multiplanar CT image reconstructions and MIPs were obtained to evaluate the vascular anatomy. Carotid stenosis measurements (when applicable) are obtained utilizing NASCET criteria, using the distal internal carotid diameter as the denominator. Multiphase CT imaging of the brain was performed following IV bolus contrast injection. Subsequent parametric perfusion maps were calculated using RAPID software. CONTRAST:  27mL ISOVUE-370 IOPAMIDOL (ISOVUE-370) INJECTION 76% COMPARISON:  Head CT without contrast 1309 hours today. FINDINGS: CT Brain Perfusion Findings: ASPECTS approximately 7 on the earlier noncontrast CT. CBF (<30%) Volume: 0 milliliters (CBF less than 38% the techs 5 milliliters along the posterior operculum). Perfusion (Tmax>6.0s) volume: 41 milliliters Mismatch Volume: 41 milliliters Infarction Location:Posterior right MCA territory. Other findings:  Some motion artifact noted CTA NECK Skeleton: Absent dentition. No acute osseous abnormality identified. Upper chest: Left chest cardiac pacemaker type device. Mild dependent pulmonary atelectasis. No superior mediastinal lymphadenopathy. Other neck: Negative.  No neck mass or lymphadenopathy. Aortic arch: 3 vessel arch configuration. Mild to moderate arch atherosclerosis. Right carotid system: No brachiocephalic or right CCA origin stenosis. Minimal right CCA plaque including at the right carotid bifurcation. Mild soft plaque at the right ICA origin. No cervical right ICA stenosis. Left carotid system: No left CCA origin stenosis. Minimal left CCA plaque including at the left carotid bifurcation. Mild proximal left ICA plaque with no stenosis. Vertebral arteries: No proximal right subclavian artery stenosis. Normal right vertebral artery origin. Patent right vertebral artery to the skull base without stenosis. No proximal left subclavian artery stenosis  despite mild plaque. Soft and calcified plaque at the left vertebral artery origin and proximal V1 segment (series 10, image 29), with only mild stenosis. Codominant vertebral arteries, the left is patent to the skull base with mild V3 segment plaque but no stenosis. CTA HEAD Posterior circulation: Codominant distal vertebral arteries are patent without stenosis. Patent PICA origins and vertebrobasilar junction. Patent basilar artery without stenosis. Normal SCA and left PCA origin. Tortuous right P1 segment and right posterior communicating artery. The left Posterior communicating artery is diminutive or absent. Bilateral PCA branches are within normal limits. Anterior circulation: Both ICA siphons are patent. There is only minor siphon calcified plaque, greater on the left. Normal ophthalmic and right posterior communicating artery origins. Patent carotid termini. Normal MCA and ACA origins. Mildly tortuous A1 segments. Anterior communicating artery and bilateral ACA branches are within normal limits. Left MCA M1 segment is tortuous. Left MCA bifurcation is patent without stenosis. Left MCA branches are within normal limits. Right MCA M1 segment is tortuous but patent without stenosis. Right MCA bifurcation is patent. No right MCA M2 branch occlusion identified. Areas of moderate to severe right M3 branch irregularity are noted on series 14, image 15. Venous sinuses: Patent. Anatomic variants: None. Review of the MIP images confirms the above findings IMPRESSION: 1. Negative for large vessel occlusion. No discrete right MCA branch occlusion is identified, but moderate to severe right M3 branch irregularity is noted. 2. CT perfusion suggests posterior right MCA territory penumbra, but does not detect core infarct with the standard parameters. However, a small 5 mL infarct is suggested with more sensitive CBF (<38%) parameter, located along the posterior operculum. Therefore, it is possible that the right lentiform  finding is chronic. 3. Mild for age atherosclerosis in the head and neck with no hemodynamically significant arterial stenosis identified outside of the right M3 findings in #1. 4.  Study discussed by telephone on 11/16/2017 at 15:19 to Dr. Rory Percy. Electronically Signed   By: Herminio Heads.D.  On: 11/16/2017 15:20   [place]  Result Date: 11/20/2017 INDICATION: Acute onset of left-sided weakness, right gaze deviation. NIH score of 12. Occluded distal inferior division of the right middle cerebral artery in the M2 M3 junction. EXAM: 1. EMERGENT LARGE VESSEL OCCLUSION THROMBOLYSIS (anterior CIRCULATION) COMPARISON:  CT angiogram of the head and neck of 11/16/2017. MEDICATIONS: Vancomycin 1 g IV antibiotic was administered within 1 hour of the procedure. ANESTHESIA/SEDATION: General anesthesia CONTRAST:  Isovue 300 approximately 100 mL. FLUOROSCOPY TIME:  Fluoroscopy Time: 28 minutes 30 seconds (2675 mGy). COMPLICATIONS: None immediate. TECHNIQUE: Following a full explanation of the procedure along with the potential associated complications, an informed witnessed consent was obtained from the patient's son. The risks of intracranial hemorrhage of 10%, worsening neurological deficit, ventilator dependency, death and inability to revascularize were all reviewed in detail with the patient's son. The patient was then put under general anesthesia by the Department of Anesthesiology at [facility]. The right groin was prepped and draped in the usual sterile fashion. Thereafter using modified Seldinger technique, transfemoral access into the right common femoral artery was obtained without difficulty. Over a 0.035 inch guidewire a 5 French Pinnacle sheath was inserted. Through this, and also over a 0.035 inch guidewire a 5 [place] JB 1 catheter was advanced to the aortic arch region and selectively positioned in the right subclavian artery and the right common carotid artery. FINDINGS: The right subclavian  arteriogram demonstrates the right vertebral artery origin to be widely patent. The vessel is seen to opacify to the cranial skull base where it opacifies the right posterior-inferior cerebellar artery. Distal opacification into the right vertebrobasilar junction is also seen. The right common carotid arteriogram demonstrates the right external carotid artery and its major branches to be widely patent. The right internal carotid artery at the bulb demonstrates a smooth shallow plaque along the posterior wall without associated significant stenosis. More distally, the vessel is seen to opacify to the cranial skull base. The petrous, the cavernous and the supraclinoid segments demonstrate wide patency. The right middle cerebral artery and the right anterior cerebral artery opacify into the capillary and venous phases. The arterial phase demonstrates occlusion of a prominent inferior division of the right middle cerebral artery in the M2 M3 region. On the delayed arterial phase a moderate-sized hypoperfused area is seen correlating with the Penumb[place] suspected on the rapid perfusion study. PROCEDURE: ENDOVASCULAR COMPLETE REVASCULARIZATION OF OCCLUDED PROMINENT DISTAL INFERIOR DIVISION BRANCH OF THE RIGHT MIDDLE CEREBRAL ARTERY IN THE M2 M3 REGION WITH 1 PASS USING THE TREVO PROVUE 3 MM X 20 MM RETRIEVAL DEVICE ACHIEVING A TICI 2B REPERFUSION The diagnostic JB 1 catheter in the right common carotid artery was exchanged over a 300 cm Rosen exchange guidewire for a 55 cm 8 French neurovascular sheath using biplane roadmap technique and constant fluoroscopic guidance. Good aspiration was obtained from the hub of the neurovascular sheath. This was then connected to continuous heparinized saline infusion. Over the [facility] exchange guidewire, an 85 cm 8 [place] FlowGate balloon guide catheter which had been prepped with 50% contrast and 50% heparinized saline infusion was advanced and positioned just inside the right internal  carotid artery. The guidewire was removed. Good aspiration obtained from the hub of the [facility] guide catheter. A control arteriogram performed through this demonstrated no evidence of dissections or of occlusions. Distal intracranial circulation remained unchanged. At this time, in a coaxial manner and with constant heparinized saline infusion using biplane roadmap technique and constant  fluoroscopic guidance, over a 0.014 inch Soft-tip Synchro micro guidewire, a combination of a Navien 125 5 [place] guide catheter inside of which was an 021 Trevo ProVue microcatheter was advanced to the distal end of the [facility] guide catheter in the right internal carotid artery proximal 1/3. With the micro guidewire leading with a J-tip configuration, the combination was navigated without difficulty to the supraclinoid right ICA. A torque device was used to advance the micro guidewire into the right middle cerebral artery followed by the microcatheter. This combination was then navigated without difficulty through the inferior division branch into the M2 M3 occluded branch followed by the microcatheter. The guidewire was removed. Good aspiration obtained from the hub of the microcatheter. A gentle contrast injection demonstrated wide patency of the branch occlusion. This was then connected to continuous heparinized saline infusion. A Trevo ProVue retrieval device measuring 3 mm x 20 mm was then advanced to the distal end of the microcatheter using biplane roadmap technique and constant fluoroscopic guidance in a coaxial manner and with constant heparinized saline infusion. The distal and the proximal landing zones were then defined. The O ring on the delivery microcatheter was then loosened. With slight forward gentle traction with the right hand on the delivery micro guidewire, with the left hand the delivery microcatheter was then retrieved unsheathing the distal and then the proximal portion of the retrieval device. At this  time, the Navien 5 [place] guide catheter was advanced into the proximal right middle cerebral M1 segment. Proximal flow arrest was then initiated by inflating the balloon of the [facility] guide catheter in the right internal carotid artery. Thereafter, the microcatheter which was advanced just proximal to the retrieval device was then retrieved in combination with the retrieval device and the 5 [place] Navien guide catheter as constant aspiration was applied with a 60 mL syringe at the hub of the [facility] guide catheter, and with a 20 mL syringe at the hub of the 5 [place] Navien guide catheter. The combination was then retrieved and removed with aspirations continuing at the hub of the [place] guide catheter with a 60 mL syringe. Aspiration was continued as the balloon in the right internal carotid artery was then deflated. Brisk back bleed at the hub of the [employer] was seen. A control arteriogram performed through the right internal carotid artery demonstrated near complete revascularization of the previously occluded inferior division of the M2 M3 region. Spasm was seen at the proximal right internal carotid artery and also the right middle cerebral M1 segment. Approximately 3 aliquots of 25 mcg of nitroglycerin were then infused into the right internal carotid artery via the FlowGate guide catheter with progressive relief of the vasospasm. Improved perfusion was seen in the previously noted occluded right middle cerebral artery inferior division. The retrieval device revealed the presence of approximately 2.5 mm x 1.5 mm clot entangled in the struts of the retrieval device. A final control arteriogram performed through the [facility] guide catheter in the right internal carotid artery demonstrated continued near complete revascularization of the previously noted occluded inferior division branch of the right middle cerebral artery in the M2 M3 region. A TICI 2b revascularization had been achieved. No evidence of  extravasation, mass effect or midline shift was noted. Throughout the procedure, the patient's hemodynamic status was controlled appropriately. The 8 [place] FlowGate guide catheter was then retrieved into the abdominal aorta as well as the 8 [place] Brite tip neurovascular sheath. These were then in turn exchanged over a J-tip  guidewire for an 8 [place] Pinnacle sheath. This in turn was then removed with the successful application of an ExoSeal 7 French closure device with good hemostasis. No evidence of a hematoma or bleeding was seen. The patient's distal pulses in the dorsalis pedis, and the posterior tibials remained palpable in both feet unchanged from prior to the beginning of the procedure. The patient's Dyna CT revealed no gross evidence of a hemorrhage or mass effect or midline shift. There was a focal area of contrast stain in the right mid parietal subcortical region. The patient's general anesthesia was then reversed and the patient was then extubated without difficulty. Upon recovery, the patient was able to obey simple commands without difficulty. He was able to bend his left knee. His left arm was slow to respond. IMPRESSION: Status post endovascular near complete revascularization of the occluded prominent inferior division of the left middle cerebral artery in the M2 M3 region with 1 pass with the 3 mm x 20 mm Trevo ProVue retrieval device achieving a TICI 2b revascularization. PLAN: The patient was then transferred to PACU and then to neuro ICU to continue with further stroke management. Electronically Signed   By: Luanne Bras M.D.   On: 11/17/2017 15:46   Ct Cerebral Perfusion W Contrast  Result Date: 11/16/2017 CLINICAL DATA:  84 year old male code stroke. Altered mental status. Evidence of right basal ganglia/MCA lacunar and possible hyperdense right MCA on noncontrast head CT 1309 hours today. EXAM: CT ANGIOGRAPHY HEAD AND NECK CT PERFUSION BRAIN TECHNIQUE: Multidetector CT imaging of  the head and neck was performed using the standard protocol during bolus administration of intravenous contrast. Multiplanar CT image reconstructions and MIPs were obtained to evaluate the vascular anatomy. Carotid stenosis measurements (when applicable) are obtained utilizing NASCET criteria, using the distal internal carotid diameter as the denominator. Multiphase CT imaging of the brain was performed following IV bolus contrast injection. Subsequent parametric perfusion maps were calculated using RAPID software. CONTRAST:  90mL ISOVUE-370 IOPAMIDOL (ISOVUE-370) INJECTION 76% COMPARISON:  Head CT without contrast 1309 hours today. FINDINGS: CT Brain Perfusion Findings: ASPECTS approximately 7 on the earlier noncontrast CT. CBF (<30%) Volume: 0 milliliters (CBF less than 38% the techs 5 milliliters along the posterior operculum). Perfusion (Tmax>6.0s) volume: 41 milliliters Mismatch Volume: 41 milliliters Infarction Location:Posterior right MCA territory. Other findings:  Some motion artifact noted CTA NECK Skeleton: Absent dentition. No acute osseous abnormality identified. Upper chest: Left chest cardiac pacemaker type device. Mild dependent pulmonary atelectasis. No superior mediastinal lymphadenopathy. Other neck: Negative.  No neck mass or lymphadenopathy. Aortic arch: 3 vessel arch configuration. Mild to moderate arch atherosclerosis. Right carotid system: No brachiocephalic or right CCA origin stenosis. Minimal right CCA plaque including at the right carotid bifurcation. Mild soft plaque at the right ICA origin. No cervical right ICA stenosis. Left carotid system: No left CCA origin stenosis. Minimal left CCA plaque including at the left carotid bifurcation. Mild proximal left ICA plaque with no stenosis. Vertebral arteries: No proximal right subclavian artery stenosis. Normal right vertebral artery origin. Patent right vertebral artery to the skull base without stenosis. No proximal left subclavian artery  stenosis despite mild plaque. Soft and calcified plaque at the left vertebral artery origin and proximal V1 segment (series 10, image 29), with only mild stenosis. Codominant vertebral arteries, the left is patent to the skull base with mild V3 segment plaque but no stenosis. CTA HEAD Posterior circulation: Codominant distal vertebral arteries are patent without stenosis. Patent PICA origins and vertebrobasilar  junction. Patent basilar artery without stenosis. Normal SCA and left PCA origin. Tortuous right P1 segment and right posterior communicating artery. The left Posterior communicating artery is diminutive or absent. Bilateral PCA branches are within normal limits. Anterior circulation: Both ICA siphons are patent. There is only minor siphon calcified plaque, greater on the left. Normal ophthalmic and right posterior communicating artery origins. Patent carotid termini. Normal MCA and ACA origins. Mildly tortuous A1 segments. Anterior communicating artery and bilateral ACA branches are within normal limits. Left MCA M1 segment is tortuous. Left MCA bifurcation is patent without stenosis. Left MCA branches are within normal limits. Right MCA M1 segment is tortuous but patent without stenosis. Right MCA bifurcation is patent. No right MCA M2 branch occlusion identified. Areas of moderate to severe right M3 branch irregularity are noted on series 14, image 15. Venous sinuses: Patent. Anatomic variants: None. Review of the MIP images confirms the above findings IMPRESSION: 1. Negative for large vessel occlusion. No discrete right MCA branch occlusion is identified, but moderate to severe right M3 branch irregularity is noted. 2. CT perfusion suggests posterior right MCA territory penumbra, but does not detect core infarct with the standard parameters. However, a small 5 mL infarct is suggested with more sensitive CBF (<38%) parameter, located along the posterior operculum. Therefore, it is possible that the right  lentiform finding is chronic. 3. Mild for age atherosclerosis in the head and neck with no hemodynamically significant arterial stenosis identified outside of the right M3 findings in #1. 4.  Study discussed by telephone on 11/16/2017 at 15:19 to Dr. Rory Percy. Electronically Signed   By: Genevie Ann M.D.   On: 11/16/2017 15:20   Dg Chest Port 1 View  Result Date: 11/18/2017 CLINICAL DATA:  Dysphagia, fever, leukocytosis EXAM: PORTABLE CHEST 1 VIEW COMPARISON:  11/16/2017 FINDINGS: Similar low lung volumes. Cardiomegaly with mild vascular congestion. No focal pneumonia, collapse or consolidation. No large effusion or pneumothorax. Exam is rotated to the left. Aorta is atherosclerotic. Single lead left subclavian pacer noted. IMPRESSION: Low lung volumes with cardiomegaly and mild increased vascular congestion. Electronically Signed   By: Jerilynn Mages.  Shick M.D.   On: 11/18/2017 13:44   Dg Chest Port 1 View  Result Date: 11/16/2017 CLINICAL DATA:  Encounter for AMS and SOB EXAM: PORTABLE CHEST - 1 VIEW COMPARISON:  01/06/2016 FINDINGS: Relatively low lung volumes with crowding of bronchovascular structures. No confluent airspace infiltrate or overt edema. Heart size upper limits normal for technique. No effusion or pneumothorax. Stable left subclavian single lead transvenous pacemaker extending towards the right ventricular apex. Lumbar fixation hardware partially visualized. IMPRESSION: No acute cardiopulmonary disease. Electronically Signed   By: Lucrezia Europe M.D.   On: 11/16/2017 11:49   Dg Swallowing Func-speech Pathology  Result Date: 11/19/2017 Objective Swallowing Evaluation: Type of Study: MBS-Modified Barium Swallow Study  Patient Details Name: Dakota [name] MRN: [id number] Date of Birth: 10/04/1928 Today's Date: 11/19/2017 Time: SLP Start Time (ACUTE ONLY): 1210 -SLP Stop Time (ACUTE ONLY): 1248 SLP Time Calculation (min) (ACUTE ONLY): 38 min Past Medical History: Past Medical History: Diagnosis Date . Anxiety  .  Back pain, chronic  . Complete heart block ([place]) 07/18/2014  Medtronic [place] model [id number] (serial number [id number])  singe lead PPM . Esophageal obstruction due to food impaction 2017 . GERD (gastroesophageal reflux disease) 10/2002 . Hyperlipidemia 12/1994 . Hypertension  . Hypokalemia  . Lung collapse 03/1991  Fall . Permanent atrial fibrillation ([name])   Refused coumadin therapy Past Surgical History: Past  Surgical History: Procedure Laterality Date . BACK SURGERY  11/91  with hardware fixation . CARDIAC CATHETERIZATION  06/2004  30 % stenosis, EF normal . ESOPHAGOGASTRODUODENOSCOPY N/A 01/06/2016  Procedure: ESOPHAGOGASTRODUODENOSCOPY (EGD);  Surgeon: Otis Brace, MD;  Location: [place];  Service: Gastroenterology;  Laterality: N/A; . EYE SURGERY    Retinal bubble surgery, cataract  . LEFT HEART CATHETERIZATION WITH CORONARY ANGIOGRAM N/A 07/18/2014  Procedure: LEFT HEART CATHETERIZATION WITH CORONARY ANGIOGRAM;  Surgeon: Jettie Booze, MD; [facility] [place], LAD mild dz, D1 80%, D2 OK, CFX system OK, RCA OK, PDA 100%, med rx . PERMANENT PACEMAKER INSERTION N/A 07/18/2014  Procedure: PERMANENT PACEMAKER INSERTION;  Surgeon: Thompson Grayer, MD; Medtronic Blue model [id number] (serial number [id number])   . RADIOLOGY WITH ANESTHESIA N/A 11/16/2017  Procedure: RADIOLOGY WITH ANESTHESIA;  Surgeon: Luanne Bras, MD;  Location: [place];  Service: Radiology;  Laterality: N/A; . TEMPORARY PACEMAKER INSERTION N/A 07/18/2014  Procedure: TEMPORARY PACEMAKER INSERTION;  Surgeon: Peter M Martinique, MD;  Location: [facility] CATH LAB;  Service: Cardiovascular;  Laterality: N/A; HPI: Dakota [name] is a 84 y.o. male with history of permanent atrial fibrillation (not anticoagulated ), status post Medtronic permanent pacemaker for complete heart block, hypertension, hyperlipidemia, chronic back pain, and anxiety presenting with left-sided weakness and right gaze preference. He did not receive IV t-PA due to late presentation. S/P  thrombectomy Dr Estanislado Pandy. CT head - Acute to subacute infarct in the right basal ganglia, external capsule, and posterior operculum. Right distal MCA branch hyperdensity near the operculum likely reflects thrombus.  Subjective: Alert, distractible Assessment / Plan / Recommendation CHL IP CLINICAL IMPRESSIONS 11/19/2017 Clinical Impression Patient presents with moderate oropharyngeal dysphagia due to cognitive, sensory and motor deficits. Pt's sustained attention impacted his awareness of boluses and his ability to follow simple commands/compensatory maneuvers during the assessment. Pt repeatedly distracted by noises made by fluoro machine, turning his head to the right repeatedly despite instruction and tactile cues. In general, pt's sensory deficits impact timing and awareness of boluses. Teaspoon improved sensation and timing of swallow. With cup sips (clinician assisted and self-retrieved), there is premature spillage and prolonged pooling in the pharynx (valleculae and pyriforms) without awareness, occasional mod-max A required to initiate swallow (verbal, tactile, dry spoon). Oral phase characterized by weak lingual manipulation, decreased bolus cohesion, poor oral containment with premature spillage with all liquids. Pharyngeal stage noted for weak tongue base retraction, reduced epiglottic deflection/laryngeal closure and decreased pharyngeal constriction, with reduced stripping of residue from the pharynx. Moderate residue in the valleculae and occasionally in pyriform sinuses. Pt cued for chin tuck several times; even when he did complete this manuever it did not have a significant impact on clearance of residue. Penetration and trace aspiration occurred with multiple cup sips of nectar, and pt at risk for aspiration with cup sips given the amount and duration of pooling in the pharynx prior to swallow initiation. Also suspect aspiration of teaspoon thin liquid (not captured on fluoro), as pt with wet  coughing between frames. Safest consistency/strategy appears to be dys 1, nectar thick liquids by teaspoon only. Pt will require full supervision due to level of cuing required. Slow rate, swallow 2x per bite/sip (dry spoon may be needed to cue), cue pt to cough/clear throat occasionally. He remains at moderate risk for aspiration given cognitive deficits and pharyngeal residue. Son present and SLP educated re: findings and strategies including reducing distractions at mealtimes. SLP will follow up SLP Visit Diagnosis Dysphagia, oral phase (R13.11);Dysphagia, oropharyngeal phase (R13.12);Dysphagia,  pharyngeal phase (R13.13) Attention and concentration deficit following -- Frontal lobe and executive function deficit following -- Impact on safety and function Moderate aspiration risk   CHL IP TREATMENT RECOMMENDATION 11/19/2017 Treatment Recommendations F/U MBS in --- days (Comment);Therapy as outlined in treatment plan below   Prognosis 11/19/2017 Prognosis for Safe Diet Advancement Good Barriers to Reach Goals Cognitive deficits Barriers/Prognosis Comment -- CHL IP DIET RECOMMENDATION 11/19/2017 SLP Diet Recommendations Dysphagia 1 (Puree) solids;Nectar thick liquid Liquid Administration via Spoon;No straw Medication Administration Crushed with puree Compensations Slow rate;Small sips/bites;Clear throat intermittently;Multiple dry swallows after each bite/sip Postural Changes Seated upright at 90 degrees   CHL IP OTHER RECOMMENDATIONS 11/19/2017 Recommended Consults -- Oral Care Recommendations Oral care BID Other Recommendations Order thickener from pharmacy   CHL IP FOLLOW UP RECOMMENDATIONS 11/19/2017 Follow up Recommendations Skilled Nursing facility   [facility] IP FREQUENCY AND DURATION 11/19/2017 Speech Therapy Frequency (ACUTE ONLY) min 2x/week Treatment Duration 2 weeks      CHL IP ORAL PHASE 11/19/2017 Oral Phase Impaired Oral - Pudding Teaspoon -- Oral - Pudding Cup -- Oral - Honey Teaspoon Weak lingual  manipulation;Premature spillage;Decreased bolus cohesion Oral - Honey Cup -- Oral - Nectar Teaspoon Weak lingual manipulation;Decreased bolus cohesion;Premature spillage Oral - Nectar Cup Weak lingual manipulation;Premature spillage;Decreased bolus cohesion Oral - Nectar Straw -- Oral - Thin Teaspoon Weak lingual manipulation;Decreased bolus cohesion;Premature spillage Oral - Thin Cup -- Oral - Thin Straw -- Oral - Puree Weak lingual manipulation;Reduced posterior propulsion;Decreased bolus cohesion Oral - Mech Soft -- Oral - Regular Impaired mastication;Weak lingual manipulation;Reduced posterior propulsion;Holding of bolus;Pocketing in anterior sulcus;Decreased bolus cohesion;Delayed oral transit Oral - Multi-Consistency -- Oral - Pill -- Oral Phase - Comment --  CHL IP PHARYNGEAL PHASE 11/19/2017 Pharyngeal Phase Impaired Pharyngeal- Pudding Teaspoon -- Pharyngeal -- Pharyngeal- Pudding Cup -- Pharyngeal -- Pharyngeal- Honey Teaspoon Delayed swallow initiation-pyriform sinuses;Reduced pharyngeal peristalsis;Reduced epiglottic inversion;Reduced airway/laryngeal closure;Reduced tongue base retraction;Pharyngeal residue - valleculae;Pharyngeal residue - pyriform Pharyngeal -- Pharyngeal- Honey Cup -- Pharyngeal -- Pharyngeal- Nectar Teaspoon Delayed swallow initiation-pyriform sinuses;Reduced pharyngeal peristalsis;Reduced epiglottic inversion;Reduced airway/laryngeal closure;Reduced tongue base retraction;Penetration/Apiration after swallow;Pharyngeal residue - valleculae;Pharyngeal residue - pyriform Pharyngeal Material does not enter airway Pharyngeal- Nectar Cup Delayed swallow initiation-pyriform sinuses;Reduced pharyngeal peristalsis;Reduced epiglottic inversion;Reduced airway/laryngeal closure;Reduced tongue base retraction;Penetration/Apiration after swallow;Trace aspiration;Pharyngeal residue - valleculae;Pharyngeal residue - pyriform Pharyngeal Material enters airway, remains ABOVE vocal cords and not  ejected out;Material enters airway, passes BELOW cords without attempt by patient to eject out (silent aspiration) Pharyngeal- Nectar Straw -- Pharyngeal -- Pharyngeal- Thin Teaspoon Delayed swallow initiation-pyriform sinuses;Reduced pharyngeal peristalsis;Reduced epiglottic inversion;Reduced airway/laryngeal closure;Reduced tongue base retraction;Penetration/Apiration after swallow;Pharyngeal residue - valleculae;Pharyngeal residue - pyriform Pharyngeal (No Data) Pharyngeal- Thin Cup -- Pharyngeal -- Pharyngeal- Thin Straw -- Pharyngeal -- Pharyngeal- Puree Reduced pharyngeal peristalsis;Reduced epiglottic inversion;Delayed swallow initiation-vallecula;Reduced tongue base retraction;Pharyngeal residue - valleculae Pharyngeal -- Pharyngeal- Mechanical Soft -- Pharyngeal -- Pharyngeal- Regular Reduced pharyngeal peristalsis;Reduced epiglottic inversion;Reduced tongue base retraction;Pharyngeal residue - valleculae Pharyngeal -- Pharyngeal- Multi-consistency -- Pharyngeal -- Pharyngeal- Pill -- Pharyngeal -- Pharyngeal Comment --  CHL IP CERVICAL ESOPHAGEAL PHASE 11/19/2017 Cervical Esophageal Phase WFL Pudding Teaspoon -- Pudding Cup -- Honey Teaspoon -- Honey Cup -- Nectar Teaspoon -- Nectar Cup -- Nectar Straw -- Thin Teaspoon -- Thin Cup -- Thin Straw -- Puree -- Mechanical Soft -- Regular -- Multi-consistency -- Pill -- Cervical Esophageal Comment -- Deneise Lever, MS, CCC-SLP Speech-Language Pathologist [phone] No flowsheet data found. Aliene Altes 11/19/2017, 1:12 PM  Ir Percutaneous Art Thrombectomy/infusion Intracranial Inc Diag Angio  Result Date: 11/20/2017 INDICATION: Acute onset of left-sided weakness, right gaze deviation. NIH score of 12. Occluded distal inferior division of the right middle cerebral artery in the M2 M3 junction. EXAM: 1. EMERGENT LARGE VESSEL OCCLUSION THROMBOLYSIS (anterior CIRCULATION) COMPARISON:  CT angiogram of the head and neck of 11/16/2017. MEDICATIONS:  Vancomycin 1 g IV antibiotic was administered within 1 hour of the procedure. ANESTHESIA/SEDATION: General anesthesia CONTRAST:  Isovue 300 approximately 100 mL. FLUOROSCOPY TIME:  Fluoroscopy Time: 28 minutes 30 seconds (2675 mGy). COMPLICATIONS: None immediate. TECHNIQUE: Following a full explanation of the procedure along with the potential associated complications, an informed witnessed consent was obtained from the patient's son. The risks of intracranial hemorrhage of 10%, worsening neurological deficit, ventilator dependency, death and inability to revascularize were all reviewed in detail with the patient's son. The patient was then put under general anesthesia by the Department of Anesthesiology at [facility]. The right groin was prepped and draped in the usual sterile fashion. Thereafter using modified Seldinger technique, transfemoral access into the right common femoral artery was obtained without difficulty. Over a 0.035 inch guidewire a 5 French Pinnacle sheath was inserted. Through this, and also over a 0.035 inch guidewire a 5 [place] JB 1 catheter was advanced to the aortic arch region and selectively positioned in the right subclavian artery and the right common carotid artery. FINDINGS: The right subclavian arteriogram demonstrates the right vertebral artery origin to be widely patent. The vessel is seen to opacify to the cranial skull base where it opacifies the right posterior-inferior cerebellar artery. Distal opacification into the right vertebrobasilar junction is also seen. The right common carotid arteriogram demonstrates the right external carotid artery and its major branches to be widely patent. The right internal carotid artery at the bulb demonstrates a smooth shallow plaque along the posterior wall without associated significant stenosis. More distally, the vessel is seen to opacify to the cranial skull base. The petrous, the cavernous and the supraclinoid segments demonstrate  wide patency. The right middle cerebral artery and the right anterior cerebral artery opacify into the capillary and venous phases. The arterial phase demonstrates occlusion of a prominent inferior division of the right middle cerebral artery in the M2 M3 region. On the delayed arterial phase a moderate-sized hypoperfused area is seen correlating with the Penumbra suspected on the rapid perfusion study. PROCEDURE: ENDOVASCULAR COMPLETE REVASCULARIZATION OF OCCLUDED PROMINENT DISTAL INFERIOR DIVISION BRANCH OF THE RIGHT MIDDLE CEREBRAL ARTERY IN THE M2 M3 REGION WITH 1 PASS USING THE TREVO PROVUE 3 MM X 20 MM RETRIEVAL DEVICE ACHIEVING A TICI 2B REPERFUSION The diagnostic JB 1 catheter in the right common carotid artery was exchanged over a 300 cm Rosen exchange guidewire for a 55 cm 8 French neurovascular sheath using biplane roadmap technique and constant fluoroscopic guidance. Good aspiration was obtained from the hub of the neurovascular sheath. This was then connected to continuous heparinized saline infusion. Over the [facility] exchange guidewire, an 85 cm 8 [place] FlowGate balloon guide catheter which had been prepped with 50% contrast and 50% heparinized saline infusion was advanced and positioned just inside the right internal carotid artery. The guidewire was removed. Good aspiration obtained from the hub of the [facility] guide catheter. A control arteriogram performed through this demonstrated no evidence of dissections or of occlusions. Distal intracranial circulation remained unchanged. At this time, in a coaxial manner and with constant heparinized saline infusion using biplane roadmap technique and constant fluoroscopic  guidance, over a 0.014 inch Soft-tip Synchro micro guidewire, a combination of a Navien 125 5 [place] guide catheter inside of which was an Motley microcatheter was advanced to the distal end of the [facility] guide catheter in the right internal carotid artery proximal 1/3. With the  micro guidewire leading with a J-tip configuration, the combination was navigated without difficulty to the supraclinoid right ICA. A torque device was used to advance the micro guidewire into the right middle cerebral artery followed by the microcatheter. This combination was then navigated without difficulty through the inferior division branch into the M2 M3 occluded branch followed by the microcatheter. The guidewire was removed. Good aspiration obtained from the hub of the microcatheter. A gentle contrast injection demonstrated wide patency of the branch occlusion. This was then connected to continuous heparinized saline infusion. A Trevo ProVue retrieval device measuring 3 mm x 20 mm was then advanced to the distal end of the microcatheter using biplane roadmap technique and constant fluoroscopic guidance in a coaxial manner and with constant heparinized saline infusion. The distal and the proximal landing zones were then defined. The O ring on the delivery microcatheter was then loosened. With slight forward gentle traction with the right hand on the delivery micro guidewire, with the left hand the delivery microcatheter was then retrieved unsheathing the distal and then the proximal portion of the retrieval device. At this time, the Navien 5 [place] guide catheter was advanced into the proximal right middle cerebral M1 segment. Proximal flow arrest was then initiated by inflating the balloon of the [facility] guide catheter in the right internal carotid artery. Thereafter, the microcatheter which was advanced just proximal to the retrieval device was then retrieved in combination with the retrieval device and the 5 [place] Navien guide catheter as constant aspiration was applied with a 60 mL syringe at the hub of the [facility] guide catheter, and with a 20 mL syringe at the hub of the 5 [place] Navien guide catheter. The combination was then retrieved and removed with aspirations continuing at the hub of the  [facility] guide catheter with a 60 mL syringe. Aspiration was continued as the balloon in the right internal carotid artery was then deflated. Brisk back bleed at the hub of the [employer] was seen. A control arteriogram performed through the right internal carotid artery demonstrated near complete revascularization of the previously occluded inferior division of the M2 M3 region. Spasm was seen at the proximal right internal carotid artery and also the right middle cerebral M1 segment. Approximately 3 aliquots of 25 mcg of nitroglycerin were then infused into the right internal carotid artery via the FlowGate guide catheter with progressive relief of the vasospasm. Improved perfusion was seen in the previously noted occluded right middle cerebral artery inferior division. The retrieval device revealed the presence of approximately 2.5 mm x 1.5 mm clot entangled in the struts of the retrieval device. A final control arteriogram performed through the [facility] guide catheter in the right internal carotid artery demonstrated continued near complete revascularization of the previously noted occluded inferior division branch of the right middle cerebral artery in the M2 M3 region. A TICI 2b revascularization had been achieved. No evidence of extravasation, mass effect or midline shift was noted. Throughout the procedure, the patient's hemodynamic status was controlled appropriately. The 8 [place] FlowGate guide catheter was then retrieved into the abdominal aorta as well as the 8 [place] Brite tip neurovascular sheath. These were then in turn exchanged over a J-tip guidewire  for an 8 [place] Pinnacle sheath. This in turn was then removed with the successful application of an ExoSeal 7 French closure device with good hemostasis. No evidence of a hematoma or bleeding was seen. The patient's distal pulses in the dorsalis pedis, and the posterior tibials remained palpable in both feet unchanged from prior to the beginning of  the procedure. The patient's Dyna CT revealed no gross evidence of a hemorrhage or mass effect or midline shift. There was a focal area of contrast stain in the right mid parietal subcortical region. The patient's general anesthesia was then reversed and the patient was then extubated without difficulty. Upon recovery, the patient was able to obey simple commands without difficulty. He was able to bend his left knee. His left arm was slow to respond. IMPRESSION: Status post endovascular near complete revascularization of the occluded prominent inferior division of the left middle cerebral artery in the M2 M3 region with 1 pass with the 3 mm x 20 mm Trevo ProVue retrieval device achieving a TICI 2b revascularization. PLAN: The patient was then transferred to PACU and then to neuro ICU to continue with further stroke management. Electronically Signed   By: Luanne Bras M.D.   On: 11/17/2017 15:46    Labs:  CBC: Recent Labs    11/17/17 0500 11/18/17 0439 11/19/17 0251 11/20/17 0439  WBC 15.1* 19.1* 19.3* 16.9*  HGB 14.5 14.5 15.0 16.8  HCT 44.7 44.9 45.9 48.8  PLT 196 147* 173 179    COAGS: No results for input(s): INR, APTT in the last 8760 hours.  BMP: Recent Labs    11/17/17 0500 11/18/17 0439 11/19/17 0251 11/20/17 0439  NA 139 139 136 134*  K 4.4 4.6 4.0 4.0  CL 109 110 106 103  CO2 22 22 24 24   GLUCOSE 184* 143* 108* 129*  BUN 24* 27* 25* 26*  CALCIUM 8.5* 8.8* 8.8* 9.1  CREATININE 1.26* 1.05 0.91 0.92  GFRNONAA 50* >60 >60 >60  GFRAA 58* >60 >60 >60    LIVER FUNCTION TESTS: Recent Labs    01/23/17 1531 11/16/17 1134  BILITOT 0.6 1.8*  AST 20 27  ALT 19 21  ALKPHOS 53 59  PROT 7.4 7.6  ALBUMIN 3.8 4.0    Assessment and Plan:  Right MCA inferior division distal M2 segment occlusion s/p revascularization 11/16/2017 with Dr. Estanislado Pandy. Patient's condition stable at this time- can spontaneously move all extremities, demonstrates slurred speech and right  gaze preference. Right groin incision stable. Appreciate and agree with neurology management.  Electronically Signed: Earley Abide, PA-C 11/20/2017, 11:18 AM   I spent a total of 25 Minutes at the the patient's bedside AND on the patient's hospital floor or unit, greater than 50% of which was counseling/coordinating care for right MCA inferior division distal M2 segment occlusion s/p revascularization.

## 2017-11-20 NOTE — Progress Notes (Signed)
Physical Therapy Treatment Patient Details Name: Dakota [name] MRN: [id number] DOB: 04/03/1929 Today's Date: 11/20/2017    History of Present Illness 84 yo admitted with left weakness and right gaze preference with right basal ganglia infarct s/p revascularization. 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, atrial fibrillation    PT Comments    Patient received in bed. PT session focusing on improving safe functional mobility. Able to attend to L visual field for brief periods but does require consistent verbal cueing for this. Requiring Max/Total A+2 for bed mobility and transfers to recliner as well as assist for positioning due to L lateral lean. PT recommendations remain appropriate with PT to continue to follow.     Follow Up Recommendations  CIR;Supervision/Assistance - 24 hour     Equipment Recommendations  (TBD)    Recommendations for Other Services       Precautions / Restrictions Precautions Precautions: Fall Precaution Comments: right gaze, left hemiparesis Restrictions Weight Bearing Restrictions: No    Mobility  Bed Mobility Overal bed mobility: Needs Assistance Bed Mobility: Rolling;Sidelying to Sit Rolling: Total assist;+2 for physical assistance Sidelying to sit: Total assist;+2 for physical assistance       General bed mobility comments: Total A + 2 to come to EOB; requires intiation by PT to bring LE towards EOB; bed pad used for hip/trunk rotation; does shift hips at bedside   Transfers Overall transfer level: Needs assistance Equipment used: 2 person hand held assist Transfers: Sit to/from [facility] Sit to Stand: Max assist;+2 physical assistance Stand pivot transfers: Max assist;+2 physical assistance       General transfer comment: L knee blocking required; max cueing for safety and sequencing with limited carryover;   Ambulation/Gait                 Stairs             Wheelchair  Mobility    Modified Rankin (Stroke Patients Only) Modified Rankin (Stroke Patients Only) Pre-Morbid Rankin Score: No symptoms Modified Rankin: Severe disability     Balance Overall balance assessment: Needs assistance Sitting-balance support: Single extremity supported;Feet supported Sitting balance-Leahy Scale: Poor Sitting balance - Comments: Initially max assist +2 for stability. Able to progress toward min guard assist briefly.  Postural control: Posterior lean;Left lateral lean Standing balance support: Single extremity supported Standing balance-Leahy Scale: Poor Standing balance comment: bil UE support in standing and assist for balance and stability                             Cognition Arousal/Alertness: Awake/alert Behavior During Therapy: Restless Overall Cognitive Status: Impaired/Different from baseline Area of Impairment: Orientation;Attention;Memory;Following commands;Safety/judgement;Awareness;Problem solving                 Orientation Level: Disoriented to;Time;Place Current Attention Level: Focused Memory: Decreased short-term memory Following Commands: Follows one step commands inconsistently;Follows one step commands with increased time Safety/Judgement: Decreased awareness of safety;Decreased awareness of deficits Awareness: Intellectual Problem Solving: Slow processing;Decreased initiation;Difficulty sequencing;Requires verbal cues;Requires tactile cues General Comments: able to follow some commands with tactile and verbal cueing to ellicit appropriate responses      Exercises      General Comments General comments (skin integrity, edema, etc.): BP stable throughout; SpO2 desat to 88% for brief period - good recovery with PLB      Pertinent Vitals/Pain Pain Assessment: No/denies pain    Home Living  Prior Function            PT Goals (current goals can now be found in the care plan section)  Acute Rehab PT Goals Patient Stated Goal: per son, to be independent PT Goal Formulation: With family Time For Goal Achievement: 12/01/17 Potential to Achieve Goals: Good Progress towards PT goals: Progressing toward goals    Frequency    Min 4X/week      PT Plan Current plan remains appropriate    Co-evaluation PT/OT/SLP Co-Evaluation/Treatment: Yes Reason for Co-Treatment: Necessary to address cognition/behavior during functional activity;Complexity of the patient's impairments (multi-system involvement);For patient/therapist safety PT goals addressed during session: Mobility/safety with mobility        AM-PAC PT "6 Clicks" Daily Activity  Outcome Measure  Difficulty turning over in bed (including adjusting bedclothes, sheets and blankets)?: Unable Difficulty moving from lying on back to sitting on the side of the bed? : Unable Difficulty sitting down on and standing up from a chair with arms (e.g., wheelchair, bedside commode, etc,.)?: Unable Help needed moving to and from a bed to chair (including a wheelchair)?: A Lot Help needed walking in hospital room?: Total Help needed climbing 3-5 steps with a railing? : Total 6 Click Score: 7    End of Session Equipment Utilized During Treatment: Gait belt Activity Tolerance: Patient tolerated treatment well Patient left: in chair;with call bell/phone within reach;with chair alarm set;with family/visitor present Nurse Communication: Mobility status PT Visit Diagnosis: Other abnormalities of gait and mobility (R26.89);Other symptoms and signs involving the nervous system (R29.898);Hemiplegia and hemiparesis Hemiplegia - Right/Left: Left Hemiplegia - dominant/non-dominant: Non-dominant Hemiplegia - caused by: Cerebral infarction     Time: 2355-7322 PT Time Calculation (min) (ACUTE ONLY): 30 min  Charges:  $Therapeutic Activity: 8-22 mins                    G Codes:       Lanney Gins, PT, DPT 11/20/17 1:08  PM Pager: [phone]

## 2017-11-20 NOTE — Progress Notes (Addendum)
STROKE TEAM PROGRESS NOTE   SUBJECTIVE (INTERVAL HISTORY) The patient's son was at the bedside along with his nurse and multiple therapist.  He has been agitated throughout the night after his Seroquel dose was decreased.  With his agitation came increased blood pressure.  He was treated with labetalol to bring it down.  Given high blood pressure he was unable to work with therapy.  Medically, okay for discharge to rehab.  Will discuss with them about potential admission.   OBJECTIVE Temp:  [98 F (36.7 C)-98.5 F (36.9 C)] 98.2 F (36.8 C) (07/22 1317) Pulse Rate:  [55-159] 89 (07/22 1317) Cardiac Rhythm: Atrial fibrillation (07/22 1226) Resp:  [20-24] 22 (07/22 1317) BP: (109-186)/(62-127) 113/62 (07/22 1317) SpO2:  [92 %-98 %] 98 % (07/22 1316)  CBC:  Recent Labs  Lab 11/16/17 1134 11/17/17 0500  11/19/17 0251 11/20/17 0439  WBC 14.9* 15.1*   < > 19.3* 16.9*  NEUTROABS 11.6* 12.8*  --   --   --   HGB 16.7 14.5   < > 15.0 16.8  HCT 51.5 44.7   < > 45.9 48.8  MCV 91.0 91.2   < > 89.8 87.0  PLT 204 196   < > 173 179   < > = values in this interval not displayed.    Basic Metabolic Panel:  Recent Labs  Lab 11/19/17 0251 11/20/17 0439  NA 136 134*  K 4.0 4.0  CL 106 103  CO2 24 24  GLUCOSE 108* 129*  BUN 25* 26*  CREATININE 0.91 0.92  CALCIUM 8.8* 9.1    Lipid Panel:     Component Value Date/Time   CHOL 151 11/17/2017 0500   TRIG 61 11/17/2017 0500   HDL 37 (L) 11/17/2017 0500   CHOLHDL 4.1 11/17/2017 0500   VLDL 12 11/17/2017 0500   LDLCALC 102 (H) 11/17/2017 0500   HgbA1c:  Lab Results  Component Value Date   HGBA1C 6.1 (H) 11/17/2017    IMAGING  Ct Head Wo Contrast 11/16/2017 IMPRESSION:  Acute to subacute infarct in the right basal ganglia, external capsule, and posterior operculum. Right distal MCA branch hyperdensity near the operculum likely reflects thrombus.   Ct Angio Head W Or Wo Contrast Ct Angio Neck W Or Wo Contrast Ct Cerebral  Perfusion W Contrast 11/16/2017 IMPRESSION:  1. Negative for large vessel occlusion. No discrete right MCA branch occlusion is identified, but moderate to severe right M3 branch irregularity is noted.  2. CT perfusion suggests posterior right MCA territory penumbra, but does not detect core infarct with the standard parameters. However, a small 5 mL infarct is suggested with more sensitive CBF (<38%) parameter, located along the posterior operculum. Therefore, it is possible that the right lentiform finding is chronic.  3. Mild for age atherosclerosis in the head and neck with no hemodynamically significant arterial stenosis identified outside of the right M3 findings in #1.   Cerebral angiogram and Intervention - Dr Estanislado Pandy 11/16/2017 S/P RT common carotid arteriogram followed by endovascular revascularization of occluded RT MCA inf division distal M2 seg  With x 1 pass with  With 33mm x 20 mm trevoprovue retriever device achieving a TICI 2b plus revascularization.  CT Head WO Contrast 11/17/2017 IMPRESSION: 1. Propagation of acute RIGHT MCA territory infarct now involving RIGHT insula and RIGHT temporoparietal lobes. Similar RIGHT basal ganglia acute infarct. No hemorrhagic conversion. 2. Dense distal RIGHT insular MCA consistent with thromboembolism.  Transthoracic Echocardiogram  11/17/17 Study Conclusions - Left ventricle: The cavity  size was normal. There was moderate concentric hypertrophy. Systolic function was vigorous. The estimated ejection fraction was in the range of 65% to 70%. Wall motion was normal; there were no regional wall motion abnormalities. - Aortic valve: Trileaflet; mildly thickened, mildly calcified leaflets. There was mild regurgitation. - Aortic root: The aortic root was normal in size. - Mitral valve: Calcified annulus. Mildly thickened leaflets.  There was mild regurgitation. - Left atrium: The atrium was moderately dilated. - Right ventricle: Systolic function  was normal. - Right atrium: The atrium was moderately dilated. - Tricuspid valve: There was mild regurgitation. - Pulmonary arteries: Systolic pressure was within the normal range. - Inferior vena cava: The vessel was normal in size. - Pericardium, extracardiac: There was no pericardial effusion. Impressions:   No cardiac source of emboli was indentified.  DG Chest Portable 1 View 11/18/2017 IMPRESSION: Low lung volumes with cardiomegaly and mild increased vascular congestion.   PHYSICAL EXAM (Per Dr. Leonie Man) [place] elderly Caucasian male in no apparent distress.  Ophthalmologic - fundi not visualized due to noncooperation.  Cardiovascular - irregularly irregular heart rate and rhythm.  Neuro - drowsy but easily arousable, oriented to place and people, but not to time. Severe dysarthria but able to name and repeat. Following commands. PERRL, right gaze preference, not crossing midline. Left neglect, not blinking to visual threat on the left. Left facial droop. Tongue midline. Left UE 1/5 and LE 2/5 on pain stimulation. RUE and RLE 4/5. DTR 1+ and left babinski positive. Sensation symmetrical, coordination not cooperative and gait not tested.   ASSESSMENT/PLAN Dakota [name] is a 84 y.o. male with history of permanent atrial fibrillation (not anticoagulated ), status post [employer] permanent pacemaker for complete heart block, hypertension, hyperlipidemia, chronic back pain, and anxiety presenting with left-sided weakness and right gaze preference. He did not receive IV t-PA due to late presentation. S/P thrombectomy Dr Estanislado Pandy.  Stroke: right MCA infarct, embolic secondary to atrial fibrillation not on [facility] s/p TICI2b revascularization of R MCA M2  Resultant  Left neglect, right gaze and left hemiparesis  CT head - Acute to subacute infarct in the right basal ganglia, external capsule, and posterior operculum. Right distal MCA branch hyperdensity near the operculum likely  reflects thrombus.   CTA H&N - likely right M3 occlusion.   CT Perfusion - suggests posterior right MCA territory penumbra - (?? At least partial pseudo-normalization, exact size of penumbra is not clear)  DSA - right M2 occlusion s/p IR with TICI 2b+ reperfusion  MRI head - not able to do due to PPM  Repeat Head CT - Propagation of acute RT MCA territory infarct now involving RT insula and RT temporoparietal lobes. Similar RT basal ganglia acute infarct. Dense distal RT insular MCA c/w thromboembolism.  2D Echo - EF 65 to 70%.  No cardiac source of emboli identified.  LDL - 102  HgbA1c - 6.1  VTE prophylaxis - Lovenox 40 mg sq daily   aspirin 81 mg daily prior to admission, now on aspirin suppository 300 mg or aspiring 325 po daily.  Plan to start Eliquis 5 mg p.o. twice daily on hospital day 5 if remains stable  Ongoing aggressive stroke risk factor management  Therapy recommendations:  CIR. okay for stroke standpoint for patient to be discharged to rehab when bed available  Disposition:  Pending  Afib not on AC  Hx of afib following with cardiology  Refused coumadin in the past due to ? Coumadin made leg  weak  On ASA for now  Plan to start Eliquis 5 mg p.o. twice daily on hospital day 5 if remains stable  In hospital delirium  Hx of in hospital delirium  High risk for delirium  Put on seroquel 25mg  bid once pass swallow  Patient sedated on Seroquel 25 twice daily so dose decreased to 12.5 bid  Last night, On lower dose, patient now agitated and unable to participate in therapy due to elevated blood pressure.  Keep 12.5 mg every morning and increase dose back to 25 mg at bedtime  Hypertension . Initially treated with Cardene, now off  . BP goal < 180  . Home meds: HCTZ, resumed yesterday . Now on coreg 25 bid . Blood pressure elevated this morning.  Will treat with as needed labetalol . Long-term BP goal normotensive  Hyperlipidemia  Lipid lowering  medication PTA:  none  LDL 102, goal < 70  Current lipid lowering medication: NPO -add statin with p.o. access  Continue statin at discharge  Dysphagia   Severe dysarthria Diet Order           DIET - DYS 1 Room service appropriate? Yes; Fluid consistency: Nectar Thick  Diet effective now           Chewing tobacco  Put on nicotine patch  Could be part of the reason of delirium  Other Stroke Risk Factors  Advanced age  Former cigarette smoker - quit  Other Active Problems  Complete heart block s/p Permanent pacemaker  Renal insufficiency BUN 26 ; creatinine 0.92 -> improved  Leukocytosis -> 16.9 (afebrile today) not on abxs. CXR -  no pneumonia.   Hospital day # [place], MSN, APRN, ANVP-BC, AGPCNP-BC Advanced Practice Stroke Nurse [place] for Schedule & Pager information 11/20/2017 3:34 PM [place] his blood pressure and he was on hydrochlorothiazide at home and then it looks like may be new Coreg was added I have personally examined this patient, reviewed notes, independently viewed imaging studies, participated in medical decision making and plan of care.ROS completed by me personally and pertinent positives fully documented  I have made any additions or clarifications directly to the above note. Agree with note above. Greater than 50% time during this 25 minute visit was spent on counseling and coordination of care about his stroke and answered questions  [name], MD Medical Director [place] Pager: [phone] 11/20/2017 4:32 PM   To contact Stroke Continuity provider, please refer to [email or web address] After hours, contact General Neurology

## 2017-11-20 NOTE — Progress Notes (Signed)
Occupational Therapy Treatment Patient Details Name: Dakota [name] MRN: [id number] DOB: [birth year] Today's Date: 11/20/2017    History of present illness 84 yo admitted with left weakness and right gaze preference with right basal ganglia infarct s/p revascularization. 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 He was better able to attend to L visual field with maximum cues today. Pt requiring assistance for all aspects of ADL including total assist for LB ADL, toileting, and max assist +2 for stand-pivot simulated toilet transfers. Pt able to sit at EOB with max +2 assistance initially and progressing to min guard briefly. D/C recommendation remains appropriate. OT will continue to follow while admitted.    Follow Up Recommendations  CIR    Equipment Recommendations  3 in 1 bedside commode;Wheelchair (measurements OT);Wheelchair cushion (measurements OT);Hospital bed    Recommendations for Other Services Rehab consult    Precautions / Restrictions Precautions Precautions: Fall Precaution Comments: right gaze, left hemiparesis Restrictions Weight Bearing Restrictions: No       Mobility Bed Mobility Overal bed mobility: Needs Assistance Bed Mobility: Rolling;Sidelying to Sit Rolling: Total assist;+2 for physical assistance Sidelying to sit: Total assist;+2 for physical assistance       General bed mobility comments: Total assistance to come to EOB today.   Transfers Overall transfer level: Needs assistance Equipment used: 2 person hand held assist Transfers: Sit to/from [facility] Sit to Stand: +2 physical assistance;Max assist Stand pivot transfers: Max assist;+2 physical assistance       General transfer comment: Pt with L knee blocked able to pivot to the R from bed to chair with max assist +2 for lifting, balance and sequencing.      Balance Overall balance assessment: Needs assistance Sitting-balance support: Single extremity supported;Feet supported Sitting balance-Leahy Scale: Poor Sitting balance - Comments: Initially max assist +2 for stability. Able to progress toward min guard assist briefly.    Standing balance support: Single extremity supported Standing balance-Leahy Scale: Poor Standing balance comment: bil UE support in standing and assist for balance and stability                            ADL either performed or assessed with clinical judgement   ADL Overall ADL's : Needs assistance/impaired                     Lower Body Dressing: Total assistance;Bed level   Toilet Transfer: +2 for physical assistance;Maximal assistance   Toileting- Clothing Manipulation and Hygiene: Total assistance;Sit to/from stand         General ADL Comments: Simulated ADL participation this session. Pt able to cross midline gaze to hold for 10-15 seconds today.      Vision   Vision Assessment?: Yes Eye Alignment: Impaired (comment) Ocular Range of Motion: Restricted on the left Alignment/Gaze Preference: Head turned;Gaze right Tracking/Visual Pursuits: Other (comment)(able to track past midline to the L but not fully) Additional Comments: Pt with head position to the R. Able to track past midline to the L with auditory and visual cues. Pt able to sustain gaze out of midline for 10-15 seconds.    Perception     Praxis      Cognition Arousal/Alertness: Awake/alert Behavior During Therapy: Restless Overall Cognitive Status: Impaired/Different from baseline Area of Impairment: Orientation;Attention;Memory;Following commands;Safety/judgement;Awareness;Problem solving  Orientation Level: Disoriented to;Time;Place Current Attention Level: Focused(approaching sustained) Memory: Decreased short-term memory Following Commands: Follows one step commands  inconsistently Safety/Judgement: Decreased awareness of safety;Decreased awareness of deficits Awareness: Intellectual Problem Solving: Slow processing;Decreased initiation;Difficulty sequencing;Requires verbal cues;Requires tactile cues General Comments: Pt able to follow some commands today and attend briefly to eye contact and conversation.         Exercises     Shoulder Instructions       General Comments SpO2 desaturation to 88% and improved with pursed lip breathing to 94%. BP stable.     Pertinent Vitals/ Pain       Pain Assessment: No/denies pain  Home Living                                          Prior Functioning/Environment              Frequency  Min 3X/week        Progress Toward Goals  OT Goals(current goals can now be found in the care plan section)  Progress towards OT goals: Progressing toward goals  Acute Rehab OT Goals Patient Stated Goal: per son, to be independent OT Goal Formulation: With family Time For Goal Achievement: 12/01/17 Potential to Achieve Goals: Good  Plan Discharge plan remains appropriate    Co-evaluation                 AM-PAC PT "6 Clicks" Daily Activity     Outcome Measure   Help from another person eating meals?: Total Help from another person taking care of personal grooming?: A Lot Help from another person toileting, which includes using toliet, bedpan, or urinal?: Total Help from another person bathing (including washing, rinsing, drying)?: Total Help from another person to put on and taking off regular upper body clothing?: Total Help from another person to put on and taking off regular lower body clothing?: Total 6 Click Score: 7    End of Session Equipment Utilized During Treatment: Gait belt  OT Visit Diagnosis: Unsteadiness on feet (R26.81);Cognitive communication deficit (R41.841);Hemiplegia and hemiparesis Symptoms and signs involving cognitive functions: Cerebral  infarction Hemiplegia - Right/Left: Left Hemiplegia - dominant/non-dominant: Non-Dominant   Activity Tolerance Patient tolerated treatment well   Patient Left with call bell/phone within reach;with family/visitor present;in chair;with chair alarm set   Nurse Communication Mobility status;Precautions        Time: 7782-4235 OT Time Calculation (min): 30 min  Charges: OT General Charges $OT Visit: 1 Visit OT Treatments $Self Care/Home Management : 8-22 mins  Norman Herrlich, MS OTR/L  Pager: [place] [facility] [place] 11/20/2017, 12:13 PM

## 2017-11-20 NOTE — Progress Notes (Signed)
  Speech Language Pathology Treatment:    Patient Details Name: Dakota [name] MRN: [id number] DOB: 13-Apr-1929 Today's Date: 11/20/2017 Time: 7096-2836 SLP Time Calculation (min) (ACUTE ONLY): 11 min  Assessment / Plan / Recommendation Clinical Impression  Pt today is sleepy but did awaken adequately to take po medications crushed given by RN.  SLP assisted pt to consume apple juice via nectar tsp and applesauce from RN requiring max cues/dry spoon stimulation to elicit 2nd swallow.  Son reports pt ate icecream well yesterday *magic cup presumed.  No indication of airway compromise but mild delay in oral transiting noted however.  Pt continues with significant gaze preference to the right - will follow SLP visual cues to turn midline however.    Educated son to findings of MBS and procedure to thicken liquids. Posted swallow precaution sign in room for staff/pt/family.  Will follow up for dysphagia/dysarthria/cognitive linguistic deficits.   Thanks.   HPI HPI: [name] is a 84 y.o. male with history of permanent atrial fibrillation (not anticoagulated ), status post [employer] permanent pacemaker for complete heart block, hypertension, hyperlipidemia, chronic back pain, and anxiety presenting with left-sided weakness and right gaze preference. He did not receive IV t-PA due to late presentation. S/P thrombectomy Dr Estanislado Pandy. CT head - Acute to subacute infarct in the right basal ganglia, external capsule, and posterior operculum. Right distal MCA branch hyperdensity near the operculum likely reflects thrombus.   Today follow up for swallowing indicated as he is s/p MBS yesterday with penetration of nectar and suspected audible aspiration of thin.        SLP Plan  Continue with current plan of care       Recommendations  Diet recommendations: Nectar-thick liquid;Dysphagia 1 (puree) Liquids provided via: Teaspoon Medication Administration: Crushed with puree Supervision: Staff to  assist with self feeding Compensations: Slow rate;Small sips/bites;Multiple dry swallows after each bite/sip(use dry spoon to elicit dry swallow, start intake with liquids) Postural Changes and/or Swallow Maneuvers: Seated upright 90 degrees;Upright 30-60 min after meal                Oral Care Recommendations: Oral care QID Follow up Recommendations: Skilled Nursing facility SLP Visit Diagnosis: Dysphagia, oral phase (R13.11);Dysphagia, oropharyngeal phase (R13.12);Dysphagia, pharyngeal phase (R13.13) Plan: Continue with current plan of care       [place], [place] [facility] SLP [phone]  Macario Golds 11/20/2017, 11:16 AM

## 2017-11-20 NOTE — Progress Notes (Signed)
Inpatient Rehabilitation Admissions Coordinator  I met with patient up in chair with his son feeding him lunch. Son states pt has not slept since 2 pm yesterday, very restless today with noted elevated BP when restless. I have discussed with Burnetta Sabin [facility] her plan for change in meds. I will follow up with his process overnight with possible admit Tuesday. I discussed with bedside RN, Quillian Quince.  Danne Baxter, RN, MSN Rehab Admissions Coordinator [phone] 11/20/2017 12:22 PM

## 2017-11-20 NOTE — Progress Notes (Signed)
OT Cancellation Note  Patient Details Name: Dakota [name] MRN: [id number] DOB: July 10, 1928   Cancelled Treatment:    Reason Eval/Treat Not Completed: Medical issues which prohibited therapy. Family adamant that pt needs to get up this am. However, pt with elevated BP beyond protocol and unable to progress with session. Will check back as able. Of note, family reporting they will get pt up on their own if staff does not assist. Provided education that this is unsafe as pt requiring max-total assist of 2 trained individuals for safe mobility. Son verbalized understanding but continued to dismiss OT instructions.   Dakota Herrlich, MS OTR/L  Pager: [place] [facility] [place] 11/20/2017, 10:07 AM

## 2017-11-21 ENCOUNTER — Inpatient Hospital Stay (HOSPITAL_COMMUNITY)
Admission: RE | Admit: 2017-11-21 | Discharge: 2017-11-24 | DRG: 057 | Disposition: A | Discharge status: Other Acute Inpatient Hospital | Payer: Medicare Other | Source: Intra-hospital | Attending: Physical Medicine & Rehabilitation | Admitting: Physical Medicine & Rehabilitation

## 2017-11-21 ENCOUNTER — Inpatient Hospital Stay (HOSPITAL_COMMUNITY): Payer: Medicare Other

## 2017-11-21 ENCOUNTER — Encounter (HOSPITAL_COMMUNITY): Payer: Self-pay | Admitting: *Deleted

## 2017-11-21 DIAGNOSIS — F05 Delirium due to known physiological condition: Secondary | ICD-10-CM | POA: Diagnosis present

## 2017-11-21 DIAGNOSIS — Z8249 Family history of ischemic heart disease and other diseases of the circulatory system: Secondary | ICD-10-CM | POA: Diagnosis not present

## 2017-11-21 DIAGNOSIS — K219 Gastro-esophageal reflux disease without esophagitis: Secondary | ICD-10-CM | POA: Diagnosis present

## 2017-11-21 DIAGNOSIS — R7303 Prediabetes: Secondary | ICD-10-CM | POA: Diagnosis not present

## 2017-11-21 DIAGNOSIS — I69354 Hemiplegia and hemiparesis following cerebral infarction affecting left non-dominant side: Principal | ICD-10-CM

## 2017-11-21 DIAGNOSIS — R1311 Dysphagia, oral phase: Secondary | ICD-10-CM | POA: Diagnosis present

## 2017-11-21 DIAGNOSIS — I10 Essential (primary) hypertension: Secondary | ICD-10-CM | POA: Diagnosis not present

## 2017-11-21 DIAGNOSIS — Z950 Presence of cardiac pacemaker: Secondary | ICD-10-CM | POA: Diagnosis not present

## 2017-11-21 DIAGNOSIS — I69391 Dysphagia following cerebral infarction: Secondary | ICD-10-CM

## 2017-11-21 DIAGNOSIS — N390 Urinary tract infection, site not specified: Secondary | ICD-10-CM

## 2017-11-21 DIAGNOSIS — I63511 Cerebral infarction due to unspecified occlusion or stenosis of right middle cerebral artery: Secondary | ICD-10-CM | POA: Diagnosis present

## 2017-11-21 DIAGNOSIS — D72829 Elevated white blood cell count, unspecified: Secondary | ICD-10-CM

## 2017-11-21 DIAGNOSIS — I63311 Cerebral infarction due to thrombosis of right middle cerebral artery: Secondary | ICD-10-CM

## 2017-11-21 DIAGNOSIS — G4720 Circadian rhythm sleep disorder, unspecified type: Secondary | ICD-10-CM | POA: Diagnosis present

## 2017-11-21 DIAGNOSIS — Z6828 Body mass index (BMI) 28.0-28.9, adult: Secondary | ICD-10-CM | POA: Diagnosis not present

## 2017-11-21 DIAGNOSIS — I480 Paroxysmal atrial fibrillation: Secondary | ICD-10-CM | POA: Diagnosis present

## 2017-11-21 DIAGNOSIS — K922 Gastrointestinal hemorrhage, unspecified: Secondary | ICD-10-CM

## 2017-11-21 DIAGNOSIS — R410 Disorientation, unspecified: Secondary | ICD-10-CM

## 2017-11-21 DIAGNOSIS — R4189 Other symptoms and signs involving cognitive functions and awareness: Secondary | ICD-10-CM | POA: Diagnosis present

## 2017-11-21 DIAGNOSIS — K921 Melena: Secondary | ICD-10-CM | POA: Diagnosis not present

## 2017-11-21 DIAGNOSIS — F17220 Nicotine dependence, chewing tobacco, uncomplicated: Secondary | ICD-10-CM

## 2017-11-21 DIAGNOSIS — E46 Unspecified protein-calorie malnutrition: Secondary | ICD-10-CM | POA: Diagnosis not present

## 2017-11-21 DIAGNOSIS — R7989 Other specified abnormal findings of blood chemistry: Secondary | ICD-10-CM | POA: Diagnosis present

## 2017-11-21 DIAGNOSIS — I959 Hypotension, unspecified: Secondary | ICD-10-CM | POA: Diagnosis present

## 2017-11-21 DIAGNOSIS — Z8042 Family history of malignant neoplasm of prostate: Secondary | ICD-10-CM

## 2017-11-21 DIAGNOSIS — N179 Acute kidney failure, unspecified: Secondary | ICD-10-CM

## 2017-11-21 DIAGNOSIS — I69318 Other symptoms and signs involving cognitive functions following cerebral infarction: Secondary | ICD-10-CM | POA: Diagnosis not present

## 2017-11-21 DIAGNOSIS — D72820 Lymphocytosis (symptomatic): Secondary | ICD-10-CM | POA: Diagnosis not present

## 2017-11-21 DIAGNOSIS — I824Y9 Acute embolism and thrombosis of unspecified deep veins of unspecified proximal lower extremity: Secondary | ICD-10-CM

## 2017-11-21 DIAGNOSIS — I69322 Dysarthria following cerebral infarction: Secondary | ICD-10-CM | POA: Diagnosis not present

## 2017-11-21 DIAGNOSIS — I482 Chronic atrial fibrillation: Secondary | ICD-10-CM

## 2017-11-21 LAB — CBC
HCT: 48 % (ref 39.0–52.0)
Hemoglobin: 16.1 g/dL (ref 13.0–17.0)
MCH: 29.7 pg (ref 26.0–34.0)
MCHC: 33.5 g/dL (ref 30.0–36.0)
MCV: 88.4 fL (ref 78.0–100.0)
Platelets: 183 10*3/uL (ref 150–400)
RBC: 5.43 MIL/uL (ref 4.22–5.81)
RDW: 12.8 % (ref 11.5–15.5)
WBC: 18.4 10*3/uL — AB (ref 4.0–10.5)

## 2017-11-21 LAB — URINALYSIS, ROUTINE W REFLEX MICROSCOPIC
Bilirubin Urine: NEGATIVE
Glucose, UA: NEGATIVE mg/dL
KETONES UR: NEGATIVE mg/dL
Leukocytes, UA: NEGATIVE
NITRITE: NEGATIVE
PROTEIN: 30 mg/dL — AB
Specific Gravity, Urine: 1.017 (ref 1.005–1.030)
pH: 5 (ref 5.0–8.0)

## 2017-11-21 LAB — BASIC METABOLIC PANEL
ANION GAP: 9 (ref 5–15)
BUN: 33 mg/dL — AB (ref 8–23)
CALCIUM: 9.1 mg/dL (ref 8.9–10.3)
CO2: 23 mmol/L (ref 22–32)
CREATININE: 0.9 mg/dL (ref 0.61–1.24)
Chloride: 104 mmol/L (ref 98–111)
GFR calc Af Amer: >60 mL/min (ref >60)
GFR calc non Af Amer: >60 mL/min (ref >60)
GLUCOSE: 123 mg/dL — AB (ref 70–99)
Potassium: 3.7 mmol/L (ref 3.5–5.1)
Sodium: 136 mmol/L (ref 135–145)

## 2017-11-21 MED ORDER — SENNOSIDES-DOCUSATE SODIUM 8.6-50 MG PO TABS: 1.0000 | ORAL_TABLET | Freq: Every evening | ORAL | Status: DC | PRN

## 2017-11-21 MED ORDER — ACETAMINOPHEN 325 MG PO TABS: 325.0000 mg | ORAL_TABLET | ORAL | Status: DC | PRN

## 2017-11-21 MED ORDER — FLEET ENEMA 7-19 GM/118ML RE ENEM
1.0000 | ENEMA | Freq: Once | RECTAL | Status: AC | PRN
  Administered 2017-11-23: 1 via RECTAL
  Filled 2017-11-21: qty 1

## 2017-11-21 MED ORDER — APIXABAN 5 MG PO TABS: 5.0000 mg | ORAL_TABLET | Freq: Two times a day (BID) | ORAL | Status: DC

## 2017-11-21 MED ORDER — TRAZODONE HCL 50 MG PO TABS: 25.0000 mg | ORAL_TABLET | Freq: Every evening | ORAL | Status: DC | PRN

## 2017-11-21 MED ORDER — RESOURCE THICKENUP CLEAR PO POWD: 1.0000 | ORAL | Status: DC | PRN

## 2017-11-21 MED ORDER — ALUM & MAG HYDROXIDE-SIMETH 200-200-20 MG/5ML PO SUSP: 30.0000 mL | ORAL | Status: DC | PRN

## 2017-11-21 MED ORDER — BISACODYL 10 MG RE SUPP
10.0000 mg | Freq: Every day | RECTAL | Status: DC | PRN
  Administered 2017-11-23: 10 mg via RECTAL
  Filled 2017-11-21: qty 1

## 2017-11-21 MED ORDER — PROCHLORPERAZINE MALEATE 5 MG PO TABS: 5.0000 mg | ORAL_TABLET | Freq: Four times a day (QID) | ORAL | Status: DC | PRN

## 2017-11-21 MED ORDER — DIPHENHYDRAMINE HCL 12.5 MG/5ML PO ELIX: 12.5000 mg | ORAL_SOLUTION | Freq: Four times a day (QID) | ORAL | Status: DC | PRN

## 2017-11-21 MED ORDER — CHLORHEXIDINE GLUCONATE 0.12 % MT SOLN
15.0000 mL | Freq: Two times a day (BID) | OROMUCOSAL | Status: DC
  Administered 2017-11-21 – 2017-11-24 (×6): 15 mL via OROMUCOSAL
  Filled 2017-11-21 (×6): qty 15

## 2017-11-21 MED ORDER — QUETIAPINE FUMARATE 25 MG PO TABS: 25.0000 mg | ORAL_TABLET | Freq: Every day | ORAL | Status: DC

## 2017-11-21 MED ORDER — POTASSIUM CHLORIDE IN NACL 20-0.9 MEQ/L-% IV SOLN
INTRAVENOUS | Status: DC
  Administered 2017-11-21: 18:00:00 via INTRAVENOUS
  Administered 2017-11-22: 75 mL/h via INTRAVENOUS
  Administered 2017-11-22 – 2017-11-23 (×2): via INTRAVENOUS
  Filled 2017-11-21 (×5): qty 1000

## 2017-11-21 MED ORDER — DIVALPROEX SODIUM 125 MG PO CSDR
250.0000 mg | DELAYED_RELEASE_CAPSULE | Freq: Three times a day (TID) | ORAL | Status: DC
  Administered 2017-11-21 – 2017-11-24 (×9): 250 mg via ORAL
  Filled 2017-11-21 (×11): qty 2

## 2017-11-21 MED ORDER — PRAVASTATIN SODIUM 20 MG PO TABS: 20.0000 mg | ORAL_TABLET | Freq: Every day | ORAL | Status: DC

## 2017-11-21 MED ORDER — PROCHLORPERAZINE EDISYLATE 10 MG/2ML IJ SOLN: 5.0000 mg | Freq: Four times a day (QID) | INTRAMUSCULAR | Status: DC | PRN

## 2017-11-21 MED ORDER — DIVALPROEX SODIUM 125 MG PO CSDR
250.0000 mg | DELAYED_RELEASE_CAPSULE | Freq: Three times a day (TID) | ORAL | Status: DC
  Administered 2017-11-21: 250 mg via ORAL
  Filled 2017-11-21: qty 2

## 2017-11-21 MED ORDER — APIXABAN 5 MG PO TABS
5.0000 mg | ORAL_TABLET | Freq: Two times a day (BID) | ORAL | Status: DC
  Administered 2017-11-21 – 2017-11-24 (×6): 5 mg via ORAL
  Filled 2017-11-21 (×6): qty 1

## 2017-11-21 MED ORDER — SODIUM CHLORIDE 0.9 % IV SOLN
INTRAVENOUS | Status: DC
  Filled 2017-11-21: qty 1000

## 2017-11-21 MED ORDER — POLYETHYLENE GLYCOL 3350 17 G PO PACK: 17.0000 g | PACK | Freq: Every day | ORAL | Status: DC | PRN

## 2017-11-21 MED ORDER — PRAVASTATIN SODIUM 20 MG PO TABS
20.0000 mg | ORAL_TABLET | Freq: Every day | ORAL | Status: DC
  Administered 2017-11-22 – 2017-11-23 (×2): 20 mg via ORAL
  Filled 2017-11-21 (×4): qty 1

## 2017-11-21 MED ORDER — NICOTINE 7 MG/24HR TD PT24
7.0000 mg | MEDICATED_PATCH | Freq: Every day | TRANSDERMAL | Status: DC
Start: 2017-11-22 — End: 2017-11-24
  Administered 2017-11-22 – 2017-11-24 (×2): 7 mg via TRANSDERMAL
  Filled 2017-11-21 (×3): qty 1

## 2017-11-21 MED ORDER — QUETIAPINE FUMARATE 25 MG PO TABS
25.0000 mg | ORAL_TABLET | Freq: Every day | ORAL | Status: DC
  Administered 2017-11-21 – 2017-11-23 (×3): 25 mg via ORAL
  Filled 2017-11-21 (×3): qty 1

## 2017-11-21 MED ORDER — GUAIFENESIN-DM 100-10 MG/5ML PO SYRP: 5.0000 mL | ORAL_SOLUTION | Freq: Four times a day (QID) | ORAL | Status: DC | PRN

## 2017-11-21 MED ORDER — CARVEDILOL 25 MG PO TABS
25.0000 mg | ORAL_TABLET | Freq: Two times a day (BID) | ORAL | Status: DC
  Administered 2017-11-21 – 2017-11-24 (×6): 25 mg via ORAL
  Filled 2017-11-21 (×6): qty 1

## 2017-11-21 MED ORDER — DIVALPROEX SODIUM 125 MG PO CSDR: 250.0000 mg | DELAYED_RELEASE_CAPSULE | Freq: Three times a day (TID) | ORAL | Status: DC

## 2017-11-21 MED ORDER — PROCHLORPERAZINE 25 MG RE SUPP: 12.5000 mg | Freq: Four times a day (QID) | RECTAL | Status: DC | PRN

## 2017-11-21 MED ORDER — ORAL CARE MOUTH RINSE
15.0000 mL | Freq: Two times a day (BID) | OROMUCOSAL | Status: DC
  Administered 2017-11-22 – 2017-11-24 (×6): 15 mL via OROMUCOSAL

## 2017-11-21 MED ORDER — NICOTINE 7 MG/24HR TD PT24: 7.0000 mg | MEDICATED_PATCH | Freq: Every day | TRANSDERMAL | 0 refills | Status: DC

## 2017-11-21 MED ORDER — QUETIAPINE FUMARATE 25 MG PO TABS: 12.5000 mg | ORAL_TABLET | Freq: Every day | ORAL | Status: DC

## 2017-11-21 MED ORDER — HYDROCHLOROTHIAZIDE 12.5 MG PO CAPS
12.5000 mg | ORAL_CAPSULE | Freq: Every day | ORAL | Status: DC
  Administered 2017-11-22 – 2017-11-24 (×3): 12.5 mg via ORAL
  Filled 2017-11-21 (×3): qty 1

## 2017-11-21 NOTE — PMR Pre-admission (Addendum)
PMR Admission Coordinator Pre-Admission Assessment  Patient: Dakota [name] is an 84 y.o., male MRN: [id number] DOB: Sep 01, 1928 Height: 5\' 7"  (170.2 cm) Weight: 85.5 kg (188 lb 7.9 oz)              Insurance Information HMO:     PPO:      PCP:      IPA:      80/20:      OTHER: no HMO PRIMARY: Medicare a and b      Policy#: [id number]      Subscriber: pt Eff. Date: 08/30/1992     Deduct: $1364      Out of Pocket Max: none      Life Max: none CIR: 100%      SNF: 20 full days Outpatient: 80%     Co-Pay: 20% Home Health: 100%      Co-Pay: none DME: 80%     Co-Pay: 20% Providers: pt choice  SECONDARY: [employer]      Policy#: [id number]      Subscriber: pt  Medicaid Application Date:       Case Manager:  Disability Application Date:       Case Worker:   Emergency Hecker    Name Relation Home Work Mobile   [place] Daughter [phone]     Juventino, Pavone [phone]  [phone]     Current Medical History  Patient Admitting Diagnosis: right CVA  History of Present Illness: HPI: Dakota [name] is a 84 y.o. male with history of HTN, CHB s/p PPM, GERD/Schatzki's ring, PAF- refused coumadin; who was admitted on 11/16/17 with left sided weakness, right gaze preference,  slurred speech and mental status changes.  CT head showed acute to subacute right basal ganglia infarct with concerns of distal R-MCA thrombus. CTA/P head done revealing R-MCA territory penumbra with moderate to severe  M3 branch irregularity. He underwent cerebral angio with revascularization of R-MCA M2 segment by Dr.Deveshwar.  Work up underway and Dr. Erlinda Hong felt that stroke embolic due to A fib.  Plans to start Eliquis 5 mg po twice daily on hospital day 5 if remains stable.Therapy evaluations completed today and patient with significant limitations due to delirium, severe dysarthria, right gaze preference with left sided weakness.  History of in hospital delirium. Initially  placed on Seroquel 25 mg bid but became very sedated, so dose decreased to 12.5 bid. On lower dose , patient agitated and unable to participate in therapy due to elevated BP. Seroquel then ordered 12.5 mg q am and 25 mg at bedtime. Added Depakote sprinkles 250 tid to assist with agitation.   Patient chews tobacco so nicotine patch applied and felt could contribute to some reasons for delirium.  Leukocytosis 18.4 afebrile not on antibiotics. CXR with no PNA , UA few bacteria but no WBC.  .Total: 19 NIHSS    Past Medical History  Past Medical History:  Diagnosis Date  . Anxiety   . Back pain, chronic   . Complete heart block ([place]) 07/18/2014   Medtronic Novice model [id number] (serial number [id number])  singe lead PPM  . Esophageal obstruction due to food impaction 2017  . GERD (gastroesophageal reflux disease) 10/2002  . Hyperlipidemia 12/1994  . Hypertension   . Hypokalemia   . Lung collapse 03/1991   Fall  . Permanent atrial fibrillation ([name])    Refused coumadin therapy    Family History  family history includes Cancer in his brother, brother, and father; Heart disease  in his sister and son; Hip fracture in his mother; Pneumonia in his mother; Prostate cancer in his brother.  Prior Rehab/Hospitalizations:  Has the patient had major surgery during 100 days prior to admission? No  Current Medications   Current Facility-Administered Medications:  .   stroke: mapping our early stages of recovery book, , Does not apply, Once, Rinehuls, David L, PA-C .  0.9 %  sodium chloride infusion, , Intravenous, Continuous, Rinehuls, Early Chars, PA-C, Last Rate: 50 mL/hr at 11/20/17 2138 .  acetaminophen (TYLENOL) tablet 650 mg, 650 mg, Oral, Q4H PRN, 650 mg at 11/17/17 2111 **OR** acetaminophen (TYLENOL) solution 650 mg, 650 mg, Per Tube, Q4H PRN **OR** acetaminophen (TYLENOL) suppository 650 mg, 650 mg, Rectal, Q4H PRN, Rinehuls, David L, PA-C .  aspirin EC tablet 325 mg, 325 mg, Oral, Daily, 325 mg  at 11/21/17 1124 **OR** aspirin suppository 300 mg, 300 mg, Rectal, Daily, Rinehuls, David L, PA-C, 300 mg at 11/17/17 0835 .  carvedilol (COREG) tablet 25 mg, 25 mg, Oral, BID WC, Kerney Elbe, MD, 25 mg at 11/21/17 1124 .  divalproex (DEPAKOTE SPRINKLE) capsule 250 mg, 250 mg, Oral, Q8H, Biby, Sharon L, NP .  enoxaparin (LOVENOX) injection 40 mg, 40 mg, Subcutaneous, Q24H, Rinehuls, David L, PA-C, 40 mg at 11/21/17 1123 .  hydrochlorothiazide (MICROZIDE) capsule 12.5 mg, 12.5 mg, Oral, Daily, Rinehuls, David L, PA-C, 12.5 mg at 11/21/17 1124 .  labetalol (NORMODYNE,TRANDATE) injection 20 mg, 20 mg, Intravenous, Q10 min PRN, Biby, Sharon L, NP, 20 mg at 11/20/17 1104 .  MEDLINE mouth rinse, 15 mL, Mouth Rinse, BID, Rinehuls, David L, PA-C, 15 mL at 11/21/17 1133 .  nicotine (NICODERM CQ - dosed in mg/24 hr) patch 7 mg, 7 mg, Transdermal, Daily, Rinehuls, David L, PA-C, 7 mg at 11/21/17 1124 .  pravastatin (PRAVACHOL) tablet 20 mg, 20 mg, Oral, q1800, Biby, Sharon L, NP .  QUEtiapine (SEROQUEL) tablet 12.5 mg, 12.5 mg, Oral, Daily, Biby, Sharon L, NP, 12.5 mg at 11/21/17 1124 .  QUEtiapine (SEROQUEL) tablet 25 mg, 25 mg, Oral, QHS, Biby, Sharon L, NP, 25 mg at 11/20/17 2129 .  RESOURCE THICKENUP CLEAR, , Oral, PRN, Rinehuls, David L, PA-C .  senna-docusate (Senokot-S) tablet 1 tablet, 1 tablet, Oral, QHS PRN, Rinehuls, Early Chars, PA-C  Patients Current Diet:  Diet Order           DIET - DYS 1 Room service appropriate? 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 No confusion  Home Assistive Devices / Equipment Home Assistive  Devices/Equipment: None Home Equipment: Walker - 2 wheels, Transport chair, Bedside commode  Prior Device Use: Indicate devices/aids used by the patient prior to current illness, exacerbation or injury? None of the above  Prior Functional Level Prior Function Level of Independence: Independent Comments: very independent, 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 Pt has 4 kids who can assist  Self Care: Did the patient need help bathing, dressing, using the toilet or eating?  Independent  Indoor Mobility: Did the patient need assistance with walking from room to room (with or without device)? Independent  Stairs: Did the patient need assistance  with internal or external stairs (with or without device)? Independent  Functional Cognition: Did the patient need help planning regular tasks such as shopping or remembering to take medications? Independent  Current Functional Level Cognition  Arousal/Alertness: Awake/alert Overall Cognitive Status: Impaired/Different from baseline Current Attention Level: Focused Orientation Level: Oriented to person, Disoriented to place, Disoriented to time, Disoriented to situation Following Commands: Follows one step commands inconsistently, Follows one step commands with increased time Safety/Judgement: Decreased awareness of safety, Decreased awareness of deficits General Comments: able to follow some commands with tactile and verbal cueing to ellicit appropriate responses Attention: Focused, Sustained Focused Attention: Appears intact Sustained Attention: Impaired Memory: Impaired Memory Impairment: Decreased short term memory Decreased Short Term Memory: Verbal basic Awareness: Impaired Awareness Impairment: Intellectual impairment Problem Solving: Impaired Problem Solving Impairment: Verbal basic, Functional basic Behaviors: Restless Safety/Judgment: Impaired    Extremity Assessment (includes Sensation/Coordination)  Upper Extremity  Assessment: Generalized weakness, LUE deficits/detail LUE Deficits / Details: Decreased attention to L side of body. Spontaneous active movement.   Lower Extremity Assessment: Defer to PT evaluation RLE Deficits / Details: grossly 3/5 strength but pt resisting knee flexion on command and extending, maintaining weight bearing in standing LLE Deficits / Details: grossly 3/5, able to bend knee at times on command in supine, requires blocking in standing with slight buckling, unable to fully assess due to command following    ADLs  Overall ADL's : Needs assistance/impaired Eating/Feeding: NPO Eating/Feeding Details (indicate cue type and reason): dentures removed. pt with only bottom plate and son uncertain where the top plate is located Upper Body Bathing: Maximal assistance Lower Body Bathing: Total assistance Upper Body Dressing : Maximal assistance Lower Body Dressing: Total assistance, Bed level Toilet Transfer: +2 for physical assistance, Maximal assistance Toilet Transfer Details (indicate cue type and reason): requires (A) to shift weight toward chair and bd Toileting- Clothing Manipulation and Hygiene: Total assistance, Sit to/from stand General ADL Comments: Simulated ADL participation this session. Pt able to cross midline gaze to hold for 10-15 seconds today.     Mobility  Overal bed mobility: Needs Assistance Bed Mobility: Supine to Sit Rolling: Total assist, +2 for physical assistance Sidelying to sit: Total assist, +2 for physical assistance Supine to sit: Total assist, +2 for physical assistance General bed mobility comments: Total A +2; requires physical assist from PT to bring LE towards EOB (L); attempts to go to R side of bed with max cueing to come towards L; use of bed pad for trunk/hip righting    Transfers  Overall transfer level: Needs assistance Equipment used: 2 person hand held assist Transfers: Sit to/from Stand, Stand Pivot Transfers Sit to Stand: Max assist,  +2 physical assistance Stand pivot transfers: Max assist, +2 physical assistance General transfer comment: Sit to stand x 3 for ~2 min each trial for peri-care; on 3rd attempt does seem to bear more weight through B LE but does require B knee blocking during static stand due to reduced hip/knee flexion needed for stable upright balance    Ambulation / Gait / Stairs / Wheelchair Mobility  Ambulation/Gait General Gait Details: unable    Posture / Balance Dynamic Sitting Balance Sitting balance - Comments: assist ranging from max to min guard; prefers L UE to be behind back with with max tactile cueing to use UE for support; R UE reaching for end of bed for balance and scoots hips towards EOB/end of bed Balance Overall balance assessment: Needs assistance Sitting-balance support: Single extremity supported, Feet supported  Sitting balance-Leahy Scale: Poor Sitting balance - Comments: assist ranging from max to min guard; prefers L UE to be behind back with with max tactile cueing to use UE for support; R UE reaching for end of bed for balance and scoots hips towards EOB/end of bed Postural control: Posterior lean, Left lateral lean Standing balance support: Bilateral upper extremity supported Standing balance-Leahy Scale: Zero Standing balance comment: B UE support in standing as well as knee blocking with max cueing for improved forward gaze and hip/knee extension    Special needs/care consideration BiPAP/CPAP  N/a CPM  N/a Continuous Drip IV IVF at 50 cc/hr Dialysis  N/a Life Vest  N/a Oxygen  N/a Special Bed n/a Trach Size  N/a Wound Vac  N/a Skin ecchymosis to bilateral UE Bowel mgmt: incontinent LBM 7/23 Bladder mgmt: external catheter Diabetic mgmt yes patient with delirium with multiple med changes. Pta, no confusion, independent. Family needs a lot of education on handling environmental factors to decrease stimuli     Previous Home Environment Living Arrangements:  Spouse/significant other  Lives With: Spouse Available Help at Discharge: Family, Available 24 hours/day Type of Home: House Home Layout: One level Home Access: Ramped entrance Bathroom Shower/Tub: Walk-in shower, Chiropodist: Standard Bathroom Accessibility: Yes How Accessible: Accessible via walker [place]: No Additional Comments: pt was primary caregiver for wife who does not walk, pt would transfer her and family assists with bathing  Discharge Living Setting Plans for Discharge Living Setting: Patient's home, Lives with (comment)(wife) Type of Home at Discharge: House Discharge Home Layout: One level Discharge Home Access: [place] entrance Discharge Bathroom Shower/Tub: Walk-in shower Discharge Bathroom Toilet: Standard Discharge Bathroom Accessibility: Yes How Accessible: Accessible via walker Does the patient have any problems obtaining your medications?: No  Social/Family/Support Systems Patient Roles: Spouse, Caregiver(pt was primary caregiver for his wife) Sport and exercise psychologist Information: Abe People and Herbie Baltimore per Herbie Baltimore Anticipated Caregiver: family Anticipated Ambulance person Information: see above Ability/Limitations of Caregiver: [place] retired Building control surveyor Availability: 24/7 Discharge Plan Discussed with Primary Caregiver: Yes Is Caregiver In Agreement with Plan?: Yes Does Caregiver/Family have Issues with Lodging/Transportation while Pt is in Rehab?: No   Goals/Additional Needs Patient/Family Goal for Rehab: supervision to min assist with PT, OT, and SLP Expected length of stay: ELOS 19 to 24 days Special Service Needs: patietn typically is alert and ortiented. Any he is hospitalized he becomes very confused per Herbie Baltimore Pt/Family Agrees to Admission and willing to participate: Yes Program Orientation Provided & Reviewed with Pt/Caregiver Including Roles  & Responsibilities: Yes  Barriers to Discharge: Behavior(always becomes confused when  hospitalized)  Decrease burden of Care through IP rehab admission:  N/a  Possible need for SNF placement upon discharge:not anticipated; son, Drago states pt would go home with family support no matter what for he only gets confused when hospitalized.  Patient Condition: This patient's medical and functional status has changed since the consult dated: 11/17/2017 in which the Rehabilitation Physician determined and documented that the patient's condition is appropriate for intensive rehabilitative care in an inpatient rehabilitation facility. See "History of Present Illness" (above) for medical update. Functional changes are: overall max to total assist. Patient's medical and functional status update has been discussed with the Rehabilitation physician and patient remains appropriate for inpatient rehabilitation. Will admit to inpatient rehab today.  Preadmission Screen Completed By:  Cleatrice Burke, 11/21/2017 1:52 PM ______________________________________________________________________   Discussed status with Dr. Posey Pronto on 11/21/2017 at  1405 and received telephone approval for admission today.  Admission Coordinator:  Cleatrice Burke, time 4784 Date 11/21/2017

## 2017-11-21 NOTE — Progress Notes (Signed)
Physical Therapy Treatment Patient Details Name: Dakota [name] MRN: [id number] DOB: 10-05-1928 Today's Date: 11/21/2017    History of Present Illness 84 yo admitted with left weakness and right gaze preference with right basal ganglia infarct s/p revascularization. 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, atrial fibrillation    PT Comments    Dakota [name] doing well this morning; sons present for session. Does continue to require total A +2 for bed mobility as patient with difficulty coming to L side with reduced initiation of movement. Max A +2 for sit to stand and stand pivot to recliner. Able to progress sit to stand today - 3 trials of ~2 min with patient with progressive weight bearing and reduced physical assist to maintain upright with each trial, however still Max A +2. PT to continue to follow acutely.    Follow Up Recommendations  CIR;Supervision/Assistance - 24 hour     Equipment Recommendations  (TBD)    Recommendations for Other Services       Precautions / Restrictions Precautions Precautions: Fall Precaution Comments: right gaze, left hemiparesis Restrictions Weight Bearing Restrictions: No    Mobility  Bed Mobility Overal bed mobility: Needs Assistance Bed Mobility: Supine to Sit     Supine to sit: Total assist;+2 for physical assistance     General bed mobility comments: Total A +2; requires physical assist from PT to bring LE towards EOB (L); attempts to go to R side of bed with max cueing to come towards L; use of bed pad for trunk/hip righting  Transfers Overall transfer level: Needs assistance Equipment used: 2 person hand held assist Transfers: Sit to/from Stand;Stand Pivot Transfers Sit to Stand: Max assist;+2 physical assistance Stand pivot transfers: Max assist;+2 physical assistance       General transfer comment: Sit to stand x 3 for ~2 min each trial for peri-care; on 3rd attempt does seem to bear more weight through  B LE but does require B knee blocking during static stand due to reduced hip/knee flexion needed for stable upright balance  Ambulation/Gait                 Stairs             Wheelchair Mobility    Modified Rankin (Stroke Patients Only) Modified Rankin (Stroke Patients Only) Pre-Morbid Rankin Score: No symptoms Modified Rankin: Severe disability     Balance Overall balance assessment: Needs assistance Sitting-balance support: Single extremity supported;Feet supported Sitting balance-Leahy Scale: Poor Sitting balance - Comments: assist ranging from max to min guard; prefers L UE to be behind back with with max tactile cueing to use UE for support; R UE reaching for end of bed for balance and scoots hips towards EOB/end of bed Postural control: Posterior lean;Left lateral lean Standing balance support: Bilateral upper extremity supported Standing balance-Leahy Scale: Zero Standing balance comment: B UE support in standing as well as knee blocking with max cueing for improved forward gaze and hip/knee extension                            Cognition Arousal/Alertness: Awake/alert Behavior During Therapy: Restless;Flat affect Overall Cognitive Status: Impaired/Different from baseline Area of Impairment: Orientation;Attention;Memory;Following commands;Safety/judgement;Problem solving                 Orientation Level: Disoriented to;Place;Time;Situation Current Attention Level: Focused Memory: Decreased short-term memory Following Commands: Follows one step commands inconsistently;Follows one step commands with increased time  Safety/Judgement: Decreased awareness of safety;Decreased awareness of deficits   Problem Solving: Slow processing;Decreased initiation;Difficulty sequencing;Requires verbal cues;Requires tactile cues        Exercises      General Comments General comments (skin integrity, edema, etc.): BP stable throughout - 149/85; SpO2  desat to 88% briefly, but improves with rest; alerted nursing of skin breakdown to L back/shoulder and R lower extremity      Pertinent Vitals/Pain Pain Assessment: No/denies pain    Home Living                      Prior Function            PT Goals (current goals can now be found in the care plan section) Acute Rehab PT Goals Patient Stated Goal: per son, to be independent PT Goal Formulation: With family Time For Goal Achievement: 12/01/17 Potential to Achieve Goals: Good Progress towards PT goals: Progressing toward goals    Frequency    Min 4X/week      PT Plan Current plan remains appropriate    Co-evaluation              AM-PAC PT "6 Clicks" Daily Activity  Outcome Measure  Difficulty turning over in bed (including adjusting bedclothes, sheets and blankets)?: Unable Difficulty moving from lying on back to sitting on the side of the bed? : Unable Difficulty sitting down on and standing up from a chair with arms (e.g., wheelchair, bedside commode, etc,.)?: Unable Help needed moving to and from a bed to chair (including a wheelchair)?: A Lot Help needed walking in hospital room?: Total Help needed climbing 3-5 steps with a railing? : Total 6 Click Score: 7    End of Session Equipment Utilized During Treatment: Gait belt Activity Tolerance: Patient tolerated treatment well Patient left: in chair;with call bell/phone within reach;with chair alarm set;with family/visitor present Nurse Communication: Mobility status PT Visit Diagnosis: Other abnormalities of gait and mobility (R26.89);Other symptoms and signs involving the nervous system (R29.898);Hemiplegia and hemiparesis Hemiplegia - Right/Left: Left Hemiplegia - dominant/non-dominant: Non-dominant Hemiplegia - caused by: Cerebral infarction     Time: 0810-0836 PT Time Calculation (min) (ACUTE ONLY): 26 min  Charges:  $Therapeutic Activity: 23-37 mins                    G Codes:        Lanney Gins, PT, DPT 11/21/17 9:48 AM Pager: [phone]

## 2017-11-21 NOTE — Discharge Summary (Addendum)
Stroke Discharge Summary  Patient ID: Dakota [name]   MRN: [id number]      DOB: December 27, 1928  Date of Admission: 11/16/2017 Date of Discharge: 11/21/2017  Attending Physician:  Garvin Fila, MD, Stroke MD Consultant(s):   Wyman Songster, MD (Interventional Neuroradiologist), Alger Simons, MD (Physical Medicine & Rehabtilitation) Patient's PCP:  Tonia Ghent, MD  Discharge Diagnoses:  Principal Problem:   Middle cerebral artery embolism, right s/p endovascular therapy Active Problems:   HLD (hyperlipidemia)   Essential hypertension, benign   Atrial fibrillation ([name])   CHB (complete heart block) - s/p MDT 1 lead PPM   ARF (acute renal failure) ([place])   Caregiver stress   Acute delirium   Dysphagia due to recent cerebrovascular accident   Chewing tobacco nicotine dependence   Leukocytosis  Past Medical History:  Diagnosis Date  . Anxiety   . Back pain, chronic   . Complete heart block ([place]) 07/18/2014   Medtronic [place] model [id number] (serial number [id number])  singe lead PPM  . Esophageal obstruction due to food impaction 2017  . GERD (gastroesophageal reflux disease) 10/2002  . Hyperlipidemia 12/1994  . Hypertension   . Hypokalemia   . Lung collapse 03/1991   Fall  . Permanent atrial fibrillation ([name])    Refused coumadin therapy   Past Surgical History:  Procedure Laterality Date  . BACK SURGERY  11/91   with hardware fixation  . CARDIAC CATHETERIZATION  06/2004   30 % stenosis, EF normal  . ESOPHAGOGASTRODUODENOSCOPY N/A 01/06/2016   Procedure: ESOPHAGOGASTRODUODENOSCOPY (EGD);  Surgeon: Otis Brace, MD;  Location: [place];  Service: Gastroenterology;  Laterality: N/A;  . EYE SURGERY     Retinal bubble surgery, cataract   . IR CT HEAD LTD  11/16/2017  . IR PERCUTANEOUS ART THROMBECTOMY/INFUSION INTRACRANIAL INC DIAG ANGIO  11/16/2017  . LEFT HEART CATHETERIZATION WITH CORONARY ANGIOGRAM N/A 07/18/2014   Procedure: LEFT HEART CATHETERIZATION  WITH CORONARY ANGIOGRAM;  Surgeon: Jettie Booze, MD; [facility] [place], LAD mild dz, D1 80%, D2 OK, CFX system OK, RCA OK, PDA 100%, med rx  . PERMANENT PACEMAKER INSERTION N/A 07/18/2014   Procedure: PERMANENT PACEMAKER INSERTION;  Surgeon: Thompson Grayer, MD; Medtronic [name] model [id number] (serial number [id number])    . RADIOLOGY WITH ANESTHESIA N/A 11/16/2017   Procedure: RADIOLOGY WITH ANESTHESIA;  Surgeon: Luanne Bras, MD;  Location: [place];  Service: Radiology;  Laterality: N/A;  . TEMPORARY PACEMAKER INSERTION N/A 07/18/2014   Procedure: TEMPORARY PACEMAKER INSERTION;  Surgeon: Peter M Martinique, MD;  Location: [facility] CATH LAB;  Service: Cardiovascular;  Laterality: N/A;    Medications to be continued on Rehab Allergies as of 11/21/2017      Reactions   Warfarin And Related Other (See Comments)   Lost a lot of weight and also experienced numbness   Atorvastatin Other (See Comments)   REACTION: MUSCLE PAIN   Ezetimibe Other (See Comments)   REACTION: MUSCLE PAIN   Diazepam Other (See Comments)   UNKNOWN, per pt   Penicillins Other (See Comments)   UNKNOWN, per pt      Medication List    STOP taking these medications   aspirin 81 MG tablet     TAKE these medications   ALPRAZolam 0.5 MG tablet Commonly known as:  XANAX TAKE ONE-HALF TABLET BY MOUTH TWICE DAILY AS NEEDED FOR ANXIETY What changed:    how much to take  how to take this  when to take this  reasons to  take this  additional instructions   apixaban 5 MG Tabs tablet Commonly known as:  ELIQUIS Take 1 tablet (5 mg total) by mouth 2 (two) times daily.   calcium carbonate 500 MG chewable tablet Commonly known as:  TUMS - dosed in mg elemental calcium Chew 1 tablet by mouth daily as needed for indigestion.   carvedilol 25 MG tablet Commonly known as:  COREG TAKE 1 TABLET BY MOUTH TWICE DAILY WITH  A  MEAL.   divalproex 125 MG capsule Commonly known as:  DEPAKOTE SPRINKLE Take 2 capsules (250 mg total) by  mouth every 8 (eight) hours.   docusate sodium 100 MG capsule Commonly known as:  COLACE Take 1 capsule (100 mg total) by mouth daily as needed for mild constipation.   hydrochlorothiazide 12.5 MG capsule Commonly known as:  MICROZIDE TAKE ONE CAPSULE BY MOUTH ONCE DAILY   MILK OF MAGNESIA PO Take 1 tablet by mouth daily as needed.   nicotine 7 mg/24hr patch Commonly known as:  NICODERM CQ - dosed in mg/24 hr Place 1 patch (7 mg total) onto the skin daily. Start taking on:  11/22/2017   nitroGLYCERIN 0.4 MG SL tablet Commonly known as:  NITROSTAT Place 1 tablet (0.4 mg total) under the tongue every 5 (five) minutes as needed for chest pain.   pantoprazole 40 MG tablet Commonly known as:  PROTONIX Take 1 tablet (40 mg total) by mouth daily.   pravastatin 20 MG tablet Commonly known as:  PRAVACHOL Take 1 tablet (20 mg total) by mouth daily at 6 PM.   QUEtiapine 25 MG tablet Commonly known as:  SEROQUEL Take 1 tablet (25 mg total) by mouth at bedtime.   QUEtiapine 25 MG tablet Commonly known as:  SEROQUEL Take 0.5 tablets (12.5 mg total) by mouth daily. Start taking on:  11/22/2017   ranitidine 150 MG tablet Commonly known as:  ZANTAC TAKE 1 TABLET BY MOUTH ONCE DAILY   RESOURCE THICKENUP CLEAR Powd Take 120 g by mouth as needed (nectar thick liquid consistency).       LABORATORY STUDIES CBC    Component Value Date/Time   WBC 18.4 (H) 11/21/2017 0347   RBC 5.43 11/21/2017 0347   HGB 16.1 11/21/2017 0347   HCT 48.0 11/21/2017 0347   PLT 183 11/21/2017 0347   MCV 88.4 11/21/2017 0347   MCH 29.7 11/21/2017 0347   MCHC 33.5 11/21/2017 0347   RDW 12.8 11/21/2017 0347   LYMPHSABS 1.5 11/17/2017 0500   MONOABS 0.7 11/17/2017 0500   EOSABS 0.0 11/17/2017 0500   BASOSABS 0.0 11/17/2017 0500   CMP    Component Value Date/Time   NA 136 11/21/2017 0347   K 3.7 11/21/2017 0347   CL 104 11/21/2017 0347   CO2 23 11/21/2017 0347   GLUCOSE 123 (H) 11/21/2017 0347    BUN 33 (H) 11/21/2017 0347   CREATININE 0.90 11/21/2017 0347   CALCIUM 9.1 11/21/2017 0347   PROT 7.6 11/16/2017 1134   ALBUMIN 4.0 11/16/2017 1134   AST 27 11/16/2017 1134   ALT 21 11/16/2017 1134   ALKPHOS 59 11/16/2017 1134   BILITOT 1.8 (H) 11/16/2017 1134   GFRNONAA >60 11/21/2017 0347   GFRAA >60 11/21/2017 0347   COAGS Lab Results  Component Value Date   INR 1.32 07/17/2014   Lipid Panel    Component Value Date/Time   CHOL 151 11/17/2017 0500   TRIG 61 11/17/2017 0500   HDL 37 (L) 11/17/2017 0500   CHOLHDL 4.1  11/17/2017 0500   VLDL 12 11/17/2017 0500   LDLCALC 102 (H) 11/17/2017 0500   HgbA1C  Lab Results  Component Value Date   HGBA1C 6.1 (H) 11/17/2017   Urinalysis    Component Value Date/Time   COLORURINE YELLOW 11/16/2017 1134   APPEARANCEUR CLEAR 11/16/2017 1134   LABSPEC 1.016 11/16/2017 1134   PHURINE 7.0 11/16/2017 1134   GLUCOSEU 50 (A) 11/16/2017 1134   HGBUR MODERATE (A) 11/16/2017 1134   BILIRUBINUR NEGATIVE 11/16/2017 1134   KETONESUR NEGATIVE 11/16/2017 1134   PROTEINUR 100 (A) 11/16/2017 1134   UROBILINOGEN 1.0 12/29/2014 0816   NITRITE NEGATIVE 11/16/2017 1134   LEUKOCYTESUR NEGATIVE 11/16/2017 1134   Urine Drug Screen No results found for: LABOPIA, COCAINSCRNUR, LABBENZ, AMPHETMU, THCU, LABBARB  Alcohol Level No results found for: [facility]   SIGNIFICANT DIAGNOSTIC STUDIES Ct Head Wo Contrast 11/16/2017 Acute to subacute infarct in the right basal ganglia, external capsule, and posterior operculum. Right distal MCA branch hyperdensity near the operculum likely reflects thrombus.   Ct Angio Head W Or Wo Contrast Ct Angio Neck W Or Wo Contrast Ct Cerebral Perfusion W Contrast 11/16/2017 1. Negative for large vessel occlusion. No discrete right MCA branch occlusion is identified, but moderate to severe right M3 branch irregularity is noted.  2. CT perfusion suggests posterior right MCA territory penumbra, but does not detect core  infarct with the standard parameters. However, a small 5 mL infarct is suggested with more sensitive CBF (<38%) parameter, located along the posterior operculum. Therefore, it is possible that the right lentiform finding is chronic.  3. Mild for age atherosclerosis in the head and neck with no hemodynamically significant arterial stenosis identified outside of the right M3 findings in #1.   Cerebral angiogram and Intervention - Dr Estanislado Pandy 11/16/2017 S/P RT common carotid arteriogram followed by endovascular revascularization of occluded RT MCA inf division distal M2 seg With x 1 pass with With 68mm x 20 mm trevoprovue retriever device achieving a TICI 2b plus revascularization.  CT Head WO Contrast 11/17/2017 1. Propagation of acute RIGHT MCA territory infarct now involving RIGHT insula and RIGHT temporoparietal lobes. Similar RIGHT basal ganglia acute infarct. No hemorrhagic conversion. 2. Dense distal RIGHT insular MCA consistent with thromboembolism.  Transthoracic Echocardiogram  11/17/17 Study Conclusions - Left ventricle: The cavity size was normal. There was moderateconcentric hypertrophy. Systolic function was vigorous. Theestimated ejection fraction was in the range of 65% to 70%. Wallmotion was normal; there were no regional wall motionabnormalities. - Aortic valve: Trileaflet; mildly thickened, mildly calcifiedleaflets. There was mild regurgitation. - Aortic root: The aortic root was normal in size. - Mitral valve: Calcified annulus. Mildly thickened leaflets.There was mild regurgitation. - Left atrium: The atrium was moderately dilated. - Right ventricle: Systolic function was normal. - Right atrium: The atrium was moderately dilated. - Tricuspid valve: There was mild regurgitation. - Pulmonary arteries: Systolic pressure was within the normalrange. - Inferior vena cava: The vessel was normal in size. - Pericardium, extracardiac: There was no pericardial  effusion. Impressions:   No cardiac source of emboli was indentified.  DG Chest Portable 1 View 11/18/2017 Low lung volumes with cardiomegaly and mild increased vascular congestion.    HISTORY OF PRESENT ILLNESS Dakota [name] is an 84 y.o. 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  To the best of our history taking, patient's last seen normal was 2130 hrs on 11/15/17. He was brought to ER with LKW being documented at 1100 hrs  11/15/17 but he confirmed he went to bed normal around 2130 hrs. When found he had a right gaze preference and was weak on the left side.  Patient at this time is able to talk and answer questions along with follow commands.  He does have a drift in bilateral legs along with dysarthria and left visual field cut.  Patient was brought to CT scanner where showed a possible right M2 occlusion and was brought back for CTA and perfusion.  CTA does show possible M2/M3 occlusion. Unfortunately he is the primary caregiver of his wife who is bedridden and cannot speak or give information to [place].  Patient does chew tobacco. He has Afib and decided not to opt for anticoagulation per his cardiologist note review. NIH stroke scale of 13. Modified Rankin: Rankin Score=1. tPA was not given as he was out of the window.  He was sent to endovascular for thrombectomy.    HOSPITAL COURSE Dakota [name] is a 84 y.o. male with history of permanent atrial fibrillation (not anticoagulated ), status post Medtronic permanent pacemaker for complete heart block, hypertension, hyperlipidemia, chronic back pain, and anxiety presenting with left-sided weakness and right gaze preference. He did not receive IV t-PA due to late presentation. S/P thrombectomy Dr Estanislado Pandy.  Stroke: right MCA infarct, embolic secondary to atrial fibrillation not on [facility] s/p TICI2b revascularization of R MCA M2  Resultant  Left neglect, right gaze and  left hemiparesis  CT head - Acute to subacute infarct in the right basal ganglia, external capsule, and posterior operculum. Right distal MCA branch hyperdensity near the operculum likely reflects thrombus.   CTA H&N - likely right M3 occlusion.   CT Perfusion - suggests posterior right MCA territory penumbra - (?? At least partial pseudo-normalization, exact size of penumbra is not clear)  DSA - right M2 occlusion s/p IR with TICI 2b+ reperfusion  MRI head - not able to do due to PPM  Repeat Head CT - Propagation of acute RT MCA territory infarct now involving RT insula and RT temporoparietal lobes. Similar RT basal ganglia acute infarct. Dense distal RT insular MCA c/w thromboembolism.  2D Echo - EF 65 to 70%.  No cardiac source of emboli identified.  LDL - 102  HgbA1c - 6.1  aspirin 81 mg daily prior to admission, now on aspirin suppository 300 mg or aspiring 325 po daily.  Start Eliquis 5 mg p.o. twice daily at discharge.   Ongoing aggressive stroke risk factor management  Therapy recommendations:  CIR  Disposition:  CIR  Afib not on AC  Hx of afib following with cardiology  Refused coumadin in the past due to ? Coumadin made leg weak  On ASA for now  Plan to start Eliquis 5 mg p.o. twice daily at discharge.   In hospital delirium  Hx of in hospital delirium  Initially placed on seroquel 25mg  bid but became very sedated, so dose decreased to 12.5 bid  On lower dose, patient agitated and unable to participate in therapy due to elevated blood pressure.  Seroquel 12.5 mg every morning and 25 mg at bedtime  Add Depakote sprinkles 250 tid to assist with agitation  Hypertension  Initially treated with Cardene, now off   BP goal < 180   Home meds: HCTZ and coreg 25 bid, resumed   Blood pressure improving  Long-term BP goal normotensive  Hyperlipidemia  Lipid lowering medication PTA:  none  LDL 102, goal < 70  Muscle pain w/ atorvastatin and  ezetimibe  Current lipid lowering medication: trial pravachol  Continue statin at discharge  May need to consider PSCK-9 as OP  Dysphagia   Severe dysarthria  D1 nectar thick liquids  Chewing tobacco  nicotine patch  Could be part of the reason of delirium  Other Stroke Risk Factors  Advanced age  Former cigarette smoker - quit  Other Active Problems  Complete heart block s/p Permanent pacemaker  Renal insufficiency BUN 26 ; creatinine 0.90 -> improved  Leukocytosis -> 18.4 (afebrile) not on abxs. CXR 7/20 -  no pneumonia. UA 7/18 few bacteria but no WBC ->follow on CIR   DISCHARGE EXAM per Dr. Leonie Man Blood pressure (!) 169/85, pulse (!) 107, temperature 98.9 F (37.2 C), temperature source Oral, resp. rate 19, height 5\' 7"  (1.702 m), weight 85.5 kg (188 lb 7.9 oz), SpO2 93 %. General - Frail elderly Caucasian male in no apparent distress.  Ophthalmologic - fundi not visualized due to noncooperation.  Cardiovascular - irregularly irregular heart rate and rhythm.  Neuro - drowsy but easily arousable, oriented to place and people, but not to time. Severe dysarthria but able to name and repeat. Following commands. PERRL, right gaze preference, not crossing midline. Left neglect, not blinking to visual threat on the left. Left facial droop. Tongue midline. Left UE 1/5 and LE 2/5 on pain stimulation. RUE and RLE 4/5. DTR 1+ and left babinski positive. Sensation symmetrical, coordination not cooperative and gait not tested.  Discharge Diet  D1 nectar thick liquids  DISCHARGE PLAN  Disposition:  Transfer to [place] for ongoing PT, OT and ST  Eliquis (apixaban) daily for secondary stroke prevention.  Recommend ongoing risk factor control by Primary Care Physician at time of discharge from inpatient rehabilitation.  Follow-up Tonia Ghent, MD in 4 weeks following discharge from rehab.  Follow-up Sanjeev [facility]) Estanislado Pandy, MD  (Interventional Neuroradiologist) in 4 weeks following discharge from rehab.  Follow-up in [facility] in 6 weeks following discharge from rehab, office to schedule an appointment.   35 minutes were spent preparing discharge.  Burnetta Sabin, MSN, APRN, ANVP-BC, AGPCNP-BC Advanced Practice Stroke Nurse [place] for Schedule & Pager information 11/21/2017 2:20 PM  I have personally examined this patient, reviewed notes, independently viewed imaging studies, participated in medical decision making and plan of care.ROS completed by me personally and pertinent positives fully documented  I have made any additions or clarifications directly to the above note. Agree with note above.    [name], MD Medical Director [facility] Pager: [phone] 11/21/2017 4:18 PM

## 2017-11-21 NOTE — Care Management Note (Signed)
Case Management Note  Patient Details  Name: KHA HARI MRN: [id number] Date of Birth: August 22, 1928  Subjective/Objective:                    Action/Plan: Pt discharging to CIR today. CM signing off.   Expected Discharge Date:                  Expected Discharge Plan:  [place]  In-House Referral:     Discharge planning Services  CM Consult  Post Acute Care Choice:    Choice offered to:     DME Arranged:    DME Agency:     HH Arranged:    [place] Agency:     Status of Service:  Completed, signed off  If discussed at [employer] of Stay Meetings, dates discussed:    Additional Comments:  Pollie Friar, RN 11/21/2017, 2:01 PM

## 2017-11-21 NOTE — Consult Note (Signed)
[facility] CM Primary Care Navigator  11/21/2017  Dakota [name] 1929/04/28 [id number]   Met with patient (very hoarse voice), son Dakota [name]) and daughter Dakota [name]) at the bedside toidentify possible discharge needs. Was able to talk to son Dakota [name]) over the phone as well.  Per son Dakota [name]), patient had an unwitnessed fall at home- was on the floor when found. Per MD note, patient presented with left-sided weakness, right gaze preference, slurred speech and mental status changes which resulted to this admission.  Patient's son endorsesDr. [name] with [place] at [facility] as theprimary care provider.   [place] on [address] and [place] on [address] toobtain medications without difficulty.  Patient's daughter has beenmanaginghismedications at home, with use of "pill box" system filled once a week.  Patient's children have beenprovidingtransportation tohisdoctors' appointments.  Patientliveswith wife and is the primary caregiver for wife. His children take turns and alternately provide assistance for him and wife at home. Son mentioned that a "lady caregiver" usually stays with them at home and takes care of their needs during day time.   According topatient's son, [facility].  Patient's sonvoiced understandingto callprimary careprovider'soffice whenpatientreturns backhome,for a post discharge follow-upvisitwithin1- 2 weeksor sooner if needs arise.Patient letter (with PCP's contact number) was providedastheirreminder.   Explained topatient's childrenregarding [facility] CM services available for health management/ resourcesat homebut denied of any needs or concerns at this point.Patient's son verbalizedunderstandingof needto seekreferral from primary care provider to [facility] care management ifdeemed necessary  and appropriatefor anyservicesin the future-once patient isdischargeback home.   [facility] care management information was provided for futureneeds thatpatientmay have.  Primary care provider's office is listed as providing transition of care (TOC) follow-up.   For additional questions please contact:  Edwena Felty A. Veronica Guerrant, BSN, RN-BC [facility] PRIMARY CARE Navigator Cell: [phone]

## 2017-11-21 NOTE — Progress Notes (Signed)
Dakota Arn, MD  Physician  Physical Medicine and Rehabilitation  PMR Pre-admission  Addendum  Date of Service:  11/21/2017 1:52 PM       Related encounter: ED to Hosp-Admission (Current) from 11/16/2017 in [facility]           Show:Clear all [x] Manual[x] Template[x] Copied  Added by: [x] Cristina Gong, RN   [] Hover for details   PMR Admission Coordinator Pre-Admission Assessment  Patient: Dakota [name] is an 84 y.o., male MRN: [id number] DOB: March 27, 1929 Height: 5\' 7"  (170.2 cm) Weight: 85.5 kg (188 lb 7.9 oz)                                                                                                                                                  Insurance Information HMO:     PPO:      PCP:      IPA:      80/20:      OTHER: no HMO PRIMARY: Medicare a and b      Policy#: [id number]      Subscriber: pt Eff. Date: 08/30/1992     Deduct: $1364      Out of Pocket Max: none      Life Max: none CIR: 100%      SNF: 20 full days Outpatient: 80%     Co-Pay: 20% Home Health: 100%      Co-Pay: none DME: 80%     Co-Pay: 20% Providers: pt choice  SECONDARY: [employer]      Policy#: [id number]      Subscriber: pt  Medicaid Application Date:       Case Manager:  Disability Application Date:       Case Worker:   Emergency [facility]    Name Relation Home Work Dakota [name] Daughter [phone]     Dakota [name], Dakota [name] [phone]  [phone]     Current Medical History  Patient Admitting Diagnosis: right CVA  History of Present Illness: Dakota [name] a 84 y.o.malewith history of HTN, CHB s/p PPM, GERD/Schatzki's ring, PAF- refused coumadin; who was admitted on 11/16/17 with left sided weakness, right gaze preference, slurred speech and mental status changes. CT head showed acute to subacute right basal ganglia infarct with concerns of distal R-MCA thrombus. CTA/P  head done revealing R-MCA territory penumbra with moderate to severe M3 branch irregularity. He underwent cerebral angio with revascularization of R-MCA M2 segment by Dr.Deveshwar. Work up underway and Dr. Erlinda Hong felt that stroke embolic due to A fib.  Plans to start Eliquis 5 mg po twice daily on hospital day 5 if remains stable.Therapy evaluations completed today and patient with significant limitations due to delirium, severe dysarthria, right gaze preference with left sided weakness.  History of in hospital delirium.  Initially placed on Seroquel 25 mg bid but became very sedated, so dose decreased to 12.5 bid. On lower dose , patient agitated and unable to participate in therapy due to elevated BP. Seroquel then ordered 12.5 mg q am and 25 mg at bedtime. Added Depakote sprinkles 250 tid to assist with agitation.   Patient chews tobacco so nicotine patch applied and felt could contribute to some reasons for delirium.  Leukocytosis 18.4 afebrile not on antibiotics. CXR with no PNA , UA few bacteria but no WBC.  .Total: 19 NIHSS  Past Medical History      Past Medical History:  Diagnosis Date  . Anxiety   . Back pain, chronic   . Complete heart block ([place]) 07/18/2014   Medtronic Dandridge model [id number] (serial number [id number])  singe lead PPM  . Esophageal obstruction due to food impaction 2017  . GERD (gastroesophageal reflux disease) 10/2002  . Hyperlipidemia 12/1994  . Hypertension   . Hypokalemia   . Lung collapse 03/1991   Fall  . Permanent atrial fibrillation ([name])    Refused coumadin therapy    Family History  family history includes Cancer in his brother, brother, and father; Heart disease in his sister and son; Hip fracture in his mother; Pneumonia in his mother; Prostate cancer in his brother.  Prior Rehab/Hospitalizations:  Has the patient had major surgery during 100 days prior to admission? No  Current Medications   Current Facility-Administered  Medications:  .   stroke: mapping our early stages of recovery book, , Does not apply, Once, Rinehuls, David L, PA-C .  0.9 %  sodium chloride infusion, , Intravenous, Continuous, Rinehuls, Early Chars, PA-C, Last Rate: 50 mL/hr at 11/20/17 2138 .  acetaminophen (TYLENOL) tablet 650 mg, 650 mg, Oral, Q4H PRN, 650 mg at 11/17/17 2111 **OR** acetaminophen (TYLENOL) solution 650 mg, 650 mg, Per Tube, Q4H PRN **OR** acetaminophen (TYLENOL) suppository 650 mg, 650 mg, Rectal, Q4H PRN, Rinehuls, David L, PA-C .  aspirin EC tablet 325 mg, 325 mg, Oral, Daily, 325 mg at 11/21/17 1124 **OR** aspirin suppository 300 mg, 300 mg, Rectal, Daily, Rinehuls, David L, PA-C, 300 mg at 11/17/17 0835 .  carvedilol (COREG) tablet 25 mg, 25 mg, Oral, BID WC, Kerney Elbe, MD, 25 mg at 11/21/17 1124 .  divalproex (DEPAKOTE SPRINKLE) capsule 250 mg, 250 mg, Oral, Q8H, Biby, Sharon L, NP .  enoxaparin (LOVENOX) injection 40 mg, 40 mg, Subcutaneous, Q24H, Rinehuls, David L, PA-C, 40 mg at 11/21/17 1123 .  hydrochlorothiazide (MICROZIDE) capsule 12.5 mg, 12.5 mg, Oral, Daily, Rinehuls, David L, PA-C, 12.5 mg at 11/21/17 1124 .  labetalol (NORMODYNE,TRANDATE) injection 20 mg, 20 mg, Intravenous, Q10 min PRN, Biby, Sharon L, NP, 20 mg at 11/20/17 1104 .  MEDLINE mouth rinse, 15 mL, Mouth Rinse, BID, Rinehuls, David L, PA-C, 15 mL at 11/21/17 1133 .  nicotine (NICODERM CQ - dosed in mg/24 hr) patch 7 mg, 7 mg, Transdermal, Daily, Rinehuls, David L, PA-C, 7 mg at 11/21/17 1124 .  pravastatin (PRAVACHOL) tablet 20 mg, 20 mg, Oral, q1800, Biby, Sharon L, NP .  QUEtiapine (SEROQUEL) tablet 12.5 mg, 12.5 mg, Oral, Daily, Biby, Sharon L, NP, 12.5 mg at 11/21/17 1124 .  QUEtiapine (SEROQUEL) tablet 25 mg, 25 mg, Oral, QHS, Biby, Sharon L, NP, 25 mg at 11/20/17 2129 .  RESOURCE THICKENUP CLEAR, , Oral, PRN, Rinehuls, David L, PA-C .  senna-docusate (Senokot-S) tablet 1 tablet, 1 tablet, Oral, QHS PRN, Rinehuls, Early Chars, PA-C  Patients  Current Diet:       Diet Order           DIET - DYS 1 Room service appropriate? 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 No confusion  Home Assistive Devices / Equipment Home Assistive Devices/Equipment: None Home Equipment: Walker - 2 wheels, Transport chair, Bedside commode  Prior Device Use: Indicate devices/aids used by the patient prior to current illness, exacerbation or injury? None of the above  Prior Functional Level Prior Function Level of Independence: Independent Comments: very independent, 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 Pt has 4 kids who can assist  Self Care: Did the patient need help bathing, dressing, using the toilet or eating?  Independent  Indoor Mobility: Did the patient need assistance with walking from room to room (with or without device)? Independent  Stairs: Did the patient need assistance with internal or external stairs (with or without device)? Independent  Functional Cognition: Did the patient need help planning regular tasks such as shopping or remembering to take medications? Independent  Current Functional Level Cognition  Arousal/Alertness: Awake/alert Overall Cognitive Status: Impaired/Different from baseline Current Attention Level: Focused Orientation Level: Oriented to person, Disoriented to place, Disoriented to time, Disoriented to situation Following Commands: Follows one step commands inconsistently, Follows one step commands with increased time Safety/Judgement: Decreased awareness of safety, Decreased awareness of deficits General Comments: able to  follow some commands with tactile and verbal cueing to ellicit appropriate responses Attention: Focused, Sustained Focused Attention: Appears intact Sustained Attention: Impaired Memory: Impaired Memory Impairment: Decreased short term memory Decreased Short Term Memory: Verbal basic Awareness: Impaired Awareness Impairment: Intellectual impairment Problem Solving: Impaired Problem Solving Impairment: Verbal basic, Functional basic Behaviors: Restless Safety/Judgment: Impaired    Extremity Assessment (includes Sensation/Coordination)  Upper Extremity Assessment: Generalized weakness, LUE deficits/detail LUE Deficits / Details: Decreased attention to L side of body. Spontaneous active movement.   Lower Extremity Assessment: Defer to PT evaluation RLE Deficits / Details: grossly 3/5 strength but pt resisting knee flexion on command and extending, maintaining weight bearing in standing LLE Deficits / Details: grossly 3/5, able to bend knee at times on command in supine, requires blocking in standing with slight buckling, unable to fully assess due to command following    ADLs  Overall ADL's : Needs assistance/impaired Eating/Feeding: NPO Eating/Feeding Details (indicate cue type and reason): dentures removed. pt with only bottom plate and son uncertain where the top plate is located Upper Body Bathing: Maximal assistance Lower Body Bathing: Total assistance Upper Body Dressing : Maximal assistance Lower Body Dressing: Total assistance, Bed level Toilet Transfer: +2 for physical assistance, Maximal assistance Toilet Transfer Details (indicate cue type and reason): requires (A) to shift weight toward chair and bd Toileting- Clothing Manipulation and Hygiene: Total assistance, Sit to/from stand General ADL Comments: Simulated ADL participation this session. Pt able to cross midline gaze to hold for 10-15 seconds today.     Mobility  Overal bed mobility: Needs Assistance Bed  Mobility: Supine to Sit Rolling: Total assist, +2 for physical assistance Sidelying to sit: Total assist, +2 for physical assistance Supine to sit: Total assist, +2 for physical  assistance General bed mobility comments: Total A +2; requires physical assist from PT to bring LE towards EOB (L); attempts to go to R side of bed with max cueing to come towards L; use of bed pad for trunk/hip righting    Transfers  Overall transfer level: Needs assistance Equipment used: 2 person hand held assist Transfers: Sit to/from Stand, Stand Pivot Transfers Sit to Stand: Max assist, +2 physical assistance Stand pivot transfers: Max assist, +2 physical assistance General transfer comment: Sit to stand x 3 for ~2 min each trial for peri-care; on 3rd attempt does seem to bear more weight through B LE but does require B knee blocking during static stand due to reduced hip/knee flexion needed for stable upright balance    Ambulation / Gait / Stairs / Wheelchair Mobility  Ambulation/Gait General Gait Details: unable    Posture / Balance Dynamic Sitting Balance Sitting balance - Comments: assist ranging from max to min guard; prefers L UE to be behind back with with max tactile cueing to use UE for support; R UE reaching for end of bed for balance and scoots hips towards EOB/end of bed Balance Overall balance assessment: Needs assistance Sitting-balance support: Single extremity supported, Feet supported Sitting balance-Leahy Scale: Poor Sitting balance - Comments: assist ranging from max to min guard; prefers L UE to be behind back with with max tactile cueing to use UE for support; R UE reaching for end of bed for balance and scoots hips towards EOB/end of bed Postural control: Posterior lean, Left lateral lean Standing balance support: Bilateral upper extremity supported Standing balance-Leahy Scale: Zero Standing balance comment: B UE support in standing as well as knee blocking with max cueing for  improved forward gaze and hip/knee extension    Special needs/care consideration BiPAP/CPAP  N/a CPM  N/a Continuous Drip IV IVF at 50 cc/hr Dialysis  N/a Life Vest  N/a Oxygen  N/a Special Bed n/a Trach Size  N/a Wound Vac  N/a Skin ecchymosis to bilateral UE Bowel mgmt: incontinent LBM 7/23 Bladder mgmt: external catheter Diabetic mgmt yes patient with delirium with multiple med changes. Pta, no confusion, independent. Family needs a lot of education on handling environmental factors to decrease stimuli     Previous Home Environment Living Arrangements: Spouse/significant other  Lives With: Spouse Available Help at Discharge: Family, Available 24 hours/day Type of Home: House Home Layout: One level Home Access: Ramped entrance Bathroom Shower/Tub: Walk-in shower, Chiropodist: Standard Bathroom Accessibility: Yes How Accessible: Accessible via walker Kake: No Additional Comments: pt was primary caregiver for wife who does not walk, pt would transfer her and family assists with bathing  Discharge Living Setting Plans for Discharge Living Setting: Patient's home, Lives with (comment)(wife) Type of Home at Discharge: House Discharge Home Layout: One level Discharge Home Access: [place] entrance Discharge Bathroom Shower/Tub: Walk-in shower Discharge Bathroom Toilet: Standard Discharge Bathroom Accessibility: Yes How Accessible: Accessible via walker Does the patient have any problems obtaining your medications?: No  Social/Family/Support Systems Patient Roles: Spouse, Caregiver(pt was primary caregiver for his wife) Sport and exercise psychologist Information: Abe People and Herbie Baltimore per Herbie Baltimore Anticipated Caregiver: family Anticipated Ambulance person Information: see above Ability/Limitations of Caregiver: Ismail retired Building control surveyor Availability: 24/7 Discharge Plan Discussed with Primary Caregiver: Yes Is Caregiver In Agreement with Plan?: Yes Does  Caregiver/Family have Issues with Lodging/Transportation while Pt is in Rehab?: No   Goals/Additional Needs Patient/Family Goal for Rehab: supervision to min assist with PT, OT, and SLP Expected length of  stay: ELOS 19 to 24 days Special Service Needs: patietn typically is alert and ortiented. Any he is hospitalized he becomes very confused per Herbie Baltimore Pt/Family Agrees to Admission and willing to participate: Yes Program Orientation Provided & Reviewed with Pt/Caregiver Including Roles  & Responsibilities: Yes  Barriers to Discharge: Behavior(always becomes confused when hospitalized)  Decrease burden of Care through IP rehab admission:  N/a  Possible need for SNF placement upon discharge:not anticipated; son, Dakota [name] states pt would go home with family support no matter what for he only gets confused when hospitalized.  Patient Condition: This patient's medical and functional status has changed since the consult dated: 11/17/2017 in which the Rehabilitation Physician determined and documented that the patient's condition is appropriate for intensive rehabilitative care in an inpatient rehabilitation facility. See "History of Present Illness" (above) for medical update. Functional changes are: overall max to total assist. Patient's medical and functional status update has been discussed with the Rehabilitation physician and patient remains appropriate for inpatient rehabilitation. Will admit to inpatient rehab today.  Preadmission Screen Completed By:  Cleatrice Burke, 11/21/2017 1:52 PM ______________________________________________________________________   Discussed status with Dr. Posey Pronto on 11/21/2017 at  1405 and received telephone approval for admission today.  Admission Coordinator:  Cleatrice Burke, time [id number] Date 11/21/2017         Revision History

## 2017-11-21 NOTE — Progress Notes (Signed)
ANTICOAGULATION CONSULT NOTE - Initial Consult  Pharmacy Consult for Eliquis Indication: atrial fibrillation and new stroke  Allergies  Allergen Reactions  . Warfarin And Related Other (See Comments)    Lost a lot of weight and also experienced numbness  . Atorvastatin Other (See Comments)    REACTION: MUSCLE PAIN  . Ezetimibe Other (See Comments)    REACTION: MUSCLE PAIN  . Diazepam Other (See Comments)    UNKNOWN, per pt  . Penicillins Other (See Comments)    UNKNOWN, per pt    Patient Measurements: Height: 5\' 7"  (170.2 cm) Weight: 180 lb 1.9 oz (81.7 kg) IBW/kg (Calculated) : 66.1  Vital Signs: Temp: 98 F (36.7 C) (07/23 1707) Temp Source: Axillary (07/23 1707) BP: 147/78 (07/23 1707) Pulse Rate: 88 (07/23 1707)  Labs: Recent Labs    11/19/17 0251 11/20/17 0439 11/21/17 0347  HGB 15.0 16.8 16.1  HCT 45.9 48.8 48.0  PLT 173 179 183  CREATININE 0.91 0.92 0.90    Estimated Creatinine Clearance: 60.3 mL/min (by C-G formula based on SCr of 0.9 mg/dL).   Medical History: Past Medical History:  Diagnosis Date  . Anxiety   . Back pain, chronic   . Complete heart block ([place]) 07/18/2014   Medtronic Walker model [id number] (serial number [id number])  singe lead PPM  . Esophageal obstruction due to food impaction 2017  . GERD (gastroesophageal reflux disease) 10/2002  . Hyperlipidemia 12/1994  . Hypertension   . Hypokalemia   . Lung collapse 03/1991   Fall  . Permanent atrial fibrillation ([name])    Refused coumadin therapy    Assessment: 88 YOM admitteed on 7/18 with acute stroke d/t afib, transferred to CIR this evening. Neuro recommended to start Eliquis on hospital D#5 if pt is stable.  Full dose aspirin and prophylactic lovenox (last dose at ~ noon) has been d/c'd at transfer. Scr = 0.9, est. crcl ~ 60 ml/min.   Goal of Therapy:   Monitor platelets by anticoagulation protocol: Yes   Plan:  Eliquis 5mg  PO BID start tonight at [place],  PharmD, BCPS, BCPPS Clinical Pharmacist  Pager: [phone]   11/21/2017,5:51 PM

## 2017-11-21 NOTE — H&P (Signed)
Physical Medicine and Rehabilitation Admission H&P    Chief Complaint  Patient presents with  .  Functional deficits due to stroke    HPI: Dakota [name] is a 84 year old male with history of hypertension, complete heart block status post permanent pacemaker, GERD/Schatzki's ring, PAF-refused Coumadin; who was admitted on 11/16/2017 with left-sided weakness, right gaze preference, slurred speech and mental status changes.  History taken from chart review and son. CT head reviewed, showing right basal ganglia infarct.  Per report, acute to subacute right basal ganglia infarct with concern of distal right MCA thrombus.  CTA/P head done revealing a right MCA territory penumbra with moderate to severe M3 branch irregularity.  He underwent cerebral angio with revascularization of the right MCA M2 segment by Dr. Estanislado Pandy.  Patient continued to have issues with confusion and delirium.  Repeat CT head 7/19 showed propagation of acute right MCA territory infarct involving right insula and right temporoparietal lobe.  Dense right insular MCA consistent with thromboembolism.  2D echo done showing EF of 65 to 70% with no wall abnormality, moderately dilated left atrium and no cardiac source of emboli.  Dr. Erlinda Hong felt that stroke was embolic due to A. Fib and plans to start Eliquis 5 mg p.o. twice daily on post hospital day 5 if patient stable.  Patient has continued to have issues with delirium, sleep wake cycle disruption and agitation.  Seroquel was added with without improvement therefore Depakote added additionally today.  Patient continues to be limited by left-sided weakness with right gaze preference, requires tactile and verbal cues to follow occasionally one-step command, short-term memory deficits, lethargy and oral phase dysphagia with max cues to initiate swallow. CIR recommended due to functional decline.    Review of Systems  Unable to perform ROS: Mental acuity    Past Medical History:    Diagnosis Date  . Anxiety   . Back pain, chronic   . Complete heart block ([place]) 07/18/2014   Medtronic [place] model [id number] (serial number [id number])  singe lead PPM  . Esophageal obstruction due to food impaction 2017  . GERD (gastroesophageal reflux disease) 10/2002  . Hyperlipidemia 12/1994  . Hypertension   . Hypokalemia   . Lung collapse 03/1991   Fall  . Permanent atrial fibrillation ([name])    Refused coumadin therapy    Past Surgical History:  Procedure Laterality Date  . BACK SURGERY  11/91   with hardware fixation  . CARDIAC CATHETERIZATION  06/2004   30 % stenosis, EF normal  . ESOPHAGOGASTRODUODENOSCOPY N/A 01/06/2016   Procedure: ESOPHAGOGASTRODUODENOSCOPY (EGD);  Surgeon: Otis Brace, MD;  Location: [facility];  Service: Gastroenterology;  Laterality: N/A;  . EYE SURGERY     Retinal bubble surgery, cataract   . IR CT HEAD LTD  11/16/2017  . IR PERCUTANEOUS ART THROMBECTOMY/INFUSION INTRACRANIAL INC DIAG ANGIO  11/16/2017  . LEFT HEART CATHETERIZATION WITH CORONARY ANGIOGRAM N/A 07/18/2014   Procedure: LEFT HEART CATHETERIZATION WITH CORONARY ANGIOGRAM;  Surgeon: Jettie Booze, MD; [facility] [place], LAD mild dz, D1 80%, D2 OK, CFX system OK, RCA OK, PDA 100%, med rx  . PERMANENT PACEMAKER INSERTION N/A 07/18/2014   Procedure: PERMANENT PACEMAKER INSERTION;  Surgeon: Thompson Grayer, MD; Medtronic [place] model [phone] (serial number [id number])    . RADIOLOGY WITH ANESTHESIA N/A 11/16/2017   Procedure: RADIOLOGY WITH ANESTHESIA;  Surgeon: Luanne Bras, MD;  Location: [facility];  Service: Radiology;  Laterality: N/A;  . TEMPORARY PACEMAKER INSERTION N/A 07/18/2014   Procedure: TEMPORARY PACEMAKER  INSERTION;  Surgeon: Peter M Martinique, MD;  Location: [facility] CATH LAB;  Service: Cardiovascular;  Laterality: N/A;    Family History  Problem Relation Age of Onset  . Pneumonia Mother   . Hip fracture Mother   . Cancer Father        splenic  . Heart disease Sister        Pacer  placed  . Cancer Brother        throat and lung  . Cancer Brother        prostate, age 42  . Prostate cancer Brother   . Heart disease Son   . Stroke Neg Hx   . 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 pe reports that he quit smoking about 64 years ago. He has a 15.00 pack-year smoking history. He chews "all the time". Per reports that he does not drink alcohol or use drugs.    Allergies  Allergen Reactions  . Warfarin And Related Other (See Comments)    Lost a lot of weight and also experienced numbness  . Atorvastatin Other (See Comments)    REACTION: MUSCLE PAIN  . Ezetimibe Other (See Comments)    REACTION: MUSCLE PAIN  . Diazepam Other (See Comments)    UNKNOWN, per pt  . Penicillins Other (See Comments)    UNKNOWN, per pt    Medications Prior to Admission  Medication Sig Dispense Refill  . ALPRAZolam (XANAX) 0.5 MG tablet TAKE ONE-HALF TABLET BY MOUTH TWICE DAILY AS NEEDED FOR ANXIETY (Patient taking differently: Take 0.25 mg by mouth 2 (two) times daily as needed for anxiety. TAKE ONE-HALF TABLET BY MOUTH TWICE DAILY AS NEEDED FOR ANXIETY) 60 tablet 0  . aspirin 81 MG tablet Take 81 mg by mouth 2 (two) times daily.     . calcium carbonate (TUMS - DOSED IN MG ELEMENTAL CALCIUM) 500 MG chewable tablet Chew 1 tablet by mouth daily as needed for indigestion.     . carvedilol (COREG) 25 MG tablet TAKE 1 TABLET BY MOUTH TWICE DAILY WITH  A  MEAL. 180 tablet 1  . docusate sodium (COLACE) 100 MG capsule Take 1 capsule (100 mg total) by mouth daily as needed for mild constipation.    . hydrochlorothiazide (MICROZIDE) 12.5 MG capsule TAKE ONE CAPSULE BY MOUTH ONCE DAILY 90 capsule 1  . Magnesium Hydroxide (MILK OF MAGNESIA PO) Take 1 tablet by mouth daily as needed.    . nitroGLYCERIN (NITROSTAT) 0.4 MG SL tablet Place 1 tablet (0.4 mg total) under the tongue every 5 (five) minutes as needed for chest pain. 25 tablet 3  . pantoprazole (PROTONIX) 40  MG tablet Take 1 tablet (40 mg total) by mouth daily. 30 tablet 0  . ranitidine (ZANTAC) 150 MG tablet TAKE 1 TABLET BY MOUTH ONCE DAILY 90 tablet 1    Drug Regimen Review  Drug regimen was reviewed and remains appropriate with no significant issues identified  Home: Home Living Family/patient expects to be discharged to:: Private residence Living Arrangements: Spouse/significant other Available Help at Discharge: Family, Available 24 hours/day Type of Home: House Home Access: Ramped entrance Home Layout: One level Bathroom Shower/Tub: Gaffer, Chiropodist: Standard Bathroom Accessibility: (P) Yes Home Equipment: Walker - 2 wheels, Transport chair, Bedside commode Additional Comments: pt was primary caregiver for wife who does not walk, pt would transfer her and family assists with bathing  Lives With: (P) Spouse   Functional  History: Prior Function Level of Independence: Independent Comments: very independent, 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 Pt has 4 kids who can assist  Functional Status:  Mobility: Bed Mobility Overal bed mobility: Needs Assistance Bed Mobility: Supine to Sit Rolling: Total assist, +2 for physical assistance Sidelying to sit: Total assist, +2 for physical assistance Supine to sit: Total assist, +2 for physical assistance General bed mobility comments: Total A +2; requires physical assist from PT to bring LE towards EOB (L); attempts to go to R side of bed with max cueing to come towards L; use of bed pad for trunk/hip righting Transfers Overall transfer level: Needs assistance Equipment used: 2 person hand held assist Transfers: Sit to/from Stand, Stand Pivot Transfers Sit to Stand: Max assist, +2 physical assistance Stand pivot transfers: Max assist, +2 physical assistance General transfer comment: Sit to stand x 3 for ~2 min each trial for peri-care; on 3rd attempt does seem to bear more weight through B LE but does  require B knee blocking during static stand due to reduced hip/knee flexion needed for stable upright balance Ambulation/Gait General Gait Details: unable    ADL: ADL Overall ADL's : Needs assistance/impaired Eating/Feeding: NPO Eating/Feeding Details (indicate cue type and reason): dentures removed. pt with only bottom plate and son uncertain where the top plate is located Upper Body Bathing: Maximal assistance Lower Body Bathing: Total assistance Upper Body Dressing : Maximal assistance Lower Body Dressing: Total assistance, Bed level Toilet Transfer: +2 for physical assistance, Maximal assistance Toilet Transfer Details (indicate cue type and reason): requires (A) to shift weight toward chair and bd Toileting- Clothing Manipulation and Hygiene: Total assistance, Sit to/from stand General ADL Comments: Simulated ADL participation this session. Pt able to cross midline gaze to hold for 10-15 seconds today.   Cognition: Cognition Overall Cognitive Status: Impaired/Different from baseline Arousal/Alertness: Awake/alert Orientation Level: Oriented to person, Disoriented to place, Disoriented to time, Disoriented to situation Attention: Focused, Sustained Focused Attention: Appears intact Sustained Attention: Impaired Memory: Impaired Memory Impairment: Decreased short term memory Decreased Short Term Memory: Verbal basic Awareness: Impaired Awareness Impairment: Intellectual impairment Problem Solving: Impaired Problem Solving Impairment: Verbal basic, Functional basic Behaviors: Restless Safety/Judgment: Impaired Cognition Arousal/Alertness: Awake/alert Behavior During Therapy: Restless, Flat affect Overall Cognitive Status: Impaired/Different from baseline Area of Impairment: Orientation, Attention, Memory, Following commands, Safety/judgement, Problem solving Orientation Level: Disoriented to, Place, Time, Situation Current Attention Level: Focused Memory: Decreased  short-term memory Following Commands: Follows one step commands inconsistently, Follows one step commands with increased time Safety/Judgement: Decreased awareness of safety, Decreased awareness of deficits Awareness: Intellectual Problem Solving: Slow processing, Decreased initiation, Difficulty sequencing, Requires verbal cues, Requires tactile cues General Comments: able to follow some commands with tactile and verbal cueing to ellicit appropriate responses   Blood pressure (!) 169/85, pulse (!) 107, temperature 98.9 F (37.2 C), temperature source Oral, resp. rate 19, height '5\' 7"'  (1.702 m), weight 85.5 kg (188 lb 7.9 oz), SpO2 93 %. Physical Exam  Nursing note and vitals reviewed. Constitutional: He appears well-developed and well-nourished.  Frail male, slumped to the left with right gaze preference. RUE in restraints  HENT:  Head: Normocephalic and atraumatic.  Tongue with dry flaky residue. Lower dentures loose in mouth.   Eyes: Right eye exhibits no discharge. Left eye exhibits no discharge.  Neck: Normal range of motion. Neck supple.  Cardiovascular: An irregularly irregular rhythm present. Tachycardia present.  Irregularly irregular  Respiratory: Effort normal and breath sounds normal.  GI: Soft. Bowel  sounds are normal.  Musculoskeletal:  No edema or tenderness in extremities. Multiple healing abrasions bilateral shins. Well healed laceration right elbow/forearm.   Neurological:  Lethargic with severely dysarthric and garbled speech at times due to loose dentures. Able to state name and DOB. He was able to follow occasional motor commands but easily distracted. Left neglect with left sided weakness--unable to participate in MMT.   Skin: Skin is warm and dry.  Psychiatric:  Anxious appearing--asking about his wife. Difficulty to fully assess due to mentation    Results for orders placed or performed during the hospital encounter of 11/16/17 (from the past 48 hour(s))  CBC      Status: Abnormal   Collection Time: 11/20/17  4:39 AM  Result Value Ref Range   WBC 16.9 (H) 4.0 - 10.5 K/uL   RBC 5.61 4.22 - 5.81 MIL/uL   Hemoglobin 16.8 13.0 - 17.0 g/dL   HCT 48.8 39.0 - 52.0 %   MCV 87.0 78.0 - 100.0 fL   MCH 29.9 26.0 - 34.0 pg   MCHC 34.4 30.0 - 36.0 g/dL   RDW 12.5 11.5 - 15.5 %   Platelets 179 150 - 400 K/uL    Comment: Performed at [facility], [place] [address] [place], [place] [address]  Basic metabolic panel     Status: Abnormal   Collection Time: 11/20/17  4:39 AM  Result Value Ref Range   Sodium 134 (L) 135 - 145 mmol/L   Potassium 4.0 3.5 - 5.1 mmol/L   Chloride 103 98 - 111 mmol/L    Comment: Please note change in reference range.   CO2 24 22 - 32 mmol/L   Glucose, Bld 129 (H) 70 - 99 mg/dL    Comment: Please note change in reference range.   BUN 26 (H) 8 - 23 mg/dL    Comment: Please note change in reference range.   Creatinine, Ser 0.92 0.61 - 1.24 mg/dL   Calcium 9.1 8.9 - 10.3 mg/dL   GFR calc non Af Amer >60 >60 mL/min   GFR calc Af Amer >60 >60 mL/min    Comment: (NOTE) The eGFR has been calculated using the CKD EPI equation. This calculation has not been validated in all clinical situations. eGFR's persistently <60 mL/min signify possible Chronic Kidney Disease.    Anion gap 7 5 - 15    Comment: Performed at [facility] [address] [place], [place] [address]  CBC     Status: Abnormal   Collection Time: 11/21/17  3:47 AM  Result Value Ref Range   WBC 18.4 (H) 4.0 - 10.5 K/uL   RBC 5.43 4.22 - 5.81 MIL/uL   Hemoglobin 16.1 13.0 - 17.0 g/dL   HCT 48.0 39.0 - 52.0 %   MCV 88.4 78.0 - 100.0 fL   MCH 29.7 26.0 - 34.0 pg   MCHC 33.5 30.0 - 36.0 g/dL   RDW 12.8 11.5 - 15.5 %   Platelets 183 150 - 400 K/uL    Comment: Performed at [facility], [place] [address] [place], [place] [address]  Basic metabolic panel     Status: Abnormal   Collection Time: 11/21/17  3:47 AM  Result Value Ref Range   Sodium 136 135  - 145 mmol/L   Potassium 3.7 3.5 - 5.1 mmol/L   Chloride 104 98 - 111 mmol/L   CO2 23 22 - 32 mmol/L   Glucose, Bld 123 (H) 70 - 99 mg/dL  BUN 33 (H) 8 - 23 mg/dL   Creatinine, Ser 0.90 0.61 - 1.24 mg/dL   Calcium 9.1 8.9 - 10.3 mg/dL   GFR calc non Af Amer >60 >60 mL/min   GFR calc Af Amer >60 >60 mL/min    Comment: (NOTE) The eGFR has been calculated using the CKD EPI equation. This calculation has not been validated in all clinical situations. eGFR's persistently <60 mL/min signify possible Chronic Kidney Disease.    Anion gap 9 5 - 15    Comment: Performed at [facility] [address], [place],  [address]   No results found.     Medical Problem List and Plan: 1.  Left-sided weakness, right gaze preference, short-term memory deficits, lethargy, and dysphagia secondary to right MCA infarct. 2.  DVT Prophylaxis/Anticoagulation: Pharmaceutical: Other (comment)--discussed with Neurology NP--to start Eliquis today.  3. Pain Management: tylenol prn.  4. Mood: LCSW to follow for evaluation  5. Neuropsych: This patient is not capable of making decisions on his own behalf. 6. Skin/Wound Care: routine pressure relief measures.  7. Fluids/Electrolytes/Nutrition: Monitor I/O. Check lytes in am. Change IVF to hs.  8. Hospital psychosis/Delirium: Family reports delirium with every admission which gets better once at home. Question unmasking of dementia complicated by sleep wake disruption as well as deficits due to stroke.  Will d/c am dose Seroquel. Depakote added today to help with agitation--monitor for SE. Will check sleep wake chart.  9. A fib: Monitor HR bid. On coreg bid.  10. Pre-renal azotemia: Despite ongoing IVF. Will change to HS. Will start calorie count to monitor intake. Offer magic cup with meals. 11. Progressive Leucocytosis: Will check UA/UCS and CXR  to rule out infection as cause of delirium.  12. HTN: Monitor BP bid. ON HCTZ and Coreg.     Post  Admission Physician Evaluation: 1. Preadmission assessment reviewed and changes made below. 2. Functional deficits secondary  to right MCA infarct. 3. Patient is admitted to receive collaborative, interdisciplinary care between the physiatrist, rehab nursing staff, and therapy team. 4. Patient's level of medical complexity and substantial therapy needs in context of that medical necessity cannot be provided at a lesser intensity of care such as a SNF. 5. Patient has experienced substantial functional loss from his/her baseline which was documented above under the "Functional History" and "Functional Status" headings.  Judging by the patient's diagnosis, physical exam, and functional history, the patient has potential for functional progress which will result in measurable gains while on inpatient rehab.  These gains will be of substantial and practical use upon discharge  in facilitating mobility and self-care at the household level. 39. Physiatrist will provide 24 hour management of medical needs as well as oversight of the therapy plan/treatment and provide guidance as appropriate regarding the interaction of the two. 7. 24 hour rehab nursing will assist with bladder management, bowel management, safety, skin/wound care, disease management, medication administration, pain management and patient education  and help integrate therapy concepts, techniques,education, etc. 8. PT will assess and treat for/with: Lower extremity strength, range of motion, stamina, balance, functional mobility, safety, adaptive techniques and equipment, wound care, coping skills, pain control, stroke education. Goals are: Mod A. 9. OT will assess and treat for/with: ADL's, functional mobility, safety, upper extremity strength, adaptive techniques and equipment, wound mgt, ego support, and community reintegration.   Goals are: Mod A. Therapy may proceed with showering this patient. 10. SLP will assess and treat for/with: speech,  swallowing, language, cognition.  Goals are: Min/Mod A. 11. Case Management and Social Worker will assess and treat for psychological issues and discharge planning. 12. Team conference will be held weekly to assess progress toward goals and to determine barriers to discharge. 13. Patient will receive at least 3 hours of therapy per day at least 5 days per week. 14. ELOS: 20-25 days.       15. Prognosis:  good  I have personally performed a face to face diagnostic evaluation, including, but not limited to relevant history and physical exam findings, of this patient and developed relevant assessment and plan.  Additionally, I have reviewed and concur with the physician assistant's documentation above.  Delice Lesch, MD, ABPMR Bary Leriche, PA-C 11/21/2017

## 2017-11-21 NOTE — Progress Notes (Signed)
Pt admitted to unit with family at bedside. RN educated family on rehab process and safety plan. Pt A&O to self only. Pt very drowsy and unable to answer questions at this time. Pt wants to sleep right now. Call bell within reach and bed alarm in place. Family still at bedside.

## 2017-11-21 NOTE — Progress Notes (Addendum)
STROKE TEAM PROGRESS NOTE   SUBJECTIVE (INTERVAL HISTORY) The patient's son was at the bedside. Reports pt stayed agitated throughout the day yesterday but improved with higher Seroquel dose at HS. Doing ok this am, only mild agitation. BP improved overall, still with one elevated reading. Pt now up in the chair. Will add additional calming med (depakote). CIR to see today.  OBJECTIVE Vitals:   11/20/17 2228 11/21/17 0228 11/21/17 0428 11/21/17 0440  BP: (!) 135/95 (!) 151/99 (!) 180/158 (!) 169/85  Pulse: 79 63 97 (!) 107  Resp: 17 20 (!) 21 19  Temp:      TempSrc:      SpO2: (!) 84% 90% 90% 93%  Weight:      Height:        CBC:  Recent Labs  Lab 11/16/17 1134 11/17/17 0500  11/20/17 0439 11/21/17 0347  WBC 14.9* 15.1*   < > 16.9* 18.4*  NEUTROABS 11.6* 12.8*  --   --   --   HGB 16.7 14.5   < > 16.8 16.1  HCT 51.5 44.7   < > 48.8 48.0  MCV 91.0 91.2   < > 87.0 88.4  PLT 204 196   < > 179 183   < > = values in this interval not displayed.    Basic Metabolic Panel:  Recent Labs  Lab 11/20/17 0439 11/21/17 0347  NA 134* 136  K 4.0 3.7  CL 103 104  CO2 24 23  GLUCOSE 129* 123*  BUN 26* 33*  CREATININE 0.92 0.90  CALCIUM 9.1 9.1    IMAGING No results found.    PHYSICAL EXAM (Per Dr. Leonie Man) Dakota [name] in no apparent distress.  Ophthalmologic - fundi not visualized due to noncooperation.  Cardiovascular - irregularly irregular heart rate and rhythm.  Neuro - drowsy but easily arousable, oriented to place and people, but not to time. Severe dysarthria but able to name and repeat. Following commands. PERRL, right gaze preference, not crossing midline. Left neglect, not blinking to visual threat on the left. Left facial droop. Tongue midline. Left UE 1/5 and LE 2/5 on pain stimulation. RUE and RLE 4/5. DTR 1+ and left babinski positive. Sensation symmetrical, coordination not cooperative and gait not tested.   ASSESSMENT/PLAN Mr.  Dakota [name] is a 84 y.o. [name] with history of permanent atrial fibrillation (not anticoagulated ), status post [employer] permanent pacemaker for complete heart block, hypertension, hyperlipidemia, chronic back pain, and anxiety presenting with left-sided weakness and right gaze preference. He did not receive IV t-PA due to late presentation. S/P thrombectomy Dr Estanislado Pandy.  Stroke: right MCA infarct, embolic secondary to atrial fibrillation not on [facility] s/p TICI2b revascularization of R MCA M2  Resultant  Left neglect, right gaze and left hemiparesis  CT head - Acute to subacute infarct in the right basal ganglia, external capsule, and posterior operculum. Right distal MCA branch hyperdensity near the operculum likely reflects thrombus.   CTA H&N - likely right M3 occlusion.   CT Perfusion - suggests posterior right MCA territory penumbra - (?? At least partial pseudo-normalization, exact size of penumbra is not clear)  DSA - right M2 occlusion s/p IR with TICI 2b+ reperfusion  MRI head - not able to do due to PPM  Repeat Head CT - Propagation of acute RT MCA territory infarct now involving RT insula and RT temporoparietal lobes. Similar RT basal ganglia acute infarct. Dense distal RT insular MCA c/w thromboembolism.  2D Echo -  EF 65 to 70%.  No cardiac source of emboli identified.  LDL - 102  HgbA1c - 6.1  VTE prophylaxis - Lovenox 40 mg sq daily   aspirin 81 mg daily prior to admission, now on aspirin suppository 300 mg or aspiring 325 po daily.  Plan to start Eliquis 5 mg p.o. twice daily on hospital day 5 if remains stable  Ongoing aggressive stroke risk factor management  Therapy recommendations:  CIR. okay for stroke standpoint for patient to be discharged to rehab when bed available  Disposition:  Pending  Afib not on AC  Hx of afib following with cardiology  Refused coumadin in the past due to ? Coumadin made leg weak  On ASA for now  Plan to start Eliquis 5 mg p.o.  twice daily on hospital day 5 if remains stable  In hospital delirium  Hx of in hospital delirium  Initially placed on seroquel 25mg  bid but became very sedated, so dose decreased to 12.5 bid  On lower dose, patient agitated and unable to participate in therapy due to elevated blood pressure.  Seroquel 12.5 mg every morning and 25 mg at bedtime  Add Depakote sprinkles 250 tid to assist with agitation  Hypertension . Initially treated with Cardene, now off  . BP goal < 180  . Home meds: HCTZ and coreg 25 bid, resumed  . Blood pressure better . as needed labetalol . Long-term BP goal normotensive  Hyperlipidemia  Lipid lowering medication PTA:  none  LDL 102, goal < 70  Muscle pain w/ atorvastatin and ezetimibe  Current lipid lowering medication: trial pravachol  Continue statin at discharge  May need to consider PSCK-9 as OP  Dysphagia   Severe dysarthria Diet Order           DIET - DYS 1 Room service appropriate? Yes; Fluid consistency: Nectar Thick  Diet effective now          Chewing tobacco  Put on nicotine patch  Could be part of the reason of delirium  Other Stroke Risk Factors  Advanced age  Former cigarette smoker - quit  Other Active Problems  Complete heart block s/p Permanent pacemaker  Renal insufficiency BUN 26 ; creatinine 0.90 -> improved  Leukocytosis -> 18.4 (afebrile) not on abxs. CXR 7/20 -  no pneumonia. UA 7/18 few bacteria but no WBC.   Hospital day # [place], MSN, APRN, ANVP-BC, AGPCNP-BC Advanced Practice Stroke Nurse [place] for Schedule & Pager information I have personally examined this patient, reviewed notes, independently viewed imaging studies, participated in medical decision making and plan of care.ROS completed by me personally and pertinent positives fully documented  I have made any additions or clarifications directly to the above note. Agree with note above.   Dakota Contras, MD Medical Director [facility] Pager: [phone] 11/21/2017 5:19 PM   To contact Stroke Continuity provider, please refer to [email or web address] After hours, contact General Neurology

## 2017-11-21 NOTE — Progress Notes (Signed)
Physical Medicine and Rehabilitation Consult  Reason for Consult: Functional deficits due to stroke.  Referring Physician: Dr. Erlinda Hong   HPI: Dakota [name] is a 84 y.o. male with history of HTN, CHB s/p PPM, GERD/Schatzki's ring, PAF- refused coumadin; who was admitted on 11/16/17 with left sided weakness, right gaze preference,  slurred speech and mental status changes.  CT head showed acute to subacute right basal ganglia infarct with concerns of distal R-MCA thrombus. CTA/P head done revealing R-MCA territory penumbra with moderate to severe  M3 branch irregularity. He underwent cerebral angio with revascularization of R-MCA M2 segment by Dr.Deveshwar.  Work up underway and Dr. Erlinda Hong felt that stroke embolic due to A fib. Therapy evaluations completed today and patient with significant limitations due to delirium, severe dysarthria, right gaze preference with left sided weakness. CIR recommended due to functional deficits.   Review of Systems  Unable to perform ROS: Mental acuity          Past Medical History:  Diagnosis Date  . Anxiety   . Back pain, chronic   . Complete heart block ([place]) 07/18/2014   [employer] [place] model [id number] (serial number [id number])  singe lead PPM  . GERD (gastroesophageal reflux disease) 10/2002  . Hyperlipidemia 12/1994  . Hypertension   . Hypokalemia   . Lung collapse 03/1991   Fall  . Permanent atrial fibrillation ([name])    Refused coumadin therapy         Past Surgical History:  Procedure Laterality Date  . BACK SURGERY  11/91   with hardware fixation  . CARDIAC CATHETERIZATION  06/2004   30 % stenosis, EF normal  . ESOPHAGOGASTRODUODENOSCOPY N/A 01/06/2016   Procedure: ESOPHAGOGASTRODUODENOSCOPY (EGD);  Surgeon: Otis Brace, MD;  Location: [place];  Service: Gastroenterology;  Laterality: N/A;  . EYE SURGERY     Retinal bubble surgery, cataract   . LEFT HEART CATHETERIZATION WITH CORONARY ANGIOGRAM N/A  07/18/2014   Procedure: LEFT HEART CATHETERIZATION WITH CORONARY ANGIOGRAM;  Surgeon: Jettie Booze, MD; [facility] [place], LAD mild dz, D1 80%, D2 OK, CFX system OK, RCA OK, PDA 100%, med rx  . PERMANENT PACEMAKER INSERTION N/A 07/18/2014   Procedure: PERMANENT PACEMAKER INSERTION;  Surgeon: Thompson Grayer, MD; [employer] [place] model [phone] (serial number [id number])    . RADIOLOGY WITH ANESTHESIA N/A 11/16/2017   Procedure: RADIOLOGY WITH ANESTHESIA;  Surgeon: Luanne Bras, MD;  Location: [place];  Service: Radiology;  Laterality: N/A;  . TEMPORARY PACEMAKER INSERTION N/A 07/18/2014   Procedure: TEMPORARY PACEMAKER INSERTION;  Surgeon: Peter M Martinique, MD;  Location: [facility] CATH LAB;  Service: Cardiovascular;  Laterality: N/A;         Family History  Problem Relation Age of Onset  . Pneumonia Mother   . Hip fracture Mother   . Cancer Father        splenic  . Heart disease Sister        Pacer placed  . Cancer Brother        throat and lung  . Cancer Brother        prostate, age 85  . Prostate cancer Brother   . Heart disease Son   . Stroke Neg Hx   . Colon cancer Neg Hx     Social History:  Married and is the caregiver for his wife who is bedbound?. Per reports that he quit smoking about 64 years ago. He has a 15.00 pack-year smoking history. His smokeless tobacco use includes chew. Per  reports that he does not drink alcohol or use drugs.          Allergies  Allergen Reactions  . Warfarin And Related Other (See Comments)    Lost a lot of weight and also experienced numbness  . Atorvastatin Other (See Comments)    REACTION: MUSCLE PAIN  . Ezetimibe Other (See Comments)    REACTION: MUSCLE PAIN  . Diazepam Other (See Comments)    UNKNOWN, per pt  . Penicillins Other (See Comments)    UNKNOWN, per pt         Medications Prior to Admission  Medication Sig Dispense Refill  . ALPRAZolam (XANAX) 0.5 MG tablet TAKE ONE-HALF TABLET BY MOUTH TWICE DAILY  AS NEEDED FOR ANXIETY (Patient taking differently: Take 0.25 mg by mouth 2 (two) times daily as needed for anxiety. TAKE ONE-HALF TABLET BY MOUTH TWICE DAILY AS NEEDED FOR ANXIETY) 60 tablet 0  . aspirin 81 MG tablet Take 81 mg by mouth 2 (two) times daily.     . calcium carbonate (TUMS - DOSED IN MG ELEMENTAL CALCIUM) 500 MG chewable tablet Chew 1 tablet by mouth daily as needed for indigestion.     . carvedilol (COREG) 25 MG tablet TAKE 1 TABLET BY MOUTH TWICE DAILY WITH  A  MEAL. 180 tablet 1  . docusate sodium (COLACE) 100 MG capsule Take 1 capsule (100 mg total) by mouth daily as needed for mild constipation.    . hydrochlorothiazide (MICROZIDE) 12.5 MG capsule TAKE ONE CAPSULE BY MOUTH ONCE DAILY 90 capsule 1  . Magnesium Hydroxide (MILK OF MAGNESIA PO) Take 1 tablet by mouth daily as needed.    . nitroGLYCERIN (NITROSTAT) 0.4 MG SL tablet Place 1 tablet (0.4 mg total) under the tongue every 5 (five) minutes as needed for chest pain. 25 tablet 3  . pantoprazole (PROTONIX) 40 MG tablet Take 1 tablet (40 mg total) by mouth daily. 30 tablet 0  . ranitidine (ZANTAC) 150 MG tablet TAKE 1 TABLET BY MOUTH ONCE DAILY 90 tablet 1    Home: Home Living Family/patient expects to be discharged to:: Private residence Living Arrangements: Spouse/significant other Available Help at Discharge: Family, Available 24 hours/day Type of Home: House Home Access: Ramped entrance Home Layout: One level Bathroom Shower/Tub: Gaffer, Chiropodist: Standard Home Equipment: Environmental consultant - 2 wheels, Transport chair, Bedside commode Additional Comments: pt was primary caregiver for wife who does not walk, pt would transfer her and family assists with bathing  Functional History: Prior Function Level of Independence: Independent Comments: very independent, 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 Pt has 4 kids who can assist Functional Status:  Mobility: Bed Mobility Overal bed  mobility: Needs Assistance Bed Mobility: Rolling, Sidelying to Sit Rolling: Max assist, +2 for safety/equipment Sidelying to sit: Max assist, +2 for physical assistance, HOB elevated General bed mobility comments: max assist to bend left knee, move LUE onto chest and roll to right, pt assisting with RUE. Assist to elevate trunk and bring legs off of bed Transfers Overall transfer level: Needs assistance Transfers: Sit to/from Stand, Stand Pivot Transfers Sit to Stand: Mod assist, +2 physical assistance Stand pivot transfers: Max assist, +2 physical assistance General transfer comment: pt stood from bed and chair with left knee blocked and pt activating bil quad and rising with cues to initiate and assist at belt and trunk for stability. Pt able to pivot to right from bed<>chair with assist for balance, stepping and sequence. Pt with blocking left  knee and facilitation to step and extend trunk.  Ambulation/Gait General Gait Details: unable  ADL: ADL Overall ADL's : Needs assistance/impaired Eating/Feeding: NPO Eating/Feeding Details (indicate cue type and reason): dentures removed. pt with only bottom plate and son uncertain where the top plate is located Upper Body Bathing: Maximal assistance Lower Body Bathing: Total assistance Upper Body Dressing : Maximal assistance Lower Body Dressing: Total assistance Toilet Transfer: +2 for physical assistance, Maximal assistance Toilet Transfer Details (indicate cue type and reason): requires (A) to shift weight toward chair and bd General ADL Comments: simulated OOB to chair. Pt positioned in chair and RN arriving to place patient back in bed for MRI. pt transfered back to bed surface. Encouragement for staff to get patient OOB for all meal  Cognition: Cognition Overall Cognitive Status: Impaired/Different from baseline Arousal/Alertness: Awake/alert Orientation Level: Oriented to person, Disoriented to situation, Disoriented to time,  Disoriented to place Attention: Focused, Sustained Focused Attention: Appears intact Sustained Attention: Impaired Memory: Impaired Memory Impairment: Decreased short term memory Decreased Short Term Memory: Verbal basic Awareness: Impaired Awareness Impairment: Intellectual impairment Problem Solving: Impaired Problem Solving Impairment: Verbal basic, Functional basic Behaviors: Restless Safety/Judgment: Impaired Cognition Arousal/Alertness: Awake/alert Behavior During Therapy: Restless Overall Cognitive Status: Impaired/Different from baseline Area of Impairment: Orientation, Attention, Memory, Following commands, Safety/judgement, Awareness, Problem solving Orientation Level: Disoriented to, Time, Place Current Attention Level: Focused Memory: Decreased short-term memory Following Commands: Follows one step commands inconsistently Safety/Judgement: Decreased awareness of safety, Decreased awareness of deficits Problem Solving: Slow processing, Decreased initiation, Difficulty sequencing, Requires verbal cues, Requires tactile cues General Comments: pt stating he is in [place], hospital with spot on his brain. Pt stating year as 2010 but able to state correct age, states month as October, right gaze preference  Blood pressure 123/66, pulse 78, temperature 99.2 F (37.3 C), resp. rate 20, height 5\' 8"  (1.727 m), weight 87.5 kg (192 lb 14.4 oz), SpO2 100 %. Physical Exam  Constitutional: No distress.  HENT:  Head: Normocephalic.  Eyes: Pupils are equal, round, and reactive to light.  Neck: Normal range of motion.  Cardiovascular:  IRR IRR  Respiratory: Effort normal.  Neurological:  Confused. Alert to name. Can follow some simple commands. Left inattention and left hemiparesis. Minimal reaction to pain left arm or leg.   Skin: Skin is warm.    LabResultsLast24Hours       Results for orders placed or performed during the hospital encounter of 11/16/17 (from the  past 24 hour(s))  Glucose, capillary     Status: Abnormal   Collection Time: 11/16/17  6:25 PM  Result Value Ref Range   Glucose-Capillary 173 (H) 70 - 99 mg/dL  MRSA PCR Screening     Status: None   Collection Time: 11/16/17  6:59 PM  Result Value Ref Range   MRSA by PCR NEGATIVE NEGATIVE  Hemoglobin A1c     Status: Abnormal   Collection Time: 11/17/17  5:00 AM  Result Value Ref Range   Hgb A1c MFr Bld 6.1 (H) 4.8 - 5.6 %   Mean Plasma Glucose 128.37 mg/dL  Lipid panel     Status: Abnormal   Collection Time: 11/17/17  5:00 AM  Result Value Ref Range   Cholesterol 151 0 - 200 mg/dL   Triglycerides 61 <150 mg/dL   HDL 37 (L) >40 mg/dL   Total CHOL/HDL Ratio 4.1 RATIO   VLDL 12 0 - 40 mg/dL   LDL Cholesterol 102 (H) 0 - 99 mg/dL  CBC with Differential/Platelet  Status: Abnormal   Collection Time: 11/17/17  5:00 AM  Result Value Ref Range   WBC 15.1 (H) 4.0 - 10.5 K/uL   RBC 4.90 4.22 - 5.81 MIL/uL   Hemoglobin 14.5 13.0 - 17.0 g/dL   HCT 44.7 39.0 - 52.0 %   MCV 91.2 78.0 - 100.0 fL   MCH 29.6 26.0 - 34.0 pg   MCHC 32.4 30.0 - 36.0 g/dL   RDW 13.2 11.5 - 15.5 %   Platelets 196 150 - 400 K/uL   Neutrophils Relative % 84 %   Neutro Abs 12.8 (H) 1.7 - 7.7 K/uL   Lymphocytes Relative 10 %   Lymphs Abs 1.5 0.7 - 4.0 K/uL   Monocytes Relative 5 %   Monocytes Absolute 0.7 0.1 - 1.0 K/uL   Eosinophils Relative 0 %   Eosinophils Absolute 0.0 0.0 - 0.7 K/uL   Basophils Relative 0 %   Basophils Absolute 0.0 0.0 - 0.1 K/uL   Immature Granulocytes 1 %   Abs Immature Granulocytes 0.1 0.0 - 0.1 K/uL  Basic metabolic panel     Status: Abnormal   Collection Time: 11/17/17  5:00 AM  Result Value Ref Range   Sodium 139 135 - 145 mmol/L   Potassium 4.4 3.5 - 5.1 mmol/L   Chloride 109 98 - 111 mmol/L   CO2 22 22 - 32 mmol/L   Glucose, Bld 184 (H) 70 - 99 mg/dL   BUN 24 (H) 8 - 23 mg/dL   Creatinine, Ser 1.26 (H) 0.61 - 1.24 mg/dL    Calcium 8.5 (L) 8.9 - 10.3 mg/dL   GFR calc non Af Amer 50 (L) >60 mL/min   GFR calc Af Amer 58 (L) >60 mL/min   Anion gap 8 5 - 15      ImagingResults(Last48hours)  Ct Angio Head W Or Wo Contrast  Result Date: 11/16/2017 CLINICAL DATA:  84 year old male code stroke. Altered mental status. Evidence of right basal ganglia/MCA lacunar and possible hyperdense right MCA on noncontrast head CT 1309 hours today. EXAM: CT ANGIOGRAPHY HEAD AND NECK CT PERFUSION BRAIN TECHNIQUE: Multidetector CT imaging of the head and neck was performed using the standard protocol during bolus administration of intravenous contrast. Multiplanar CT image reconstructions and MIPs were obtained to evaluate the vascular anatomy. Carotid stenosis measurements (when applicable) are obtained utilizing NASCET criteria, using the distal internal carotid diameter as the denominator. Multiphase CT imaging of the brain was performed following IV bolus contrast injection. Subsequent parametric perfusion maps were calculated using RAPID software. CONTRAST:  75mL ISOVUE-370 IOPAMIDOL (ISOVUE-370) INJECTION 76% COMPARISON:  Head CT without contrast 1309 hours today. FINDINGS: CT Brain Perfusion Findings: ASPECTS approximately 7 on the earlier noncontrast CT. CBF (<30%) Volume: 0 milliliters (CBF less than 38% the techs 5 milliliters along the posterior operculum). Perfusion (Tmax>6.0s) volume: 41 milliliters Mismatch Volume: 41 milliliters Infarction Location:Posterior right MCA territory. Other findings:  Some motion artifact noted CTA NECK Skeleton: Absent dentition. No acute osseous abnormality identified. Upper chest: Left chest cardiac pacemaker type device. Mild dependent pulmonary atelectasis. No superior mediastinal lymphadenopathy. Other neck: Negative.  No neck mass or lymphadenopathy. Aortic arch: 3 vessel arch configuration. Mild to moderate arch atherosclerosis. Right carotid system: No brachiocephalic or right CCA origin  stenosis. Minimal right CCA plaque including at the right carotid bifurcation. Mild soft plaque at the right ICA origin. No cervical right ICA stenosis. Left carotid system: No left CCA origin stenosis. Minimal left CCA plaque including at the left carotid bifurcation.  Mild proximal left ICA plaque with no stenosis. Vertebral arteries: No proximal right subclavian artery stenosis. Normal right vertebral artery origin. Patent right vertebral artery to the skull base without stenosis. No proximal left subclavian artery stenosis despite mild plaque. Soft and calcified plaque at the left vertebral artery origin and proximal V1 segment (series 10, image 29), with only mild stenosis. Codominant vertebral arteries, the left is patent to the skull base with mild V3 segment plaque but no stenosis. CTA HEAD Posterior circulation: Codominant distal vertebral arteries are patent without stenosis. Patent PICA origins and vertebrobasilar junction. Patent basilar artery without stenosis. Normal SCA and left PCA origin. Tortuous right P1 segment and right posterior communicating artery. The left Posterior communicating artery is diminutive or absent. Bilateral PCA branches are within normal limits. Anterior circulation: Both ICA siphons are patent. There is only minor siphon calcified plaque, greater on the left. Normal ophthalmic and right posterior communicating artery origins. Patent carotid termini. Normal MCA and ACA origins. Mildly tortuous A1 segments. Anterior communicating artery and bilateral ACA branches are within normal limits. Left MCA M1 segment is tortuous. Left MCA bifurcation is patent without stenosis. Left MCA branches are within normal limits. Right MCA M1 segment is tortuous but patent without stenosis. Right MCA bifurcation is patent. No right MCA M2 branch occlusion identified. Areas of moderate to severe right M3 branch irregularity are noted on series 14, image 15. Venous sinuses: Patent. Anatomic  variants: None. Review of the MIP images confirms the above findings IMPRESSION: 1. Negative for large vessel occlusion. No discrete right MCA branch occlusion is identified, but moderate to severe right M3 branch irregularity is noted. 2. CT perfusion suggests posterior right MCA territory penumbra, but does not detect core infarct with the standard parameters. However, a small 5 mL infarct is suggested with more sensitive CBF (<38%) parameter, located along the posterior operculum. Therefore, it is possible that the right lentiform finding is chronic. 3. Mild for age atherosclerosis in the head and neck with no hemodynamically significant arterial stenosis identified outside of the right M3 findings in #1. 4.  Study discussed by telephone on 11/16/2017 at 15:19 to Dr. Rory Percy. Electronically Signed   By: Genevie Ann M.D.   On: 11/16/2017 15:20   Ct Head Wo Contrast  Result Date: 11/16/2017 CLINICAL DATA:  Altered mental status. EXAM: CT HEAD WITHOUT CONTRAST TECHNIQUE: Contiguous axial images were obtained from the base of the skull through the vertex without intravenous contrast. COMPARISON:  None. FINDINGS: Brain: Hypodensity within the right basal ganglia, external capsule, and posterior operculum. No evidence of hemorrhage, hydrocephalus, extra-axial collection or mass lesion/mass effect. Moderate age related cerebral atrophy. Scattered moderate periventricular and subcortical white matter hypodensities are nonspecific, but favored to reflect chronic microvascular ischemic changes. Vascular: Right distal MCA branch hyperdensity near the operculum likely reflecting thrombus (series 5, image 46). Atherosclerotic vascular calcification of the carotid siphons. Skull: Negative for fracture or focal lesion. Sinuses/Orbits: No acute finding. Other: None. IMPRESSION: 1. Acute to subacute infarct in the right basal ganglia, external capsule, and posterior operculum. Right distal MCA branch hyperdensity near the  operculum likely reflects thrombus. These results were called by telephone at the time of interpretation on 11/16/2017 at 1:47 pm to Dr. Carmin Muskrat , who verbally acknowledged these results. Electronically Signed   By: Titus Dubin M.D.   On: 11/16/2017 13:49   Ct Angio Neck W Or Wo Contrast  Result Date: 11/16/2017 CLINICAL DATA:  84 year old male code stroke. Altered mental status.  Evidence of right basal ganglia/MCA lacunar and possible hyperdense right MCA on noncontrast head CT 1309 hours today. EXAM: CT ANGIOGRAPHY HEAD AND NECK CT PERFUSION BRAIN TECHNIQUE: Multidetector CT imaging of the head and neck was performed using the standard protocol during bolus administration of intravenous contrast. Multiplanar CT image reconstructions and MIPs were obtained to evaluate the vascular anatomy. Carotid stenosis measurements (when applicable) are obtained utilizing NASCET criteria, using the distal internal carotid diameter as the denominator. Multiphase CT imaging of the brain was performed following IV bolus contrast injection. Subsequent parametric perfusion maps were calculated using RAPID software. CONTRAST:  85mL ISOVUE-370 IOPAMIDOL (ISOVUE-370) INJECTION 76% COMPARISON:  Head CT without contrast 1309 hours today. FINDINGS: CT Brain Perfusion Findings: ASPECTS approximately 7 on the earlier noncontrast CT. CBF (<30%) Volume: 0 milliliters (CBF less than 38% the techs 5 milliliters along the posterior operculum). Perfusion (Tmax>6.0s) volume: 41 milliliters Mismatch Volume: 41 milliliters Infarction Location:Posterior right MCA territory. Other findings:  Some motion artifact noted CTA NECK Skeleton: Absent dentition. No acute osseous abnormality identified. Upper chest: Left chest cardiac pacemaker type device. Mild dependent pulmonary atelectasis. No superior mediastinal lymphadenopathy. Other neck: Negative.  No neck mass or lymphadenopathy. Aortic arch: 3 vessel arch configuration. Mild to  moderate arch atherosclerosis. Right carotid system: No brachiocephalic or right CCA origin stenosis. Minimal right CCA plaque including at the right carotid bifurcation. Mild soft plaque at the right ICA origin. No cervical right ICA stenosis. Left carotid system: No left CCA origin stenosis. Minimal left CCA plaque including at the left carotid bifurcation. Mild proximal left ICA plaque with no stenosis. Vertebral arteries: No proximal right subclavian artery stenosis. Normal right vertebral artery origin. Patent right vertebral artery to the skull base without stenosis. No proximal left subclavian artery stenosis despite mild plaque. Soft and calcified plaque at the left vertebral artery origin and proximal V1 segment (series 10, image 29), with only mild stenosis. Codominant vertebral arteries, the left is patent to the skull base with mild V3 segment plaque but no stenosis. CTA HEAD Posterior circulation: Codominant distal vertebral arteries are patent without stenosis. Patent PICA origins and vertebrobasilar junction. Patent basilar artery without stenosis. Normal SCA and left PCA origin. Tortuous right P1 segment and right posterior communicating artery. The left Posterior communicating artery is diminutive or absent. Bilateral PCA branches are within normal limits. Anterior circulation: Both ICA siphons are patent. There is only minor siphon calcified plaque, greater on the left. Normal ophthalmic and right posterior communicating artery origins. Patent carotid termini. Normal MCA and ACA origins. Mildly tortuous A1 segments. Anterior communicating artery and bilateral ACA branches are within normal limits. Left MCA M1 segment is tortuous. Left MCA bifurcation is patent without stenosis. Left MCA branches are within normal limits. Right MCA M1 segment is tortuous but patent without stenosis. Right MCA bifurcation is patent. No right MCA M2 branch occlusion identified. Areas of moderate to severe right M3  branch irregularity are noted on series 14, image 15. Venous sinuses: Patent. Anatomic variants: None. Review of the MIP images confirms the above findings IMPRESSION: 1. Negative for large vessel occlusion. No discrete right MCA branch occlusion is identified, but moderate to severe right M3 branch irregularity is noted. 2. CT perfusion suggests posterior right MCA territory penumbra, but does not detect core infarct with the standard parameters. However, a small 5 mL infarct is suggested with more sensitive CBF (<38%) parameter, located along the posterior operculum. Therefore, it is possible that the right lentiform finding is chronic.  3. Mild for age atherosclerosis in the head and neck with no hemodynamically significant arterial stenosis identified outside of the right M3 findings in #1. 4.  Study discussed by telephone on 11/16/2017 at 15:19 to Dr. Rory Percy. Electronically Signed   By: Genevie Ann M.D.   On: 11/16/2017 15:20   Ct Cerebral Perfusion W Contrast  Result Date: 11/16/2017 CLINICAL DATA:  84 year old male code stroke. Altered mental status. Evidence of right basal ganglia/MCA lacunar and possible hyperdense right MCA on noncontrast head CT 1309 hours today. EXAM: CT ANGIOGRAPHY HEAD AND NECK CT PERFUSION BRAIN TECHNIQUE: Multidetector CT imaging of the head and neck was performed using the standard protocol during bolus administration of intravenous contrast. Multiplanar CT image reconstructions and MIPs were obtained to evaluate the vascular anatomy. Carotid stenosis measurements (when applicable) are obtained utilizing NASCET criteria, using the distal internal carotid diameter as the denominator. Multiphase CT imaging of the brain was performed following IV bolus contrast injection. Subsequent parametric perfusion maps were calculated using RAPID software. CONTRAST:  36mL ISOVUE-370 IOPAMIDOL (ISOVUE-370) INJECTION 76% COMPARISON:  Head CT without contrast 1309 hours today. FINDINGS: CT Brain  Perfusion Findings: ASPECTS approximately 7 on the earlier noncontrast CT. CBF (<30%) Volume: 0 milliliters (CBF less than 38% the techs 5 milliliters along the posterior operculum). Perfusion (Tmax>6.0s) volume: 41 milliliters Mismatch Volume: 41 milliliters Infarction Location:Posterior right MCA territory. Other findings:  Some motion artifact noted CTA NECK Skeleton: Absent dentition. No acute osseous abnormality identified. Upper chest: Left chest cardiac pacemaker type device. Mild dependent pulmonary atelectasis. No superior mediastinal lymphadenopathy. Other neck: Negative.  No neck mass or lymphadenopathy. Aortic arch: 3 vessel arch configuration. Mild to moderate arch atherosclerosis. Right carotid system: No brachiocephalic or right CCA origin stenosis. Minimal right CCA plaque including at the right carotid bifurcation. Mild soft plaque at the right ICA origin. No cervical right ICA stenosis. Left carotid system: No left CCA origin stenosis. Minimal left CCA plaque including at the left carotid bifurcation. Mild proximal left ICA plaque with no stenosis. Vertebral arteries: No proximal right subclavian artery stenosis. Normal right vertebral artery origin. Patent right vertebral artery to the skull base without stenosis. No proximal left subclavian artery stenosis despite mild plaque. Soft and calcified plaque at the left vertebral artery origin and proximal V1 segment (series 10, image 29), with only mild stenosis. Codominant vertebral arteries, the left is patent to the skull base with mild V3 segment plaque but no stenosis. CTA HEAD Posterior circulation: Codominant distal vertebral arteries are patent without stenosis. Patent PICA origins and vertebrobasilar junction. Patent basilar artery without stenosis. Normal SCA and left PCA origin. Tortuous right P1 segment and right posterior communicating artery. The left Posterior communicating artery is diminutive or absent. Bilateral PCA branches are  within normal limits. Anterior circulation: Both ICA siphons are patent. There is only minor siphon calcified plaque, greater on the left. Normal ophthalmic and right posterior communicating artery origins. Patent carotid termini. Normal MCA and ACA origins. Mildly tortuous A1 segments. Anterior communicating artery and bilateral ACA branches are within normal limits. Left MCA M1 segment is tortuous. Left MCA bifurcation is patent without stenosis. Left MCA branches are within normal limits. Right MCA M1 segment is tortuous but patent without stenosis. Right MCA bifurcation is patent. No right MCA M2 branch occlusion identified. Areas of moderate to severe right M3 branch irregularity are noted on series 14, image 15. Venous sinuses: Patent. Anatomic variants: None. Review of the MIP images confirms the above findings IMPRESSION:  1. Negative for large vessel occlusion. No discrete right MCA branch occlusion is identified, but moderate to severe right M3 branch irregularity is noted. 2. CT perfusion suggests posterior right MCA territory penumbra, but does not detect core infarct with the standard parameters. However, a small 5 mL infarct is suggested with more sensitive CBF (<38%) parameter, located along the posterior operculum. Therefore, it is possible that the right lentiform finding is chronic. 3. Mild for age atherosclerosis in the head and neck with no hemodynamically significant arterial stenosis identified outside of the right M3 findings in #1. 4.  Study discussed by telephone on 11/16/2017 at 15:19 to Dr. Rory Percy. Electronically Signed   By: Genevie Ann M.D.   On: 11/16/2017 15:20   Dg Chest Port 1 View  Result Date: 11/16/2017 CLINICAL DATA:  Encounter for AMS and SOB EXAM: PORTABLE CHEST - 1 VIEW COMPARISON:  01/06/2016 FINDINGS: Relatively low lung volumes with crowding of bronchovascular structures. No confluent airspace infiltrate or overt edema. Heart size upper limits normal for technique. No  effusion or pneumothorax. Stable left subclavian single lead transvenous pacemaker extending towards the right ventricular apex. Lumbar fixation hardware partially visualized. IMPRESSION: No acute cardiopulmonary disease. Electronically Signed   By: Lucrezia Europe M.D.   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 Does the need for close, 24 hr/day medical supervision in concert with the patient's rehab needs make it unreasonable for this patient to be served in a less intensive setting? Yes 2. Co-Morbidities requiring supervision/potential complications: afib, HTN, 3. Due to bladder management, bowel management, safety, skin/wound care, disease management, medication administration, pain management and patient education, does the patient require 24 hr/day rehab nursing? Yes 4. Does the patient require coordinated care of a physician, rehab nurse, PT (1-2 hrs/day, 5 days/week), OT (1-2 hrs/day, 5 days/week) and SLP (1-2 hrs/day, 5 days/week) to address physical and functional deficits in the context of the above medical diagnosis(es)? Yes Addressing deficits in the following areas: balance, endurance, locomotion, strength, transferring, bowel/bladder control, bathing, dressing, feeding, grooming, toileting, cognition, swallowing and psychosocial support 5. Can the patient actively participate in an intensive therapy program of at least 3 hrs of therapy per day at least 5 days per week? Yes 6. The potential for patient to make measurable gains while on inpatient rehab is good 7. Anticipated functional outcomes upon discharge from inpatient rehab are supervision and min assist  with PT, supervision and min assist with OT, supervision with SLP. 8. Estimated rehab length of stay to reach the above functional goals is: 19-24 days 9. Anticipated D/C setting: Home 10. Anticipated post D/C treatments: [place] therapy 11. Overall  Rehab/Functional Prognosis: good  RECOMMENDATIONS: This patient's condition is appropriate for continued rehabilitative care in the following setting: CIR Patient has agreed to participate in recommended program. N/A Note that insurance prior authorization may be required for reimbursement for recommended care.  Comment: Rehab Admissions Coordinator to follow up.  Thanks,  Meredith Staggers, MD, Mellody Drown  I have personally performed a face to face diagnostic evaluation of this patient. Additionally, I have reviewed and concur with the physician assistant's documentation above.    Bary Leriche, PA-C 11/17/2017       Physical Medicine and Rehabilitation Consult  Reason for Consult: Functional deficits due to stroke.  Referring Physician: Dr. Erlinda Hong   HPI: Dakota [name] is a 84 y.o. male with history of HTN, CHB s/p PPM,  GERD/Schatzki's ring, PAF- refused coumadin; who was admitted on 11/16/17 with left sided weakness, right gaze preference,  slurred speech and mental status changes.  CT head showed acute to subacute right basal ganglia infarct with concerns of distal R-MCA thrombus. CTA/P head done revealing R-MCA territory penumbra with moderate to severe  M3 branch irregularity. He underwent cerebral angio with revascularization of R-MCA M2 segment by Dr.Deveshwar.  Work up underway and Dr. Erlinda Hong felt that stroke embolic due to A fib. Therapy evaluations completed today and patient with significant limitations due to delirium, severe dysarthria, right gaze preference with left sided weakness. CIR recommended due to functional deficits.   Review of Systems  Unable to perform ROS: Mental acuity          Past Medical History:  Diagnosis Date  . Anxiety   . Back pain, chronic   . Complete heart block ([place]) 07/18/2014   Medtronic [place] model [id number] (serial number [id number])  singe lead PPM  . GERD (gastroesophageal reflux disease) 10/2002  . Hyperlipidemia 12/1994   . Hypertension   . Hypokalemia   . Lung collapse 03/1991   Fall  . Permanent atrial fibrillation ([name])    Refused coumadin therapy         Past Surgical History:  Procedure Laterality Date  . BACK SURGERY  11/91   with hardware fixation  . CARDIAC CATHETERIZATION  06/2004   30 % stenosis, EF normal  . ESOPHAGOGASTRODUODENOSCOPY N/A 01/06/2016   Procedure: ESOPHAGOGASTRODUODENOSCOPY (EGD);  Surgeon: Otis Brace, MD;  Location: [facility];  Service: Gastroenterology;  Laterality: N/A;  . EYE SURGERY     Retinal bubble surgery, cataract   . LEFT HEART CATHETERIZATION WITH CORONARY ANGIOGRAM N/A 07/18/2014   Procedure: LEFT HEART CATHETERIZATION WITH CORONARY ANGIOGRAM;  Surgeon: Jettie Booze, MD; [facility] [place], LAD mild dz, D1 80%, D2 OK, CFX system OK, RCA OK, PDA 100%, med rx  . PERMANENT PACEMAKER INSERTION N/A 07/18/2014   Procedure: PERMANENT PACEMAKER INSERTION;  Surgeon: Thompson Grayer, MD; Medtronic [place] model [phone] (serial number [id number])    . RADIOLOGY WITH ANESTHESIA N/A 11/16/2017   Procedure: RADIOLOGY WITH ANESTHESIA;  Surgeon: Luanne Bras, MD;  Location: [facility];  Service: Radiology;  Laterality: N/A;  . TEMPORARY PACEMAKER INSERTION N/A 07/18/2014   Procedure: TEMPORARY PACEMAKER INSERTION;  Surgeon: Peter M Martinique, MD;  Location: [facility] C[facility] LAB;  Service: Cardiovascular;  Laterality: N/A;         Family History  Problem Relation Age of Onset  . Pneumonia Mother   . Hip fracture Mother   . Cancer Father        splenic  . Heart disease Sister        Pacer placed  . Cancer Brother        throat and lung  . Cancer Brother        prostate, age 65  . Prostate cancer Brother   . Heart disease Son   . Stroke Neg Hx   . Colon cancer Neg Hx     Social History:  Married and is the caregiver for his wife who is bedbound?. Per reports that he quit smoking about 64 years ago. He has a 15.00 pack-year smoking history. His  smokeless tobacco use includes chew. Per reports that he does not drink alcohol or use drugs.          Allergies  Allergen Reactions  . Warfarin And Related Other (See Comments)    Lost a lot of weight and  also experienced numbness  . Atorvastatin Other (See Comments)    REACTION: MUSCLE PAIN  . Ezetimibe Other (See Comments)    REACTION: MUSCLE PAIN  . Diazepam Other (See Comments)    UNKNOWN, per pt  . Penicillins Other (See Comments)    UNKNOWN, per pt         Medications Prior to Admission  Medication Sig Dispense Refill  . ALPRAZolam (XANAX) 0.5 MG tablet TAKE ONE-HALF TABLET BY MOUTH TWICE DAILY AS NEEDED FOR ANXIETY (Patient taking differently: Take 0.25 mg by mouth 2 (two) times daily as needed for anxiety. TAKE ONE-HALF TABLET BY MOUTH TWICE DAILY AS NEEDED FOR ANXIETY) 60 tablet 0  . aspirin 81 MG tablet Take 81 mg by mouth 2 (two) times daily.     . calcium carbonate (TUMS - DOSED IN MG ELEMENTAL CALCIUM) 500 MG chewable tablet Chew 1 tablet by mouth daily as needed for indigestion.     . carvedilol (COREG) 25 MG tablet TAKE 1 TABLET BY MOUTH TWICE DAILY WITH  A  MEAL. 180 tablet 1  . docusate sodium (COLACE) 100 MG capsule Take 1 capsule (100 mg total) by mouth daily as needed for mild constipation.    . hydrochlorothiazide (MICROZIDE) 12.5 MG capsule TAKE ONE CAPSULE BY MOUTH ONCE DAILY 90 capsule 1  . Magnesium Hydroxide (MILK OF MAGNESIA PO) Take 1 tablet by mouth daily as needed.    . nitroGLYCERIN (NITROSTAT) 0.4 MG SL tablet Place 1 tablet (0.4 mg total) under the tongue every 5 (five) minutes as needed for chest pain. 25 tablet 3  . pantoprazole (PROTONIX) 40 MG tablet Take 1 tablet (40 mg total) by mouth daily. 30 tablet 0  . ranitidine (ZANTAC) 150 MG tablet TAKE 1 TABLET BY MOUTH ONCE DAILY 90 tablet 1    Home: Home Living Family/patient expects to be discharged to:: Private residence Living Arrangements: Spouse/significant  other Available Help at Discharge: Family, Available 24 hours/day Type of Home: House Home Access: Ramped entrance Home Layout: One level Bathroom Shower/Tub: Gaffer, Chiropodist: Standard Home Equipment: Environmental consultant - 2 wheels, Transport chair, Bedside commode Additional Comments: pt was primary caregiver for wife who does not walk, pt would transfer her and family assists with bathing  Functional History: Prior Function Level of Independence: Independent Comments: very independent, 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 Pt has 4 kids who can assist Functional Status:  Mobility: Bed Mobility Overal bed mobility: Needs Assistance Bed Mobility: Rolling, Sidelying to Sit Rolling: Max assist, +2 for safety/equipment Sidelying to sit: Max assist, +2 for physical assistance, HOB elevated General bed mobility comments: max assist to bend left knee, move LUE onto chest and roll to right, pt assisting with RUE. Assist to elevate trunk and bring legs off of bed Transfers Overall transfer level: Needs assistance Transfers: Sit to/from Stand, Stand Pivot Transfers Sit to Stand: Mod assist, +2 physical assistance Stand pivot transfers: Max assist, +2 physical assistance General transfer comment: pt stood from bed and chair with left knee blocked and pt activating bil quad and rising with cues to initiate and assist at belt and trunk for stability. Pt able to pivot to right from bed<>chair with assist for balance, stepping and sequence. Pt with blocking left knee and facilitation to step and extend trunk.  Ambulation/Gait General Gait Details: unable  ADL: ADL Overall ADL's : Needs assistance/impaired Eating/Feeding: NPO Eating/Feeding Details (indicate cue type and reason): dentures removed. pt with only bottom plate and son uncertain  where the top plate is located Upper Body Bathing: Maximal assistance Lower Body Bathing: Total assistance Upper Body Dressing :  Maximal assistance Lower Body Dressing: Total assistance Toilet Transfer: +2 for physical assistance, Maximal assistance Toilet Transfer Details (indicate cue type and reason): requires (A) to shift weight toward chair and bd General ADL Comments: simulated OOB to chair. Pt positioned in chair and RN arriving to place patient back in bed for MRI. pt transfered back to bed surface. Encouragement for staff to get patient OOB for all meal  Cognition: Cognition Overall Cognitive Status: Impaired/Different from baseline Arousal/Alertness: Awake/alert Orientation Level: Oriented to person, Disoriented to situation, Disoriented to time, Disoriented to place Attention: Focused, Sustained Focused Attention: Appears intact Sustained Attention: Impaired Memory: Impaired Memory Impairment: Decreased short term memory Decreased Short Term Memory: Verbal basic Awareness: Impaired Awareness Impairment: Intellectual impairment Problem Solving: Impaired Problem Solving Impairment: Verbal basic, Functional basic Behaviors: Restless Safety/Judgment: Impaired Cognition Arousal/Alertness: Awake/alert Behavior During Therapy: Restless Overall Cognitive Status: Impaired/Different from baseline Area of Impairment: Orientation, Attention, Memory, Following commands, Safety/judgement, Awareness, Problem solving Orientation Level: Disoriented to, Time, Place Current Attention Level: Focused Memory: Decreased short-term memory Following Commands: Follows one step commands inconsistently Safety/Judgement: Decreased awareness of safety, Decreased awareness of deficits Problem Solving: Slow processing, Decreased initiation, Difficulty sequencing, Requires verbal cues, Requires tactile cues General Comments: pt stating he is in [place], hospital with spot on his brain. Pt stating year as 2010 but able to state correct age, states month as October, right gaze preference  Blood pressure 123/66, pulse 78,  temperature 99.2 F (37.3 C), resp. rate 20, height 5\' 8"  (1.727 m), weight 87.5 kg (192 lb 14.4 oz), SpO2 100 %. Physical Exam  Constitutional: No distress.  HENT:  Head: Normocephalic.  Eyes: Pupils are equal, round, and reactive to light.  Neck: Normal range of motion.  Cardiovascular:  IRR IRR  Respiratory: Effort normal.  Neurological:  Confused. Alert to name. Can follow some simple commands. Left inattention and left hemiparesis. Minimal reaction to pain left arm or leg.   Skin: Skin is warm.    LabResultsLast24Hours       Results for orders placed or performed during the hospital encounter of 11/16/17 (from the past 24 hour(s))  Glucose, capillary     Status: Abnormal   Collection Time: 11/16/17  6:25 PM  Result Value Ref Range   Glucose-Capillary 173 (H) 70 - 99 mg/dL  MRSA PCR Screening     Status: None   Collection Time: 11/16/17  6:59 PM  Result Value Ref Range   MRSA by PCR NEGATIVE NEGATIVE  Hemoglobin A1c     Status: Abnormal   Collection Time: 11/17/17  5:00 AM  Result Value Ref Range   Hgb A1c MFr Bld 6.1 (H) 4.8 - 5.6 %   Mean Plasma Glucose 128.37 mg/dL  Lipid panel     Status: Abnormal   Collection Time: 11/17/17  5:00 AM  Result Value Ref Range   Cholesterol 151 0 - 200 mg/dL   Triglycerides 61 <150 mg/dL   HDL 37 (L) >40 mg/dL   Total CHOL/HDL Ratio 4.1 RATIO   VLDL 12 0 - 40 mg/dL   LDL Cholesterol 102 (H) 0 - 99 mg/dL  CBC with Differential/Platelet     Status: Abnormal   Collection Time: 11/17/17  5:00 AM  Result Value Ref Range   WBC 15.1 (H) 4.0 - 10.5 K/uL   RBC 4.90 4.22 - 5.81 MIL/uL   Hemoglobin 14.5 13.0 -  17.0 g/dL   HCT 44.7 39.0 - 52.0 %   MCV 91.2 78.0 - 100.0 fL   MCH 29.6 26.0 - 34.0 pg   MCHC 32.4 30.0 - 36.0 g/dL   RDW 13.2 11.5 - 15.5 %   Platelets 196 150 - 400 K/uL   Neutrophils Relative % 84 %   Neutro Abs 12.8 (H) 1.7 - 7.7 K/uL   Lymphocytes Relative 10 %   Lymphs Abs 1.5 0.7 - 4.0  K/uL   Monocytes Relative 5 %   Monocytes Absolute 0.7 0.1 - 1.0 K/uL   Eosinophils Relative 0 %   Eosinophils Absolute 0.0 0.0 - 0.7 K/uL   Basophils Relative 0 %   Basophils Absolute 0.0 0.0 - 0.1 K/uL   Immature Granulocytes 1 %   Abs Immature Granulocytes 0.1 0.0 - 0.1 K/uL  Basic metabolic panel     Status: Abnormal   Collection Time: 11/17/17  5:00 AM  Result Value Ref Range   Sodium 139 135 - 145 mmol/L   Potassium 4.4 3.5 - 5.1 mmol/L   Chloride 109 98 - 111 mmol/L   CO2 22 22 - 32 mmol/L   Glucose, Bld 184 (H) 70 - 99 mg/dL   BUN 24 (H) 8 - 23 mg/dL   Creatinine, Ser 1.26 (H) 0.61 - 1.24 mg/dL   Calcium 8.5 (L) 8.9 - 10.3 mg/dL   GFR calc non Af Amer 50 (L) >60 mL/min   GFR calc Af Amer 58 (L) >60 mL/min   Anion gap 8 5 - 15      ImagingResults(Last48hours)  Ct Angio Head W Or Wo Contrast  Result Date: 11/16/2017 CLINICAL DATA:  84 year old male code stroke. Altered mental status. Evidence of right basal ganglia/MCA lacunar and possible hyperdense right MCA on noncontrast head CT 1309 hours today. EXAM: CT ANGIOGRAPHY HEAD AND NECK CT PERFUSION BRAIN TECHNIQUE: Multidetector CT imaging of the head and neck was performed using the standard protocol during bolus administration of intravenous contrast. Multiplanar CT image reconstructions and MIPs were obtained to evaluate the vascular anatomy. Carotid stenosis measurements (when applicable) are obtained utilizing NASCET criteria, using the distal internal carotid diameter as the denominator. Multiphase CT imaging of the brain was performed following IV bolus contrast injection. Subsequent parametric perfusion maps were calculated using RAPID software. CONTRAST:  15mL ISOVUE-370 IOPAMIDOL (ISOVUE-370) INJECTION 76% COMPARISON:  Head CT without contrast 1309 hours today. FINDINGS: CT Brain Perfusion Findings: ASPECTS approximately 7 on the earlier noncontrast CT. CBF (<30%) Volume: 0 milliliters (CBF less  than 38% the techs 5 milliliters along the posterior operculum). Perfusion (Tmax>6.0s) volume: 41 milliliters Mismatch Volume: 41 milliliters Infarction Location:Posterior right MCA territory. Other findings:  Some motion artifact noted CTA NECK Skeleton: Absent dentition. No acute osseous abnormality identified. Upper chest: Left chest cardiac pacemaker type device. Mild dependent pulmonary atelectasis. No superior mediastinal lymphadenopathy. Other neck: Negative.  No neck mass or lymphadenopathy. Aortic arch: 3 vessel arch configuration. Mild to moderate arch atherosclerosis. Right carotid system: No brachiocephalic or right CCA origin stenosis. Minimal right CCA plaque including at the right carotid bifurcation. Mild soft plaque at the right ICA origin. No cervical right ICA stenosis. Left carotid system: No left CCA origin stenosis. Minimal left CCA plaque including at the left carotid bifurcation. Mild proximal left ICA plaque with no stenosis. Vertebral arteries: No proximal right subclavian artery stenosis. Normal right vertebral artery origin. Patent right vertebral artery to the skull base without stenosis. No proximal left subclavian artery stenosis despite  mild plaque. Soft and calcified plaque at the left vertebral artery origin and proximal V1 segment (series 10, image 29), with only mild stenosis. Codominant vertebral arteries, the left is patent to the skull base with mild V3 segment plaque but no stenosis. CTA HEAD Posterior circulation: Codominant distal vertebral arteries are patent without stenosis. Patent PICA origins and vertebrobasilar junction. Patent basilar artery without stenosis. Normal SCA and left PCA origin. Tortuous right P1 segment and right posterior communicating artery. The left Posterior communicating artery is diminutive or absent. Bilateral PCA branches are within normal limits. Anterior circulation: Both ICA siphons are patent. There is only minor siphon calcified plaque,  greater on the left. Normal ophthalmic and right posterior communicating artery origins. Patent carotid termini. Normal MCA and ACA origins. Mildly tortuous A1 segments. Anterior communicating artery and bilateral ACA branches are within normal limits. Left MCA M1 segment is tortuous. Left MCA bifurcation is patent without stenosis. Left MCA branches are within normal limits. Right MCA M1 segment is tortuous but patent without stenosis. Right MCA bifurcation is patent. No right MCA M2 branch occlusion identified. Areas of moderate to severe right M3 branch irregularity are noted on series 14, image 15. Venous sinuses: Patent. Anatomic variants: None. Review of the MIP images confirms the above findings IMPRESSION: 1. Negative for large vessel occlusion. No discrete right MCA branch occlusion is identified, but moderate to severe right M3 branch irregularity is noted. 2. CT perfusion suggests posterior right MCA territory penumbra, but does not detect core infarct with the standard parameters. However, a small 5 mL infarct is suggested with more sensitive CBF (<38%) parameter, located along the posterior operculum. Therefore, it is possible that the right lentiform finding is chronic. 3. Mild for age atherosclerosis in the head and neck with no hemodynamically significant arterial stenosis identified outside of the right M3 findings in #1. 4.  Study discussed by telephone on 11/16/2017 at 15:19 to Dr. Rory Percy. Electronically Signed   By: Genevie Ann M.D.   On: 11/16/2017 15:20   Ct Head Wo Contrast  Result Date: 11/16/2017 CLINICAL DATA:  Altered mental status. EXAM: CT HEAD WITHOUT CONTRAST TECHNIQUE: Contiguous axial images were obtained from the base of the skull through the vertex without intravenous contrast. COMPARISON:  None. FINDINGS: Brain: Hypodensity within the right basal ganglia, external capsule, and posterior operculum. No evidence of hemorrhage, hydrocephalus, extra-axial collection or mass lesion/mass  effect. Moderate age related cerebral atrophy. Scattered moderate periventricular and subcortical white matter hypodensities are nonspecific, but favored to reflect chronic microvascular ischemic changes. Vascular: Right distal MCA branch hyperdensity near the operculum likely reflecting thrombus (series 5, image 46). Atherosclerotic vascular calcification of the carotid siphons. Skull: Negative for fracture or focal lesion. Sinuses/Orbits: No acute finding. Other: None. IMPRESSION: 1. Acute to subacute infarct in the right basal ganglia, external capsule, and posterior operculum. Right distal MCA branch hyperdensity near the operculum likely reflects thrombus. These results were called by telephone at the time of interpretation on 11/16/2017 at 1:47 pm to Dr. Carmin Muskrat , who verbally acknowledged these results. Electronically Signed   By: Titus Dubin M.D.   On: 11/16/2017 13:49   Ct Angio Neck W Or Wo Contrast  Result Date: 11/16/2017 CLINICAL DATA:  84 year old male code stroke. Altered mental status. Evidence of right basal ganglia/MCA lacunar and possible hyperdense right MCA on noncontrast head CT 1309 hours today. EXAM: CT ANGIOGRAPHY HEAD AND NECK CT PERFUSION BRAIN TECHNIQUE: Multidetector CT imaging of the head and neck was performed  using the standard protocol during bolus administration of intravenous contrast. Multiplanar CT image reconstructions and MIPs were obtained to evaluate the vascular anatomy. Carotid stenosis measurements (when applicable) are obtained utilizing NASCET criteria, using the distal internal carotid diameter as the denominator. Multiphase CT imaging of the brain was performed following IV bolus contrast injection. Subsequent parametric perfusion maps were calculated using RAPID software. CONTRAST:  41mL ISOVUE-370 IOPAMIDOL (ISOVUE-370) INJECTION 76% COMPARISON:  Head CT without contrast 1309 hours today. FINDINGS: CT Brain Perfusion Findings: ASPECTS approximately 7  on the earlier noncontrast CT. CBF (<30%) Volume: 0 milliliters (CBF less than 38% the techs 5 milliliters along the posterior operculum). Perfusion (Tmax>6.0s) volume: 41 milliliters Mismatch Volume: 41 milliliters Infarction Location:Posterior right MCA territory. Other findings:  Some motion artifact noted CTA NECK Skeleton: Absent dentition. No acute osseous abnormality identified. Upper chest: Left chest cardiac pacemaker type device. Mild dependent pulmonary atelectasis. No superior mediastinal lymphadenopathy. Other neck: Negative.  No neck mass or lymphadenopathy. Aortic arch: 3 vessel arch configuration. Mild to moderate arch atherosclerosis. Right carotid system: No brachiocephalic or right CCA origin stenosis. Minimal right CCA plaque including at the right carotid bifurcation. Mild soft plaque at the right ICA origin. No cervical right ICA stenosis. Left carotid system: No left CCA origin stenosis. Minimal left CCA plaque including at the left carotid bifurcation. Mild proximal left ICA plaque with no stenosis. Vertebral arteries: No proximal right subclavian artery stenosis. Normal right vertebral artery origin. Patent right vertebral artery to the skull base without stenosis. No proximal left subclavian artery stenosis despite mild plaque. Soft and calcified plaque at the left vertebral artery origin and proximal V1 segment (series 10, image 29), with only mild stenosis. Codominant vertebral arteries, the left is patent to the skull base with mild V3 segment plaque but no stenosis. CTA HEAD Posterior circulation: Codominant distal vertebral arteries are patent without stenosis. Patent PICA origins and vertebrobasilar junction. Patent basilar artery without stenosis. Normal SCA and left PCA origin. Tortuous right P1 segment and right posterior communicating artery. The left Posterior communicating artery is diminutive or absent. Bilateral PCA branches are within normal limits. Anterior circulation: Both  ICA siphons are patent. There is only minor siphon calcified plaque, greater on the left. Normal ophthalmic and right posterior communicating artery origins. Patent carotid termini. Normal MCA and ACA origins. Mildly tortuous A1 segments. Anterior communicating artery and bilateral ACA branches are within normal limits. Left MCA M1 segment is tortuous. Left MCA bifurcation is patent without stenosis. Left MCA branches are within normal limits. Right MCA M1 segment is tortuous but patent without stenosis. Right MCA bifurcation is patent. No right MCA M2 branch occlusion identified. Areas of moderate to severe right M3 branch irregularity are noted on series 14, image 15. Venous sinuses: Patent. Anatomic variants: None. Review of the MIP images confirms the above findings IMPRESSION: 1. Negative for large vessel occlusion. No discrete right MCA branch occlusion is identified, but moderate to severe right M3 branch irregularity is noted. 2. CT perfusion suggests posterior right MCA territory penumbra, but does not detect core infarct with the standard parameters. However, a small 5 mL infarct is suggested with more sensitive CBF (<38%) parameter, located along the posterior operculum. Therefore, it is possible that the right lentiform finding is chronic. 3. Mild for age atherosclerosis in the head and neck with no hemodynamically significant arterial stenosis identified outside of the right M3 findings in #1. 4.  Study discussed by telephone on 11/16/2017 at 15:19 to Dr. Rory Percy.  Electronically Signed   By: Genevie Ann M.D.   On: 11/16/2017 15:20   Ct Cerebral Perfusion W Contrast  Result Date: 11/16/2017 CLINICAL DATA:  84 year old male code stroke. Altered mental status. Evidence of right basal ganglia/MCA lacunar and possible hyperdense right MCA on noncontrast head CT 1309 hours today. EXAM: CT ANGIOGRAPHY HEAD AND NECK CT PERFUSION BRAIN TECHNIQUE: Multidetector CT imaging of the head and neck was performed using  the standard protocol during bolus administration of intravenous contrast. Multiplanar CT image reconstructions and MIPs were obtained to evaluate the vascular anatomy. Carotid stenosis measurements (when applicable) are obtained utilizing NASCET criteria, using the distal internal carotid diameter as the denominator. Multiphase CT imaging of the brain was performed following IV bolus contrast injection. Subsequent parametric perfusion maps were calculated using RAPID software. CONTRAST:  52mL ISOVUE-370 IOPAMIDOL (ISOVUE-370) INJECTION 76% COMPARISON:  Head CT without contrast 1309 hours today. FINDINGS: CT Brain Perfusion Findings: ASPECTS approximately 7 on the earlier noncontrast CT. CBF (<30%) Volume: 0 milliliters (CBF less than 38% the techs 5 milliliters along the posterior operculum). Perfusion (Tmax>6.0s) volume: 41 milliliters Mismatch Volume: 41 milliliters Infarction Location:Posterior right MCA territory. Other findings:  Some motion artifact noted CTA NECK Skeleton: Absent dentition. No acute osseous abnormality identified. Upper chest: Left chest cardiac pacemaker type device. Mild dependent pulmonary atelectasis. No superior mediastinal lymphadenopathy. Other neck: Negative.  No neck mass or lymphadenopathy. Aortic arch: 3 vessel arch configuration. Mild to moderate arch atherosclerosis. Right carotid system: No brachiocephalic or right CCA origin stenosis. Minimal right CCA plaque including at the right carotid bifurcation. Mild soft plaque at the right ICA origin. No cervical right ICA stenosis. Left carotid system: No left CCA origin stenosis. Minimal left CCA plaque including at the left carotid bifurcation. Mild proximal left ICA plaque with no stenosis. Vertebral arteries: No proximal right subclavian artery stenosis. Normal right vertebral artery origin. Patent right vertebral artery to the skull base without stenosis. No proximal left subclavian artery stenosis despite mild plaque. Soft and  calcified plaque at the left vertebral artery origin and proximal V1 segment (series 10, image 29), with only mild stenosis. Codominant vertebral arteries, the left is patent to the skull base with mild V3 segment plaque but no stenosis. CTA HEAD Posterior circulation: Codominant distal vertebral arteries are patent without stenosis. Patent PICA origins and vertebrobasilar junction. Patent basilar artery without stenosis. Normal SCA and left PCA origin. Tortuous right P1 segment and right posterior communicating artery. The left Posterior communicating artery is diminutive or absent. Bilateral PCA branches are within normal limits. Anterior circulation: Both ICA siphons are patent. There is only minor siphon calcified plaque, greater on the left. Normal ophthalmic and right posterior communicating artery origins. Patent carotid termini. Normal MCA and ACA origins. Mildly tortuous A1 segments. Anterior communicating artery and bilateral ACA branches are within normal limits. Left MCA M1 segment is tortuous. Left MCA bifurcation is patent without stenosis. Left MCA branches are within normal limits. Right MCA M1 segment is tortuous but patent without stenosis. Right MCA bifurcation is patent. No right MCA M2 branch occlusion identified. Areas of moderate to severe right M3 branch irregularity are noted on series 14, image 15. Venous sinuses: Patent. Anatomic variants: None. Review of the MIP images confirms the above findings IMPRESSION: 1. Negative for large vessel occlusion. No discrete right MCA branch occlusion is identified, but moderate to severe right M3 branch irregularity is noted. 2. CT perfusion suggests posterior right MCA territory penumbra, but does not detect core  infarct with the standard parameters. However, a small 5 mL infarct is suggested with more sensitive CBF (<38%) parameter, located along the posterior operculum. Therefore, it is possible that the right lentiform finding is chronic. 3. Mild  for age atherosclerosis in the head and neck with no hemodynamically significant arterial stenosis identified outside of the right M3 findings in #1. 4.  Study discussed by telephone on 11/16/2017 at 15:19 to Dr. Rory Percy. Electronically Signed   By: Genevie Ann M.D.   On: 11/16/2017 15:20   Dg Chest Port 1 View  Result Date: 11/16/2017 CLINICAL DATA:  Encounter for AMS and SOB EXAM: PORTABLE CHEST - 1 VIEW COMPARISON:  01/06/2016 FINDINGS: Relatively low lung volumes with crowding of bronchovascular structures. No confluent airspace infiltrate or overt edema. Heart size upper limits normal for technique. No effusion or pneumothorax. Stable left subclavian single lead transvenous pacemaker extending towards the right ventricular apex. Lumbar fixation hardware partially visualized. IMPRESSION: No acute cardiopulmonary disease. Electronically Signed   By: Lucrezia Europe M.D.   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 Does the need for close, 24 hr/day medical supervision in concert with the patient's rehab needs make it unreasonable for this patient to be served in a less intensive setting? Yes 13. Co-Morbidities requiring supervision/potential complications: afib, HTN, 14. Due to bladder management, bowel management, safety, skin/wound care, disease management, medication administration, pain management and patient education, does the patient require 24 hr/day rehab nursing? Yes 15. Does the patient require coordinated care of a physician, rehab nurse, PT (1-2 hrs/day, 5 days/week), OT (1-2 hrs/day, 5 days/week) and SLP (1-2 hrs/day, 5 days/week) to address physical and functional deficits in the context of the above medical diagnosis(es)? Yes Addressing deficits in the following areas: balance, endurance, locomotion, strength, transferring, bowel/bladder control, bathing, dressing, feeding, grooming, toileting, cognition,  swallowing and psychosocial support 16. Can the patient actively participate in an intensive therapy program of at least 3 hrs of therapy per day at least 5 days per week? Yes 17. The potential for patient to make measurable gains while on inpatient rehab is good 18. Anticipated functional outcomes upon discharge from inpatient rehab are supervision and min assist  with PT, supervision and min assist with OT, supervision with SLP. 19. Estimated rehab length of stay to reach the above functional goals is: 19-24 days 20. Anticipated D/C setting: Home 21. Anticipated post D/C treatments: HH therapy 22. Overall Rehab/Functional Prognosis: good  RECOMMENDATIONS: This patient's condition is appropriate for continued rehabilitative care in the following setting: CIR Patient has agreed to participate in recommended program. N/A Note that insurance prior authorization may be required for reimbursement for recommended care.  Comment: Rehab Admissions Coordinator to follow up.  Thanks,  Meredith Staggers, MD, Mellody Drown  I have personally performed a face to face diagnostic evaluation of this patient. Additionally, I have reviewed and concur with the physician assistant's documentation above.    Bary Leriche, PA-C 11/17/2017

## 2017-11-21 NOTE — Progress Notes (Signed)
Inpatient Rehabilitation Admissions Coordinator  I met with pt's son, Charan, at bedside. Patient has rested off an on since yesterday. We can admit pt to inpt rehab today and he is in agreement. Family has been staying with pt 24/7 due to his confusion. I have contacted Sharon Biby, GNP as well as RN CM and SW. I will make the arrangements to admit today.   , RN, MSN Rehab Admissions Coordinator [phone] 11/21/2017 1:47 PM  

## 2017-11-21 NOTE — IPOC Note (Signed)
Overall Plan of Care [facility]) Patient Details Name: Dakota [name] MRN: [id number] DOB: 11-23-1928  Admitting Diagnosis: Right MCA stroke  Hospital Problems: Active Problems:   Acute ischemic right MCA stroke ([place])   Leucocytosis   Hypoalbuminemia due to protein-calorie malnutrition ([place])   Prediabetes   Prerenal azotemia     Functional Problem List: Nursing Behavior, Bladder, Bowel, Endurance, Medication Management, Nutrition, Perception, Safety, Skin Integrity  PT Balance, Behavior, Endurance, Motor, Safety, Perception  OT Balance, Cognition, Endurance, Motor, Perception, Safety, Sensory, Vision  SLP Cognition, Nutrition  TR         Basic ADL's: OT Eating, Grooming, Bathing, Dressing, Toileting     Advanced  ADL's: OT       Transfers: PT Bed Mobility, Bed to Chair, Car, Manufacturing systems engineer, Metallurgist: PT Stairs, Emergency planning/management officer, Ambulation     Additional Impairments: OT Fuctional Use of Upper Extremity  SLP Swallowing, Communication, Social Cognition expression Problem Solving, Memory, Attention, Awareness  TR      Anticipated Outcomes Item Anticipated Outcome  Self Feeding set up  Swallowing  Mod assist    Basic self-care  min A  Toileting  mod A   Bathroom Transfers min A to toilet, mod A to tub bench  Bowel/Bladder  Mod assist  Transfers  Supervision basic and car  Locomotion  supervision w/c x 150', gait x 150' with LRAD, ramp and stairs TBD  Communication  Min assist   Cognition  Mod assist   Pain  3 or less  Safety/Judgment  Mod assist   Therapy Plan: PT Intensity: Minimum of 1-2 x/day ,45 to 90 minutes PT Frequency: 5 out of 7 days PT Duration Estimated Length of Stay: 21-24 OT Intensity: Minimum of 1-2 x/day, 45 to 90 minutes OT Frequency: 5 out of 7 days OT Duration/Estimated Length of Stay: 21-24 days SLP Intensity: Minumum of 1-2 x/day, 30 to 90 minutes SLP Frequency: 3 to 5 out of 7 days SLP  Duration/Estimated Length of Stay: 21-28 days     Team Interventions: Nursing Interventions Patient/Family Education, Disease Management/Prevention, Skin Care/Wound Management, Bladder Management, Pain Management, Cognitive Remediation/Compensation, Bowel Management, Medication Management, Dysphagia/Aspiration Precaution Training, Discharge Planning  PT interventions Ambulation/gait training, Community reintegration, DME/adaptive equipment instruction, Neuromuscular re-education, Psychosocial support, Stair training, UE/LE Strength taining/ROM, Wheelchair propulsion/positioning, Training and development officer, Discharge planning, Functional electrical stimulation, Pain management, Therapeutic Activities, UE/LE Coordination activities, Cognitive remediation/compensation, Functional mobility training, Patient/family education, Splinting/orthotics, Therapeutic Exercise, Visual/perceptual remediation/compensation  OT Interventions Balance/vestibular training, Cognitive remediation/compensation, Discharge planning, DME/adaptive equipment instruction, Functional mobility training, Neuromuscular re-education, Patient/family education, Psychosocial support, Self Care/advanced ADL retraining, Therapeutic Activities, Therapeutic Exercise, UE/LE Strength taining/ROM, UE/LE Coordination activities, Visual/perceptual remediation/compensation  SLP Interventions Cognitive remediation/compensation, Cueing hierarchy, Dysphagia/aspiration precaution training, Functional tasks, Environmental controls, Internal/external aids, Patient/family education  TR Interventions    SW/CM Interventions Discharge Planning, Psychosocial Support, Patient/Family Education   Barriers to Discharge MD  Medical stability  Nursing Incontinence, Nutrition means, Medication compliance    PT      OT      SLP Nutrition means pt is dependent for feeding, eating is very laborous  SW       Team Discharge Planning: Destination: PT-  ,OT- Home  , SLP-Home Projected Follow-up: PT-Home health PT, OT-  Home health OT, SLP-Home Health SLP, 24 hour supervision/assistance, Skilled Nursing facility Projected Equipment Needs: PT-To be determined, OT- To be determined, SLP-None recommended by SLP Equipment Details: PT-pt owns RW, per chart, OT-  Patient/family involved in discharge planning: PT- Patient, Family member/caregiver,  OT-Family member/caregiver, Patient, SLP-Family member/caregiver  MD ELOS: 22-27 days. Medical Rehab Prognosis:  Good Assessment: Dakota [name] is a 84 year old male with history of hypertension, complete heart block status post permanent pacemaker, GERD/Schatzki's ring, PAF-refused Coumadin; who was admitted on 11/16/2017 with left-sided weakness, right gaze preference, slurred speech and mental status changes.  CT head reviewed, showing right basal ganglia infarct.  Per report, acute to subacute right basal ganglia infarct with concern of distal right MCA thrombus.  CTA/P head done revealing a right MCA territory penumbra with moderate to severe M3 branch irregularity.  He underwent cerebral angio with revascularization of the right MCA M2 segment by Dr. Estanislado Pandy.  Patient continued to have issues with confusion and delirium.  Repeat CT head 7/19 showed propagation of acute right MCA territory infarct involving right insula and right temporoparietal lobe.  Dense right insular MCA consistent with thromboembolism.  2D echo done showing EF of 65 to 70% with no wall abnormality, moderately dilated left atrium and no cardiac source of emboli. Dr. Erlinda Hong felt that stroke was embolic due to A. Fib and plans to start Eliquis 5 mg p.o. twice daily on post hospital day 5 if patient stable.  Patient has continued to have issues with delirium, sleep wake cycle disruption and agitation.  Seroquel was added with without improvement therefore Depakote added additionally.  Patient continues to be limited by left-sided weakness with right gaze  preference, requires tactile and verbal cues to follow occasionally one-step command, short-term memory deficits, lethargy and oral phase dysphagia with max cues to initiate swallow. Will set goals for Min/Mod with PT/OT/SLP.  See Team Conference Notes for weekly updates to the plan of care

## 2017-11-21 NOTE — Progress Notes (Signed)
  Speech Language Pathology Treatment: Dysphagia;Cognitive-Linquistic  Patient Details Name: Dakota [name] MRN: [id number] DOB: 21-Mar-1929 Today's Date: 11/21/2017 Time: 1324-4010 SLP Time Calculation (min) (ACUTE ONLY): 25 min  Assessment / Plan / Recommendation Clinical Impression  Pt was seen for skilled ST targeting goals for communication and dysphagia.  Pt was partially sliding out of recliner upon therapist's arrival.  Pt's son reports that pt has been drowsy since reinitiating Seroquel dosage.  Pt was repositioned with assistance from his son to maximize safe PO intake.  Pt needed total assist for self feeding but could comply with recommendation for dry swallow with teaspoons of nectar thick liquids with mod-max assist verbal and tactile cues.  Pt's speech was largely incomprehensible today due to low vocal intensity and rapid rushes of speech.  It was difficult to determine whether pt's responses to questions were pertinent or not due to profoundly decreased intelligibility.  Attempted to instruct pt on taking a deep breath before talking and slowing down to improve intelligibility; however, due to cognitive deficits, pt was unable to return demonstration effectively despite max to total cues. Son voiced concerns over pt's ability to tolerate therapies due to pt's ongoing need for Seroquel to manage agitation and resultant lethargy.  All questions were answered to pt's son's satisfaction.    Pt was left in recliner with son at bedside.    HPI HPI: Dakota [name] is a 84 y.o. male with history of permanent atrial fibrillation (not anticoagulated ), status post [employer] permanent pacemaker for complete heart block, hypertension, hyperlipidemia, chronic back pain, and anxiety presenting with left-sided weakness and right gaze preference. He did not receive IV t-PA due to late presentation. S/P thrombectomy Dr Estanislado Pandy. CT head - Acute to subacute infarct in the right basal ganglia, external  capsule, and posterior operculum. Right distal MCA branch hyperdensity near the operculum likely reflects thrombus.   Today follow up for swallowing indicated as he is s/p MBS yesterday with penetration of nectar and suspected audible aspiration of thin.        SLP Plan  Continue with current plan of care       Recommendations  Diet recommendations: Dysphagia 1 (puree);Nectar-thick liquid Liquids provided via: Teaspoon Medication Administration: Crushed with puree Supervision: Staff to assist with self feeding Compensations: Slow rate;Small sips/bites;Multiple dry swallows after each bite/sip Postural Changes and/or Swallow Maneuvers: Seated upright 90 degrees;Upright 30-60 min after meal                Oral Care Recommendations: Oral care QID Follow up Recommendations: 24 hour supervision/assistance;Other (comment)(possible d/c to CIR today ) SLP Visit Diagnosis: Dysphagia, oropharyngeal phase (R13.12);Dysarthria and anarthria (R47.1);Cognitive communication deficit (R41.841) Plan: Continue with current plan of care       GO                Edris Schneck, Selinda Orion 11/21/2017, 10:23 AM

## 2017-11-22 ENCOUNTER — Inpatient Hospital Stay (HOSPITAL_COMMUNITY): Payer: Medicare Other | Admitting: Occupational Therapy

## 2017-11-22 ENCOUNTER — Inpatient Hospital Stay (HOSPITAL_COMMUNITY): Payer: Medicare Other | Admitting: Speech Pathology

## 2017-11-22 ENCOUNTER — Inpatient Hospital Stay (HOSPITAL_COMMUNITY): Payer: Medicare Other

## 2017-11-22 DIAGNOSIS — E46 Unspecified protein-calorie malnutrition: Secondary | ICD-10-CM

## 2017-11-22 DIAGNOSIS — D72820 Lymphocytosis (symptomatic): Secondary | ICD-10-CM

## 2017-11-22 DIAGNOSIS — R7989 Other specified abnormal findings of blood chemistry: Secondary | ICD-10-CM

## 2017-11-22 DIAGNOSIS — E8809 Other disorders of plasma-protein metabolism, not elsewhere classified: Secondary | ICD-10-CM

## 2017-11-22 DIAGNOSIS — D72829 Elevated white blood cell count, unspecified: Secondary | ICD-10-CM

## 2017-11-22 DIAGNOSIS — I63511 Cerebral infarction due to unspecified occlusion or stenosis of right middle cerebral artery: Secondary | ICD-10-CM

## 2017-11-22 DIAGNOSIS — R7303 Prediabetes: Secondary | ICD-10-CM

## 2017-11-22 DIAGNOSIS — I10 Essential (primary) hypertension: Secondary | ICD-10-CM

## 2017-11-22 LAB — CBC WITH DIFFERENTIAL/PLATELET
Abs Immature Granulocytes: 0.2 10*3/uL — ABNORMAL HIGH (ref 0.0–0.1)
Basophils Absolute: 0.1 10*3/uL (ref 0.0–0.1)
Basophils Relative: 0 %
EOS ABS: 0.7 10*3/uL (ref 0.0–0.7)
EOS PCT: 4 %
HCT: 43.5 % (ref 39.0–52.0)
HEMOGLOBIN: 14.9 g/dL (ref 13.0–17.0)
Immature Granulocytes: 1 %
LYMPHS PCT: 16 %
Lymphs Abs: 2.7 10*3/uL (ref 0.7–4.0)
MCH: 30.2 pg (ref 26.0–34.0)
MCHC: 34.3 g/dL (ref 30.0–36.0)
MCV: 88.1 fL (ref 78.0–100.0)
MONOS PCT: 11 %
Monocytes Absolute: 1.8 10*3/uL — ABNORMAL HIGH (ref 0.1–1.0)
Neutro Abs: 11.3 10*3/uL — ABNORMAL HIGH (ref 1.7–7.7)
Neutrophils Relative %: 68 %
Platelets: 198 10*3/uL (ref 150–400)
RBC: 4.94 MIL/uL (ref 4.22–5.81)
RDW: 12.9 % (ref 11.5–15.5)
WBC: 16.7 10*3/uL — ABNORMAL HIGH (ref 4.0–10.5)

## 2017-11-22 LAB — CUP PACEART REMOTE DEVICE CHECK
Battery Impedance: 747 Ohm
Battery Remaining Longevity: 70 mo
Battery Voltage: 2.75 V
Brady Statistic RV Percent Paced: 62 %
Date Time Interrogation Session: 20190708131440
Implantable Lead Location: 753860
Implantable Lead Model: 5076
Lead Channel Impedance Value: 472 Ohm
Lead Channel Pacing Threshold Amplitude: 0.875 V
Lead Channel Setting Pacing Amplitude: 2.5 V
Lead Channel Setting Pacing Pulse Width: 0.4 ms
MDC IDC LEAD IMPLANT DT: 20160318
MDC IDC MSMT LEADCHNL RA IMPEDANCE VALUE: 0 Ohm
MDC IDC MSMT LEADCHNL RV PACING THRESHOLD PULSEWIDTH: 0.4 ms
MDC IDC PG IMPLANT DT: 20160318
MDC IDC SET LEADCHNL RV SENSING SENSITIVITY: 2 mV

## 2017-11-22 LAB — COMPREHENSIVE METABOLIC PANEL
ALBUMIN: 2.6 g/dL — AB (ref 3.5–5.0)
ALK PHOS: 49 U/L (ref 38–126)
ALT: 31 U/L (ref 0–44)
AST: 24 U/L (ref 15–41)
Anion gap: 7 (ref 5–15)
BUN: 27 mg/dL — AB (ref 8–23)
CALCIUM: 8.8 mg/dL — AB (ref 8.9–10.3)
CHLORIDE: 106 mmol/L (ref 98–111)
CO2: 24 mmol/L (ref 22–32)
Creatinine, Ser: 0.85 mg/dL (ref 0.61–1.24)
GLUCOSE: 122 mg/dL — AB (ref 70–99)
Potassium: 3.9 mmol/L (ref 3.5–5.1)
Sodium: 137 mmol/L (ref 135–145)
Total Bilirubin: 1.4 mg/dL — ABNORMAL HIGH (ref 0.3–1.2)
Total Protein: 6.2 g/dL — ABNORMAL LOW (ref 6.5–8.1)

## 2017-11-22 MED ORDER — ENSURE ENLIVE PO LIQD
237.0000 mL | Freq: Two times a day (BID) | ORAL | Status: DC
  Administered 2017-11-22 – 2017-11-24 (×4): 237 mL via ORAL

## 2017-11-22 MED ORDER — PRO-STAT SUGAR FREE PO LIQD
30.0000 mL | Freq: Two times a day (BID) | ORAL | Status: DC
  Administered 2017-11-22 – 2017-11-24 (×5): 30 mL via ORAL
  Filled 2017-11-22 (×5): qty 30

## 2017-11-22 NOTE — Progress Notes (Signed)
Occupational Therapy Session Note  Patient Details  Name: Dakota [name] MRN: [id number] Date of Birth: 02-12-1929  Today's Date: 11/22/2017 OT Individual Time: 0950-1020 OT Individual Time Calculation (min): 30 min    Short Term Goals: Week 1:  OT Short Term Goal 1 (Week 1): Pt will be able to sit to EOB with max A of 1 in prep for self care. OT Short Term Goal 2 (Week 1): Pt will be able to maintain static sitting EOB with min A in prep for self care. OT Short Term Goal 3 (Week 1): Pt will be able to use LUE to wash abdomen and chest with min cues. OT Short Term Goal 4 (Week 1): Pt will be able to complete bed >< w/c transfer with max A of 1 with either a slide board or a squat pivot.  OT Short Term Goal 5 (Week 1): Pt will identify items in his left visual field with min cues.   Skilled Therapeutic Interventions/Progress Updates:  Pt received supine in bed with low arousal, requiring multimodal cueing to attend to task throughout session. Pt completed self feeding task with R UE with min HOH. Vc provided for increased attention, swallowing, and management of cup. Pt requested to use urinal, requiring max A to position at bed level. Pt then completed rolling R and L to change soiled brief, requiring total A but making efforts to participate. Total A provided to don brief at bed level. Pt was left with call bell in reach, bed alarm set, and all needs met.    Therapy Documentation Precautions:  Precautions Precautions: Fall Precaution Comments: right gaze, left hemiparesis, left lean /pushing Restrictions Weight Bearing Restrictions: No    Vital Signs: Therapy Vitals Pulse Rate: 89 BP: (!) 143/98 Patient Position (if appropriate): Sitting Oxygen Therapy SpO2: 97 % O2 Device: Room Air Pain: Pain Assessment Pain Scale: Faces Pain Score: 0-No pain Faces Pain Scale: No hurt ADL: ADL ADL Comments: refer to functional navigator Vision Baseline Vision/History: No visual  deficits Patient Visual Report: No change from baseline Vision Assessment?: Yes Eye Alignment: Impaired (comment) Ocular Range of Motion: Within Functional Limits Alignment/Gaze Preference: Gaze right Tracking/Visual Pursuits: Decreased smoothness of vertical tracking;Decreased smoothness of horizontal tracking;Unable to hold eye position out of midline Visual Fields: Impaired-to be further tested in functional context Perception  Perception: Impaired Inattention/Neglect: Does not attend to left visual field Spatial Orientation: poor midline awareness Praxis Praxis: Impaired Praxis Impairment Details: Motor planning Praxis-Other Comments: functional motor planning of LUE impaired  See Function Navigator for Current Functional Status.   Therapy/Group: Individual Therapy  Curtis Sites 11/22/2017, 3:02 PM

## 2017-11-22 NOTE — Evaluation (Signed)
Speech Language Pathology Assessment and Plan  Patient Details  Name: Dakota [name] MRN: [id number] Date of Birth: October 24, 1928  SLP Diagnosis: Dysarthria;Cognitive Impairments  Rehab Potential: Fair ELOS: 21-28 days     Today's Date: 11/22/2017 SLP Individual Time: 1304-1400 SLP Individual Time Calculation (min): 56 min   Problem List:  Patient Active Problem List   Diagnosis Date Noted  . Leucocytosis   . Hypoalbuminemia due to protein-calorie malnutrition ([place])   . Prediabetes   . Prerenal azotemia   . Acute delirium 11/21/2017  . Dysphagia due to recent cerebrovascular accident 11/21/2017  . Chewing tobacco nicotine dependence 11/21/2017  . Leukocytosis 11/21/2017  . Acute ischemic right MCA stroke ([place]) 11/21/2017  . Cerebrovascular accident (CVA) due to thrombosis of right middle cerebral artery ([place])   . Acute deep vein thrombosis (DVT) of proximal vein of lower extremity ([name])   . AKI (acute kidney injury) ([place])   . Middle cerebral artery embolism, right s/p endovascular therapy 11/16/2017  . Caregiver stress 10/05/2015  . Hematuria 04/06/2015  . ARF (acute renal failure) ([place]) 07/18/2014  . Cardiogenic shock ([place]) 07/18/2014  . Atrial fibrillation with slow ventricular response ([place])   . NSTEMI (non-ST elevated myocardial infarction) ([place])   . CHB (complete heart block) - s/p MDT 1 lead PPM 07/17/2014  . Advance care planning 07/03/2014  . Sebaceous cyst 04/11/2013  . Medicare annual wellness visit, subsequent 10/28/2012  . RIB PAIN, RIGHT SIDED 01/02/2009  . CAD, NATIVE VESSEL 11/25/2008  . Unspecified vitamin D deficiency 11/24/2008  . RECENT RET DETACH PARTIAL W/SINGLE DEFECT 11/24/2008  . Essential hypertension, benign 07/21/2008  . HLD (hyperlipidemia) 05/22/2007  . Atrial fibrillation ([place]) 05/22/2007  . GERD 05/22/2007  . GASTRIC ULCER, ACUTE, HEMORRHAGE 05/22/2007  . Hyperglycemia 05/22/2007   Past Medical History:  Past Medical History:  Diagnosis  Date  . Anxiety   . Back pain, chronic   . Complete heart block ([place]) 07/18/2014   Medtronic [place] model [id number] (serial number [id number])  singe lead PPM  . Esophageal obstruction due to food impaction 2017  . GERD (gastroesophageal reflux disease) 10/2002  . Hyperlipidemia 12/1994  . Hypertension   . Hypokalemia   . Lung collapse 03/1991   Fall  . Permanent atrial fibrillation ([name])    Refused coumadin therapy   Past Surgical History:  Past Surgical History:  Procedure Laterality Date  . BACK SURGERY  11/91   with hardware fixation  . CARDIAC CATHETERIZATION  06/2004   30 % stenosis, EF normal  . ESOPHAGOGASTRODUODENOSCOPY N/A 01/06/2016   Procedure: ESOPHAGOGASTRODUODENOSCOPY (EGD);  Surgeon: Otis Brace, MD;  Location: [facility];  Service: Gastroenterology;  Laterality: N/A;  . EYE SURGERY     Retinal bubble surgery, cataract   . IR CT HEAD LTD  11/16/2017  . IR PERCUTANEOUS ART THROMBECTOMY/INFUSION INTRACRANIAL INC DIAG ANGIO  11/16/2017  . LEFT HEART CATHETERIZATION WITH CORONARY ANGIOGRAM N/A 07/18/2014   Procedure: LEFT HEART CATHETERIZATION WITH CORONARY ANGIOGRAM;  Surgeon: Jettie Booze, MD; [facility] [place], LAD mild dz, D1 80%, D2 OK, CFX system OK, RCA OK, PDA 100%, med rx  . PERMANENT PACEMAKER INSERTION N/A 07/18/2014   Procedure: PERMANENT PACEMAKER INSERTION;  Surgeon: Thompson Grayer, MD; Medtronic [place] model [id number] (serial number [id number])    . RADIOLOGY WITH ANESTHESIA N/A 11/16/2017   Procedure: RADIOLOGY WITH ANESTHESIA;  Surgeon: Luanne Bras, MD;  Location: [facility];  Service: Radiology;  Laterality: N/A;  . TEMPORARY PACEMAKER INSERTION N/A 07/18/2014   Procedure: TEMPORARY PACEMAKER  INSERTION;  Surgeon: Peter M Martinique, MD;  Location: [facility] CATH LAB;  Service: Cardiovascular;  Laterality: N/A;    Assessment / Plan / Recommendation Clinical Impression   Dakota [name] is a 84 year old male with history of hypertension, complete heart block status post  permanent pacemaker, GERD/Schatzki's ring, PAF-refused Coumadin; who was admitted on 11/16/2017 with left-sided weakness, right gaze preference, slurred speech and mental status changes.  History taken from chart review and son. CT head reviewed, showing right basal ganglia infarct.  Per report, acute to subacute right basal ganglia infarct with concern of distal right MCA thrombus.  CTA/P head done revealing a right MCA territory penumbra with moderate to severe M3 branch irregularity.  He underwent cerebral angio with revascularization of the right MCA M2 segment by Dr. Estanislado [name].  Patient continued to have issues with confusion and delirium.  Repeat CT head 7/19 showed propagation of acute right MCA territory infarct involving right insula and right temporoparietal lobe.  Dense right insular MCA consistent with thromboembolism.  2D echo done showing EF of 65 to 70% with no wall abnormality, moderately dilated left atrium and no cardiac source of emboli.  Dr. Erlinda [name] felt that stroke was embolic due to A. Fib and plans to start Eliquis 5 mg p.o. twice daily on post hospital day 5 if patient stable.  Patient has continued to have issues with delirium, sleep wake cycle disruption and agitation.  Seroquel was added with without improvement therefore Depakote added additionally today.  Patient continues to be limited by left-sided weakness with right gaze preference, requires tactile and verbal cues to follow occasionally one-step command, short-term memory deficits, lethargy and oral phase dysphagia with max cues to initiate swallow. CIR recommended due to functional decline.  SLP evaluation was completed on 11/22/2017 with the following results:  Pt presents with a severe multifactorial dysphagia.  Pt has left sided oral motor weakness which in combination with profound cognitive deficits results in oral holding of boluses, left sided pocketing of purees, and suspected delay in swallow initiation.  Pt is dependent for  feeding due to cognitive deficits and needs total assist for use of recommended swallowing precautions.  Pt only ate ~25% of his meal over an entire hour due to significant physical assist and cuing needed to comply with safe swallowing recommendations.  For now, recommend that pt remain on his currently prescribed diet with full supervision for use of swallowing precautions.   As mentioned above, pt presents with severe cognitive deficits characterized by decreased sustained attention, decreased intellectual awareness of deficits, decreased storage and retrieval of information, and fluctuating orientation which impact all higher level processes.  Pt also has left inattention with right gaze preference.  As a result, pt needed max to total assist to complete basic, familiar self care tasks.   Pt's speech is clearer and more lucid in comparison to prior to CIR admission; however, he still has periods where his responses become incoherent and he needs max cues for redirection to task.   Suspect oral motor weakness is impacting articulatory precision and pt's cognition is impacting pt's ability to self monitor and correct intelligibility.   Given the abovementioned deficits, pt would benefit from skilled ST while inpatient in order to maximize functional independence and reduce burden of care prior to discharge.  Anticipate that pt will need 24/7 supervision at discharge in addition to [place] follow up at next level of care.     Skilled Therapeutic Interventions  Cognitive-linguistic and bedside swallow evaluation completed with results and recommendations reviewed with family.     SLP Assessment  Patient will need skilled Speech Lanaguage Pathology Services during CIR admission    Recommendations  SLP Diet Recommendations: Dysphagia 1 (Puree);Nectar Liquid Administration via: Spoon Medication Administration: Crushed with puree Supervision: Staff to assist with self feeding Compensations: Slow  rate;Small sips/bites;Multiple dry swallows after each bite/sip Postural Changes and/or Swallow Maneuvers: Seated upright 90 degrees;Upright 30-60 min after meal Oral Care Recommendations: Oral care QID Patient destination: Home Follow up Recommendations: Home Health SLP;24 hour supervision/assistance;Skilled Nursing facility Equipment Recommended: None recommended by SLP    SLP Frequency 3 to 5 out of 7 days   SLP Duration  SLP Intensity  SLP Treatment/Interventions 21-28 days   Minumum of 1-2 x/day, 30 to 90 minutes  Cognitive remediation/compensation;Cueing hierarchy;Dysphagia/aspiration precaution training;Functional tasks;Environmental controls;Internal/external aids;Patient/family education    Pain Pain Assessment Pain Scale: 0-10 Pain Score: 0-No pain  Prior Functioning Cognitive/Linguistic Baseline: Within functional limits Type of Home: House  Lives With: Spouse Available Help at Discharge: Family;Available 24 hours/day Vocation: Retired  Function:  Eating Eating   Modified Consistency Diet: Yes Eating Assist Level: Helper feeds patient           Cognition Comprehension Comprehension assist level: Understands basic 50 - 74% of the time/ requires cueing 25 - 49% of the time  Expression   Expression assist level: Expresses basic 25 - 49% of the time/requires cueing 50 - 75% of the time. Uses single words/gestures.  Social Interaction Social Interaction assist level: Interacts appropriately less than 25% of the time. May be withdrawn or combative.  Problem Solving Problem solving assist level: Solves basic less than 25% of the time - needs direction nearly all the time or does not effectively solve problems and may need a restraint for safety  Memory Memory assist level: Recognizes or recalls less than 25% of the time/requires cueing greater than 75% of the time   Short Term Goals: Week 1: SLP Short Term Goal 1 (Week 1): Pt will consume dys 1 textures and  nectar thick liquids via teaspoon with max assist multimodal cues and minimal overt s/s of aspiration.  SLP Short Term Goal 2 (Week 1): Pt will utilize external aids to reorient to place, date, and situation with mod assist multimodal cues.  SLP Short Term Goal 3 (Week 1): Pt will sustain his attention to basic, familiar tasks for ~1 minute intervals with max assist multimodal cues.  SLP Short Term Goal 4 (Week 1): Pt will slow rate and increase vocal intensity to achieve intelligibility at the word level with max assist.   SLP Short Term Goal 5 (Week 1): Pt will locate items to the left of midline during basic familiar tasks in ~50% of opportunities with max assist multimodal cues.   Refer to Care Plan for Long Term Goals  Recommendations for other services: None   Discharge Criteria: Patient will be discharged from SLP if patient refuses treatment 3 consecutive times without medical reason, if treatment goals not met, if there is a change in medical status, if patient makes no progress towards goals or if patient is discharged from hospital.  The above assessment, treatment plan, treatment alternatives and goals were discussed and mutually agreed upon: by patient and by family  Emilio Math 11/22/2017, 2:47 PM

## 2017-11-22 NOTE — Progress Notes (Signed)
Social Work Patient ID: Dakota [name], male   DOB: Oct 24, 1928, 84 y.o.   MRN: [id number]     Harlowe Dowler, [place] Erp  Social Worker    Patient Care Conference  Signed  Date of Service:  11/22/2017  3:31 PM          Signed          Show:Clear all '[x]' Manual'[x]' Template'[]' Copied  Added by: '[x]' Treyshaun Keatts, Gerline Legacy, LCSW   '[]' Hover for details   Inpatient RehabilitationTeam Conference and Plan of Care Update Date: 11/22/2017   Time: 2:50 PM      Patient Name: Dakota [name]      Medical Record Number: [id number]  Date of Birth: Jan 20, 1929 Sex: Male         Room/Bed: 4M12C/4M12C-01 Payor Info: Payor: MEDICARE / Plan: MEDICARE PART A AND B / Product Type: *No Product type* /     Admitting Diagnosis: CVA  Admit Date/Time:  11/21/2017  5:05 PM Admission Comments: No comment available    Primary Diagnosis:  <principal problem not specified> Principal Problem: <principal problem not specified>       Patient Active Problem List    Diagnosis Date Noted  . Leucocytosis    . Hypoalbuminemia due to protein-calorie malnutrition ([place])    . Prediabetes    . Prerenal azotemia    . Acute delirium 11/21/2017  . Dysphagia due to recent cerebrovascular accident 11/21/2017  . Chewing tobacco nicotine dependence 11/21/2017  . Leukocytosis 11/21/2017  . Acute ischemic right MCA stroke ([place]) 11/21/2017  . Cerebrovascular accident (CVA) due to thrombosis of right middle cerebral artery ([place])    . Acute deep vein thrombosis (DVT) of proximal vein of lower extremity ([name])    . AKI (acute kidney injury) ([place])    . Middle cerebral artery embolism, right s/p endovascular therapy 11/16/2017  . Caregiver stress 10/05/2015  . Hematuria 04/06/2015  . ARF (acute renal failure) ([place]) 07/18/2014  . Cardiogenic shock ([place]) 07/18/2014  . Atrial fibrillation with slow ventricular response ([place])    . NSTEMI (non-ST elevated myocardial infarction) ([place])    . CHB (complete heart block) - s/p MDT 1  lead PPM 07/17/2014  . Advance care planning 07/03/2014  . Sebaceous cyst 04/11/2013  . Medicare annual wellness visit, subsequent 10/28/2012  . RIB PAIN, RIGHT SIDED 01/02/2009  . CAD, NATIVE VESSEL 11/25/2008  . Unspecified vitamin D deficiency 11/24/2008  . RECENT RET DETACH PARTIAL W/SINGLE DEFECT 11/24/2008  . Essential hypertension, benign 07/21/2008  . HLD (hyperlipidemia) 05/22/2007  . Atrial fibrillation ([place]) 05/22/2007  . GERD 05/22/2007  . GASTRIC ULCER, ACUTE, HEMORRHAGE 05/22/2007  . Hyperglycemia 05/22/2007      Expected Discharge Date: Expected Discharge Date: (21 to 24 days - date to be set at next week's conference)   Team Members Present: Physician leading conference: Dr. Alysia Penna Social Worker Present: Alfonse Alpers, LCSW Nurse Present: Rayetta Humphrey, RN PT Present: Phylliss Bob, PTA;Other (comment)(Taylor Ervin Knack, PT) OT Present: Willeen Cass, OT SLP Present: Windell Moulding, SLP PPS Coordinator present : Daiva Nakayama, RN, CRRN       Current Status/Progress Goal Weekly Team Focus  Medical     Left-sided weakness, right gaze preference, short-term memory deficits, lethargy, and dysphagia secondary to right MCA infarct  Improve mobility, safety, cognition, WBCs, lethargy  See above   Bowel/Bladder     Incontient of bowel and bladder; Last BM 11/21/17  To have fewer or no episodes of incontinence   Assist pt with voiding schedule  based on pt's needs   Swallow/Nutrition/ Hydration     eval pending         ADL's     eval pending         Mobility     eval pending         Communication     eval pending         Safety/Cognition/ Behavioral Observations   eval pending         Pain     No c/o pain at this time  Remains free of pain  assess pain q shift and PRN; Tx prn   Skin     Sacrum pink (blanches), L shoulder blade skin tear, Abrasion to Left side of back, Abrasion to R shin  Remains free from any further skin issues  Turn and reposition pt q 2  hrs, assess skin q shift and prn     Rehab Goals Patient on target to meet rehab goals: Yes Rehab Goals Revised: none - pt's first conference on day of evals *See Care Plan and progress notes for long and short-term goals.      Barriers to Discharge   Current Status/Progress Possible Resolutions Date Resolved   Physician     Medical stability;Other (comments);Incontinence  Cognition     Therapies, follow labs, abx workup, optimize DM meds      Nursing   Incontinence;Nutrition means;Medication compliance             PT                    OT                 SLP Nutrition means pt is dependent for feeding, eating is very laborous           SW              Discharge Planning/Teaching Needs:  Pt's sons have stated they will care for pt at home at d/c.  CSW to confirm this.  Family education to be offered closer to d/c.   Team Discussion:  Pt with high blood pressure at baseline, but also has an elevated white blood count, which so far has a negative workup. Pt is not drinking enough and this is impacting his kidneys, so MD is watching this.  MD is also trying to find the balance between keeping pt awake and him not being agitated.  Pt is incontinent x 2.  Pt with min A goals overall for OT, but these may be downgraded to mod A due to alertness level.  Pt completed ADLs at bed level today and was +2 total A.  PT used assist of 2 today.  ST stated that pt is more clear today than yesterday and he knew he had had a stroke.  Pt's lunch took 1 hour of total A and he only consumed 25% of his tray.  He's on nectar thick liquids and uses all swallowing strategies, which is exhausting.  Min to mod A ST goals.  Revisions to Treatment Plan:  none    Continued Need for Acute Rehabilitation Level of Care: The patient requires daily medical management by a physician with specialized training in physical medicine and rehabilitation for the following conditions: Daily direction of a multidisciplinary  physical rehabilitation program to ensure safe treatment while eliciting the highest outcome that is of practical value to the patient.: Yes Daily medical management of patient stability for increased activity during  participation in an intensive rehabilitation regime.: Yes Daily analysis of laboratory values and/or radiology reports with any subsequent need for medication adjustment of medical intervention for : Neurological problems;Diabetes problems;Mood/behavior problems;Blood pressure problems;Other   Denishia Citro, Silvestre Mesi 11/22/2017, 3:31 PM

## 2017-11-22 NOTE — Evaluation (Signed)
Occupational Therapy Assessment and Plan  Patient Details  Name: PRANEETH BUSSEY MRN: [id number] Date of Birth: Jun 24, 1928  OT Diagnosis: abnormal posture, apraxia, cognitive deficits, disturbance of vision, hemiplegia affecting non-dominant side and muscle weakness (generalized) Rehab Potential: Rehab Potential (ACUTE ONLY): Good ELOS: 21-24 days   Today's Date: 11/22/2017 OT Individual Time: 3546-5681 OT Individual Time Calculation (min): 60 min     Problem List:  Patient Active Problem List   Diagnosis Date Noted  . Leucocytosis   . Hypoalbuminemia due to protein-calorie malnutrition ([place])   . Prediabetes   . Prerenal azotemia   . Acute delirium 11/21/2017  . Dysphagia due to recent cerebrovascular accident 11/21/2017  . Chewing tobacco nicotine dependence 11/21/2017  . Leukocytosis 11/21/2017  . Acute ischemic right MCA stroke ([place]) 11/21/2017  . Cerebrovascular accident (CVA) due to thrombosis of right middle cerebral artery ([place])   . Acute deep vein thrombosis (DVT) of proximal vein of lower extremity ([name])   . AKI (acute kidney injury) ([place])   . Middle cerebral artery embolism, right s/p endovascular therapy 11/16/2017  . Caregiver stress 10/05/2015  . Hematuria 04/06/2015  . ARF (acute renal failure) ([place]) 07/18/2014  . Cardiogenic shock ([place]) 07/18/2014  . Atrial fibrillation with slow ventricular response ([place])   . NSTEMI (non-ST elevated myocardial infarction) ([place])   . CHB (complete heart block) - s/p MDT 1 lead PPM 07/17/2014  . Advance care planning 07/03/2014  . Sebaceous cyst 04/11/2013  . Medicare annual wellness visit, subsequent 10/28/2012  . RIB PAIN, RIGHT SIDED 01/02/2009  . CAD, NATIVE VESSEL 11/25/2008  . Unspecified vitamin D deficiency 11/24/2008  . RECENT RET DETACH PARTIAL W/SINGLE DEFECT 11/24/2008  . Essential hypertension, benign 07/21/2008  . HLD (hyperlipidemia) 05/22/2007  . Atrial fibrillation ([place]) 05/22/2007  . GERD 05/22/2007  .  GASTRIC ULCER, ACUTE, HEMORRHAGE 05/22/2007  . Hyperglycemia 05/22/2007    Past Medical History:  Past Medical History:  Diagnosis Date  . Anxiety   . Back pain, chronic   . Complete heart block ([place]) 07/18/2014   [employer] [place] model [id number] (serial number [id number])  singe lead PPM  . Esophageal obstruction due to food impaction 2017  . GERD (gastroesophageal reflux disease) 10/2002  . Hyperlipidemia 12/1994  . Hypertension   . Hypokalemia   . Lung collapse 03/1991   Fall  . Permanent atrial fibrillation ([name])    Refused coumadin therapy   Past Surgical History:  Past Surgical History:  Procedure Laterality Date  . BACK SURGERY  11/91   with hardware fixation  . CARDIAC CATHETERIZATION  06/2004   30 % stenosis, EF normal  . ESOPHAGOGASTRODUODENOSCOPY N/A 01/06/2016   Procedure: ESOPHAGOGASTRODUODENOSCOPY (EGD);  Surgeon: Otis Brace, MD;  Location: [facility];  Service: Gastroenterology;  Laterality: N/A;  . EYE SURGERY     Retinal bubble surgery, cataract   . IR CT HEAD LTD  11/16/2017  . IR PERCUTANEOUS ART THROMBECTOMY/INFUSION INTRACRANIAL INC DIAG ANGIO  11/16/2017  . LEFT HEART CATHETERIZATION WITH CORONARY ANGIOGRAM N/A 07/18/2014   Procedure: LEFT HEART CATHETERIZATION WITH CORONARY ANGIOGRAM;  Surgeon: Jettie Booze, MD; [employer] [place], LAD mild dz, D1 80%, D2 OK, CFX system OK, RCA OK, PDA 100%, med rx  . PERMANENT PACEMAKER INSERTION N/A 07/18/2014   Procedure: PERMANENT PACEMAKER INSERTION;  Surgeon: Thompson Grayer, MD; Medtronic [place] model [phone] (serial number [id number])    . RADIOLOGY WITH ANESTHESIA N/A 11/16/2017   Procedure: RADIOLOGY WITH ANESTHESIA;  Surgeon: Luanne Bras, MD;  Location: [facility];  Service: Radiology;  Laterality: N/A;  . TEMPORARY PACEMAKER INSERTION N/A 07/18/2014   Procedure: TEMPORARY PACEMAKER INSERTION;  Surgeon: Peter M Martinique, MD;  Location: [facility] CATH LAB;  Service: Cardiovascular;  Laterality: N/A;    Assessment &  Plan Clinical Impression: Jaquavis L. Muhammed is a 84 year old male with history of hypertension, complete heart block status post permanent pacemaker, GERD/Schatzki's ring, PAF-refused Coumadin; who was admitted on 11/16/2017 with left-sided weakness, right gaze preference, slurred speech and mental status changes.  History taken from chart review and son. CT head reviewed, showing right basal ganglia infarct.  Per report, acute to subacute right basal ganglia infarct with concern of distal right MCA thrombus.  CTA/P head done revealing a right MCA territory penumbra with moderate to severe M3 branch irregularity.  He underwent cerebral angio with revascularization of the right MCA M2 segment by Dr. Estanislado Pandy.  Patient continued to have issues with confusion and delirium.  Repeat CT head 7/19 showed propagation of acute right MCA territory infarct involving right insula and right temporoparietal lobe.  Dense right insular MCA consistent with thromboembolism.  2D echo done showing EF of 65 to 70% with no wall abnormality, moderately dilated left atrium and no cardiac source of emboli.   Dr. Erlinda Hong felt that stroke was embolic due to A. Fib and plans to start Eliquis 5 mg p.o. twice daily on post hospital day 5 if patient stable.  Patient has continued to have issues with delirium, sleep wake cycle disruption and agitation.  Seroquel was added with without improvement therefore Depakote added additionally today.  Patient continues to be limited by left-sided weakness with right gaze preference, requires tactile and verbal cues to follow occasionally one-step command, short-term memory deficits, lethargy and oral phase dysphagia with max cues to initiate swallow. CIR recommended due to functional decline.  .  Patient transferred to CIR on 11/21/2017 .    Patient currently requires total with basic self-care skills secondary to muscle weakness and muscle joint tightness, decreased cardiorespiratoy endurance, unbalanced  muscle activation and decreased motor planning, decreased visual perceptual skills, decreased visual motor skills and field cut, decreased attention to left, decreased initiation, decreased attention, decreased awareness, decreased problem solving, decreased safety awareness, decreased memory and delayed processing and decreased sitting balance, decreased standing balance, decreased postural control, hemiplegia and decreased balance strategies.  Prior to hospitalization, patient was very independent and took care of his wife.  Patient will benefit from skilled intervention to increase independence with basic self-care skills prior to discharge home with care partner.  Anticipate patient will require 24 hour supervision and minimal physical assistance and follow up home health.  OT - End of Session Activity Tolerance: Tolerates < 10 min activity, no significant change in vital signs Endurance Deficit: Yes Endurance Deficit Description: Pt kept falling asleep during the eval OT Assessment Rehab Potential (ACUTE ONLY): Good OT Patient demonstrates impairments in the following area(s): Balance;Cognition;Endurance;Motor;Perception;Safety;Sensory;Vision OT Basic ADL's Functional Problem(s): Eating;Grooming;Bathing;Dressing;Toileting OT Transfers Functional Problem(s): Toilet;Tub/Shower OT Additional Impairment(s): Fuctional Use of Upper Extremity OT Plan OT Intensity: Minimum of 1-2 x/day, 45 to 90 minutes OT Frequency: 5 out of 7 days OT Duration/Estimated Length of Stay: 21-24 days OT Treatment/Interventions: Balance/vestibular training;Cognitive remediation/compensation;Discharge planning;DME/adaptive equipment instruction;Functional mobility training;Neuromuscular re-education;Patient/family education;Psychosocial support;Self Care/advanced ADL retraining;Therapeutic Activities;Therapeutic Exercise;UE/LE Strength taining/ROM;UE/LE Coordination activities;Visual/perceptual remediation/compensation OT  Self Feeding Anticipated Outcome(s): set up OT Basic Self-Care Anticipated Outcome(s): min A OT Toileting Anticipated Outcome(s): mod A OT Bathroom Transfers Anticipated Outcome(s): min A to toilet, mod A to  tub bench OT Recommendation Patient destination: Home Follow Up Recommendations: Home health OT Equipment Recommended: To be determined   Skilled Therapeutic Intervention Pt seen for initial evaluation, family education, and initiation of self care training.  Pt received in bed and was alert on and off throughout the session.  Pt's son present to answer questions.  Pt received in bed and needed some prompting to fully open his eyes and attend to therapist. Pt rolled side to side with max A and decreased motor planning with reaching with B arms.  LB bathing from bed.  Used hospital gown only as pt had continuous IV.   He sat to EOB with total A of 2 and presented with a heavy left lean.  Pt followed cues to use R hand to hold bed rail for support.  UB bathing from sitting with pt needing full A as he was having difficulty following directions, but he was able to partially wash his face.  Attempted to have pt stand with stedy. He was able to use B arms to pull up to stand with max A of 2 but continued to have a strong L lean in stedy.  This method of transfer will not be safe for now.   Pt adjusted back into supine and pt actively without cuing used B arms to reach overhead to assist with pulling himself up in bed.    He was able to answer questions with delay processing using very soft barely intelligible voice.  Discussed recommendations for LOS and expected goals and estimated POC with pt and son.  Pt resting in bed with son in room with patient.    OT Evaluation Precautions/Restrictions  Precautions Precautions: Fall Precaution Comments: right gaze, left hemiparesis, left lean  Restrictions Weight Bearing Restrictions: No   Pain Pain Assessment Pain Score: 0-No pain Faces Pain Scale:  No hurt Home Living/Prior Functioning Home Living Available Help at Discharge: Family, Available 24 hours/day Type of Home: House Home Access: Ramped entrance Home Layout: One level Bathroom Shower/Tub: Gaffer, Chiropodist: Standard Bathroom Accessibility: Yes Additional Comments: pt was primary caregiver for wife who does not walk, pt would transfer her and family assists with bathing  Lives With: Spouse Prior Function Level of Independence: Independent with basic ADLs, Independent with homemaking with ambulation, Independent with gait, Independent with transfers  Able to Take Stairs?: Yes Driving: Yes Vocation: Retired Comments: very independent, 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 Pt has 4 kids who can assist ADL ADL ADL Comments: refer to functional navigator Vision Baseline Vision/History: No visual deficits Patient Visual Report: No change from baseline Vision Assessment?: Yes Eye Alignment: Impaired (comment) Ocular Range of Motion: Within Functional Limits Alignment/Gaze Preference: Gaze right Tracking/Visual Pursuits: Decreased smoothness of vertical tracking;Decreased smoothness of horizontal tracking;Unable to hold eye position out of midline Visual Fields: Left visual field deficit(R eye only) Perception  Perception: Impaired Inattention/Neglect: Does not attend to left visual field Spatial Orientation: poor midline awareness Praxis Praxis: Impaired Praxis Impairment Details: Motor planning Praxis-Other Comments: functional motor planning of LUE impaired Cognition Overall Cognitive Status: Impaired/Different from baseline Arousal/Alertness: Lethargic Orientation Level: Person;Place;Situation Person: Oriented Place: Oriented Situation: Oriented Year: 2019 Month: July Day of Week: Incorrect Memory: Impaired Memory Impairment: Decreased short term memory Decreased Short Term Memory: Verbal basic Immediate Memory Recall:  Sock;Blue;Bed Memory Recall: Blue Memory Recall Blue: With Cue Sustained Attention: Impaired Sustained Attention Impairment: Verbal basic Awareness: Impaired Awareness Impairment: Intellectual impairment Problem Solving: Impaired Problem Solving Impairment:  Verbal basic;Functional basic Behaviors: Perseveration Sensation Sensation Light Touch: Appears Intact Hot/Cold: Not tested Proprioception: Not tested Stereognosis: Not tested Coordination Gross Motor Movements are Fluid and Coordinated: No Fine Motor Movements are Fluid and Coordinated: No Coordination and Movement Description: L hemiparesis Finger Nose Finger Test: not tested due to delayed cognition Motor  Motor Motor: Hemiplegia;Motor perseverations Mobility    pt was not safe with use of STEDY due to strong L lean, will need to use maxilift for now. Trunk/Postural Assessment  Cervical Assessment Cervical Assessment: Exceptions to WFL(decreased neck ROM) Thoracic Assessment Thoracic Assessment: Exceptions to WFL(kyphotic posture) Lumbar Assessment Lumbar Assessment: Exceptions to WFL(posterior pelvic tilt) Postural Control Postural Control: Deficits on evaluation Trunk Control: L lean in sitting and standing with minimal activity in L trunk musculature Righting Reactions: severely limited Protective Responses: severely limited  Balance Static Sitting Balance Static Sitting - Level of Assistance: 2: Max assist(pt relying heavily on R arm for support, pt leans to L) Static Standing Balance Static Standing - Level of Assistance: 1: +2 Total assist(pt unable to come to a full stand) Extremity/Trunk Assessment RUE Assessment RUE Assessment: Within Functional Limits LUE Assessment LUE Assessment: Exceptions to [facility] Passive Range of Motion (PROM) Comments: WFL Active Range of Motion (AROM) Comments: sh flexion limited to 40 degrees,  elbow flexion 90 General Strength Comments: 3+/5 grasp LUE Body System:  Neuro Brunstrum levels for arm and hand: Arm;Hand Brunstrum level for arm: Stage IV Movement is deviating from synergy Brunstrum level for hand: Stage V Independence from basic synergies LUE Tone LUE Tone: Within Functional Limits   See Function Navigator for Current Functional Status.   Refer to Care Plan for Long Term Goals  Recommendations for other services: None    Discharge Criteria: Patient will be discharged from OT if patient refuses treatment 3 consecutive times without medical reason, if treatment goals not met, if there is a change in medical status, if patient makes no progress towards goals or if patient is discharged from hospital.  The above assessment, treatment plan, treatment alternatives and goals were discussed and mutually agreed upon: by patient and by family  SAGUIER,JULIA 11/22/2017, 12:03 PM

## 2017-11-22 NOTE — Progress Notes (Signed)
[facility] PHYSICAL MEDICINE & REHABILITATION     PROGRESS NOTE  Subjective/Complaints:  Pt seen sitting up in his chair.  He states he slept better overnight.  Son at bedside.    ROS: Unreliable due to cognition  Objective: Vital Signs: Blood pressure 131/78, pulse 74, temperature 98 F (36.7 C), temperature source Axillary, resp. rate 18, height 5\' 7"  (1.702 m), weight 81.7 kg (180 lb 1.9 oz), SpO2 95 %. Dg Chest 2 View  Result Date: 11/21/2017 CLINICAL DATA:  Leukocytosis. EXAM: CHEST - 2 VIEW COMPARISON:  Radiograph of November 18, 2017. FINDINGS: Stable cardiomegaly. Single lead left-sided pacemaker is unchanged in position. No pneumothorax or pleural effusion is noted. Both lungs are clear. The visualized skeletal structures are unremarkable. IMPRESSION: No active cardiopulmonary disease. Electronically Signed   By: Marijo Conception, M.D.   On: 11/21/2017 21:49   Recent Labs    11/21/17 0347 11/22/17 0715  WBC 18.4* 16.7*  HGB 16.1 14.9  HCT 48.0 43.5  PLT 183 198   Recent Labs    11/21/17 0347 11/22/17 0715  NA 136 137  K 3.7 3.9  CL 104 106  GLUCOSE 123* 122*  BUN 33* 27*  CREATININE 0.90 0.85  CALCIUM 9.1 8.8*   CBG (last 3)  No results for input(s): GLUCAP in the last 72 hours.  Wt Readings from Last 3 Encounters:  11/21/17 81.7 kg (180 lb 1.9 oz)  11/18/17 85.5 kg (188 lb 7.9 oz)  11/01/17 85.4 kg (188 lb 3.2 oz)    Physical Exam:  BP 131/78 (BP Location: Right Arm)   Pulse 74   Temp 98 F (36.7 C) (Axillary)   Resp 18   Ht 5\' 7"  (1.702 m)   Wt 81.7 kg (180 lb 1.9 oz)   SpO2 95%   BMI 28.21 kg/m  Constitutional: He appears well-developed. Frail male. HENT: Normocephalic and atraumatic.  Eyes: No discharge.  Cardiovascular: An irregularly irregular rhythm present. No JVD. Respiratory: Effort normal and breath sounds normal.  GI: Bowel sounds are normal. Nondistended Musculoskeletal:  No edema or tenderness in extremities.  Neurological:   Lethargic, slowed Severe dysarthria  Left neglect with left sided weakness--unable to participate in MMT due to neglect on left side.   Skin: Skin is warm and dry.  Multiple healing abrasions bilateral shins. Well healed laceration right elbow/forearm.   Psychiatric: Unable to assess due to cognition  Assessment/Plan: 1. Functional deficits secondary to right MCA infarct which require 3+ hours per day of interdisciplinary therapy in a comprehensive inpatient rehab setting. Physiatrist is providing close team supervision and 24 hour management of active medical problems listed below. Physiatrist and rehab team continue to assess barriers to discharge/monitor patient progress toward functional and medical goals.  Function:  Bathing Bathing position      Bathing parts      Bathing assist        Upper Body Dressing/Undressing Upper body dressing                    Upper body assist        Lower Body Dressing/Undressing Lower body dressing                                  Lower body assist        Toileting Toileting          Toileting assist     Transfers  Chair/bed Physiological scientist Comprehension Comprehension assist level: Understands basic less than 25% of the time/ requires cueing >75% of the time  Expression Expression assist level: Expresses basis less than 25% of the time/requires cueing >75% of the time.  Social Interaction Social Interaction assist level: Interacts appropriately less than 25% of the time. May be withdrawn or combative.  Problem Solving Problem solving assist level: Solves basic less than 25% of the time - needs direction nearly all the time or does not effectively solve problems and may need a restraint for safety  Memory Memory assist level: Recognizes or recalls less than 25% of the time/requires cueing greater than 75% of the time    Medical  Problem List and Plan: 1.  Left-sided weakness, right gaze preference, short-term memory deficits, lethargy, and dysphagia secondary to right MCA infarct.  Begin CIR 2.  DVT Prophylaxis/Anticoagulation: Pharmaceutical: Eliquis  3. Pain Management: tylenol prn.  4. Mood: LCSW to follow for evaluation  5. Neuropsych: This patient is not capable of making decisions on his own behalf. 6. Skin/Wound Care: routine pressure relief measures.  7. Fluids/Electrolytes/Nutrition: Monitor I/O.  8. Hospital psychosis/Delirium: Family reports delirium with every admission which gets better once at home. Question unmasking of dementia complicated by sleep wake disruption as well as deficits due to stroke.   D/ced am dose Seroquel.   Depakote added help with agitation--monitor for SE.   Sleep wake chart.  9. A fib: Monitor HR bid. On coreg bid.  10. Pre-renal azotemia:   Started calorie count to monitor intake. Offer magic cup with meals.  Changed IVF to hs.   BUN/Cr/creatinine improving on 7/24 11. Leucocytosis:   WBCs 16.7 on 7/24  Afebrile  UA essentially negative, urine culture pending   CXR  reviewed, relatively unremarkable per infectious process 12. HTN: Monitor BP bid. On HCTZ and Coreg.   Monitor with increased mobility 13. Prediabetes  Monitor with increased mobility 4. Hypoalbuminemia  Supplement initiated on 7/24  LOS (Days) 1 A FACE TO FACE EVALUATION WAS PERFORMED  Shantara Goosby [name] 11/22/2017 8:25 AM

## 2017-11-22 NOTE — Progress Notes (Addendum)
Initial Nutrition Assessment  DOCUMENTATION CODES:   Not applicable  INTERVENTION:  Provide Ensure Enlive po BID (thickened to nectar thick consistency), each supplement provides 350 kcal and 20 grams of protein.  Continue 30 ml Prostat po BID, each supplement provides 100 kcal and 15 grams of protein.   72 hour calorie count initiated.  NUTRITION DIAGNOSIS:   Inadequate oral intake related to dysphagia as evidenced by meal completion < 50%.  GOAL:   Patient will meet greater than or equal to 90% of their needs  MONITOR:   PO intake, Supplement acceptance, Diet advancement, Weight trends, Labs, Skin, I & O's  REASON FOR ASSESSMENT:   Consult Calorie Count  ASSESSMENT:   84 year old male with history of hypertension, complete heart block status post permanent pacemaker, GERD/Schatzki's ring, PAF-refused Coumadin; who was admitted on 11/16/2017 with left-sided weakness, right gaze preference, slurred speech and mental status changes. CT head reviewed, showing right basal ganglia infarct  Meal completion has been 10-25%. Family at bedside reports pt usually consumes 3 meals a day at home. Breakfast includes eggs with 4 pieces of bacon, lunch is usually a meals on wheels food items, and dinner would be left overs. Family suspects dinner would be a very small meal. Noted pt with a 4% weight loss in 20 days. RD to continue to monitor. MD has ordered calorie count as pt with poor po intake. RD to follow up tomorrow with day 1 calorie count results. Pt currently has Prostat ordered. Will additionally order Ensure to aid in caloric and protein needs.    Labs and medications reviewed.   NUTRITION - FOCUSED PHYSICAL EXAM:  Depletion likely related to the natural aging process.     Most Recent Value  Orbital Region  Unable to assess  Upper Arm Region  Moderate depletion  Thoracic and Lumbar Region  No depletion  Buccal Region  Unable to assess  Temple Region  Unable to assess   Clavicle Bone Region  Mild depletion  Clavicle and Acromion Bone Region  No depletion  Scapular Bone Region  Unable to assess  Dorsal Hand  Unable to assess  Patellar Region  No depletion  Anterior Thigh Region  Mild depletion  Posterior Calf Region  Mild depletion  Edema (RD Assessment)  None  Hair  Reviewed  Eyes  Reviewed  Mouth  Reviewed  Skin  Reviewed  Nails  Reviewed       Diet Order:   Diet Order           DIET - DYS 1 Room service appropriate? Yes; Fluid consistency: Nectar Thick  Diet effective now          EDUCATION NEEDS:   Not appropriate for education at this time  Skin:  Skin Assessment: Reviewed RN Assessment  Last BM:  7/23  Height:   Ht Readings from Last 1 Encounters:  11/21/17 5\' 7"  (1.702 m)    Weight:   Wt Readings from Last 1 Encounters:  11/21/17 180 lb 1.9 oz (81.7 kg)    Ideal Body Weight:  67.27 kg  BMI:  Body mass index is 28.21 kg/m.  Estimated Nutritional Needs:   Kcal:  1700-1900  Protein:  75-85 grams  Fluid:  >/= 1.7 L/day    Corrin Parker, MS, RD, LDN Pager # [phone] After hours/ weekend pager # [phone]

## 2017-11-22 NOTE — Patient Care Conference (Signed)
Inpatient RehabilitationTeam Conference and Plan of Care Update Date: 11/22/2017   Time: 2:50 PM    Patient Name: Dakota [name]      Medical Record Number: [id number]  Date of Birth: Apr 14, 1929 Sex: Male         Room/Bed: 4M12C/4M12C-01 Payor Info: Payor: MEDICARE / Plan: MEDICARE PART A AND B / Product Type: *No Product type* /    Admitting Diagnosis: CVA  Admit Date/Time:  11/21/2017  5:05 PM Admission Comments: No comment available   Primary Diagnosis:  <principal problem not specified> Principal Problem: <principal problem not specified>  Patient Active Problem List   Diagnosis Date Noted  . Leucocytosis   . Hypoalbuminemia due to protein-calorie malnutrition ([name])   . Prediabetes   . Prerenal azotemia   . Acute delirium 11/21/2017  . Dysphagia due to recent cerebrovascular accident 11/21/2017  . Chewing tobacco nicotine dependence 11/21/2017  . Leukocytosis 11/21/2017  . Acute ischemic right MCA stroke ([name]) 11/21/2017  . Cerebrovascular accident (CVA) due to thrombosis of right middle cerebral artery ([name])   . Acute deep vein thrombosis (DVT) of proximal vein of lower extremity ([name])   . AKI (acute kidney injury) ([name])   . Middle cerebral artery embolism, right s/p endovascular therapy 11/16/2017  . Caregiver stress 10/05/2015  . Hematuria 04/06/2015  . ARF (acute renal failure) ([name]) 07/18/2014  . Cardiogenic shock ([name]) 07/18/2014  . Atrial fibrillation with slow ventricular response ([name])   . NSTEMI (non-ST elevated myocardial infarction) ([name])   . CHB (complete heart block) - s/p MDT 1 lead PPM 07/17/2014  . Advance care planning 07/03/2014  . Sebaceous cyst 04/11/2013  . Medicare annual wellness visit, subsequent 10/28/2012  . RIB PAIN, RIGHT SIDED 01/02/2009  . CAD, NATIVE VESSEL 11/25/2008  . Unspecified vitamin D deficiency 11/24/2008  . RECENT RET DETACH PARTIAL W/SINGLE DEFECT 11/24/2008  . Essential hypertension, benign 07/21/2008  . HLD (hyperlipidemia)  05/22/2007  . Atrial fibrillation ([name]) 05/22/2007  . GERD 05/22/2007  . GASTRIC ULCER, ACUTE, HEMORRHAGE 05/22/2007  . Hyperglycemia 05/22/2007    Expected Discharge Date: Expected Discharge Date: (21 to 24 days - date to be set at next week's conference)  Team Members Present: Physician leading conference: Dr. Alysia Penna Social Worker Present: Alfonse Alpers, LCSW Nurse Present: Rayetta Humphrey, RN PT Present: Phylliss Bob, PTA;Other (comment)(Taylor Ervin Knack, PT) OT Present: Willeen Cass, OT SLP Present: Windell Moulding, SLP PPS Coordinator present : Daiva Nakayama, RN, CRRN     Current Status/Progress Goal Weekly Team Focus  Medical   Left-sided weakness, right gaze preference, short-term memory deficits, lethargy, and dysphagia secondary to right MCA infarct  Improve mobility, safety, cognition, WBCs, lethargy  See above   Bowel/Bladder   Incontient of bowel and bladder; Last BM 11/21/17  To have fewer or no episodes of incontinence   Assist pt with voiding schedule based on pt's needs   Swallow/Nutrition/ Hydration   eval pending         ADL's   eval pending         Mobility   eval pending         Communication   eval pending         Safety/Cognition/ Behavioral Observations  eval pending         Pain   No c/o pain at this time  Remains free of pain  assess pain q shift and PRN; Tx prn   Skin   Sacrum pink (blanches), L shoulder blade skin tear, Abrasion  to Left side of back, Abrasion to R shin  Remains free from any further skin issues  Turn and reposition pt q 2 hrs, assess skin q shift and prn    Rehab Goals Patient on target to meet rehab goals: Yes Rehab Goals Revised: none - pt's first conference on day of evals *See Care Plan and progress notes for long and short-term goals.     Barriers to Discharge  Current Status/Progress Possible Resolutions Date Resolved   Physician    Medical stability;Other (comments);Incontinence  Cognition     Therapies, follow  labs, abx workup, optimize DM meds      Nursing  Incontinence;Nutrition means;Medication compliance               PT                    OT                  SLP Nutrition means pt is dependent for feeding, eating is very laborous            SW                Discharge Planning/Teaching Needs:  Pt's sons have stated they will care for pt at home at d/c.  CSW to confirm this.  Family education to be offered closer to d/c.   Team Discussion:  Pt with high blood pressure at baseline, but also has an elevated white blood count, which so far has a negative workup. Pt is not drinking enough and this is impacting his kidneys, so MD is watching this.  MD is also trying to find the balance between keeping pt awake and him not being agitated.  Pt is incontinent x 2.  Pt with min A goals overall for OT, but these may be downgraded to mod A due to alertness level.  Pt completed ADLs at bed level today and was +2 total A.  PT used assist of 2 today.  ST stated that pt is more clear today than yesterday and he knew he had had a stroke.  Pt's lunch took 1 hour of total A and he only consumed 25% of his tray.  He's on nectar thick liquids and uses all swallowing strategies, which is exhausting.  Min to mod A ST goals.  Revisions to Treatment Plan:  none    Continued Need for Acute Rehabilitation Level of Care: The patient requires daily medical management by a physician with specialized training in physical medicine and rehabilitation for the following conditions: Daily direction of a multidisciplinary physical rehabilitation program to ensure safe treatment while eliciting the highest outcome that is of practical value to the patient.: Yes Daily medical management of patient stability for increased activity during participation in an intensive rehabilitation regime.: Yes Daily analysis of laboratory values and/or radiology reports with any subsequent need for medication adjustment of medical intervention for  : Neurological problems;Diabetes problems;Mood/behavior problems;Blood pressure problems;Other  Kail Fraley, Silvestre Mesi 11/22/2017, 3:31 PM

## 2017-11-22 NOTE — Evaluation (Signed)
Physical Therapy Assessment and Plan  Patient Details  Name: Dakota [name] MRN: [id number] Date of Birth: 1928-09-01  PT Diagnosis: Abnormal posture, Abnormality of gait, Cognitive deficits, Hemiparesis non-dominant and Hypertonia Rehab Potential: Good ELOS: 21-24   Today's Date: 11/22/2017 PT Individual Time: 1100-1205 PT Individual Time Calculation (min): 65 min    Problem List:  Patient Active Problem List   Diagnosis Date Noted  . Leucocytosis   . Hypoalbuminemia due to protein-calorie malnutrition ([place])   . Prediabetes   . Prerenal azotemia   . Acute delirium 11/21/2017  . Dysphagia due to recent cerebrovascular accident 11/21/2017  . Chewing tobacco nicotine dependence 11/21/2017  . Leukocytosis 11/21/2017  . Acute ischemic right MCA stroke ([place]) 11/21/2017  . Cerebrovascular accident (CVA) due to thrombosis of right middle cerebral artery ([place])   . Acute deep vein thrombosis (DVT) of proximal vein of lower extremity ([name])   . AKI (acute kidney injury) ([place])   . Middle cerebral artery embolism, right s/p endovascular therapy 11/16/2017  . Caregiver stress 10/05/2015  . Hematuria 04/06/2015  . ARF (acute renal failure) ([place]) 07/18/2014  . Cardiogenic shock ([place]) 07/18/2014  . Atrial fibrillation with slow ventricular response ([place])   . NSTEMI (non-ST elevated myocardial infarction) ([place])   . CHB (complete heart block) - s/p MDT 1 lead PPM 07/17/2014  . Advance care planning 07/03/2014  . Sebaceous cyst 04/11/2013  . Medicare annual wellness visit, subsequent 10/28/2012  . RIB PAIN, RIGHT SIDED 01/02/2009  . CAD, NATIVE VESSEL 11/25/2008  . Unspecified vitamin D deficiency 11/24/2008  . RECENT RET DETACH PARTIAL W/SINGLE DEFECT 11/24/2008  . Essential hypertension, benign 07/21/2008  . HLD (hyperlipidemia) 05/22/2007  . Atrial fibrillation ([place]) 05/22/2007  . GERD 05/22/2007  . GASTRIC ULCER, ACUTE, HEMORRHAGE 05/22/2007  . Hyperglycemia 05/22/2007    Past  Medical History:  Past Medical History:  Diagnosis Date  . Anxiety   . Back pain, chronic   . Complete heart block ([place]) 07/18/2014   Medtronic [place] model [id number] (serial number [id number])  singe lead PPM  . Esophageal obstruction due to food impaction 2017  . GERD (gastroesophageal reflux disease) 10/2002  . Hyperlipidemia 12/1994  . Hypertension   . Hypokalemia   . Lung collapse 03/1991   Fall  . Permanent atrial fibrillation ([name])    Refused coumadin therapy   Past Surgical History:  Past Surgical History:  Procedure Laterality Date  . BACK SURGERY  11/91   with hardware fixation  . CARDIAC CATHETERIZATION  06/2004   30 % stenosis, EF normal  . ESOPHAGOGASTRODUODENOSCOPY N/A 01/06/2016   Procedure: ESOPHAGOGASTRODUODENOSCOPY (EGD);  Surgeon: Otis Brace, MD;  Location: [facility];  Service: Gastroenterology;  Laterality: N/A;  . EYE SURGERY     Retinal bubble surgery, cataract   . IR CT HEAD LTD  11/16/2017  . IR PERCUTANEOUS ART THROMBECTOMY/INFUSION INTRACRANIAL INC DIAG ANGIO  11/16/2017  . LEFT HEART CATHETERIZATION WITH CORONARY ANGIOGRAM N/A 07/18/2014   Procedure: LEFT HEART CATHETERIZATION WITH CORONARY ANGIOGRAM;  Surgeon: Jettie Booze, MD; [facility] [place], LAD mild dz, D1 80%, D2 OK, CFX system OK, RCA OK, PDA 100%, med rx  . PERMANENT PACEMAKER INSERTION N/A 07/18/2014   Procedure: PERMANENT PACEMAKER INSERTION;  Surgeon: Thompson Grayer, MD; Medtronic [place] model [phone] (serial number [id number])    . RADIOLOGY WITH ANESTHESIA N/A 11/16/2017   Procedure: RADIOLOGY WITH ANESTHESIA;  Surgeon: Luanne Bras, MD;  Location: [facility];  Service: Radiology;  Laterality: N/A;  . TEMPORARY PACEMAKER INSERTION N/A  07/18/2014   Procedure: TEMPORARY PACEMAKER INSERTION;  Surgeon: Peter M Martinique, MD;  Location: [facility] CATH LAB;  Service: Cardiovascular;  Laterality: N/A;    Assessment & Plan Clinical Impression: Dakota [name] is a 84 year old male with history of hypertension,  complete heart block status post permanent pacemaker, GERD/Schatzki's ring, PAF-refused Coumadin; who was admitted on 11/16/2017 with left-sided weakness, right gaze preference, slurred speech and mental status changes.  History taken from chart review and son. CT head reviewed, showing right basal ganglia infarct.  Per report, acute to subacute right basal ganglia infarct with concern of distal right MCA thrombus.  CTA/P head done revealing a right MCA territory penumbra with moderate to severe M3 branch irregularity.  He underwent cerebral angio with revascularization of the right MCA M2 segment by Dr. Estanislado Pandy.  Patient continued to have issues with confusion and delirium.  Repeat CT head 7/19 showed propagation of acute right MCA territory infarct involving right insula and right temporoparietal lobe.  Dense right insular MCA consistent with thromboembolism.  2D echo done showing EF of 65 to 70% with no wall abnormality, moderately dilated left atrium and no cardiac source of emboli.  Dr. Erlinda Hong felt that stroke was embolic due to A. Fib and plans to start Eliquis 5 mg p.o. twice daily on post hospital day 5 if patient stable.  Patient has continued to have issues with delirium, sleep wake cycle disruption and agitation.  Seroquel was added with without improvement therefore Depakote added additionally today.  Patient continues to be limited by left-sided weakness with right gaze preference, requires tactile and verbal cues to follow occasionally one-step command, short-term memory deficits, lethargy and oral phase dysphagia with max cues to initiate swallow.   Patient transferred to CIR on 11/21/2017 .   Patient currently requires total with mobility secondary to muscle joint tightness, decreased cardiorespiratoy endurance, impaired timing and sequencing, abnormal tone and unbalanced muscle activation, decreased visual motor skills, decreased midline orientation, decreased attention to left, left side  neglect and decreased motor planning, decreased initiation, decreased attention, decreased awareness, decreased problem solving, decreased safety awareness, decreased memory and delayed processing and decreased sitting balance, decreased standing balance, decreased postural control, hemiplegia and decreased balance strategies.  Prior to hospitalization, patient was independent  with mobility and lived with Spouse in a House home.  Home access is  Ramped entrance.  Patient will benefit from skilled PT intervention to maximize safe functional mobility, minimize fall risk and decrease caregiver burden for planned discharge home with 24 hour supervision.  Anticipate patient will benefit from follow up [facility] at discharge.  PT - End of Session Activity Tolerance: Tolerates 10 - 20 min activity with multiple rests Endurance Deficit: Yes Endurance Deficit Description: Pt fell asleep during the eval PT Assessment Rehab Potential (ACUTE/IP ONLY): Good PT Patient demonstrates impairments in the following area(s): Balance;Behavior;Endurance;Motor;Safety;Perception PT Transfers Functional Problem(s): Bed Mobility;Bed to Chair;Car;Furniture PT Locomotion Functional Problem(s): Stairs;Wheelchair Mobility;Ambulation PT Plan PT Intensity: Minimum of 1-2 x/day ,45 to 90 minutes PT Frequency: 5 out of 7 days PT Duration Estimated Length of Stay: 21-24 PT Treatment/Interventions: Ambulation/gait training;Community reintegration;DME/adaptive equipment instruction;Neuromuscular re-education;Psychosocial support;Stair training;UE/LE Strength taining/ROM;Wheelchair propulsion/positioning;Balance/vestibular training;Discharge planning;Functional electrical stimulation;Pain management;Therapeutic Activities;UE/LE Coordination activities;Cognitive remediation/compensation;Functional mobility training;Patient/family education;Splinting/orthotics;Therapeutic Exercise;Visual/perceptual remediation/compensation PT Transfers  Anticipated Outcome(s): Supervision basic and car PT Locomotion Anticipated Outcome(s): supervision w/c x 150', gait x 150' with LRAD, ramp and stairs TBD PT Recommendation Recommendations for Other Services: Neuropsych consult(cognitive deficits) Follow Up Recommendations: Home health PT Equipment Recommended: To  be determined Equipment Details: pt owns RW, per chart  Skilled Therapeutic Intervention Pt's son Laurey Arrow present for evaluation.  Pt sound asleep but eventually woke up with multimodal stimulation.  Pt pushes to hemiparetic side using RUE and RLE, in sitting and standing.  He followed 1 step commands with delay, about 75% of the time.  He did not track visually.  Pt mumbled answers to simple questions, but PT was not able to understand him.  Maxi Move used to transfer pt from bed> tilt in space w/c. Pt able to pull to stand in //, with assistance of 2 people, and take steps twice, x 3', x 8'.  Pt leans/pushes heavily to L and does not correct with cues.  He advanced his LLE without assistance.  Pt left resting in tilt in space w/c with seat belt alarm set and son Laurey Arrow in the room with him.  Pt able to point correctly to button on call bell to use if he needed help.    PT Evaluation Precautions/Restrictions Precautions Precautions: Fall Precaution Comments: right gaze, left hemiparesis, left lean /pushing Restrictions Weight Bearing Restrictions: No General   Vital SignsTherapy Vitals Temp: 98.1 F (36.7 C) Temp Source: Oral Pulse Rate: (!) 40 Resp: 19 BP: 107/75 Patient Position (if appropriate): Sitting Oxygen Therapy SpO2: 99 % O2 Device: Room Air Pain Pain Assessment Pain Scale: Faces Pain Score: 0-No pain Faces Pain Scale: No hurt Home Living/Prior Functioning Home Living Available Help at Discharge: Family;Available 24 hours/day Type of Home: House Home Access: Ramped entrance Home Layout: One level Bathroom Shower/Tub: Walk-in shower;Tub/shower unit Engineer, site Accessibility: Yes Additional Comments: pt was primary caregiver for wife who does not walk, pt would transfer her and family assists with bathing  Lives With: Spouse Prior Function Level of Independence: Independent with basic ADLs;Independent with homemaking with ambulation;Independent with gait;Independent with transfers  Able to Take Stairs?: Yes Driving: Yes Vocation: Retired Comments: very independent, 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 Pt has 4 kids who can assist Vision/Perception - difficult to assess; does not track in any direction    Cognition Overall Cognitive Status: Impaired/Different from baseline Arousal/Alertness: Lethargic Orientation Level: Oriented to person Attention: Focused Focused Attention: Appears intact Focused Attention Impairment: Verbal basic;Functional basic Sustained Attention: Impaired Sustained Attention Impairment: Verbal basic Memory: Impaired Memory Impairment: Decreased short term memory Decreased Short Term Memory: Verbal basic Awareness: Impaired Awareness Impairment: Intellectual impairment Problem Solving: Impaired Problem Solving Impairment: Verbal basic;Functional basic Executive Function: (all impaired due to lower level deficits ) Behaviors: Perseveration;Restless Safety/Judgment: Impaired Comments: left inattention  Sensation Sensation Light Touch: Appears Intact Proprioception: Not tested Coordination Coordination and Movement Description: L hemiparesis Heel Shin Test: NT Motor  Motor Motor: Hemiplegia;Abnormal tone Motor - Skilled Clinical Observations: hypertonic LLE  Mobility Bed Mobility Bed Mobility: Rolling Right;Rolling Left;Left Sidelying to Sit Rolling Right: Moderate Assistance - Patient 50-74% Rolling Left: Moderate Assistance - Patient 50-74% Left Sidelying to Sit: Maximal Assistance - Patient 25-49% Transfers Transfers: IT sales professional Transfers: 2 Helpers Transfer via Lift Equipment: Maximove(+2 to exit bed) Locomotion  Gait Ambulation: Yes Gait Assistance: Maximal Assistance - Patient 25-49%(+2 for chair follow and IV) Assistive device: Parallel bars Gait Assistance Details: Manual facilitation for weight shifting;Tactile cues for weight shifting;Tactile cues for sequencing Gait Gait: Yes Gait Pattern: Impaired Gait Pattern: Step-to pattern;Decreased step length - left;Lateral trunk lean to left;Decreased weight shift to right;Left genu recurvatum;Decreased dorsiflexion - left;Decreased trunk rotation;Trunk flexed  Stairs / Additional Locomotion Stairs: No Architect: No  Trunk/Postural Assessment  Cervical Assessment Cervical Assessment: Exceptions to WFL(decreased neck ROM) Thoracic Assessment Thoracic Assessment: Exceptions to WFL(kyphotic posture) Lumbar Assessment Lumbar Assessment: Exceptions to WFL(posterior pelvic tilt) Postural Control Postural Control: Deficits on evaluation Trunk Control: L lean in sitting and standing with minimal activity in L trunk musculature Righting Reactions: severely limited Protective Responses: severely limited  Balance Static Sitting Balance Static Sitting - Level of Assistance: 2: Max assist(pt pushes to L with R hand and foot) Static Standing Balance Static Standing - Level of Assistance: 2: Max assist(when allowed to pull up with bil hands on back of secured armchair) Extremity Assessment      RLE Assessment RLE Assessment: Within Functional Limits(sufficient for standing) General Strength Comments: at least 3+/5 grossly; unable to MMT LLE Assessment LLE Assessment: Exceptions to WFL(difficult to assess due to cognition) LLE Strength LLE Overall Strength Comments: grossly in sitting at least 3-/5 knee extension and ankle DF; unable to MMT   See Function Navigator for Current Functional Status.   Refer to Care Plan for Long  Term Goals  Recommendations for other services: Neuropsych  Discharge Criteria: Patient will be discharged from PT if patient refuses treatment 3 consecutive times without medical reason, if treatment goals not met, if there is a change in medical status, if patient makes no progress towards goals or if patient is discharged from hospital.  The above assessment, treatment plan, treatment alternatives and goals were discussed and mutually agreed upon: by patient and by family  Kiylee Thoreson 11/22/2017, 5:59 PM

## 2017-11-22 NOTE — Progress Notes (Signed)
Patient information reviewed and entered into eRehab system by Desarie Feild, RN, CRRN, PPS Coordinator.  Information including medical coding and functional independence measure will be reviewed and updated through discharge.     Per nursing patient was given "Data Collection Information Summary for Patients in Inpatient Rehabilitation Facilities with attached "Privacy Act Statement-Health Care Records" upon admission.  

## 2017-11-23 ENCOUNTER — Inpatient Hospital Stay (HOSPITAL_COMMUNITY): Payer: Medicare Other

## 2017-11-23 ENCOUNTER — Inpatient Hospital Stay (HOSPITAL_COMMUNITY): Payer: Medicare Other | Admitting: Speech Pathology

## 2017-11-23 ENCOUNTER — Inpatient Hospital Stay (HOSPITAL_COMMUNITY): Payer: Medicare Other | Admitting: Occupational Therapy

## 2017-11-23 DIAGNOSIS — D72829 Elevated white blood cell count, unspecified: Secondary | ICD-10-CM

## 2017-11-23 DIAGNOSIS — N390 Urinary tract infection, site not specified: Secondary | ICD-10-CM

## 2017-11-23 MED ORDER — POTASSIUM CHLORIDE IN NACL 20-0.9 MEQ/L-% IV SOLN
INTRAVENOUS | Status: DC
  Administered 2017-11-23 – 2017-11-24 (×2): via INTRAVENOUS
  Filled 2017-11-23: qty 1000

## 2017-11-23 MED ORDER — NITROFURANTOIN MONOHYD MACRO 100 MG PO CAPS
100.0000 mg | ORAL_CAPSULE | Freq: Two times a day (BID) | ORAL | Status: DC
  Administered 2017-11-23 – 2017-11-24 (×3): 100 mg via ORAL
  Filled 2017-11-23 (×3): qty 1

## 2017-11-23 NOTE — Progress Notes (Signed)
Day 1 of 3 Calorie Count Note  72 hour calorie count ordered.  Diet: Dysphagia 1 diet with nectar thick consistency Supplements:  Ensure Enlive po BID, each supplement provides 350 kcal and 20 grams of protein. 30 ml Prostat po BID, each supplement provides 100 kcal and 15 grams of protein.   Breakfast: 0% Lunch: 5% intake: 40 kcal, 2 grams of protein Dinner: 25% intake: 220 kcal, 7 grams of protein Supplements: 200 kcal, 30 grams of protein  Day 1 Total intake: 460 kcal (27% of minimum estimated needs)  39 grams of protein (52% of minimum estimated needs)  Estimated Nutritional Needs:  Kcal:  1700-1900 Protein:  75-85 grams Fluid:  >/= 1.7 L/day  RD and PA reports pt has been holding food in his mouth and has required suctioning due to concerns for aspiration risk, thus accuracy of calorie count may be inaccurate and pt is consume less than what is stated in the above calorie count. Pt may need Cortrak NGT for enteral nutrition to provide adequate nutrition. RD to continue with calorie count to provide quantitative information for family regarding pt's nutrition intake to support need for enteral nutrition.   Nutrition Dx:  Inadequate oral intake related to dysphagia as evidenced by meal completion < 50%; ongoing  Goal:  Pt to meet >/= 90% of their estimated nutrition needs; not met  Intervention:   Continue calorie count--- RD to follow up tomorrow with day 2 results  Continue Ensure Enlive po BID, each supplement provides 350 kcal and 20 grams of protein  Continue 30 ml Prostat po BID, each supplement provides 100 kcal and 15 grams of protein.   Corrin Parker, MS, RD, LDN Pager # [phone] After hours/ weekend pager # [phone]

## 2017-11-23 NOTE — Progress Notes (Signed)
[facility] PHYSICAL MEDICINE & REHABILITATION     PROGRESS NOTE  Subjective/Complaints:  Patient seen sitting up in bed this morning. He slept well per report and sleep chart. Son at bedside. Patient is more arousable and oriented this morning.  ROS: Unreliable due to cognition  Objective: Vital Signs: Blood pressure (!) 145/76, pulse 72, temperature 98.2 F (36.8 C), temperature source Oral, resp. rate 20, height 5\' 7"  (1.702 m), weight 81.7 kg (180 lb 1.9 oz), SpO2 97 %. Dg Chest 2 View  Result Date: 11/21/2017 CLINICAL DATA:  Leukocytosis. EXAM: CHEST - 2 VIEW COMPARISON:  Radiograph of November 18, 2017. FINDINGS: Stable cardiomegaly. Single lead left-sided pacemaker is unchanged in position. No pneumothorax or pleural effusion is noted. Both lungs are clear. The visualized skeletal structures are unremarkable. IMPRESSION: No active cardiopulmonary disease. Electronically Signed   By: Marijo Conception, M.D.   On: 11/21/2017 21:49   Recent Labs    11/21/17 0347 11/22/17 0715  WBC 18.4* 16.7*  HGB 16.1 14.9  HCT 48.0 43.5  PLT 183 198   Recent Labs    11/21/17 0347 11/22/17 0715  NA 136 137  K 3.7 3.9  CL 104 106  GLUCOSE 123* 122*  BUN 33* 27*  CREATININE 0.90 0.85  CALCIUM 9.1 8.8*   CBG (last 3)  No results for input(s): GLUCAP in the last 72 hours.  Wt Readings from Last 3 Encounters:  11/21/17 81.7 kg (180 lb 1.9 oz)  11/18/17 85.5 kg (188 lb 7.9 oz)  11/01/17 85.4 kg (188 lb 3.2 oz)    Physical Exam:  BP (!) 145/76 (BP Location: Left Arm)   Pulse 72   Temp 98.2 F (36.8 C) (Oral)   Resp 20   Ht 5\' 7"  (1.702 m)   Wt 81.7 kg (180 lb 1.9 oz)   SpO2 97%   BMI 28.21 kg/m  Constitutional: He appears well-developed. Frail male. HENT: Normocephalic and atraumatic.  Eyes: No discharge.  Cardiovascular: An irregularly irregular rhythm present. No JVD. Respiratory: Effort normal and breath sounds normal.  GI: Bowel sounds are normal.  Nondistended Musculoskeletal: No edema or tenderness in extremities.  Neurological:  Lethargic, slowed, persistent Severe dysarthria  Left neglect with left sided weakness--unable accurately assess due to inattention, however greater than/equal to3/5 Skin: Skin is warm and dry.  Multiple healing abrasions bilateral shins. Well healed laceration right elbow/forearm.   Psychiatric: Unable to assess due to cognition  Assessment/Plan: 1. Functional deficits secondary to right MCA infarct which require 3+ hours per day of interdisciplinary therapy in a comprehensive inpatient rehab setting. Physiatrist is providing close team supervision and 24 hour management of active medical problems listed below. Physiatrist and rehab team continue to assess barriers to discharge/monitor patient progress toward functional and medical goals.  Function:  Bathing Bathing position   Position: Bed  Bathing parts   Body parts bathed by helper: Right arm, Left arm, Chest, Front perineal area, Abdomen, Buttocks, Right upper leg, Left upper leg, Right lower leg, Left lower leg, Back  Bathing assist Assist Level: 2 helpers      Upper Body Dressing/Undressing Upper body dressing Upper body dressing/undressing activity did not occur: Safety/medical concerns(low level, IV line - hospital gown appropriate at this time) What is the patient wearing?: Hospital gown                Upper body assist        Lower Body Dressing/Undressing Lower body dressing   What is the  patient wearing?: Hospital Gown, Non-skid slipper socks           Non-skid slipper socks- Performed by helper: Don/doff right sock, Don/doff left sock                  Lower body assist        Toileting Toileting Toileting activity did not occur: Safety/medical concerns        Toileting assist     Transfers Chair/bed transfer   Chair/bed transfer method: Stand pivot, Other Chair/bed transfer assist level: 2  helpers Chair/bed transfer assistive device: Armrests, Mechanical lift(stand pivot from // to w/c to R) Mechanical lift: Maximove(to exit bed)   Locomotion Ambulation     Max distance: 8 Assist level: Maximal assist (Pt 25 - 49%)(+ 1 for IV and chair to follow)   Wheelchair Wheelchair activity did not occur: Safety/medical concerns(pt requires tilt in space due to lethargy and cognitive deficits) Type: Manual      Cognition Comprehension Comprehension assist level: Understands basic 50 - 74% of the time/ requires cueing 25 - 49% of the time  Expression Expression assist level: Expresses basic 25 - 49% of the time/requires cueing 50 - 75% of the time. Uses single words/gestures.  Social Interaction Social Interaction assist level: Interacts appropriately less than 25% of the time. May be withdrawn or combative.  Problem Solving Problem solving assist level: Solves basic less than 25% of the time - needs direction nearly all the time or does not effectively solve problems and may need a restraint for safety  Memory Memory assist level: Recognizes or recalls less than 25% of the time/requires cueing greater than 75% of the time    Medical Problem List and Plan: 1.  Left-sided weakness, right gaze preference, short-term memory deficits, lethargy, and dysphagia secondary to right MCA infarct.  Continue CIR 2.  DVT Prophylaxis/Anticoagulation: Pharmaceutical: Eliquis  3. Pain Management: tylenol prn.  4. Mood: LCSW to follow for evaluation  5. Neuropsych: This patient is not capable of making decisions on his own behalf. 6. Skin/Wound Care: routine pressure relief measures.  7. Fluids/Electrolytes/Nutrition: Monitor I/O.  8. Hospital psychosis/Delirium: Family reports delirium with every admission which gets better once at home. Question unmasking of dementia complicated by sleep wake disruption as well as deficits due to stroke.   D/ced am dose Seroquel.   Depakote added help with  agitation--monitor for SE.   Sleep wake chart.  9. A fib: Monitor HR bid. On coreg bid.  10. Pre-renal azotemia:   Started calorie count to monitor intake. Offer magic cup with meals.  Changed IVF to hs.   BUN/Cr/creatinine improving on 7/24 11. Leucocytosis:   WBCs 16.7 on 7/24  Labs ordered for tomorrow  Afebrile  UA essentially negative, urine culture with gram-negative rods, sensitivities pending   CXR  reviewed, relatively unremarkable per infectious process  Empiric Macrobid started on 7/25 12. HTN: Monitor BP bid. On HCTZ and Coreg.   Slightly labile, however relatively controlled on 7/25  Monitor with increased mobility 13. Prediabetes  Monitor with increased mobility 4. Hypoalbuminemia  Supplement initiated on 7/24  LOS (Days) 2 A FACE TO FACE EVALUATION WAS PERFORMED  Torry Adamczak [name] 11/23/2017 10:51 AM

## 2017-11-23 NOTE — Progress Notes (Signed)
PT instructed on use of flutter valve by RT with family and MD at bedside.

## 2017-11-23 NOTE — Progress Notes (Signed)
Physical Therapy Session Note  Patient Details  Name: Dakota [name] MRN: [id number] Date of Birth: [birth year]  Today's Date: 11/23/2017 PT Individual Time: 1000-1100 PT Individual Time Calculation (min): 60 min   Short Term Goals: Week 1:  PT Short Term Goal 1 (Week 1): pt will move supine> sit with mod assist PT Short Term Goal 2 (Week 1): pt will transfer via stand pivot 50% of trials with mod assist PT Short Term Goal 3 (Week 1): pt will perform gait with assist of 1 person mod assist, x 20'  Skilled Therapeutic Interventions/Progress Updates: pt lying in bed, drowsy but opening eyes at times. Pt with condom cath. Pt's son Laurey Arrow present and assisted PT.   Mod assist to sit up on L side of bed.  Given the choice, pt agreed he would like to sit on the toilet.  Use of Stedy +2 for toilet transfer; no results.  Use of Stedy for high sitting during card matching activity using L hand with assistance, or R hand, with mod assist for attending to L side of board for matching.  Pt able to state number and suit of card with cueing 6/9 cards.  Sit> stand in [place] with min> max assist, to use L hand with assistance to match 2 cards before sitting down, x 4.    neuromuscular re-education via multimodal cueing: to turn head to L and gaze to L intermittently during card matching activity.  Pt left resting in w/c with seat belt alarm set, and son Laurey Arrow present.     Therapy Documentation Precautions:  Precautions Precautions: Fall Precaution Comments: right gaze, left hemiparesis, left lean /pushing Restrictions Weight Bearing Restrictions: No Pain: pt denies       See Function Navigator for Current Functional Status.   Therapy/Group: Individual Therapy  Elijah Michaelis 11/23/2017, 12:18 PM

## 2017-11-23 NOTE — Care Management (Signed)
[facility] Individual Statement of Services  Patient Name:  Dakota [name]  Date:  11/23/2017  Welcome to the [facility].  Our goal is to provide you with an individualized program based on your diagnosis and situation, designed to meet your specific needs.  With this comprehensive rehabilitation program, you will be expected to participate in at least 3 hours of rehabilitation therapies Monday-Friday, with modified therapy programming on the weekends.  Your rehabilitation program will include the following services:  Physical Therapy (PT), Occupational Therapy (OT), Speech Therapy (ST), 24 hour per day rehabilitation nursing, Therapeutic Recreaction (TR), Neuropsychology, Case Management (Social Worker), Rehabilitation Medicine, Nutrition Services and Pharmacy Services  Weekly team conferences will be held on Wednesdays to discuss your progress.  Your Social Worker will talk with you frequently to get your input and to update you on team discussions.  Team conferences with you and your family in attendance may also be held.  Expected length of stay: 21-24 days   Overall anticipated outcome: minimal assistance  Depending on your progress and recovery, your program may change. Your Social Worker will coordinate services and will keep you informed of any changes. Your Social Worker's name and contact numbers are listed  below.  The following services may also be recommended but are not provided by the [facility] will be made to provide these services after discharge if needed.  Arrangements include referral to agencies that provide these services.  Your insurance has been verified to be:  Medicare and Generic commercial Your primary doctor is:  Damita Dunnings  Pertinent information will be shared with your doctor and your  insurance company.  Social Worker:  [place], [place] or ([phone]   Information discussed with and copy given to patient by: Lennart Pall, 11/23/2017, 1:41 PM

## 2017-11-23 NOTE — Discharge Instructions (Signed)
Inpatient Rehab Discharge Instructions  [facility] Discharge date and time:    Activities/Precautions/ Functional Status: Activity: no lifting, driving, or strenuous exercise till cleared by MD Diet:  Wound Care: none needed    Functional status:  ___ No restrictions     ___ Walk up steps independently _X__ 24/7 supervision/assistance   ___ Walk up steps with assistance ___ Intermittent supervision/assistance  ___ Bathe/dress independently ___ Walk with walker     _X__ Bathe/dress with assistance ___ Walk Independently    ___ Shower independently ___ Walk with assistance    ___ Shower with assistance _X__ No alcohol     ___ Return to work/school ________   Special Instruction  STROKE/TIA DISCHARGE INSTRUCTIONS SMOKING Cigarette smoking nearly doubles your risk of having a stroke & is the single most alterable risk factor  If you smoke or have smoked in the last 12 months, you are advised to quit smoking for your health.  Most of the excess cardiovascular risk related to smoking disappears within a year of stopping.  Ask you doctor about anti-smoking medications  [place] Quit Line: 1-800-QUIT NOW  Free Smoking Cessation Classes [phone]  CHOLESTEROL Know your levels; limit fat & cholesterol in your diet  Lipid Panel     Component Value Date/Time   CHOL 151 11/17/2017 0500   TRIG 61 11/17/2017 0500   HDL 37 (L) 11/17/2017 0500   CHOLHDL 4.1 11/17/2017 0500   VLDL 12 11/17/2017 0500   LDLCALC 102 (H) 11/17/2017 0500      Many patients benefit from treatment even if their cholesterol is at goal.  Goal: Total Cholesterol (CHOL) less than 160  Goal:  Triglycerides (TRIG) less than 150  Goal:  HDL greater than 40  Goal:  LDL (LDLCALC) less than 100   BLOOD PRESSURE American Stroke Association blood pressure target is less that 120/80 mm/Hg  Your discharge blood pressure is:  BP: 131/78  Monitor your blood pressure  Limit your salt and alcohol intake  Many  individuals will require more than one medication for high blood pressure  DIABETES (A1c is a blood sugar average for last 3 months) Goal HGBA1c is under 7% (HBGA1c is blood sugar average for last 3 months)  Diabetes:   Prediabetes  Lab Results  Component Value Date   HGBA1C 6.1 (H) 11/17/2017     Your HGBA1c can be lowered with medications, healthy diet, and exercise.  Check your blood sugar as directed by your physician  Call your physician if you experience unexplained or low blood sugars.  PHYSICAL ACTIVITY/REHABILITATION Goal is 30 minutes at least 4 days per week  Activity: No driving, Therapies: see above Return to work: N/A  Activity decreases your risk of heart attack and stroke and makes your heart stronger.  It helps control your weight and blood pressure; helps you relax and can improve your mood.  Participate in a regular exercise program.  Talk with your doctor about the best form of exercise for you (dancing, walking, swimming, cycling).  DIET/WEIGHT Goal is to maintain a healthy weight  Your discharge diet is:  Diet Order           DIET - DYS 1 Room service appropriate? Yes; Fluid consistency: Nectar Thick  Diet effective now          liquids Your height is:  Height: 5\' 7"  (170.2 cm) Your current weight is: Weight: 81.7 kg (180 lb 1.9 oz) Your Body Mass Index (BMI) is:  BMI (Calculated):  28.2  Following the type of diet specifically designed for you will help prevent another stroke.  Your goal weight range is:    Your goal Body Mass Index (BMI) is 19-24.  Healthy food habits can help reduce 3 risk factors for stroke:  High cholesterol, hypertension, and excess weight.  RESOURCES Stroke/Support Group:  Call [phone]   STROKE EDUCATION PROVIDED/REVIEWED AND GIVEN TO PATIENT Stroke warning signs and symptoms How to activate emergency medical system (call 911). Medications prescribed at discharge. Need for follow-up after discharge. Personal risk  factors for stroke. Pneumonia vaccine given:  Flu vaccine given:  My questions have been answered, the writing is legible, and I understand these instructions.  I will adhere to these goals & educational materials that have been provided to me after my discharge from the hospital.     My questions have been answered and I understand these instructions. I will adhere to these goals and the provided educational materials after my discharge from the hospital.  Patient/Caregiver Signature _______________________________ Date __________  Clinician Signature _______________________________________ Date __________  Please bring this form and your medication list with you to all your follow-up doctor's appointments.

## 2017-11-23 NOTE — Progress Notes (Signed)
Social Work Assessment and Plan Social Work Assessment and Plan  Patient Details  Name: Dakota [name] MRN: [id number] Date of Birth: [birth year]  Today's Date: 11/23/2017  Problem List:  Patient Active Problem List   Diagnosis Date Noted  . Acute lower UTI   . Leucocytosis   . Hypoalbuminemia due to protein-calorie malnutrition ([place])   . Prediabetes   . Prerenal azotemia   . Acute delirium 11/21/2017  . Dysphagia due to recent cerebrovascular accident 11/21/2017  . Chewing tobacco nicotine dependence 11/21/2017  . Leukocytosis 11/21/2017  . Acute ischemic right MCA stroke ([place]) 11/21/2017  . Cerebrovascular accident (CVA) due to thrombosis of right middle cerebral artery ([place])   . Acute deep vein thrombosis (DVT) of proximal vein of lower extremity ([name])   . AKI (acute kidney injury) ([place])   . Middle cerebral artery embolism, right s/p endovascular therapy 11/16/2017  . Caregiver stress 10/05/2015  . Hematuria 04/06/2015  . ARF (acute renal failure) ([place]) 07/18/2014  . Cardiogenic shock ([place]) 07/18/2014  . Atrial fibrillation with slow ventricular response ([name])   . NSTEMI (non-ST elevated myocardial infarction) ([place])   . CHB (complete heart block) - s/p MDT 1 lead PPM 07/17/2014  . Advance care planning 07/03/2014  . Sebaceous cyst 04/11/2013  . Medicare annual wellness visit, subsequent 10/28/2012  . RIB PAIN, RIGHT SIDED 01/02/2009  . CAD, NATIVE VESSEL 11/25/2008  . Unspecified vitamin D deficiency 11/24/2008  . RECENT RET DETACH PARTIAL W/SINGLE DEFECT 11/24/2008  . Essential hypertension, benign 07/21/2008  . HLD (hyperlipidemia) 05/22/2007  . Atrial fibrillation ([place]) 05/22/2007  . GERD 05/22/2007  . GASTRIC ULCER, ACUTE, HEMORRHAGE 05/22/2007  . Hyperglycemia 05/22/2007   Past Medical History:  Past Medical History:  Diagnosis Date  . Anxiety   . Back pain, chronic   . Complete heart block ([place]) 07/18/2014   Medtronic [place] model [id number] (serial number  [id number])  singe lead PPM  . Esophageal obstruction due to food impaction 2017  . GERD (gastroesophageal reflux disease) 10/2002  . Hyperlipidemia 12/1994  . Hypertension   . Hypokalemia   . Lung collapse 03/1991   Fall  . Permanent atrial fibrillation ([name])    Refused coumadin therapy   Past Surgical History:  Past Surgical History:  Procedure Laterality Date  . BACK SURGERY  11/91   with hardware fixation  . CARDIAC CATHETERIZATION  06/2004   30 % stenosis, EF normal  . ESOPHAGOGASTRODUODENOSCOPY N/A 01/06/2016   Procedure: ESOPHAGOGASTRODUODENOSCOPY (EGD);  Surgeon: Otis Brace, MD;  Location: [facility];  Service: Gastroenterology;  Laterality: N/A;  . EYE SURGERY     Retinal bubble surgery, cataract   . IR CT HEAD LTD  11/16/2017  . IR PERCUTANEOUS ART THROMBECTOMY/INFUSION INTRACRANIAL INC DIAG ANGIO  11/16/2017  . LEFT HEART CATHETERIZATION WITH CORONARY ANGIOGRAM N/A 07/18/2014   Procedure: LEFT HEART CATHETERIZATION WITH CORONARY ANGIOGRAM;  Surgeon: Jettie Booze, MD; [facility] [place], LAD mild dz, D1 80%, D2 OK, CFX system OK, RCA OK, PDA 100%, med rx  . PERMANENT PACEMAKER INSERTION N/A 07/18/2014   Procedure: PERMANENT PACEMAKER INSERTION;  Surgeon: Thompson Grayer, MD; Medtronic [place] model [id number] (serial number [id number])    . RADIOLOGY WITH ANESTHESIA N/A 11/16/2017   Procedure: RADIOLOGY WITH ANESTHESIA;  Surgeon: Luanne Bras, MD;  Location: [facility];  Service: Radiology;  Laterality: N/A;  . TEMPORARY PACEMAKER INSERTION N/A 07/18/2014   Procedure: TEMPORARY PACEMAKER INSERTION;  Surgeon: Peter M Martinique, MD;  Location: [facility] CATH LAB;  Service: Cardiovascular;  Laterality:  N/A;   Social History:  reports that he quit smoking about 64 years ago. He has a 15.00 pack-year smoking history. His smokeless tobacco use includes chew. He reports that he does not drink alcohol or use drugs.  Family / Support Systems Marital Status: Married Patient Roles: Spouse, Caregiver,  Parent Spouse/Significant Other: wife requires 24/7 care from family Children: daughter, Dakota [name] @ (H) [phone];  son, Dakota [name] @ (C) [phone];  son, Dakota [name] @ (C) [phone] and son, Dakota [name]"  Anticipated Caregiver: Family currently sharing responsibilities for care of their mother and plan to continue this for pt, too, upon d/c. Ability/Limitations of Caregiver: Dakota [name] retired;  other 3 children are working full-time and one with new job and less daytime availability.   Caregiver Availability: 24/7 Family Dynamics: Son, Dakota [name], notes that all children (and grandchildren) are very supportive and are sharing in care of pt and his wife.  They are trying to keep their parents in their home for as long as possible.  They have hired a Academic librarian for the [place].  Social History Preferred language: English Religion: Baptist Cultural Background: NA Read: Yes Write: Yes Employment Status: Retired Freight forwarder Issues: None Guardian/Conservator: None - per MD, pt is not capable of making decisions on his own behalf - defer to children.  Son, Dakota [name], notes that no one has POA of any sort.   Abuse/Neglect Abuse/Neglect Assessment Can Be Completed: Yes Physical Abuse: Denies Verbal Abuse: Denies Sexual Abuse: Denies Exploitation of patient/patient's resources: Denies Self-Neglect: Denies  Emotional Status Pt's affect, behavior adn adjustment status: Pt minimally engaged during interview as son, Dakota [name], provides majority of information.  Pt does smile and attempt to answer basic questions.  He is A&Ox 4 with me.  He appears relaxed and fatigued.  Denies any significant emotional distress, however, will monitor and refer for neuropsychology as indicated. Recent Psychosocial Issues: Pt has been providing 24/7 care for wife for 10+ yrs per son. Pyschiatric History: None Substance Abuse History: None  Patient / Family Perceptions, Expectations &  Goals Pt/Family understanding of illness & functional limitations: Pt aware he has suffered a CVA and is receiving rehab for deficits.  Son and family with good understanding of medical issues, current functional limitations/ need for CIR. Premorbid pt/family roles/activities: As noted,  pt was providing 24/7 physical assistance/ caregiving for his wife PTA.  Pt was completely independent and still driving, tending to gardens and riding tractor over his land. Anticipated changes in roles/activities/participation: Team anticipating need for 24/7 care, therefore, family will need to add in pt's care to what they are already providing for their mother. Pt/family expectations/goals: "He needs to get a lot better than this."  per son, Dakota [name], regarding readiness for d/c home.  Community Resources Express Scripts: None Premorbid Home Care/DME Agencies: None Transportation available at discharge: yes Resource referrals recommended: Neuropsychology  Discharge Planning Living Arrangements: Spouse/significant other Support Systems: Children, Other relatives Type of Residence: Private residence Insurance Resources: Multimedia programmer (specify), Chartered certified accountant Resources: Radio broadcast assistant Screen Referred: No Living Expenses: Own Money Management: Patient, Family Does the patient have any problems obtaining your medications?: No Home Management: patient with assist of family prn Patient/Family Preliminary Plans: Pt and family goal is for pt to be able to reach at least a min assistance level and hopeful they can manage care for the two of them. Social Work Anticipated Follow Up Needs: HH/OP Expected length of stay: 21-24 days  Clinical Impression Elderly gentleman here following CVA  and son at bedside to assist with assessment interview.  Pt does attempt to answer basic, personal info questions and does not appear to be in any emotional distress.  Pt is oriented x 4 with me.  Son notes  that family very eager to see pt reach a min assist level or better as they will now be providing care to both pt and their mother (who also requires 24/7 assistance.)  SW to follow for support and d/c planning needs.  Ivyanna Sibert 11/23/2017, 3:06 PM

## 2017-11-23 NOTE — Progress Notes (Signed)
Occupational Therapy Session Note  Patient Details  Name: Dakota [name] MRN: [id number] Date of Birth: October 13, 1928  Today's Date: 11/23/2017 OT Individual Time: 1102-1202 OT Individual Time Calculation (min): 60 min    Short Term Goals: Week 1:  OT Short Term Goal 1 (Week 1): Pt will be able to sit to EOB with max A of 1 in prep for self care. OT Short Term Goal 2 (Week 1): Pt will be able to maintain static sitting EOB with min A in prep for self care. OT Short Term Goal 3 (Week 1): Pt will be able to use LUE to wash abdomen and chest with min cues. OT Short Term Goal 4 (Week 1): Pt will be able to complete bed >< w/c transfer with max A of 1 with either a slide board or a squat pivot.  OT Short Term Goal 5 (Week 1): Pt will identify items in his left visual field with min cues.   Skilled Therapeutic Interventions/Progress Updates:    Pt completes bathing and dressing from the tilt in space wheelchair with pt's son present.  Max assist for sitting balance secondary to increased lean to the left without correction.  Max instructional cueing for initiation of task.  Pt attempts to use both UEs for for bathing but demonstrates frequent dropping of the washcloth with the LUE as well as decreased motor planning.  Total +2 (pt 25%)  for all sit to stand and standing when washing peri area and pulling garments over his hips.  He demonstrates increased pushing to the left as well as moderate trunk flexion and rotation to the left with standing.  Max assist for all UB dressing and for threading garments over his feet.  Pt not oriented to place or situation when asked.  Left pt in tilt in space wheelchair with safety alarm belt in place to conclude session.  Encouraged pt's son to assist pt with shaving with the electric razor and looking to the left.    Therapy Documentation Precautions:  Precautions Precautions: Fall Precaution Comments: right gaze, left hemiparesis, left lean  /pushing Restrictions Weight Bearing Restrictions: No   Vital Signs: Oxygen Therapy SpO2: 97 % O2 Device: Room Air Pain: Pain Assessment Pain Scale: Faces Pain Score: 0-No pain Faces Pain Scale: No hurt ADL:  See Function Navigator for Current Functional Status.   Therapy/Group: Individual Therapy  Gerrica Cygan OTR/L 11/23/2017, 12:45 PM

## 2017-11-23 NOTE — Progress Notes (Signed)
Patient with poor po intake--patient required a hour to consume 25% with ST yesterday. He tends to hold food in his cheek (as if its chew) and has required suctioning by staff due to concerns of aspiration risk. Son feels that he can do a better job with feeding--safety precautions reviewed. Also discussed poor intake since admission ongoing and patient may need cortak for alternative means of nutrition and to maintain adequate nutritional status/energy levels.

## 2017-11-23 NOTE — Progress Notes (Signed)
Speech Language Pathology Daily Session Note  Patient Details  Name: Dakota [name] MRN: [id number] Date of Birth: Jun 10, 1928  Today's Date: 11/23/2017 SLP Individual Time: 1240-1305; 1415-1500 SLP Individual Time Calculation (min): 25 min, 45 min   Short Term Goals: Week 1: SLP Short Term Goal 1 (Week 1): Pt will consume dys 1 textures and nectar thick liquids via teaspoon with max assist multimodal cues and minimal overt s/s of aspiration.  SLP Short Term Goal 2 (Week 1): Pt will utilize external aids to reorient to place, date, and situation with mod assist multimodal cues.  SLP Short Term Goal 3 (Week 1): Pt will sustain his attention to basic, familiar tasks for ~1 minute intervals with max assist multimodal cues.  SLP Short Term Goal 4 (Week 1): Pt will slow rate and increase vocal intensity to achieve intelligibility at the word level with max assist.   SLP Short Term Goal 5 (Week 1): Pt will locate items to the left of midline during basic familiar tasks in ~50% of opportunities with max assist multimodal cues.   Skilled Therapeutic Interventions:  Session 1:  Pt was seen for skilled ST targeting cognitive goals.  Pt was received sitting upright in tilt in space wheelchair with son at bedside.  Pt was awake, alert, and confused.  RN and pt's son report that pt had increased difficulty swallowing this morning during breakfast and required suctioning to remove boluses from the oral cavity due to holding.  During lunch meal, pt needed max cues to propel boluses and initiate a swallow due to fleeting sustained attention to POs.  Pt was able to generate a second swallow with max multimodal cues.  Despite pt's ability to comply with swallowing precautions with cuing, SLP still had to suction a significant amount of residual purees from the oral cavity.  Discussed the importance of cuing during meals to prevent oral holding in addition to oral care after meals with pt's son who verbalized  understanding.  Pt ate ~25% of meal before becoming fatigued and declining further POs.  Pt was left in wheelchair at the end of today's session with son at bedside.  Continue per current plan of care.    Session 2: Pt was seen for skilled ST targeting cognitive goals.  Pt was received upright in tilt in space wheelchair with son at bedside.  Pt perseveratively requested to use the commode to have a bowel movement.  Per RN and pt's son, pt has been constipated and attempts to have a bowel movement have thus far been unsuccessful.  SLP provided pt with nectar thick prune juice and applesauce but PO intake was minimal.  SLP facilitated the session with a basic pipe tree task to address attention to task and visual scanning to the left of midline.  Pt needed max to total assist to deconstruct a pipe tree figure provided by the SLP due to perseveration and minimal attention to task.  Pt could then put pieces of PVC pipe into a box on his left with mod-max assist verbal and visual cues.  Pt was returned to room and transferred back to bed via Maximove and +2 from NT.  Pt was left in bed with bed alarm set and son at bedside.  Continue per current plan of care.    Function:  Eating Eating   Modified Consistency Diet: Yes Eating Assist Level: Helper feeds patient           Cognition Comprehension Comprehension assist level: Understands basic 25 -  49% of the time/ requires cueing 50 - 75% of the time  Expression   Expression assist level: Expresses basis less than 25% of the time/requires cueing >75% of the time.  Social Interaction Social Interaction assist level: Interacts appropriately less than 25% of the time. May be withdrawn or combative.  Problem Solving Problem solving assist level: Solves basic less than 25% of the time - needs direction nearly all the time or does not effectively solve problems and may need a restraint for safety  Memory Memory assist level: Recognizes or recalls less than  25% of the time/requires cueing greater than 75% of the time    Pain Pain Assessment Pain Scale: 0-10 Pain Score: 0-No pain  Therapy/Group: Individual Therapy  Nakyiah Kuck, Selinda Orion 11/23/2017, 8:57 PM

## 2017-11-24 ENCOUNTER — Inpatient Hospital Stay (HOSPITAL_COMMUNITY): Payer: Medicare Other | Admitting: Physical Therapy

## 2017-11-24 ENCOUNTER — Inpatient Hospital Stay (HOSPITAL_COMMUNITY)
Admission: AD | Admit: 2017-11-24 | Discharge: 2017-12-04 | DRG: 377 | Disposition: A | Discharge status: Rehabilitation Facility | Payer: Medicare Other | Source: Ambulatory Visit | Attending: Internal Medicine | Admitting: Internal Medicine

## 2017-11-24 ENCOUNTER — Inpatient Hospital Stay (HOSPITAL_COMMUNITY): Payer: Medicare Other | Admitting: Speech Pathology

## 2017-11-24 ENCOUNTER — Inpatient Hospital Stay (HOSPITAL_COMMUNITY): Payer: Medicare Other | Admitting: Occupational Therapy

## 2017-11-24 DIAGNOSIS — Z79899 Other long term (current) drug therapy: Secondary | ICD-10-CM | POA: Diagnosis not present

## 2017-11-24 DIAGNOSIS — K269 Duodenal ulcer, unspecified as acute or chronic, without hemorrhage or perforation: Secondary | ICD-10-CM | POA: Diagnosis not present

## 2017-11-24 DIAGNOSIS — R414 Neurologic neglect syndrome: Secondary | ICD-10-CM | POA: Diagnosis present

## 2017-11-24 DIAGNOSIS — N390 Urinary tract infection, site not specified: Secondary | ICD-10-CM | POA: Diagnosis present

## 2017-11-24 DIAGNOSIS — D72829 Elevated white blood cell count, unspecified: Secondary | ICD-10-CM | POA: Diagnosis not present

## 2017-11-24 DIAGNOSIS — I69319 Unspecified symptoms and signs involving cognitive functions following cerebral infarction: Secondary | ICD-10-CM | POA: Diagnosis not present

## 2017-11-24 DIAGNOSIS — K209 Esophagitis, unspecified: Secondary | ICD-10-CM | POA: Diagnosis present

## 2017-11-24 DIAGNOSIS — J9690 Respiratory failure, unspecified, unspecified whether with hypoxia or hypercapnia: Secondary | ICD-10-CM

## 2017-11-24 DIAGNOSIS — K5900 Constipation, unspecified: Secondary | ICD-10-CM | POA: Diagnosis present

## 2017-11-24 DIAGNOSIS — K920 Hematemesis: Secondary | ICD-10-CM | POA: Diagnosis not present

## 2017-11-24 DIAGNOSIS — B9689 Other specified bacterial agents as the cause of diseases classified elsewhere: Secondary | ICD-10-CM | POA: Diagnosis present

## 2017-11-24 DIAGNOSIS — Z7901 Long term (current) use of anticoagulants: Secondary | ICD-10-CM

## 2017-11-24 DIAGNOSIS — I69322 Dysarthria following cerebral infarction: Secondary | ICD-10-CM | POA: Diagnosis not present

## 2017-11-24 DIAGNOSIS — K210 Gastro-esophageal reflux disease with esophagitis: Secondary | ICD-10-CM | POA: Diagnosis present

## 2017-11-24 DIAGNOSIS — I69312 Visuospatial deficit and spatial neglect following cerebral infarction: Secondary | ICD-10-CM

## 2017-11-24 DIAGNOSIS — H9190 Unspecified hearing loss, unspecified ear: Secondary | ICD-10-CM | POA: Diagnosis present

## 2017-11-24 DIAGNOSIS — I10 Essential (primary) hypertension: Secondary | ICD-10-CM | POA: Diagnosis present

## 2017-11-24 DIAGNOSIS — I442 Atrioventricular block, complete: Secondary | ICD-10-CM | POA: Diagnosis present

## 2017-11-24 DIAGNOSIS — R571 Hypovolemic shock: Secondary | ICD-10-CM | POA: Diagnosis not present

## 2017-11-24 DIAGNOSIS — I2510 Atherosclerotic heart disease of native coronary artery without angina pectoris: Secondary | ICD-10-CM | POA: Diagnosis present

## 2017-11-24 DIAGNOSIS — Z950 Presence of cardiac pacemaker: Secondary | ICD-10-CM | POA: Diagnosis not present

## 2017-11-24 DIAGNOSIS — K264 Chronic or unspecified duodenal ulcer with hemorrhage: Secondary | ICD-10-CM | POA: Diagnosis present

## 2017-11-24 DIAGNOSIS — I63311 Cerebral infarction due to thrombosis of right middle cerebral artery: Secondary | ICD-10-CM | POA: Diagnosis present

## 2017-11-24 DIAGNOSIS — K2970 Gastritis, unspecified, without bleeding: Secondary | ICD-10-CM | POA: Diagnosis not present

## 2017-11-24 DIAGNOSIS — R0989 Other specified symptoms and signs involving the circulatory and respiratory systems: Secondary | ICD-10-CM | POA: Diagnosis not present

## 2017-11-24 DIAGNOSIS — Z880 Allergy status to penicillin: Secondary | ICD-10-CM | POA: Diagnosis not present

## 2017-11-24 DIAGNOSIS — I69154 Hemiplegia and hemiparesis following nontraumatic intracerebral hemorrhage affecting left non-dominant side: Secondary | ICD-10-CM | POA: Diagnosis not present

## 2017-11-24 DIAGNOSIS — K922 Gastrointestinal hemorrhage, unspecified: Secondary | ICD-10-CM

## 2017-11-24 DIAGNOSIS — I480 Paroxysmal atrial fibrillation: Secondary | ICD-10-CM | POA: Diagnosis present

## 2017-11-24 DIAGNOSIS — Z8249 Family history of ischemic heart disease and other diseases of the circulatory system: Secondary | ICD-10-CM | POA: Diagnosis not present

## 2017-11-24 DIAGNOSIS — I639 Cerebral infarction, unspecified: Secondary | ICD-10-CM | POA: Diagnosis not present

## 2017-11-24 DIAGNOSIS — I69321 Dysphasia following cerebral infarction: Secondary | ICD-10-CM | POA: Diagnosis not present

## 2017-11-24 DIAGNOSIS — E785 Hyperlipidemia, unspecified: Secondary | ICD-10-CM | POA: Diagnosis present

## 2017-11-24 DIAGNOSIS — K219 Gastro-esophageal reflux disease without esophagitis: Secondary | ICD-10-CM | POA: Diagnosis present

## 2017-11-24 DIAGNOSIS — I959 Hypotension, unspecified: Secondary | ICD-10-CM | POA: Diagnosis not present

## 2017-11-24 DIAGNOSIS — K5901 Slow transit constipation: Secondary | ICD-10-CM | POA: Diagnosis not present

## 2017-11-24 DIAGNOSIS — E8809 Other disorders of plasma-protein metabolism, not elsewhere classified: Secondary | ICD-10-CM | POA: Diagnosis present

## 2017-11-24 DIAGNOSIS — I69391 Dysphagia following cerebral infarction: Secondary | ICD-10-CM | POA: Diagnosis not present

## 2017-11-24 DIAGNOSIS — F05 Delirium due to known physiological condition: Secondary | ICD-10-CM | POA: Diagnosis present

## 2017-11-24 DIAGNOSIS — D62 Acute posthemorrhagic anemia: Secondary | ICD-10-CM | POA: Diagnosis present

## 2017-11-24 DIAGNOSIS — I482 Chronic atrial fibrillation: Secondary | ICD-10-CM | POA: Diagnosis not present

## 2017-11-24 DIAGNOSIS — Z720 Tobacco use: Secondary | ICD-10-CM | POA: Diagnosis not present

## 2017-11-24 DIAGNOSIS — R410 Disorientation, unspecified: Secondary | ICD-10-CM | POA: Diagnosis not present

## 2017-11-24 DIAGNOSIS — Z95828 Presence of other vascular implants and grafts: Secondary | ICD-10-CM

## 2017-11-24 DIAGNOSIS — K222 Esophageal obstruction: Secondary | ICD-10-CM | POA: Diagnosis present

## 2017-11-24 DIAGNOSIS — R578 Other shock: Secondary | ICD-10-CM | POA: Diagnosis present

## 2017-11-24 DIAGNOSIS — K2971 Gastritis, unspecified, with bleeding: Secondary | ICD-10-CM | POA: Diagnosis present

## 2017-11-24 DIAGNOSIS — I63511 Cerebral infarction due to unspecified occlusion or stenosis of right middle cerebral artery: Secondary | ICD-10-CM | POA: Diagnosis not present

## 2017-11-24 DIAGNOSIS — R2689 Other abnormalities of gait and mobility: Secondary | ICD-10-CM | POA: Diagnosis not present

## 2017-11-24 DIAGNOSIS — F419 Anxiety disorder, unspecified: Secondary | ICD-10-CM | POA: Diagnosis present

## 2017-11-24 DIAGNOSIS — G8114 Spastic hemiplegia affecting left nondominant side: Secondary | ICD-10-CM | POA: Diagnosis not present

## 2017-11-24 DIAGNOSIS — Z8719 Personal history of other diseases of the digestive system: Secondary | ICD-10-CM | POA: Diagnosis not present

## 2017-11-24 DIAGNOSIS — R1312 Dysphagia, oropharyngeal phase: Secondary | ICD-10-CM | POA: Diagnosis present

## 2017-11-24 DIAGNOSIS — F17220 Nicotine dependence, chewing tobacco, uncomplicated: Secondary | ICD-10-CM | POA: Diagnosis present

## 2017-11-24 DIAGNOSIS — I4891 Unspecified atrial fibrillation: Secondary | ICD-10-CM | POA: Diagnosis present

## 2017-11-24 DIAGNOSIS — R350 Frequency of micturition: Secondary | ICD-10-CM | POA: Diagnosis present

## 2017-11-24 DIAGNOSIS — G479 Sleep disorder, unspecified: Secondary | ICD-10-CM | POA: Diagnosis not present

## 2017-11-24 DIAGNOSIS — Z452 Encounter for adjustment and management of vascular access device: Secondary | ICD-10-CM | POA: Diagnosis not present

## 2017-11-24 DIAGNOSIS — R7303 Prediabetes: Secondary | ICD-10-CM | POA: Diagnosis not present

## 2017-11-24 DIAGNOSIS — Z888 Allergy status to other drugs, medicaments and biological substances status: Secondary | ICD-10-CM | POA: Diagnosis not present

## 2017-11-24 DIAGNOSIS — Z8042 Family history of malignant neoplasm of prostate: Secondary | ICD-10-CM

## 2017-11-24 DIAGNOSIS — E46 Unspecified protein-calorie malnutrition: Secondary | ICD-10-CM | POA: Diagnosis not present

## 2017-11-24 DIAGNOSIS — R7989 Other specified abnormal findings of blood chemistry: Secondary | ICD-10-CM | POA: Diagnosis not present

## 2017-11-24 DIAGNOSIS — K3189 Other diseases of stomach and duodenum: Secondary | ICD-10-CM | POA: Diagnosis not present

## 2017-11-24 LAB — CBC WITH DIFFERENTIAL/PLATELET
Abs Immature Granulocytes: 0.3 10*3/uL — ABNORMAL HIGH (ref 0.0–0.1)
Basophils Absolute: 0.1 10*3/uL (ref 0.0–0.1)
Basophils Relative: 0 %
Eosinophils Absolute: 0.5 10*3/uL (ref 0.0–0.7)
Eosinophils Relative: 4 %
HEMATOCRIT: 42.5 % (ref 39.0–52.0)
HEMOGLOBIN: 13.9 g/dL (ref 13.0–17.0)
IMMATURE GRANULOCYTES: 2 %
LYMPHS ABS: 2.7 10*3/uL (ref 0.7–4.0)
LYMPHS PCT: 18 %
MCH: 29.7 pg (ref 26.0–34.0)
MCHC: 32.7 g/dL (ref 30.0–36.0)
MCV: 90.8 fL (ref 78.0–100.0)
MONOS PCT: 12 %
Monocytes Absolute: 1.8 10*3/uL — ABNORMAL HIGH (ref 0.1–1.0)
NEUTROS PCT: 64 %
Neutro Abs: 9.8 10*3/uL — ABNORMAL HIGH (ref 1.7–7.7)
Platelets: 238 10*3/uL (ref 150–400)
RBC: 4.68 MIL/uL (ref 4.22–5.81)
RDW: 12.8 % (ref 11.5–15.5)
WBC: 15.2 10*3/uL — AB (ref 4.0–10.5)

## 2017-11-24 LAB — CBC
HCT: 35 % — ABNORMAL LOW (ref 39.0–52.0)
HCT: 27.8 % — ABNORMAL LOW (ref 39.0–52.0)
HCT: 25.5 % — ABNORMAL LOW (ref 39.0–52.0)
Hemoglobin: 11.6 g/dL — ABNORMAL LOW (ref 13.0–17.0)
Hemoglobin: 9.3 g/dL — ABNORMAL LOW (ref 13.0–17.0)
Hemoglobin: 8.4 g/dL — ABNORMAL LOW (ref 13.0–17.0)
MCH: 30 pg (ref 26.0–34.0)
MCH: 30.6 pg (ref 26.0–34.0)
MCH: 29.9 pg (ref 26.0–34.0)
MCHC: 33.1 g/dL (ref 30.0–36.0)
MCHC: 33.5 g/dL (ref 30.0–36.0)
MCHC: 32.9 g/dL (ref 30.0–36.0)
MCV: 90.4 fL (ref 78.0–100.0)
MCV: 91.4 fL (ref 78.0–100.0)
MCV: 90.7 fL (ref 78.0–100.0)
PLATELETS: 245 10*3/uL (ref 150–400)
PLATELETS: 239 10*3/uL (ref 150–400)
Platelets: 237 10*3/uL (ref 150–400)
RBC: 3.87 MIL/uL — ABNORMAL LOW (ref 4.22–5.81)
RBC: 3.04 MIL/uL — AB (ref 4.22–5.81)
RBC: 2.81 MIL/uL — ABNORMAL LOW (ref 4.22–5.81)
RDW: 12.9 % (ref 11.5–15.5)
RDW: 12.9 % (ref 11.5–15.5)
RDW: 13 % (ref 11.5–15.5)
WBC: 22 10*3/uL — AB (ref 4.0–10.5)
WBC: 23.7 10*3/uL — AB (ref 4.0–10.5)
WBC: 26.3 10*3/uL — ABNORMAL HIGH (ref 4.0–10.5)

## 2017-11-24 LAB — URINE CULTURE: Culture: >=100000 — AB

## 2017-11-24 LAB — BASIC METABOLIC PANEL
ANION GAP: 10 (ref 5–15)
BUN: 54 mg/dL — ABNORMAL HIGH (ref 8–23)
CO2: 22 mmol/L (ref 22–32)
Calcium: 8.5 mg/dL — ABNORMAL LOW (ref 8.9–10.3)
Chloride: 106 mmol/L (ref 98–111)
Creatinine, Ser: 1 mg/dL (ref 0.61–1.24)
GFR calc Af Amer: >60 mL/min (ref >60)
GLUCOSE: 124 mg/dL — AB (ref 70–99)
POTASSIUM: 4.8 mmol/L (ref 3.5–5.1)
Sodium: 138 mmol/L (ref 135–145)

## 2017-11-24 LAB — ABO/RH: ABO/RH(D): A POS

## 2017-11-24 LAB — PREPARE RBC (CROSSMATCH)

## 2017-11-24 LAB — OCCULT BLOOD X 1 CARD TO LAB, STOOL: Fecal Occult Bld: POSITIVE — AB

## 2017-11-24 MED ORDER — ALPRAZOLAM 0.25 MG PO TABS
0.2500 mg | ORAL_TABLET | Freq: Two times a day (BID) | ORAL | Status: DC | PRN
  Administered 2017-11-25 – 2017-11-27 (×3): 0.25 mg via ORAL
  Filled 2017-11-24 (×4): qty 1

## 2017-11-24 MED ORDER — SODIUM CHLORIDE 0.9 % IV SOLN
INTRAVENOUS | Status: DC
  Administered 2017-11-24 (×2): via INTRAVENOUS

## 2017-11-24 MED ORDER — PRAVASTATIN SODIUM 20 MG PO TABS
20.0000 mg | ORAL_TABLET | Freq: Every day | ORAL | Status: DC
  Administered 2017-11-27: 20 mg via ORAL
  Filled 2017-11-24 (×2): qty 1

## 2017-11-24 MED ORDER — FAMOTIDINE 20 MG PO TABS
20.0000 mg | ORAL_TABLET | Freq: Two times a day (BID) | ORAL | Status: DC
  Administered 2017-11-25 – 2017-11-27 (×4): 20 mg via ORAL
  Filled 2017-11-24 (×5): qty 1

## 2017-11-24 MED ORDER — DIVALPROEX SODIUM 125 MG PO CSDR
250.0000 mg | DELAYED_RELEASE_CAPSULE | Freq: Three times a day (TID) | ORAL | Status: DC
  Administered 2017-11-25 – 2017-11-28 (×6): 250 mg via ORAL
  Filled 2017-11-24 (×7): qty 2

## 2017-11-24 MED ORDER — SODIUM CHLORIDE 0.9 % IV BOLUS
1000.0000 mL | Freq: Once | INTRAVENOUS | Status: AC
  Administered 2017-11-24: 1000 mL via INTRAVENOUS

## 2017-11-24 MED ORDER — SULFAMETHOXAZOLE-TRIMETHOPRIM 800-160 MG PO TABS
1.0000 | ORAL_TABLET | Freq: Two times a day (BID) | ORAL | Status: DC
  Administered 2017-11-24: 1 via ORAL
  Filled 2017-11-24 (×2): qty 1

## 2017-11-24 MED ORDER — NICOTINE 7 MG/24HR TD PT24
7.0000 mg | MEDICATED_PATCH | Freq: Every day | TRANSDERMAL | Status: DC
  Administered 2017-11-24 – 2017-11-27 (×4): 7 mg via TRANSDERMAL
  Filled 2017-11-24 (×4): qty 1

## 2017-11-24 MED ORDER — PANTOPRAZOLE SODIUM 40 MG IV SOLR
40.0000 mg | Freq: Two times a day (BID) | INTRAVENOUS | Status: DC
  Administered 2017-11-28: 40 mg via INTRAVENOUS
  Filled 2017-11-24: qty 40

## 2017-11-24 MED ORDER — SODIUM CHLORIDE 0.9 % IV SOLN
80.0000 mg | Freq: Once | INTRAVENOUS | Status: AC
  Administered 2017-11-24: 80 mg via INTRAVENOUS
  Filled 2017-11-24: qty 80

## 2017-11-24 MED ORDER — SODIUM CHLORIDE 0.9 % IV SOLN
8.0000 mg/h | INTRAVENOUS | Status: AC
  Administered 2017-11-24 – 2017-11-27 (×7): 8 mg/h via INTRAVENOUS
  Filled 2017-11-24 (×10): qty 80

## 2017-11-24 MED ORDER — QUETIAPINE FUMARATE 25 MG PO TABS
25.0000 mg | ORAL_TABLET | Freq: Every day | ORAL | Status: DC
  Administered 2017-11-25 – 2017-11-27 (×3): 25 mg via ORAL
  Filled 2017-11-24 (×3): qty 1

## 2017-11-24 MED ORDER — SODIUM CHLORIDE 0.9% IV SOLUTION
Freq: Once | INTRAVENOUS | Status: AC
  Administered 2017-11-26: 05:00:00 via INTRAVENOUS

## 2017-11-24 MED ORDER — TRAMADOL HCL 50 MG PO TABS: 50.0000 mg | ORAL_TABLET | Freq: Four times a day (QID) | ORAL | Status: DC | PRN

## 2017-11-24 MED ORDER — QUETIAPINE FUMARATE 25 MG PO TABS
12.5000 mg | ORAL_TABLET | Freq: Every day | ORAL | Status: DC
  Administered 2017-11-25: 12.5 mg via ORAL
  Filled 2017-11-24: qty 1

## 2017-11-24 MED ORDER — POTASSIUM CHLORIDE IN NACL 20-0.9 MEQ/L-% IV SOLN
INTRAVENOUS | Status: DC
  Administered 2017-11-24 – 2017-11-25 (×2): via INTRAVENOUS
  Filled 2017-11-24 (×3): qty 1000

## 2017-11-24 MED ORDER — SODIUM CHLORIDE 0.9 % IV BOLUS
500.0000 mL | Freq: Once | INTRAVENOUS | Status: AC
  Administered 2017-11-24: 500 mL via INTRAVENOUS

## 2017-11-24 MED ORDER — SODIUM CHLORIDE 0.9% FLUSH
3.0000 mL | Freq: Two times a day (BID) | INTRAVENOUS | Status: DC
  Administered 2017-11-27 – 2017-12-04 (×11): 3 mL via INTRAVENOUS

## 2017-11-24 MED ORDER — RESOURCE THICKENUP CLEAR PO POWD
1.0000 | ORAL | Status: DC | PRN
  Filled 2017-11-24: qty 125

## 2017-11-24 MED ORDER — QUETIAPINE FUMARATE 25 MG PO TABS: 12.5000 mg | ORAL_TABLET | Freq: Every day | ORAL | Status: DC

## 2017-11-24 NOTE — Progress Notes (Signed)
Patient"s BP dropped to 81/41. Patient had one large maroon stool. NP notified. Orders received, continue to observe the patient.

## 2017-11-24 NOTE — H&P (Signed)
History and Physical    Dakota [name] IEP:[id number] DOB: 11-09-1928 DOA: 11/24/2017  PCP: Tonia Ghent, MD   Patient coming from: Inpatient rehab  I have personally briefly reviewed patient's old medical records in [place]  Chief Complaint: Hypotensive shock due to acute GI bleeding  HPI: Dakota [name] is a 84 y.o. 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  He was admitted with right gaze preference slurred speech and mental status changes on 718.  CT of the head showed a right basal ganglier infarct.  He was out of the window for TPA and underwent cerebral angiogram with revascularization of the right MCA M2 segment by Dr. Dimas [place].  He continued to have issues with confusion and delirium.  He has required Seroquel while in the rehab hospital.  Repeat CT head on 719 showed a propagation of the acute right MCA territory infarct involving the insula and the right temporal lobe.  The echocardiogram showed ejection fraction of 65 to 70%.  He had no wall motion abnormality but mildly Lee dilated left atrium and no cardiac source of emboli.  Dr.Xu doubt that stroke was embolic due to atrial fibrillation and Eliquis was begun on post intervention day #5.  Patient continued to have issues with delirium despite Seroquel and therefore Depakote was added on the 23rd.  Patient is limited by left-sided weakness with right gaze preference.  He requires tactile and verbal cues to follow occasional one-step commands.  He has significant short-term memory deficits, he has lethargy and oral phase dysphasia with max cues to initiate swallow.  He was doing rehab on: Inpatient rehab unit when he was noted to have an episode of melena.  Stool was maroon in color he has had previous episodes  of black tarry stools this morning.  Positive.  She was pale and lethargic upon arrival of the PA and myself.  Complaining of lower abdominal pain.  His blood pressure was found to be hypotensive at 83/64.  Rapid response was initiated and fluid bolus was ordered.  Since vital signs have improved with initiation of IV fluids and he kept down unit.   Review of Systems: Level 5 caveat due to lethargy and hypotension   Past Medical History:  Diagnosis Date  . Anxiety   . Back pain, chronic   . Complete heart block ([employer]) 07/18/2014   Medtronic [place] model [id number] (serial number [id number])  singe lead PPM  . Esophageal obstruction due to food impaction 2017  . GERD (gastroesophageal reflux disease) 10/2002  . Hyperlipidemia 12/1994  . Hypertension   . Hypokalemia   . Lung collapse 03/1991   Fall  . Permanent atrial fibrillation ([name])    Refused coumadin therapy    Past Surgical History:  Procedure Laterality Date  . BACK SURGERY  11/91   with hardware fixation  . CARDIAC CATHETERIZATION  06/2004   30 % stenosis, EF normal  . ESOPHAGOGASTRODUODENOSCOPY N/A 01/06/2016   Procedure: ESOPHAGOGASTRODUODENOSCOPY (EGD);  Surgeon: Otis Brace, MD;  Location: [facility];  Service: Gastroenterology;  Laterality: N/A;  . EYE SURGERY     Retinal bubble surgery, cataract   . IR CT HEAD LTD  11/16/2017  . IR PERCUTANEOUS ART THROMBECTOMY/INFUSION INTRACRANIAL INC DIAG ANGIO  11/16/2017  . LEFT HEART CATHETERIZATION WITH CORONARY ANGIOGRAM N/A 07/18/2014   Procedure: LEFT  HEART CATHETERIZATION WITH CORONARY ANGIOGRAM;  Surgeon: Jettie Booze, MD; [facility] [place], LAD mild dz, D1 80%, D2 OK, CFX system OK, RCA OK, PDA 100%, med rx  . PERMANENT PACEMAKER INSERTION N/A 07/18/2014   Procedure: PERMANENT PACEMAKER INSERTION;  Surgeon: Thompson Grayer, MD; Medtronic [place] model [phone] (serial number [id number])    . RADIOLOGY WITH ANESTHESIA N/A 11/16/2017   Procedure: RADIOLOGY WITH ANESTHESIA;  Surgeon:  Luanne Bras, MD;  Location: [place];  Service: Radiology;  Laterality: N/A;  . TEMPORARY PACEMAKER INSERTION N/A 07/18/2014   Procedure: TEMPORARY PACEMAKER INSERTION;  Surgeon: Peter M Martinique, MD;  Location: [facility] CATH LAB;  Service: Cardiovascular;  Laterality: N/A;    Social History   Social History Narrative   Retired from [employer], building/construction   Married 1953   4 kids   Caring for his wife at home- source of anxiety for patient     reports that he quit smoking about 64 years ago. He has a 15.00 pack-year smoking history. His smokeless tobacco use includes chew. He reports that he does not drink alcohol or use drugs.  Allergies  Allergen Reactions  . Warfarin And Related Other (See Comments)    Lost a lot of weight and also experienced numbness  . Atorvastatin Other (See Comments)    REACTION: MUSCLE PAIN  . Ezetimibe Other (See Comments)    REACTION: MUSCLE PAIN  . Diazepam Other (See Comments)    UNKNOWN, per pt  . Penicillins Other (See Comments)    UNKNOWN, per pt    Family History  Problem Relation Age of Onset  . Pneumonia Mother   . Hip fracture Mother   . Cancer Father        splenic  . Heart disease Sister        Pacer placed  . Cancer Brother        throat and lung  . Cancer Brother        prostate, age 47  . Prostate cancer Brother   . Heart disease Son   . Stroke Neg Hx   . Colon cancer Neg Hx      Prior to Admission medications   Medication Sig Start Date End Date Taking? Authorizing Provider  ALPRAZolam (XANAX) 0.5 MG tablet TAKE ONE-HALF TABLET BY MOUTH TWICE DAILY AS NEEDED FOR ANXIETY Patient taking differently: Take 0.25 mg by mouth 2 (two) times daily as needed for anxiety. TAKE ONE-HALF TABLET BY MOUTH TWICE DAILY AS NEEDED FOR ANXIETY 10/14/17   Tonia Ghent, MD  apixaban (ELIQUIS) 5 MG TABS tablet Take 1 tablet (5 mg total) by mouth 2 (two) times daily. 11/21/17   Donzetta Starch, NP  calcium carbonate (TUMS - DOSED IN  MG ELEMENTAL CALCIUM) 500 MG chewable tablet Chew 1 tablet by mouth daily as needed for indigestion.     [provider]  carvedilol (COREG) 25 MG tablet TAKE 1 TABLET BY MOUTH TWICE DAILY WITH  A  MEAL. 08/03/17   Josue Hector, MD  divalproex (DEPAKOTE SPRINKLE) 125 MG capsule Take 2 capsules (250 mg total) by mouth every 8 (eight) hours. 11/21/17   Donzetta Starch, NP  docusate sodium (COLACE) 100 MG capsule Take 1 capsule (100 mg total) by mouth daily as needed for mild constipation. 01/23/17   Tonia Ghent, MD  hydrochlorothiazide (MICROZIDE) 12.5 MG capsule TAKE ONE CAPSULE BY MOUTH ONCE DAILY 07/27/17   Josue Hector, MD  Magnesium Hydroxide (MILK OF MAGNESIA PO) Take 1  tablet by mouth daily as needed.    [provider]  Maltodextrin-Xanthan Gum (RESOURCE THICKENUP CLEAR) POWD Take 120 g by mouth as needed (nectar thick liquid consistency). 11/21/17   Donzetta Starch, NP  nicotine (NICODERM CQ - DOSED IN MG/24 HR) 7 mg/24hr patch Place 1 patch (7 mg total) onto the skin daily. 11/22/17   Donzetta Starch, NP  nitroGLYCERIN (NITROSTAT) 0.4 MG SL tablet Place 1 tablet (0.4 mg total) under the tongue every 5 (five) minutes as needed for chest pain. 01/07/15   Josue Hector, MD  pantoprazole (PROTONIX) 40 MG tablet Take 1 tablet (40 mg total) by mouth daily. 01/06/16   Domenic Moras, PA-C  pravastatin (PRAVACHOL) 20 MG tablet Take 1 tablet (20 mg total) by mouth daily at 6 PM. 11/21/17   Donzetta Starch, NP  QUEtiapine (SEROQUEL) 25 MG tablet Take 0.5 tablets (12.5 mg total) by mouth daily. 11/22/17   Donzetta Starch, NP  QUEtiapine (SEROQUEL) 25 MG tablet Take 1 tablet (25 mg total) by mouth at bedtime. 11/21/17   Donzetta Starch, NP  ranitidine (ZANTAC) 150 MG tablet TAKE 1 TABLET BY MOUTH ONCE DAILY 07/27/17   Josue Hector, MD    Physical Exam: No data found. Blood pressure 93/53, pulse 88 respirations 18 O2 sats 94% on room air  constitutional: NAD, calm, comfortable Eyes: PERRL,  lids and conjunctivae normal ENMT: Mucous membranes are moist. Posterior pharynx clear of any exudate or lesions.Normal dentition.  Neck: normal, supple, no masses, no thyromegaly Respiratory: clear to auscultation bilaterally, no wheezing, no crackles. Normal respiratory effort. No accessory muscle use.  Cardiovascular: Regular rate and rhythm, no murmurs / rubs / gallops. No extremity edema. 2+ pedal pulses. No carotid bruits.  Abdomen: no tenderness, no masses palpated. No hepatosplenomegaly. Bowel sounds positive.  Musculoskeletal: no clubbing / cyanosis. No joint deformity upper and lower extremities. Good ROM, no contractures. Normal muscle tone.  Skin: no rashes, lesions, ulcers. No induration areas very pale Neurologic: Large, weak, answering some questions with one-word responses. Psychiatric: Able to answer questions very well.   Labs on Admission: I have personally reviewed following labs and imaging studies  CBC: Recent Labs  Lab 11/20/17 0439 11/21/17 0347 11/22/17 0715 11/24/17 0436 11/24/17 1532  WBC 16.9* 18.4* 16.7* 15.2* 22.0*  NEUTROABS  --   --  11.3* 9.8*  --   HGB 16.8 16.1 14.9 13.9 11.6*  HCT 48.8 48.0 43.5 42.5 35.0*  MCV 87.0 88.4 88.1 90.8 90.4  PLT 179 183 198 238 440   Basic Metabolic Panel: Recent Labs  Lab 11/19/17 0251 11/20/17 0439 11/21/17 0347 11/22/17 0715 11/24/17 1532  NA 136 134* 136 137 138  K 4.0 4.0 3.7 3.9 4.8  CL 106 103 104 106 106  CO2 24 24 23 24 22   GLUCOSE 108* 129* 123* 122* 124*  BUN 25* 26* 33* 27* 54*  CREATININE 0.91 0.92 0.90 0.85 1.00  CALCIUM 8.8* 9.1 9.1 8.8* 8.5*   Liver Function Tests: Recent Labs  Lab 11/22/17 0715  AST 24  ALT 31  ALKPHOS 49  BILITOT 1.4*  PROT 6.2*  ALBUMIN 2.6*   Urine analysis:    Component Value Date/Time   COLORURINE YELLOW 11/21/2017 1810   APPEARANCEUR CLEAR 11/21/2017 1810   LABSPEC 1.017 11/21/2017 1810   PHURINE 5.0 11/21/2017 1810   GLUCOSEU NEGATIVE 11/21/2017  1810   HGBUR LARGE (A) 11/21/2017 1810   BILIRUBINUR NEGATIVE 11/21/2017 1810   KETONESUR NEGATIVE  11/21/2017 1810   PROTEINUR 30 (A) 11/21/2017 1810   UROBILINOGEN 1.0 12/29/2014 0816   NITRITE NEGATIVE 11/21/2017 1810   LEUKOCYTESUR NEGATIVE 11/21/2017 1810    Radiological Exams on Admission: No results found.    Assessment/Plan Principal Problem:   Hypovolemic shock ([name]) Active Problems:   GIB (gastrointestinal bleeding)   Essential hypertension, benign   CAD, NATIVE VESSEL   Atrial fibrillation ([name])   CHB (complete heart block) - s/p MDT 1 lead PPM   Cerebrovascular accident (CVA) due to thrombosis of right middle cerebral artery ([place])   HLD (hyperlipidemia)   1.  Hypovolemic shock: Patient symptomatic with low blood pressures and having some response to IV fluid bolus.  Suspect due to GI blood losses.  Previous to this blood pressures were acceptable.  2.  GI bleeding: Patient with melanotic stools Dr. Oletta Lamas from [place] has been consulted and will see the patient.  We will start the patient on a Protonix drip, will check H&H's every 6 hours, will keep the patient n.p.o. except for very few medications.  Patient will likely require endoscopy.  3.  Essential hypertension benign: Patient's blood pressures are currently very low we will hold antihypertensives.  Monitor blood pressures restart when more stable.  4.  CAD of native vessel: Noted continue medications but will not use any aspirin or anticoagulants.  5.  Atrial fibrillation: Patient started on Eliquis after his stroke for his atrial fibrillation as there was thinking was that he had a stroke due to his atrial fibrillation.  At this point will discontinue Eliquis.  Decision regarding restarting to be determined after GI eval complete.  6.  Complete heart block status post MDT 1-lead permanent pacemaker: Continue current plan no need for rate control medications at this point.  7.  Cerebrovascular accident due  to thrombosis of right middle cerebral artery: Noted initially on the 18th with worsening of symptoms on the 19th.  Patient was not a candidate for TPA but did undergo thrombectomy.  8.  Hyperlipidemia continue home medication.    DVT prophylaxis: SCDs Code Status: Full code Family Communication: Spoke with patient's son who was present at the time of admission. Disposition Plan: Likely 48 hours back to CIR. Consults called: GI Dr. Oletta Lamas Admission status: Inpatient   Lady Deutscher MD FACP [facility] Pager [phone]  If 7PM-7AM, please contact night-coverage [email or web address] Password [facility]  11/24/2017, 5:23 PM

## 2017-11-24 NOTE — Progress Notes (Addendum)
Pt transferred to floor from 9m.  BP is 94/81 and HR is 111. Pt. Is alert and confused.  Patient had large bloody stool right after we got him settled.  MD notified and 1069ml bolus ordered and given.

## 2017-11-24 NOTE — Progress Notes (Signed)
Brief Note:   Pt noted to have active GI bleed.  GI, Hospitalist consulted, workup initiated. Likely transfer to acute.

## 2017-11-24 NOTE — Progress Notes (Signed)
Late note: Nurse and therapy reports frequent stools as well as tarry appearing stools this am. Stool guaiac checked and positive. He reported abdominal pain and has episode of maroon appearing stools. He was found to be hypotensive and TH was consulted to assist with transfer to acute floor.

## 2017-11-24 NOTE — Progress Notes (Signed)
Patient had another large maroon stool. CBC is drawn, awaiting for the results.

## 2017-11-24 NOTE — Discharge Summary (Signed)
Physician Discharge Summary  Patient ID: IMRAAN WENDELL MRN: [id number] DOB/AGE: Jun 29, 1928 84 y.o.  Admit date: 11/21/2017 Discharge date: 11/24/2017  Discharge Diagnoses:  Principal Problem:   GIB (gastrointestinal bleeding) Active Problems:   Subacute delirium   Acute ischemic right MCA stroke ([name])   Leucocytosis   Hypoalbuminemia due to protein-calorie malnutrition ([name])   Prediabetes   Prerenal azotemia   Acute lower UTI   Hypotension   Discharged Condition: Critical.   Significant Diagnostic Studies:  Dg Chest 2 View  Result Date: 11/21/2017 CLINICAL DATA:  Leukocytosis. EXAM: CHEST - 2 VIEW COMPARISON:  Radiograph of November 18, 2017. FINDINGS: Stable cardiomegaly. Single lead left-sided pacemaker is unchanged in position. No pneumothorax or pleural effusion is noted. Both lungs are clear. The visualized skeletal structures are unremarkable. IMPRESSION: No active cardiopulmonary disease. Electronically Signed   By: Marijo Conception, M.D.   On: 11/21/2017 21:49    Labs:  Basic Metabolic Panel: Recent Labs  Lab 11/18/17 0439 11/19/17 0251 11/20/17 0439 11/21/17 0347 11/22/17 0715  NA 139 136 134* 136 137  K 4.6 4.0 4.0 3.7 3.9  CL 110 106 103 104 106  CO2 22 24 24 23 24   GLUCOSE 143* 108* 129* 123* 122*  BUN 27* 25* 26* 33* 27*  CREATININE 1.05 0.91 0.92 0.90 0.85  CALCIUM 8.8* 8.8* 9.1 9.1 8.8*    CBC: Recent Labs  Lab 11/22/17 0715 11/24/17 0436 11/24/17 1532  WBC 16.7* 15.2* 22.0*  NEUTROABS 11.3* 9.8*  --   HGB 14.9 13.9 11.6*  HCT 43.5 42.5 35.0*  MCV 88.1 90.8 90.4  PLT 198 238 245    CBG: No results for input(s): GLUCAP in the last 168 hours.  Brief HPI:   Messiah Ahr is an 84 year old male with history of HTN, CHB s/p PPM, Afib, GERD/Schatzki's ring; who was admitted on 11/16/2017 with left-sided weakness, right gaze preference, slurred speech and mental status changes.  CT of head done showing right basal ganglia infarct infarct with  concerns of distal right MCA thrombus.  She underwent cerebral angiogram with revascularization of right MCA M2 segment by Dr. Estanislado Pandy.   Patient continued to have issues with confusion and delirium and repeat CT head on 7/19 showed propagation of acute right MCA territory infarct involving right insula and right temporoparietal lobe and dense right insular MCA consistent with thromboembolism.    Dr. Erlinda Hong felt that stroke was embolic due to A. fib and patient to start Eliquis on hospital day 5.  Patient has had issues with delirium sleep-wake sleep-wake cycle disruption agitation despite Seroquel.  Depakote was added at discharge.  He continues to be limited by left-sided weakness with right gaze preference, requires tactile and verbal cues to follow occasional one-step commands, short-term memory deficits lethargy and oral phase dysphagia requiring max cues to initiate swallow.  CIR was recommended due to functional decline.   Hospital Course: DORNELL GRASMICK was admitted to rehab 11/21/2017 for inpatient therapies to consist of PT, ST and OT at least three hours five days a week. Past admission physiatrist, therapy team and rehab RN have worked together to provide customized collaborative inpatient rehab.  Patient was cleared to start Eliquis at admission.  Morning dose Seroquel was DC'd to prevent daytime sedation and sleep-wake cycle has been monitored.  Due to evidence of progressive leukocytosis; chest x-ray and UA/UCS were ordered to rule out infection as cause of delirium.  Chest x-ray was negative for acute changes.  UA showed  evidence of UTI therefore Bactrim was added for treatment on 726.  IV fluids were changed to at bedtime and nutritional supplements were ordered to help with p.o. intake.  Patient has had fluctuating mental mental status with extremely poor p.o. intake therefore calorie count was initiated and concerns about need for alternate source of nutrition was discussed briefly with family.   He continues to have severe dysphagia and needs max multimodal cues to swallow safely.   Follow up labs past admission showed persistent leucocytosis with mild drop in H/H. He has had issues with constipation treated with suppository and enema on p.m. of 7/25.  Frequent stooling was reported today and as well as concerns of tarry stools.  Stool guaiac was checked and noted to be positive. Later that afternoon patient reported abdominal lower abdominal pain and was found to have maroon appearing stools.  Stat CBC, Lytes and type and cross were ordered for workup.   He was noted to be hypotensive with SBP in 80's. He was treated with fluid bolus with improvement in SBP to 90-100. He was noted to be lethargic and poorly responsive despite IV fluids. Eliquis was discontinued. Dr. Ricki Rodriguez was consulted for assistance and transfer.   Dr. Oletta Lamas was consulted to evaluated patient for work up of GIB. Patient was in need of closer monitoring and was discharged to a stepdown unit.   Medications at discharge:  . carvedilol  25 mg Oral BID WC  . chlorhexidine  15 mL Mouth Rinse BID  . divalproex  250 mg Oral Q8H  . feeding supplement (ENSURE ENLIVE)  237 mL Oral BID BM  . feeding supplement (PRO-STAT SUGAR FREE 64)  30 mL Oral BID  . hydrochlorothiazide  12.5 mg Oral Daily  . mouth rinse  15 mL Mouth Rinse q12n4p  . nicotine  7 mg Transdermal Daily  . pravastatin  20 mg Oral q1800  . QUEtiapine  25 mg Oral QHS  . sulfamethoxazole-trimethoprim  1 tablet Oral Q12H    Diet: Dysphagia 1, nectar liquids. Needs full supervision with meals.   Disposition: Acute hospital.    Discharge Instructions    Ambulatory referral to Physical Medicine Rehab   Complete by:  As directed    1-2 weeks transitional care appt      Follow-up Information    [name], MD Follow up.   Specialty:  Physical Medicine and Rehabilitation Contact information: [place]  [address] [phone]        [facility]. Call.   Specialty:  Neurology Why:  for follow up appointment in 4 weeks.  Contact information: [address] [place] [phone]       Luanne Bras, MD Follow up.   Specialties:  Interventional Radiology, Radiology Contact information: [place] [address] [phone]           Signed: Bary Leriche 11/24/2017, 5:33 PM

## 2017-11-24 NOTE — Progress Notes (Signed)
[facility]ICINE [facility] RE[facility]ILITATION     PROGRESS NOTE  Subjective/Complaints:  Patient sitting up in bed this morning. Son at bedside. Patient states he did not sleep well overnight. He cannot elaborate. Patient expresses issues with swallowing. Also discussed with nursing.  ROS: Unreliable due to cognition  Objective: Vital Signs: Blood pressure 122/64, pulse 67, temperature 98.1 F (36.7 C), temperature source Oral, resp. rate 18, height 5\' 7"  (1.702 m), weight 81.7 kg (180 lb 1.9 oz), SpO2 94 %. No results found. Recent Labs    11/22/17 0715 11/24/17 0436  WBC 16.7* 15.2*  HGB 14.9 13.9  HCT 43.5 42.5  PLT 198 238   Recent Labs    11/22/17 0715  NA 137  K 3.9  CL 106  GLUCOSE 122*  BUN 27*  CREATININE 0.85  CALCIUM 8.8*   CBG (last 3)  No results for input(s): GLUCAP in the last 72 hours.  Wt Readings from Last 3 Encounters:  11/21/17 81.7 kg (180 lb 1.9 oz)  11/18/17 85.5 kg (188 lb 7.9 oz)  11/01/17 85.4 kg (188 lb 3.2 oz)    Physical Exam:  BP 122/64 (BP Location: Left Arm)   Pulse 67   Temp 98.1 F (36.7 C) (Oral)   Resp 18   Ht 5\' 7"  (1.702 m)   Wt 81.7 kg (180 lb 1.9 oz)   SpO2 94%   BMI 28.21 kg/m  Constitutional: He appears well-developed. Frail male. HENT: Normocephalic and atraumatic.  Eyes: No discharge.  Cardiovascular: An irregularly irregular rhythm present. No JVD. Respiratory: Effort normal and breath sounds normal.  GI: Bowel sounds are normal. Nondistended Musculoskeletal: No edema or tenderness in extremities.  Neurological:  Lethargic, slowedstent Severe dysarthria  Left neglect with left sided weakness--unable accurately assess due to inattention, however greater than/equal to 3/5 (persistent) Skin: Skin is warm and dry.  Multiple healing abrasions bilateral shins. Well healed laceration right elbow/forearm.   Psychiatric: Unable to assess due to cognition  Assessment/Plan: 1. Functional deficits secondary to  right MCA infarct which require 3+ hours per day of interdisciplinary therapy in a comprehensive inpatient rehab setting. Physiatrist is providing close team supervision and 24 hour management of active medical problems listed below. Physiatrist and rehab team continue to assess barriers to discharge/monitor patient progress toward functional and medical goals.  Function:  Bathing Bathing position   Position: Wheelchair/chair at sink  Bathing parts Body parts bathed by patient: Right arm, Left arm, Chest, Abdomen, Front perineal area, Right upper leg, Left upper leg Body parts bathed by helper: Back, Left lower leg, Right lower leg, Buttocks  Bathing assist Assist Level: 2 helpers      Upper Body Dressing/Undressing Upper body dressing Upper body dressing/undressing activity did not occur: Safety/medical concerns(low level, IV line - hospital gown appropriate at this time) What is the patient wearing?: Pull over shirt/dress       Pull over shirt/dress - Perfomed by helper: Thread/unthread right sleeve, Thread/unthread left sleeve, Put head through opening, Pull shirt over trunk        Upper body assist        Lower Body Dressing/Undressing Lower body dressing   What is the patient wearing?: Non-skid slipper socks, Pants       Pants- Performed by helper: Thread/unthread right pants leg, Thread/unthread left pants leg, Pull pants up/down   Non-skid slipper socks- Performed by helper: Don/doff right sock, Don/doff left sock  Lower body assist Assist for lower body dressing: 2 Helpers      Toileting Toileting Toileting activity did not occur: Safety/medical concerns   Toileting steps completed by helper: Adjust clothing prior to toileting, Performs perineal hygiene, Adjust clothing after toileting Toileting Assistive Devices: Other (comment)(stedy)  Toileting assist Assist level: Two helpers   Transfers Chair/bed transfer   Chair/bed transfer  method: Stand pivot, Other Chair/bed transfer assist level: 2 helpers Chair/bed transfer assistive device: Mechanical lift Mechanical lift: Ecologist     Max distance: 8 Assist level: Maximal assist (Pt 25 - 49%)(+ 1 for IV and chair to follow)   Wheelchair Wheelchair activity did not occur: Safety/medical concerns(pt requires tilt in space due to lethargy and cognitive deficits) Type: Manual      Cognition Comprehension Comprehension assist level: Understands basic 25 - 49% of the time/ requires cueing 50 - 75% of the time  Expression Expression assist level: Expresses basis less than 25% of the time/requires cueing >75% of the time.  Social Interaction Social Interaction assist level: Interacts appropriately less than 25% of the time. May be withdrawn or combative.  Problem Solving Problem solving assist level: Solves basic less than 25% of the time - needs direction nearly all the time or does not effectively solve problems and may need a restraint for safety  Memory Memory assist level: Recognizes or recalls less than 25% of the time/requires cueing greater than 75% of the time    Medical Problem List and Plan: 1.  Left-sided weakness, right gaze preference, short-term memory deficits, lethargy, and dysphagia secondary to right MCA infarct.  Continue CIR 2.  DVT Prophylaxis/Anticoagulation: Pharmaceutical: Eliquis  3. Pain Management: tylenol prn.  4. Mood: LCSW to follow for evaluation  5. Neuropsych: This patient is not capable of making decisions on his own behalf. 6. Skin/Wound Care: routine pressure relief measures.  7. Fluids/Electrolytes/Nutrition: Monitor I/O.  8. Hospital psychosis/Delirium: Family reports delirium with every admission which gets better once at home. Question unmasking of dementia complicated by sleep wake disruption as well as deficits due to stroke.   D/ced am dose Seroquel.   Depakote added help with agitation--monitor for SE.    Sleep wake chart.  9. A fib: Monitor HR bid. On coreg bid.  10. Pre-renal azotemia:   Started calorie count to monitor intake. Offer magic cup with meals.  Changed IVF to hs.   BUN/Cr/creatinine improving on 7/24  Labs ordered for Monday 11. Leucocytosis:   WBCs 15.2 on 7/26  Afebrile  UA essentially negative, urine culture Enterobacter Cloacae  CXR  reviewed, relatively unremarkable for infectious process  Empiric Macrobid started on 7/25, changed to Bactrim due to intermediate resistance to Macrobid on 7/26 12. HTN: Monitor BP bid. On HCTZ and Coreg.   Slightly labile, however relatively controlled on 7/26  Monitor with increased mobility 13. Prediabetes  Monitor with increased mobility 4. Hypoalbuminemia  Supplement initiated on 7/24  LOS (Days) 3 A FACE TO FACE EVALUATION WAS PERFORMED  Adanya Sosinski [name] 11/24/2017 8:57 AM

## 2017-11-24 NOTE — Progress Notes (Signed)
Pt BP down to 75/55 during bolus, Rapid Response called and MD paged.

## 2017-11-24 NOTE — Progress Notes (Signed)
Occupational Therapy Session Note  Patient Details  Name: Dakota [name] MRN: [id number] Date of Birth: 1928/08/22  Today's Date: 11/24/2017 OT Individual Time: 1103-1200 OT Individual Time Calculation (min): 57 min    Short Term Goals: Week 1:  OT Short Term Goal 1 (Week 1): Pt will be able to sit to EOB with max A of 1 in prep for self care. OT Short Term Goal 2 (Week 1): Pt will be able to maintain static sitting EOB with min A in prep for self care. OT Short Term Goal 3 (Week 1): Pt will be able to use LUE to wash abdomen and chest with min cues. OT Short Term Goal 4 (Week 1): Pt will be able to complete bed >< w/c transfer with max A of 1 with either a slide board or a squat pivot.  OT Short Term Goal 5 (Week 1): Pt will identify items in his left visual field with min cues.   Skilled Therapeutic Interventions/Progress Updates:    Pt worked on bathing from the tilt in space wheelchair.  Max demonstrational cueing to locate wash pan and washcloth in front of him on the bedside table.  Initially washcloth placed to the left to promote scanning across midline but then transitioned to the right to promote functional reaching toward the pushing side.  Pt with severe perceptual deficits and when asked to reach for the washcloth in the washpan, he could not locate it.  He would reach laterally to the right to retrieve the bed pad off of the bed or any other object placed on the right.  Max hand over hand with the LUE to reach the washcloth, but once there he could squeeze it out with min assist.  On once occasion however he grasped the wash pan and pulled the water over into the floor.  Pt frequently having difficulty following directions, when told to reach forward to get the washcloth or to sit up out of the back of the chair, he would attempt to try and stand, requiring max demonstrational cueing for re-direction.  Total +2 (pt 30%) for partial sit to stand in order for therapy tech and nursing to  clean up bowel incontinence.  Pt with flexed posture and pushing to the left in standing.  He would also not let go of the left arm rest to come up taller.  Attempted use of the Stedy as well but he was not able to maintain standing upright in it for more than a few seconds with + 2 assistance.  Total assist for all dressing with pt placed back in the reclined position in the tilt in space for safety.  Nursing present and pt with chair alarm belt in place as well.    Therapy Documentation Precautions:  Precautions Precautions: Fall Precaution Comments: right gaze, left hemiparesis, left lean /pushing Restrictions Weight Bearing Restrictions: No   Pain: Pain Assessment Pain Scale: Faces Pain Score: 0-No pain Pain Location: Hip Pain Orientation: Right;Left Pain Descriptors / Indicators: Aching Pain Frequency: Occasional Pain Onset: On-going Patients Stated Pain Goal: 2 Pain Intervention(s): Repositioned ADL: See Function Navigator for Current Functional Status.   Therapy/Group: Individual Therapy  Nianna Igo OTR/L 11/24/2017, 12:12 PM

## 2017-11-24 NOTE — Progress Notes (Signed)
NT reported that patient had what appeared to be red in his stool. Stool was maroon in color. Pt had previous episodes of black, tarry stools in am. Pt has history of Afib and was given eloquis this morning. Hemocult slide was postitive. PA Reesa Chew at bedside. Pt appeared to be pale and lethargic. Pt complaining of lower abdominal pain. Pt was found to be hypotensive at 83/64. PA ordered fluid bolus and rapid response was initiated. Pt responded well to fluid bolus and patient's blood pressure is continuing to rise slowly. Hospitalist examined patient at bedside and orders to transfer patient to an acute floor were ordered. Pt's vitals are stable at this moment. Report was called to RN on 5W08. Pt's family at bedside and change in status was explained to family. Pt transferred with all belongings.

## 2017-11-24 NOTE — Progress Notes (Signed)
Physical Therapy Session Note  Patient Details  Name: Dakota [name] MRN: [id number] Date of Birth: 01-Jun-1928  Today's Date: 11/24/2017 PT Individual Time: 0900-1000 AND 1445-1515 PT Individual Time Calculation (min): 60 min AND 30 min   Short Term Goals: Week 1:  PT Short Term Goal 1 (Week 1): pt will move supine> sit with mod assist PT Short Term Goal 2 (Week 1): pt will transfer via stand pivot 50% of trials with mod assist PT Short Term Goal 3 (Week 1): pt will perform gait with assist of 1 person mod assist, x 20'  Skilled Therapeutic Interventions/Progress Updates:   Pt received supine in bed and agreeable to PT. Supine>sit transfer with moderate assist and moderate cues for trunk control. Sitting balance EOB with moderate assist.  Stedy +2 transfer WC. Gait training at rail in hall with max assist +2. Visual feedback from mirror to maintain midline, and max cues for attention to task.   Sit<>stand at RW x 4 with max progessing to mod assist followed by Standing balance with RW. 4 x 30 sec with mod assist overall. Max cues for attention to the L in standing to attend mirror for visual feedback and maintain midlin.  Seated Kinetron 5 x 30 sec with min assist throughout for full ROM in the LLE. Max cues to attend to L throughout exercise. Pt left seated in [facility] with son present in day room for [facility] treatment.   Session 2 .   Pt received supine in bed and agreeable to PT. Supine>sit transfer with total assist and max cues for decreased pushing throughout RUE. Pt reports that he feels like he had a bowel movement when moved to seated position. Lateral scooting to [facility] with max assist from PT. NT assist PT in personal hygiene with moderate assist for rolling R and L to manage clothing complete cleaning by NT. Pt left supine in bed with PA present to discuss appearance of stool with pt.           Therapy Documentation Precautions:  Precautions Precautions: Fall Precaution Comments:  right gaze, left hemiparesis, left lean /pushing Restrictions Weight Bearing Restrictions: No    Vital Signs: Therapy Vitals Pulse Rate: 67 BP: 122/64 Patient Position (if appropriate): Lying Pain: Pain Assessment Pain Scale: 0-10 Pain Score: 3  Pain Location: Hip Pain Orientation: Right;Left Pain Descriptors / Indicators: Aching Pain Frequency: Occasional Pain Onset: On-going Patients Stated Pain Goal: 2 Pain Intervention(s): Repositioned  See Function Navigator for Current Functional Status.   Therapy/Group: Individual Therapy  [name] 11/24/2017, 10:06 AM

## 2017-11-24 NOTE — Progress Notes (Signed)
Patient with additional bloody stool per RN, and became hypotensive 75/55 HR 101.  Mental status remained the same.  BP improved with 1L ns bolus to 106/69 HR 89.  Protonix bolus given, Protonix gtt started.

## 2017-11-24 NOTE — Consult Note (Signed)
[place] Reason for consult:GI bleed Referring Physician: [facility] hospitalist.  PCP: Dr. Damita Dunnings.  Primary GI: Has seen multiple GI as in the past.  Dakota [name] is an 84 y.o. male.  HPI: He has recently suffered a stroke.  He has been on the rehab unit undergoing various treatments.  He has had slurred speech and difficulty swallowing and has had to be monitored while eating.  He has had a complete heart block and has a pacemaker.  He has A. fib and there was a question if the stroke was result of A. fib.  He has had a cerebral angiogram in the right MCA was revascularized by the radiology team.  He is continued to have confusion and delirium.  Due to the A. fib and the concern over this causing the stroke he was started on Eliquis.  He is continued to have behavioral and mental problems.  He took the Eliquis this morning.  He passed some maroonish stool, dropped his blood pressure.  Hemoglobin is dropped from 14-11.6 and his BUN was normal but has gone up to 54.  The patient had a food impaction and an inflamed esophageal ring during removal of the food impaction by Dr. Alessandra Bevels 9/17 and it appears he was started on Protonix at that time.  The best I can tell from looking at the records he has been on Protonix even since being here in the hospital.  Nursing staff notes that he had one or 2 dark tarry stools but has not had any bright red blood.  Past Medical History:  Diagnosis Date  . Anxiety   . Back pain, chronic   . Complete heart block ([facility]) 07/18/2014   Medtronic Sumner model [id number] (serial number [id number])  singe lead PPM  . Esophageal obstruction due to food impaction 2017  . GERD (gastroesophageal reflux disease) 10/2002  . Hyperlipidemia 12/1994  . Hypertension   . Hypokalemia   . Lung collapse 03/1991   Fall  . Permanent atrial fibrillation ([name])    Refused coumadin therapy    Past Surgical History:  Procedure Laterality Date  . BACK SURGERY  11/91    with hardware fixation  . CARDIAC CATHETERIZATION  06/2004   30 % stenosis, EF normal  . ESOPHAGOGASTRODUODENOSCOPY N/A 01/06/2016   Procedure: ESOPHAGOGASTRODUODENOSCOPY (EGD);  Surgeon: Otis Brace, MD;  Location: [facility];  Service: Gastroenterology;  Laterality: N/A;  . EYE SURGERY     Retinal bubble surgery, cataract   . IR CT HEAD LTD  11/16/2017  . IR PERCUTANEOUS ART THROMBECTOMY/INFUSION INTRACRANIAL INC DIAG ANGIO  11/16/2017  . LEFT HEART CATHETERIZATION WITH CORONARY ANGIOGRAM N/A 07/18/2014   Procedure: LEFT HEART CATHETERIZATION WITH CORONARY ANGIOGRAM;  Surgeon: Jettie Booze, MD; [facility] [place], LAD mild dz, D1 80%, D2 OK, CFX system OK, RCA OK, PDA 100%, med rx  . PERMANENT PACEMAKER INSERTION N/A 07/18/2014   Procedure: PERMANENT PACEMAKER INSERTION;  Surgeon: Thompson Grayer, MD; Medtronic Bridgewater model [phone] (serial number [id number])    . RADIOLOGY WITH ANESTHESIA N/A 11/16/2017   Procedure: RADIOLOGY WITH ANESTHESIA;  Surgeon: Luanne Bras, MD;  Location: [facility];  Service: Radiology;  Laterality: N/A;  . TEMPORARY PACEMAKER INSERTION N/A 07/18/2014   Procedure: TEMPORARY PACEMAKER INSERTION;  Surgeon: Peter M Martinique, MD;  Location: [facility] CATH LAB;  Service: Cardiovascular;  Laterality: N/A;    Family History  Problem Relation Age of Onset  . Pneumonia Mother   . Hip fracture Mother   . Cancer Father  splenic  . Heart disease Sister        Pacer placed  . Cancer Brother        throat and lung  . Cancer Brother        prostate, age 50  . Prostate cancer Brother   . Heart disease Son   . Stroke Neg Hx   . Colon cancer Neg Hx     Social History:  reports that he quit smoking about 64 years ago. He has a 15.00 pack-year smoking history. His smokeless tobacco use includes chew. He reports that he does not drink alcohol or use drugs.  Allergies:  Allergies  Allergen Reactions  . Warfarin And Related Other (See Comments)    Lost a lot of weight and also  experienced numbness  . Atorvastatin Other (See Comments)    REACTION: MUSCLE PAIN  . Ezetimibe Other (See Comments)    REACTION: MUSCLE PAIN  . Diazepam Other (See Comments)    UNKNOWN, per pt  . Penicillins Other (See Comments)    UNKNOWN, per pt    Medications; Prior to Admission medications   Medication Sig Start Date End Date Taking? Authorizing Provider  ALPRAZolam (XANAX) 0.5 MG tablet TAKE ONE-HALF TABLET BY MOUTH TWICE DAILY AS NEEDED FOR ANXIETY Patient taking differently: Take 0.25 mg by mouth 2 (two) times daily as needed for anxiety. TAKE ONE-HALF TABLET BY MOUTH TWICE DAILY AS NEEDED FOR ANXIETY 10/14/17   Tonia Ghent, MD  apixaban (ELIQUIS) 5 MG TABS tablet Take 1 tablet (5 mg total) by mouth 2 (two) times daily. 11/21/17   Donzetta Starch, NP  calcium carbonate (TUMS - DOSED IN MG ELEMENTAL CALCIUM) 500 MG chewable tablet Chew 1 tablet by mouth daily as needed for indigestion.     [provider]  carvedilol (COREG) 25 MG tablet TAKE 1 TABLET BY MOUTH TWICE DAILY WITH  A  MEAL. 08/03/17   Josue Hector, MD  divalproex (DEPAKOTE SPRINKLE) 125 MG capsule Take 2 capsules (250 mg total) by mouth every 8 (eight) hours. 11/21/17   Donzetta Starch, NP  docusate sodium (COLACE) 100 MG capsule Take 1 capsule (100 mg total) by mouth daily as needed for mild constipation. 01/23/17   Tonia Ghent, MD  hydrochlorothiazide (MICROZIDE) 12.5 MG capsule TAKE ONE CAPSULE BY MOUTH ONCE DAILY 07/27/17   Josue Hector, MD  Magnesium Hydroxide (MILK OF MAGNESIA PO) Take 1 tablet by mouth daily as needed.    [provider]  Maltodextrin-Xanthan Gum (RESOURCE THICKENUP CLEAR) POWD Take 120 g by mouth as needed (nectar thick liquid consistency). 11/21/17   Donzetta Starch, NP  nicotine (NICODERM CQ - DOSED IN MG/24 HR) 7 mg/24hr patch Place 1 patch (7 mg total) onto the skin daily. 11/22/17   Donzetta Starch, NP  nitroGLYCERIN (NITROSTAT) 0.4 MG SL tablet Place 1 tablet (0.4 mg  total) under the tongue every 5 (five) minutes as needed for chest pain. 01/07/15   Josue Hector, MD  pantoprazole (PROTONIX) 40 MG tablet Take 1 tablet (40 mg total) by mouth daily. 01/06/16   Domenic Moras, PA-C  pravastatin (PRAVACHOL) 20 MG tablet Take 1 tablet (20 mg total) by mouth daily at 6 PM. 11/21/17   Donzetta Starch, NP  QUEtiapine (SEROQUEL) 25 MG tablet Take 0.5 tablets (12.5 mg total) by mouth daily. 11/22/17   Donzetta Starch, NP  QUEtiapine (SEROQUEL) 25 MG tablet Take 1 tablet (25 mg total) by mouth at  bedtime. 11/21/17   Donzetta Starch, NP  ranitidine (ZANTAC) 150 MG tablet TAKE 1 TABLET BY MOUTH ONCE DAILY 07/27/17   Josue Hector, MD   . divalproex  250 mg Oral Q8H  . famotidine  20 mg Oral BID  . nicotine  7 mg Transdermal Daily  . [START ON 11/28/2017] pantoprazole  40 mg Intravenous Q12H  . pravastatin  20 mg Oral q1800  . [START ON 11/25/2017] QUEtiapine  12.5 mg Oral Daily  . QUEtiapine  25 mg Oral QHS  . sodium chloride flush  3 mL Intravenous Q12H   PRN Meds ALPRAZolam, RESOURCE THICKENUP CLEAR, traMADol Results for orders placed or performed during the hospital encounter of 11/21/17 (from the past 48 hour(s))  CBC with Differential/Platelet     Status: Abnormal   Collection Time: 11/24/17  4:36 AM  Result Value Ref [place]   WBC 15.2 (H) 4.0 - 10.5 K/uL   RBC 4.68 4.22 - 5.81 MIL/uL   Hemoglobin 13.9 13.0 - 17.0 g/dL   HCT 42.5 39.0 - 52.0 %   MCV 90.8 78.0 - 100.0 fL   MCH 29.7 26.0 - 34.0 pg   MCHC 32.7 30.0 - 36.0 g/dL   RDW 12.8 11.5 - 15.5 %   Platelets 238 150 - 400 K/uL   Neutrophils Relative % 64 %   Neutro Abs 9.8 (H) 1.7 - 7.7 K/uL   Lymphocytes Relative 18 %   Lymphs Abs 2.7 0.7 - 4.0 K/uL   Monocytes Relative 12 %   Monocytes Absolute 1.8 (H) 0.1 - 1.0 K/uL   Eosinophils Relative 4 %   Eosinophils Absolute 0.5 0.0 - 0.7 K/uL   Basophils Relative 0 %   Basophils Absolute 0.1 0.0 - 0.1 K/uL   Immature Granulocytes 2 %   Abs Immature Granulocytes  0.3 (H) 0.0 - 0.1 K/uL    Comment: Performed at [facility], [address] [place], [place] [address]  CBC     Status: Abnormal   Collection Time: 11/24/17  3:32 PM  Result Value Ref [place]   WBC 22.0 (H) 4.0 - 10.5 K/uL   RBC 3.87 (L) 4.22 - 5.81 MIL/uL   Hemoglobin 11.6 (L) 13.0 - 17.0 g/dL   HCT 35.0 (L) 39.0 - 52.0 %   MCV 90.4 78.0 - 100.0 fL   MCH 30.0 26.0 - 34.0 pg   MCHC 33.1 30.0 - 36.0 g/dL   RDW 12.9 11.5 - 15.5 %   Platelets 245 150 - 400 K/uL    Comment: Performed at [facility], [place] [address] [place],  [address]  Basic metabolic panel     Status: Abnormal   Collection Time: 11/24/17  3:32 PM  Result Value Ref [place]   Sodium 138 135 - 145 mmol/L   Potassium 4.8 3.5 - 5.1 mmol/L   Chloride 106 98 - 111 mmol/L   CO2 22 22 - 32 mmol/L   Glucose, Bld 124 (H) 70 - 99 mg/dL   BUN 54 (H) 8 - 23 mg/dL   Creatinine, Ser 1.00 0.61 - 1.24 mg/dL   Calcium 8.5 (L) 8.9 - 10.3 mg/dL   GFR calc non Af Amer >60 >60 mL/min   GFR calc Af Amer >60 >60 mL/min    Comment: (NOTE) The eGFR has been calculated using the CKD EPI equation. This calculation has not been validated in all clinical situations. eGFR's persistently <60 mL/min signify possible Chronic Kidney Disease.    Anion gap  10 5 - 15    Comment: Performed at [facility], [place] [address], [place], [place] [address]  Type and screen [place]     Status: None   Collection Time: 11/24/17  3:32 PM  Result Value Ref [place]   ABO/RH(D) A POS    Antibody Screen NEG    Sample Expiration      11/27/2017 Performed at [facility], [place] [address], [place], [place] [address]   ABO/Rh     Status: None   Collection Time: 11/24/17  3:32 PM  Result Value Ref [place]   ABO/RH(D)      A POS Performed at [facility] [address], [place], [place] [address]   Occult blood card to lab, stool RN will collect     Status: Abnormal   Collection Time: 11/24/17  3:58 PM   Result Value Ref [place]   Fecal Occult Bld POSITIVE (A) NEGATIVE    Comment: Performed at [facility] [address], [place],  [address]    No results found. ROS: Not obtainable            Blood pressure 102/82, pulse 100, temperature 98 F (36.7 C), temperature source Oral, resp. rate (!) 23, SpO2 95 %.  Physical exam:   General--patient minimally responsive but able to move his right hand fairly well seems to deny pain ENT--nonicteric Neck--supple Heart--irregular.  Monitor shows A. fib rate of around 100 Lungs--clear Abdomen--nondistended soft and nontender    Assessment: 1.  GI bleed.  Appears to be upper GI.  Still active but he had a dose of Eliquis this morning.  Hopefully this will stop now that the Eliquis is on hold 2.  Recent significant stroke 3.  Complete heart block with pacemaker 4.  Permanent A. fib for a number of years   Plan: I would continue to give him IV Protonix.  Continue to hold his Eliquis.  He will likely need an upper endoscopy but would prefer to wait another day or 2 until the Eliquis is completely out of his system.   Nancy Fetter 11/24/2017, 8:10 PM   This note was created using voice recognition software and minor errors may Have occurred unintentionally. Pager: [phone] If no answer or after hours call [phone]

## 2017-11-24 NOTE — H&P (View-Only) (Signed)
[place] Reason for consult:GI bleed Referring Physician: [facility] hospitalist.  PCP: Dr. Damita Dunnings.  Primary GI: Has seen multiple GI as in the past.  Dakota [name] is an 84 y.o. male.  HPI: He has recently suffered a stroke.  He has been on the rehab unit undergoing various treatments.  He has had slurred speech and difficulty swallowing and has had to be monitored while eating.  He has had a complete heart block and has a pacemaker.  He has A. fib and there was a question if the stroke was result of A. fib.  He has had a cerebral angiogram in the right MCA was revascularized by the radiology team.  He is continued to have confusion and delirium.  Due to the A. fib and the concern over this causing the stroke he was started on Eliquis.  He is continued to have behavioral and mental problems.  He took the Eliquis this morning.  He passed some maroonish stool, dropped his blood pressure.  Hemoglobin is dropped from 14-11.6 and his BUN was normal but has gone up to 54.  The patient had a food impaction and an inflamed esophageal ring during removal of the food impaction by Dr. Alessandra Bevels 9/17 and it appears he was started on Protonix at that time.  The best I can tell from looking at the records he has been on Protonix even since being here in the hospital.  Nursing staff notes that he had one or 2 dark tarry stools but has not had any bright red blood.  Past Medical History:  Diagnosis Date  . Anxiety   . Back pain, chronic   . Complete heart block ([facility]) 07/18/2014   Medtronic [place]ner model [id number] (serial number [id number])  singe lead PPM  . Esophageal obstruction due to food impaction 2017  . GERD (gastroesophageal reflux disease) 10/2002  . Hyperlipidemia 12/1994  . Hypertension   . Hypokalemia   . Lung collapse 03/1991   Fall  . Permanent atrial fibrillation ([name])    Refused coumadin therapy    Past Surgical History:  Procedure Laterality Date  . BACK SURGERY  11/91    with hardware fixation  . CARDIAC CATHETERIZATION  06/2004   30 % stenosis, EF normal  . ESOPHAGOGASTRODUODENOSCOPY N/A 01/06/2016   Procedure: ESOPHAGOGASTRODUODENOSCOPY (EGD);  Surgeon: Otis Brace, MD;  Location: [facility];  Service: Gastroenterology;  Laterality: N/A;  . EYE SURGERY     Retinal bubble surgery, cataract   . IR CT HEAD LTD  11/16/2017  . IR PERCUTANEOUS ART THROMBECTOMY/INFUSION INTRACRANIAL INC DIAG ANGIO  11/16/2017  . LEFT HEART CATHETERIZATION WITH CORONARY ANGIOGRAM N/A 07/18/2014   Procedure: LEFT HEART CATHETERIZATION WITH CORONARY ANGIOGRAM;  Surgeon: Jettie Booze, MD; [facility] [place], LAD mild dz, D1 80%, D2 OK, CFX system OK, RCA OK, PDA 100%, med rx  . PERMANENT PACEMAKER INSERTION N/A 07/18/2014   Procedure: PERMANENT PACEMAKER INSERTION;  Surgeon: Thompson Grayer, MD; Medtronic Bridgewater model [phone] (serial number [id number])    . RADIOLOGY WITH ANESTHESIA N/A 11/16/2017   Procedure: RADIOLOGY WITH ANESTHESIA;  Surgeon: Luanne Bras, MD;  Location: [facility];  Service: Radiology;  Laterality: N/A;  . TEMPORARY PACEMAKER INSERTION N/A 07/18/2014   Procedure: TEMPORARY PACEMAKER INSERTION;  Surgeon: Peter M Martinique, MD;  Location: [facility] C[facility];  Service: Cardiovascular;  Laterality: N/A;    Family History  Problem Relation Age of Onset  . Pneumonia Mother   . Hip fracture Mother   . Cancer Father  splenic  . Heart disease Sister        Pacer placed  . Cancer Brother        throat and lung  . Cancer Brother        prostate, age 50  . Prostate cancer Brother   . Heart disease Son   . Stroke Neg Hx   . Colon cancer Neg Hx     Social History:  reports that he quit smoking about 64 years ago. He has a 15.00 pack-year smoking history. His smokeless tobacco use includes chew. He reports that he does not drink alcohol or use drugs.  Allergies:  Allergies  Allergen Reactions  . Warfarin And Related Other (See Comments)    Lost a lot of weight and also  experienced numbness  . Atorvastatin Other (See Comments)    REACTION: MUSCLE PAIN  . Ezetimibe Other (See Comments)    REACTION: MUSCLE PAIN  . Diazepam Other (See Comments)    UNKNOWN, per pt  . Penicillins Other (See Comments)    UNKNOWN, per pt    Medications; Prior to Admission medications   Medication Sig Start Date End Date Taking? Authorizing Provider  ALPRAZolam (XANAX) 0.5 MG tablet TAKE ONE-HALF TABLET BY MOUTH TWICE DAILY AS NEEDED FOR ANXIETY Patient taking differently: Take 0.25 mg by mouth 2 (two) times daily as needed for anxiety. TAKE ONE-HALF TABLET BY MOUTH TWICE DAILY AS NEEDED FOR ANXIETY 10/14/17   Tonia Ghent, MD  apixaban (ELIQUIS) 5 MG TABS tablet Take 1 tablet (5 mg total) by mouth 2 (two) times daily. 11/21/17   Donzetta Starch, NP  calcium carbonate (TUMS - DOSED IN MG ELEMENTAL CALCIUM) 500 MG chewable tablet Chew 1 tablet by mouth daily as needed for indigestion.     [provider]  carvedilol (COREG) 25 MG tablet TAKE 1 TABLET BY MOUTH TWICE DAILY WITH  A  MEAL. 08/03/17   Josue Hector, MD  divalproex (DEPAKOTE SPRINKLE) 125 MG capsule Take 2 capsules (250 mg total) by mouth every 8 (eight) hours. 11/21/17   Donzetta Starch, NP  docusate sodium (COLACE) 100 MG capsule Take 1 capsule (100 mg total) by mouth daily as needed for mild constipation. 01/23/17   Tonia Ghent, MD  hydrochlorothiazide (MICROZIDE) 12.5 MG capsule TAKE ONE CAPSULE BY MOUTH ONCE DAILY 07/27/17   Josue Hector, MD  Magnesium Hydroxide (MILK OF MAGNESIA PO) Take 1 tablet by mouth daily as needed.    [provider]  Maltodextrin-Xanthan Gum (RESOURCE THICKENUP CLEAR) POWD Take 120 g by mouth as needed (nectar thick liquid consistency). 11/21/17   Donzetta Starch, NP  nicotine (NICODERM CQ - DOSED IN MG/24 HR) 7 mg/24hr patch Place 1 patch (7 mg total) onto the skin daily. 11/22/17   Donzetta Starch, NP  nitroGLYCERIN (NITROSTAT) 0.4 MG SL tablet Place 1 tablet (0.4 mg  total) under the tongue every 5 (five) minutes as needed for chest pain. 01/07/15   Josue Hector, MD  pantoprazole (PROTONIX) 40 MG tablet Take 1 tablet (40 mg total) by mouth daily. 01/06/16   Domenic Moras, PA-C  pravastatin (PRAVACHOL) 20 MG tablet Take 1 tablet (20 mg total) by mouth daily at 6 PM. 11/21/17   Donzetta Starch, NP  QUEtiapine (SEROQUEL) 25 MG tablet Take 0.5 tablets (12.5 mg total) by mouth daily. 11/22/17   Donzetta Starch, NP  QUEtiapine (SEROQUEL) 25 MG tablet Take 1 tablet (25 mg total) by mouth at  bedtime. 11/21/17   Donzetta Starch, NP  ranitidine (ZANTAC) 150 MG tablet TAKE 1 TABLET BY MOUTH ONCE DAILY 07/27/17   Josue Hector, MD   . divalproex  250 mg Oral Q8H  . famotidine  20 mg Oral BID  . nicotine  7 mg Transdermal Daily  . [START ON 11/28/2017] pantoprazole  40 mg Intravenous Q12H  . pravastatin  20 mg Oral q1800  . [START ON 11/25/2017] QUEtiapine  12.5 mg Oral Daily  . QUEtiapine  25 mg Oral QHS  . sodium chloride flush  3 mL Intravenous Q12H   PRN Meds ALPRAZolam, RESOURCE THICKENUP CLEAR, traMADol Results for orders placed or performed during the hospital encounter of 11/21/17 (from the past 48 hour(s))  CBC with Differential/Platelet     Status: Abnormal   Collection Time: 11/24/17  4:36 AM  Result Value Ref [place]   WBC 15.2 (H) 4.0 - 10.5 K/uL   RBC 4.68 4.22 - 5.81 MIL/uL   Hemoglobin 13.9 13.0 - 17.0 g/dL   HCT 42.5 39.0 - 52.0 %   MCV 90.8 78.0 - 100.0 fL   MCH 29.7 26.0 - 34.0 pg   MCHC 32.7 30.0 - 36.0 g/dL   RDW 12.8 11.5 - 15.5 %   Platelets 238 150 - 400 K/uL   Neutrophils Relative % 64 %   Neutro Abs 9.8 (H) 1.7 - 7.7 K/uL   Lymphocytes Relative 18 %   Lymphs Abs 2.7 0.7 - 4.0 K/uL   Monocytes Relative 12 %   Monocytes Absolute 1.8 (H) 0.1 - 1.0 K/uL   Eosinophils Relative 4 %   Eosinophils Absolute 0.5 0.0 - 0.7 K/uL   Basophils Relative 0 %   Basophils Absolute 0.1 0.0 - 0.1 K/uL   Immature Granulocytes 2 %   Abs Immature Granulocytes  0.3 (H) 0.0 - 0.1 K/uL    Comment: Performed at [facility], [address] [place], [place] [address]  CBC     Status: Abnormal   Collection Time: 11/24/17  3:32 PM  Result Value Ref [place]   WBC 22.0 (H) 4.0 - 10.5 K/uL   RBC 3.87 (L) 4.22 - 5.81 MIL/uL   Hemoglobin 11.6 (L) 13.0 - 17.0 g/dL   HCT 35.0 (L) 39.0 - 52.0 %   MCV 90.4 78.0 - 100.0 fL   MCH 30.0 26.0 - 34.0 pg   MCHC 33.1 30.0 - 36.0 g/dL   RDW 12.9 11.5 - 15.5 %   Platelets 245 150 - 400 K/uL    Comment: Performed at [facility], [place] [address] [place],  [address]  Basic metabolic panel     Status: Abnormal   Collection Time: 11/24/17  3:32 PM  Result Value Ref [place]   Sodium 138 135 - 145 mmol/L   Potassium 4.8 3.5 - 5.1 mmol/L   Chloride 106 98 - 111 mmol/L   CO2 22 22 - 32 mmol/L   Glucose, Bld 124 (H) 70 - 99 mg/dL   BUN 54 (H) 8 - 23 mg/dL   Creatinine, Ser 1.00 0.61 - 1.24 mg/dL   Calcium 8.5 (L) 8.9 - 10.3 mg/dL   GFR calc non Af Amer >60 >60 mL/min   GFR calc Af Amer >60 >60 mL/min    Comment: (NOTE) The eGFR has been calculated using the CKD EPI equation. This calculation has not been validated in all clinical situations. eGFR's persistently <60 mL/min signify possible Chronic Kidney Disease.    Anion gap  10 5 - 15    Comment: Performed at [facility], [place] [address], [place], [place] [address]  Type and screen [place]     Status: None   Collection Time: 11/24/17  3:32 PM  Result Value Ref [place]   ABO/RH(D) A POS    Antibody Screen NEG    Sample Expiration      11/27/2017 Performed at [facility], [place] [address], [place], [place] [address]   ABO/Rh     Status: None   Collection Time: 11/24/17  3:32 PM  Result Value Ref [place]   ABO/RH(D)      A POS Performed at [facility] [address], [place], [place] [address]   Occult blood card to lab, stool RN will collect     Status: Abnormal   Collection Time: 11/24/17  3:58 PM   Result Value Ref [place]   Fecal Occult Bld POSITIVE (A) NEGATIVE    Comment: Performed at [facility] [address], [place],  [address]    No results found. ROS: Not obtainable            Blood pressure 102/82, pulse 100, temperature 98 F (36.7 C), temperature source Oral, resp. rate (!) 23, SpO2 95 %.  Physical exam:   General--patient minimally responsive but able to move his right hand fairly well seems to deny pain ENT--nonicteric Neck--supple Heart--irregular.  Monitor shows A. fib rate of around 100 Lungs--clear Abdomen--nondistended soft and nontender    Assessment: 1.  GI bleed.  Appears to be upper GI.  Still active but he had a dose of Eliquis this morning.  Hopefully this will stop now that the Eliquis is on hold 2.  Recent significant stroke 3.  Complete heart block with pacemaker 4.  Permanent A. fib for a number of years   Plan: I would continue to give him IV Protonix.  Continue to hold his Eliquis.  He will likely need an upper endoscopy but would prefer to wait another day or 2 until the Eliquis is completely out of his system.   Nancy Fetter 11/24/2017, 8:10 PM   This note was created using voice recognition software and minor errors may Have occurred unintentionally. Pager: [phone] If no answer or after hours call [phone]

## 2017-11-24 NOTE — Progress Notes (Signed)
Speech Language Pathology Daily Session Note  Patient Details  Name: Dakota [name] MRN: [id number] Date of Birth: [birth year]  Today's Date: 11/24/2017 SLP Individual Time: 1005-1100 SLP Individual Time Calculation (min): 55 min  Short Term Goals: Week 1: SLP Short Term Goal 1 (Week 1): Pt will consume dys 1 textures and nectar thick liquids via teaspoon with max assist multimodal cues and minimal overt s/s of aspiration.  SLP Short Term Goal 2 (Week 1): Pt will utilize external aids to reorient to place, date, and situation with mod assist multimodal cues.  SLP Short Term Goal 3 (Week 1): Pt will sustain his attention to basic, familiar tasks for ~1 minute intervals with max assist multimodal cues.  SLP Short Term Goal 4 (Week 1): Pt will slow rate and increase vocal intensity to achieve intelligibility at the word level with max assist.   SLP Short Term Goal 5 (Week 1): Pt will locate items to the left of midline during basic familiar tasks in ~50% of opportunities with max assist multimodal cues.   Skilled Therapeutic Interventions:  Pt was seen for skilled ST targeting cognitive and dysphagia goals.  Nursing reports that pt had decreased oral holding last night with dinner meal but needed suctioning again this morning with breakfast and med administration.  Suspect that pt will continue to be inconsistent due to profound cognitive deficits and fluctuating attentiveness and alertness but did provide some suggestions to RN and nurse tech to improve awareness of bolus and oral transit.   During therapy session, pt did appear brighter and clearer in comparison to previous therapy sessions.  Pt was even able to feed himself a functional snack of purees and nectar thick liquids via teaspoon with min assist for initiation and sequencing.  Pt had some congested coughing during session which appeared to be related to decreased management of secretions versus directly related to PO intake.  Continue to  recommend dys 1 textures and nectar thick liquids via teaspoon and full supervision for use of swallowing precautions due to ongoing risk of aspiration as well as dehydration and malnutrition.  SLP facilitated the session with a simple card game to address attention and visual scanning to the left of midline.  Pt was able to attend to task for ~1 minute intervals with mod-max cues for redirection.  Pt needed mod-max cues to locate cards that were placed to the left of midline.  Pt was returned to room and left in tilt in space wheelchair with chair alarm set and call bell within reach.  Son was at bedside.  Continue per current plan of care.    Function:  Eating Eating   Modified Consistency Diet: Yes Eating Assist Level: Set up assist for;Supervision or verbal cues   Eating Set Up Assist For: Opening containers       Cognition Comprehension Comprehension assist level: Understands basic 50 - 74% of the time/ requires cueing 25 - 49% of the time  Expression   Expression assist level: Expresses basic 25 - 49% of the time/requires cueing 50 - 75% of the time. Uses single words/gestures.  Social Interaction Social Interaction assist level: Interacts appropriately 25 - 49% of time - Needs frequent redirection.  Problem Solving Problem solving assist level: Solves basic 25 - 49% of the time - needs direction more than half the time to initiate, plan or complete simple activities  Memory Memory assist level: Recognizes or recalls less than 25% of the time/requires cueing greater than 75% of the time  Pain Pain Assessment Pain Scale: 0-10 Pain Score: 0-No pain  Therapy/Group: Individual Therapy  Akaiya Touchette, Selinda Orion 11/24/2017, 4:04 PM

## 2017-11-24 NOTE — Significant Event (Signed)
Rapid Response Event Note  Overview: Time Called: 1540 Arrival Time: 1543 Event Type: Hypotension  Initial Focused Assessment: Patient in rehab sp CVA. Left side weakness, aphasia. History of PPM and AF currently on eliquis Per RN and PA today patient has had 2 bloody stools.  This morning it was "tarry" this afternoon it was maroon. He was able to participate normally with therapy this am.  This afternoon he is more lethargic, awakes and answers yes no questions. Some abdominal pain, positive BS BP 86/64  Irregular heart rate 94  RR 16  O2 sat 97  Interventions: 1L NS Consult to hospitalist and [facility] BP improved to 119/60 with fluids infusing.  Readmit to hospital, [place] level care  Plan of Care (if not transferred):  Event Summary: Name of Physician Notified: Algis Liming PA at bedside at    Name of Consulting Physician Notified: Hospitalist service and [facility] service at [address]

## 2017-11-24 NOTE — Progress Notes (Addendum)
Day 2 of 3 Calorie Count Note  72 hour calorie count ordered.  Diet: Dysphagia 1 diet with nectar thick consistency Supplements:  Ensure Enlive po BID, each supplement provides 350 kcal and 20 grams of protein. 30 ml Prostat po BID, each supplement provides 100 kcal and 15 grams of protein.   Breakfast: 115 kcal, 2 grams of protein Lunch: 25 kcal, 4 grams of protein Dinner: 189 kcal, 14 grams of protein Supplements: 550 kcal, 50 grams of protein  Day 2 Total intake: 879 kcal (52% of minimum estimated needs)  70 grams of protein (93% of minimum estimated needs)  Estimated Nutritional Needs:  Kcal:  1700-1900 Protein:  75-85 grams Fluid:  >/= 1.7 L/day  RN reports pt lethargic today and not alert enough to consume fluids, ensure, or consume food at meals. RN reports needing to suction food/liquids out of his mouth today. If pt continues to be lethargic, recommend placement of Cortrak NGT for adequate nutrition.   Nutrition Dx:  Inadequate oral intake related to dysphagia as evidenced by meal completion < 50%; ongoing  Goal:  Pt to meet >/= 90% of their estimated nutrition needs; not met  Intervention:   Continue calorie count--- RD to follow up Monday, 7/29 with results.   Continue Ensure Enlive po BID, each supplement provides 350 kcal and 20 grams of protein  Continue 30 ml Prostat po BID, each supplement provides 100 kcal and 15 grams of protein.   If Cortrak NGT placed, recommend Jevity 1.5 formula at 20 ml/hr and advanced by 10 ml every 4 hours to goal rate of 60 ml/hr x 20 hours (may hold TF for up to 4 hours for therapy) to provide 1800 kcal, 77 grams of protein, and 912 ml water.   Corrin Parker, MS, RD, LDN Pager # [phone] After hours/ weekend pager # [phone]

## 2017-11-24 NOTE — Progress Notes (Signed)
ANTICOAGULATION CONSULT NOTE - Follow up [place] for Eliquis Indication: atrial fibrillation and new stroke  Allergies  Allergen Reactions  . Warfarin And Related Other (See Comments)    Lost a lot of weight and also experienced numbness  . Atorvastatin Other (See Comments)    REACTION: MUSCLE PAIN  . Ezetimibe Other (See Comments)    REACTION: MUSCLE PAIN  . Diazepam Other (See Comments)    UNKNOWN, per pt  . Penicillins Other (See Comments)    UNKNOWN, per pt    Patient Measurements: Height: 5\' 7"  (170.2 cm) Weight: 180 lb 1.9 oz (81.7 kg) IBW/kg (Calculated) : 66.1  Vital Signs: Temp: 98.1 F (36.7 C) (07/26 0407) Temp Source: Oral (07/26 0407) BP: 122/64 (07/26 0759) Pulse Rate: 67 (07/26 0759)  Labs: Recent Labs    11/22/17 0715 11/24/17 0436  HGB 14.9 13.9  HCT 43.5 42.5  PLT 198 238  CREATININE 0.85  --     Estimated Creatinine Clearance: 63.8 mL/min (by C-G formula based on SCr of 0.85 mg/dL).   Medical History: Past Medical History:  Diagnosis Date  . Anxiety   . Back pain, chronic   . Complete heart block ([place]) 07/18/2014   Medtronic [place] model [id number] (serial number [id number])  singe lead PPM  . Esophageal obstruction due to food impaction 2017  . GERD (gastroesophageal reflux disease) 10/2002  . Hyperlipidemia 12/1994  . Hypertension   . Hypokalemia   . Lung collapse 03/1991   Fall  . Permanent atrial fibrillation ([name])    Refused coumadin therapy    Assessment: 88 YOM admitteed on 7/18 with acute stroke d/t afib, transferred to CIR this evening. Neuro recommended to start Eliquis on hospital D#5 if pt is stable.  Full dose aspirin and prophylactic lovenox d/c'd at transfer. CBC and SCr stable. No bleeding noted. Abrasions healing well.   Goal of Therapy:  Monitor platelets by anticoagulation protocol: Yes   Plan:  Continue Eliquis 5mg  PO BID Monitor CBC, renal function, and for signs/symptoms of bleeding   Thank  you for allowing [place] to participate in this patients care.   Jens Som, PharmD Please utilize Amion (under [place]) for appropriate number for your unit pharmacist. [phone] today until 3:30pm 11/24/2017 11:29 AM

## 2017-11-25 ENCOUNTER — Inpatient Hospital Stay (HOSPITAL_COMMUNITY): Payer: Medicare Other | Admitting: Physical Therapy

## 2017-11-25 ENCOUNTER — Inpatient Hospital Stay (HOSPITAL_COMMUNITY): Payer: Medicare Other

## 2017-11-25 DIAGNOSIS — E785 Hyperlipidemia, unspecified: Secondary | ICD-10-CM

## 2017-11-25 DIAGNOSIS — I2510 Atherosclerotic heart disease of native coronary artery without angina pectoris: Secondary | ICD-10-CM

## 2017-11-25 LAB — CBC
HCT: 26.5 % — ABNORMAL LOW (ref 39.0–52.0)
HCT: 28.4 % — ABNORMAL LOW (ref 39.0–52.0)
HEMATOCRIT: 23.8 % — AB (ref 39.0–52.0)
HEMOGLOBIN: 8 g/dL — AB (ref 13.0–17.0)
Hemoglobin: 8.9 g/dL — ABNORMAL LOW (ref 13.0–17.0)
Hemoglobin: 9.4 g/dL — ABNORMAL LOW (ref 13.0–17.0)
MCH: 30.3 pg (ref 26.0–34.0)
MCH: 30.3 pg (ref 26.0–34.0)
MCH: 29.2 pg (ref 26.0–34.0)
MCHC: 33.6 g/dL (ref 30.0–36.0)
MCHC: 33.6 g/dL (ref 30.0–36.0)
MCHC: 33.1 g/dL (ref 30.0–36.0)
MCV: 90.1 fL (ref 78.0–100.0)
MCV: 90.2 fL (ref 78.0–100.0)
MCV: 88.2 fL (ref 78.0–100.0)
PLATELETS: 186 10*3/uL (ref 150–400)
PLATELETS: 193 10*3/uL (ref 150–400)
Platelets: 176 10*3/uL (ref 150–400)
RBC: 2.94 MIL/uL — ABNORMAL LOW (ref 4.22–5.81)
RBC: 2.64 MIL/uL — ABNORMAL LOW (ref 4.22–5.81)
RBC: 3.22 MIL/uL — ABNORMAL LOW (ref 4.22–5.81)
RDW: 13.2 % (ref 11.5–15.5)
RDW: 13.5 % (ref 11.5–15.5)
RDW: 14.8 % (ref 11.5–15.5)
WBC: 25.6 10*3/uL — AB (ref 4.0–10.5)
WBC: 24.5 10*3/uL — ABNORMAL HIGH (ref 4.0–10.5)
WBC: 27.3 10*3/uL — ABNORMAL HIGH (ref 4.0–10.5)

## 2017-11-25 LAB — PREPARE RBC (CROSSMATCH)

## 2017-11-25 LAB — GLUCOSE, CAPILLARY: Glucose-Capillary: 120 mg/dL — ABNORMAL HIGH (ref 70–99)

## 2017-11-25 MED ORDER — ORAL CARE MOUTH RINSE
15.0000 mL | Freq: Two times a day (BID) | OROMUCOSAL | Status: DC
  Administered 2017-11-25 – 2017-12-04 (×18): 15 mL via OROMUCOSAL

## 2017-11-25 MED ORDER — METOPROLOL TARTRATE 5 MG/5ML IV SOLN
5.0000 mg | Freq: Two times a day (BID) | INTRAVENOUS | Status: DC
Start: 2017-11-25 — End: 2017-11-26
  Administered 2017-11-25: 5 mg via INTRAVENOUS
  Filled 2017-11-25 (×2): qty 5

## 2017-11-25 MED ORDER — CHLORHEXIDINE GLUCONATE 0.12 % MT SOLN
15.0000 mL | Freq: Two times a day (BID) | OROMUCOSAL | Status: DC
  Administered 2017-11-25 – 2017-12-04 (×18): 15 mL via OROMUCOSAL
  Filled 2017-11-25 (×15): qty 15

## 2017-11-25 MED ORDER — SODIUM CHLORIDE 0.45 % IV SOLN
INTRAVENOUS | Status: DC
  Administered 2017-11-25 – 2017-11-28 (×4): via INTRAVENOUS

## 2017-11-25 MED ORDER — METOPROLOL TARTRATE 25 MG PO TABS
25.0000 mg | ORAL_TABLET | Freq: Two times a day (BID) | ORAL | Status: DC
  Administered 2017-11-25: 25 mg via ORAL
  Filled 2017-11-25: qty 1

## 2017-11-25 MED ORDER — SODIUM CHLORIDE 0.9% IV SOLUTION
Freq: Once | INTRAVENOUS | Status: AC
  Administered 2017-11-25: 15:00:00 via INTRAVENOUS

## 2017-11-25 MED ORDER — HALOPERIDOL LACTATE 5 MG/ML IJ SOLN: 5.0000 mg | Freq: Once | INTRAMUSCULAR | Status: DC

## 2017-11-25 MED ORDER — CIPROFLOXACIN IN D5W 400 MG/200ML IV SOLN
400.0000 mg | Freq: Two times a day (BID) | INTRAVENOUS | Status: DC
  Administered 2017-11-25 – 2017-11-29 (×7): 400 mg via INTRAVENOUS
  Filled 2017-11-25 (×9): qty 200

## 2017-11-25 NOTE — Progress Notes (Signed)
Pharmacy Antibiotic Note  Dakota [name] is a 84 y.o. male admitted on 11/24/2017 with GI bleeding.  Pharmacy has been consulted for ciprofloxacin dosing for UTI.  Plan: Cipro 400mg  IV BID  Weight: 181 lb 3.5 oz (82.2 kg)  Temp (24hrs), Avg:98.3 F (36.8 C), Min:97.7 F (36.5 C), Max:98.9 F (37.2 C)  Recent Labs  Lab 11/19/17 0251 11/20/17 0439 11/21/17 0347 11/22/17 0715  11/24/17 1532 11/24/17 1834 11/24/17 2138 11/25/17 0628 11/25/17 1318  WBC 19.3* 16.9* 18.4* 16.7*   < > 22.0* 23.7* 26.3* 25.6* 24.5*  CREATININE 0.91 0.92 0.90 0.85  --  1.00  --   --   --   --    < > = values in this interval not displayed.    Estimated Creatinine Clearance: 54.4 mL/min (by C-G formula based on SCr of 1 mg/dL).    Allergies  Allergen Reactions  . Warfarin And Related Other (See Comments)    Lost a lot of weight and also experienced numbness  . Atorvastatin Other (See Comments)    REACTION: MUSCLE PAIN  . Ezetimibe Other (See Comments)    REACTION: MUSCLE PAIN  . Diazepam Other (See Comments)    UNKNOWN, per pt  . Penicillins Other (See Comments)    UNKNOWN, per pt    Antimicrobials this admission: 7/25 Macrobid >> 7/26 7/26 Bactrim x1 7/27 Ciprofloxacin >>  Dose adjustments this admission: none  Microbiology results: 7/24 UCx: Enterobacter cloacae (R: cefazolin)  7/18 MRSA PCR: neg  Thank you for allowing pharmacy to be a part of this patient's care.  Arrie Senate, PharmD, BCPS Clinical Pharmacist [phone] Please check AMION for all [place] numbers 11/25/2017

## 2017-11-25 NOTE — Progress Notes (Signed)
Inpatient Rehabilitation Admissions Coordinator  Noted pt readmitted to acute hospital from inpatient rehabilitation. I will follow his progress. Please reorder PT, OT, and SLP when appropriate.   Danne Baxter, RN, MSN Rehab Admissions Coordinator [phone] 11/25/2017 11:59 AM

## 2017-11-25 NOTE — Progress Notes (Signed)
[facility] PROGRESS NOTE Subjective Patient had large maroonish stool last night.  His last dose of Eliquis was yesterday morning.  Son is been with him for a while and notes no further bleeding in the past several hours morning hemoglobin pending  Objective: Vital signs in last 24 hours: Temp:  [98 F (36.7 C)-99.4 F (37.4 C)] 98.9 F (37.2 C) (07/27 0424) Pulse Rate:  [46-122] 110 (07/27 0424) Resp:  [16-31] 18 (07/27 0424) BP: (73-165)/(41-118) 123/59 (07/27 0424) SpO2:  [84 %-100 %] 97 % (07/27 0424) Weight:  [82.2 kg (181 lb 3.5 oz)] 82.2 kg (181 lb 3.5 oz) (07/27 0500) Last BM Date: 11/24/17  Intake/Output from previous day: 07/26 0701 - 07/27 0700 In: 1864.3 [I.V.:474.9; Blood:418; IV Piggyback:971.4] Out: -  Intake/Output this shift: Total I/O In: 1864.3 [I.V.:474.9; Blood:418; IV Piggyback:971.4] Out: -   PE: Patient more awake and alert but talking inappropriately General--no obvious distress  Abdomen--nondistended nontender good bowel sounds  Lab Results: Recent Labs    11/22/17 0715 11/24/17 0436 11/24/17 1532 11/24/17 1834 11/24/17 2138  WBC 16.7* 15.2* 22.0* 23.7* 26.3*  HGB 14.9 13.9 11.6* 9.3* 8.4*  HCT 43.5 42.5 35.0* 27.8* 25.5*  PLT 198 238 245 239 237   BMET Recent Labs    11/22/17 0715 11/24/17 1532  NA 137 138  K 3.9 4.8  CL 106 106  CO2 24 22  CREATININE 0.85 1.00   LFT Recent Labs    11/22/17 0715  PROT 6.2*  AST 24  ALT 31  ALKPHOS 49  BILITOT 1.4*   PT/INR No results for input(s): LABPROT, INR in the last 72 hours. PANCREAS No results for input(s): LIPASE in the last 72 hours.       Studies/Results: No results found.  Medications: I have reviewed the patient's current medications.  Assessment:   1.  GI bleed.  Complicated by anticoagulation she has had his last dose yesterday.  Has been taking Eliquis and hopefully after 2 days the effect should be gone. 2.  Recent stroke with behavioral  abnormalities   Plan: We will continue to follow.  Think I would start with upper endoscopy would prefer to wait until the Eliquis is been out of his system at least 2 days.  This would minimize the risk from the procedure.  We will tentatively plan on Monday morning but could go ahead tomorrow if the bleeding continues as the Eliquis effect subside.  Discussed with son.   Nancy Fetter 11/25/2017, 6:44 AM  This note was created using voice recognition software. Minor errors may Have occurred unintentionally.  Pager: [phone] If no answer or after hours call [phone]

## 2017-11-25 NOTE — Progress Notes (Addendum)
PROGRESS NOTE  [name]  ZMO:[id number] DOB: April 17, 1929 DOA: 11/24/2017 PCP: Tonia Ghent, MD   Brief Narrative: Dakota [name] is an 84 y.o. male with a history of PAF, CHB s/p PPM, GERD w/schatzki's ring, HTN, and recent admission for right MCA CVA s/p thrombectomy, discharged to CIR on eliquis who developed melena/acute GI bleeding with hypotension, readmitted to [facility] 7/26. He was given IV fluids with improvement in BP, requiring 1u PRBCs for hemoglobin continuing to drop in setting of melena despite holding eliquis. GI was consulted and is planning EGD.   Assessment & Plan: Principal Problem:   Hypovolemic shock ([place]) Active Problems:   HLD (hyperlipidemia)   Essential hypertension, benign   CAD, NATIVE VESSEL   Atrial fibrillation ([name])   CHB (complete heart block) - s/p MDT 1 lead PPM   Cerebrovascular accident (CVA) due to thrombosis of right middle cerebral artery ([name])   GIB (gastrointestinal bleeding)  Acute blood loss anemia: Hgb down precipitously, now to 8g/dl - Monitor serial CBCs - Transfuse 1u now since actively bleeding with hgb 8 in CAD pt.  Hypotension, Hx HTN: No evidence of end-organ damage, BP improved once volume status repleted - Needs rate control; on home coreg, but will transition to metoprolol as this may cause a little less hypotension. - Give transfusions and IVFs as above - Keep in SDU for now  Melena:  - GI consulted, on PPI gtt, EGD planned in next 24-48hrs. - Diet per GI: NPO. Needs oral care  Right CVA with residual deficits: s/p thrombectomy.  - Holding eliquis. Continue statin.  - PT/OT/SLP to be reordered once work up complete with plans to return to CIR.  Acute delirium:  - Continue seroquel qHS, may increase dose as there's been no sedation though this wasn't given last night. - On depakote, continued. - Xanax BID prn continued. - Delirium precautions  CAD: Fortunately no chest pain - No blood thinners. Restart BB.  Atrial  fibrillation, complete heart block:  - Restarting beta blocker (metoprolol substituted for coreg) - Monitor K, Mg, keep >4 and >2 respectively. - Holding eliquis as above  Hyperlipidemia:  - Statin  Leukocytosis, UTI: Don't believe hypotension is due to sepsis. Started on bactrim at [facility] though has K beginning to elevate, Cr rising. Will give cipro for now, transition to bactrim per susceptibility data (PCN allergy) once cleared to take po.   Smokeless tobacco use: ?nicotine withdrawal worsening behavioral abnormalities.  - Continue low dose nicotine patch  DVT prophylaxis: SCDs Code Status: Full Family Communication: Son at bedside Disposition Plan: CIR once work up complete, pt's hgb stable and bleeding subsided  Consultants:   GI  Procedures:   EGD planned  Antimicrobials:  None   Subjective: Has been having maroon/dark stools overnight. No syncope, dyspnea, chest pain, or changes in deficits. Having some delirium which he's been having per his son at the bedside.  Objective: Vitals:   11/25/17 0811 11/25/17 1111 11/25/17 1205 11/25/17 1211  BP: 117/72 (!) 126/93 (!) 111/98 96/70  Pulse: (!) 111 (!) 104 94   Resp: (!) 28  20 19   Temp: 98.3 F (36.8 C)   97.7 F (36.5 C)  TempSrc: Oral   Oral  SpO2: 100%  98%   Weight:        Intake/Output Summary (Last 24 hours) at 11/25/2017 1343 Last data filed at 11/25/2017 0420 Gross per 24 hour  Intake 1864.28 ml  Output -  Net 1864.28 ml   Filed  Weights   11/25/17 0500  Weight: 82.2 kg (181 lb 3.5 oz)    Gen: Elderly male in no distress Pulm: Non-labored breathing. Clear to auscultation bilaterally.  CV: Irreg, rate ~110. No murmur, rub, or gallop. No JVD, no pedal edema. GI: Abdomen soft, non-tender, non-distended, with normoactive bowel sounds. No organomegaly or masses felt. Ext: Warm, no deformities Skin: No rashes, lesions or ulcers Neuro: Alert, slightly confused. Right gaze preference, left neglect and  some left hemiparesis. Psych: Judgement and insight appear impaired. Mood & affect appropriate.   Data Reviewed: I have personally reviewed following labs and imaging studies  CBC: Recent Labs  Lab 11/22/17 0715 11/24/17 0436 11/24/17 1532 11/24/17 1834 11/24/17 2138 11/25/17 0628  WBC 16.7* 15.2* 22.0* 23.7* 26.3* 25.6*  NEUTROABS 11.3* 9.8*  --   --   --   --   HGB 14.9 13.9 11.6* 9.3* 8.4* 8.9*  HCT 43.5 42.5 35.0* 27.8* 25.5* 26.5*  MCV 88.1 90.8 90.4 91.4 90.7 90.1  PLT 198 238 245 239 237 408   Basic Metabolic Panel: Recent Labs  Lab 11/19/17 0251 11/20/17 0439 11/21/17 0347 11/22/17 0715 11/24/17 1532  NA 136 134* 136 137 138  K 4.0 4.0 3.7 3.9 4.8  CL 106 103 104 106 106  CO2 24 24 23 24 22   GLUCOSE 108* 129* 123* 122* 124*  BUN 25* 26* 33* 27* 54*  CREATININE 0.91 0.92 0.90 0.85 1.00  CALCIUM 8.8* 9.1 9.1 8.8* 8.5*   GFR: Estimated Creatinine Clearance: 54.4 mL/min (by C-G formula based on SCr of 1 mg/dL). Liver Function Tests: Recent Labs  Lab 11/22/17 0715  AST 24  ALT 31  ALKPHOS 49  BILITOT 1.4*  PROT 6.2*  ALBUMIN 2.6*   No results for input(s): LIPASE, AMYLASE in the last 168 hours. No results for input(s): AMMONIA in the last 168 hours. Coagulation Profile: No results for input(s): INR, PROTIME in the last 168 hours. Cardiac Enzymes: No results for input(s): CKTOTAL, CKMB, CKMBINDEX, TROPONINI in the last 168 hours. BNP (last 3 results) No results for input(s): PROBNP in the last 8760 hours. HbA1C: No results for input(s): HGBA1C in the last 72 hours. CBG: Recent Labs  Lab 11/25/17 0750  GLUCAP 120*   Lipid Profile: No results for input(s): CHOL, HDL, LDLCALC, TRIG, CHOLHDL, LDLDIRECT in the last 72 hours. Thyroid Function Tests: No results for input(s): TSH, T4TOTAL, FREET4, T3FREE, THYROIDAB in the last 72 hours. Anemia Panel: No results for input(s): VITAMINB12, FOLATE, FERRITIN, TIBC, IRON, RETICCTPCT in the last 72  hours. Urine analysis:    Component Value Date/Time   COLORURINE YELLOW 11/21/2017 1810   APPEARANCEUR CLEAR 11/21/2017 1810   LABSPEC 1.017 11/21/2017 1810   PHURINE 5.0 11/21/2017 1810   GLUCOSEU NEGATIVE 11/21/2017 1810   HGBUR LARGE (A) 11/21/2017 1810   BILIRUBINUR NEGATIVE 11/21/2017 1810   KETONESUR NEGATIVE 11/21/2017 1810   PROTEINUR 30 (A) 11/21/2017 1810   UROBILINOGEN 1.0 12/29/2014 0816   NITRITE NEGATIVE 11/21/2017 1810   LEUKOCYTESUR NEGATIVE 11/21/2017 1810   Recent Results (from the past 240 hour(s))  MRSA PCR Screening     Status: None   Collection Time: 11/16/17  6:59 PM  Result Value Ref Range Status   MRSA by PCR NEGATIVE NEGATIVE Final    Comment:        The GeneXpert MRSA Assay (FDA approved for NASAL specimens only), is one component of a comprehensive MRSA colonization surveillance program. It is not intended to diagnose MRSA infection  nor to guide or monitor treatment for MRSA infections. Performed at [facility], [place] [address], [place], [place] [address]   Culture, Urine     Status: Abnormal   Collection Time: 11/22/17  3:10 AM  Result Value Ref Range Status   Specimen Description URINE, CLEAN CATCH  Final   Special Requests   Final    NONE Performed at [facility], [place] [address] [place], [place] [address]    Culture >=100,000 COLONIES/mL ENTEROBACTER CLOACAE (A)  Final   Report Status 11/24/2017 FINAL  Final   Organism ID, Bacteria ENTEROBACTER CLOACAE (A)  Final      Susceptibility   Enterobacter cloacae - MIC*    CEFAZOLIN >=64 RESISTANT Resistant     CEFTRIAXONE <=1 SENSITIVE Sensitive     CIPROFLOXACIN <=0.25 SENSITIVE Sensitive     GENTAMICIN <=1 SENSITIVE Sensitive     IMIPENEM <=0.25 SENSITIVE Sensitive     NITROFURANTOIN 64 INTERMEDIATE Intermediate     TRIMETH/SULFA <=20 SENSITIVE Sensitive     PIP/TAZO <=4 SENSITIVE Sensitive     * >=100,000 COLONIES/mL ENTEROBACTER CLOACAE      Radiology Studies: No  results found.  Scheduled Meds: . sodium chloride   Intravenous Once  . chlorhexidine  15 mL Mouth Rinse BID  . divalproex  250 mg Oral Q8H  . famotidine  20 mg Oral BID  . mouth rinse  15 mL Mouth Rinse q12n4p  . metoprolol tartrate  25 mg Oral BID  . nicotine  7 mg Transdermal Daily  . [START ON 11/28/2017] pantoprazole  40 mg Intravenous Q12H  . pravastatin  20 mg Oral q1800  . QUEtiapine  25 mg Oral QHS  . sodium chloride flush  3 mL Intravenous Q12H   Continuous Infusions: . 0.9 % NaCl with KCl 20 mEq / L 100 mL/hr at 11/25/17 0603  . pantoprozole (PROTONIX) infusion 8 mg/hr (11/25/17 0610)     LOS: 1 day   Time spent: 35 minutes.  Patrecia Pour, MD [facility] [email or web address] Password [facility] 11/25/2017, 1:43 PM

## 2017-11-26 ENCOUNTER — Inpatient Hospital Stay (HOSPITAL_COMMUNITY): Payer: Medicare Other

## 2017-11-26 LAB — CBC
HCT: 27.8 % — ABNORMAL LOW (ref 39.0–52.0)
HCT: 26.8 % — ABNORMAL LOW (ref 39.0–52.0)
Hemoglobin: 9.2 g/dL — ABNORMAL LOW (ref 13.0–17.0)
Hemoglobin: 8.9 g/dL — ABNORMAL LOW (ref 13.0–17.0)
MCH: 29.2 pg (ref 26.0–34.0)
MCH: 29.4 pg (ref 26.0–34.0)
MCHC: 33.1 g/dL (ref 30.0–36.0)
MCHC: 33.2 g/dL (ref 30.0–36.0)
MCV: 88.3 fL (ref 78.0–100.0)
MCV: 88.4 fL (ref 78.0–100.0)
PLATELETS: 180 10*3/uL (ref 150–400)
PLATELETS: 182 10*3/uL (ref 150–400)
RBC: 3.15 MIL/uL — AB (ref 4.22–5.81)
RBC: 3.03 MIL/uL — AB (ref 4.22–5.81)
RDW: 15.1 % (ref 11.5–15.5)
RDW: 14.9 % (ref 11.5–15.5)
WBC: 19.9 10*3/uL — AB (ref 4.0–10.5)
WBC: 20.2 10*3/uL — AB (ref 4.0–10.5)

## 2017-11-26 LAB — TYPE AND SCREEN
ABO/RH(D): A POS
Antibody Screen: NEGATIVE
UNIT DIVISION: 0
UNIT DIVISION: 0

## 2017-11-26 LAB — BASIC METABOLIC PANEL
ANION GAP: 5 (ref 5–15)
BUN: 24 mg/dL — ABNORMAL HIGH (ref 8–23)
CALCIUM: 8.3 mg/dL — AB (ref 8.9–10.3)
CO2: 24 mmol/L (ref 22–32)
Chloride: 109 mmol/L (ref 98–111)
Creatinine, Ser: 0.94 mg/dL (ref 0.61–1.24)
GLUCOSE: 120 mg/dL — AB (ref 70–99)
Potassium: 4.1 mmol/L (ref 3.5–5.1)
SODIUM: 138 mmol/L (ref 135–145)

## 2017-11-26 LAB — BPAM RBC
BLOOD PRODUCT EXPIRATION DATE: 201908142359
Blood Product Expiration Date: 201908142359
ISSUE DATE / TIME: 201907270043
ISSUE DATE / TIME: 201907271447
UNIT TYPE AND RH: 6200
Unit Type and Rh: 6200

## 2017-11-26 LAB — GLUCOSE, CAPILLARY: GLUCOSE-CAPILLARY: 107 mg/dL — AB (ref 70–99)

## 2017-11-26 MED ORDER — METOPROLOL TARTRATE 5 MG/5ML IV SOLN
2.5000 mg | Freq: Three times a day (TID) | INTRAVENOUS | Status: DC
Start: 2017-11-26 — End: 2017-11-28
  Administered 2017-11-26 – 2017-11-28 (×5): 2.5 mg via INTRAVENOUS
  Filled 2017-11-26 (×5): qty 5

## 2017-11-26 MED ORDER — SODIUM CHLORIDE 0.9 % IV BOLUS
500.0000 mL | Freq: Once | INTRAVENOUS | Status: AC
  Administered 2017-11-26: 500 mL via INTRAVENOUS

## 2017-11-26 MED ORDER — SODIUM CHLORIDE 0.9 % IV SOLN
510.0000 mg | Freq: Once | INTRAVENOUS | Status: DC
  Filled 2017-11-26: qty 17

## 2017-11-26 NOTE — Progress Notes (Signed)
PROGRESS NOTE  Dakota [name]  VEL:[id number] DOB: 17-Apr-1929 DOA: 11/24/2017 PCP: Tonia Ghent, MD   Brief Narrative: Dakota [name] is an 84 y.o. male with a history of PAF, CHB s/p PPM, GERD w/schatzki's ring, HTN, and recent admission for right MCA CVA s/p thrombectomy, discharged to CIR on eliquis who developed melena/acute GI bleeding with hypotension, readmitted to [facility] 7/26. He was given IV fluids with improvement in BP, requiring 1u PRBCs for hemoglobin continuing to drop in setting of melena despite holding eliquis. GI was consulted and is planning EGD 7/29. Had large volume red blood per rectum this AM.  Assessment & Plan: Principal Problem:   Hypovolemic shock ([place]) Active Problems:   HLD (hyperlipidemia)   Essential hypertension, benign   CAD, NATIVE VESSEL   Atrial fibrillation ([name])   CHB (complete heart block) - s/p MDT 1 lead PPM   Cerebrovascular accident (CVA) due to thrombosis of right middle cerebral artery ([place])   GIB (gastrointestinal bleeding)  Acute blood loss anemia: s/p 1u PRBCs on 7/26, 7/27. Hgb 9.2 this AM but had additional red blood per rectum.  - CBC now, transfuse if another episode of bleeding or hgb 8 or lower in CAD pt.   Hypotension, Hx HTN: No evidence of end-organ damage, BP improved once volume status repleted. - Continue IV metoprolol (replaced coreg po), hold for MAP < 70.  Melena:  - GI consulted, on PPI gtt, EGD planned tomorrow.  - NOW having red bleeding per rectum, may require colonoscopy. Will need close vital sign monitoring in SDU. - Holding anticoagulation - Diet per GI: NPO. Needs oral care, d/w RN.  Right CVA with residual deficits: s/p thrombectomy.  - Holding eliquis. Continue statin.  - PT/OT/SLP to be reordered once work up complete with plans to return to CIR.  Acute delirium:  - Continue seroquel qHS, stopped AM dose due to somnolence. Currently unable to take po medications, so will restart those once able. - On  depakote, continued. - Xanax BID prn continued. - Delirium precautions  CAD: Fortunately no chest pain - No blood thinners. Restart BB.  Atrial fibrillation, complete heart block:  - Restarting beta blocker (metoprolol substituted for coreg) - Monitor K, Mg, keep >4 and >2 respectively. - Holding eliquis as above  Hyperlipidemia:  - Statin when taking po  Leukocytosis, UTI: Don't believe hypotension is due to sepsis. Started on bactrim at [facility] though has K beginning to elevate, Cr rising.  - Will give cipro for now, transition to bactrim per susceptibility data (PCN allergy) once cleared to take po.   Smokeless tobacco use: ?nicotine withdrawal worsening behavioral abnormalities.  - Continue low dose nicotine patch  DVT prophylaxis: SCDs Code Status: Full Family Communication: Son at bedside Disposition Plan: plan to return to CIR once work up complete, pt's hgb stable and bleeding subsided  Consultants:   GI  Procedures:   EGD 11/27/2017  Antimicrobials:  None   Subjective: Had RBPR reportedly large volume while getting bed bath today. No complaints from the patient though he is unreliable historian. Not taking po at all. Had agitation last night requiring haldol.   Objective: Vitals:   11/26/17 0432 11/26/17 0609 11/26/17 0611 11/26/17 0827  BP: 130/67  125/70 112/73  Pulse: 99  97 85  Resp: 20  20 (!) 21  Temp:    99.3 F (37.4 C)  TempSrc:    Axillary  SpO2: 99%  100% 99%  Weight:  81.5 kg (179 lb 10.8  oz)      Intake/Output Summary (Last 24 hours) at 11/26/2017 1142 Last data filed at 11/26/2017 1047 Gross per 24 hour  Intake 3689.94 ml  Output 1050 ml  Net 2639.94 ml   Filed Weights   11/25/17 0500 11/26/17 0609  Weight: 82.2 kg (181 lb 3.5 oz) 81.5 kg (179 lb 10.8 oz)   Gen: Elderly male in no distress Pulm: Nonlabored breathing room air. Clear. Mouth wide open. CV: Irreg w/rate in 80's. MAP around 70. No murmur, rub, or gallop. No JVD, no  dependent edema. GI: Abdomen soft, non-tender, non-distended, with normoactive bowel sounds.  Ext: Warm, no deformities Skin: No rashes, lesions or ulcers on visualized skin.  Neuro: Sleeping but rousable, not oriented, left side neglect with right gaze preference though can move left side without coordination. No new deficits noted. Psych: Judgement and insight appear impaired. No current hallucinations.  Data Reviewed: I have personally reviewed following labs and imaging studies  CBC: Recent Labs  Lab 11/22/17 0715 11/24/17 0436  11/24/17 2138 11/25/17 0628 11/25/17 1318 11/25/17 2058 11/26/17 0705  WBC 16.7* 15.2*   < > 26.3* 25.6* 24.5* 27.3* 19.9*  NEUTROABS 11.3* 9.8*  --   --   --   --   --   --   HGB 14.9 13.9   < > 8.4* 8.9* 8.0* 9.4* 9.2*  HCT 43.5 42.5   < > 25.5* 26.5* 23.8* 28.4* 27.8*  MCV 88.1 90.8   < > 90.7 90.1 90.2 88.2 88.3  PLT 198 238   < > 237 186 176 193 180   < > = values in this interval not displayed.   Basic Metabolic Panel: Recent Labs  Lab 11/20/17 0439 11/21/17 0347 11/22/17 0715 11/24/17 1532 11/26/17 0705  NA 134* 136 137 138 138  K 4.0 3.7 3.9 4.8 4.1  CL 103 104 106 106 109  CO2 24 23 24 22 24   GLUCOSE 129* 123* 122* 124* 120*  BUN 26* 33* 27* 54* 24*  CREATININE 0.92 0.90 0.85 1.00 0.94  CALCIUM 9.1 9.1 8.8* 8.5* 8.3*   GFR: Estimated Creatinine Clearance: 57.7 mL/min (by C-G formula based on SCr of 0.94 mg/dL). Liver Function Tests: Recent Labs  Lab 11/22/17 0715  AST 24  ALT 31  ALKPHOS 49  BILITOT 1.4*  PROT 6.2*  ALBUMIN 2.6*   No results for input(s): LIPASE, AMYLASE in the last 168 hours. No results for input(s): AMMONIA in the last 168 hours. Coagulation Profile: No results for input(s): INR, PROTIME in the last 168 hours. Cardiac Enzymes: No results for input(s): CKTOTAL, CKMB, CKMBINDEX, TROPONINI in the last 168 hours. BNP (last 3 results) No results for input(s): PROBNP in the last 8760 hours. HbA1C: No  results for input(s): HGBA1C in the last 72 hours. CBG: Recent Labs  Lab 11/25/17 0750 11/26/17 0758  GLUCAP 120* 107*   Lipid Profile: No results for input(s): CHOL, HDL, LDLCALC, TRIG, CHOLHDL, LDLDIRECT in the last 72 hours. Thyroid Function Tests: No results for input(s): TSH, T4TOTAL, FREET4, T3FREE, THYROIDAB in the last 72 hours. Anemia Panel: No results for input(s): VITAMINB12, FOLATE, FERRITIN, TIBC, IRON, RETICCTPCT in the last 72 hours. Urine analysis:    Component Value Date/Time   COLORURINE YELLOW 11/21/2017 1810   APPEARANCEUR CLEAR 11/21/2017 1810   LABSPEC 1.017 11/21/2017 1810   PHURINE 5.0 11/21/2017 1810   GLUCOSEU NEGATIVE 11/21/2017 1810   HGBUR LARGE (A) 11/21/2017 1810   BILIRUBINUR NEGATIVE 11/21/2017 1810   KETONESUR  NEGATIVE 11/21/2017 1810   PROTEINUR 30 (A) 11/21/2017 1810   UROBILINOGEN 1.0 12/29/2014 0816   NITRITE NEGATIVE 11/21/2017 1810   LEUKOCYTESUR NEGATIVE 11/21/2017 1810   Recent Results (from the past 240 hour(s))  MRSA PCR Screening     Status: None   Collection Time: 11/16/17  6:59 PM  Result Value Ref Range Status   MRSA by PCR NEGATIVE NEGATIVE Final    Comment:        The GeneXpert MRSA Assay (FDA approved for NASAL specimens only), is one component of a comprehensive MRSA colonization surveillance program. It is not intended to diagnose MRSA infection nor to guide or monitor treatment for MRSA infections. Performed at [facility], [place] [address], [place], [place] [address]   Culture, Urine     Status: Abnormal   Collection Time: 11/22/17  3:10 AM  Result Value Ref Range Status   Specimen Description URINE, CLEAN CATCH  Final   Special Requests   Final    NONE Performed at [facility], [place] [address] [place], [place] [address]    Culture >=100,000 COLONIES/mL ENTEROBACTER CLOACAE (A)  Final   Report Status 11/24/2017 FINAL  Final   Organism ID, Bacteria ENTEROBACTER CLOACAE (A)  Final       Susceptibility   Enterobacter cloacae - MIC*    CEFAZOLIN >=64 RESISTANT Resistant     CEFTRIAXONE <=1 SENSITIVE Sensitive     CIPROFLOXACIN <=0.25 SENSITIVE Sensitive     GENTAMICIN <=1 SENSITIVE Sensitive     IMIPENEM <=0.25 SENSITIVE Sensitive     NITROFURANTOIN 64 INTERMEDIATE Intermediate     TRIMETH/SULFA <=20 SENSITIVE Sensitive     PIP/TAZO <=4 SENSITIVE Sensitive     * >=100,000 COLONIES/mL ENTEROBACTER CLOACAE      Radiology Studies: No results found.  Scheduled Meds: . chlorhexidine  15 mL Mouth Rinse BID  . divalproex  250 mg Oral Q8H  . famotidine  20 mg Oral BID  . haloperidol lactate  5 mg Intravenous Once  . mouth rinse  15 mL Mouth Rinse q12n4p  . metoprolol tartrate  5 mg Intravenous Q12H  . nicotine  7 mg Transdermal Daily  . [START ON 11/28/2017] pantoprazole  40 mg Intravenous Q12H  . pravastatin  20 mg Oral q1800  . QUEtiapine  25 mg Oral QHS  . sodium chloride flush  3 mL Intravenous Q12H   Continuous Infusions: . sodium chloride 110 mL/hr at 11/26/17 0000  . ciprofloxacin 400 mg (11/26/17 0521)  . pantoprozole (PROTONIX) infusion 8 mg/hr (11/26/17 0526)     LOS: 2 days   Time spent: 35 minutes.  Patrecia Pour, MD [facility] [email or web address] Password [facility] 11/26/2017, 11:42 AM

## 2017-11-26 NOTE — Progress Notes (Signed)
[facility] PROGRESS NOTE Subjective Patient without gross bleeding.  Is now being treated for UTI.Has now been off anticoagulation a full 2 days.  Objective: Vital signs in last 24 hours: Temp:  [97.7 F (36.5 C)-98.3 F (36.8 C)] 98 F (36.7 C) (07/28 0022) Pulse Rate:  [91-111] 99 (07/28 0432) Resp:  [15-29] 20 (07/28 0432) BP: (95-140)/(58-98) 130/67 (07/28 0432) SpO2:  [92 %-100 %] 99 % (07/28 0432) Weight:  [81.5 kg (179 lb 10.8 oz)] 81.5 kg (179 lb 10.8 oz) (07/28 0609) Last BM Date: 11/24/17  Intake/Output from previous day: 07/27 0701 - 07/28 0700 In: 3689.9 [I.V.:3174.9; Blood:315; IV Piggyback:200] Out: 100 [Urine:100] Intake/Output this shift: Total I/O In: 1570 [I.V.:1370; IV Piggyback:200] Out: -   PE: General--patient with mittens on his hands.  He is asleep and barely arousable has been sedated  Abdomen--soft and nontender  Lab Results: Recent Labs    11/24/17 1834 11/24/17 2138 11/25/17 0628 11/25/17 1318 11/25/17 2058  WBC 23.7* 26.3* 25.6* 24.5* 27.3*  HGB 9.3* 8.4* 8.9* 8.0* 9.4*  HCT 27.8* 25.5* 26.5* 23.8* 28.4*  PLT 239 237 186 176 193   BMET Recent Labs    11/24/17 1532  NA 138  K 4.8  CL 106  CO2 22  CREATININE 1.00   LFT No results for input(s): PROT, AST, ALT, ALKPHOS, BILITOT, BILIDIR, IBILI in the last 72 hours. PT/INR No results for input(s): LABPROT, INR in the last 72 hours. PANCREAS No results for input(s): LIPASE in the last 72 hours.       Studies/Results: No results found.  Medications: I have reviewed the patient's current medications.  Assessment:   1.  GI bleed.  Patient has been off of his medications now for a full 2 days.  I think it would be reasonable to go ahead with an upper endoscopy.  If this is negative we would need to discuss whether or not it would be appropriate to do colonoscopy with his son.  Yesterday he had did have a conversation with his son about EGD told him we would like to  wait until Monday morning for a full bit more time off of the anticoagulation   Plan: We will set him up for EGD in the morning.  Time to be determined.  Have discussed this with his son will try to obtain the permit from his son.   Nancy Fetter 11/26/2017, 6:28 AM  This note was created using voice recognition software. Minor errors may Have occurred unintentionally.  Pager: [phone] If no answer or after hours call [phone]

## 2017-11-26 NOTE — Progress Notes (Addendum)
SLP Cancellation Note  Patient Details Name: Dakota [name] MRN: [id number] DOB: 03-29-1929   Cancelled treatment:       Reason Eval/Treat Not Completed: Medical issues which prohibited therapy. Per chart, pt NPO pending EGD, also sedated this morning. Will f/u for swallow eval as able.   Germain Osgood 11/26/2017, 7:47 AM  Germain Osgood, M.A. CCC-SLP [phone]

## 2017-11-27 ENCOUNTER — Inpatient Hospital Stay (HOSPITAL_COMMUNITY): Payer: Medicare Other | Admitting: Anesthesiology

## 2017-11-27 ENCOUNTER — Inpatient Hospital Stay (HOSPITAL_COMMUNITY): Payer: Medicare Other | Admitting: Occupational Therapy

## 2017-11-27 ENCOUNTER — Encounter (HOSPITAL_COMMUNITY): Payer: Self-pay | Admitting: Anesthesiology

## 2017-11-27 ENCOUNTER — Inpatient Hospital Stay (HOSPITAL_COMMUNITY): Payer: Medicare Other | Admitting: Speech Pathology

## 2017-11-27 ENCOUNTER — Encounter (HOSPITAL_COMMUNITY)
Admission: AD | Disposition: A | Discharge status: Rehabilitation Facility | Payer: Self-pay | Source: Ambulatory Visit | Attending: Family Medicine

## 2017-11-27 ENCOUNTER — Inpatient Hospital Stay (HOSPITAL_COMMUNITY): Payer: Medicare Other | Admitting: Physical Therapy

## 2017-11-27 HISTORY — PX: BIOPSY: SHX5522

## 2017-11-27 HISTORY — PX: ESOPHAGOGASTRODUODENOSCOPY: SHX5428

## 2017-11-27 LAB — CBC
HCT: 25.8 % — ABNORMAL LOW (ref 39.0–52.0)
Hemoglobin: 8.8 g/dL — ABNORMAL LOW (ref 13.0–17.0)
MCH: 30 pg (ref 26.0–34.0)
MCHC: 34.1 g/dL (ref 30.0–36.0)
MCV: 88.1 fL (ref 78.0–100.0)
PLATELETS: 180 10*3/uL (ref 150–400)
RBC: 2.93 MIL/uL — ABNORMAL LOW (ref 4.22–5.81)
RDW: 14.4 % (ref 11.5–15.5)
WBC: 14.9 10*3/uL — AB (ref 4.0–10.5)

## 2017-11-27 LAB — BASIC METABOLIC PANEL
Anion gap: 8 (ref 5–15)
BUN: 12 mg/dL (ref 8–23)
CO2: 24 mmol/L (ref 22–32)
CREATININE: 0.87 mg/dL (ref 0.61–1.24)
Calcium: 8.2 mg/dL — ABNORMAL LOW (ref 8.9–10.3)
Chloride: 104 mmol/L (ref 98–111)
GFR calc non Af Amer: >60 mL/min (ref >60)
Glucose, Bld: 104 mg/dL — ABNORMAL HIGH (ref 70–99)
Potassium: 3.5 mmol/L (ref 3.5–5.1)
SODIUM: 136 mmol/L (ref 135–145)

## 2017-11-27 LAB — GLUCOSE, CAPILLARY: GLUCOSE-CAPILLARY: 94 mg/dL (ref 70–99)

## 2017-11-27 SURGERY — EGD (ESOPHAGOGASTRODUODENOSCOPY)
Anesthesia: Monitor Anesthesia Care

## 2017-11-27 MED ORDER — SODIUM CHLORIDE 0.9 % IV SOLN: INTRAVENOUS | Status: DC

## 2017-11-27 MED ORDER — HEPARIN (PORCINE) IN NACL 100-0.45 UNIT/ML-% IJ SOLN
1200.0000 [IU]/h | INTRAMUSCULAR | Status: DC
  Administered 2017-11-27: 1000 [IU]/h via INTRAVENOUS
  Filled 2017-11-27: qty 250

## 2017-11-27 MED ORDER — SODIUM CHLORIDE 0.9 % IV SOLN
INTRAVENOUS | Status: DC | PRN
  Administered 2017-11-27: 11:00:00 via INTRAVENOUS

## 2017-11-27 MED ORDER — PROPOFOL 10 MG/ML IV BOLUS
INTRAVENOUS | Status: DC | PRN
  Administered 2017-11-27: 30 mg via INTRAVENOUS
  Administered 2017-11-27: 10 mg via INTRAVENOUS

## 2017-11-27 MED ORDER — PROPOFOL 500 MG/50ML IV EMUL
INTRAVENOUS | Status: DC | PRN
  Administered 2017-11-27: 100 ug/kg/min via INTRAVENOUS

## 2017-11-27 MED ORDER — NICOTINE 14 MG/24HR TD PT24: 14.0000 mg | MEDICATED_PATCH | Freq: Every day | TRANSDERMAL | Status: DC

## 2017-11-27 NOTE — Op Note (Signed)
[facility] Patient Name: Dakota [name] Procedure Date : 11/27/2017 MRN: [id number] Attending MD: Nancy Fetter Dr., MD Date of Birth: 1928-10-22 CSN: [id number] Age: 84 Admit Type: Inpatient Procedure:                Upper GI endoscopy Indications:              Hematemesis Providers:                Jeneen Rinks L. Amador Braddy Dr., MD, Cleda Daub, RN, Tinnie Gens, Technician, Trixie Deis, CRNA Referring MD:              Medicines:                Monitored Anesthesia Care Complications:            No immediate complications. Estimated Blood Loss:     Estimated blood loss was minimal. Procedure:                Pre-Anesthesia Assessment:                           - Prior to the procedure, a History and Physical                            was performed, and patient medications and                            allergies were reviewed. The patient's tolerance of                            previous anesthesia was also reviewed. The risks                            and benefits of the procedure and the sedation                            options and risks were discussed with the patient.                            All questions were answered, and informed consent                            was obtained. Prior Anticoagulants: The patient has                            taken Eliquis (apixaban), last dose was 3 days                            prior to procedure. ASA Grade Assessment: III - A                            patient with severe systemic disease. After  reviewing the risks and benefits, the patient was                            deemed in satisfactory condition to undergo the                            procedure.                           After obtaining informed consent, the endoscope was                            passed under direct vision. Throughout the                            procedure, the patient's blood pressure,  pulse, and                            oxygen saturations were monitored continuously. The                            GIF-H190 ([id number]) Olympus Adult EGD was introduced                            through the mouth, and advanced to the second part                            of duodenum. The upper GI endoscopy was                            accomplished without difficulty. The patient                            tolerated the procedure well. Scope In: Scope Out: Findings:      Non-severe esophagitis with no bleeding was found in the lower third of       the esophagus.      Localized moderate inflammation characterized by erosions and erythema       was found in the gastric antrum. Biopsies were taken with a cold forceps       for histology. The pathology specimen was placed into Bottle Number 2.      One partially obstructing non-bleeding cratered duodenal ulcer with no       stigmata of bleeding was found in the first portion of the duodenum. The       lesion was 15 mm in largest dimension. Biopsies were taken with a cold       forceps for histology. the ulcer was located at the junction of the bulb       and 2nd portion and had marked flamed edematous tissue around it. The       edges were biopsy was quite hard. The pathology specimen was placed into       Bottle Number 1. Impression:               - Non-severe esophagitis.                           -  Gastritis. Biopsied.                           - One partially obstructing non-bleeding duodenal                            ulcer with no stigmata of bleeding. Biopsied. Recommendation:           - Clear liquid diet.                           - Use Protonix (pantoprazole) 80 mg IV daily.                           - would hold oral anticoagulants and consider                            Lovenox bridging for now Procedure Code(s):        --- Professional ---                           [id number], Esophagogastroduodenoscopy, flexible,                             transoral; with biopsy, single or multiple Diagnosis Code(s):        --- Professional ---                           K26.9, Duodenal ulcer, unspecified as acute or                            chronic, without hemorrhage or perforation                           K92.0, Hematemesis                           K29.70, Gastritis, unspecified, without bleeding                           K20.9, Esophagitis, unspecified CPT copyright 2017 American Medical Association. All rights reserved. The codes documented in this report are preliminary and upon coder review may  be revised to meet current compliance requirements. Nancy Fetter Dr., MD 11/27/2017 11:40:30 AM This report has been signed electronically. Number of Addenda: 0

## 2017-11-27 NOTE — Transfer of Care (Signed)
Immediate Anesthesia Transfer of Care Note  Patient: Dakota [name]  Procedure(s) Performed: ESOPHAGOGASTRODUODENOSCOPY (EGD) (N/A ) BIOPSY  Patient Location: Endoscopy Unit  Anesthesia Type:MAC  Level of Consciousness: drowsy  Airway & Oxygen Therapy: Patient Spontanous Breathing and Patient connected to nasal cannula oxygen  Post-op Assessment: Report given to RN and Post -op Vital signs reviewed and stable  Post vital signs: Reviewed and stable  Last Vitals:  Vitals Value Taken Time  BP    Temp    Pulse    Resp    SpO2      Last Pain:  Vitals:   11/27/17 1012  TempSrc: Oral  PainSc: 0-No pain         Complications: No apparent anesthesia complications

## 2017-11-27 NOTE — Anesthesia Preprocedure Evaluation (Addendum)
Anesthesia Evaluation  Patient identified by MRN, date of birth, ID band Patient awake    Reviewed: Allergy & Precautions, NPO status , Patient's Chart, lab work & pertinent test results  Airway Mallampati: II   Neck ROM: Full    Dental  (+) Edentulous Upper, Edentulous Lower   Pulmonary former smoker,     + decreased breath sounds      Cardiovascular hypertension, + CAD, + Past MI and + Peripheral Vascular Disease  + dysrhythmias Atrial Fibrillation + pacemaker  Rhythm:Regular Rate:Normal     Neuro/Psych PSYCHIATRIC DISORDERS Anxiety CVA    GI/Hepatic Neg liver ROS, PUD, GERD  ,  Endo/Other  negative endocrine ROS  Renal/GU      Musculoskeletal negative musculoskeletal ROS (+)   Abdominal Normal abdominal exam  (+)   Peds  Hematology negative hematology ROS (+)   Anesthesia Other Findings - HLD  Reproductive/Obstetrics                           Lab Results  Component Value Date   WBC 14.9 (H) 11/27/2017   HGB 8.8 (L) 11/27/2017   HCT 25.8 (L) 11/27/2017   MCV 88.1 11/27/2017   PLT 180 11/27/2017   Echo: - Left ventricle: The cavity size was normal. There was moderate   concentric hypertrophy. Systolic function was vigorous. The   estimated ejection fraction was in the range of 65% to 70%. Wall   motion was normal; there were no regional wall motion   abnormalities. - Aortic valve: Trileaflet; mildly thickened, mildly calcified   leaflets. There was mild regurgitation. - Aortic root: The aortic root was normal in size. - Mitral valve: Calcified annulus. Mildly thickened leaflets .   There was mild regurgitation. - Left atrium: The atrium was moderately dilated. - Right ventricle: Systolic function was normal. - Right atrium: The atrium was moderately dilated. - Tricuspid valve: There was mild regurgitation. - Pulmonary arteries: Systolic pressure was within the normal   range. -  Inferior vena cava: The vessel was normal in size. - Pericardium, extracardiac: There was no pericardial effusion.  EKG: V paced  Anesthesia Physical Anesthesia Plan  ASA: III  Anesthesia Plan: MAC   Post-op Pain Management:    Induction: Intravenous  PONV Risk Score and Plan: 1 and Propofol infusion  Airway Management Planned: Nasal Cannula  Additional Equipment: None  Intra-op Plan:   Post-operative Plan:   Informed Consent: I have reviewed the patients History and Physical, chart, labs and discussed the procedure including the risks, benefits and alternatives for the proposed anesthesia with the patient or authorized representative who has indicated his/her understanding and acceptance.     Plan Discussed with: CRNA  Anesthesia Plan Comments:        Anesthesia Quick Evaluation

## 2017-11-27 NOTE — Progress Notes (Signed)
PROGRESS NOTE  Dakota [name]  BOF:[id number] DOB: [birth year] DOA: 11/24/2017 PCP: Tonia Ghent, MD   Brief Narrative: Dakota [name] is an 84 y.o. male with a history of PAF, CHB s/p PPM, GERD w/schatzki's ring, HTN, and recent admission for right MCA CVA s/p thrombectomy, discharged to CIR on eliquis who developed melena/acute GI bleeding with hypotension, readmitted to [facility] 7/26. He was given IV fluids with improvement in BP, requiring 1u PRBCs for hemoglobin continuing to drop in setting of melena despite holding eliquis. GI was consulted and is planning EGD 7/29. Had large volume red blood per rectum this AM.  Assessment & Plan: Principal Problem:   Hypovolemic shock ([place]) Active Problems:   HLD (hyperlipidemia)   Essential hypertension, benign   CAD, NATIVE VESSEL   Atrial fibrillation ([name])   CHB (complete heart block) - s/p MDT 1 lead PPM   Cerebrovascular accident (CVA) due to thrombosis of right middle cerebral artery ([place])   GIB (gastrointestinal bleeding)  Acute blood loss anemia: s/p 1u PRBCs on 7/26, 7/27. Hgb essentially stable, but will continue monitoring.  - CBC in AM. Transfusion threshold is 8 or with ongoing bleeding   Hypotension, Hx HTN: No evidence of end-organ damage, BP improved once volume status repleted. - Continue IV metoprolol (replaced coreg po), hold for MAP < 70.  Melena due to duodenal ulcer as well as gastritis and esophagitis noted on EGD 7/29:  - Continue protonix gtt x72 hours > then 80mg  IV daily - Follow up gastritis biopsy - Clear liquids per GI, though this is limited by dysphagia. Will have SLP evaluate for thickened consistency recommended.  Right CVA with residual deficits: s/p thrombectomy.  - Holding eliquis, will start lovenox as bridge to monitor for bleeding.  - Continue statin.  - PT/OT/SLP to be reordered once work up complete with plans to return to CIR.  Acute delirium:  - Continue seroquel qHS, stopped AM dose due to  somnolence. Currently unable to take po medications, so will restart those once able. - On depakote, continued. - Xanax BID prn continued. - Delirium precautions  CAD: Fortunately no chest pain - Restarted BB, will restart statin once taking po.  Atrial fibrillation, complete heart block:  - Restarting beta blocker (metoprolol substituted for coreg) - Monitor K, Mg, keep >4 and >2 respectively. - Holding eliquis, starting lovenox as above  Hyperlipidemia:  - Statin when taking po  Leukocytosis, UTI: Don't believe hypotension is due to sepsis. Started on bactrim at [facility] though has K beginning to elevate, Cr rising.  - Continue cipro. WBC improving. Transition to bactrim per susceptibility data (PCN allergy) once cleared to take po.   Smokeless tobacco use: ?nicotine withdrawal worsening behavioral abnormalities.  - Continue low dose nicotine patch, will increase dose today  Acute delirium: Hope to improve with administration of po meds including depakote, xanax, seroquel  DVT prophylaxis: SCDs Code Status: Full Family Communication: Son at bedside Disposition Plan: plan to return to CIR once work up complete, pt's hgb stable and bleeding subsided  Consultants:   GI  Procedures:   EGD 11/27/2017: Findings:      Non-severe esophagitis with no bleeding was found in the lower third of       the esophagus.      Localized moderate inflammation characterized by erosions and erythema       was found in the gastric antrum. Biopsies were taken with a cold forceps       for histology. The  pathology specimen was placed into Bottle Number 2.      One partially obstructing non-bleeding cratered duodenal ulcer with no       stigmata of bleeding was found in the first portion of the duodenum. The       lesion was 15 mm in largest dimension. Biopsies were taken with a cold       forceps for histology. the ulcer was located at the junction of the bulb       and 2nd portion and had marked  flamed edematous tissue around it. The       edges were biopsy was quite hard. The pathology specimen was placed into       Bottle Number 1. Impression:       - Non-severe esophagitis.                           - Gastritis. Biopsied.                           - One partially obstructing non-bleeding duodenal                            ulcer with no stigmata of bleeding. Biopsied. Recommendation: Clear liquid diet.                           - Use Protonix (pantoprazole) 80 mg IV daily.                           - would hold oral anticoagulants and consider                            Lovenox bridging for now  Antimicrobials:  None   Subjective: No further bleeding reported. Is having ongoing delirium requiring frequent redirection.   Objective: Vitals:   11/27/17 1012 11/27/17 1125 11/27/17 1135 11/27/17 1145  BP: (!) 159/80 (!) 80/36 (!) 109/49 107/78  Pulse: 82 83 89 82  Resp: 17 18 20  (!) 23  Temp: 98.8 F (37.1 C) 98.7 F (37.1 C)    TempSrc: Oral Oral    SpO2: 99% 97% 99% 97%  Weight: 82.7 kg (182 lb 5.1 oz)     Height: 5\' 7"  (1.702 m)       Intake/Output Summary (Last 24 hours) at 11/27/2017 1344 Last data filed at 11/27/2017 1308 Gross per 24 hour  Intake 2110.86 ml  Output 2850 ml  Net -739.14 ml   Filed Weights   11/26/17 0609 11/27/17 0645 11/27/17 1012  Weight: 81.5 kg (179 lb 10.8 oz) 82.7 kg (182 lb 5.1 oz) 82.7 kg (182 lb 5.1 oz)   Gen: Elderly, confused male in no distress Pulm: Nonlabored breathing room air. Clear. CV: Irreg irreg. No murmur, rub, or gallop. No JVD, no significant dependent edema. GI: Abdomen soft, non-tender, non-distended, with normoactive bowel sounds.  Ext: Warm, no deformities Skin: No rashes, lesions or ulcers on visualized skin.  Neuro: Alert, confused, severe dysphagia, left hemineglect with rightward gaze preference. Does move 4 extremities. Psych: Judgement and insight appear impaired.  Data Reviewed: I have personally  reviewed following labs and imaging studies  CBC: Recent Labs  Lab 11/22/17 0715 11/24/17 0436  11/25/17 1318 11/25/17 2058 11/26/17 0705 11/26/17  1203 11/27/17 0602  WBC 16.7* 15.2*   < > 24.5* 27.3* 19.9* 20.2* 14.9*  NEUTROABS 11.3* 9.8*  --   --   --   --   --   --   HGB 14.9 13.9   < > 8.0* 9.4* 9.2* 8.9* 8.8*  HCT 43.5 42.5   < > 23.8* 28.4* 27.8* 26.8* 25.8*  MCV 88.1 90.8   < > 90.2 88.2 88.3 88.4 88.1  PLT 198 238   < > 176 193 180 182 180   < > = values in this interval not displayed.   Basic Metabolic Panel: Recent Labs  Lab 11/21/17 0347 11/22/17 0715 11/24/17 1532 11/26/17 0705 11/27/17 0602  NA 136 137 138 138 136  K 3.7 3.9 4.8 4.1 3.5  CL 104 106 106 109 104  CO2 23 24 22 24 24   GLUCOSE 123* 122* 124* 120* 104*  BUN 33* 27* 54* 24* 12  CREATININE 0.90 0.85 1.00 0.94 0.87  CALCIUM 9.1 8.8* 8.5* 8.3* 8.2*   GFR: Estimated Creatinine Clearance: 62.7 mL/min (by C-G formula based on SCr of 0.87 mg/dL). Liver Function Tests: Recent Labs  Lab 11/22/17 0715  AST 24  ALT 31  ALKPHOS 49  BILITOT 1.4*  PROT 6.2*  ALBUMIN 2.6*   CBG: Recent Labs  Lab 11/25/17 0750 11/26/17 0758 11/27/17 0801  GLUCAP 120* 107* 94   Urine analysis:    Component Value Date/Time   COLORURINE YELLOW 11/21/2017 1810   APPEARANCEUR CLEAR 11/21/2017 1810   LABSPEC 1.017 11/21/2017 1810   PHURINE 5.0 11/21/2017 1810   GLUCOSEU NEGATIVE 11/21/2017 1810   HGBUR LARGE (A) 11/21/2017 1810   BILIRUBINUR NEGATIVE 11/21/2017 1810   KETONESUR NEGATIVE 11/21/2017 1810   PROTEINUR 30 (A) 11/21/2017 1810   UROBILINOGEN 1.0 12/29/2014 0816   NITRITE NEGATIVE 11/21/2017 1810   LEUKOCYTESUR NEGATIVE 11/21/2017 1810   Recent Results (from the past 240 hour(s))  Culture, Urine     Status: Abnormal   Collection Time: 11/22/17  3:10 AM  Result Value Ref Range Status   Specimen Description URINE, CLEAN CATCH  Final   Special Requests   Final    NONE Performed at [facility], [place] [address], [place], [place] [address]    Culture >=100,000 COLONIES/mL ENTEROBACTER CLOACAE (A)  Final   Report Status 11/24/2017 FINAL  Final   Organism ID, Bacteria ENTEROBACTER CLOACAE (A)  Final      Susceptibility   Enterobacter cloacae - MIC*    CEFAZOLIN >=64 RESISTANT Resistant     CEFTRIAXONE <=1 SENSITIVE Sensitive     CIPROFLOXACIN <=0.25 SENSITIVE Sensitive     GENTAMICIN <=1 SENSITIVE Sensitive     IMIPENEM <=0.25 SENSITIVE Sensitive     NITROFURANTOIN 64 INTERMEDIATE Intermediate     TRIMETH/SULFA <=20 SENSITIVE Sensitive     PIP/TAZO <=4 SENSITIVE Sensitive     * >=100,000 COLONIES/mL ENTEROBACTER CLOACAE      Radiology Studies: No results found.  Scheduled Meds: . chlorhexidine  15 mL Mouth Rinse BID  . divalproex  250 mg Oral Q8H  . famotidine  20 mg Oral BID  . mouth rinse  15 mL Mouth Rinse q12n4p  . metoprolol tartrate  2.5 mg Intravenous Q8H  . nicotine  7 mg Transdermal Daily  . [START ON 11/28/2017] pantoprazole  40 mg Intravenous Q12H  . pravastatin  20 mg Oral q1800  . QUEtiapine  25 mg Oral QHS  . sodium chloride flush  3 mL  Intravenous Q12H   Continuous Infusions: . sodium chloride 110 mL/hr at 11/26/17 2251  . ciprofloxacin 400 mg (11/26/17 1615)  . pantoprozole (PROTONIX) infusion 8 mg/hr (11/27/17 1243)     LOS: 3 days   Time spent: 25 minutes.  Patrecia Pour, MD [facility] [email or web address] Password [email or web address] 11/27/2017, 1:44 PM

## 2017-11-27 NOTE — Progress Notes (Signed)
SLP Cancellation Note  Patient Details Name: Dakota [name] MRN: [id number] DOB: 1928/12/28   Cancelled treatment:       Reason Eval/Treat Not Completed: Medical issues which prohibited therapy - Pt currently NPO for EGD scheduled for today. Will continue efforts.  Kayode Petion B. [facility], CCC-SLP Speech Language Pathologist [phone]  Shonna Chock 11/27/2017, 9:20 AM

## 2017-11-27 NOTE — Anesthesia Postprocedure Evaluation (Signed)
Anesthesia Post Note  Patient: Dakota [name]  Procedure(s) Performed: ESOPHAGOGASTRODUODENOSCOPY (EGD) (N/A ) BIOPSY     Patient location during evaluation: PACU Anesthesia Type: MAC Level of consciousness: awake and alert Pain management: pain level controlled Vital Signs Assessment: post-procedure vital signs reviewed and stable Respiratory status: spontaneous breathing, nonlabored ventilation, respiratory function stable and patient connected to nasal cannula oxygen Cardiovascular status: stable and blood pressure returned to baseline Postop Assessment: no apparent nausea or vomiting Anesthetic complications: no    Last Vitals:  Vitals:   11/27/17 1135 11/27/17 1145  BP: (!) 109/49 107/78  Pulse: 89 82  Resp: 20 (!) 23  Temp:    SpO2: 99% 97%    Last Pain:  Vitals:   11/27/17 1125  TempSrc: Oral  PainSc:                  Effie Berkshire

## 2017-11-27 NOTE — Care Management Important Message (Signed)
Important Message  Patient Details  Name: Dakota [name] MRN: [id number] Date of Birth: 1928-10-01   Medicare Important Message Given:  No  Due to illness patient did not sign.  Ellarie Picking 11/27/2017, 3:15 PM

## 2017-11-27 NOTE — Progress Notes (Signed)
ANTICOAGULATION CONSULT NOTE - Initial Consult  Pharmacy Consult for Heparin  Indication: atrial fibrillation  Allergies  Allergen Reactions  . Warfarin And Related Other (See Comments)    Lost a lot of weight and also experienced numbness  . Atorvastatin Other (See Comments)    REACTION: MUSCLE PAIN  . Ezetimibe Other (See Comments)    REACTION: MUSCLE PAIN  . Diazepam Other (See Comments)    UNKNOWN, per pt  . Penicillins Other (See Comments)    UNKNOWN, per pt    Patient Measurements: Height: 5\' 7"  (170.2 cm) Weight: 182 lb 5.1 oz (82.7 kg) IBW/kg (Calculated) : 66.1  Heparin dosing weight: 82.6 kg   Vital Signs: Temp: 98.7 F (37.1 C) (07/29 1125) Temp Source: Oral (07/29 1125) BP: 107/78 (07/29 1145) Pulse Rate: 82 (07/29 1145)  Labs: Recent Labs    11/24/17 1532  11/26/17 0705 11/26/17 1203 11/27/17 0602  HGB 11.6*   < > 9.2* 8.9* 8.8*  HCT 35.0*   < > 27.8* 26.8* 25.8*  PLT 245   < > 180 182 180  CREATININE 1.00  --  0.94  --  0.87   < > = values in this interval not displayed.    Estimated Creatinine Clearance: 62.7 mL/min (by C-G formula based on SCr of 0.87 mg/dL).   Medical History: Past Medical History:  Diagnosis Date  . Anxiety   . Back pain, chronic   . Complete heart block ([place]) 07/18/2014   Medtronic [place] model [id number] (serial number [id number])  singe lead PPM  . Esophageal obstruction due to food impaction 2017  . GERD (gastroesophageal reflux disease) 10/2002  . Hyperlipidemia 12/1994  . Hypertension   . Hypokalemia   . Lung collapse 03/1991   Fall  . Permanent atrial fibrillation ([name])    Refused coumadin therapy    Medications:  Medications Prior to Admission  Medication Sig Dispense Refill Last Dose  . ALPRAZolam (XANAX) 0.5 MG tablet TAKE ONE-HALF TABLET BY MOUTH TWICE DAILY AS NEEDED FOR ANXIETY (Patient taking differently: Take 0.25 mg by mouth 2 (two) times daily as needed for anxiety. TAKE ONE-HALF TABLET BY MOUTH TWICE  DAILY AS NEEDED FOR ANXIETY) 60 tablet 0 11/15/2017 at prn  . apixaban (ELIQUIS) 5 MG TABS tablet Take 1 tablet (5 mg total) by mouth 2 (two) times daily. 60 tablet    . calcium carbonate (TUMS - DOSED IN MG ELEMENTAL CALCIUM) 500 MG chewable tablet Chew 1 tablet by mouth daily as needed for indigestion.    11/15/2017 at Unknown time  . carvedilol (COREG) 25 MG tablet TAKE 1 TABLET BY MOUTH TWICE DAILY WITH  A  MEAL. 180 tablet 1 11/15/2017 at 0730  . divalproex (DEPAKOTE SPRINKLE) 125 MG capsule Take 2 capsules (250 mg total) by mouth every 8 (eight) hours.     . docusate sodium (COLACE) 100 MG capsule Take 1 capsule (100 mg total) by mouth daily as needed for mild constipation.   11/15/2017 at Unknown time  . hydrochlorothiazide (MICROZIDE) 12.5 MG capsule TAKE ONE CAPSULE BY MOUTH ONCE DAILY 90 capsule 1 11/15/2017 at Unknown time  . Magnesium Hydroxide (MILK OF MAGNESIA PO) Take 1 tablet by mouth daily as needed.   unkonwn at prn  . Maltodextrin-Xanthan Gum (RESOURCE THICKENUP CLEAR) POWD Take 120 g by mouth as needed (nectar thick liquid consistency).     . nicotine (NICODERM CQ - DOSED IN MG/24 HR) 7 mg/24hr patch Place 1 patch (7 mg total) onto the skin daily.  28 patch 0   . nitroGLYCERIN (NITROSTAT) 0.4 MG SL tablet Place 1 tablet (0.4 mg total) under the tongue every 5 (five) minutes as needed for chest pain. 25 tablet 3 unknown at prn  . pantoprazole (PROTONIX) 40 MG tablet Take 1 tablet (40 mg total) by mouth daily. 30 tablet 0 11/15/2017 at Unknown time  . pravastatin (PRAVACHOL) 20 MG tablet Take 1 tablet (20 mg total) by mouth daily at 6 PM.     . QUEtiapine (SEROQUEL) 25 MG tablet Take 0.5 tablets (12.5 mg total) by mouth daily.     . QUEtiapine (SEROQUEL) 25 MG tablet Take 1 tablet (25 mg total) by mouth at bedtime.     . ranitidine (ZANTAC) 150 MG tablet TAKE 1 TABLET BY MOUTH ONCE DAILY 90 tablet 1 11/15/2017 at Unknown time   Scheduled:  . chlorhexidine  15 mL Mouth Rinse BID  .  divalproex  250 mg Oral Q8H  . famotidine  20 mg Oral BID  . mouth rinse  15 mL Mouth Rinse q12n4p  . metoprolol tartrate  2.5 mg Intravenous Q8H  . [START ON 11/28/2017] nicotine  14 mg Transdermal Daily  . [START ON 11/28/2017] pantoprazole  40 mg Intravenous Q12H  . pravastatin  20 mg Oral q1800  . QUEtiapine  25 mg Oral QHS  . sodium chloride flush  3 mL Intravenous Q12H    Assessment: 84 y.o male on Eliquis PTA for h/o Afib.  Eliquis is on hold due to GIB. S/p EGD today 11/27/17:  Melena due to duodenal ulcer as well as gastritis and esophagitis. Recent right CVA 11/16/17, s/p thrombectomy.  Starting heparin IV infusion as bridge while eliquis on hold. Last eliquis dose taken on 7/26 @ 0800, while on inpatient rehab (CIR) unit.  Hgb decreased to  8.8, pltc 180k,  Dr. Bonner Puna ordered to keep heparin level at low end of therapeutic range, goal HL 0.3-0.5 units/ml.    Plan:  Start IV Heparin drip 1000 units/hr Heparin level/aPTT 8 hours after heparin  Daily Heparin level, aPTT, and CBC.  Can cancel the aPTTs if heparin level does not appear effected by prior recent eliquis  Nicole Cella, [place] Pharmacist Pager: [phone] Please check AMION for all [place] phone numbers After 10:00 PM, call [place] [phone] 11/27/2017,2:24 PM

## 2017-11-27 NOTE — Interval H&P Note (Signed)
History and Physical Interval Note:  11/27/2017 10:44 AM  Dakota [name]  has presented today for surgery, with the diagnosis of GI bleed  The various methods of treatment have been discussed with the patient and family. After consideration of risks, benefits and other options for treatment, the patient has consented to  Procedure(s): ESOPHAGOGASTRODUODENOSCOPY (EGD) (N/A) as a surgical intervention .  The patient's history has been reviewed, patient examined, no change in status, stable for surgery.  I have reviewed the patient's chart and labs.  Questions were answered to the patient's satisfaction.     Nancy Fetter

## 2017-11-27 NOTE — Anesthesia Procedure Notes (Signed)
Procedure Name: MAC Date/Time: 11/27/2017 10:49 AM Performed by: Leonor Liv, CRNA Pre-anesthesia Checklist: Patient identified, Emergency Drugs available, Suction available, Patient being monitored and Timeout performed Patient Re-evaluated:Patient Re-evaluated prior to induction Oxygen Delivery Method: Nasal cannula Airway Equipment and Method: Bite block Placement Confirmation: positive ETCO2

## 2017-11-27 NOTE — Progress Notes (Signed)
Inpatient Rehabilitation Admissions Coordinator  I await therapy reevaluations since readmit to acute. Now post EGD today. I will assist in determining if patient appropriate to be readmitted to CIR. I will follow up tomorrow.  Danne Baxter, RN, MSN Rehab Admissions Coordinator [phone] 11/27/2017 4:48 PM

## 2017-11-28 ENCOUNTER — Inpatient Hospital Stay (HOSPITAL_COMMUNITY): Payer: Medicare Other

## 2017-11-28 ENCOUNTER — Encounter (HOSPITAL_COMMUNITY): Payer: Self-pay | Admitting: Gastroenterology

## 2017-11-28 DIAGNOSIS — I4891 Unspecified atrial fibrillation: Secondary | ICD-10-CM

## 2017-11-28 DIAGNOSIS — R571 Hypovolemic shock: Secondary | ICD-10-CM

## 2017-11-28 DIAGNOSIS — K922 Gastrointestinal hemorrhage, unspecified: Secondary | ICD-10-CM

## 2017-11-28 DIAGNOSIS — D62 Acute posthemorrhagic anemia: Secondary | ICD-10-CM

## 2017-11-28 DIAGNOSIS — I63311 Cerebral infarction due to thrombosis of right middle cerebral artery: Secondary | ICD-10-CM

## 2017-11-28 LAB — BASIC METABOLIC PANEL
Anion gap: 7 (ref 5–15)
BUN: 15 mg/dL (ref 8–23)
CALCIUM: 7.8 mg/dL — AB (ref 8.9–10.3)
CHLORIDE: 105 mmol/L (ref 98–111)
CO2: 24 mmol/L (ref 22–32)
CREATININE: 0.93 mg/dL (ref 0.61–1.24)
GFR calc Af Amer: >60 mL/min (ref >60)
GFR calc non Af Amer: >60 mL/min (ref >60)
GLUCOSE: 151 mg/dL — AB (ref 70–99)
Potassium: 3.6 mmol/L (ref 3.5–5.1)
Sodium: 136 mmol/L (ref 135–145)

## 2017-11-28 LAB — HEPARIN LEVEL (UNFRACTIONATED)
HEPARIN UNFRACTIONATED: 0.12 [IU]/mL — AB (ref 0.30–0.70)
Heparin Unfractionated: 0.29 IU/mL — ABNORMAL LOW (ref 0.30–0.70)

## 2017-11-28 LAB — CBC
HEMATOCRIT: 23.4 % — AB (ref 39.0–52.0)
HEMATOCRIT: 28.5 % — AB (ref 39.0–52.0)
HEMOGLOBIN: 7.8 g/dL — AB (ref 13.0–17.0)
HEMOGLOBIN: 9.8 g/dL — AB (ref 13.0–17.0)
MCH: 29.7 pg (ref 26.0–34.0)
MCH: 29.4 pg (ref 26.0–34.0)
MCHC: 33.3 g/dL (ref 30.0–36.0)
MCHC: 34.4 g/dL (ref 30.0–36.0)
MCV: 89 fL (ref 78.0–100.0)
MCV: 85.6 fL (ref 78.0–100.0)
Platelets: 238 10*3/uL (ref 150–400)
Platelets: 215 10*3/uL (ref 150–400)
RBC: 2.63 MIL/uL — ABNORMAL LOW (ref 4.22–5.81)
RBC: 3.33 MIL/uL — AB (ref 4.22–5.81)
RDW: 14.1 % (ref 11.5–15.5)
RDW: 14.4 % (ref 11.5–15.5)
WBC: 14.4 10*3/uL — ABNORMAL HIGH (ref 4.0–10.5)
WBC: 16.1 10*3/uL — ABNORMAL HIGH (ref 4.0–10.5)

## 2017-11-28 LAB — GLUCOSE, CAPILLARY
GLUCOSE-CAPILLARY: 104 mg/dL — AB (ref 70–99)
Glucose-Capillary: 120 mg/dL — ABNORMAL HIGH (ref 70–99)
Glucose-Capillary: 101 mg/dL — ABNORMAL HIGH (ref 70–99)
Glucose-Capillary: 94 mg/dL (ref 70–99)

## 2017-11-28 LAB — APTT
APTT: 35 s (ref 24–36)
aPTT: 70 seconds — ABNORMAL HIGH (ref 24–36)

## 2017-11-28 LAB — PREPARE RBC (CROSSMATCH)

## 2017-11-28 MED ORDER — SODIUM CHLORIDE 0.9 % IV SOLN
8.0000 mg/h | INTRAVENOUS | Status: DC
  Administered 2017-11-28 – 2017-12-01 (×7): 8 mg/h via INTRAVENOUS
  Filled 2017-11-28 (×6): qty 80
  Filled 2017-11-28: qty 40
  Filled 2017-11-28 (×4): qty 80

## 2017-11-28 MED ORDER — CARVEDILOL 25 MG PO TABS: 25.0000 mg | ORAL_TABLET | Freq: Two times a day (BID) | ORAL | Status: DC

## 2017-11-28 MED ORDER — SODIUM CHLORIDE 0.9 % IV SOLN
INTRAVENOUS | Status: DC
  Administered 2017-11-28: 125 mL/h via INTRAVENOUS
  Administered 2017-11-28 – 2017-11-29 (×4): via INTRAVENOUS
  Administered 2017-11-30: 125 mL/h via INTRAVENOUS

## 2017-11-28 MED ORDER — HYDROCHLOROTHIAZIDE 12.5 MG PO CAPS: 12.5000 mg | ORAL_CAPSULE | Freq: Every day | ORAL | Status: DC

## 2017-11-28 MED ORDER — POTASSIUM CHLORIDE IN NACL 20-0.9 MEQ/L-% IV SOLN: INTRAVENOUS | Status: DC

## 2017-11-28 MED ORDER — SULFAMETHOXAZOLE-TRIMETHOPRIM 800-160 MG PO TABS: 1.0000 | ORAL_TABLET | Freq: Two times a day (BID) | ORAL | Status: DC

## 2017-11-28 MED ORDER — DIVALPROEX SODIUM 125 MG PO CSDR: 250.0000 mg | DELAYED_RELEASE_CAPSULE | Freq: Three times a day (TID) | ORAL | Status: DC

## 2017-11-28 MED ORDER — PRO-STAT SUGAR FREE PO LIQD: 30.0000 mL | Freq: Two times a day (BID) | ORAL | Status: DC

## 2017-11-28 MED ORDER — QUETIAPINE FUMARATE 25 MG PO TABS: 25.0000 mg | ORAL_TABLET | Freq: Every day | ORAL | Status: DC

## 2017-11-28 MED ORDER — PANTOPRAZOLE SODIUM 40 MG IV SOLR
80.0000 mg | Freq: Once | INTRAVENOUS | Status: AC
  Administered 2017-11-28: 80 mg via INTRAVENOUS
  Filled 2017-11-28: qty 80

## 2017-11-28 MED ORDER — PANTOPRAZOLE SODIUM 40 MG IV SOLR
40.0000 mg | Freq: Two times a day (BID) | INTRAVENOUS | Status: AC
  Administered 2017-12-01: 40 mg via INTRAVENOUS
  Filled 2017-11-28: qty 40

## 2017-11-28 MED ORDER — NICOTINE 7 MG/24HR TD PT24: 7.0000 mg | MEDICATED_PATCH | Freq: Every day | TRANSDERMAL | Status: DC

## 2017-11-28 MED ORDER — PRAVASTATIN SODIUM 20 MG PO TABS: 20.0000 mg | ORAL_TABLET | Freq: Every day | ORAL | Status: DC

## 2017-11-28 MED ORDER — SODIUM CHLORIDE 0.9% IV SOLUTION
Freq: Once | INTRAVENOUS | Status: AC
  Administered 2017-11-28: 10:00:00 via INTRAVENOUS

## 2017-11-28 MED ORDER — ENSURE ENLIVE PO LIQD: 237.0000 mL | Freq: Two times a day (BID) | ORAL | Status: DC

## 2017-11-28 MED ORDER — SODIUM CHLORIDE 0.9 % IV BOLUS
500.0000 mL | Freq: Once | INTRAVENOUS | Status: AC
  Administered 2017-11-28: 500 mL via INTRAVENOUS

## 2017-11-28 NOTE — Consult Note (Addendum)
PULMONARY / CRITICAL CARE MEDICINE   Name: Dakota [name] MRN: [id number] DOB: 08-20-1928    ADMISSION DATE:  11/24/2017 CONSULTATION DATE: 11/28/2017  REFERRING MD: [facility]  CHIEF COMPLAINT: Hypotension in setting of GI bleed  HISTORY OF PRESENT ILLNESS:   84 year old male who was admitted to [facility] 11/17/2017 with a right MCA stroke.  He underwent interventional radiology vascularization.  Subsequently he was discharged to rehab center at which time he developed a GI bleed.  Had been on Eliquis for his atrial fib.  This was discontinued.  He was readmitted to [facility] subsequently rechallenge with low-dose heparin again had a GI bleed on 11/28/2017.  Pulmonary critical care called to the bedside to evaluate and transfer him to the intensive care unit. His extensive past medical history as well documented below.  PAST MEDICAL HISTORY :  He  has a past medical history of Anxiety, Back pain, chronic, Complete heart block ([place]) (07/18/2014), Esophageal obstruction due to food impaction (2017), GERD (gastroesophageal reflux disease) (10/2002), Hyperlipidemia (12/1994), Hypertension, Hypokalemia, Lung collapse (03/1991), and Permanent atrial fibrillation ([place]).  PAST SURGICAL HISTORY: He  has a past surgical history that includes Back surgery (11/91); Cardiac catheterization (06/2004); Eye surgery; temporary pacemaker insertion (N/A, 07/18/2014); left heart catheterization with coronary angiogram (N/A, 07/18/2014); permanent pacemaker insertion (N/A, 07/18/2014); Esophagogastroduodenoscopy (N/A, 01/06/2016); Radiology with anesthesia (N/A, 11/16/2017); IR PERCUTANEOUS ART THROMBECTOMY/INFUSION INTRACRANIAL INC DIAG ANGIO (11/16/2017); and IR [facility] (11/16/2017).  Allergies  Allergen Reactions  . Warfarin And Related Other (See Comments)    Lost a lot of weight and also experienced numbness  . Atorvastatin Other (See Comments)    REACTION: MUSCLE PAIN  . Ezetimibe Other (See Comments)     REACTION: MUSCLE PAIN  . Diazepam Other (See Comments)    UNKNOWN, per pt  . Penicillins Other (See Comments)    UNKNOWN, per pt    No current facility-administered medications on file prior to encounter.    Current Outpatient Medications on File Prior to Encounter  Medication Sig  . ALPRAZolam (XANAX) 0.5 MG tablet TAKE ONE-HALF TABLET BY MOUTH TWICE DAILY AS NEEDED FOR ANXIETY (Patient taking differently: Take 0.25 mg by mouth 2 (two) times daily as needed for anxiety. TAKE ONE-HALF TABLET BY MOUTH TWICE DAILY AS NEEDED FOR ANXIETY)  . apixaban (ELIQUIS) 5 MG TABS tablet Take 1 tablet (5 mg total) by mouth 2 (two) times daily.  . calcium carbonate (TUMS - DOSED IN MG ELEMENTAL CALCIUM) 500 MG chewable tablet Chew 1 tablet by mouth daily as needed for indigestion.   . carvedilol (COREG) 25 MG tablet TAKE 1 TABLET BY MOUTH TWICE DAILY WITH  A  MEAL.  [name] divalproex (DEPAKOTE SPRINKLE) 125 MG capsule Take 2 capsules (250 mg total) by mouth every 8 (eight) hours.  . docusate sodium (COLACE) 100 MG capsule Take 1 capsule (100 mg total) by mouth daily as needed for mild constipation.  . hydrochlorothiazide (MICROZIDE) 12.5 MG capsule TAKE ONE CAPSULE BY MOUTH ONCE DAILY  . Magnesium Hydroxide (MILK OF MAGNESIA PO) Take 1 tablet by mouth daily as needed.  . Maltodextrin-Xanthan Gum (RESOURCE THICKENUP CLEAR) POWD Take 120 g by mouth as needed (nectar thick liquid consistency).  . nicotine (NICODERM CQ - DOSED IN MG/24 HR) 7 mg/24hr patch Place 1 patch (7 mg total) onto the skin daily.  . nitroGLYCERIN (NITROSTAT) 0.4 MG SL tablet Place 1 tablet (0.4 mg total) under the tongue every 5 (five) minutes as needed for chest pain.  [name]  pantoprazole (PROTONIX) 40 MG tablet Take 1 tablet (40 mg total) by mouth daily.  . pravastatin (PRAVACHOL) 20 MG tablet Take 1 tablet (20 mg total) by mouth daily at 6 PM.  . QUEtiapine (SEROQUEL) 25 MG tablet Take 0.5 tablets (12.5 mg total) by mouth daily.  . QUEtiapine  (SEROQUEL) 25 MG tablet Take 1 tablet (25 mg total) by mouth at bedtime.  . ranitidine (ZANTAC) 150 MG tablet TAKE 1 TABLET BY MOUTH ONCE DAILY    FAMILY HISTORY:  His family history includes Cancer in his brother, brother, and father; Heart disease in his sister and son; Hip fracture in his mother; Pneumonia in his mother; Prostate cancer in his brother. There is no history of Stroke or Colon cancer.  SOCIAL HISTORY: He  reports that he quit smoking about 64 years ago. He has a 15.00 pack-year smoking history. His smokeless tobacco use includes chew. He reports that he does not drink alcohol or use drugs.  REVIEW OF SYSTEMS:   10 point review of system taken, please see HPI for positives and negatives.   SUBJECTIVE:  84 year old male with recurrent GI bleed currently hemodynamically stable  VITAL SIGNS: BP (!) 125/55   Pulse (!) 107   Temp 98 F (36.7 C) (Oral)   Resp (!) 22   Ht 5\' 7"  (1.702 m)   Wt 175 lb 11.3 oz (79.7 kg)   SpO2 99%   BMI 27.52 kg/m   HEMODYNAMICS:    VENTILATOR SETTINGS:    INTAKE / OUTPUT: I/O last 3 completed shifts: In: 3217.6 [P.O.:120; I.V.:2497.6; IV UPJSRPRXY:585] Out: 9292 [Urine:3275]  PHYSICAL EXAMINATION: General: Elderly male in no acute distress at rest Neuro: Very hard of hearing but when arousable follows simple commands and left side neglect, but moves all extremities HEENT: Left facial droop, pupils equal reactive Cardiovascular: Heart sounds are regular regular ventricular rate of 97 Lungs: Decreased breath sounds in the base Abdomen: Soft nontender Musculoskeletal: Intact Skin: Pale white  LABS:  BMET Recent Labs  Lab 11/26/17 0705 11/27/17 0602 11/28/17 0659  NA 138 136 136  K 4.1 3.5 3.6  CL 109 104 105  CO2 24 24 24   BUN 24* 12 15  CREATININE 0.94 0.87 0.93  GLUCOSE 120* 104* 151*    Electrolytes Recent Labs  Lab 11/26/17 0705 11/27/17 0602 11/28/17 0659  CALCIUM 8.3* 8.2* 7.8*    CBC Recent Labs   Lab 11/26/17 1203 11/27/17 0602 11/28/17 0659  WBC 20.2* 14.9* 14.4*  HGB 8.9* 8.8* 7.8*  HCT 26.8* 25.8* 23.4*  PLT 182 180 238    Coag's Recent Labs  Lab 11/27/17 2340  APTT 70*    Sepsis Markers No results for input(s): LATICACIDVEN, PROCALCITON, O2SATVEN in the last 168 hours.  ABG No results for input(s): PHART, PCO2ART, PO2ART in the last 168 hours.  Liver Enzymes Recent Labs  Lab 11/22/17 0715  AST 24  ALT 31  ALKPHOS 49  BILITOT 1.4*  ALBUMIN 2.6*    Cardiac Enzymes No results for input(s): TROPONINI, PROBNP in the last 168 hours.  Glucose Recent Labs  Lab 11/25/17 0750 11/26/17 0758 11/27/17 0801 11/28/17 0801  GLUCAP 120* 107* 94 120*    Imaging No results found.   STUDIES:    CULTURES: 11/24/2017 urine culture positive for Enterobacter Cloace sensitive to ciprofloxacin  ANTIBIOTICS: 11/25/2017 Cipro for UTI>>  SIGNIFICANT EVENTS: 11/28/2017 recurrent GI bleed  LINES/TUBES:   DISCUSSION: Mr. Burgen is a 84 year old who was originally admitted to [facility]  on 11/17/2017 with right MCA stroke with occlusion in the M2 M3 and underwent revascularization.  Subsequently discharged to rehab center.  He again developed GI bleed return to [facility] and was seen by GI services was found to have a duodenal ulcer that was not bleeding.  He was rechallenge with low-dose heparin at a subtherapeutic dose but again had a GI bleed on 11/28/2017 with a decrease his hemoglobin 7.8.  He did become transiently hypotensive with a blood pressure in the low 90s.  He is being transfused 1 unit of packed cells GI services is been reconsulted this coming to see the patient.  Surgery and interventional radiology have not been consulted at the time of this dictation.  Currently his blood pressure stable at 124/76 he is awake and alert.  He does have atrial fibrillation ventricular response and been treated with low-dose metoprolol.  ASSESSMENT /  PLAN:  PULMONARY A: No acute issues P:   O 2 if needed  CARDIOVASCULAR A:  Hypo-tension secondary to acute blood loss anemia Chronic atrial fibrillation History of pacemaker insertion  P:  Transfusion IV fluid He will need central line in the near future He has been given low-dose metoprolol for atrial fibrillation ventricular response. Will discontinue nicotine patch as he is been in the hospital for multiple days and his atrial fibrillation ventricular response.   RENAL Lab Results  Component Value Date   CREATININE 0.93 11/28/2017   CREATININE 0.87 11/27/2017   CREATININE 0.94 11/26/2017   Recent Labs  Lab 11/26/17 0705 11/27/17 0602 11/28/17 0659  K 4.1 3.5 3.6    A:   No acute issues P:   Monitor electrolytes and creatinine  GASTROINTESTINAL A:   Acute GI bleed P:   N.p.o. Transfuse GI following on 11/28/2017 does not want a prescription at this time continue to follow if he rebleeds then they will consider doing repeat scoping. Consider surgical and IR consult  HEMATOLOGIC Recent Labs    11/27/17 0602 11/28/17 0659  HGB 8.8* 7.8*    A:   Acute on chronic GI bleed in the setting of anticoagulation Duodenal ulcer under revealed under recent endoscopic exam by Dr. Oletta Lamas History of esophageal stricture History gas esophageal reflux disease P:  N.p.o. Discontinuation of all anticoagulation Transfuse per protocol  INFECTIOUS A:   Reportedly has a urinary tract infection P:   Seven 32,019-day 3 of ciprofloxacin  ENDOCRINE CBG (last 3)  Recent Labs    11/26/17 0758 11/27/17 0801 11/28/17 0801  GLUCAP 107* 94 120*    A:   Hyperglycemia P:   Sliding scale insulin protocol  NEUROLOGIC A:   Status post middle cerebral artery embolism right with endovascular revascularization of occluded right MCA M2 segment on 11/16/2017 Status post stroke left-sided neglect P:   RASS goal: 0 Careful with any sedation   FAMILY  - Updates:  11/28/2017 son updated at length at bedside.  Patient updated but is extremely hard of hearing and status post right MCA stroke.  - Inter-disciplinary family meet or Palliative Care meeting due by:  day 7    Steve Sanjuan Sawa ACNP Maryanna Shape PCCM Pager [phone] till 1 pm If no answer page [phone] 11/28/2017, 10:07 AM

## 2017-11-28 NOTE — Progress Notes (Signed)
RN called for assistance of getting patient transferred to ICU. Dr. Bonner Puna at bedside, he spoke with PCCM. Richardson Landry NP to bedside to admit pt to PCCM. Assisted Evan with blood administration and transferring to to 13m11. No other interventions from RRT

## 2017-11-28 NOTE — Progress Notes (Signed)
Pt had a large, bloody BM. Pt's blood pressure also dropped. MD Bonner Puna was on the floor and made aware. 500 mL bolus of NS was ordered and 1 unit of RBC. Rapid response was called as well. Bolus given and RBC started. Pt transferred to 43M.

## 2017-11-28 NOTE — Progress Notes (Signed)
Pharmacy Antibiotic Note  Dakota [name] is a 84 y.o. male admitted on 11/24/2017 with GI bleeding.  Pharmacy consulted 11/25/17 for ciprofloxacin dosing for UTI. 7/24 UCx: Enterobacter cloacae (pan-S except R-cefazolin, I-macrobid) Total abx D#5 for Enterobacter UTI, day# 3 of Cipro IV. WBC 20.2 > 14.9>14.4, afebrile , scr wnl 0.94 S/p  EGD 7/29 showing mild esophagitis, gastritis and non-bleeding duodenal ulcer.  Hgb dropped to 7.8 today, IV heparin drip stopped/discontinued. To transfuse PRBCs. Patient transferred to MICU.  NPO   Plan: Cipro 400mg  IV BID continues.  Consider LOT, transition to PO Septra if need to continue when able to take PO.   Height: 5\' 7"  (170.2 cm) Weight: 175 lb 11.3 oz (79.7 kg) IBW/kg (Calculated) : 66.1  Temp (24hrs), Avg:98.3 F (36.8 C), Min:97.8 F (36.6 C), Max:98.7 F (37.1 C)  Recent Labs  Lab 11/22/17 0715  11/24/17 1532  11/25/17 2058 11/26/17 0705 11/26/17 1203 11/27/17 0602 11/28/17 0659  WBC 16.7*   < > 22.0*   < > 27.3* 19.9* 20.2* 14.9* 14.4*  CREATININE 0.85  --  1.00  --   --  0.94  --  0.87 0.93   < > = values in this interval not displayed.    Estimated Creatinine Clearance: 57.7 mL/min (by C-G formula based on SCr of 0.93 mg/dL).    Allergies  Allergen Reactions  . Warfarin And Related Other (See Comments)    Lost a lot of weight and also experienced numbness  . Atorvastatin Other (See Comments)    REACTION: MUSCLE PAIN  . Ezetimibe Other (See Comments)    REACTION: MUSCLE PAIN  . Diazepam Other (See Comments)    UNKNOWN, per pt  . Penicillins Other (See Comments)    UNKNOWN, per pt    Antimicrobials this admission: 7/25 Macrobid >> 7/26 7/26 Bactrim x1 7/27 Ciprofloxacin >>  Dose adjustments this admission: none  Microbiology results: 7/24 UCx: Enterobacter cloacae (pan-S except R-cefazolin, I-macrobid) 7/18 MRSA PCR: neg  Thank you for allowing pharmacy to be a part of this patient's care. Nicole Cella,  RPh Clinical Pharmacist Pager: [phone] Please check AMION for all [place] numbers 11/28/2017 10:48 AM

## 2017-11-28 NOTE — Progress Notes (Signed)
PT Cancellation Note  Patient Details Name: CRESPIN FORSTROM MRN: [id number] DOB: 1929/03/04   Cancelled Treatment:    Reason Eval/Treat Not Completed: Patient not medically ready Per MD, patient to be transferred to ICU. Hold therapies currently.  Lanney Gins, PT, DPT 11/28/17 9:57 AM Pager: [phone]

## 2017-11-28 NOTE — Progress Notes (Signed)
On call NP notified of pt having 9 beats of non-sustained v-tach. Pt asymptomatic & asleep. VS stable. Strip in chart. Will continue to monitor the pt. Hoover Brunette, RN

## 2017-11-28 NOTE — Progress Notes (Signed)
ANTICOAGULATION CONSULT NOTE - Follow Up Consult  Pharmacy Consult for heparin Indication: atrial fibrillation in setting of GIB and recent CVA  Labs: Recent Labs    11/26/17 0705 11/26/17 1203 11/27/17 0602 11/27/17 2340  HGB 9.2* 8.9* 8.8*  --   HCT 27.8* 26.8* 25.8*  --   PLT 180 182 180  --   APTT  --   --   --  70*  HEPARINUNFRC  --   --   --  0.12*  CREATININE 0.94  --  0.87  --     Assessment: 84yo male subtherapeutic on heparin with initial dosing while Eliquis on hold; RN notes no overt signs of continued bleeding but does note some remnants of black stool while cleaning pt.  Goal of Therapy:  Heparin level 0.3-0.5 units/ml   Plan:  Will increase heparin gtt by 2-3 units/kg/hr to 1200 units/hr and check level in 8 hours.    Wynona Neat, PharmD, BCPS  11/28/2017,12:15 AM

## 2017-11-28 NOTE — Progress Notes (Signed)
OT Cancellation Note  Patient Details Name: Dakota [name] MRN: [id number] DOB: 1929/02/04   Cancelled Treatment:    Reason Eval/Treat Not Completed: Medical issues which prohibited therapy;Patient not medically ready. Per MD, patient  transferred to ICU. Hold therapies currently.     Emmit Alexanders [facility] 11/28/2017, 10:59 AM

## 2017-11-28 NOTE — Progress Notes (Signed)
SLP Cancellation Note  Patient Details Name: Dakota [name] MRN: [id number] DOB: [birth year]   Cancelled treatment:       Reason Eval/Treat Not Completed: Medical issues which prohibited therapy(Vtach, moving to ICU per MD). Per MD, pt being transferred to ICU. Will follow along.   Houston Siren 11/28/2017, 9:33 AM  Orbie Pyo Colvin Caroli.Ed [employer] [phone]

## 2017-11-28 NOTE — Care Management Note (Signed)
Case Management Note  Patient Details  Name: Dakota [name] MRN: [id number] Date of Birth: [birth year]   Subjective/Objective:   Admitted with GI Bleed/ hypotension. From CIR.  Hx of  PAF, CHB s/p PPM, GERD w/schatzki's ring, HTN, and recent admission for right MCA CVA s/p thrombectomy.             [name] (Daughter) Filmore Molyneux (Son)    [phone]               S/P   EGD 7/29 showing mild esophagitis, gastritis and non-bleeding duodenal ulcer        7/30  with large volume red bloody stool with drop in blood pressure, tx to ICU  PCP: [name]  Action/Plan:  CIR following for re admisssion... NCM  will continue to monitor  for transitional care needs.  Expected Discharge Date:                  Expected Discharge Plan:  [place]  In-House Referral:     Discharge planning Services  CM Consult  Post Acute Care Choice:    Choice offered to:     DME Arranged:    DME Agency:     HH Arranged:    [place] Agency:     Status of Service:  In process, will continue to follow  If discussed at Long Length of Stay Meetings, dates discussed:    Additional Comments:  Sharin Mons, RN 11/28/2017, 10:06 AM

## 2017-11-28 NOTE — Progress Notes (Signed)
[facility] PROGRESS NOTE Subjective Patient had an acute bleed on the floor with drop in his blood pressure.  This began after he had been on heparin.  The heparin was stopped and he was transferred to the ICU.  Since his transfer he has had no further bleeding.  Objective: Vital signs in last 24 hours: Temp:  [97.8 F (36.6 C)-98.7 F (37.1 C)] 97.9 F (36.6 C) (07/30 1046) Pulse Rate:  [79-141] 141 (07/30 1030) Resp:  [21-24] 24 (07/30 1030) BP: (107-146)/(55-91) 117/90 (07/30 1015) SpO2:  [95 %-100 %] 100 % (07/30 1030) Weight:  [175 lb 11.3 oz (79.7 kg)] 175 lb 11.3 oz (79.7 kg) (07/30 0409) Last BM Date: 11/28/17  Intake/Output from previous day: 07/29 0701 - 07/30 0700 In: 1206.7 [P.O.:120; I.V.:486.7; IV Piggyback:600] Out: 2325 [Urine:2325] Intake/Output this shift: Total I/O In: 557.8 [I.V.:357.8; IV Piggyback:200] Out: 351 [Urine:350; Stool:1]  PE: General--patient sedated.  Blood pressure much improved  Abdomen--soft and nontender  Lab Results: Recent Labs    11/25/17 2058 11/26/17 0705 11/26/17 1203 11/27/17 0602 11/28/17 0659  WBC 27.3* 19.9* 20.2* 14.9* 14.4*  HGB 9.4* 9.2* 8.9* 8.8* 7.8*  HCT 28.4* 27.8* 26.8* 25.8* 23.4*  PLT 193 180 182 180 238   BMET Recent Labs    11/26/17 0705 11/27/17 0602 11/28/17 0659  NA 138 136 136  K 4.1 3.5 3.6  CL 109 104 105  CO2 24 24 24   CREATININE 0.94 0.87 0.93   LFT No results for input(s): PROT, AST, ALT, ALKPHOS, BILITOT, BILIDIR, IBILI in the last 72 hours. PT/INR No results for input(s): LABPROT, INR in the last 72 hours. PANCREAS No results for input(s): LIPASE in the last 72 hours.       Studies/Results: No results found.  Medications: I have reviewed the patient's current medications.  Assessment:   1.  Acute bleed.  This is probably from the large duodenal ulcer that we found on EGD yesterday.  He was started back on heparin infusion and after turning off the infusion  within a couple hours his bleeding is stopped completely and his blood pressure is normal.  I do not think he needs an urgent EGD at this point in time.   Plan: 1.  We will observe in the ICU and continue Protonix.  I think it would be in his best interest to hold the anticoagulation.  We may need to hold the anticoagulation for a week or so. 2.  Have discussed repeat EGD and control of bleeding with his son.  We did ask his son to sign a permit which he did to allow [place] to do this procedure in the next 24 hours if he continues to bleed.   Nancy Fetter 11/28/2017, 12:16 PM  This note was created using voice recognition software. Minor errors may Have occurred unintentionally.  Pager: [phone] If no answer or after hours call [phone]

## 2017-11-28 NOTE — Procedures (Signed)
Central Venous Catheter Insertion Procedure Note Dakota [name] [id number] 07-25-1928  Procedure: Insertion of Central Venous Catheter Indications: Assessment of intravascular volume, Drug and/or fluid administration and Frequent blood sampling  Procedure Details Consent: Risks of procedure as well as the alternatives and risks of each were explained to the (patient/caregiver).  Consent for procedure obtained. Time Out: Verified patient identification, verified procedure, site/side was marked, verified correct patient position, special equipment/implants available, medications/allergies/relevent history reviewed, required imaging and test results available.  Performed  Maximum sterile technique was used including antiseptics, cap, gloves, gown, hand hygiene, mask and sheet. Skin prep: Chlorhexidine; local anesthetic administered A antimicrobial bonded/coated triple lumen catheter was placed in the right internal jugular vein using the Seldinger technique. Ultrasound guidance used.Yes.   Catheter placed to 17 cm. Blood aspirated via all 3 ports and then flushed x 3. Line sutured x 2 and dressing applied.  Evaluation Blood flow good Complications: No apparent complications Patient did tolerate procedure well. Chest X-ray ordered to verify placement.  CXR: pending    .rt ij site prior to insertion of cvl  [facility] ACNP Maryanna Shape PCCM Pager [phone] till 1 pm If no answer page [phone] 11/28/2017, 12:08 PM

## 2017-11-28 NOTE — Progress Notes (Signed)
PROGRESS NOTE  Dakota [name]  VOJ:[id number] DOB: December 03, 1928 DOA: 11/24/2017 PCP: Tonia Ghent, MD   Brief Narrative: Dakota [name] is an 84 y.o. male with a history of PAF, CHB s/p PPM, GERD w/schatzki's ring, HTN, and recent admission for right MCA CVA s/p thrombectomy, discharged to CIR on eliquis who developed melena/acute GI bleeding with hypotension, readmitted to [facility] 7/26. He was given IV fluids with improvement in BP, requiring 1u PRBCs for hemoglobin continuing to drop slowly in setting of melena despite holding eliquis. GI was consulted and is performed EGD 7/29 showing mild esophagitis, gastritis and non-bleeding duodenal ulcer. Plan was to start heparin with low therapeutic goal and monitor. The patient on 7/30 has had a very large volume red bloody stool with drop in blood pressure. Hemoglobin this morning was 7.8, roughly stable. Heparin has been stopped (levels have actually been subtherapeutic thus far), NS bolus given, 1u emergent release RBCs and stat T&S, ptt, and additional unit also ordered. Has PIVs bilaterally. Discussed with CCM who agrees with transfer to ICU.   Assessment & Plan: Principal Problem:   Hypovolemic shock ([place]) Active Problems:   HLD (hyperlipidemia)   Essential hypertension, benign   CAD, NATIVE VESSEL   Atrial fibrillation ([name])   CHB (complete heart block) - s/p MDT 1 lead PPM   Cerebrovascular accident (CVA) due to thrombosis of right middle cerebral artery ([place])   GIB (gastrointestinal bleeding)  Acute blood loss anemia:  - s/p 1u PRBCs on 7/26, and 7/27, now with large volume red stool 7/30 getting 2u PRBCs. Last hgb 7.8 this AM, repeating now.  - Stop heparin, STAT ptt to determine if protamine needed.  - Serial CBC.  Hypotension, Hx HTN: Initially felt to have hypotensive shock, improved with RBCs and volume, now recurrent with large volume stools.  - Holding beta blocker until bleeding resolved, BP stable.  - Discussed with CCM, will  transfer to ICU while patient unstable. Care to be assumed by CCM.   Melena and now BRBPR due to duodenal ulcer as well as gastritis and esophagitis noted on EGD 7/29:  - Continue protonix gtt x72 hours > then 80mg  IV daily - Follow up gastritis biopsy - Clear liquids per GI, though this is limited by dysphagia. Will have SLP evaluate for thickened consistency recommended.  Right CVA with residual deficits: s/p thrombectomy.  - Holding eliquis, will start lovenox as bridge to monitor for bleeding.  - Continue statin.  - PT/OT/SLP to be reordered once work up complete with plans to return to CIR.  Acute delirium:  - Continued seroquel qHS, stopped AM dose due to somnolence. Currently unable to take po medications, so will restart those once able. - Off depakote, xanax BID prn for NPO  - Delirium precautions  CAD: Fortunately no chest pain - Restarted BB, stopping now  Atrial fibrillation, complete heart block:  - Holding beta blocker (metoprolol was initially substituted for coreg) - Monitor K, Mg, keep >4 and >2 respectively. - Holding eliquis, stopping heparin as above.  Hyperlipidemia:  - Statin when taking po  Leukocytosis, UTI: Don't believe hypotension is due to sepsis. Started on bactrim at [facility] though has K beginning to elevate, Cr rising.  - Continue cipro. WBC improving. Plan was to transition to bactrim per susceptibility data (PCN allergy) once cleared to take po.   Smokeless tobacco use: ?nicotine withdrawal worsening behavioral abnormalities.  - Continue low dose nicotine patch, will increase dose today  Acute delirium: Hope to  improve with administration of po meds including depakote, xanax, seroquel  DVT prophylaxis: SCDs Code Status: Full Family Communication: Son at bedside Disposition Plan: Transfer to ICU.   Consultants:   GI  CCM  Surgery  Procedures:   EGD 11/27/2017: Findings:      Non-severe esophagitis with no bleeding was found in the lower  third of       the esophagus.      Localized moderate inflammation characterized by erosions and erythema       was found in the gastric antrum. Biopsies were taken with a cold forceps       for histology. The pathology specimen was placed into Bottle Number 2.      One partially obstructing non-bleeding cratered duodenal ulcer with no       stigmata of bleeding was found in the first portion of the duodenum. The       lesion was 15 mm in largest dimension. Biopsies were taken with a cold       forceps for histology. the ulcer was located at the junction of the bulb       and 2nd portion and had marked flamed edematous tissue around it. The       edges were biopsy was quite hard. The pathology specimen was placed into       Bottle Number 1. Impression:       - Non-severe esophagitis.                           - Gastritis. Biopsied.                           - One partially obstructing non-bleeding duodenal                            ulcer with no stigmata of bleeding. Biopsied. Recommendation: Clear liquid diet.                           - Use Protonix (pantoprazole) 80 mg IV daily.                           - would hold oral anticoagulants and consider                            Lovenox bridging for now  Antimicrobials:  None   Subjective: Large red bloody bowel movement, per RN "was nearly overflowing off the bed."     Objective: Vitals:   11/28/17 0409 11/28/17 0433 11/28/17 0800 11/28/17 0810  BP:  107/89  109/61  Pulse:  79 97 (!) 111  Resp:  (!) 23 (!) 21 (!) 22  Temp: 98.5 F (36.9 C) 98 F (36.7 C) 98.6 F (37 C)   TempSrc: Axillary Oral Oral   SpO2:  96% 99% 96%  Weight: 79.7 kg (175 lb 11.3 oz)     Height:        Intake/Output Summary (Last 24 hours) at 11/28/2017 0928 Last data filed at 11/28/2017 0858 Gross per 24 hour  Intake 1664.12 ml  Output 2676 ml  Net -1011.88 ml   Filed Weights   11/27/17 0645 11/27/17 1012 11/28/17 0409  Weight: 82.7 kg (182 lb  5.1 oz) 82.7 kg (  182 lb 5.1 oz) 79.7 kg (175 lb 11.3 oz)   Gen: Elderly male in no distress Pulm: Nonlabored breathing room air. Clear. CV: Irregular tachycardia, no new murmur, rub, or gallop. No JVD, trace dependent edema. GI: Abdomen soft, non-tender, non-distended, with normoactive bowel sounds. Ext: Warm, no deformities Skin: No new rashes, lesions or ulcers on visualized skin.  Neuro: Drowsy but rousable, left arm will move but neglect stable. Right gaze preference, not cooperative with exam. Psych: Judgement and insight impaired.   Data Reviewed: I have personally reviewed following labs and imaging studies  CBC: Recent Labs  Lab 11/22/17 0715 11/24/17 0436  11/25/17 2058 11/26/17 0705 11/26/17 1203 11/27/17 0602 11/28/17 0659  WBC 16.7* 15.2*   < > 27.3* 19.9* 20.2* 14.9* 14.4*  NEUTROABS 11.3* 9.8*  --   --   --   --   --   --   HGB 14.9 13.9   < > 9.4* 9.2* 8.9* 8.8* 7.8*  HCT 43.5 42.5   < > 28.4* 27.8* 26.8* 25.8* 23.4*  MCV 88.1 90.8   < > 88.2 88.3 88.4 88.1 89.0  PLT 198 238   < > 193 180 182 180 238   < > = values in this interval not displayed.   Basic Metabolic Panel: Recent Labs  Lab 11/22/17 0715 11/24/17 1532 11/26/17 0705 11/27/17 0602 11/28/17 0659  NA 137 138 138 136 136  K 3.9 4.8 4.1 3.5 3.6  CL 106 106 109 104 105  CO2 24 22 24 24 24   GLUCOSE 122* 124* 120* 104* 151*  BUN 27* 54* 24* 12 15  CREATININE 0.85 1.00 0.94 0.87 0.93  CALCIUM 8.8* 8.5* 8.3* 8.2* 7.8*   GFR: Estimated Creatinine Clearance: 57.7 mL/min (by C-G formula based on SCr of 0.93 mg/dL). Liver Function Tests: Recent Labs  Lab 11/22/17 0715  AST 24  ALT 31  ALKPHOS 49  BILITOT 1.4*  PROT 6.2*  ALBUMIN 2.6*   CBG: Recent Labs  Lab 11/25/17 0750 11/26/17 0758 11/27/17 0801 11/28/17 0801  GLUCAP 120* 107* 94 120*   Urine analysis:    Component Value Date/Time   COLORURINE YELLOW 11/21/2017 1810   APPEARANCEUR CLEAR 11/21/2017 1810   LABSPEC 1.017  11/21/2017 1810   PHURINE 5.0 11/21/2017 1810   GLUCOSEU NEGATIVE 11/21/2017 1810   HGBUR LARGE (A) 11/21/2017 1810   BILIRUBINUR NEGATIVE 11/21/2017 1810   KETONESUR NEGATIVE 11/21/2017 1810   PROTEINUR 30 (A) 11/21/2017 1810   UROBILINOGEN 1.0 12/29/2014 0816   NITRITE NEGATIVE 11/21/2017 1810   LEUKOCYTESUR NEGATIVE 11/21/2017 1810   Recent Results (from the past 240 hour(s))  Culture, Urine     Status: Abnormal   Collection Time: 11/22/17  3:10 AM  Result Value Ref Range Status   Specimen Description URINE, CLEAN CATCH  Final   Special Requests   Final    NONE Performed at [facility], [place] [address], [place], [place] [address]    Culture >=100,000 COLONIES/mL ENTEROBACTER CLOACAE (A)  Final   Report Status 11/24/2017 FINAL  Final   Organism ID, Bacteria ENTEROBACTER CLOACAE (A)  Final      Susceptibility   Enterobacter cloacae - MIC*    CEFAZOLIN >=64 RESISTANT Resistant     CEFTRIAXONE <=1 SENSITIVE Sensitive     CIPROFLOXACIN <=0.25 SENSITIVE Sensitive     GENTAMICIN <=1 SENSITIVE Sensitive     IMIPENEM <=0.25 SENSITIVE Sensitive     NITROFURANTOIN 64 INTERMEDIATE Intermediate     TRIMETH/SULFA <=  20 SENSITIVE Sensitive     PIP/TAZO <=4 SENSITIVE Sensitive     * >=100,000 COLONIES/mL ENTEROBACTER CLOACAE      Radiology Studies: No results found.  Scheduled Meds: . sodium chloride   Intravenous Once  . chlorhexidine  15 mL Mouth Rinse BID  . divalproex  250 mg Oral Q8H  . famotidine  20 mg Oral BID  . mouth rinse  15 mL Mouth Rinse q12n4p  . metoprolol tartrate  2.5 mg Intravenous Q8H  . nicotine  14 mg Transdermal Daily  . pantoprazole  40 mg Intravenous Q12H  . pravastatin  20 mg Oral q1800  . QUEtiapine  25 mg Oral QHS  . sodium chloride flush  3 mL Intravenous Q12H   Continuous Infusions: . ciprofloxacin Stopped (11/28/17 0703)  . sodium chloride 500 mL (11/28/17 0853)     LOS: 4 days   Time spent: 35 minutes.  Patrecia Pour, MD [facility] [email or web address] Password [email or web address] 11/28/2017, 9:28 AM

## 2017-11-29 ENCOUNTER — Inpatient Hospital Stay (HOSPITAL_COMMUNITY): Payer: Medicare Other

## 2017-11-29 DIAGNOSIS — I482 Chronic atrial fibrillation: Secondary | ICD-10-CM

## 2017-11-29 LAB — BASIC METABOLIC PANEL
Anion gap: 7 (ref 5–15)
BUN: 14 mg/dL (ref 8–23)
CHLORIDE: 107 mmol/L (ref 98–111)
CO2: 23 mmol/L (ref 22–32)
Calcium: 8 mg/dL — ABNORMAL LOW (ref 8.9–10.3)
Creatinine, Ser: 0.86 mg/dL (ref 0.61–1.24)
GFR calc Af Amer: >60 mL/min (ref >60)
GLUCOSE: 111 mg/dL — AB (ref 70–99)
POTASSIUM: 3.6 mmol/L (ref 3.5–5.1)
Sodium: 137 mmol/L (ref 135–145)

## 2017-11-29 LAB — BPAM RBC
BLOOD PRODUCT EXPIRATION DATE: 201908202359
Blood Product Expiration Date: 201908182359
ISSUE DATE / TIME: 201907300921
ISSUE DATE / TIME: 201907301315
UNIT TYPE AND RH: 6200
Unit Type and Rh: 9500

## 2017-11-29 LAB — CBC
HCT: 27.8 % — ABNORMAL LOW (ref 39.0–52.0)
HCT: 26.3 % — ABNORMAL LOW (ref 39.0–52.0)
HCT: 26.7 % — ABNORMAL LOW (ref 39.0–52.0)
HEMATOCRIT: 28.4 % — AB (ref 39.0–52.0)
HEMOGLOBIN: 9.8 g/dL — AB (ref 13.0–17.0)
HEMOGLOBIN: 9.1 g/dL — AB (ref 13.0–17.0)
Hemoglobin: 9.6 g/dL — ABNORMAL LOW (ref 13.0–17.0)
Hemoglobin: 9.1 g/dL — ABNORMAL LOW (ref 13.0–17.0)
MCH: 29.9 pg (ref 26.0–34.0)
MCH: 29.7 pg (ref 26.0–34.0)
MCH: 29.7 pg (ref 26.0–34.0)
MCH: 29.5 pg (ref 26.0–34.0)
MCHC: 34.5 g/dL (ref 30.0–36.0)
MCHC: 34.5 g/dL (ref 30.0–36.0)
MCHC: 34.6 g/dL (ref 30.0–36.0)
MCHC: 34.1 g/dL (ref 30.0–36.0)
MCV: 86.6 fL (ref 78.0–100.0)
MCV: 86.1 fL (ref 78.0–100.0)
MCV: 85.9 fL (ref 78.0–100.0)
MCV: 86.7 fL (ref 78.0–100.0)
PLATELETS: 207 10*3/uL (ref 150–400)
PLATELETS: 257 10*3/uL (ref 150–400)
Platelets: 230 10*3/uL (ref 150–400)
Platelets: 209 10*3/uL (ref 150–400)
RBC: 3.21 MIL/uL — AB (ref 4.22–5.81)
RBC: 3.3 MIL/uL — AB (ref 4.22–5.81)
RBC: 3.06 MIL/uL — ABNORMAL LOW (ref 4.22–5.81)
RBC: 3.08 MIL/uL — ABNORMAL LOW (ref 4.22–5.81)
RDW: 14.3 % (ref 11.5–15.5)
RDW: 14.1 % (ref 11.5–15.5)
RDW: 14.2 % (ref 11.5–15.5)
RDW: 14.2 % (ref 11.5–15.5)
WBC: 15.5 10*3/uL — AB (ref 4.0–10.5)
WBC: 16.3 10*3/uL — ABNORMAL HIGH (ref 4.0–10.5)
WBC: 15.5 10*3/uL — ABNORMAL HIGH (ref 4.0–10.5)
WBC: 18.1 10*3/uL — AB (ref 4.0–10.5)

## 2017-11-29 LAB — TYPE AND SCREEN
ABO/RH(D): A POS
Antibody Screen: NEGATIVE
UNIT DIVISION: 0
Unit division: 0

## 2017-11-29 LAB — PROTIME-INR
INR: 1.38
PROTHROMBIN TIME: 16.9 s — AB (ref 11.4–15.2)

## 2017-11-29 LAB — MAGNESIUM: Magnesium: 1.7 mg/dL (ref 1.7–2.4)

## 2017-11-29 LAB — PHOSPHORUS: Phosphorus: 2.2 mg/dL — ABNORMAL LOW (ref 2.5–4.6)

## 2017-11-29 LAB — GLUCOSE, CAPILLARY
GLUCOSE-CAPILLARY: 100 mg/dL — AB (ref 70–99)
GLUCOSE-CAPILLARY: 116 mg/dL — AB (ref 70–99)

## 2017-11-29 LAB — APTT: aPTT: 37 seconds — ABNORMAL HIGH (ref 24–36)

## 2017-11-29 LAB — HEPARIN LEVEL (UNFRACTIONATED)

## 2017-11-29 MED ORDER — SODIUM CHLORIDE 0.9% FLUSH
10.0000 mL | Freq: Two times a day (BID) | INTRAVENOUS | Status: DC
  Administered 2017-11-29 – 2017-12-03 (×5): 10 mL

## 2017-11-29 MED ORDER — SODIUM CHLORIDE 0.9% FLUSH: 10.0000 mL | INTRAVENOUS | Status: DC | PRN

## 2017-11-29 MED ORDER — CHLORHEXIDINE GLUCONATE CLOTH 2 % EX PADS
6.0000 | MEDICATED_PAD | Freq: Every day | CUTANEOUS | Status: DC
  Administered 2017-11-30 – 2017-12-03 (×4): 6 via TOPICAL

## 2017-11-29 NOTE — Evaluation (Signed)
Physical Therapy Evaluation Patient Details Name: Dakota [name] MRN: [id number] DOB: 07/11/1928 Today's Date: 11/29/2017   History of Present Illness  84 yo admitted with left weakness and right gaze preference with right basal ganglia infarct s/p revascularization. Admitted to CIR for rehab 7/23. Admitted to Acute 7/26 due to apparent GIB and transferred to ICU. 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, atrial fibrillation  Clinical Impression  PTA from CIR pt able to perform transfers utilizing Stedy with maxAx2, and required max A for performing BADLs. Pt currently is limited in safe mobility by L-sided pushing, L sided anosognosia, and decreased awareness of current deficits and confusion. Difficult to ascertain if decreased in awareness and command following is secondary to ICU environment and current medical condition. Pt was making progress with CIR prior to admittance back to hospital and plan is for pt to return to CIR once medically stable. However at current level of assist, pt will most likely require 24 hour close supervision for safety and maximal assistance for mobility at the end of CIR stay. PT will follow back in 2 days to assess progress as medical issues resolve.      Follow Up Recommendations CIR;Supervision/Assistance - 24 hour    Equipment Recommendations  Other (comment)(TBD)    Recommendations for Other Services       Precautions / Restrictions Precautions Precautions: Fall Precaution Comments: right gaze, left hemiparesis, left lean /pushing Restrictions Weight Bearing Restrictions: No      Mobility  Bed Mobility Overal bed mobility: Needs Assistance Bed Mobility: Supine to Sit Rolling: Total assist;+2 for physical assistance Sidelying to sit: Total assist;+2 for physical assistance Supine to sit: Total assist;+2 for physical assistance     General bed mobility comments: Total A +2; requires physical assist from PT to bring LE  towards EOB (L); attempts to go to R side of bed with max cueing to come towards L; use of bed pad for trunk/hip righting  Transfers Overall transfer level: Needs assistance Equipment used: 2 person hand held assist Transfers: Sit to/from Stand;Stand Pivot Transfers Sit to Stand: Max assist;+2 physical assistance Stand pivot transfers: Max assist;+2 physical assistance       General transfer comment: Sit to stand x 3 for ~2 min each trial for peri-care; on 3rd attempt does seem to bear more weight through B LE but does require B knee blocking during static stand due to reduced hip/knee flexion needed for stable upright balance        Balance Overall balance assessment: Needs assistance Sitting-balance support: Single extremity supported;Feet supported Sitting balance-Leahy Scale: Poor Sitting balance - Comments: assist ranging from max to min guard; prefers L UE to be behind back with with max tactile cueing to use UE for support; R UE reaching for end of bed for balance and scoots hips towards EOB/end of bed Postural control: Posterior lean;Left lateral lean Standing balance support: Bilateral upper extremity supported Standing balance-Leahy Scale: Zero Standing balance comment: B UE support in standing as well as knee blocking with max cueing for improved forward gaze and hip/knee extension                             Pertinent Vitals/Pain Pain Assessment: Faces Faces Pain Scale: Hurts a little bit Pain Location: general discomfort Pain Descriptors / Indicators: Guarding Pain Intervention(s): Limited activity within patient's tolerance    Home Living Family/patient expects to be discharged to:: Other (Comment)(CIR) Living  Arrangements: Spouse/significant other Available Help at Discharge: Family;Available 24 hours/day Type of Home: House Home Access: Ramped entrance     Home Layout: One level Home Equipment: Walker - 2 wheels;Transport chair;Bedside  commode Additional Comments: pt was primary caregiver for wife who does not walk, pt would transfer her and family assists with bathing    Prior Function Level of Independence: Needs assistance   Gait / Transfers Assistance Needed: maxAx2 for transfers with Stedy in CIR  ADL's / Homemaking Assistance Needed: maxA for BADLs in CIR   Comments: very independent, 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 Pt has 4 kids who can assist     Hand Dominance   Dominant Hand: Right    Extremity/Trunk Assessment   Upper Extremity Assessment Upper Extremity Assessment: LUE deficits/detail LUE Deficits / Details: Spontaneous movement LUE; apparent sensorimotor deficits with apparent limb apraxia LUE Sensation: (impaired) LUE Coordination: decreased fine motor;decreased gross motor(poor propriception)    Lower Extremity Assessment Lower Extremity Assessment: LLE deficits/detail RLE Deficits / Details: generalized weakness LLE Deficits / Details: spontaneous movement of L LE, difficulty with purposeful movement LLE Sensation: (impaired ) LLE Coordination: decreased fine motor;decreased gross motor(poor proprioception )    Cervical / Trunk Assessment Cervical / Trunk Assessment: Other exceptions Cervical / Trunk Exceptions: forward head  Communication   Communication: HOH(will further assess)  Cognition Arousal/Alertness: Awake/alert Behavior During Therapy: Restless;Flat affect;Impulsive Overall Cognitive Status: Impaired/Different from baseline Area of Impairment: Orientation;Attention;Memory;Following commands;Safety/judgement;Problem solving;Awareness                 Orientation Level: Disoriented to;Place;Time;Situation Current Attention Level: Focused Memory: Decreased short-term memory Following Commands: Follows one step commands inconsistently Safety/Judgement: Decreased awareness of safety;Decreased awareness of deficits Awareness: Intellectual Problem Solving: Slow  processing;Decreased initiation;Difficulty sequencing;Requires verbal cues;Requires tactile cues General Comments:        General Comments General comments (skin integrity, edema, etc.): in supine BP 147/86, HR 110 bpm, SaO@ on RA 100%O2, max HR with standing 145 bpm, Pt had bowel movement with standing with red blood, pt returned to bed and RN notified. with supine back in bed BP 113/75, HR 116bpm, SaO2 on RA 100%O2,         Assessment/Plan    PT Assessment Patient needs continued PT services  PT Problem List Decreased strength;Decreased mobility;Decreased safety awareness;Decreased range of motion;Decreased coordination;Decreased activity tolerance;Decreased cognition;Decreased balance;Decreased knowledge of use of DME;Impaired sensation       PT Treatment Interventions Gait training;DME instruction;Functional mobility training;Therapeutic activities;Therapeutic exercise;Balance training;Neuromuscular re-education;Cognitive remediation;Patient/family education    PT Goals (Current goals can be found in the Care Plan section)  Acute Rehab PT Goals Patient Stated Goal: go home and be with wife PT Goal Formulation: With family Time For Goal Achievement: 12/13/17 Potential to Achieve Goals: Fair    Frequency Min 3X/week   Barriers to discharge        Co-evaluation PT/OT/SLP Co-Evaluation/Treatment: Yes Reason for Co-Treatment: Complexity of the patient's impairments (multi-system involvement) PT goals addressed during session: Mobility/safety with mobility OT goals addressed during session: ADL's and self-care;Other (comment)(mobility)       AM-PAC PT "6 Clicks" Daily Activity  Outcome Measure Difficulty turning over in bed (including adjusting bedclothes, sheets and blankets)?: Unable Difficulty moving from lying on back to sitting on the side of the bed? : Unable Difficulty sitting down on and standing up from a chair with arms (e.g., wheelchair, bedside commode, etc,.)?:  Unable Help needed moving to and from a bed to chair (including a  wheelchair)?: Total Help needed walking in hospital room?: Total Help needed climbing 3-5 steps with a railing? : Total 6 Click Score: 6    End of Session Equipment Utilized During Treatment: Gait belt Activity Tolerance: Treatment limited secondary to medical complications (Comment)(bloody stool in standing ) Patient left: in chair;with call bell/phone within reach;with bed alarm set;with family/visitor present Nurse Communication: Mobility status PT Visit Diagnosis: Other abnormalities of gait and mobility (R26.89);Other symptoms and signs involving the nervous system (R29.898);Hemiplegia and hemiparesis;Apraxia (R48.2);Muscle weakness (generalized) (M62.81) Hemiplegia - Right/Left: Left Hemiplegia - dominant/non-dominant: Non-dominant Hemiplegia - caused by: Cerebral infarction    Time: [id number] PT Time Calculation (min) (ACUTE ONLY): 28 min   Charges:   PT Evaluation $PT Eval High Complexity: 1 High          Konstantinos Cordoba B. Migdalia Dk PT, DPT Acute Rehabilitation  [phone] Pager [phone]    [place] 11/29/2017, 12:14 PM

## 2017-11-29 NOTE — Progress Notes (Signed)
Inpatient Rehabilitation Admissions Coordinator  I continue to follow pt's progress to assist when appropriate.  Danne Baxter, RN, MSN Rehab Admissions Coordinator [phone] 11/29/2017 3:08 PM

## 2017-11-29 NOTE — Progress Notes (Signed)
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 Pt seen as co-eval with PT. Attempted to use Stedy to move pt to chair. During transfer, noted bloody discharge. HR 140s. Pt returned to supine and had a large bloody stool - nsg aware and assisted with cleaning pt and notified physician. Pt demonstrates a significant functional decline, requiring Max A +2 with use of Stedy and  Max to total A with ADL due to apparent L neglect, visual perceptual deficits sensorimotor deficits and cognitive deficits in addition to deficits listed below. Pt will require extensive rehab. Pt will benefit from rehab at [facility] however, due to significant impairement, feel he will most likely need 24/7 physical assistance after DC. Will continue to follow acutely to maximize functional level of independence and to further assess appropriate DC venue.     11/29/17 1100  OT Visit Information  Last OT Received On 11/29/17  Assistance Needed +2  PT/OT/SLP Co-Evaluation/Treatment Yes  Reason for Co-Treatment Complexity of the patient's impairments (multi-system involvement);Necessary to address cognition/behavior during functional activity;For patient/therapist safety;To address functional/ADL transfers  OT goals addressed during session ADL's and self-care;Other (comment) (mobility)  History of Present Illness 84 yo admitted with left weakness and right gaze preference with right basal ganglia infarct s/p revascularization. Admitted to CIR for rehab 7/23. Admitted to Acute 7/26 due to apparent GIB and transferred to ICU. 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, atrial fibrillation  Precautions  Precautions Fall  Precaution Comments right gaze, left hemiparesis, left lean /pushing  Restrictions  Weight Bearing Restrictions No  Home Living  Family/patient expects to be discharged to: Other (Comment) (CIR)  Living Arrangements Spouse/significant other  Available Help at Discharge  Family;Available 24 hours/day  Type of [place] One level  Bathroom Shower/Tub Walk-in shower;Tub/shower unit  Corporate treasurer Yes  How Accessible Accessible via walker  [place] - 2 wheels;Transport chair;BSC  Additional Comments 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 Pt has 4 kids who can assist  Communication  Communication HOH (will further assess)  Pain Assessment  Pain Assessment Faces  Faces Pain Scale 2  Pain Location general discomfort  Pain Descriptors / Indicators Guarding  Pain Intervention(s) Limited activity within patient's tolerance  Cognition  Arousal/Alertness Awake/alert  Behavior During Therapy Restless;Flat affect;Impulsive  Overall Cognitive Status Impaired/Different from baseline  Area of Impairment Orientation;Attention;Memory;Following commands;Safety/judgement;Problem solving;Awareness  Orientation Level Disoriented to;Place;Time;Situation  Current Attention Level Focused  Memory Decreased short-term memory  Following Commands Follows one step commands inconsistently  Safety/Judgement Decreased awareness of safety;Decreased awareness of deficits  Awareness Intellectual  Problem Solving Slow processing;Decreased initiation;Difficulty sequencing;Requires verbal cues;Requires tactile cues  General Comments    Upper Extremity Assessment  Upper Extremity Assessment LUE deficits/detail  LUE Deficits / Details Spontaneous movement LUE; apparent sensorimotor deficits with  apparent limb apraxia  LUE Sensation  (impaired)  LUE Coordination decreased fine motor;decreased gross motor (poor propriception)  Lower Extremity Assessment  Lower Extremity Assessment Defer to PT evaluation  Cervical /  Trunk Assessment  Cervical / Trunk Assessment Other exceptions  Cervical / Trunk Exceptions forward head (L bias)  ADL  Overall ADL's  Needs assistance/impaired  Eating/Feeding NPO (after session)  Eating/Feeding Details (indicate cue type and reason) dentures removed  Grooming Oral care;Sitting;Moderate assistance  Grooming Details (indicate cue type and reason) able to use swab for oral care using R dominant hand; physical cues to terminate activity  Upper Body Bathing Maximal assistance  Lower Body Bathing Total assistance  Upper Body Dressing  Maximal assistance  Lower Body Dressing Total assistance;Bed level  Toilet Transfer +2 for physical assistance;Maximal assistance  Toileting- Clothing Manipulation and Hygiene Total assistance;Sit to/from stand  Toileting - Clothing Manipulation Details (indicate cue type and reason) incontinenet of apparent large melanic stool; nsg aware  Functional mobility during ADLs Maximal assistance;+2 for physical assistance  Vision- Assessment  Vision Assessment? Yes  Eye Alignment Impaired (comment)  Ocular Range of Motion [facility]  Alignment/Gaze Preference Gaze right  Tracking/Visual Pursuits Decreased smoothness of vertical tracking;Decreased smoothness of horizontal tracking  Saccades Decreased speed of saccadic movement;Impaired - to be further tested in functional context  Visual Fields Impaired-to be further tested in functional context  Additional Comments r gaze preference; Able to scan to L, but poor visual attention  Perception  Spatial deficits Poor awareness of midline; pushes L; Able to move to midline but unable to maintain midline postural control  Comments Will further assess; L negect  Praxis  Praxis tested?  Deficits  Deficits Initiation;Organization;Limb apraxia;Perseveration  Bed Mobility  Overal bed mobility Needs Assistance  Bed Mobility Supine to Sit  Rolling +2 for physical assistance;Max assist  Sidelying to sit +2 for physical assistance;Max assist  Supine to sit +2 for physical assistance;Max assist  Transfers  Overall transfer level Needs assistance  Equipment used 2 person hand held assist  Transfer via [place] (+2 to exit bed)  Transfers Sit to/from Stand  Sit to Stand Max assist;+2 physical assistance (with Stedy)  General transfer comment Attempted to use Stedy. While standing in stedy, pt trying to step/picking up legs in stedy, requiring Max A to maintain upright posture  Balance  Overall balance assessment Needs assistance  Sitting-balance support Single extremity supported;Feet supported  Sitting balance-Leahy Scale Poor  Postural control Posterior lean;Left lateral lean  Standing balance support Bilateral upper extremity supported  Standing balance-Leahy Scale Zero  OT - End of Session  Equipment Utilized During Treatment Gait belt  Activity Tolerance Patient tolerated treatment well  Patient left with family/visitor present;with bed alarm set;in bed;with call bell/phone within reach;with restraints reapplied  Nurse Communication Mobility status;Precautions;Other (comment) (bloody BM)  OT Assessment  OT Recommendation/Assessment Patient needs continued OT Services  OT Visit Diagnosis Unsteadiness on feet (R26.81);Cognitive communication deficit (R41.841);Hemiplegia and hemiparesis;Other abnormalities of gait and mobility (R26.89);Muscle weakness (generalized) (M62.81);Apraxia (R48.2);Other symptoms and signs involving cognitive function  Symptoms and signs involving cognitive functions Cerebral infarction  Hemiplegia - Right/Left Left  Hemiplegia - dominant/non-dominant Non-Dominant  Hemiplegia - caused by Cerebral infarction  OT Problem List Decreased  strength;Decreased range of motion;Decreased activity tolerance;Impaired balance (sitting and/or standing);Impaired vision/perception;Decreased coordination;Decreased cognition;Decreased safety awareness;Decreased knowledge of use of DME or AE;Decreased knowledge of precautions;Obesity;Impaired UE functional use;Impaired sensation  Barriers to Discharge Decreased caregiver support  OT Plan  OT Frequency (ACUTE ONLY) Min 3X/week  OT Treatment/Interventions (ACUTE ONLY) Self-care/ADL training;Therapeutic exercise;Neuromuscular education;DME and/or AE instruction;Therapeutic activities;Cognitive remediation/compensation;Visual/perceptual remediation/compensation;Patient/family education;Balance training  AM-PAC OT "6 Clicks" Daily Activity Outcome Measure  Help from another person  eating meals? 1  Help from another person taking care of personal grooming? 2  Help from another person toileting, which includes using toliet, bedpan, or urinal? 1  Help from another person bathing (including washing, rinsing, drying)? 1  Help from another person to put on and taking off regular upper body clothing? 1  Help from another person to put on and taking off regular lower body clothing? 1  6 Click Score 7  ADL G Code Conversion CM  OT Recommendation  Recommendations for Other Services Rehab consult  Follow Up Recommendations CIR;Supervision/Assistance - 24 hour  OT Equipment 3 in 1 bedside commode;Wheelchair (measurements OT);Wheelchair cushion (measurements OT);Hospital bed  Individuals Consulted  Consulted and Agree with Results and Recommendations Family member/caregiver;Patient  Family Member Consulted son Laurey Arrow)  Acute Rehab OT Goals  Patient Stated Goal per son, to get better  OT Goal Formulation Patient unable to participate in goal setting  Time For Goal Achievement 12/13/17  Potential to Achieve Goals Good  OT Time Calculation  OT Start Time (ACUTE ONLY) 1010  OT Stop Time (ACUTE ONLY) 1042  OT  Time Calculation (min) 32 min  OT General Charges  $OT Visit 1 Visit  OT Evaluation  $OT Eval Moderate Complexity 1 Mod  Written Expression  Dominant Hand Right   Maurie Boettcher, OT/L  OT Clinical Specialist [phone]

## 2017-11-29 NOTE — Progress Notes (Signed)
PULMONARY / CRITICAL CARE MEDICINE   Name: Dakota [name] MRN: [id number] DOB: 30-Jun-1928    ADMISSION DATE:  11/24/2017 CONSULTATION DATE:  11/28/2017  REFERRING MD:  [facility]  CHIEF COMPLAINT:  Bleeding, low blood pressure  HISTORY OF PRESENT ILLNESS:   84 y/o male was admitted on 7/19 with an acute R MCA stroke requiring revascularization in interventional radiology, then he was transferred to a rehab facility where he developed a GI bleed.  He was sent back to [facility] for further evaluation.  Anticoagulation for secondary stroke prevention (afib) was held on admission but re-introduced again.  He developed a GI bleed again and was moved to the ICU.  SUBJECTIVE:  Trace melena noted overnight Hemodynamically stable Wants to eat  VITAL SIGNS: BP (!) 140/108   Pulse (!) 134   Temp 98.3 F (36.8 C) (Oral)   Resp 20   Ht 5\' 7"  (1.702 m)   Wt 81.9 kg (180 lb 8.9 oz)   SpO2 100%   BMI 28.28 kg/m   HEMODYNAMICS: CVP:  [4 mmHg-8 mmHg] 6 mmHg  VENTILATOR SETTINGS:    INTAKE / OUTPUT: I/O last 3 completed shifts: In: 8299 [I.V.:2870; Blood:1080; IV Piggyback:400] Out: 3716 [Urine:3475; Stool:1]  PHYSICAL EXAMINATION:  General:  Resting comfortably in bed HENT: NCAT OP clear PULM: CTA B, normal effort CV: RRR, no mgr GI: BS+, soft, nontender MSK: normal bulk and tone Neuro: confused, but follows commands   LABS:  BMET Recent Labs  Lab 11/27/17 0602 11/28/17 0659 11/29/17 0512  NA 136 136 137  K 3.5 3.6 3.6  CL 104 105 107  CO2 24 24 23   BUN 12 15 14   CREATININE 0.87 0.93 0.86  GLUCOSE 104* 151* 111*    Electrolytes Recent Labs  Lab 11/27/17 0602 11/28/17 0659 11/29/17 0512  CALCIUM 8.2* 7.8* 8.0*  MG  --   --  1.7  PHOS  --   --  2.2*    CBC Recent Labs  Lab 11/28/17 2102 11/29/17 0100 11/29/17 0512  WBC 16.1* 15.5* 16.3*  HGB 9.8* 9.6* 9.8*  HCT 28.5* 27.8* 28.4*  PLT 215 207 230    Coag's Recent Labs  Lab  11/27/17 2340 11/28/17 2102 11/29/17 0512  APTT 70* 35 37*  INR  --   --  1.38    Sepsis Markers No results for input(s): LATICACIDVEN, PROCALCITON, O2SATVEN in the last 168 hours.  ABG No results for input(s): PHART, PCO2ART, PO2ART in the last 168 hours.  Liver Enzymes No results for input(s): AST, ALT, ALKPHOS, BILITOT, ALBUMIN in the last 168 hours.  Cardiac Enzymes No results for input(s): TROPONINI, PROBNP in the last 168 hours.  Glucose Recent Labs  Lab 11/28/17 0801 11/28/17 1030 11/28/17 1547 11/28/17 1945 11/28/17 2351 11/29/17 0733  GLUCAP 120* 101* 94 104* 100* 116*    Imaging Dg Chest 1 View  Result Date: 11/28/2017 CLINICAL DATA:  Status post PICC line placement. EXAM: CHEST  1 VIEW COMPARISON:  Radiographs of November 21, 2017. FINDINGS: Stable cardiomegaly. Single lead left-sided pacemaker is unchanged in position. Interval placement of right internal jugular catheter with distal tip in expected position of the SVC. No pneumothorax or pleural effusion is noted. Bony thorax is unremarkable. IMPRESSION: Interval placement of right internal jugular catheter with distal tip in expected position of the SVC. No pneumothorax is noted. Electronically Signed   By: Marijo Conception, M.D.   On: 11/28/2017 12:40   Dg Chest [facility]  Result Date: 11/29/2017 CLINICAL DATA:  Respiratory failure EXAM: PORTABLE CHEST 1 VIEW COMPARISON:  11/28/2017 FINDINGS: Left pacer and right central line remain in place, unchanged. Heart is upper limits normal in size. Lungs clear. No effusions or acute bony abnormality. IMPRESSION: No active disease. Electronically Signed   By: Rolm Baptise M.D.   On: 11/29/2017 07:20      STUDIES:  7/29 EGD > large duodenal ulcer, path negative  CULTURES: 11/24/2017 urine culture positive for Enterobacter Cloace sensitive to ciprofloxacin  ANTIBIOTICS: 11/25/2017 Cipro for UTI>>  SIGNIFICANT EVENTS: 11/28/2017 recurrent GI  bleed  LINES/TUBES:    DISCUSSION: 84 y/o male with GI bleeding in the setting of being on anticoagulation after a recent R MCA stroke, newly diagnosed Afib.   ASSESSMENT / PLAN:  PULMONARY A: No acute issues Dysphagia P:   Monitor respiratory status Dysphagia 1 diet, nectar thick liquids  CARDIOVASCULAR A:  Afib Hemorrhagic shock> improved P:  Tele Monitor hemodynamics  RENAL A:   No acute issues P:   Monitor BMET and UOP Replace electrolytes as needed   GASTROINTESTINAL A:   Acute GI bleeding Duodenal ulcer  Esophageal stricture GERD P:   Hold anticoagulation Continue PPI Dysphagia 1 diet F/U GI medicine recommendations   HEMATOLOGIC A:   Hemorrhagic anemia P:  Monitor for bleeding Transfuse PRBC for Hgb < 7 gm/dL   INFECTIOUS A:   UTI ? Cultures negative P:   Stop cipro  ENDOCRINE A:   No acute issues  P:   Monitor glucose   NEUROLOGIC A:   Recent R MCA stroke P:   Holding anticoagulation Will need ongoing PT/SLP therapy/OT   FAMILY  - Updates: none bedside  Move back to tele, TRH service  Roselie Awkward, MD [place] [facility]M Pager: [phone] Cell: [phone] After 3pm or if no response, call [phone]  11/29/2017, 8:02 AM

## 2017-11-29 NOTE — Progress Notes (Signed)
SLP Cancellation Note  Patient Details Name: TANOR GLASPY MRN: [id number] DOB: 1928-10-02   Cancelled treatment:       Reason Eval/Treat Not Completed: Medical issues which prohibited therapy, continue NPO except meds per GI.  SLP will follow for readiness for repeat swallow assessment.   Juan Quam Laurice 11/29/2017, 3:47 PM

## 2017-11-29 NOTE — Progress Notes (Signed)
[facility] PROGRESS NOTE Subjective Patient did fairly well overnight without gross bleeding was given some food this morning and had a maroonish dark bowel movement.  Objective: Vital signs in last 24 hours: Temp:  [97.9 F (36.6 C)-98.8 F (37.1 C)] 98 F (36.7 C) (07/31 1127) Pulse Rate:  [52-136] 107 (07/31 1100) Resp:  [15-29] 22 (07/31 1100) BP: (73-184)/(53-118) 108/82 (07/31 1100) SpO2:  [79 %-100 %] 100 % (07/31 1100) Weight:  [81.9 kg (180 lb 8.9 oz)] 81.9 kg (180 lb 8.9 oz) (07/31 0500) Last BM Date: 11/28/17  Intake/Output from previous day: 07/30 0701 - 07/31 0700 In: 4466 [I.V.:2986; Blood:1080; IV Piggyback:400] Out: 2651 [Urine:2650; Stool:1] Intake/Output this shift: Total I/O In: 521.7 [P.O.:150; I.V.:371.7] Out: 200 [Urine:200]  PE: General--no acute distress  Abdomen--nontender  Lab Results: Recent Labs    11/28/17 0659 11/28/17 2102 11/29/17 0100 11/29/17 0512 11/29/17 1221  WBC 14.4* 16.1* 15.5* 16.3* 15.5*  HGB 7.8* 9.8* 9.6* 9.8* 9.1*  HCT 23.4* 28.5* 27.8* 28.4* 26.3*  PLT 238 215 207 230 209   BMET Recent Labs    11/27/17 0602 11/28/17 0659 11/29/17 0512  NA 136 136 137  K 3.5 3.6 3.6  CL 104 105 107  CO2 24 24 23   CREATININE 0.87 0.93 0.86   LFT No results for input(s): PROT, AST, ALT, ALKPHOS, BILITOT, BILIDIR, IBILI in the last 72 hours. PT/INR Recent Labs    11/29/17 0512  LABPROT 16.9*  INR 1.38   PANCREAS No results for input(s): LIPASE in the last 72 hours.       Studies/Results: Dg Chest 1 View  Result Date: 11/28/2017 CLINICAL DATA:  Status post PICC line placement. EXAM: CHEST  1 VIEW COMPARISON:  Radiographs of November 21, 2017. FINDINGS: Stable cardiomegaly. Single lead left-sided pacemaker is unchanged in position. Interval placement of right internal jugular catheter with distal tip in expected position of the SVC. No pneumothorax or pleural effusion is noted. Bony thorax is unremarkable.  IMPRESSION: Interval placement of right internal jugular catheter with distal tip in expected position of the SVC. No pneumothorax is noted. Electronically Signed   By: Marijo Conception, M.D.   On: 11/28/2017 12:40   Dg Chest Port 1 View  Result Date: 11/29/2017 CLINICAL DATA:  Respiratory failure EXAM: PORTABLE CHEST 1 VIEW COMPARISON:  11/28/2017 FINDINGS: Left pacer and right central line remain in place, unchanged. Heart is upper limits normal in size. Lungs clear. No effusions or acute bony abnormality. IMPRESSION: No active disease. Electronically Signed   By: Rolm Baptise M.D.   On: 11/29/2017 07:20    Medications: I have reviewed the patient's current medications.  Assessment:   1. G.I. Bleed. Patient had a deep, large duodenal ulcer at the junction of the 2nd duodenum and bulb. Biopsies were benign. He did fairly well till he started back on anticoagulation. Bled some last night. 2. Recent stroke has been on anticoagulation   Plan: His anticoagulation is on hold. I have discussed with his son and with Dr. Lake Bells, don't feel that he will be able to be anticoagulated for some time. Hopefully now that he is off anticoagulation again the bleeding will be able to be controlled. Until we're sure that he is stopped I would keep him NPO other than medications.   Nancy Fetter 11/29/2017, 12:51 PM  This note was created using voice recognition software. Minor errors may Have occurred unintentionally.  Pager: [phone] If no answer or after hours call [phone]

## 2017-11-29 NOTE — Progress Notes (Signed)
Pt w/ large melenic stool with bright red element. Dr. Lake Bells informed and Dr. Oletta Lamas at bs shortly after and updated. Orders recvd.

## 2017-11-30 DIAGNOSIS — I480 Paroxysmal atrial fibrillation: Secondary | ICD-10-CM

## 2017-11-30 DIAGNOSIS — I442 Atrioventricular block, complete: Secondary | ICD-10-CM

## 2017-11-30 LAB — HEMOGLOBIN AND HEMATOCRIT, BLOOD
HEMATOCRIT: 25.6 % — AB (ref 39.0–52.0)
Hemoglobin: 8.6 g/dL — ABNORMAL LOW (ref 13.0–17.0)

## 2017-11-30 LAB — BASIC METABOLIC PANEL
Anion gap: 8 (ref 5–15)
BUN: 7 mg/dL — ABNORMAL LOW (ref 8–23)
CO2: 25 mmol/L (ref 22–32)
Calcium: 8.3 mg/dL — ABNORMAL LOW (ref 8.9–10.3)
Chloride: 106 mmol/L (ref 98–111)
Creatinine, Ser: 0.84 mg/dL (ref 0.61–1.24)
GFR calc non Af Amer: >60 mL/min (ref >60)
Glucose, Bld: 116 mg/dL — ABNORMAL HIGH (ref 70–99)
Potassium: 3.4 mmol/L — ABNORMAL LOW (ref 3.5–5.1)
SODIUM: 139 mmol/L (ref 135–145)

## 2017-11-30 LAB — CBC WITH DIFFERENTIAL/PLATELET
Abs Immature Granulocytes: 0.7 10*3/uL — ABNORMAL HIGH (ref 0.0–0.1)
BASOS ABS: 0.1 10*3/uL (ref 0.0–0.1)
Basophils Relative: 1 %
Eosinophils Absolute: 0.3 10*3/uL (ref 0.0–0.7)
Eosinophils Relative: 2 %
HCT: 26.9 % — ABNORMAL LOW (ref 39.0–52.0)
Hemoglobin: 9.1 g/dL — ABNORMAL LOW (ref 13.0–17.0)
Immature Granulocytes: 4 %
LYMPHS ABS: 2.7 10*3/uL (ref 0.7–4.0)
LYMPHS PCT: 16 %
MCH: 29.7 pg (ref 26.0–34.0)
MCHC: 33.8 g/dL (ref 30.0–36.0)
MCV: 87.9 fL (ref 78.0–100.0)
Monocytes Absolute: 1.8 10*3/uL — ABNORMAL HIGH (ref 0.1–1.0)
Monocytes Relative: 10 %
NEUTROS ABS: 11.5 10*3/uL — AB (ref 1.7–7.7)
Neutrophils Relative %: 67 %
Platelets: 256 10*3/uL (ref 150–400)
RBC: 3.06 MIL/uL — ABNORMAL LOW (ref 4.22–5.81)
RDW: 14.3 % (ref 11.5–15.5)
WBC: 17.1 10*3/uL — ABNORMAL HIGH (ref 4.0–10.5)

## 2017-11-30 LAB — GLUCOSE, CAPILLARY: GLUCOSE-CAPILLARY: 112 mg/dL — AB (ref 70–99)

## 2017-11-30 MED ORDER — NICOTINE 7 MG/24HR TD PT24
7.0000 mg | MEDICATED_PATCH | Freq: Every day | TRANSDERMAL | Status: DC
  Administered 2017-11-30 – 2017-12-04 (×5): 7 mg via TRANSDERMAL
  Filled 2017-11-30 (×5): qty 1

## 2017-11-30 MED ORDER — POTASSIUM CHLORIDE 10 MEQ/50ML IV SOLN
10.0000 meq | INTRAVENOUS | Status: AC
  Administered 2017-11-30 (×2): 10 meq via INTRAVENOUS
  Filled 2017-11-30 (×2): qty 50

## 2017-11-30 MED ORDER — QUETIAPINE FUMARATE 25 MG PO TABS
25.0000 mg | ORAL_TABLET | Freq: Two times a day (BID) | ORAL | Status: DC
  Administered 2017-11-30: 25 mg via ORAL
  Filled 2017-11-30: qty 1

## 2017-11-30 NOTE — Progress Notes (Signed)
Patient transferred to [place] on cardiac monitor with NT and this RN without complication. Belongings taken with patient. VSS on transfer. Report given via phone to receiving RN. Family at bedside during transfer. Meds and chart sent.

## 2017-11-30 NOTE — Progress Notes (Signed)
[facility] ADULT ICU REPLACEMENT PROTOCOL FOR AM LAB REPLACEMENT ONLY  The patient does apply for the [facility] Adult ICU Electrolyte Replacment Protocol based on the criteria listed below:   1. Is GFR >/= 40 ml/min? Yes.    Patient's GFR today is >60 2. Is urine output >/= 0.5 ml/kg/hr for the last 6 hours? Yes.   Patient's UOP is 1.85 ml/kg/hr 3. Is BUN < 60 mg/dL? Yes.    Patient's BUN today is 7 4. Abnormal electrolyte  K 3.4 5. Ordered repletion with: per protocol 6. If a panic level lab has been reported, has the CCM MD in charge been notified? Yes.  .   Physician:  Tenny Craw 11/30/2017 6:27 AM

## 2017-11-30 NOTE — Progress Notes (Signed)
Spoke with RN Vaughan Basta and RN is aware of the D/C central line order

## 2017-11-30 NOTE — Progress Notes (Signed)
PULMONARY / CRITICAL CARE MEDICINE   Name: Dakota [name] MRN: [id number] DOB: [birth year]    ADMISSION DATE:  11/24/2017 CONSULTATION DATE:  11/28/2017  REFERRING MD:  [facility]  CHIEF COMPLAINT:  Bleeding, low blood pressure  HISTORY OF PRESENT ILLNESS:   84 y/o male was admitted on 7/19 with an acute R MCA stroke requiring revascularization in interventional radiology, then he was transferred to a rehab facility where he developed a GI bleed.  He was sent back to [facility] for further evaluation.  Anticoagulation for secondary stroke prevention (afib) was held on admission but re-introduced again.  He developed a GI bleed again and was moved to the ICU.  SUBJECTIVE:  Had a melanotic stool yesterday, hemoglobin stable Lots of delirium yesterday  VITAL SIGNS: BP (!) 127/102   Pulse 90   Temp 98.4 F (36.9 C) (Oral)   Resp 14   Ht 5\' 7"  (1.702 m)   Wt 81 kg (178 lb 9.2 oz)   SpO2 100%   BMI 27.97 kg/m   HEMODYNAMICS: CVP:  [3 mmHg-8 mmHg] 4 mmHg  VENTILATOR SETTINGS:    INTAKE / OUTPUT: I/O last 3 completed shifts: In: 5791.5 [P.O.:150; I.V.:5391.5; IV Piggyback:250] Out: 1829 [Urine:3850]  PHYSICAL EXAMINATION:  General:  Resting comfortably in bed HENT: NCAT OP clear PULM: CTA B, normal effort CV: Irreg irreg no mgr GI: BS+, soft, nontender MSK: normal bulk and tone Neuro: sleepy, drowsy    LABS:  BMET Recent Labs  Lab 11/28/17 0659 11/29/17 0512 11/30/17 0507  NA 136 137 139  K 3.6 3.6 3.4*  CL 105 107 106  CO2 24 23 25   BUN 15 14 7*  CREATININE 0.93 0.86 0.84  GLUCOSE 151* 111* 116*    Electrolytes Recent Labs  Lab 11/28/17 0659 11/29/17 0512 11/30/17 0507  CALCIUM 7.8* 8.0* 8.3*  MG  --  1.7  --   PHOS  --  2.2*  --     CBC Recent Labs  Lab 11/29/17 1221 11/29/17 1813 11/30/17 0041 11/30/17 0507  WBC 15.5* 18.1*  --  17.1*  HGB 9.1* 9.1* 8.6* 9.1*  HCT 26.3* 26.7* 25.6* 26.9*  PLT 209 257  --  256     Coag's Recent Labs  Lab 11/27/17 2340 11/28/17 2102 11/29/17 0512  APTT 70* 35 37*  INR  --   --  1.38    Sepsis Markers No results for input(s): LATICACIDVEN, PROCALCITON, O2SATVEN in the last 168 hours.  ABG No results for input(s): PHART, PCO2ART, PO2ART in the last 168 hours.  Liver Enzymes No results for input(s): AST, ALT, ALKPHOS, BILITOT, ALBUMIN in the last 168 hours.  Cardiac Enzymes No results for input(s): TROPONINI, PROBNP in the last 168 hours.  Glucose Recent Labs  Lab 11/28/17 1030 11/28/17 1547 11/28/17 1945 11/28/17 2351 11/29/17 0733 11/30/17 0729  GLUCAP 101* 94 104* 100* 116* 112*    Imaging No results found.    STUDIES:  7/29 EGD > large duodenal ulcer, path negative  CULTURES: 11/24/2017 urine culture positive for Enterobacter Cloace sensitive to ciprofloxacin  ANTIBIOTICS: 11/25/2017 Cipro for UTI>>  SIGNIFICANT EVENTS: 11/28/2017 recurrent GI bleed  LINES/TUBES:    DISCUSSION: 84 y/o male with GI bleeding in the setting of being on anticoagulation after a recent R MCA stroke, newly diagnosed Afib.   ASSESSMENT / PLAN:  PULMONARY A: No acute issues Dysphagia P:   Monitor respiratory status Dysphagia 1 diet  CARDIOVASCULAR A:  Afib Hemorrhagic shock> improved P:  Tele Monitor hemodynamics  RENAL A:   No acute issues P:   Monitor BMET and UOP Replace electrolytes as needed    GASTROINTESTINAL A:   Acute GI bleeding > hemoglobin stable, no evidence of ongoing bleeding Duodenal ulcer  Esophageal stricture GERD P:   Do not re-introduce anticoagulation Continue PPI > IV drip until tomorrow then IV q12 on 8/2 Dysphagia 1 diet  HEMATOLOGIC A:   Hemorrhagic anemia P:  Monitor for bleeding Transfuse PRBC for Hgb < 7 gm/dL   INFECTIOUS A:   UTI ? Cultures negative P:   Monitor off of antibiotics  ENDOCRINE A:   No acute issues  P:   Monitor glucose   NEUROLOGIC A:   Recent R MCA  stroke New severe acute delirium overnight 7/31 P:   Will need ongoing PT/SLP/OT  Restart seroquel Frequent orientation  FAMILY  - Updates: none bedside  Move back to tele, [place], MD [place] [facility]M Pager: [phone] Cell: [phone] After 3pm or if no response, call [phone]  11/30/2017, 9:22 AM

## 2017-11-30 NOTE — Progress Notes (Signed)
RN notes heavy sleep, some central apneas, will arouse to voice Plan: hold seroquel for now Change transfer to SDU, not tele

## 2017-11-30 NOTE — Progress Notes (Signed)
Periods of apnea noted, 15-30 sec. Will arouse to loud voice and sternal rub. CCM called.

## 2017-11-30 NOTE — Evaluation (Signed)
Clinical/Bedside Swallow Evaluation Patient Details  Name: Dakota [name] MRN: [id number] Date of Birth: Jan 10, 1929  Today's Date: 11/30/2017 Time: SLP Start Time (ACUTE ONLY): 38 SLP Stop Time (ACUTE ONLY): 1117 SLP Time Calculation (min) (ACUTE ONLY): 12 min  Past Medical History:  Past Medical History:  Diagnosis Date  . Anxiety   . Back pain, chronic   . Complete heart block ([place]) 07/18/2014   Medtronic [place] model [id number] (serial number [id number])  singe lead PPM  . Esophageal obstruction due to food impaction 2017  . GERD (gastroesophageal reflux disease) 10/2002  . Hyperlipidemia 12/1994  . Hypertension   . Hypokalemia   . Lung collapse 03/1991   Fall  . Permanent atrial fibrillation ([name])    Refused coumadin therapy   Past Surgical History:  Past Surgical History:  Procedure Laterality Date  . BACK SURGERY  11/91   with hardware fixation  . BIOPSY  11/27/2017   Procedure: BIOPSY;  Surgeon: Laurence Spates, MD;  Location: [facility];  Service: Endoscopy;;  . CARDIAC CATHETERIZATION  06/2004   30 % stenosis, EF normal  . ESOPHAGOGASTRODUODENOSCOPY N/A 01/06/2016   Procedure: ESOPHAGOGASTRODUODENOSCOPY (EGD);  Surgeon: Otis Brace, MD;  Location: [facility];  Service: Gastroenterology;  Laterality: N/A;  . ESOPHAGOGASTRODUODENOSCOPY N/A 11/27/2017   Procedure: ESOPHAGOGASTRODUODENOSCOPY (EGD);  Surgeon: Laurence Spates, MD;  Location[facility] ENDOSCOPY;  Service: Endoscopy;  Laterality: N/A;  . EYE SURGERY     Retinal bubble surgery, cataract   . IR CT HEAD LTD  11/16/2017  . IR PERCUTANEOUS ART THROMBECTOMY/INFUSION INTRACRANIAL INC DIAG ANGIO  11/16/2017  . LEFT HEART CATHETERIZATION WITH CORONARY ANGIOGRAM N/A 07/18/2014   Procedure: LEFT HEART CATHETERIZATION WITH CORONARY ANGIOGRAM;  Surgeon: Jettie Booze, MD; [facility] [place], LAD mild dz, D1 80%, D2 OK, CFX system OK, RCA OK, PDA 100%, med rx  . PERMANENT PACEMAKER INSERTION N/A 07/18/2014   Procedure: PERMANENT  PACEMAKER INSERTION;  Surgeon: Thompson Grayer, MD; Medtronic [place] model [phone] (serial number [id number])    . RADIOLOGY WITH ANESTHESIA N/A 11/16/2017   Procedure: RADIOLOGY WITH ANESTHESIA;  Surgeon: Luanne Bras, MD;  Location: [place];  Service: Radiology;  Laterality: N/A;  . TEMPORARY PACEMAKER INSERTION N/A 07/18/2014   Procedure: TEMPORARY PACEMAKER INSERTION;  Surgeon: Peter M Martinique, MD;  Location: [facility] CATH LAB;  Service: Cardiovascular;  Laterality: N/A;   HPI:  84 yo admitted with left weakness and right gaze preference with right basal ganglia infarct s/p revascularization. 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, atrial fibrillation. MBS 11/19/17 penetration/trace aspiraiton thin and multiple cup sips nectar, recommending Dys 1, nectar by teaspoon only. Pt on inpatient rehab and developed concerns for  GIB and transferred to stepsdown unit on acute venue. EGD 7/29 > melena due to duodenal ulcer and gastritis and clear liquids rec'd by GI however pt on nectar thick liquids currently due to dysphagia. ST ordered to evaluate for thickened consistency. CXR No active cardiopulmonary disease.   Assessment / Plan / Recommendation Clinical Impression  Pt demosntrates stable swallow function consistent with prior documentation on CIR. He was given seroquel jsut prior to this assessment, so he was quite lethargic and not capable of attempting all textures or discussing diet advancement. He is safe however to continue prior diet recommendations with full supervision from family or staff even if somewhat lethargic. Continue puree and nectar thick liquids with a spoon. If function or arousal improves while on acute care will consider further diagnostic testing for diet advancement.  SLP Visit  Diagnosis: Dysphagia, oropharyngeal phase (R13.12);Dysarthria and anarthria (R47.1);Cognitive communication deficit (R41.841) Frontal lobe and executive function deficit following: Cerebral  infarction    Aspiration Risk  Moderate aspiration risk    Diet Recommendation Dysphagia 1 (Puree);Nectar-thick liquid   Liquid Administration via: Spoon Medication Administration: Whole meds with puree Supervision: Full supervision/cueing for compensatory strategies;Staff to assist with self feeding Compensations: Slow rate;Small sips/bites;Minimize environmental distractions Postural Changes: Seated upright at 90 degrees    Other  Recommendations Oral Care Recommendations: Oral care QID   Follow up Recommendations 24 hour supervision/assistance;Other (comment)      Frequency and Duration min 2x/week  2 weeks       Prognosis Prognosis for Safe Diet Advancement: Good      Swallow Study   General HPI: 84 yo admitted with left weakness and right gaze preference with right basal ganglia infarct s/p revascularization. 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, atrial fibrillation. MBS 11/19/17 penetration/trace aspiraiton thin and multiple cup sips nectar, recommending Dys 1, nectar by teaspoon only. Pt on inpatient rehab and developed concerns for  GIB and transferred to stepsdown unit on acute venue. EGD 7/29 > melena due to duodenal ulcer and gastritis and clear liquids rec'd by GI however pt on nectar thick liquids currently due to dysphagia. ST ordered to evaluate for thickened consistency. CXR No active cardiopulmonary disease. Type of Study: Bedside Swallow Evaluation Previous Swallow Assessment: MBS 7/21 Diet Prior to this Study: Dysphagia 1 (puree);Nectar-thick liquids Temperature Spikes Noted: No Respiratory Status: Nasal cannula History of Recent Intubation: No Behavior/Cognition: Alert;Cooperative;Distractible;Requires cueing Oral Cavity Assessment: Within Functional Limits Oral Care Completed by SLP: No Oral Cavity - Dentition: Edentulous Vision: Impaired for self-feeding Self-Feeding Abilities: Total assist Patient Positioning: Upright in  bed Baseline Vocal Quality: Low vocal intensity Volitional Cough: Cognitively unable to elicit Volitional Swallow: Unable to elicit    Oral/Motor/Sensory Function Overall Oral Motor/Sensory Function: Moderate impairment Facial ROM: Reduced left;Suspected CN VII (facial) dysfunction Facial Symmetry: Abnormal symmetry left;Suspected CN VII (facial) dysfunction Facial Strength: Reduced left;Suspected CN VII (facial) dysfunction Facial Sensation: Reduced left;Suspected CN V (Trigeminal) dysfunction Lingual ROM: Reduced left;Suspected CN XII (hypoglossal) dysfunction Lingual Symmetry: Abnormal symmetry left;Suspected CN XII (hypoglossal) dysfunction Lingual Strength: Suspected CN XII (hypoglossal) dysfunction   Ice Chips Ice chips: Not tested   Thin Liquid Thin Liquid: Not tested    Nectar Thick Nectar Thick Liquid: Within functional limits Presentation: Spoon Oral Phase Impairments: Poor awareness of bolus   Honey Thick Honey Thick Liquid: Not tested   Puree Puree: Within functional limits Presentation: Spoon   Solid     Solid: Not tested     Herbie Baltimore, MA CCC-SLP [phone]  Lynann Beaver 11/30/2017,12:04 PM

## 2017-11-30 NOTE — Progress Notes (Addendum)
[facility] PROGRESS NOTE Subjective No gross bleeding.  Objective: Vital signs in last 24 hours: Temp:  [98.1 F (36.7 C)-98.8 F (37.1 C)] 98.1 F (36.7 C) (08/01 1124) Pulse Rate:  [77-131] 87 (08/01 1100) Resp:  [13-25] 13 (08/01 1100) BP: (127-174)/(16-129) 130/71 (08/01 1100) SpO2:  [74 %-100 %] 100 % (08/01 1100) Weight:  [81 kg (178 lb 9.2 oz)] 81 kg (178 lb 9.2 oz) (08/01 0404) Last BM Date: 11/29/17  Intake/Output from previous day: 07/31 0701 - 08/01 0700 In: 3856.6 [P.O.:150; I.V.:3656.6; IV Piggyback:50] Out: 2550 [Urine:2550] Intake/Output this shift: Total I/O In: 310 [I.V.:300; Other:10] Out: 525 [Urine:525]  PE: General--very sedated  Abdomen--nontender Lab Results: Recent Labs    11/29/17 0100 11/29/17 0512 11/29/17 1221 11/29/17 1813 11/30/17 0041 11/30/17 0507  WBC 15.5* 16.3* 15.5* 18.1*  --  17.1*  HGB 9.6* 9.8* 9.1* 9.1* 8.6* 9.1*  HCT 27.8* 28.4* 26.3* 26.7* 25.6* 26.9*  PLT 207 230 209 257  --  256   BMET Recent Labs    11/28/17 0659 11/29/17 0512 11/30/17 0507  NA 136 137 139  K 3.6 3.6 3.4*  CL 105 107 106  CO2 24 23 25   CREATININE 0.93 0.86 0.84   LFT No results for input(s): PROT, AST, ALT, ALKPHOS, BILITOT, BILIDIR, IBILI in the last 72 hours. PT/INR Recent Labs    11/29/17 0512  LABPROT 16.9*  INR 1.38   PANCREAS No results for input(s): LIPASE in the last 72 hours.       Studies/Results: Dg Chest Port 1 View  Result Date: 11/29/2017 CLINICAL DATA:  Respiratory failure EXAM: PORTABLE CHEST 1 VIEW COMPARISON:  11/28/2017 FINDINGS: Left pacer and right central line remain in place, unchanged. Heart is upper limits normal in size. Lungs clear. No effusions or acute bony abnormality. IMPRESSION: No active disease. Electronically Signed   By: Rolm Baptise M.D.   On: 11/29/2017 07:20    Medications: I have reviewed the patient's current medications.  Assessment:   1. Bleeding DU. Pt had rebleed on  anticoagulation. Seems to do OK when anticoagulation on hold.  Plan: Discussed with son. He is currently stable and feel he should remain off of anticoagulation for at least 2-3 weeks if not indefinitely. Will sign off please call for further problems.  Nancy Fetter 11/30/2017, 2:29 PM  This note was created using voice recognition software. Minor errors may Have occurred unintentionally.  Pager: [phone] If no answer or after hours call [phone]

## 2017-11-30 NOTE — Progress Notes (Addendum)
Inpatient Rehabilitation Admissions Coordinator  I met with pt's son at bedside. Patient sleeping. Son confirms he would like patient to be readmitted to [facility] when medically appropriate. I will follow.  Danne Baxter, RN, MSN Rehab Admissions Coordinator [phone] 11/30/2017 2:56 PM

## 2017-11-30 NOTE — Progress Notes (Signed)
eLink Physician-Brief Progress Note Patient Name: Dakota [name] DOB: 21-Oct-1928 MRN: [id number]   Date of Service  11/30/2017  HPI/Events of Note  GI bleeding with hemoglobin being serially checked.  eICU Interventions  Stat H & H now        Frederik Pear 11/30/2017, 12:24 AM

## 2017-12-01 ENCOUNTER — Other Ambulatory Visit: Payer: Self-pay

## 2017-12-01 LAB — BASIC METABOLIC PANEL
ANION GAP: 8 (ref 5–15)
BUN: 7 mg/dL — ABNORMAL LOW (ref 8–23)
CALCIUM: 8.4 mg/dL — AB (ref 8.9–10.3)
CO2: 26 mmol/L (ref 22–32)
Chloride: 104 mmol/L (ref 98–111)
Creatinine, Ser: 0.85 mg/dL (ref 0.61–1.24)
GLUCOSE: 109 mg/dL — AB (ref 70–99)
POTASSIUM: 3.4 mmol/L — AB (ref 3.5–5.1)
Sodium: 138 mmol/L (ref 135–145)

## 2017-12-01 LAB — CBC
HEMATOCRIT: 28.1 % — AB (ref 39.0–52.0)
Hemoglobin: 9.2 g/dL — ABNORMAL LOW (ref 13.0–17.0)
MCH: 29.4 pg (ref 26.0–34.0)
MCHC: 32.7 g/dL (ref 30.0–36.0)
MCV: 89.8 fL (ref 78.0–100.0)
PLATELETS: 279 10*3/uL (ref 150–400)
RBC: 3.13 MIL/uL — AB (ref 4.22–5.81)
RDW: 14.6 % (ref 11.5–15.5)
WBC: 12.2 10*3/uL — ABNORMAL HIGH (ref 4.0–10.5)

## 2017-12-01 LAB — GLUCOSE, CAPILLARY: Glucose-Capillary: 103 mg/dL — ABNORMAL HIGH (ref 70–99)

## 2017-12-01 MED ORDER — CIPROFLOXACIN HCL 500 MG PO TABS
500.0000 mg | ORAL_TABLET | Freq: Two times a day (BID) | ORAL | Status: AC
  Administered 2017-12-01 – 2017-12-04 (×6): 500 mg via ORAL
  Filled 2017-12-01 (×6): qty 1

## 2017-12-01 MED ORDER — PANTOPRAZOLE SODIUM 40 MG PO TBEC
40.0000 mg | DELAYED_RELEASE_TABLET | Freq: Two times a day (BID) | ORAL | Status: DC
  Administered 2017-12-02 – 2017-12-04 (×5): 40 mg via ORAL
  Filled 2017-12-01 (×5): qty 1

## 2017-12-01 MED ORDER — ACETAMINOPHEN 325 MG PO TABS
650.0000 mg | ORAL_TABLET | Freq: Four times a day (QID) | ORAL | Status: DC | PRN
  Administered 2017-12-01: 650 mg via ORAL
  Filled 2017-12-01: qty 2

## 2017-12-01 MED ORDER — HYDROCODONE-ACETAMINOPHEN 5-325 MG PO TABS: 1.0000 | ORAL_TABLET | Freq: Four times a day (QID) | ORAL | Status: DC | PRN

## 2017-12-01 MED ORDER — QUETIAPINE FUMARATE 25 MG PO TABS
12.5000 mg | ORAL_TABLET | Freq: Every day | ORAL | Status: DC
  Administered 2017-12-01 – 2017-12-03 (×3): 12.5 mg via ORAL
  Filled 2017-12-01 (×3): qty 1

## 2017-12-01 MED ORDER — DIVALPROEX SODIUM 125 MG PO CSDR
250.0000 mg | DELAYED_RELEASE_CAPSULE | Freq: Three times a day (TID) | ORAL | Status: DC
  Administered 2017-12-01 – 2017-12-04 (×9): 250 mg via ORAL
  Filled 2017-12-01 (×9): qty 2

## 2017-12-01 NOTE — PMR Pre-admission (Signed)
PMR Admission Coordinator Pre-Admission Assessment  Patient: Dakota [name] is an 84 y.o., male MRN: [id number] DOB: [birth year] Height: 5\' 7"  (170.2 cm) Weight: 78.1 kg (172 lb 2.9 oz)              Insurance Information HMO:     PPO:      PCP:      IPA:      80/20:      OTHER: no HMO PRIMARY: Medicare a and b      Policy#: [id number]      Subscriber: pt Benefits:  Phone #: passport one online     Name: 12/01/2017 Eff. Date: 08/30/1992     Deduct: $1364      Out of Pocket Max: none      Life Max: none CIR: 100%      SNF: 20 full days Outpatient: 80%     Co-Pay: 20% Home Health: 100%      Co-Pay: none DME: 80%     Co-Pay: 20% Providers: pt choice  SECONDARY: [employer]      Policy#: [id number]      Subscriber: pt  Medicaid Application Date:       Case Manager:  Disability Application Date:       Case Worker:   Emergency [name]    Name Relation Home Work Tur[place] Daughter [phone]     Jeffry, Vogelsang [phone]  [phone]   Essam, Lowdermilk   [phone]   Steed, Kanaan   [phone]     Current Medical History  Patient Admitting Diagnosis: right CVA; GI Bleed  History of Present Illness: Dakota [name] is an 84 year old male with history of HTN, CHB s/p PPM, Afib, GERD/Schatzki's ring; who was admitted on 11/16/2017 with left-sided weakness, right gaze preference, slurred speech and mental status changes.  CT of head done showing right basal ganglia infarct with concerns of distal right MCA thrombus.  She underwent cerebral angiogram with revascularization of right MCA M2 segment by Dr. Estanislado Pandy.   Patient continued to have issues with confusion and delirium and repeat CT head on 7/19 showed propagation of acute right MCA territory infarct involving right insula and right temporoparietal lobe and dense right insular MCA consistent with thromboembolism.    Dr. Erlinda Hong felt that stroke was embolic due to A. fib and patient to  start Eliquis on hospital day 5.  Patient  had issues with delirium sleep-wake sleep-wake cycle disruption agitation despite Seroquel.  Depakote was added at discharge.   Patient was cleared to start Eliquis at admission on 11/21/17.  Morning dose Seroquel was DC'd to prevent daytime sedation and sleep-wake cycle has been monitored.  Due to evidence of progressive leukocytosis; chest x-ray and UA/UCS were ordered to rule out infection as cause of delirium.  Chest x-ray was negative for acute changes.  UA showed evidence of UTI therefore Bactrim was added for treatment on 726.  IV fluids were changed to at bedtime and nutritional supplements were ordered to help with p.o. intake.  Patient has had fluctuating mental status with extremely poor p.o. intake therefore calorie count was initiated and concerns about need for alternate source of nutrition was discussed briefly with family.    Follow up labs past admission showed persistent leucocytosis with mild drop in H/H. He has had issues with constipation treated with suppository and enema on p.m. of 7/25.  Frequent stooling was reported and as well as concerns of tarry stools.  Stool guaiac was checked and noted to be positive. Later that afternoon patient reported abdominal lower abdominal pain and was found to have maroon appearing stools.  Stat CBC, Lytes and type and cross were ordered for workup.   He was noted to be hypotensive with SBP in 80's. He was treated with fluid bolus with improvement in SBP to 90-100. He was noted to be lethargic and poorly responsive despite IV fluids. Eliquis was discontinued. Dr. Leandrew Koyanagi ist was consulted for assistance and transfer.   Dr. Oletta Lamas was consulted to evaluated patient for work up of GIB. Patient was in need of closer monitoring and was discharged to a stepdown unit on 11/24/2017  EGD performed 7/29 revealed duodenal ulcer as well as gastritis and esophagitis. Placed on Protonix and re bled again when  anticoagulation restarted. Now placed on hold. Dr. Oletta Lamas, GI. Feels patient should remain off anticoagulation for at least 2 to 3 weeks if not indefinitely.  Total: 8 NIHSS    Past Medical History  Past Medical History:  Diagnosis Date  . Anxiety   . Back pain, chronic   . Complete heart block ([place]) 07/18/2014   Medtronic Tennyson model [id number] (serial number [id number])  singe lead PPM  . Esophageal obstruction due to food impaction 2017  . GERD (gastroesophageal reflux disease) 10/2002  . Hyperlipidemia 12/1994  . Hypertension   . Hypokalemia   . Lung collapse 03/1991   Fall  . Permanent atrial fibrillation ([name])    Refused coumadin therapy    Family History  family history includes Cancer in his brother, brother, and father; Heart disease in his sister and son; Hip fracture in his mother; Pneumonia in his mother; Prostate cancer in his brother.  Prior Rehab/Hospitalizations:  Has the patient had major surgery during 100 days prior to admission? No  Current Medications   Current Facility-Administered Medications:  .  acetaminophen (TYLENOL) tablet 650 mg, 650 mg, Oral, Q6H PRN, Opyd, Ilene Qua, MD, 650 mg at 12/01/17 2100 .  chlorhexidine (PERIDEX) 0.12 % solution 15 mL, 15 mL, Mouth Rinse, BID, Patrecia Pour, MD, 15 mL at 12/04/17 0829 .  Chlorhexidine Gluconate Cloth 2 % PADS 6 each, 6 each, Topical, Daily, Juanito Doom, MD, 6 each at 12/03/17 0809 .  divalproex (DEPAKOTE SPRINKLE) capsule 250 mg, 250 mg, Oral, Q8H, Cherene Altes, MD, 250 mg at 12/04/17 1323 .  HYDROcodone-acetaminophen (NORCO/VICODIN) 5-325 MG per tablet 1-2 tablet, 1-2 tablet, Oral, Q6H PRN, Opyd, Timothy S, MD .  MEDLINE mouth rinse, 15 mL, Mouth Rinse, q12n4p, Vance Gather B, MD, 15 mL at 12/04/17 1213 .  nicotine (NICODERM CQ - dosed in mg/24 hr) patch 7 mg, 7 mg, Transdermal, Daily, Simonne Maffucci B, MD, 7 mg at 12/04/17 0839 .  pantoprazole (PROTONIX) EC tablet 40 mg, 40 mg, Oral, BID AC,  Cherene Altes, MD, 40 mg at 12/04/17 0829 .  QUEtiapine (SEROQUEL) tablet 12.5 mg, 12.5 mg, Oral, QHS, Cherene Altes, MD, 12.5 mg at 12/03/17 2040 .  sodium chloride flush (NS) 0.9 % injection 10-40 mL, 10-40 mL, Intracatheter, Q12H, McQuaid, Douglas B, MD, 10 mL at 12/03/17 2041 .  sodium chloride flush (NS) 0.9 % injection 10-40 mL, 10-40 mL, Intracatheter, PRN, McQuaid, Douglas B, MD .  sodium chloride flush (NS) 0.9 % injection 3 mL, 3 mL, Intravenous, Q12H, Patrecia Pour, MD, 3 mL at 12/04/17 0840  Patients Current Diet:  Diet Order  DIET - DYS 1 Room service appropriate? 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 No confusion pta; typically gets confused when hospitalized  Home Assistive Devices / [place] Devices/Equipment: Environmental consultant (specify type) Home Equipment: Walker - 2 wheels, Transport chair, Bedside commode  Prior Device Use: Indicate devices/aids used by the patient prior to current illness, exacerbation or injury? None of the above  Prior Functional Level Prior Function Level of Independence: Independent Gait / Transfers Assistance Needed: independent without AD ADL's / Homemaking Assistance Needed: INdpendent without AD Communication / Swallowing Assistance Needed: intact Comments: very independent, 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 Pt has 4 kids who can assist  Self Care: Did the patient need help bathing, dressing, using the toilet or eating?  Independent  Indoor Mobility: Did the patient need assistance with walking from room to room  (with or without device)? Independent  Stairs: Did the patient need assistance with internal or external stairs (with or without device)? Independent  Functional Cognition: Did the patient need help planning regular tasks such as shopping or remembering to take medications? Independent  Current Functional Level Cognition  Overall Cognitive Status: Impaired/Different from baseline Current Attention Level: Sustained Orientation Level: Oriented to person, Oriented to place, Disoriented to time, Disoriented to situation Following Commands: Follows one step commands with increased time Safety/Judgement: Decreased awareness of safety, Decreased awareness of deficits General Comments: When asked who was in the room with pt, pt stated "my dad", in reference to his son.    Extremity Assessment (includes Sensation/Coordination)  Upper Extremity Assessment: LUE deficits/detail LUE Deficits / Details: worked on wide range reaching with L UE in supported sitting LUE Sensation: (impaired) LUE Coordination: decreased fine motor, decreased gross motor  Lower Extremity Assessment: LLE deficits/detail RLE Deficits / Details: generalized weakness LLE Deficits / Details: spontaneous movement of L LE, difficulty with purposeful movement LLE Sensation: (impaired ) LLE Coordination: decreased fine motor, decreased gross motor(poor proprioception )    ADLs  Overall ADL's : Needs assistance/impaired Eating/Feeding: Maximal assistance, Sitting Eating/Feeding Details (indicate cue type and reason): requires sips with spoon Grooming: Brushing hair, Standing, Minimal assistance Grooming Details (indicate cue type and reason): stood with R UE stabilizing on sink and min assist for standing balance Upper Body Bathing: Maximal assistance Lower Body Bathing: Total assistance Upper Body Dressing : Maximal assistance Lower Body Dressing: Total assistance, Bed level Lower Body Dressing Details (indicate cue type  and reason): sock Toilet Transfer: +2 for physical assistance, Maximal assistance Toileting- Clothing Manipulation and Hygiene: Total assistance, Sit to/from stand, +2 for physical assistance Toileting - Clothing Manipulation Details (indicate cue type and reason): incontinenet of apparent large melanic stool; nsg aware Functional mobility during ADLs: Maximal assistance, +2 for physical assistance General ADL Comments: Worked on sitting balance, activation of trunk through reaching activities at EOB, pt needing min to mod assist with balance due to L side leaning    Mobility  Overal bed mobility: Needs Assistance Bed Mobility: Supine to Sit Rolling: Min guard Sidelying to sit: +2 for physical assistance, Min assist, HOB elevated Supine to sit: Total assist, +2 for  physical assistance General bed mobility comments: light min A with cues for technique. Increased time and use of bed rails with HOB elevated    Transfers  Overall transfer level: Needs assistance Equipment used: 2 person hand held assist Transfer via [place]: Stedy Transfers: Sit to/from Stand, Risk manager Sit to Stand: +2 physical assistance, Mod assist, Min assist Stand pivot transfers: +2 physical assistance, Mod assist General transfer comment: Assist required to rise and steady. Mod A at times due to L Lateral lean. Able to perform sit<>stand 4x for peri care and ADLs.    Ambulation / Gait / Stairs / Wheelchair Mobility  Ambulation/Gait General Gait Details: unable    Posture / Balance Dynamic Sitting Balance Sitting balance - Comments: assist ranging from min to mod assist during dynamic activities, pt reports he is aware he is leaning L, but needs cues to correct Balance Overall balance assessment: Needs assistance Sitting-balance support: Single extremity supported, Feet supported Sitting balance-Leahy Scale: Poor Sitting balance - Comments: assist ranging from min to mod assist during dynamic  activities, pt reports he is aware he is leaning L, but needs cues to correct Postural control: Left lateral lean Standing balance support: Bilateral upper extremity supported Standing balance-Leahy Scale: Poor Standing balance comment: +2 moderate assistance with hands held, min assist and one hand support at sink    Special needs/care consideration BiPAP/CPAP n/a CPM  N/a Continuous Drip IV: NA Dialysis  N/a Life Vest  N/a Oxygen: NA on RA  Special Bed: NA Trach Size  N/a Wound Vac n/a Skin: Abrasion to back, leg, arm                               Location (Right; bilateral)  **also noted is skin tear on Left medial back.  Bowel mgmt:12/02/17; continent Bladder mgmt:external catheter in place Diabetic mgmt yes Patient with some delirium throughout hospitalization. Pt was not confused pta. Sons typically at bedside during the day, and need a lot of education on managing patient;s environment to decrease his confusion    Previous Home Environment Living Arrangements: Spouse/significant other  Lives With: Spouse Available Help at Discharge: Family, Available 24 hours/day Type of Home: House Home Layout: One level Home Access: Ramped entrance Bathroom Shower/Tub: Gaffer, Chiropodist: Standard Bathroom Accessibility: Yes How Accessible: Accessible via walker [place]: No Additional Comments: pt was primary caregiver for wife who does not walk, pt would transfer her and family assists with bathing  Discharge Living Setting Plans for Discharge Living Setting: Patient's home, Lives with (comment) Type of Home at Discharge: House Discharge Home Layout: One level Discharge Home Access: [place] entrance Discharge Bathroom Shower/Tub: Walk-in shower Discharge Bathroom Toilet: Standard Discharge [employer] Accessibility: Yes How Accessible: Accessible via walker Does the patient have any problems obtaining your medications?:  No  Social/Family/Support Systems Patient Roles: Spouse, Caregiver, Parent Contact Information: Abe People and Aydin per [employer] Anticipated Caregiver: Family currently sharing responsibilities for care of their mother and plan to continue this for pt, too, upon d/c. Anticipated Caregiver's Contact Information: see above Ability/Limitations of Caregiver: Hannah retired;  other 3 children are working full-time and one with new job and less daytime availability.   Caregiver Availability: 24/7 Discharge Plan Discussed with Primary Caregiver: Yes Is Caregiver In Agreement with Plan?: Yes Does Caregiver/Family have Issues with Lodging/Transportation while Pt is in Rehab?: No   Goals/Additional Needs Patient/Family Goal for Rehab: supervision to  min assist with PT, OT, and SLP Expected length of stay: 21-24 days Special Service Needs: patietn alert and orietned pta; no confusion Pt/Family Agrees to Admission and willing to participate: Yes Program Orientation Provided & Reviewed with Pt/Caregiver Including Roles  & Responsibilities: Yes  Decrease burden of Care through IP rehab admission: n/a  Possible need for SNF placement upon discharge:Patient son, Orlo, states family would take patient home no matter what. adamantly refuses SNF  Patient Condition: The patient's medical and functional status has changed since his Rehab consult on 11/17/17 and subsequent readmission to acute hospital on 11/24/2017. See history of present illness for medical update. Functional changes are patient is overall mod to max assist with mobility and adls. Patient's medical and functional status has been discussed with the Rehabilitation physician and patient remains appropriate for inpatient rehabilitation admission. We will admit patient to inpatient rehab today (12/04/17).  Preadmission Screen Completed By:  Danne Baxter, Admissions Coordinator on 12/01/2017 , 12/04/2017 1:41 PM with updates by Jhonnie Garner, OTR/L,  Admissions Coordinator  ______________________________________________________________________   Discussed status with Dr. Posey Pronto on 12/04/17 at 1:40 PM and received telephone approval for admission today.  Admission Coordinator:  Jhonnie Garner, time 1:40PM Sudie Grumbling 12/04/17.

## 2017-12-01 NOTE — Progress Notes (Signed)
Physical Therapy Treatment Patient Details Name: Dakota [name] MRN: [id number] DOB: [birth year] Today's Date: 12/01/2017    History of Present Illness 84 yo admitted with left weakness and right gaze preference with right basal ganglia infarct s/p revascularization. Admitted to CIR for rehab 7/23. Admitted to Acute 7/26 due to apparent GIB and transferred to ICU. 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, atrial fibrillation    PT Comments    Pt demonstrating some progress since prior PT visit. Session focused on standing balance utilizing Stedy. Patient able to rise with moderate assistance and tolerate standing for 5-7 minutes performing UE reaching exercises with minimal sway. R sided lean still noted in sitting. HRmax 110 with A fib. Pt in chair with lift pad for nursing to safely return to bed later. No blood or stool noted this session.   Follow Up Recommendations  CIR;Supervision/Assistance - 24 hour     Equipment Recommendations  Other (comment)(TBD)    Recommendations for Other Services       Precautions / Restrictions Precautions Precautions: Fall Precaution Comments: right gaze, left hemiparesis, left lean /pushing Restrictions Weight Bearing Restrictions: No    Mobility  Bed Mobility Overal bed mobility: Needs Assistance Bed Mobility: Supine to Sit Rolling: Max assist         General bed mobility comments: Max A to power trunk up, pt able to get legs towards EOB but not fully over  Transfers Overall transfer level: Needs assistance Equipment used: Ambulation equipment used Transfers: Sit to/from Stand Sit to Stand: Mod assist;+2 physical assistance Stand pivot transfers: Mod assist;+2 physical assistance       General transfer comment: Mod A x2 to stand inside [place] today, pt tolerated standing with BUE support for 5 minutes. single UE support at times while performing reaching exercises.   Ambulation/Gait             General  Gait Details: unable   Stairs             Wheelchair Mobility    Modified Rankin (Stroke Patients Only)       Balance Overall balance assessment: Needs assistance Sitting-balance support: Single extremity supported;Feet supported Sitting balance-Leahy Scale: Poor Sitting balance - Comments: assist ranging from max to min guard; prefers L UE to be behind back with with max tactile cueing to use UE for support; R UE reaching for end of bed for balance and scoots hips towards EOB/end of bed Postural control: Posterior lean;Left lateral lean Standing balance support: Bilateral upper extremity supported Standing balance-Leahy Scale: Zero Standing balance comment: B UE support in standing as well as knee blocking with max cueing for improved forward gaze and hip/knee extension                            Cognition Arousal/Alertness: Awake/alert Behavior During Therapy: Restless;Flat affect Overall Cognitive Status: Impaired/Different from baseline Area of Impairment: Orientation;Attention;Memory;Following commands;Safety/judgement;Problem solving;Awareness                 Orientation Level: Disoriented to;Place;Time;Situation Current Attention Level: Focused Memory: Decreased short-term memory Following Commands: Follows one step commands inconsistently Safety/Judgement: Decreased awareness of safety;Decreased awareness of deficits Awareness: Intellectual Problem Solving: Slow processing;Decreased initiation;Difficulty sequencing;Requires verbal cues;Requires tactile cues        Exercises      General Comments        Pertinent Vitals/Pain Pain Assessment: No/denies pain Pain Location: general discomfort    Home  Living                 Additional Comments: pt was primary caregiver for wife who does not walk, pt would transfer her and family assists with bathing    Prior Function Level of Independence: Independent  Gait / Transfers  Assistance Needed: independent without AD ADL's / Homemaking Assistance Needed: INdpendent without AD Comments: very independent, 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 Pt has 4 kids who can assist   PT Goals (current goals can now be found in the care plan section) Acute Rehab PT Goals Patient Stated Goal: go home and be with wife PT Goal Formulation: With family Time For Goal Achievement: 12/13/17 Potential to Achieve Goals: Fair Progress towards PT goals: Progressing toward goals    Frequency    Min 3X/week      PT Plan Current plan remains appropriate    Co-evaluation              AM-PAC PT "6 Clicks" Daily Activity  Outcome Measure  Difficulty turning over in bed (including adjusting bedclothes, sheets and blankets)?: Unable Difficulty moving from lying on back to sitting on the side of the bed? : Unable Difficulty sitting down on and standing up from a chair with arms (e.g., wheelchair, bedside commode, etc,.)?: Unable Help needed moving to and from a bed to chair (including a wheelchair)?: Total Help needed walking in hospital room?: Total Help needed climbing 3-5 steps with a railing? : Total 6 Click Score: 6    End of Session Equipment Utilized During Treatment: Gait belt Activity Tolerance: Patient tolerated treatment well Patient left: in chair;with call bell/phone within reach;with bed alarm set;with family/visitor present Nurse Communication: Mobility status PT Visit Diagnosis: Other abnormalities of gait and mobility (R26.89);Other symptoms and signs involving the nervous system (R29.898);Hemiplegia and hemiparesis;Apraxia (R48.2);Muscle weakness (generalized) (M62.81) Hemiplegia - Right/Left: Left Hemiplegia - dominant/non-dominant: Non-dominant Hemiplegia - caused by: Cerebral infarction     Time: 7169-6789 PT Time Calculation (min) (ACUTE ONLY): 25 min  Charges:  $Therapeutic Activity: 8-22 mins                     Reinaldo Berber, PT,  DPT Acute Rehab Services Pager: [phone]     Reinaldo Berber 12/01/2017, 4:53 PM

## 2017-12-01 NOTE — Progress Notes (Signed)
[place] TEAM 1 - Stepdown/ICU TEAM  Dakota [name]  QMG:[id number] DOB: 1928/05/31 DOA: 11/24/2017 PCP: Tonia Ghent, MD    Brief Narrative:  84 y/o male who was admitted on 7/19 with an acute R MCA stroke requiring revascularization in interventional radiology, then he was transferred to a rehab facility where he developed a GI bleed.  He was sent back to [facility] for further evaluation.  Anticoagulation for secondary stroke prevention (afib) was held on admission but re-introduced again.  He developed a GI bleed again and was moved to the ICU.  Significant Events: 7/26 admit w/ GIB 7/29 EGD > large duodenal ulcer, path negative 7/30 recurrent GIB  Subjective: The patient has been somewhat less confused today but remains quite somnolent.  His son reports that he usually develops significant delirium when he is admitted to the hospital.  The patient appears comfortable.  There is no evidence of respiratory distress shortness of breath or uncontrolled pain.  Assessment & Plan:  Acute GI bleeding > Duodenal ulcer  Do not re-introduce anticoagulation - PPI IV drip to transition to IV q12 today, then to oral when intake reliable   Hemorrhagic shock - Acute blood loss anemia  Hgb now stable   Recent Labs  Lab 11/29/17 1221 11/29/17 1813 11/30/17 0041 11/30/17 0507 12/01/17 0516  HGB 9.1* 9.1* 8.6* 9.1* 9.2*    Esophageal stricture - Dysphagia Dysphagia 1 diet w/ nectar thick liquids as per SLP suggestion   Chronic Afib Now off anticoag due to recurrent GIB  CHB s/p pacer   GERD On PPI  UTI Culture from 7/24 noted >100K Enterobacter cloacae - pt completed 6 days of abx from 7/25 > 7/30 but first 2 days were w/ macrobid to which the organism is only intermed sensitive - will resume abx and extend course another 3 days to assure this is not contributing to his delirium     Recent R MCA stroke ongoing PT/SLP/OT - to return to CIR   Severe acute delirium  overnight Has a long hx of sundowning per son - utilize LOW DOSE seroquel at [place] and follow   DVT prophylaxis: SCDs Code Status: FULL CODE Family Communication: spoke with son at bedside Disposition Plan: medically stable for CIR if they are able to accommodate his acute delirium  Consultants:  PCCM GI  Antimicrobials:  Cipro 7/27 > 7/30 + 8/2 >  Objective: Blood pressure 134/62, pulse 85, temperature 98.2 F (36.8 C), temperature source Oral, resp. rate 17, height 5\' 7"  (1.702 m), weight 81.3 kg (179 lb 3.7 oz), SpO2 99 %.  Intake/Output Summary (Last 24 hours) at 12/01/2017 1512 Last data filed at 12/01/2017 0930 Gross per 24 hour  Intake 273.8 ml  Output 1000 ml  Net -726.2 ml   Filed Weights   11/29/17 0500 11/30/17 0404 12/01/17 0500  Weight: 81.9 kg (180 lb 8.9 oz) 81 kg (178 lb 9.2 oz) 81.3 kg (179 lb 3.7 oz)    Examination: General: No acute respiratory distress Lungs: Clear to auscultation bilaterally without wheezes or crackles Cardiovascular: Regular rate and rhythm without murmur gallop or rub normal S1 and S2 Abdomen: Nontender, nondistended, soft, bowel sounds positive, no rebound, no ascites, no appreciable mass Extremities: No significant cyanosis, clubbing, or edema bilateral lower extremities  CBC: Recent Labs  Lab 11/29/17 1813 11/30/17 0041 11/30/17 0507 12/01/17 0516  WBC 18.1*  --  17.1* 12.2*  NEUTROABS  --   --  11.5*  --  HGB 9.1* 8.6* 9.1* 9.2*  HCT 26.7* 25.6* 26.9* 28.1*  MCV 86.7  --  87.9 89.8  PLT 257  --  256 850   Basic Metabolic Panel: Recent Labs  Lab 11/29/17 0512 11/30/17 0507 12/01/17 0516  NA 137 139 138  K 3.6 3.4* 3.4*  CL 107 106 104  CO2 23 25 26   GLUCOSE 111* 116* 109*  BUN 14 7* 7*  CREATININE 0.86 0.84 0.85  CALCIUM 8.0* 8.3* 8.4*  MG 1.7  --   --   PHOS 2.2*  --   --    GFR: Estimated Creatinine Clearance: 63.7 mL/min (by C-G formula based on SCr of 0.85 mg/dL).  Liver Function Tests: No  results for input(s): AST, ALT, ALKPHOS, BILITOT, PROT, ALBUMIN in the last 168 hours. No results for input(s): LIPASE, AMYLASE in the last 168 hours. No results for input(s): AMMONIA in the last 168 hours.  Coagulation Profile: Recent Labs  Lab 11/29/17 0512  INR 1.38      Recent Results (from the past 240 hour(s))  Culture, Urine     Status: Abnormal   Collection Time: 11/22/17  3:10 AM  Result Value Ref Range Status   Specimen Description URINE, CLEAN CATCH  Final   Special Requests   Final    NONE Performed at [facility], [place] [address] [place], [place] [address]    Culture >=100,000 COLONIES/mL ENTEROBACTER CLOACAE (A)  Final   Report Status 11/24/2017 FINAL  Final   Organism ID, Bacteria ENTEROBACTER CLOACAE (A)  Final      Susceptibility   Enterobacter cloacae - MIC*    CEFAZOLIN >=64 RESISTANT Resistant     CEFTRIAXONE <=1 SENSITIVE Sensitive     CIPROFLOXACIN <=0.25 SENSITIVE Sensitive     GENTAMICIN <=1 SENSITIVE Sensitive     IMIPENEM <=0.25 SENSITIVE Sensitive     NITROFURANTOIN 64 INTERMEDIATE Intermediate     TRIMETH/SULFA <=20 SENSITIVE Sensitive     PIP/TAZO <=4 SENSITIVE Sensitive     * >=100,000 COLONIES/mL ENTEROBACTER CLOACAE     Scheduled Meds: . chlorhexidine  15 mL Mouth Rinse BID  . Chlorhexidine Gluconate Cloth  6 each Topical Daily  . mouth rinse  15 mL Mouth Rinse q12n4p  . nicotine  7 mg Transdermal Daily  . pantoprazole  40 mg Intravenous Q12H  . sodium chloride flush  10-40 mL Intracatheter Q12H  . sodium chloride flush  3 mL Intravenous Q12H     LOS: 7 days   Cherene Altes, MD [facility] Office  [phone] Pager - Text Page per Shea Evans  If 7PM-7AM, please contact night-coverage per Amion 12/01/2017, 3:12 PM

## 2017-12-02 DIAGNOSIS — I10 Essential (primary) hypertension: Secondary | ICD-10-CM

## 2017-12-02 LAB — BASIC METABOLIC PANEL
Anion gap: 4 — ABNORMAL LOW (ref 5–15)
BUN: 14 mg/dL (ref 8–23)
CALCIUM: 8.3 mg/dL — AB (ref 8.9–10.3)
CO2: 27 mmol/L (ref 22–32)
CREATININE: 0.89 mg/dL (ref 0.61–1.24)
Chloride: 108 mmol/L (ref 98–111)
GFR calc Af Amer: >60 mL/min (ref >60)
GLUCOSE: 121 mg/dL — AB (ref 70–99)
Potassium: 3.5 mmol/L (ref 3.5–5.1)
SODIUM: 139 mmol/L (ref 135–145)

## 2017-12-02 LAB — CBC
HEMATOCRIT: 27.5 % — AB (ref 39.0–52.0)
Hemoglobin: 9.1 g/dL — ABNORMAL LOW (ref 13.0–17.0)
MCH: 29.7 pg (ref 26.0–34.0)
MCHC: 33.1 g/dL (ref 30.0–36.0)
MCV: 89.9 fL (ref 78.0–100.0)
PLATELETS: 273 10*3/uL (ref 150–400)
RBC: 3.06 MIL/uL — ABNORMAL LOW (ref 4.22–5.81)
RDW: 14.9 % (ref 11.5–15.5)
WBC: 13.4 10*3/uL — AB (ref 4.0–10.5)

## 2017-12-02 LAB — GLUCOSE, CAPILLARY: Glucose-Capillary: 120 mg/dL — ABNORMAL HIGH (ref 70–99)

## 2017-12-02 NOTE — Progress Notes (Signed)
PROGRESS NOTE    Dakota [name]  HCW:[id number] DOB: [birth year] DOA: 11/24/2017 PCP: Tonia Ghent, MD    Brief Narrative:  84 y/o male who was admitted on 7/19 with an acute R MCA stroke requiring revascularization in interventional radiology, then he was transferred to a rehab facility where he developed a GI bleed. He was sent back to [facility] for further evaluation. Anticoagulation for secondary stroke prevention (afib) was held on admission but re-introduced again. He developed a GI bleed again and was moved to the ICU.  Assessment & Plan:   Principal Problem:   Hypovolemic shock ([place]) Active Problems:   HLD (hyperlipidemia)   Essential hypertension, benign   CAD, NATIVE VESSEL   Atrial fibrillation ([name])   CHB (complete heart block) - s/p MDT 1 lead PPM   Cerebrovascular accident (CVA) due to thrombosis of right middle cerebral artery ([name])   GIB (gastrointestinal bleeding)  Acute blood loss anemia secondary to Duodenal ulcer  -Recommendation to not re-introduce anticoagulation  -Will continue PO PPI as tolerated  Hemorrhagic shock - secondary to acute blood loss anemia  -labs reviewed, hgb had been stable -Repeat cbc in AM  Esophageal stricture - Dysphagia -Recommendation for Dysphagia 1 diet w/ nectar thick liquids as per SLP suggestion -Presently stable   Chronic Afib -Now off anticoag due to recurrent GIB -Patient remains rate controlled at present  CHB s/p pacer  -Stable at present  GERD -Patient to continue PPI as per above  UTI -Urine culture from 7/24 noted >100K Enterobacter cloacae - pt completed 6 days of abx from 7/25 > 7/30 but first 2 days were w/ macrobid to which the organism is only intermed sensitive  -Patient to continue ciprofloxacin through 8/5   Recent R MCA stroke -continue with ongoing PT/SLP/OT -CIR consulted, pending  Severe acute delirium overnight -Patient noted to have a long hx of sundowning per son    -Recommendation to utilize LOW DOSE seroquel at [place] and follow   DVT prophylaxis: SCD's Code Status: Full Family Communication: Pt in room, uncertain at this time Disposition Plan: Uncertain at this time  Consultants:   CIR  GI  Procedures:  7/26 admit w/ GIB 7/29 EGD >large duodenal ulcer, path negative 7/30 recurrent GIB  Antimicrobials: Anti-infectives (From admission, onward)   Start     Dose/Rate Route Frequency Ordered Stop   12/01/17 2000  ciprofloxacin (CIPRO) tablet 500 mg     500 mg Oral 2 times daily 12/01/17 1759 12/04/17 1959   11/28/17 1145  sulfamethoxazole-trimethoprim (BACTRIM DS,SEPTRA DS) 800-160 MG per tablet 1 tablet  Status:  Discontinued     1 tablet Oral Every 12 hours 11/28/17 1143 11/28/17 1221   11/25/17 1730  ciprofloxacin (CIPRO) IVPB 400 mg  Status:  Discontinued     400 mg 200 mL/hr over 60 Minutes Intravenous Every 12 hours 11/25/17 1627 11/29/17 0829       Subjective: Asleep, unable to assess  Objective: Vitals:   12/01/17 2235 12/02/17 0528 12/02/17 0700 12/02/17 1422  BP: 124/75 (!) 146/78  101/65  Pulse: 81 79  71  Resp: 16 18  18   Temp: 98.1 F (36.7 C) 97.9 F (36.6 C)  98 F (36.7 C)  TempSrc: Oral Oral  Oral  SpO2: 97% 98%  97%  Weight:   81.3 kg (179 lb 3.7 oz)   Height:        Intake/Output Summary (Last 24 hours) at 12/02/2017 1534 Last data filed at 12/02/2017 1347  Gross per 24 hour  Intake 13 ml  Output 1200 ml  Net -1187 ml   Filed Weights   11/30/17 0404 12/01/17 0500 12/02/17 0700  Weight: 81 kg (178 lb 9.2 oz) 81.3 kg (179 lb 3.7 oz) 81.3 kg (179 lb 3.7 oz)    Examination:  General exam: Appears calm and comfortable  Respiratory system: Clear to auscultation. Respiratory effort normal. Cardiovascular system: S1 & S2 heard, RRR Gastrointestinal system: Abdomen is nondistended, soft and nontender. No organomegaly or masses felt. Normal bowel sounds heard. Central nervous system: Alert and  oriented. No focal neurological deficits. Extremities: Symmetric 5 x 5 power. Skin: No rashes, lesions Psychiatry:Unable to assess given level of consciousness this AM.   Data Reviewed: I have personally reviewed following labs and imaging studies  CBC: Recent Labs  Lab 11/29/17 1221 11/29/17 1813 11/30/17 0041 11/30/17 0507 12/01/17 0516 12/02/17 0428  WBC 15.5* 18.1*  --  17.1* 12.2* 13.4*  NEUTROABS  --   --   --  11.5*  --   --   HGB 9.1* 9.1* 8.6* 9.1* 9.2* 9.1*  HCT 26.3* 26.7* 25.6* 26.9* 28.1* 27.5*  MCV 85.9 86.7  --  87.9 89.8 89.9  PLT 209 257  --  256 279 732   Basic Metabolic Panel: Recent Labs  Lab 11/28/17 0659 11/29/17 0512 11/30/17 0507 12/01/17 0516 12/02/17 0428  NA 136 137 139 138 139  K 3.6 3.6 3.4* 3.4* 3.5  CL 105 107 106 104 108  CO2 24 23 25 26 27   GLUCOSE 151* 111* 116* 109* 121*  BUN 15 14 7* 7* 14  CREATININE 0.93 0.86 0.84 0.85 0.89  CALCIUM 7.8* 8.0* 8.3* 8.4* 8.3*  MG  --  1.7  --   --   --   PHOS  --  2.2*  --   --   --    GFR: Estimated Creatinine Clearance: 60.8 mL/min (by C-G formula based on SCr of 0.89 mg/dL). Liver Function Tests: No results for input(s): AST, ALT, ALKPHOS, BILITOT, PROT, ALBUMIN in the last 168 hours. No results for input(s): LIPASE, AMYLASE in the last 168 hours. No results for input(s): AMMONIA in the last 168 hours. Coagulation Profile: Recent Labs  Lab 11/29/17 0512  INR 1.38   Cardiac Enzymes: No results for input(s): CKTOTAL, CKMB, CKMBINDEX, TROPONINI in the last 168 hours. BNP (last 3 results) No results for input(s): PROBNP in the last 8760 hours. HbA1C: No results for input(s): HGBA1C in the last 72 hours. CBG: Recent Labs  Lab 11/28/17 2351 11/29/17 0733 11/30/17 0729 12/01/17 0801 12/02/17 0722  GLUCAP 100* 116* 112* 103* 120*   Lipid Profile: No results for input(s): CHOL, HDL, LDLCALC, TRIG, CHOLHDL, LDLDIRECT in the last 72 hours. Thyroid Function Tests: No results for  input(s): TSH, T4TOTAL, FREET4, T3FREE, THYROIDAB in the last 72 hours. Anemia Panel: No results for input(s): VITAMINB12, FOLATE, FERRITIN, TIBC, IRON, RETICCTPCT in the last 72 hours. Sepsis Labs: No results for input(s): PROCALCITON, LATICACIDVEN in the last 168 hours.  No results found for this or any previous visit (from the past 240 hour(s)).   Radiology Studies: No results found.  Scheduled Meds: . chlorhexidine  15 mL Mouth Rinse BID  . Chlorhexidine Gluconate Cloth  6 each Topical Daily  . ciprofloxacin  500 mg Oral BID  . divalproex  250 mg Oral Q8H  . mouth rinse  15 mL Mouth Rinse q12n4p  . nicotine  7 mg Transdermal Daily  . pantoprazole  40 mg Oral BID AC  . QUEtiapine  12.5 mg Oral QHS  . sodium chloride flush  10-40 mL Intracatheter Q12H  . sodium chloride flush  3 mL Intravenous Q12H   Continuous Infusions:   LOS: 8 days   [name], MD [facility] Pager [phone]  If 7PM-7AM, please contact night-coverage [email or web address] Password [facility] 12/02/2017, 3:34 PM

## 2017-12-03 LAB — CBC
HCT: 28.1 % — ABNORMAL LOW (ref 39.0–52.0)
Hemoglobin: 9.2 g/dL — ABNORMAL LOW (ref 13.0–17.0)
MCH: 30 pg (ref 26.0–34.0)
MCHC: 32.7 g/dL (ref 30.0–36.0)
MCV: 91.5 fL (ref 78.0–100.0)
PLATELETS: 307 10*3/uL (ref 150–400)
RBC: 3.07 MIL/uL — AB (ref 4.22–5.81)
RDW: 14.9 % (ref 11.5–15.5)
WBC: 11.7 10*3/uL — ABNORMAL HIGH (ref 4.0–10.5)

## 2017-12-03 LAB — GLUCOSE, CAPILLARY: GLUCOSE-CAPILLARY: 112 mg/dL — AB (ref 70–99)

## 2017-12-03 NOTE — Progress Notes (Signed)
PROGRESS NOTE    CHANCEY RINGEL  UJW:[id number] DOB: 12-06-1928 DOA: 11/24/2017 PCP: Tonia Ghent, MD    Brief Narrative:  84 y/o male who was admitted on 7/19 with an acute R MCA stroke requiring revascularization in interventional radiology, then he was transferred to a rehab facility where he developed a GI bleed. He was sent back to [facility] for further evaluation. Anticoagulation for secondary stroke prevention (afib) was held on admission but re-introduced again. He developed a GI bleed again and was moved to the ICU.  Assessment & Plan:   Principal Problem:   Hypovolemic shock ([place]) Active Problems:   HLD (hyperlipidemia)   Essential hypertension, benign   CAD, NATIVE VESSEL   Atrial fibrillation ([name])   CHB (complete heart block) - s/p MDT 1 lead PPM   Cerebrovascular accident (CVA) due to thrombosis of right middle cerebral artery ([name])   GIB (gastrointestinal bleeding)  Acute blood loss anemia secondary to Duodenal ulcer  -Recommendation to not re-introduce anticoagulation  -Will continue PO PPI as tolerated  Hemorrhagic shock - secondary to acute blood loss anemia  -labs reviewed, hgb had been stable -Recheck cbc in AM  Esophageal stricture - Dysphagia -Recommendation for Dysphagia 1 diet w/ nectar thick liquids as per SLP suggestion -Presently stable   Chronic Afib -Now off anticoag due to recurrent GIB -Patient remains rate controlled at present  CHB s/p pacer  -Stable at present  GERD -Patient to continue PPI as per above  UTI -Urine culture from 7/24 noted >100K Enterobacter cloacae - pt completed 6 days of abx from 7/25 > 7/30 but first 2 days were w/ macrobid to which the organism is only intermed sensitive  -Patient to continue ciprofloxacin through 8/5   Recent R MCA stroke -continue with ongoing PT/SLP/OT -CIR has been consulted. Follow up on recs  Severe acute delirium overnight -Patient noted to have a long hx of sundowning  per son  -Recommendation to utilize LOW DOSE seroquel at [place] and follow   DVT prophylaxis: SCD's Code Status: Full Family Communication: Pt in room, uncertain at this time Disposition Plan: Uncertain at this time  Consultants:   CIR  GI  Procedures:  7/26 admit w/ GIB 7/29 EGD >large duodenal ulcer, path negative 7/30 recurrent GIB  Antimicrobials: Anti-infectives (From admission, onward)   Start     Dose/Rate Route Frequency Ordered Stop   12/01/17 2000  ciprofloxacin (CIPRO) tablet 500 mg     500 mg Oral 2 times daily 12/01/17 1759 12/04/17 1959   11/28/17 1145  sulfamethoxazole-trimethoprim (BACTRIM DS,SEPTRA DS) 800-160 MG per tablet 1 tablet  Status:  Discontinued     1 tablet Oral Every 12 hours 11/28/17 1143 11/28/17 1221   11/25/17 1730  ciprofloxacin (CIPRO) IVPB 400 mg  Status:  Discontinued     400 mg 200 mL/hr over 60 Minutes Intravenous Every 12 hours 11/25/17 1627 11/29/17 0829      Subjective: No complaints this AM  Objective: Vitals:   12/02/17 1422 12/02/17 2021 12/03/17 0510 12/03/17 1452  BP: 101/65 114/81 132/76 107/63  Pulse: 71 82 91 91  Resp: 18 18 18 16   Temp: 98 F (36.7 C) 98.2 F (36.8 C) 98.6 F (37 C) 98.1 F (36.7 C)  TempSrc: Oral Oral Oral Oral  SpO2: 97% 96% 97% 97%  Weight:   79.6 kg (175 lb 7.8 oz)   Height:        Intake/Output Summary (Last 24 hours) at 12/03/2017 1705 Last  data filed at 12/03/2017 1457 Gross per 24 hour  Intake -  Output 2400 ml  Net -2400 ml   Filed Weights   12/01/17 0500 12/02/17 0700 12/03/17 0510  Weight: 81.3 kg (179 lb 3.7 oz) 81.3 kg (179 lb 3.7 oz) 79.6 kg (175 lb 7.8 oz)    Examination: General exam: Conversant, in no acute distress Respiratory system: normal chest rise, clear, no audible wheezing Cardiovascular system: regular rhythm, s1-s2   Data Reviewed: I have personally reviewed following labs and imaging studies  CBC: Recent Labs  Lab 11/29/17 1813 11/30/17 0041  11/30/17 0507 12/01/17 0516 12/02/17 0428 12/03/17 0459  WBC 18.1*  --  17.1* 12.2* 13.4* 11.7*  NEUTROABS  --   --  11.5*  --   --   --   HGB 9.1* 8.6* 9.1* 9.2* 9.1* 9.2*  HCT 26.7* 25.6* 26.9* 28.1* 27.5* 28.1*  MCV 86.7  --  87.9 89.8 89.9 91.5  PLT 257  --  256 279 273 010   Basic Metabolic Panel: Recent Labs  Lab 11/28/17 0659 11/29/17 0512 11/30/17 0507 12/01/17 0516 12/02/17 0428  NA 136 137 139 138 139  K 3.6 3.6 3.4* 3.4* 3.5  CL 105 107 106 104 108  CO2 24 23 25 26 27   GLUCOSE 151* 111* 116* 109* 121*  BUN 15 14 7* 7* 14  CREATININE 0.93 0.86 0.84 0.85 0.89  CALCIUM 7.8* 8.0* 8.3* 8.4* 8.3*  MG  --  1.7  --   --   --   PHOS  --  2.2*  --   --   --    GFR: Estimated Creatinine Clearance: 60.3 mL/min (by C-G formula based on SCr of 0.89 mg/dL). Liver Function Tests: No results for input(s): AST, ALT, ALKPHOS, BILITOT, PROT, ALBUMIN in the last 168 hours. No results for input(s): LIPASE, AMYLASE in the last 168 hours. No results for input(s): AMMONIA in the last 168 hours. Coagulation Profile: Recent Labs  Lab 11/29/17 0512  INR 1.38   Cardiac Enzymes: No results for input(s): CKTOTAL, CKMB, CKMBINDEX, TROPONINI in the last 168 hours. BNP (last 3 results) No results for input(s): PROBNP in the last 8760 hours. HbA1C: No results for input(s): HGBA1C in the last 72 hours. CBG: Recent Labs  Lab 11/29/17 0733 11/30/17 0729 12/01/17 0801 12/02/17 0722 12/03/17 0747  GLUCAP 116* 112* 103* 120* 112*   Lipid Profile: No results for input(s): CHOL, HDL, LDLCALC, TRIG, CHOLHDL, LDLDIRECT in the last 72 hours. Thyroid Function Tests: No results for input(s): TSH, T4TOTAL, FREET4, T3FREE, THYROIDAB in the last 72 hours. Anemia Panel: No results for input(s): VITAMINB12, FOLATE, FERRITIN, TIBC, IRON, RETICCTPCT in the last 72 hours. Sepsis Labs: No results for input(s): PROCALCITON, LATICACIDVEN in the last 168 hours.  No results found for this or any  previous visit (from the past 240 hour(s)).   Radiology Studies: No results found.  Scheduled Meds: . chlorhexidine  15 mL Mouth Rinse BID  . Chlorhexidine Gluconate Cloth  6 each Topical Daily  . ciprofloxacin  500 mg Oral BID  . divalproex  250 mg Oral Q8H  . mouth rinse  15 mL Mouth Rinse q12n4p  . nicotine  7 mg Transdermal Daily  . pantoprazole  40 mg Oral BID AC  . QUEtiapine  12.5 mg Oral QHS  . sodium chloride flush  10-40 mL Intracatheter Q12H  . sodium chloride flush  3 mL Intravenous Q12H   Continuous Infusions:   LOS: 9 days  [name], MD [facility] Pager [phone]  If 7PM-7AM, please contact night-coverage [email or web address] Password [email or web address] 12/03/2017, 5:05 PM

## 2017-12-04 ENCOUNTER — Inpatient Hospital Stay (HOSPITAL_COMMUNITY)
Admission: RE | Admit: 2017-12-04 | Discharge: 2017-12-26 | DRG: 092 | Disposition: A | Discharge status: Home Health | Payer: Medicare Other | Source: Intra-hospital | Attending: Physical Medicine & Rehabilitation | Admitting: Physical Medicine & Rehabilitation

## 2017-12-04 ENCOUNTER — Encounter (HOSPITAL_COMMUNITY): Payer: Self-pay | Admitting: *Deleted

## 2017-12-04 DIAGNOSIS — E8809 Other disorders of plasma-protein metabolism, not elsewhere classified: Secondary | ICD-10-CM | POA: Diagnosis present

## 2017-12-04 DIAGNOSIS — F05 Delirium due to known physiological condition: Secondary | ICD-10-CM

## 2017-12-04 DIAGNOSIS — I69391 Dysphagia following cerebral infarction: Secondary | ICD-10-CM | POA: Diagnosis not present

## 2017-12-04 DIAGNOSIS — R0989 Other specified symptoms and signs involving the circulatory and respiratory systems: Secondary | ICD-10-CM | POA: Diagnosis not present

## 2017-12-04 DIAGNOSIS — R410 Disorientation, unspecified: Secondary | ICD-10-CM

## 2017-12-04 DIAGNOSIS — R350 Frequency of micturition: Secondary | ICD-10-CM | POA: Diagnosis present

## 2017-12-04 DIAGNOSIS — I959 Hypotension, unspecified: Secondary | ICD-10-CM | POA: Diagnosis not present

## 2017-12-04 DIAGNOSIS — Z888 Allergy status to other drugs, medicaments and biological substances status: Secondary | ICD-10-CM | POA: Diagnosis not present

## 2017-12-04 DIAGNOSIS — Z720 Tobacco use: Secondary | ICD-10-CM | POA: Diagnosis not present

## 2017-12-04 DIAGNOSIS — I482 Chronic atrial fibrillation: Secondary | ICD-10-CM | POA: Diagnosis present

## 2017-12-04 DIAGNOSIS — I69319 Unspecified symptoms and signs involving cognitive functions following cerebral infarction: Secondary | ICD-10-CM | POA: Diagnosis not present

## 2017-12-04 DIAGNOSIS — F419 Anxiety disorder, unspecified: Secondary | ICD-10-CM | POA: Diagnosis present

## 2017-12-04 DIAGNOSIS — I63511 Cerebral infarction due to unspecified occlusion or stenosis of right middle cerebral artery: Secondary | ICD-10-CM | POA: Diagnosis present

## 2017-12-04 DIAGNOSIS — Z8719 Personal history of other diseases of the digestive system: Secondary | ICD-10-CM

## 2017-12-04 DIAGNOSIS — R2689 Other abnormalities of gait and mobility: Principal | ICD-10-CM | POA: Diagnosis present

## 2017-12-04 DIAGNOSIS — Z79899 Other long term (current) drug therapy: Secondary | ICD-10-CM | POA: Diagnosis not present

## 2017-12-04 DIAGNOSIS — I69321 Dysphasia following cerebral infarction: Secondary | ICD-10-CM | POA: Diagnosis not present

## 2017-12-04 DIAGNOSIS — E785 Hyperlipidemia, unspecified: Secondary | ICD-10-CM | POA: Diagnosis present

## 2017-12-04 DIAGNOSIS — E46 Unspecified protein-calorie malnutrition: Secondary | ICD-10-CM | POA: Diagnosis not present

## 2017-12-04 DIAGNOSIS — K219 Gastro-esophageal reflux disease without esophagitis: Secondary | ICD-10-CM | POA: Diagnosis present

## 2017-12-04 DIAGNOSIS — D72829 Elevated white blood cell count, unspecified: Secondary | ICD-10-CM | POA: Diagnosis present

## 2017-12-04 DIAGNOSIS — I69154 Hemiplegia and hemiparesis following nontraumatic intracerebral hemorrhage affecting left non-dominant side: Secondary | ICD-10-CM

## 2017-12-04 DIAGNOSIS — D62 Acute posthemorrhagic anemia: Secondary | ICD-10-CM | POA: Diagnosis present

## 2017-12-04 DIAGNOSIS — G479 Sleep disorder, unspecified: Secondary | ICD-10-CM | POA: Diagnosis not present

## 2017-12-04 DIAGNOSIS — K5900 Constipation, unspecified: Secondary | ICD-10-CM | POA: Diagnosis present

## 2017-12-04 DIAGNOSIS — Z8249 Family history of ischemic heart disease and other diseases of the circulatory system: Secondary | ICD-10-CM

## 2017-12-04 DIAGNOSIS — I69322 Dysarthria following cerebral infarction: Secondary | ICD-10-CM

## 2017-12-04 DIAGNOSIS — R7303 Prediabetes: Secondary | ICD-10-CM | POA: Diagnosis present

## 2017-12-04 DIAGNOSIS — I10 Essential (primary) hypertension: Secondary | ICD-10-CM | POA: Diagnosis present

## 2017-12-04 DIAGNOSIS — I639 Cerebral infarction, unspecified: Secondary | ICD-10-CM | POA: Diagnosis not present

## 2017-12-04 DIAGNOSIS — Z880 Allergy status to penicillin: Secondary | ICD-10-CM

## 2017-12-04 DIAGNOSIS — Z950 Presence of cardiac pacemaker: Secondary | ICD-10-CM

## 2017-12-04 DIAGNOSIS — R0600 Dyspnea, unspecified: Secondary | ICD-10-CM | POA: Diagnosis not present

## 2017-12-04 DIAGNOSIS — K264 Chronic or unspecified duodenal ulcer with hemorrhage: Secondary | ICD-10-CM

## 2017-12-04 DIAGNOSIS — K5901 Slow transit constipation: Secondary | ICD-10-CM | POA: Diagnosis not present

## 2017-12-04 DIAGNOSIS — G8114 Spastic hemiplegia affecting left nondominant side: Secondary | ICD-10-CM | POA: Diagnosis not present

## 2017-12-04 LAB — GLUCOSE, CAPILLARY: Glucose-Capillary: 100 mg/dL — ABNORMAL HIGH (ref 70–99)

## 2017-12-04 MED ORDER — PROCHLORPERAZINE EDISYLATE 10 MG/2ML IJ SOLN: 5.0000 mg | Freq: Four times a day (QID) | INTRAMUSCULAR | Status: DC | PRN

## 2017-12-04 MED ORDER — DIVALPROEX SODIUM 125 MG PO CSDR
250.0000 mg | DELAYED_RELEASE_CAPSULE | Freq: Three times a day (TID) | ORAL | Status: DC
  Administered 2017-12-04 – 2017-12-18 (×41): 250 mg via ORAL
  Filled 2017-12-04 (×42): qty 2

## 2017-12-04 MED ORDER — GUAIFENESIN-DM 100-10 MG/5ML PO SYRP: 5.0000 mL | ORAL_SOLUTION | Freq: Four times a day (QID) | ORAL | Status: DC | PRN

## 2017-12-04 MED ORDER — QUETIAPINE FUMARATE 25 MG PO TABS: 12.5000 mg | ORAL_TABLET | Freq: Every day | ORAL | Status: DC

## 2017-12-04 MED ORDER — FLEET ENEMA 7-19 GM/118ML RE ENEM
1.0000 | ENEMA | Freq: Once | RECTAL | Status: AC | PRN
  Administered 2017-12-22: 1 via RECTAL
  Filled 2017-12-04: qty 1

## 2017-12-04 MED ORDER — RESOURCE THICKENUP CLEAR PO POWD
1.0000 | ORAL | Status: DC | PRN
  Filled 2017-12-04 (×4): qty 125

## 2017-12-04 MED ORDER — TRAZODONE HCL 50 MG PO TABS: 25.0000 mg | ORAL_TABLET | Freq: Every evening | ORAL | Status: DC | PRN

## 2017-12-04 MED ORDER — PANTOPRAZOLE SODIUM 40 MG PO TBEC: 40.0000 mg | DELAYED_RELEASE_TABLET | Freq: Every day | ORAL | Status: DC

## 2017-12-04 MED ORDER — ALUM & MAG HYDROXIDE-SIMETH 200-200-20 MG/5ML PO SUSP
30.0000 mL | ORAL | Status: DC | PRN
  Administered 2017-12-18: 30 mL via ORAL
  Filled 2017-12-04: qty 30

## 2017-12-04 MED ORDER — PROCHLORPERAZINE MALEATE 5 MG PO TABS: 5.0000 mg | ORAL_TABLET | Freq: Four times a day (QID) | ORAL | Status: DC | PRN

## 2017-12-04 MED ORDER — PROCHLORPERAZINE 25 MG RE SUPP: 12.5000 mg | Freq: Four times a day (QID) | RECTAL | Status: DC | PRN

## 2017-12-04 MED ORDER — ALUM & MAG HYDROXIDE-SIMETH 200-200-20 MG/5ML PO SUSP: 30.0000 mL | ORAL | Status: DC | PRN

## 2017-12-04 MED ORDER — ORAL CARE MOUTH RINSE
15.0000 mL | Freq: Two times a day (BID) | OROMUCOSAL | Status: DC
  Administered 2017-12-04 – 2017-12-25 (×33): 15 mL via OROMUCOSAL

## 2017-12-04 MED ORDER — SENNOSIDES-DOCUSATE SODIUM 8.6-50 MG PO TABS: 2.0000 | ORAL_TABLET | Freq: Every day | ORAL | Status: DC

## 2017-12-04 MED ORDER — CHLORHEXIDINE GLUCONATE 0.12 % MT SOLN
15.0000 mL | Freq: Two times a day (BID) | OROMUCOSAL | Status: DC
  Administered 2017-12-04 – 2017-12-26 (×44): 15 mL via OROMUCOSAL
  Filled 2017-12-04 (×42): qty 15

## 2017-12-04 MED ORDER — PROCHLORPERAZINE MALEATE 5 MG PO TABS
5.0000 mg | ORAL_TABLET | Freq: Four times a day (QID) | ORAL | Status: DC | PRN
  Administered 2017-12-10: 10 mg via ORAL
  Filled 2017-12-04: qty 2

## 2017-12-04 MED ORDER — BISACODYL 10 MG RE SUPP
10.0000 mg | Freq: Every day | RECTAL | Status: DC | PRN
  Administered 2017-12-25: 10 mg via RECTAL
  Filled 2017-12-04: qty 1

## 2017-12-04 MED ORDER — SENNA 8.6 MG PO TABS
2.0000 | ORAL_TABLET | Freq: Two times a day (BID) | ORAL | Status: DC
  Administered 2017-12-04 – 2017-12-20 (×27): 17.2 mg via ORAL
  Filled 2017-12-04 (×30): qty 2

## 2017-12-04 MED ORDER — ACETAMINOPHEN 325 MG PO TABS
325.0000 mg | ORAL_TABLET | ORAL | Status: DC | PRN
  Administered 2017-12-08: 325 mg via ORAL
  Administered 2017-12-12 – 2017-12-13 (×2): 650 mg via ORAL
  Filled 2017-12-04 (×3): qty 2

## 2017-12-04 MED ORDER — PANTOPRAZOLE SODIUM 40 MG PO TBEC
40.0000 mg | DELAYED_RELEASE_TABLET | Freq: Two times a day (BID) | ORAL | Status: DC
Start: 2017-12-04 — End: 2017-12-07
  Administered 2017-12-04 – 2017-12-06 (×5): 40 mg via ORAL
  Filled 2017-12-04 (×6): qty 1

## 2017-12-04 MED ORDER — QUETIAPINE FUMARATE 25 MG PO TABS
12.5000 mg | ORAL_TABLET | Freq: Every day | ORAL | Status: DC
  Administered 2017-12-04: 12.5 mg via ORAL
  Filled 2017-12-04: qty 1

## 2017-12-04 MED ORDER — NICOTINE 7 MG/24HR TD PT24
7.0000 mg | MEDICATED_PATCH | Freq: Every day | TRANSDERMAL | Status: DC
  Administered 2017-12-05 – 2017-12-26 (×22): 7 mg via TRANSDERMAL
  Filled 2017-12-04 (×22): qty 1

## 2017-12-04 MED ORDER — ACETAMINOPHEN 325 MG PO TABS
325.0000 mg | ORAL_TABLET | ORAL | Status: DC | PRN
Start: 2017-12-04 — End: 2017-12-04

## 2017-12-04 MED ORDER — DIPHENHYDRAMINE HCL 12.5 MG/5ML PO ELIX
12.5000 mg | ORAL_SOLUTION | Freq: Four times a day (QID) | ORAL | Status: DC | PRN
  Administered 2017-12-24: 12.5 mg via ORAL
  Filled 2017-12-04: qty 10

## 2017-12-04 NOTE — Progress Notes (Signed)
Patient is admitting to CIR today. CSW signing off. Please re-consult if needed.  Percell Locus Gearald Stonebraker LCSW [phone]

## 2017-12-04 NOTE — Discharge Summary (Signed)
Physician Discharge Summary  CREEDENCE KUNESH CXK:[id number] DOB: [birth year] DOA: 11/24/2017  PCP: Tonia Ghent, MD  Admit date: 11/24/2017 Discharge date: 12/04/2017  Admitted From: CIR Disposition:  CIR  Recommendations for Outpatient Follow-up:  1. Follow up with PCP in 1-2 weeks  Discharge Condition:Stable CODE STATUS:Full Diet recommendation: Dysphagia 1 with nectar thick liquids   Brief/Interim Summary: 84 y/o malewhowas admitted on 7/19 with an acute R MCA stroke requiring revascularization in interventional radiology, then he was transferred to a rehab facility where he developed a GI bleed. He was sent back to [facility] for further evaluation. Anticoagulation for secondary stroke prevention (afib) was held on admission but re-introduced again. He developed a GI bleed again and was moved to the ICU  Acute blood loss anemia secondary to Duodenal ulcer  -Recommendation to not re-introduce anticoagulation -Will continue PO PPI as tolerated  Hemorrhagic shock- secondary to acute blood loss anemia -labs reviewed, hgb had been stable  Esophageal stricture-Dysphagia -Recommendation for Dysphagia 1 dietw/ nectar thick liquids as per SLP suggestion -Presently stable   ChronicAfib -Now off anticoag due to recurrent GIB -Patient remains rate controlled at present  CHB s/p pacer -Stable at present  GERD -Patient to continue PPI as per above  UTI -Urine culturefrom 7/24 noted >100K Enterobacter cloacae - pt completed 6 days of abx from 7/25 > 7/30 but first 2 days were w/ macrobid to which the organism is only intermed sensitive  -Completed course of cipro as of 8/5  Recent R MCA stroke -continue with ongoing PT/SLP/OT -CIR has been consulted. To return to CIR  Severe acute delirium overnight -Patient noted to have a long hx of sundowning per son  -Recommendation to utilize LOW DOSE seroquel at [place] and follow     Discharge Diagnoses:   Principal Problem:   Hypovolemic shock ([place]) Active Problems:   HLD (hyperlipidemia)   Essential hypertension, benign   CAD, NATIVE VESSEL   Atrial fibrillation ([name])   CHB (complete heart block) - s/p MDT 1 lead PPM   Cerebrovascular accident (CVA) due to thrombosis of right middle cerebral artery ([place])   GIB (gastrointestinal bleeding)    Discharge Instructions   Allergies as of 12/04/2017      Reactions   Warfarin And Related Other (See Comments)   Lost a lot of weight and also experienced numbness   Atorvastatin Other (See Comments)   REACTION: MUSCLE PAIN   Ezetimibe Other (See Comments)   REACTION: MUSCLE PAIN   Diazepam Other (See Comments)   UNKNOWN, per pt   Penicillins Other (See Comments)   UNKNOWN, per pt      Medication List    STOP taking these medications   ALPRAZolam 0.5 MG tablet Commonly known as:  XANAX   apixaban 5 MG Tabs tablet Commonly known as:  ELIQUIS   calcium carbonate 500 MG chewable tablet Commonly known as:  TUMS - dosed in mg elemental calcium   carvedilol 25 MG tablet Commonly known as:  COREG   docusate sodium 100 MG capsule Commonly known as:  COLACE   hydrochlorothiazide 12.5 MG capsule Commonly known as:  MICROZIDE   MILK OF MAGNESIA PO   nitroGLYCERIN 0.4 MG SL tablet Commonly known as:  NITROSTAT   pravastatin 20 MG tablet Commonly known as:  PRAVACHOL   ranitidine 150 MG tablet Commonly known as:  ZANTAC     TAKE these medications   divalproex 125 MG capsule Commonly known as:  DEPAKOTE SPRINKLE Take 2  capsules (250 mg total) by mouth every 8 (eight) hours.   nicotine 7 mg/24hr patch Commonly known as:  NICODERM CQ - dosed in mg/24 hr Place 1 patch (7 mg total) onto the skin daily.   pantoprazole 40 MG tablet Commonly known as:  PROTONIX Take 1 tablet (40 mg total) by mouth daily.   QUEtiapine 25 MG tablet Commonly known as:  SEROQUEL Take 0.5 tablets (12.5 mg total) by mouth at bedtime. What  changed:    when to take this  Another medication with the same name was removed. Continue taking this medication, and follow the directions you see here.   RESOURCE THICKENUP CLEAR Powd Take 120 g by mouth as needed (nectar thick liquid consistency).      Follow-up Information    Tonia Ghent, MD. Schedule an appointment as soon as possible for a visit in 2 week(s).   Specialty:  Family Medicine Contact information: [place] [address] [phone]          Allergies  Allergen Reactions  . Warfarin And Related Other (See Comments)    Lost a lot of weight and also experienced numbness  . Atorvastatin Other (See Comments)    REACTION: MUSCLE PAIN  . Ezetimibe Other (See Comments)    REACTION: MUSCLE PAIN  . Diazepam Other (See Comments)    UNKNOWN, per pt  . Penicillins Other (See Comments)    UNKNOWN, per pt    Consultations:  CIR  GI  Procedures/Studies: Ct Angio Head W Or Wo Contrast  Result Date: 11/16/2017 CLINICAL DATA:  84 year old male code stroke. Altered mental status. Evidence of right basal ganglia/MCA lacunar and possible hyperdense right MCA on noncontrast head CT 1309 hours today. EXAM: CT ANGIOGRAPHY HEAD AND NECK CT PERFUSION BRAIN TECHNIQUE: Multidetector CT imaging of the head and neck was performed using the standard protocol during bolus administration of intravenous contrast. Multiplanar CT image reconstructions and MIPs were obtained to evaluate the vascular anatomy. Carotid stenosis measurements (when applicable) are obtained utilizing NASCET criteria, using the distal internal carotid diameter as the denominator. Multiphase CT imaging of the brain was performed following IV bolus contrast injection. Subsequent parametric perfusion maps were calculated using RAPID software. CONTRAST:  27mL ISOVUE-370 IOPAMIDOL (ISOVUE-370) INJECTION 76% COMPARISON:  Head CT without contrast 1309 hours today. FINDINGS: CT Brain Perfusion  Findings: ASPECTS approximately 7 on the earlier noncontrast CT. CBF (<30%) Volume: 0 milliliters (CBF less than 38% the techs 5 milliliters along the posterior operculum). Perfusion (Tmax>6.0s) volume: 41 milliliters Mismatch Volume: 41 milliliters Infarction Location:Posterior right MCA territory. Other findings:  Some motion artifact noted CTA NECK Skeleton: Absent dentition. No acute osseous abnormality identified. Upper chest: Left chest cardiac pacemaker type device. Mild dependent pulmonary atelectasis. No superior mediastinal lymphadenopathy. Other neck: Negative.  No neck mass or lymphadenopathy. Aortic arch: 3 vessel arch configuration. Mild to moderate arch atherosclerosis. Right carotid system: No brachiocephalic or right CCA origin stenosis. Minimal right CCA plaque including at the right carotid bifurcation. Mild soft plaque at the right ICA origin. No cervical right ICA stenosis. Left carotid system: No left CCA origin stenosis. Minimal left CCA plaque including at the left carotid bifurcation. Mild proximal left ICA plaque with no stenosis. Vertebral arteries: No proximal right subclavian artery stenosis. Normal right vertebral artery origin. Patent right vertebral artery to the skull base without stenosis. No proximal left subclavian artery stenosis despite mild plaque. Soft and calcified plaque at the left vertebral artery origin and  proximal V1 segment (series 10, image 29), with only mild stenosis. Codominant vertebral arteries, the left is patent to the skull base with mild V3 segment plaque but no stenosis. CTA HEAD Posterior circulation: Codominant distal vertebral arteries are patent without stenosis. Patent PICA origins and vertebrobasilar junction. Patent basilar artery without stenosis. Normal SCA and left PCA origin. Tortuous right P1 segment and right posterior communicating artery. The left Posterior communicating artery is diminutive or absent. Bilateral PCA branches are within normal  limits. Anterior circulation: Both ICA siphons are patent. There is only minor siphon calcified plaque, greater on the left. Normal ophthalmic and right posterior communicating artery origins. Patent carotid termini. Normal MCA and ACA origins. Mildly tortuous A1 segments. Anterior communicating artery and bilateral ACA branches are within normal limits. Left MCA M1 segment is tortuous. Left MCA bifurcation is patent without stenosis. Left MCA branches are within normal limits. Right MCA M1 segment is tortuous but patent without stenosis. Right MCA bifurcation is patent. No right MCA M2 branch occlusion identified. Areas of moderate to severe right M3 branch irregularity are noted on series 14, image 15. Venous sinuses: Patent. Anatomic variants: None. Review of the MIP images confirms the above findings IMPRESSION: 1. Negative for large vessel occlusion. No discrete right MCA branch occlusion is identified, but moderate to severe right M3 branch irregularity is noted. 2. CT perfusion suggests posterior right MCA territory penumbra, but does not detect core infarct with the standard parameters. However, a small 5 mL infarct is suggested with more sensitive CBF (<38%) parameter, located along the posterior operculum. Therefore, it is possible that the right lentiform finding is chronic. 3. Mild for age atherosclerosis in the head and neck with no hemodynamically significant arterial stenosis identified outside of the right M3 findings in #1. 4.  Study discussed by telephone on 11/16/2017 at 15:19 to Dr. Rory Percy. Electronically Signed   By: Genevie Ann M.D.   On: 11/16/2017 15:20   Dg Chest 1 View  Result Date: 11/28/2017 CLINICAL DATA:  Status post PICC line placement. EXAM: CHEST  1 VIEW COMPARISON:  Radiographs of November 21, 2017. FINDINGS: Stable cardiomegaly. Single lead left-sided pacemaker is unchanged in position. Interval placement of right internal jugular catheter with distal tip in expected position of the SVC.  No pneumothorax or pleural effusion is noted. Bony thorax is unremarkable. IMPRESSION: Interval placement of right internal jugular catheter with distal tip in expected position of the SVC. No pneumothorax is noted. Electronically Signed   By: Marijo Conception, M.D.   On: 11/28/2017 12:40   Dg Chest 2 View  Result Date: 11/21/2017 CLINICAL DATA:  Leukocytosis. EXAM: CHEST - 2 VIEW COMPARISON:  Radiograph of November 18, 2017. FINDINGS: Stable cardiomegaly. Single lead left-sided pacemaker is unchanged in position. No pneumothorax or pleural effusion is noted. Both lungs are clear. The visualized skeletal structures are unremarkable. IMPRESSION: No active cardiopulmonary disease. Electronically Signed   By: Marijo Conception, M.D.   On: 11/21/2017 21:49   Ct Head Wo Contrast  Result Date: 11/17/2017 CLINICAL DATA:  Delirium, somnolent. Assessment for stroke or bleed. Status post endovascular revascularization of occluded RIGHT M2 segment. EXAM: CT HEAD WITHOUT CONTRAST TECHNIQUE: Contiguous axial images were obtained from the base of the skull through the vertex without intravenous contrast. COMPARISON:  CT HEAD November 16, 2017. FINDINGS: BRAIN: No intraparenchymal hemorrhage, mass effect nor midline shift. New RIGHT temporoparietal and RIGHT insula wedge-like hypodensity with loss of gray-white matter differentiation. Evolving acute RIGHT basal ganglia  infarct. Patchy to confluent supratentorial white matter hypodensities. No parenchymal brain volume loss for age. Cavum septum pellucidum. Patchy to confluent supratentorial white matter hypodensities compatible with moderate chronic small vessel ischemic changes. No abnormal extra-axial fluid collections. VASCULAR: Dense distal RIGHT insular MCA (sagittal image 21). Mild calcific atherosclerosis of the carotid siphons. SKULL: No skull fracture. Subcentimeter probable bone island RIGHT clivus. Mild similar LEFT frontal and LEFT parietal scalp hematomas. SINUSES/ORBITS:  Lobulated sphenoid ethmoidal mucosal thickening without paranasal sinus air-fluid levels. Mastoid air cells are well aerated.The included ocular globes and orbital contents are non-suspicious. Status post LEFT ocular lens implant. OTHER: None. IMPRESSION: 1. Propagation of acute RIGHT MCA territory infarct now involving RIGHT insula and RIGHT temporoparietal lobes. Similar RIGHT basal ganglia acute infarct. No hemorrhagic conversion. 2. Dense distal RIGHT insular MCA consistent with thromboembolism. 3. These results will be called to the ordering clinician or representative by the Radiologist Assistant, and communication documented in the PACS or zVision Dashboard. Electronically Signed   By: Elon Alas M.D.   On: 11/17/2017 17:29   Ct Head Wo Contrast  Result Date: 11/16/2017 CLINICAL DATA:  Altered mental status. EXAM: CT HEAD WITHOUT CONTRAST TECHNIQUE: Contiguous axial images were obtained from the base of the skull through the vertex without intravenous contrast. COMPARISON:  None. FINDINGS: Brain: Hypodensity within the right basal ganglia, external capsule, and posterior operculum. No evidence of hemorrhage, hydrocephalus, extra-axial collection or mass lesion/mass effect. Moderate age related cerebral atrophy. Scattered moderate periventricular and subcortical white matter hypodensities are nonspecific, but favored to reflect chronic microvascular ischemic changes. Vascular: Right distal MCA branch hyperdensity near the operculum likely reflecting thrombus (series 5, image 46). Atherosclerotic vascular calcification of the carotid siphons. Skull: Negative for fracture or focal lesion. Sinuses/Orbits: No acute finding. Other: None. IMPRESSION: 1. Acute to subacute infarct in the right basal ganglia, external capsule, and posterior operculum. Right distal MCA branch hyperdensity near the operculum likely reflects thrombus. These results were called by telephone at the time of interpretation on  11/16/2017 at 1:47 pm to Dr. Carmin Muskrat , who verbally acknowledged these results. Electronically Signed   By: Titus Dubin M.D.   On: 11/16/2017 13:49   Ct Angio Neck W Or Wo Contrast  Result Date: 11/16/2017 CLINICAL DATA:  84 year old male code stroke. Altered mental status. Evidence of right basal ganglia/MCA lacunar and possible hyperdense right MCA on noncontrast head CT 1309 hours today. EXAM: CT ANGIOGRAPHY HEAD AND NECK CT PERFUSION BRAIN TECHNIQUE: Multidetector CT imaging of the head and neck was performed using the standard protocol during bolus administration of intravenous contrast. Multiplanar CT image reconstructions and MIPs were obtained to evaluate the vascular anatomy. Carotid stenosis measurements (when applicable) are obtained utilizing NASCET criteria, using the distal internal carotid diameter as the denominator. Multiphase CT imaging of the brain was performed following IV bolus contrast injection. Subsequent parametric perfusion maps were calculated using RAPID software. CONTRAST:  95mL ISOVUE-370 IOPAMIDOL (ISOVUE-370) INJECTION 76% COMPARISON:  Head CT without contrast 1309 hours today. FINDINGS: CT Brain Perfusion Findings: ASPECTS approximately 7 on the earlier noncontrast CT. CBF (<30%) Volume: 0 milliliters (CBF less than 38% the techs 5 milliliters along the posterior operculum). Perfusion (Tmax>6.0s) volume: 41 milliliters Mismatch Volume: 41 milliliters Infarction Location:Posterior right MCA territory. Other findings:  Some motion artifact noted CTA NECK Skeleton: Absent dentition. No acute osseous abnormality identified. Upper chest: Left chest cardiac pacemaker type device. Mild dependent pulmonary atelectasis. No superior mediastinal lymphadenopathy. Other neck: Negative.  No neck mass  or lymphadenopathy. Aortic arch: 3 vessel arch configuration. Mild to moderate arch atherosclerosis. Right carotid system: No brachiocephalic or right CCA origin stenosis. Minimal  right CCA plaque including at the right carotid bifurcation. Mild soft plaque at the right ICA origin. No cervical right ICA stenosis. Left carotid system: No left CCA origin stenosis. Minimal left CCA plaque including at the left carotid bifurcation. Mild proximal left ICA plaque with no stenosis. Vertebral arteries: No proximal right subclavian artery stenosis. Normal right vertebral artery origin. Patent right vertebral artery to the skull base without stenosis. No proximal left subclavian artery stenosis despite mild plaque. Soft and calcified plaque at the left vertebral artery origin and proximal V1 segment (series 10, image 29), with only mild stenosis. Codominant vertebral arteries, the left is patent to the skull base with mild V3 segment plaque but no stenosis. CTA HEAD Posterior circulation: Codominant distal vertebral arteries are patent without stenosis. Patent PICA origins and vertebrobasilar junction. Patent basilar artery without stenosis. Normal SCA and left PCA origin. Tortuous right P1 segment and right posterior communicating artery. The left Posterior communicating artery is diminutive or absent. Bilateral PCA branches are within normal limits. Anterior circulation: Both ICA siphons are patent. There is only minor siphon calcified plaque, greater on the left. Normal ophthalmic and right posterior communicating artery origins. Patent carotid termini. Normal MCA and ACA origins. Mildly tortuous A1 segments. Anterior communicating artery and bilateral ACA branches are within normal limits. Left MCA M1 segment is tortuous. Left MCA bifurcation is patent without stenosis. Left MCA branches are within normal limits. Right MCA M1 segment is tortuous but patent without stenosis. Right MCA bifurcation is patent. No right MCA M2 branch occlusion identified. Areas of moderate to severe right M3 branch irregularity are noted on series 14, image 15. Venous sinuses: Patent. Anatomic variants: None. Review of  the MIP images confirms the above findings IMPRESSION: 1. Negative for large vessel occlusion. No discrete right MCA branch occlusion is identified, but moderate to severe right M3 branch irregularity is noted. 2. CT perfusion suggests posterior right MCA territory penumbra, but does not detect core infarct with the standard parameters. However, a small 5 mL infarct is suggested with more sensitive CBF (<38%) parameter, located along the posterior operculum. Therefore, it is possible that the right lentiform finding is chronic. 3. Mild for age atherosclerosis in the head and neck with no hemodynamically significant arterial stenosis identified outside of the right M3 findings in #1. 4.  Study discussed by telephone on 11/16/2017 at 15:19 to Dr. Rory Percy. Electronically Signed   By: Genevie Ann M.D.   On: 11/16/2017 15:20   [place]  Result Date: 11/20/2017 INDICATION: Acute onset of left-sided weakness, right gaze deviation. NIH score of 12. Occluded distal inferior division of the right middle cerebral artery in the M2 M3 junction. EXAM: 1. EMERGENT LARGE VESSEL OCCLUSION THROMBOLYSIS (anterior CIRCULATION) COMPARISON:  CT angiogram of the head and neck of 11/16/2017. MEDICATIONS: Vancomycin 1 g IV antibiotic was administered within 1 hour of the procedure. ANESTHESIA/SEDATION: General anesthesia CONTRAST:  Isovue 300 approximately 100 mL. FLUOROSCOPY TIME:  Fluoroscopy Time: 28 minutes 30 seconds (2675 mGy). COMPLICATIONS: None immediate. TECHNIQUE: Following a full explanation of the procedure along with the potential associated complications, an informed witnessed consent was obtained from the patient's son. The risks of intracranial hemorrhage of 10%, worsening neurological deficit, ventilator dependency, death and inability to revascularize were all reviewed in detail with the patient's son. The patient was then put  under general anesthesia by the Department of Anesthesiology at [facility]. The  right groin was prepped and draped in the usual sterile fashion. Thereafter using modified Seldinger technique, transfemoral access into the right common femoral artery was obtained without difficulty. Over a 0.035 inch guidewire a 5 French Pinnacle sheath was inserted. Through this, and also over a 0.035 inch guidewire a 5 [place] JB 1 catheter was advanced to the aortic arch region and selectively positioned in the right subclavian artery and the right common carotid artery. FINDINGS: The right subclavian arteriogram demonstrates the right vertebral artery origin to be widely patent. The vessel is seen to opacify to the cranial skull base where it opacifies the right posterior-inferior cerebellar artery. Distal opacification into the right vertebrobasilar junction is also seen. The right common carotid arteriogram demonstrates the right external carotid artery and its major branches to be widely patent. The right internal carotid artery at the bulb demonstrates a smooth shallow plaque along the posterior wall without associated significant stenosis. More distally, the vessel is seen to opacify to the cranial skull base. The petrous, the cavernous and the supraclinoid segments demonstrate wide patency. The right middle cerebral artery and the right anterior cerebral artery opacify into the capillary and venous phases. The arterial phase demonstrates occlusion of a prominent inferior division of the right middle cerebral artery in the M2 M3 region. On the delayed arterial phase a moderate-sized hypoperfused area is seen correlating with the [place] suspected on the rapid perfusion study. PROCEDURE: ENDOVASCULAR COMPLETE REVASCULARIZATION OF OCCLUDED PROMINENT DISTAL INFERIOR DIVISION BRANCH OF THE RIGHT MIDDLE CEREBRAL ARTERY IN THE M2 M3 REGION WITH 1 PASS USING THE TREVO PROVUE 3 MM X 20 MM RETRIEVAL DEVICE ACHIEVING A TICI 2B REPERFUSION The diagnostic JB 1 catheter in the right common carotid artery was  exchanged over a 300 cm Rosen exchange guidewire for a 55 cm 8 French neurovascular sheath using biplane roadmap technique and constant fluoroscopic guidance. Good aspiration was obtained from the hub of the neurovascular sheath. This was then connected to continuous heparinized saline infusion. Over the [facility] exchange guidewire, an 85 cm 8 [place] FlowGate balloon guide catheter which had been prepped with 50% contrast and 50% heparinized saline infusion was advanced and positioned just inside the right internal carotid artery. The guidewire was removed. Good aspiration obtained from the hub of the [facility] guide catheter. A control arteriogram performed through this demonstrated no evidence of dissections or of occlusions. Distal intracranial circulation remained unchanged. At this time, in a coaxial manner and with constant heparinized saline infusion using biplane roadmap technique and constant fluoroscopic guidance, over a 0.014 inch Soft-tip Synchro micro guidewire, a combination of a Navien 125 5 [place] guide catheter inside of which was an 021 Trevo ProVue microcatheter was advanced to the distal end of the [facility] guide catheter in the right internal carotid artery proximal 1/3. With the micro guidewire leading with a J-tip configuration, the combination was navigated without difficulty to the supraclinoid right ICA. A torque device was used to advance the micro guidewire into the right middle cerebral artery followed by the microcatheter. This combination was then navigated without difficulty through the inferior division branch into the M2 M3 occluded branch followed by the microcatheter. The guidewire was removed. Good aspiration obtained from the hub of the microcatheter. A gentle contrast injection demonstrated wide patency of the branch occlusion. This was then connected to continuous heparinized saline infusion. A Trevo ProVue retrieval device measuring 3 mm x 20 mm  was then advanced to the distal  end of the microcatheter using biplane roadmap technique and constant fluoroscopic guidance in a coaxial manner and with constant heparinized saline infusion. The distal and the proximal landing zones were then defined. The O ring on the delivery microcatheter was then loosened. With slight forward gentle traction with the right hand on the delivery micro guidewire, with the left hand the delivery microcatheter was then retrieved unsheathing the distal and then the proximal portion of the retrieval device. At this time, the Navien 5 [place] guide catheter was advanced into the proximal right middle cerebral M1 segment. Proximal flow arrest was then initiated by inflating the balloon of the [facility] guide catheter in the right internal carotid artery. Thereafter, the microcatheter which was advanced just proximal to the retrieval device was then retrieved in combination with the retrieval device and the 5 [place] Navien guide catheter as constant aspiration was applied with a 60 mL syringe at the hub of the [facility] guide catheter, and with a 20 mL syringe at the hub of the 5 [place] Navien guide catheter. The combination was then retrieved and removed with aspirations continuing at the hub of the [facility] guide catheter with a 60 mL syringe. Aspiration was continued as the balloon in the right internal carotid artery was then deflated. Brisk back bleed at the hub of the [employer] was seen. A control arteriogram performed through the right internal carotid artery demonstrated near complete revascularization of the previously occluded inferior division of the M2 M3 region. Spasm was seen at the proximal right internal carotid artery and also the right middle cerebral M1 segment. Approximately 3 aliquots of 25 mcg of nitroglycerin were then infused into the right internal carotid artery via the FlowGate guide catheter with progressive relief of the vasospasm. Improved perfusion was seen in the previously noted occluded  right middle cerebral artery inferior division. The retrieval device revealed the presence of approximately 2.5 mm x 1.5 mm clot entangled in the struts of the retrieval device. A final control arteriogram performed through the [facility] guide catheter in the right internal carotid artery demonstrated continued near complete revascularization of the previously noted occluded inferior division branch of the right middle cerebral artery in the M2 M3 region. A TICI 2b revascularization had been achieved. No evidence of extravasation, mass effect or midline shift was noted. Throughout the procedure, the patient's hemodynamic status was controlled appropriately. The 8 [place] FlowGate guide catheter was then retrieved into the abdominal aorta as well as the 8 [place] Brite tip neurovascular sheath. These were then in turn exchanged over a J-tip guidewire for an 8 [place] Pinnacle sheath. This in turn was then removed with the successful application of an ExoSeal 7 French closure device with good hemostasis. No evidence of a hematoma or bleeding was seen. The patient's distal pulses in the dorsalis pedis, and the posterior tibials remained palpable in both feet unchanged from prior to the beginning of the procedure. The patient's Dyna CT revealed no gross evidence of a hemorrhage or mass effect or midline shift. There was a focal area of contrast stain in the right mid parietal subcortical region. The patient's general anesthesia was then reversed and the patient was then extubated without difficulty. Upon recovery, the patient was able to obey simple commands without difficulty. He was able to bend his left knee. His left arm was slow to respond. IMPRESSION: Status post endovascular near complete revascularization of the occluded prominent inferior division of the left middle cerebral  artery in the M2 M3 region with 1 pass with the 3 mm x 20 mm Trevo ProVue retrieval device achieving a TICI 2b revascularization. PLAN: The  patient was then transferred to PACU and then to neuro ICU to continue with further stroke management. Electronically Signed   By: Luanne Bras M.D.   On: 11/17/2017 15:46   Ct Cerebral Perfusion W Contrast  Result Date: 11/16/2017 CLINICAL DATA:  84 year old male code stroke. Altered mental status. Evidence of right basal ganglia/MCA lacunar and possible hyperdense right MCA on noncontrast head CT 1309 hours today. EXAM: CT ANGIOGRAPHY HEAD AND NECK CT PERFUSION BRAIN TECHNIQUE: Multidetector CT imaging of the head and neck was performed using the standard protocol during bolus administration of intravenous contrast. Multiplanar CT image reconstructions and MIPs were obtained to evaluate the vascular anatomy. Carotid stenosis measurements (when applicable) are obtained utilizing NASCET criteria, using the distal internal carotid diameter as the denominator. Multiphase CT imaging of the brain was performed following IV bolus contrast injection. Subsequent parametric perfusion maps were calculated using RAPID software. CONTRAST:  36mL ISOVUE-370 IOPAMIDOL (ISOVUE-370) INJECTION 76% COMPARISON:  Head CT without contrast 1309 hours today. FINDINGS: CT Brain Perfusion Findings: ASPECTS approximately 7 on the earlier noncontrast CT. CBF (<30%) Volume: 0 milliliters (CBF less than 38% the techs 5 milliliters along the posterior operculum). Perfusion (Tmax>6.0s) volume: 41 milliliters Mismatch Volume: 41 milliliters Infarction Location:Posterior right MCA territory. Other findings:  Some motion artifact noted CTA NECK Skeleton: Absent dentition. No acute osseous abnormality identified. Upper chest: Left chest cardiac pacemaker type device. Mild dependent pulmonary atelectasis. No superior mediastinal lymphadenopathy. Other neck: Negative.  No neck mass or lymphadenopathy. Aortic arch: 3 vessel arch configuration. Mild to moderate arch atherosclerosis. Right carotid system: No brachiocephalic or right CCA origin  stenosis. Minimal right CCA plaque including at the right carotid bifurcation. Mild soft plaque at the right ICA origin. No cervical right ICA stenosis. Left carotid system: No left CCA origin stenosis. Minimal left CCA plaque including at the left carotid bifurcation. Mild proximal left ICA plaque with no stenosis. Vertebral arteries: No proximal right subclavian artery stenosis. Normal right vertebral artery origin. Patent right vertebral artery to the skull base without stenosis. No proximal left subclavian artery stenosis despite mild plaque. Soft and calcified plaque at the left vertebral artery origin and proximal V1 segment (series 10, image 29), with only mild stenosis. Codominant vertebral arteries, the left is patent to the skull base with mild V3 segment plaque but no stenosis. CTA HEAD Posterior circulation: Codominant distal vertebral arteries are patent without stenosis. Patent PICA origins and vertebrobasilar junction. Patent basilar artery without stenosis. Normal SCA and left PCA origin. Tortuous right P1 segment and right posterior communicating artery. The left Posterior communicating artery is diminutive or absent. Bilateral PCA branches are within normal limits. Anterior circulation: Both ICA siphons are patent. There is only minor siphon calcified plaque, greater on the left. Normal ophthalmic and right posterior communicating artery origins. Patent carotid termini. Normal MCA and ACA origins. Mildly tortuous A1 segments. Anterior communicating artery and bilateral ACA branches are within normal limits. Left MCA M1 segment is tortuous. Left MCA bifurcation is patent without stenosis. Left MCA branches are within normal limits. Right MCA M1 segment is tortuous but patent without stenosis. Right MCA bifurcation is patent. No right MCA M2 branch occlusion identified. Areas of moderate to severe right M3 branch irregularity are noted on series 14, image 15. Venous sinuses: Patent. Anatomic  variants: None. Review of the  MIP images confirms the above findings IMPRESSION: 1. Negative for large vessel occlusion. No discrete right MCA branch occlusion is identified, but moderate to severe right M3 branch irregularity is noted. 2. CT perfusion suggests posterior right MCA territory penumbra, but does not detect core infarct with the standard parameters. However, a small 5 mL infarct is suggested with more sensitive CBF (<38%) parameter, located along the posterior operculum. Therefore, it is possible that the right lentiform finding is chronic. 3. Mild for age atherosclerosis in the head and neck with no hemodynamically significant arterial stenosis identified outside of the right M3 findings in #1. 4.  Study discussed by telephone on 11/16/2017 at 15:19 to Dr. Rory Percy. Electronically Signed   By: Genevie Ann M.D.   On: 11/16/2017 15:20   Dg Chest Port 1 View  Result Date: 11/29/2017 CLINICAL DATA:  Respiratory failure EXAM: PORTABLE CHEST 1 VIEW COMPARISON:  11/28/2017 FINDINGS: Left pacer and right central line remain in place, unchanged. Heart is upper limits normal in size. Lungs clear. No effusions or acute bony abnormality. IMPRESSION: No active disease. Electronically Signed   By: Rolm Baptise M.D.   On: 11/29/2017 07:20   Dg Chest Port 1 View  Result Date: 11/18/2017 CLINICAL DATA:  Dysphagia, fever, leukocytosis EXAM: PORTABLE CHEST 1 VIEW COMPARISON:  11/16/2017 FINDINGS: Similar low lung volumes. Cardiomegaly with mild vascular congestion. No focal pneumonia, collapse or consolidation. No large effusion or pneumothorax. Exam is rotated to the left. Aorta is atherosclerotic. Single lead left subclavian pacer noted. IMPRESSION: Low lung volumes with cardiomegaly and mild increased vascular congestion. Electronically Signed   By: Jerilynn Mages.  Shick M.D.   On: 11/18/2017 13:44   Dg Chest Port 1 View  Result Date: 11/16/2017 CLINICAL DATA:  Encounter for AMS and SOB EXAM: PORTABLE CHEST - 1 VIEW  COMPARISON:  01/06/2016 FINDINGS: Relatively low lung volumes with crowding of bronchovascular structures. No confluent airspace infiltrate or overt edema. Heart size upper limits normal for technique. No effusion or pneumothorax. Stable left subclavian single lead transvenous pacemaker extending towards the right ventricular apex. Lumbar fixation hardware partially visualized. IMPRESSION: No acute cardiopulmonary disease. Electronically Signed   By: Lucrezia Europe M.D.   On: 11/16/2017 11:49   Dg Swallowing Func-speech Pathology  Result Date: 11/19/2017 Objective Swallowing Evaluation: Type of Study: MBS-Modified Barium Swallow Study  Patient Details Name: BRADIN MCADORY MRN: [id number] Date of Birth: May 29, 1928 Today's Date: 11/19/2017 Time: SLP Start Time (ACUTE ONLY): 1210 -SLP Stop Time (ACUTE ONLY): 1248 SLP Time Calculation (min) (ACUTE ONLY): 38 min Past Medical History: Past Medical History: Diagnosis Date . Anxiety  . Back pain, chronic  . Complete heart block ([place]) 07/18/2014  Medtronic [place] model [id number] (serial number [id number])  singe lead PPM . Esophageal obstruction due to food impaction 2017 . GERD (gastroesophageal reflux disease) 10/2002 . Hyperlipidemia 12/1994 . Hypertension  . Hypokalemia  . Lung collapse 03/1991  Fall . Permanent atrial fibrillation ([name])   Refused coumadin therapy Past Surgical History: Past Surgical History: Procedure Laterality Date . BACK SURGERY  11/91  with hardware fixation . CARDIAC CATHETERIZATION  06/2004  30 % stenosis, EF normal . ESOPHAGOGASTRODUODENOSCOPY N/A 01/06/2016  Procedure: ESOPHAGOGASTRODUODENOSCOPY (EGD);  Surgeon: Otis Brace, MD;  Location: [place];  Service: Gastroenterology;  Laterality: N/A; . EYE SURGERY    Retinal bubble surgery, cataract  . LEFT HEART CATHETERIZATION WITH CORONARY ANGIOGRAM N/A 07/18/2014  Procedure: LEFT HEART CATHETERIZATION WITH CORONARY ANGIOGRAM;  Surgeon: Jettie Booze, MD; Dorene Ar OK, LAD mild  dz, D1 80%, D2 OK,  CFX system OK, RCA OK, PDA 100%, med rx . PERMANENT PACEMAKER INSERTION N/A 07/18/2014  Procedure: PERMANENT PACEMAKER INSERTION;  Surgeon: Thompson Grayer, MD; Medtronic [place] model [phone] (serial number [id number])   . RADIOLOGY WITH ANESTHESIA N/A 11/16/2017  Procedure: RADIOLOGY WITH ANESTHESIA;  Surgeon: Luanne Bras, MD;  Location: [place];  Service: Radiology;  Laterality: N/A; . TEMPORARY PACEMAKER INSERTION N/A 07/18/2014  Procedure: TEMPORARY PACEMAKER INSERTION;  Surgeon: Peter M Martinique, MD;  Location: [facility] CATH LAB;  Service: Cardiovascular;  Laterality: N/A; HPI: Mr. ELIO HADEN is a 84 y.o. male with history of permanent atrial fibrillation (not anticoagulated ), status post Medtronic permanent pacemaker for complete heart block, hypertension, hyperlipidemia, chronic back pain, and anxiety presenting with left-sided weakness and right gaze preference. He did not receive IV t-PA due to late presentation. S/P thrombectomy Dr Estanislado Pandy. CT head - Acute to subacute infarct in the right basal ganglia, external capsule, and posterior operculum. Right distal MCA branch hyperdensity near the operculum likely reflects thrombus.  Subjective: Alert, distractible Assessment / Plan / Recommendation CHL IP CLINICAL IMPRESSIONS 11/19/2017 Clinical Impression Patient presents with moderate oropharyngeal dysphagia due to cognitive, sensory and motor deficits. Pt's sustained attention impacted his awareness of boluses and his ability to follow simple commands/compensatory maneuvers during the assessment. Pt repeatedly distracted by noises made by fluoro machine, turning his head to the right repeatedly despite instruction and tactile cues. In general, pt's sensory deficits impact timing and awareness of boluses. Teaspoon improved sensation and timing of swallow. With cup sips (clinician assisted and self-retrieved), there is premature spillage and prolonged pooling in the pharynx (valleculae and pyriforms) without  awareness, occasional mod-max A required to initiate swallow (verbal, tactile, dry spoon). Oral phase characterized by weak lingual manipulation, decreased bolus cohesion, poor oral containment with premature spillage with all liquids. Pharyngeal stage noted for weak tongue base retraction, reduced epiglottic deflection/laryngeal closure and decreased pharyngeal constriction, with reduced stripping of residue from the pharynx. Moderate residue in the valleculae and occasionally in pyriform sinuses. Pt cued for chin tuck several times; even when he did complete this manuever it did not have a significant impact on clearance of residue. Penetration and trace aspiration occurred with multiple cup sips of nectar, and pt at risk for aspiration with cup sips given the amount and duration of pooling in the pharynx prior to swallow initiation. Also suspect aspiration of teaspoon thin liquid (not captured on fluoro), as pt with wet coughing between frames. Safest consistency/strategy appears to be dys 1, nectar thick liquids by teaspoon only. Pt will require full supervision due to level of cuing required. Slow rate, swallow 2x per bite/sip (dry spoon may be needed to cue), cue pt to cough/clear throat occasionally. He remains at moderate risk for aspiration given cognitive deficits and pharyngeal residue. Son present and SLP educated re: findings and strategies including reducing distractions at mealtimes. SLP will follow up SLP Visit Diagnosis Dysphagia, oral phase (R13.11);Dysphagia, oropharyngeal phase (R13.12);Dysphagia, pharyngeal phase (R13.13) Attention and concentration deficit following -- Frontal lobe and executive function deficit following -- Impact on safety and function Moderate aspiration risk   CHL IP TREATMENT RECOMMENDATION 11/19/2017 Treatment Recommendations F/U MBS in --- days (Comment);Therapy as outlined in treatment plan below   Prognosis 11/19/2017 Prognosis for Safe Diet Advancement Good Barriers to  Reach Goals Cognitive deficits Barriers/Prognosis Comment -- CHL IP DIET RECOMMENDATION 11/19/2017 SLP Diet Recommendations Dysphagia 1 (Puree) solids;Nectar thick liquid Liquid Administration via Spoon;No straw Medication  Administration Crushed with puree Compensations Slow rate;Small sips/bites;Clear throat intermittently;Multiple dry swallows after each bite/sip Postural Changes Seated upright at 90 degrees   CHL IP OTHER RECOMMENDATIONS 11/19/2017 Recommended Consults -- Oral Care Recommendations Oral care BID Other Recommendations Order thickener from pharmacy   CHL IP FOLLOW UP RECOMMENDATIONS 11/19/2017 Follow up Recommendations Skilled Nursing facility   [facility] IP FREQUENCY AND DURATION 11/19/2017 Speech Therapy Frequency (ACUTE ONLY) min 2x/week Treatment Duration 2 weeks      CHL IP ORAL PHASE 11/19/2017 Oral Phase Impaired Oral - Pudding Teaspoon -- Oral - Pudding Cup -- Oral - Honey Teaspoon Weak lingual manipulation;Premature spillage;Decreased bolus cohesion Oral - Honey Cup -- Oral - Nectar Teaspoon Weak lingual manipulation;Decreased bolus cohesion;Premature spillage Oral - Nectar Cup Weak lingual manipulation;Premature spillage;Decreased bolus cohesion Oral - Nectar Straw -- Oral - Thin Teaspoon Weak lingual manipulation;Decreased bolus cohesion;Premature spillage Oral - Thin Cup -- Oral - Thin Straw -- Oral - Puree Weak lingual manipulation;Reduced posterior propulsion;Decreased bolus cohesion Oral - Mech Soft -- Oral - Regular Impaired mastication;Weak lingual manipulation;Reduced posterior propulsion;Holding of bolus;Pocketing in anterior sulcus;Decreased bolus cohesion;Delayed oral transit Oral - Multi-Consistency -- Oral - Pill -- Oral Phase - Comment --  CHL IP PHARYNGEAL PHASE 11/19/2017 Pharyngeal Phase Impaired Pharyngeal- Pudding Teaspoon -- Pharyngeal -- Pharyngeal- Pudding Cup -- Pharyngeal -- Pharyngeal- Honey Teaspoon Delayed swallow initiation-pyriform sinuses;Reduced pharyngeal  peristalsis;Reduced epiglottic inversion;Reduced airway/laryngeal closure;Reduced tongue base retraction;Pharyngeal residue - valleculae;Pharyngeal residue - pyriform Pharyngeal -- Pharyngeal- Honey Cup -- Pharyngeal -- Pharyngeal- Nectar Teaspoon Delayed swallow initiation-pyriform sinuses;Reduced pharyngeal peristalsis;Reduced epiglottic inversion;Reduced airway/laryngeal closure;Reduced tongue base retraction;Penetration/Apiration after swallow;Pharyngeal residue - valleculae;Pharyngeal residue - pyriform Pharyngeal Material does not enter airway Pharyngeal- Nectar Cup Delayed swallow initiation-pyriform sinuses;Reduced pharyngeal peristalsis;Reduced epiglottic inversion;Reduced airway/laryngeal closure;Reduced tongue base retraction;Penetration/Apiration after swallow;Trace aspiration;Pharyngeal residue - valleculae;Pharyngeal residue - pyriform Pharyngeal Material enters airway, remains ABOVE vocal cords and not ejected out;Material enters airway, passes BELOW cords without attempt by patient to eject out (silent aspiration) Pharyngeal- Nectar Straw -- Pharyngeal -- Pharyngeal- Thin Teaspoon Delayed swallow initiation-pyriform sinuses;Reduced pharyngeal peristalsis;Reduced epiglottic inversion;Reduced airway/laryngeal closure;Reduced tongue base retraction;Penetration/Apiration after swallow;Pharyngeal residue - valleculae;Pharyngeal residue - pyriform Pharyngeal (No Data) Pharyngeal- Thin Cup -- Pharyngeal -- Pharyngeal- Thin Straw -- Pharyngeal -- Pharyngeal- Puree Reduced pharyngeal peristalsis;Reduced epiglottic inversion;Delayed swallow initiation-vallecula;Reduced tongue base retraction;Pharyngeal residue - valleculae Pharyngeal -- Pharyngeal- Mechanical Soft -- Pharyngeal -- Pharyngeal- Regular Reduced pharyngeal peristalsis;Reduced epiglottic inversion;Reduced tongue base retraction;Pharyngeal residue - valleculae Pharyngeal -- Pharyngeal- Multi-consistency -- Pharyngeal -- Pharyngeal- Pill --  Pharyngeal -- Pharyngeal Comment --  CHL IP CERVICAL ESOPHAGEAL PHASE 11/19/2017 Cervical Esophageal Phase WFL Pudding Teaspoon -- Pudding Cup -- Honey Teaspoon -- Honey Cup -- Nectar Teaspoon -- Nectar Cup -- Nectar Straw -- Thin Teaspoon -- Thin Cup -- Thin Straw -- Puree -- Mechanical Soft -- Regular -- Multi-consistency -- Pill -- Cervical Esophageal Comment -- Deneise Lever, MS, CCC-SLP Speech-Language Pathologist [phone] No flowsheet data found. Aliene Altes 11/19/2017, 1:12 PM              Ir Percutaneous Art Thrombectomy/infusion Intracranial Inc Diag Angio  Result Date: 11/20/2017 INDICATION: Acute onset of left-sided weakness, right gaze deviation. NIH score of 12. Occluded distal inferior division of the right middle cerebral artery in the M2 M3 junction. EXAM: 1. EMERGENT LARGE VESSEL OCCLUSION THROMBOLYSIS (anterior CIRCULATION) COMPARISON:  CT angiogram of the head and neck of 11/16/2017. MEDICATIONS: Vancomycin 1 g IV antibiotic was administered within 1 hour of the procedure. ANESTHESIA/SEDATION: General anesthesia  CONTRAST:  Isovue 300 approximately 100 mL. FLUOROSCOPY TIME:  Fluoroscopy Time: 28 minutes 30 seconds (2675 mGy). COMPLICATIONS: None immediate. TECHNIQUE: Following a full explanation of the procedure along with the potential associated complications, an informed witnessed consent was obtained from the patient's son. The risks of intracranial hemorrhage of 10%, worsening neurological deficit, ventilator dependency, death and inability to revascularize were all reviewed in detail with the patient's son. The patient was then put under general anesthesia by the Department of Anesthesiology at [facility]. The right groin was prepped and draped in the usual sterile fashion. Thereafter using modified Seldinger technique, transfemoral access into the right common femoral artery was obtained without difficulty. Over a 0.035 inch guidewire a 5 French Pinnacle sheath was inserted.  Through this, and also over a 0.035 inch guidewire a 5 [place] JB 1 catheter was advanced to the aortic arch region and selectively positioned in the right subclavian artery and the right common carotid artery. FINDINGS: The right subclavian arteriogram demonstrates the right vertebral artery origin to be widely patent. The vessel is seen to opacify to the cranial skull base where it opacifies the right posterior-inferior cerebellar artery. Distal opacification into the right vertebrobasilar junction is also seen. The right common carotid arteriogram demonstrates the right external carotid artery and its major branches to be widely patent. The right internal carotid artery at the bulb demonstrates a smooth shallow plaque along the posterior wall without associated significant stenosis. More distally, the vessel is seen to opacify to the cranial skull base. The petrous, the cavernous and the supraclinoid segments demonstrate wide patency. The right middle cerebral artery and the right anterior cerebral artery opacify into the capillary and venous phases. The arterial phase demonstrates occlusion of a prominent inferior division of the right middle cerebral artery in the M2 M3 region. On the delayed arterial phase a moderate-sized hypoperfused area is seen correlating with the Penumbra suspected on the rapid perfusion study. PROCEDURE: ENDOVASCULAR COMPLETE REVASCULARIZATION OF OCCLUDED PROMINENT DISTAL INFERIOR DIVISION BRANCH OF THE RIGHT MIDDLE CEREBRAL ARTERY IN THE M2 M3 REGION WITH 1 PASS USING THE TREVO PROVUE 3 MM X 20 MM RETRIEVAL DEVICE ACHIEVING A TICI 2B REPERFUSION The diagnostic JB 1 catheter in the right common carotid artery was exchanged over a 300 cm Rosen exchange guidewire for a 55 cm 8 French neurovascular sheath using biplane roadmap technique and constant fluoroscopic guidance. Good aspiration was obtained from the hub of the neurovascular sheath. This was then connected to continuous heparinized  saline infusion. Over the [facility] exchange guidewire, an 85 cm 8 [place] FlowGate balloon guide catheter which had been prepped with 50% contrast and 50% heparinized saline infusion was advanced and positioned just inside the right internal carotid artery. The guidewire was removed. Good aspiration obtained from the hub of the [facility] guide catheter. A control arteriogram performed through this demonstrated no evidence of dissections or of occlusions. Distal intracranial circulation remained unchanged. At this time, in a coaxial manner and with constant heparinized saline infusion using biplane roadmap technique and constant fluoroscopic guidance, over a 0.014 inch Soft-tip Synchro micro guidewire, a combination of a Navien 125 5 [place] guide catheter inside of which was an 021 Trevo ProVue microcatheter was advanced to the distal end of the [facility] guide catheter in the right internal carotid artery proximal 1/3. With the micro guidewire leading with a J-tip configuration, the combination was navigated without difficulty to the supraclinoid right ICA. A torque device was used to advance the micro guidewire  into the right middle cerebral artery followed by the microcatheter. This combination was then navigated without difficulty through the inferior division branch into the M2 M3 occluded branch followed by the microcatheter. The guidewire was removed. Good aspiration obtained from the hub of the microcatheter. A gentle contrast injection demonstrated wide patency of the branch occlusion. This was then connected to continuous heparinized saline infusion. A Trevo ProVue retrieval device measuring 3 mm x 20 mm was then advanced to the distal end of the microcatheter using biplane roadmap technique and constant fluoroscopic guidance in a coaxial manner and with constant heparinized saline infusion. The distal and the proximal landing zones were then defined. The O ring on the delivery microcatheter was then loosened.  With slight forward gentle traction with the right hand on the delivery micro guidewire, with the left hand the delivery microcatheter was then retrieved unsheathing the distal and then the proximal portion of the retrieval device. At this time, the Navien 5 [place] guide catheter was advanced into the proximal right middle cerebral M1 segment. Proximal flow arrest was then initiated by inflating the balloon of the [facility] guide catheter in the right internal carotid artery. Thereafter, the microcatheter which was advanced just proximal to the retrieval device was then retrieved in combination with the retrieval device and the 5 [place] Navien guide catheter as constant aspiration was applied with a 60 mL syringe at the hub of the [facility] guide catheter, and with a 20 mL syringe at the hub of the 5 [place] Navien guide catheter. The combination was then retrieved and removed with aspirations continuing at the hub of the [facility] guide catheter with a 60 mL syringe. Aspiration was continued as the balloon in the right internal carotid artery was then deflated. Brisk back bleed at the hub of the [employer] was seen. A control arteriogram performed through the right internal carotid artery demonstrated near complete revascularization of the previously occluded inferior division of the M2 M3 region. Spasm was seen at the proximal right internal carotid artery and also the right middle cerebral M1 segment. Approximately 3 aliquots of 25 mcg of nitroglycerin were then infused into the right internal carotid artery via the FlowGate guide catheter with progressive relief of the vasospasm. Improved perfusion was seen in the previously noted occluded right middle cerebral artery inferior division. The retrieval device revealed the presence of approximately 2.5 mm x 1.5 mm clot entangled in the struts of the retrieval device. A final control arteriogram performed through the [facility] guide catheter in the right internal  carotid artery demonstrated continued near complete revascularization of the previously noted occluded inferior division branch of the right middle cerebral artery in the M2 M3 region. A TICI 2b revascularization had been achieved. No evidence of extravasation, mass effect or midline shift was noted. Throughout the procedure, the patient's hemodynamic status was controlled appropriately. The 8 [place] FlowGate guide catheter was then retrieved into the abdominal aorta as well as the 8 [place] Brite tip neurovascular sheath. These were then in turn exchanged over a J-tip guidewire for an 8 [place] Pinnacle sheath. This in turn was then removed with the successful application of an ExoSeal 7 French closure device with good hemostasis. No evidence of a hematoma or bleeding was seen. The patient's distal pulses in the dorsalis pedis, and the posterior tibials remained palpable in both feet unchanged from prior to the beginning of the procedure. The patient's Dyna CT revealed no gross evidence of a hemorrhage or mass effect or midline  shift. There was a focal area of contrast stain in the right mid parietal subcortical region. The patient's general anesthesia was then reversed and the patient was then extubated without difficulty. Upon recovery, the patient was able to obey simple commands without difficulty. He was able to bend his left knee. His left arm was slow to respond. IMPRESSION: Status post endovascular near complete revascularization of the occluded prominent inferior division of the left middle cerebral artery in the M2 M3 region with 1 pass with the 3 mm x 20 mm Trevo ProVue retrieval device achieving a TICI 2b revascularization. PLAN: The patient was then transferred to PACU and then to neuro ICU to continue with further stroke management. Electronically Signed   By: Luanne Bras M.D.   On: 11/17/2017 15:46     Subjective: Eager to return to CIR  Discharge Exam: Vitals:   12/03/17 2236 12/04/17  0423  BP: 138/65 118/77  Pulse: 87 83  Resp: 18 20  Temp: 99.1 F (37.3 C) 99 F (37.2 C)  SpO2: 96% 94%   Vitals:   12/03/17 0510 12/03/17 1452 12/03/17 2236 12/04/17 0423  BP: 132/76 107/63 138/65 118/77  Pulse: 91 91 87 83  Resp: 18 16 18 20   Temp: 98.6 F (37 C) 98.1 F (36.7 C) 99.1 F (37.3 C) 99 F (37.2 C)  TempSrc: Oral Oral Oral Oral  SpO2: 97% 97% 96% 94%  Weight: 79.6 kg (175 lb 7.8 oz)   78.1 kg (172 lb 2.9 oz)  Height:        General: Pt is alert, awake, not in acute distress Cardiovascular: RRR, S1/S2 +, no rubs, no gallops Respiratory: CTA bilaterally, no wheezing, no rhonchi Abdominal: Soft, NT, ND, bowel sounds + Extremities: no edema, no cyanosis   The results of significant diagnostics from this hospitalization (including imaging, microbiology, ancillary and laboratory) are listed below for reference.     Microbiology: No results found for this or any previous visit (from the past 240 hour(s)).   Labs: BNP (last 3 results) No results for input(s): BNP in the last 8760 hours. Basic Metabolic Panel: Recent Labs  Lab 11/28/17 0659 11/29/17 0512 11/30/17 0507 12/01/17 0516 12/02/17 0428  NA 136 137 139 138 139  K 3.6 3.6 3.4* 3.4* 3.5  CL 105 107 106 104 108  CO2 24 23 25 26 27   GLUCOSE 151* 111* 116* 109* 121*  BUN 15 14 7* 7* 14  CREATININE 0.93 0.86 0.84 0.85 0.89  CALCIUM 7.8* 8.0* 8.3* 8.4* 8.3*  MG  --  1.7  --   --   --   PHOS  --  2.2*  --   --   --    Liver Function Tests: No results for input(s): AST, ALT, ALKPHOS, BILITOT, PROT, ALBUMIN in the last 168 hours. No results for input(s): LIPASE, AMYLASE in the last 168 hours. No results for input(s): AMMONIA in the last 168 hours. CBC: Recent Labs  Lab 11/29/17 1813 11/30/17 0041 11/30/17 0507 12/01/17 0516 12/02/17 0428 12/03/17 0459  WBC 18.1*  --  17.1* 12.2* 13.4* 11.7*  NEUTROABS  --   --  11.5*  --   --   --   HGB 9.1* 8.6* 9.1* 9.2* 9.1* 9.2*  HCT 26.7* 25.6*  26.9* 28.1* 27.5* 28.1*  MCV 86.7  --  87.9 89.8 89.9 91.5  PLT 257  --  256 279 273 307   Cardiac Enzymes: No results for input(s): CKTOTAL, CKMB, CKMBINDEX, TROPONINI in the  last 168 hours. BNP: Invalid input(s): POCBNP CBG: Recent Labs  Lab 11/30/17 0729 12/01/17 0801 12/02/17 0722 12/03/17 0747 12/04/17 0745  GLUCAP 112* 103* 120* 112* 100*   D-Dimer No results for input(s): DDIMER in the last 72 hours. Hgb A1c No results for input(s): HGBA1C in the last 72 hours. Lipid Profile No results for input(s): CHOL, HDL, LDLCALC, TRIG, CHOLHDL, LDLDIRECT in the last 72 hours. Thyroid function studies No results for input(s): TSH, T4TOTAL, T3FREE, THYROIDAB in the last 72 hours.  Invalid input(s): FREET3 Anemia work up No results for input(s): VITAMINB12, FOLATE, FERRITIN, TIBC, IRON, RETICCTPCT in the last 72 hours. Urinalysis    Component Value Date/Time   COLORURINE YELLOW 11/21/2017 1810   APPEARANCEUR CLEAR 11/21/2017 1810   LABSPEC 1.017 11/21/2017 1810   PHURINE 5.0 11/21/2017 1810   GLUCOSEU NEGATIVE 11/21/2017 1810   HGBUR LARGE (A) 11/21/2017 1810   BILIRUBINUR NEGATIVE 11/21/2017 1810   KETONESUR NEGATIVE 11/21/2017 1810   PROTEINUR 30 (A) 11/21/2017 1810   UROBILINOGEN 1.0 12/29/2014 0816   NITRITE NEGATIVE 11/21/2017 1810   LEUKOCYTESUR NEGATIVE 11/21/2017 1810   Sepsis Labs Invalid input(s): PROCALCITONIN,  WBC,  LACTICIDVEN Microbiology No results found for this or any previous visit (from the past 240 hour(s)).  Time spent: 59min SIGNED:   [name], MD  [facility] 12/04/2017, 1:40 PM  If 7PM-7AM, please contact night-coverage [email or web address] Password [email or web address]

## 2017-12-04 NOTE — Progress Notes (Signed)
Patient received at 1545 alert and oriented x 2-3 . Oriented patient and son to room and call bell system. Patient and son reported understanding of admission process. Continue with plan of care.  Dakota [name]

## 2017-12-04 NOTE — Progress Notes (Signed)
Inpatient Rehabilitation-Admissions Coordinator   [facility] has received medical approval for admit to CIR today. AC has communicated the plan with pt, his family, floor RN, CM, and SW. Please call if questions.   Jhonnie Garner, OTR/L  Rehab Admissions Coordinator  [phone] 12/04/2017 2:47 PM'

## 2017-12-04 NOTE — Progress Notes (Signed)
Patient discharged to 4W-14 via bed with son at bedside. IV and condom cath left in place. Report called to 4W nurse.

## 2017-12-04 NOTE — Progress Notes (Signed)
Physical Medicine and Rehabilitation Consult  Reason for Consult: Functional deficits due to stroke.  Referring Physician: Dr. Erlinda Hong   HPI: Dakota [name] is a 84 y.o. male with history of HTN, CHB s/p PPM, GERD/Schatzki's ring, PAF- refused coumadin; who was admitted on 11/16/17 with left sided weakness, right gaze preference,  slurred speech and mental status changes.  CT head showed acute to subacute right basal ganglia infarct with concerns of distal R-MCA thrombus. CTA/P head done revealing R-MCA territory penumbra with moderate to severe  M3 branch irregularity. He underwent cerebral angio with revascularization of R-MCA M2 segment by Dr.Deveshwar.  Work up underway and Dr. Erlinda Hong felt that stroke embolic due to A fib. Therapy evaluations completed today and patient with significant limitations due to delirium, severe dysarthria, right gaze preference with left sided weakness. CIR recommended due to functional deficits.   Review of Systems  Unable to perform ROS: Mental acuity          Past Medical History:  Diagnosis Date  . Anxiety   . Back pain, chronic   . Complete heart block ([place]) 07/18/2014   [employer]tronic [place] model [id number] (serial number [id number])  singe lead PPM  . GERD (gastroesophageal reflux disease) 10/2002  . Hyperlipidemia 12/1994  . Hypertension   . Hypokalemia   . Lung collapse 03/1991   Fall  . Permanent atrial fibrillation ([name])    Refused coumadin therapy         Past Surgical History:  Procedure Laterality Date  . BACK SURGERY  11/91   with hardware fixation  . CARDIAC CATHETERIZATION  06/2004   30 % stenosis, EF normal  . ESOPHAGOGASTRODUODENOSCOPY N/A 01/06/2016   Procedure: ESOPHAGOGASTRODUODENOSCOPY (EGD);  Surgeon: Otis Brace, MD;  Location: [facility];  Service: Gastroenterology;  Laterality: N/A;  . EYE SURGERY     Retinal bubble surgery, cataract   . LEFT HEART CATHETERIZATION WITH CORONARY ANGIOGRAM N/A 07/18/2014    Procedure: LEFT HEART CATHETERIZATION WITH CORONARY ANGIOGRAM;  Surgeon: Jettie Booze, MD; [facility] [place], LAD mild dz, D1 80%, D2 OK, CFX system OK, RCA OK, PDA 100%, med rx  . PERMANENT PACEMAKER INSERTION N/A 07/18/2014   Procedure: PERMANENT PACEMAKER INSERTION;  Surgeon: Thompson Grayer, MD; Medtronic [place] model [id number] (serial number [id number])    . RADIOLOGY WITH ANESTHESIA N/A 11/16/2017   Procedure: RADIOLOGY WITH ANESTHESIA;  Surgeon: Luanne Bras, MD;  Location: [facility];  Service: Radiology;  Laterality: N/A;  . TEMPORARY PACEMAKER INSERTION N/A 07/18/2014   Procedure: TEMPORARY PACEMAKER INSERTION;  Surgeon: Peter M Martinique, MD;  Location: [facility] CATH LAB;  Service: Cardiovascular;  Laterality: N/A;         Family History  Problem Relation Age of Onset  . Pneumonia Mother   . Hip fracture Mother   . Cancer Father        splenic  . Heart disease Sister        Pacer placed  . Cancer Brother        throat and lung  . Cancer Brother        prostate, age 43  . Prostate cancer Brother   . Heart disease Son   . Stroke Neg Hx   . Colon cancer Neg Hx     Social History:  Married and is the caregiver for his wife who is bedbound?. Per reports that he quit smoking about 64 years ago. He has a 15.00 pack-year smoking history. His smokeless tobacco use includes chew. Per reports that he does  not drink alcohol or use drugs.          Allergies  Allergen Reactions  . Warfarin And Related Other (See Comments)    Lost a lot of weight and also experienced numbness  . Atorvastatin Other (See Comments)    REACTION: MUSCLE PAIN  . Ezetimibe Other (See Comments)    REACTION: MUSCLE PAIN  . Diazepam Other (See Comments)    UNKNOWN, per pt  . Penicillins Other (See Comments)    UNKNOWN, per pt         Medications Prior to Admission  Medication Sig Dispense Refill  . ALPRAZolam (XANAX) 0.5 MG tablet TAKE ONE-HALF TABLET BY MOUTH TWICE DAILY AS NEEDED FOR  ANXIETY (Patient taking differently: Take 0.25 mg by mouth 2 (two) times daily as needed for anxiety. TAKE ONE-HALF TABLET BY MOUTH TWICE DAILY AS NEEDED FOR ANXIETY) 60 tablet 0  . aspirin 81 MG tablet Take 81 mg by mouth 2 (two) times daily.     . calcium carbonate (TUMS - DOSED IN MG ELEMENTAL CALCIUM) 500 MG chewable tablet Chew 1 tablet by mouth daily as needed for indigestion.     . carvedilol (COREG) 25 MG tablet TAKE 1 TABLET BY MOUTH TWICE DAILY WITH  A  MEAL. 180 tablet 1  . docusate sodium (COLACE) 100 MG capsule Take 1 capsule (100 mg total) by mouth daily as needed for mild constipation.    . hydrochlorothiazide (MICROZIDE) 12.5 MG capsule TAKE ONE CAPSULE BY MOUTH ONCE DAILY 90 capsule 1  . Magnesium Hydroxide (MILK OF MAGNESIA PO) Take 1 tablet by mouth daily as needed.    . nitroGLYCERIN (NITROSTAT) 0.4 MG SL tablet Place 1 tablet (0.4 mg total) under the tongue every 5 (five) minutes as needed for chest pain. 25 tablet 3  . pantoprazole (PROTONIX) 40 MG tablet Take 1 tablet (40 mg total) by mouth daily. 30 tablet 0  . ranitidine (ZANTAC) 150 MG tablet TAKE 1 TABLET BY MOUTH ONCE DAILY 90 tablet 1    Home: Home Living Family/patient expects to be discharged to:: Private residence Living Arrangements: Spouse/significant other Available Help at Discharge: Family, Available 24 hours/day Type of Home: House Home Access: Ramped entrance Home Layout: One level Bathroom Shower/Tub: Gaffer, Chiropodist: Standard Home Equipment: Environmental consultant - 2 wheels, Transport chair, Bedside commode Additional Comments: pt was primary caregiver for wife who does not walk, pt would transfer her and family assists with bathing  Functional History: Prior Function Level of Independence: Independent Comments: very independent, 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 Pt has 4 kids who can assist Functional Status:  Mobility: Bed Mobility Overal bed mobility: Needs  Assistance Bed Mobility: Rolling, Sidelying to Sit Rolling: Max assist, +2 for safety/equipment Sidelying to sit: Max assist, +2 for physical assistance, HOB elevated General bed mobility comments: max assist to bend left knee, move LUE onto chest and roll to right, pt assisting with RUE. Assist to elevate trunk and bring legs off of bed Transfers Overall transfer level: Needs assistance Transfers: Sit to/from Stand, Stand Pivot Transfers Sit to Stand: Mod assist, +2 physical assistance Stand pivot transfers: Max assist, +2 physical assistance General transfer comment: pt stood from bed and chair with left knee blocked and pt activating bil quad and rising with cues to initiate and assist at belt and trunk for stability. Pt able to pivot to right from bed<>chair with assist for balance, stepping and sequence. Pt with blocking left knee and facilitation to  step and extend trunk.  Ambulation/Gait General Gait Details: unable  ADL: ADL Overall ADL's : Needs assistance/impaired Eating/Feeding: NPO Eating/Feeding Details (indicate cue type and reason): dentures removed. pt with only bottom plate and son uncertain where the top plate is located Upper Body Bathing: Maximal assistance Lower Body Bathing: Total assistance Upper Body Dressing : Maximal assistance Lower Body Dressing: Total assistance Toilet Transfer: +2 for physical assistance, Maximal assistance Toilet Transfer Details (indicate cue type and reason): requires (A) to shift weight toward chair and bd General ADL Comments: simulated OOB to chair. Pt positioned in chair and RN arriving to place patient back in bed for MRI. pt transfered back to bed surface. Encouragement for staff to get patient OOB for all meal  Cognition: Cognition Overall Cognitive Status: Impaired/Different from baseline Arousal/Alertness: Awake/alert Orientation Level: Oriented to person, Disoriented to situation, Disoriented to time, Disoriented to  place Attention: Focused, Sustained Focused Attention: Appears intact Sustained Attention: Impaired Memory: Impaired Memory Impairment: Decreased short term memory Decreased Short Term Memory: Verbal basic Awareness: Impaired Awareness Impairment: Intellectual impairment Problem Solving: Impaired Problem Solving Impairment: Verbal basic, Functional basic Behaviors: Restless Safety/Judgment: Impaired Cognition Arousal/Alertness: Awake/alert Behavior During Therapy: Restless Overall Cognitive Status: Impaired/Different from baseline Area of Impairment: Orientation, Attention, Memory, Following commands, Safety/judgement, Awareness, Problem solving Orientation Level: Disoriented to, Time, Place Current Attention Level: Focused Memory: Decreased short-term memory Following Commands: Follows one step commands inconsistently Safety/Judgement: Decreased awareness of safety, Decreased awareness of deficits Problem Solving: Slow processing, Decreased initiation, Difficulty sequencing, Requires verbal cues, Requires tactile cues General Comments: pt stating he is in [place], hospital with spot on his brain. Pt stating year as 2010 but able to state correct age, states month as October, right gaze preference  Blood pressure 123/66, pulse 78, temperature 99.2 F (37.3 C), resp. rate 20, height 5\' 8"  (1.727 m), weight 87.5 kg (192 lb 14.4 oz), SpO2 100 %. Physical Exam  Constitutional: No distress.  HENT:  Head: Normocephalic.  Eyes: Pupils are equal, round, and reactive to light.  Neck: Normal range of motion.  Cardiovascular:  IRR IRR  Respiratory: Effort normal.  Neurological:  Confused. Alert to name. Can follow some simple commands. Left inattention and left hemiparesis. Minimal reaction to pain left arm or leg.   Skin: Skin is warm.    LabResultsLast24Hours       Results for orders placed or performed during the hospital encounter of 11/16/17 (from the past 24 hour(s))   Glucose, capillary     Status: Abnormal   Collection Time: 11/16/17  6:25 PM  Result Value Ref Range   Glucose-Capillary 173 (H) 70 - 99 mg/dL  MRSA PCR Screening     Status: None   Collection Time: 11/16/17  6:59 PM  Result Value Ref Range   MRSA by PCR NEGATIVE NEGATIVE  Hemoglobin A1c     Status: Abnormal   Collection Time: 11/17/17  5:00 AM  Result Value Ref Range   Hgb A1c MFr Bld 6.1 (H) 4.8 - 5.6 %   Mean Plasma Glucose 128.37 mg/dL  Lipid panel     Status: Abnormal   Collection Time: 11/17/17  5:00 AM  Result Value Ref Range   Cholesterol 151 0 - 200 mg/dL   Triglycerides 61 <150 mg/dL   HDL 37 (L) >40 mg/dL   Total CHOL/HDL Ratio 4.1 RATIO   VLDL 12 0 - 40 mg/dL   LDL Cholesterol 102 (H) 0 - 99 mg/dL  CBC with Differential/Platelet  Status: Abnormal   Collection Time: 11/17/17  5:00 AM  Result Value Ref Range   WBC 15.1 (H) 4.0 - 10.5 K/uL   RBC 4.90 4.22 - 5.81 MIL/uL   Hemoglobin 14.5 13.0 - 17.0 g/dL   HCT 44.7 39.0 - 52.0 %   MCV 91.2 78.0 - 100.0 fL   MCH 29.6 26.0 - 34.0 pg   MCHC 32.4 30.0 - 36.0 g/dL   RDW 13.2 11.5 - 15.5 %   Platelets 196 150 - 400 K/uL   Neutrophils Relative % 84 %   Neutro Abs 12.8 (H) 1.7 - 7.7 K/uL   Lymphocytes Relative 10 %   Lymphs Abs 1.5 0.7 - 4.0 K/uL   Monocytes Relative 5 %   Monocytes Absolute 0.7 0.1 - 1.0 K/uL   Eosinophils Relative 0 %   Eosinophils Absolute 0.0 0.0 - 0.7 K/uL   Basophils Relative 0 %   Basophils Absolute 0.0 0.0 - 0.1 K/uL   Immature Granulocytes 1 %   Abs Immature Granulocytes 0.1 0.0 - 0.1 K/uL  Basic metabolic panel     Status: Abnormal   Collection Time: 11/17/17  5:00 AM  Result Value Ref Range   Sodium 139 135 - 145 mmol/L   Potassium 4.4 3.5 - 5.1 mmol/L   Chloride 109 98 - 111 mmol/L   CO2 22 22 - 32 mmol/L   Glucose, Bld 184 (H) 70 - 99 mg/dL   BUN 24 (H) 8 - 23 mg/dL   Creatinine, Ser 1.26 (H) 0.61 - 1.24 mg/dL   Calcium 8.5 (L) 8.9  - 10.3 mg/dL   GFR calc non Af Amer 50 (L) >60 mL/min   GFR calc Af Amer 58 (L) >60 mL/min   Anion gap 8 5 - 15      ImagingResults(Last48hours)  Ct Angio Head W Or Wo Contrast  Result Date: 11/16/2017 CLINICAL DATA:  83 year old male code stroke. Altered mental status. Evidence of right basal ganglia/MCA lacunar and possible hyperdense right MCA on noncontrast head CT 1309 hours today. EXAM: CT ANGIOGRAPHY HEAD AND NECK CT PERFUSION BRAIN TECHNIQUE: Multidetector CT imaging of the head and neck was performed using the standard protocol during bolus administration of intravenous contrast. Multiplanar CT image reconstructions and MIPs were obtained to evaluate the vascular anatomy. Carotid stenosis measurements (when applicable) are obtained utilizing NASCET criteria, using the distal internal carotid diameter as the denominator. Multiphase CT imaging of the brain was performed following IV bolus contrast injection. Subsequent parametric perfusion maps were calculated using RAPID software. CONTRAST:  46mL ISOVUE-370 IOPAMIDOL (ISOVUE-370) INJECTION 76% COMPARISON:  Head CT without contrast 1309 hours today. FINDINGS: CT Brain Perfusion Findings: ASPECTS approximately 7 on the earlier noncontrast CT. CBF (<30%) Volume: 0 milliliters (CBF less than 38% the techs 5 milliliters along the posterior operculum). Perfusion (Tmax>6.0s) volume: 41 milliliters Mismatch Volume: 41 milliliters Infarction Location:Posterior right MCA territory. Other findings:  Some motion artifact noted CTA NECK Skeleton: Absent dentition. No acute osseous abnormality identified. Upper chest: Left chest cardiac pacemaker type device. Mild dependent pulmonary atelectasis. No superior mediastinal lymphadenopathy. Other neck: Negative.  No neck mass or lymphadenopathy. Aortic arch: 3 vessel arch configuration. Mild to moderate arch atherosclerosis. Right carotid system: No brachiocephalic or right CCA origin stenosis. Minimal  right CCA plaque including at the right carotid bifurcation. Mild soft plaque at the right ICA origin. No cervical right ICA stenosis. Left carotid system: No left CCA origin stenosis. Minimal left CCA plaque including at the left carotid bifurcation.  Mild proximal left ICA plaque with no stenosis. Vertebral arteries: No proximal right subclavian artery stenosis. Normal right vertebral artery origin. Patent right vertebral artery to the skull base without stenosis. No proximal left subclavian artery stenosis despite mild plaque. Soft and calcified plaque at the left vertebral artery origin and proximal V1 segment (series 10, image 29), with only mild stenosis. Codominant vertebral arteries, the left is patent to the skull base with mild V3 segment plaque but no stenosis. CTA HEAD Posterior circulation: Codominant distal vertebral arteries are patent without stenosis. Patent PICA origins and vertebrobasilar junction. Patent basilar artery without stenosis. Normal SCA and left PCA origin. Tortuous right P1 segment and right posterior communicating artery. The left Posterior communicating artery is diminutive or absent. Bilateral PCA branches are within normal limits. Anterior circulation: Both ICA siphons are patent. There is only minor siphon calcified plaque, greater on the left. Normal ophthalmic and right posterior communicating artery origins. Patent carotid termini. Normal MCA and ACA origins. Mildly tortuous A1 segments. Anterior communicating artery and bilateral ACA branches are within normal limits. Left MCA M1 segment is tortuous. Left MCA bifurcation is patent without stenosis. Left MCA branches are within normal limits. Right MCA M1 segment is tortuous but patent without stenosis. Right MCA bifurcation is patent. No right MCA M2 branch occlusion identified. Areas of moderate to severe right M3 branch irregularity are noted on series 14, image 15. Venous sinuses: Patent. Anatomic variants: None. Review of  the MIP images confirms the above findings IMPRESSION: 1. Negative for large vessel occlusion. No discrete right MCA branch occlusion is identified, but moderate to severe right M3 branch irregularity is noted. 2. CT perfusion suggests posterior right MCA territory penumbra, but does not detect core infarct with the standard parameters. However, a small 5 mL infarct is suggested with more sensitive CBF (<38%) parameter, located along the posterior operculum. Therefore, it is possible that the right lentiform finding is chronic. 3. Mild for age atherosclerosis in the head and neck with no hemodynamically significant arterial stenosis identified outside of the right M3 findings in #1. 4.  Study discussed by telephone on 11/16/2017 at 15:19 to Dr. Rory Percy. Electronically Signed   By: Genevie Ann M.D.   On: 11/16/2017 15:20   Ct Head Wo Contrast  Result Date: 11/16/2017 CLINICAL DATA:  Altered mental status. EXAM: CT HEAD WITHOUT CONTRAST TECHNIQUE: Contiguous axial images were obtained from the base of the skull through the vertex without intravenous contrast. COMPARISON:  None. FINDINGS: Brain: Hypodensity within the right basal ganglia, external capsule, and posterior operculum. No evidence of hemorrhage, hydrocephalus, extra-axial collection or mass lesion/mass effect. Moderate age related cerebral atrophy. Scattered moderate periventricular and subcortical white matter hypodensities are nonspecific, but favored to reflect chronic microvascular ischemic changes. Vascular: Right distal MCA branch hyperdensity near the operculum likely reflecting thrombus (series 5, image 46). Atherosclerotic vascular calcification of the carotid siphons. Skull: Negative for fracture or focal lesion. Sinuses/Orbits: No acute finding. Other: None. IMPRESSION: 1. Acute to subacute infarct in the right basal ganglia, external capsule, and posterior operculum. Right distal MCA branch hyperdensity near the operculum likely reflects  thrombus. These results were called by telephone at the time of interpretation on 11/16/2017 at 1:47 pm to Dr. Carmin Muskrat , who verbally acknowledged these results. Electronically Signed   By: Titus Dubin M.D.   On: 11/16/2017 13:49   Ct Angio Neck W Or Wo Contrast  Result Date: 11/16/2017 CLINICAL DATA:  84 year old male code stroke. Altered mental status.  Evidence of right basal ganglia/MCA lacunar and possible hyperdense right MCA on noncontrast head CT 1309 hours today. EXAM: CT ANGIOGRAPHY HEAD AND NECK CT PERFUSION BRAIN TECHNIQUE: Multidetector CT imaging of the head and neck was performed using the standard protocol during bolus administration of intravenous contrast. Multiplanar CT image reconstructions and MIPs were obtained to evaluate the vascular anatomy. Carotid stenosis measurements (when applicable) are obtained utilizing NASCET criteria, using the distal internal carotid diameter as the denominator. Multiphase CT imaging of the brain was performed following IV bolus contrast injection. Subsequent parametric perfusion maps were calculated using RAPID software. CONTRAST:  23mL ISOVUE-370 IOPAMIDOL (ISOVUE-370) INJECTION 76% COMPARISON:  Head CT without contrast 1309 hours today. FINDINGS: CT Brain Perfusion Findings: ASPECTS approximately 7 on the earlier noncontrast CT. CBF (<30%) Volume: 0 milliliters (CBF less than 38% the techs 5 milliliters along the posterior operculum). Perfusion (Tmax>6.0s) volume: 41 milliliters Mismatch Volume: 41 milliliters Infarction Location:Posterior right MCA territory. Other findings:  Some motion artifact noted CTA NECK Skeleton: Absent dentition. No acute osseous abnormality identified. Upper chest: Left chest cardiac pacemaker type device. Mild dependent pulmonary atelectasis. No superior mediastinal lymphadenopathy. Other neck: Negative.  No neck mass or lymphadenopathy. Aortic arch: 3 vessel arch configuration. Mild to moderate arch atherosclerosis.  Right carotid system: No brachiocephalic or right CCA origin stenosis. Minimal right CCA plaque including at the right carotid bifurcation. Mild soft plaque at the right ICA origin. No cervical right ICA stenosis. Left carotid system: No left CCA origin stenosis. Minimal left CCA plaque including at the left carotid bifurcation. Mild proximal left ICA plaque with no stenosis. Vertebral arteries: No proximal right subclavian artery stenosis. Normal right vertebral artery origin. Patent right vertebral artery to the skull base without stenosis. No proximal left subclavian artery stenosis despite mild plaque. Soft and calcified plaque at the left vertebral artery origin and proximal V1 segment (series 10, image 29), with only mild stenosis. Codominant vertebral arteries, the left is patent to the skull base with mild V3 segment plaque but no stenosis. CTA HEAD Posterior circulation: Codominant distal vertebral arteries are patent without stenosis. Patent PICA origins and vertebrobasilar junction. Patent basilar artery without stenosis. Normal SCA and left PCA origin. Tortuous right P1 segment and right posterior communicating artery. The left Posterior communicating artery is diminutive or absent. Bilateral PCA branches are within normal limits. Anterior circulation: Both ICA siphons are patent. There is only minor siphon calcified plaque, greater on the left. Normal ophthalmic and right posterior communicating artery origins. Patent carotid termini. Normal MCA and ACA origins. Mildly tortuous A1 segments. Anterior communicating artery and bilateral ACA branches are within normal limits. Left MCA M1 segment is tortuous. Left MCA bifurcation is patent without stenosis. Left MCA branches are within normal limits. Right MCA M1 segment is tortuous but patent without stenosis. Right MCA bifurcation is patent. No right MCA M2 branch occlusion identified. Areas of moderate to severe right M3 branch irregularity are noted on  series 14, image 15. Venous sinuses: Patent. Anatomic variants: None. Review of the MIP images confirms the above findings IMPRESSION: 1. Negative for large vessel occlusion. No discrete right MCA branch occlusion is identified, but moderate to severe right M3 branch irregularity is noted. 2. CT perfusion suggests posterior right MCA territory penumbra, but does not detect core infarct with the standard parameters. However, a small 5 mL infarct is suggested with more sensitive CBF (<38%) parameter, located along the posterior operculum. Therefore, it is possible that the right lentiform finding is chronic.  3. Mild for age atherosclerosis in the head and neck with no hemodynamically significant arterial stenosis identified outside of the right M3 findings in #1. 4.  Study discussed by telephone on 11/16/2017 at 15:19 to Dr. Rory Percy. Electronically Signed   By: Genevie Ann M.D.   On: 11/16/2017 15:20   Ct Cerebral Perfusion W Contrast  Result Date: 11/16/2017 CLINICAL DATA:  84 year old male code stroke. Altered mental status. Evidence of right basal ganglia/MCA lacunar and possible hyperdense right MCA on noncontrast head CT 1309 hours today. EXAM: CT ANGIOGRAPHY HEAD AND NECK CT PERFUSION BRAIN TECHNIQUE: Multidetector CT imaging of the head and neck was performed using the standard protocol during bolus administration of intravenous contrast. Multiplanar CT image reconstructions and MIPs were obtained to evaluate the vascular anatomy. Carotid stenosis measurements (when applicable) are obtained utilizing NASCET criteria, using the distal internal carotid diameter as the denominator. Multiphase CT imaging of the brain was performed following IV bolus contrast injection. Subsequent parametric perfusion maps were calculated using RAPID software. CONTRAST:  21mL ISOVUE-370 IOPAMIDOL (ISOVUE-370) INJECTION 76% COMPARISON:  Head CT without contrast 1309 hours today. FINDINGS: CT Brain Perfusion Findings: ASPECTS  approximately 7 on the earlier noncontrast CT. CBF (<30%) Volume: 0 milliliters (CBF less than 38% the techs 5 milliliters along the posterior operculum). Perfusion (Tmax>6.0s) volume: 41 milliliters Mismatch Volume: 41 milliliters Infarction Location:Posterior right MCA territory. Other findings:  Some motion artifact noted CTA NECK Skeleton: Absent dentition. No acute osseous abnormality identified. Upper chest: Left chest cardiac pacemaker type device. Mild dependent pulmonary atelectasis. No superior mediastinal lymphadenopathy. Other neck: Negative.  No neck mass or lymphadenopathy. Aortic arch: 3 vessel arch configuration. Mild to moderate arch atherosclerosis. Right carotid system: No brachiocephalic or right CCA origin stenosis. Minimal right CCA plaque including at the right carotid bifurcation. Mild soft plaque at the right ICA origin. No cervical right ICA stenosis. Left carotid system: No left CCA origin stenosis. Minimal left CCA plaque including at the left carotid bifurcation. Mild proximal left ICA plaque with no stenosis. Vertebral arteries: No proximal right subclavian artery stenosis. Normal right vertebral artery origin. Patent right vertebral artery to the skull base without stenosis. No proximal left subclavian artery stenosis despite mild plaque. Soft and calcified plaque at the left vertebral artery origin and proximal V1 segment (series 10, image 29), with only mild stenosis. Codominant vertebral arteries, the left is patent to the skull base with mild V3 segment plaque but no stenosis. CTA HEAD Posterior circulation: Codominant distal vertebral arteries are patent without stenosis. Patent PICA origins and vertebrobasilar junction. Patent basilar artery without stenosis. Normal SCA and left PCA origin. Tortuous right P1 segment and right posterior communicating artery. The left Posterior communicating artery is diminutive or absent. Bilateral PCA branches are within normal limits. Anterior  circulation: Both ICA siphons are patent. There is only minor siphon calcified plaque, greater on the left. Normal ophthalmic and right posterior communicating artery origins. Patent carotid termini. Normal MCA and ACA origins. Mildly tortuous A1 segments. Anterior communicating artery and bilateral ACA branches are within normal limits. Left MCA M1 segment is tortuous. Left MCA bifurcation is patent without stenosis. Left MCA branches are within normal limits. Right MCA M1 segment is tortuous but patent without stenosis. Right MCA bifurcation is patent. No right MCA M2 branch occlusion identified. Areas of moderate to severe right M3 branch irregularity are noted on series 14, image 15. Venous sinuses: Patent. Anatomic variants: None. Review of the MIP images confirms the above findings IMPRESSION:  1. Negative for large vessel occlusion. No discrete right MCA branch occlusion is identified, but moderate to severe right M3 branch irregularity is noted. 2. CT perfusion suggests posterior right MCA territory penumbra, but does not detect core infarct with the standard parameters. However, a small 5 mL infarct is suggested with more sensitive CBF (<38%) parameter, located along the posterior operculum. Therefore, it is possible that the right lentiform finding is chronic. 3. Mild for age atherosclerosis in the head and neck with no hemodynamically significant arterial stenosis identified outside of the right M3 findings in #1. 4.  Study discussed by telephone on 11/16/2017 at 15:19 to Dr. Rory Percy. Electronically Signed   By: Genevie Ann M.D.   On: 11/16/2017 15:20   Dg Chest Port 1 View  Result Date: 11/16/2017 CLINICAL DATA:  Encounter for AMS and SOB EXAM: PORTABLE CHEST - 1 VIEW COMPARISON:  01/06/2016 FINDINGS: Relatively low lung volumes with crowding of bronchovascular structures. No confluent airspace infiltrate or overt edema. Heart size upper limits normal for technique. No effusion or pneumothorax. Stable left  subclavian single lead transvenous pacemaker extending towards the right ventricular apex. Lumbar fixation hardware partially visualized. IMPRESSION: No acute cardiopulmonary disease. Electronically Signed   By: Lucrezia Europe M.D.   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 Does the need for close, 24 hr/day medical supervision in concert with the patient's rehab needs make it unreasonable for this patient to be served in a less intensive setting? Yes 2. Co-Morbidities requiring supervision/potential complications: afib, HTN, 3. Due to bladder management, bowel management, safety, skin/wound care, disease management, medication administration, pain management and patient education, does the patient require 24 hr/day rehab nursing? Yes 4. Does the patient require coordinated care of a physician, rehab nurse, PT (1-2 hrs/day, 5 days/week), OT (1-2 hrs/day, 5 days/week) and SLP (1-2 hrs/day, 5 days/week) to address physical and functional deficits in the context of the above medical diagnosis(es)? Yes Addressing deficits in the following areas: balance, endurance, locomotion, strength, transferring, bowel/bladder control, bathing, dressing, feeding, grooming, toileting, cognition, swallowing and psychosocial support 5. Can the patient actively participate in an intensive therapy program of at least 3 hrs of therapy per day at least 5 days per week? Yes 6. The potential for patient to make measurable gains while on inpatient rehab is good 7. Anticipated functional outcomes upon discharge from inpatient rehab are supervision and min assist  with PT, supervision and min assist with OT, supervision with SLP. 8. Estimated rehab length of stay to reach the above functional goals is: 19-24 days 9. Anticipated D/C setting: Home 10. Anticipated post D/C treatments: [place] therapy 11. Overall Rehab/Functional Prognosis:  good  RECOMMENDATIONS: This patient's condition is appropriate for continued rehabilitative care in the following setting: CIR Patient has agreed to participate in recommended program. N/A Note that insurance prior authorization may be required for reimbursement for recommended care.  Comment: Rehab Admissions Coordinator to follow up.  Thanks,  Meredith Staggers, MD, Mellody Drown  I have personally performed a face to face diagnostic evaluation of this patient. Additionally, I have reviewed and concur with the physician assistant's documentation above.    Bary Leriche, PA-C 11/17/2017      Revision History                             Routing History

## 2017-12-04 NOTE — Progress Notes (Signed)
PMR Admission Coordinator Pre-Admission Assessment  Patient: Dakota [name] is an 84 y.o., male MRN: [id number] DOB: Jul 08, 1928 Height: 5\' 7"  (170.2 cm) Weight: 78.1 kg (172 lb 2.9 oz)                                                                                                                                                  Insurance Information HMO:     PPO:      PCP:      IPA:      80/20:      OTHER: no HMO PRIMARY: Medicare a and b      Policy#: [id number]      Subscriber: pt Benefits:  Phone #: passport one online     Name: 12/01/2017 Eff. Date: 08/30/1992     Deduct: $1364      Out of Pocket Max: none      Life Max: none CIR: 100%      SNF: 20 full days Outpatient: 80%     Co-Pay: 20% Home Health: 100%      Co-Pay: none DME: 80%     Co-Pay: 20% Providers: pt choice  SECONDARY: [employer]      Policy#: [id number]      Subscriber: pt  Medicaid Application Date:       Case Manager:  Disability Application Date:       Case Worker:   Emergency [place]    Name Relation Home Work [place] Daughter [phone]     Denim, Kalmbach [phone]  [phone]   Dorsie, Burich   [phone]   Cheo, Selvey   [phone]     Current Medical History  Patient Admitting Diagnosis: right CVA; GI Bleed  History of Present Illness: Dakota [name] is an 84 year old male with history of HTN, CHB s/p PPM, Afib, GERD/Schatzki's ring;who was admitted on 11/16/2017 with left-sided weakness, right gaze preference, slurred speech and mental status changes. CT of head done showing right basal ganglia infarct with concerns of distal right MCA thrombus. She underwent cerebral angiogram with revascularization of right MCA M2 segment by Dr. Estanislado Pandy.Patient continued to have issues with confusion and delirium and repeat CT head on 7/19 showed propagation of acute right MCA territory infarct involving right insula and  right temporoparietal lobe and dense right insular MCA consistent with thromboembolism.   Dr.Xufelt that stroke was embolic due to A. fib and patient to start Eliquis on hospital day 5. Patient  had issues with delirium sleep-wake sleep-wake cycle disruption agitation despite Seroquel. Depakote was added at discharge. Patient was cleared to start Eliquis at admission on 11/21/17. Morning dose Seroquel was DC'd to prevent daytime sedation and sleep-wake cycle has been monitored. Due to evidence of progressive leukocytosis;chest x-ray and UA/UCS were  ordered to rule out infection as cause of delirium. Chest x-ray was negative for acute changes. UA showed evidence of UTI therefore Bactrim was added for treatment on 726. IV fluids were changed to at bedtime and nutritional supplements were ordered to help with p.o. intake. Patient has had fluctuating mental status with extremely poor p.o. intake therefore calorie count was initiated and concerns about need for alternate source of nutrition was discussed briefly with family.   Follow up labs past admission showed persistent leucocytosis with mild drop in H/H.He has had issues with constipation treated with suppository and enema on p.m. of 7/25. Frequent stooling was reported and as well as concerns of tarry stools. Stool guaiac was checked and noted to be positive.Later that afternoon patient reported abdominal lower abdominal pain and was found to have maroon appearing stools. Stat CBC, Lytesand type and cross were ordered for workup. He was noted to be hypotensive with SBP in 80's. He was treated withfluidboluswith improvement in SBP to 90-100. He was noted to be lethargic and poorly responsive despiteIV fluids.Eliquis was discontinued. Dr. Leandrew Koyanagi ist was consulted for assistance and transfer. Dr. Oletta Lamas was consulted to evaluated patient for work up of GIB. Patient was in need of closer monitoringand was discharged to a  stepdown unit on 11/24/2017  EGD performed 7/29 revealed duodenal ulcer as well as gastritis and esophagitis. Placed on Protonix and re bled again when anticoagulation restarted. Now placed on hold. Dr. Oletta Lamas, GI. Feels patient should remain off anticoagulation for at least 2 to 3 weeks if not indefinitely.  Total: 8 NIHSS  Past Medical History      Past Medical History:  Diagnosis Date  . Anxiety   . Back pain, chronic   . Complete heart block ([place]) 07/18/2014   Med[employer] [place] model [id number] (serial number [id number])  singe lead PPM  . Esophageal obstruction due to food impaction 2017  . GERD (gastroesophageal reflux disease) 10/2002  . Hyperlipidemia 12/1994  . Hypertension   . Hypokalemia   . Lung collapse 03/1991   Fall  . Permanent atrial fibrillation ([name])    Refused coumadin therapy    Family History  family history includes Cancer in his brother, brother, and father; Heart disease in his sister and son; Hip fracture in his mother; Pneumonia in his mother; Prostate cancer in his brother.  Prior Rehab/Hospitalizations:  Has the patient had major surgery during 100 days prior to admission? No  Current Medications   Current Facility-Administered Medications:  .  acetaminophen (TYLENOL) tablet 650 mg, 650 mg, Oral, Q6H PRN, Opyd, Ilene Qua, MD, 650 mg at 12/01/17 2100 .  chlorhexidine (PERIDEX) 0.12 % solution 15 mL, 15 mL, Mouth Rinse, BID, Patrecia Pour, MD, 15 mL at 12/04/17 0829 .  Chlorhexidine Gluconate Cloth 2 % PADS 6 each, 6 each, Topical, Daily, Juanito Doom, MD, 6 each at 12/03/17 0809 .  divalproex (DEPAKOTE SPRINKLE) capsule 250 mg, 250 mg, Oral, Q8H, Cherene Altes, MD, 250 mg at 12/04/17 1323 .  HYDROcodone-acetaminophen (NORCO/VICODIN) 5-325 MG per tablet 1-2 tablet, 1-2 tablet, Oral, Q6H PRN, Opyd, Timothy S, MD .  MEDLINE mouth rinse, 15 mL, Mouth Rinse, q12n4p, Vance Gather B, MD, 15 mL at 12/04/17 1213 .  nicotine (NICODERM CQ -  dosed in mg/24 hr) patch 7 mg, 7 mg, Transdermal, Daily, Simonne Maffucci B, MD, 7 mg at 12/04/17 0839 .  pantoprazole (PROTONIX) EC tablet 40 mg, 40 mg, Oral, BID AC, Cherene Altes, MD, 40 mg at  12/04/17 0829 .  QUEtiapine (SEROQUEL) tablet 12.5 mg, 12.5 mg, Oral, QHS, Cherene Altes, MD, 12.5 mg at 12/03/17 2040 .  sodium chloride flush (NS) 0.9 % injection 10-40 mL, 10-40 mL, Intracatheter, Q12H, McQuaid, Douglas B, MD, 10 mL at 12/03/17 2041 .  sodium chloride flush (NS) 0.9 % injection 10-40 mL, 10-40 mL, Intracatheter, PRN, McQuaid, Douglas B, MD .  sodium chloride flush (NS) 0.9 % injection 3 mL, 3 mL, Intravenous, Q12H, Patrecia Pour, MD, 3 mL at 12/04/17 0840  Patients Current Diet:       Diet Order           DIET - DYS 1 Room service appropriate? 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 No confusion pta; typically gets confused when hospitalized  Home Assistive Devices / [place] Devices/Equipment: Environmental consultant (specify type) Home Equipment: Walker - 2 wheels, Transport chair, Bedside commode  Prior Device Use: Indicate devices/aids used by the patient prior to current illness, exacerbation or injury? None of the above  Prior Functional Level Prior Function Level of Independence: Independent Gait / Transfers Assistance Needed: independent without AD ADL's / Homemaking Assistance Needed: INdpendent without AD Communication / Swallowing Assistance Needed: intact Comments: very independent, 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 Pt has 4 kids who can  assist  Self Care: Did the patient need help bathing, dressing, using the toilet or eating?  Independent  Indoor Mobility: Did the patient need assistance with walking from room to room (with or without device)? Independent  Stairs: Did the patient need assistance with internal or external stairs (with or without device)? Independent  Functional Cognition: Did the patient need help planning regular tasks such as shopping or remembering to take medications? Independent  Current Functional Level Cognition  Overall Cognitive Status: Impaired/Different from baseline Current Attention Level: Sustained Orientation Level: Oriented to person, Oriented to place, Disoriented to time, Disoriented to situation Following Commands: Follows one step commands with increased time Safety/Judgement: Decreased awareness of safety, Decreased awareness of deficits General Comments: When asked who was in the room with pt, pt stated "my dad", in reference to his son.    Extremity Assessment (includes Sensation/Coordination)  Upper Extremity Assessment: LUE deficits/detail LUE Deficits / Details: worked on wide range reaching with L UE in supported sitting LUE Sensation: (impaired) LUE Coordination: decreased fine motor, decreased gross motor  Lower Extremity Assessment: LLE deficits/detail RLE Deficits / Details: generalized weakness LLE Deficits / Details: spontaneous movement of L LE, difficulty with purposeful movement LLE Sensation: (impaired ) LLE Coordination: decreased fine motor, decreased gross motor(poor proprioception )    ADLs  Overall ADL's : Needs assistance/impaired Eating/Feeding: Maximal assistance, Sitting Eating/Feeding Details (indicate cue type and reason): requires sips with spoon Grooming: Brushing hair, Standing, Minimal assistance Grooming Details (indicate cue type and reason): stood with R UE stabilizing on sink and min assist for standing balance Upper Body Bathing:  Maximal assistance Lower Body Bathing: Total assistance Upper Body Dressing : Maximal assistance Lower Body Dressing: Total assistance, Bed level Lower Body Dressing Details (indicate cue type and reason): sock Toilet Transfer: +2  for physical assistance, Maximal assistance Toileting- Clothing Manipulation and Hygiene: Total assistance, Sit to/from stand, +2 for physical assistance Toileting - Clothing Manipulation Details (indicate cue type and reason): incontinenet of apparent large melanic stool; nsg aware Functional mobility during ADLs: Maximal assistance, +2 for physical assistance General ADL Comments: Worked on sitting balance, activation of trunk through reaching activities at EOB, pt needing min to mod assist with balance due to L side leaning    Mobility  Overal bed mobility: Needs Assistance Bed Mobility: Supine to Sit Rolling: Min guard Sidelying to sit: +2 for physical assistance, Min assist, HOB elevated Supine to sit: Total assist, +2 for physical assistance General bed mobility comments: light min A with cues for technique. Increased time and use of bed rails with HOB elevated    Transfers  Overall transfer level: Needs assistance Equipment used: 2 person hand held assist Transfer via [place]: [place] Transfers: Sit to/from Stand, Risk manager Sit to Stand: +2 physical assistance, Mod assist, Min assist Stand pivot transfers: +2 physical assistance, Mod assist General transfer comment: Assist required to rise and steady. Mod A at times due to L Lateral lean. Able to perform sit<>stand 4x for peri care and ADLs.    Ambulation / Gait / Stairs / Wheelchair Mobility  Ambulation/Gait General Gait Details: unable    Posture / Balance Dynamic Sitting Balance Sitting balance - Comments: assist ranging from min to mod assist during dynamic activities, pt reports he is aware he is leaning L, but needs cues to correct Balance Overall balance assessment:  Needs assistance Sitting-balance support: Single extremity supported, Feet supported Sitting balance-Leahy Scale: Poor Sitting balance - Comments: assist ranging from min to mod assist during dynamic activities, pt reports he is aware he is leaning L, but needs cues to correct Postural control: Left lateral lean Standing balance support: Bilateral upper extremity supported Standing balance-Leahy Scale: Poor Standing balance comment: +2 moderate assistance with hands held, min assist and one hand support at sink    Special needs/care consideration BiPAP/CPAP n/a CPM  N/a Continuous Drip IV: NA Dialysis  N/a Life Vest  N/a Oxygen: NA on RA  Special Bed: NA Trach Size  N/a Wound Vac n/a Skin: Abrasion to back, leg, arm                               Location (Right; bilateral)  **also noted is skin tear on Left medial back.  Bowel mgmt:12/02/17; continent Bladder mgmt:external catheter in place Diabetic mgmt yes Patient with some delirium throughout hospitalization. Pt was not confused pta. Sons typically at bedside during the day, and need a lot of education on managing patient;s environment to decrease his confusion    Previous Home Environment Living Arrangements: Spouse/significant other  Lives With: Spouse Available Help at Discharge: Family, Available 24 hours/day Type of Home: House Home Layout: One level Home Access: Ramped entrance Bathroom Shower/Tub: Gaffer, Chiropodist: Standard Bathroom Accessibility: Yes How Accessible: Accessible via walker [place]: No Additional Comments: pt was primary caregiver for wife who does not walk, pt would transfer her and family assists with bathing  Discharge Living Setting Plans for Discharge Living Setting: Patient's home, Lives with (comment) Type of Home at Discharge: House Discharge Home Layout: One level Discharge Home Access: [place] entrance Discharge Bathroom Shower/Tub: Walk-in  shower Discharge Bathroom Toilet: Standard Discharge Bathroom Accessibility: Yes How Accessible: Accessible via walker Does the patient  have any problems obtaining your medications?: No  Social/Family/Support Systems Patient Roles: Spouse, Caregiver, Parent Contact Information: [place] People and [place] per Herbie [place] Anticipated Caregiver: Family currently sharing responsibilities for care of their mother and plan to continue this for pt, too, upon d/c. Anticipated Caregiver's Contact Information: see above Ability/Limitations of Caregiver: Laakea retired;  other 3 children are working full-time and one with new job and less daytime availability.   Caregiver Availability: 24/7 Discharge Plan Discussed with Primary Caregiver: Yes Is Caregiver In Agreement with Plan?: Yes Does Caregiver/Family have Issues with Lodging/Transportation while Pt is in Rehab?: No   Goals/Additional Needs Patient/Family Goal for Rehab: supervision to min assist with PT, OT, and SLP Expected length of stay: 21-24 days Special Service Needs: patietn alert and orietned pta; no confusion Pt/Family Agrees to Admission and willing to participate: Yes Program Orientation Provided & Reviewed with Pt/Caregiver Including Roles  & Responsibilities: Yes  Decrease burden of Care through IP rehab admission: n/a  Possible need for SNF placement upon discharge:Patient son, Arion, states family would take patient home no matter what. adamantly refuses SNF  Patient Condition: The patient's medical and functional status has changed since his Rehab consult on 11/17/17 and subsequent readmission to acute hospital on 11/24/2017. See history of present illness for medical update. Functional changes are patient is overall mod to max assist with mobility and adls. Patient's medical and functional status has been discussed with the Rehabilitation physician and patient remains appropriate for inpatient rehabilitation admission. We will admit  patient to inpatient rehab today (12/04/17).  Preadmission Screen Completed By:  Danne Baxter, Admissions Coordinator on 12/01/2017 , 12/04/2017 1:41 PM with updates by Jhonnie Garner, OTR/L, Admissions Coordinator  ______________________________________________________________________   Discussed status with Dr. Posey Pronto on 12/04/17 at 1:40 PM and received telephone approval for admission today.  Admission Coordinator:  Jhonnie Garner, time 1:40PM Sudie Grumbling 12/04/17.              Cosigned by: [name], MD at 12/04/2017 1:52 PM  Revision History

## 2017-12-04 NOTE — Progress Notes (Signed)
Physical Therapy Treatment Patient Details Name: Dakota [name] MRN: [id number] DOB: 1929-03-03 Today's Date: 12/04/2017    History of Present Illness 84 yo admitted with left weakness and right gaze preference with right basal ganglia infarct s/p revascularization. Admitted to CIR for rehab 7/23. Admitted to Acute 7/26 due to apparent GIB and transferred to ICU. 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, atrial fibrillation    PT Comments    Pt showing improvement in mobility as he required less assist for functional transfers and bed mobilities this session. Continues to present with L lateral lean. Pt is aware and able to correct for brief moments with assist or reaching activities. +2 still required to stabilize. CIR continues to remain appropriate.    Follow Up Recommendations  CIR;Supervision/Assistance - 24 hour     Equipment Recommendations  Other (comment)(TBD)    Recommendations for Other Services       Precautions / Restrictions Precautions Precautions: Fall Precaution Comments: right gaze, left hemiparesis, left lean /pushing Restrictions Weight Bearing Restrictions: No    Mobility  Bed Mobility Overal bed mobility: Needs Assistance Bed Mobility: Supine to Sit Rolling: Min guard Sidelying to sit: +2 for physical assistance;Min assist;HOB elevated       General bed mobility comments: light min A with cues for technique. Increased time and use of bed rails with HOB elevated  Transfers Overall transfer level: Needs assistance Equipment used: 2 person hand held assist Transfers: Sit to/from [facility] Sit to Stand: +2 physical assistance;Mod assist;Min assist Stand pivot transfers: +2 physical assistance;Mod assist       General transfer comment: Assist required to rise and steady. Mod A at times due to L Lateral lean. Able to perform sit<>stand 4x for peri care and ADLs.  Ambulation/Gait                  Stairs             Wheelchair Mobility    Modified Rankin (Stroke Patients Only) Modified Rankin (Stroke Patients Only) Pre-Morbid Rankin Score: No symptoms Modified Rankin: Severe disability     Balance Overall balance assessment: Needs assistance Sitting-balance support: Single extremity supported;Feet supported Sitting balance-Leahy Scale: Poor Sitting balance - Comments: assist ranging from min to mod assist during dynamic activities, pt reports he is aware he is leaning L, but needs cues to correct Postural control: Left lateral lean Standing balance support: Bilateral upper extremity supported Standing balance-Leahy Scale: Poor Standing balance comment: +2 moderate assistance with hands held, min assist and one hand support at sink                            Cognition Arousal/Alertness: Awake/alert Behavior During Therapy: Flat affect Overall Cognitive Status: Impaired/Different from baseline Area of Impairment: Attention;Memory;Following commands;Safety/judgement;Problem solving                   Current Attention Level: Sustained Memory: Decreased short-term memory Following Commands: Follows one step commands with increased time Safety/Judgement: Decreased awareness of safety;Decreased awareness of deficits   Problem Solving: Slow processing;Decreased initiation;Difficulty sequencing;Requires verbal cues;Requires tactile cues General Comments: When asked who was in the room with pt, pt stated "my dad", in reference to his son.      Exercises      General Comments General comments (skin integrity, edema, etc.): Son present during session and eager for pt to return to CIR  Pertinent Vitals/Pain Pain Assessment: Faces Faces Pain Scale: Hurts a little bit Pain Location: back--chronic Pain Descriptors / Indicators: Guarding Pain Intervention(s): Monitored during session;Limited activity within patient's tolerance;Repositioned     Home Living                      Prior Function            PT Goals (current goals can now be found in the care plan section) Acute Rehab PT Goals Patient Stated Goal: go home and be with wife PT Goal Formulation: With family Time For Goal Achievement: 12/13/17 Potential to Achieve Goals: Fair Progress towards PT goals: Progressing toward goals    Frequency    Min 3X/week      PT Plan Current plan remains appropriate    Co-evaluation PT/OT/SLP Co-Evaluation/Treatment: Yes Reason for Co-Treatment: Complexity of the patient's impairments (multi-system involvement);For patient/therapist safety;To address functional/ADL transfers PT goals addressed during session: Mobility/safety with mobility OT goals addressed during session: ADL's and self-care;Strengthening/ROM      AM-PAC PT "6 Clicks" Daily Activity  Outcome Measure  Difficulty turning over in bed (including adjusting bedclothes, sheets and blankets)?: Unable Difficulty moving from lying on back to sitting on the side of the bed? : Unable Difficulty sitting down on and standing up from a chair with arms (e.g., wheelchair, bedside commode, etc,.)?: Unable Help needed moving to and from a bed to chair (including a wheelchair)?: A Lot Help needed walking in hospital room?: A Lot Help needed climbing 3-5 steps with a railing? : Total 6 Click Score: 8    End of Session   Activity Tolerance: Patient tolerated treatment well Patient left: in chair;with call bell/phone within reach;with family/visitor present;with chair alarm set Nurse Communication: Mobility status PT Visit Diagnosis: Other abnormalities of gait and mobility (R26.89);Other symptoms and signs involving the nervous system (R29.898);Hemiplegia and hemiparesis;Apraxia (R48.2);Muscle weakness (generalized) (M62.81) Hemiplegia - Right/Left: Left Hemiplegia - dominant/non-dominant: Non-dominant Hemiplegia - caused by: Cerebral infarction      Time: 5409-8119 PT Time Calculation (min) (ACUTE ONLY): 26 min  Charges:  $Neuromuscular Re-education: 8-22 mins                     Benjiman Core, [place] Pager [phone] Acute Rehab   Allena Katz 12/04/2017, 1:30 PM

## 2017-12-04 NOTE — IPOC Note (Signed)
Overall Plan of Care [facility]) Patient Details Name: Dakota [name] MRN: [id number] DOB: 08/08/1928  Admitting Diagnosis: right MCA infarct with GI bleed  Hospital Problems: Active Problems:   Acute ischemic right MCA stroke ([place])   Acute blood loss anemia   Delirium   History of GI bleed     Functional Problem List: Nursing Bladder, Bowel, Endurance, Medication Management, Motor, Nutrition, Pain, Safety, Sensory, Perception, Skin Integrity  PT Balance, Motor, Safety, Endurance, Perception  OT Balance, Cognition, Endurance, Safety, Perception, Vision, Sensory  SLP Cognition, Nutrition  TR         Basic ADL's: OT Eating, Grooming, Bathing, Dressing, Toileting     Advanced  ADL's: OT       Transfers: PT Bed Mobility, Bed to Chair, Car, Patent attorney, Agricultural engineer: PT Ambulation, Data processing manager, Emergency planning/management officer     Additional Impairments: OT Fuctional Use of Upper Extremity  SLP Swallowing, Communication, Social Cognition expression Problem Solving, Memory, Awareness  TR      Anticipated Outcomes Item Anticipated Outcome  Self Feeding supervision/set up  Swallowing  Min A   Basic self-care  min A  Toileting  min A   Bathroom Transfers min A  Bowel/Bladder  mod assist  Transfers  MinA with LRAD   Locomotion  MinA with LRAD   Communication  Min A  Cognition  Min A  Pain  less than 3  Safety/Judgment  mod assist    Therapy Plan: PT Intensity: Minimum of 1-2 x/day ,45 to 90 minutes PT Frequency: 5 out of 7 days PT Duration Estimated Length of Stay: 21-24 days  OT Intensity: Minimum of 1-2 x/day, 45 to 90 minutes OT Frequency: 5 out of 7 days OT Duration/Estimated Length of Stay: 21-24 days SLP Intensity: Minumum of 1-2 x/day, 30 to 90 minutes SLP Frequency: 3 to 5 out of 7 days SLP Duration/Estimated Length of Stay: 21-24 days     Team Interventions: Nursing Interventions Patient/Family Education, Bladder Management, Bowel  Management, Disease Management/Prevention, Pain Management, Medication Management, Skin Care/Wound Management, Dysphagia/Aspiration Precaution Training, Discharge Planning  PT interventions Ambulation/gait training, Cognitive remediation/compensation, Discharge planning, DME/adaptive equipment instruction, Functional mobility training, Neuromuscular re-education, Patient/family education, Stair training, Therapeutic Activities, Therapeutic Exercise, UE/LE Strength taining/ROM, UE/LE Coordination activities, Visual/perceptual remediation/compensation, Wheelchair propulsion/positioning  OT Interventions Balance/vestibular training, Cognitive remediation/compensation, Discharge planning, DME/adaptive equipment instruction, Functional mobility training, Neuromuscular re-education, Patient/family education, Psychosocial support, Self Care/advanced ADL retraining, Therapeutic Activities, Therapeutic Exercise, UE/LE Strength taining/ROM, UE/LE Coordination activities, Visual/perceptual remediation/compensation  SLP Interventions Cognitive remediation/compensation, Cueing hierarchy, Dysphagia/aspiration precaution training, Functional tasks, Environmental controls, Internal/external aids, Patient/family education  TR Interventions    SW/CM Interventions Discharge Planning, Psychosocial Support, Patient/Family Education   Barriers to Discharge MD  Medical stability  Nursing Incontinence    PT Decreased caregiver support level of assist that may be needed as family is already taking care of his wife, who is totalA   OT      SLP Nutrition means    SW       Team Discharge Planning: Destination: PT-Home ,OT- Home , SLP-Home Projected Follow-up: PT-Other (comment), 24 hour supervision/assistance(TBD pending on how patient progresses and how much family is able to assist in care ), OT-  Home health OT, SLP-Home Health SLP, 24 hour supervision/assistance, Skilled Nursing facility Projected Equipment Needs:  PT-3 in 1 bedside comode, Wheelchair (measurements), Tub/shower bench, To be determined, OT- To be determined, SLP-None recommended by SLP Equipment Details: PT-LRAD TBD as patient  progresses , OT-  Patient/family involved in discharge planning: PT- Patient, Family member/caregiver,  OT-Patient, SLP-Family member/caregiver  MD ELOS: 20-24 days. Medical Rehab Prognosis:  Good Assessment: 84 year old male with history of hypertension, complete heart block status post permanent pacemaker, GERD/Schatzki's ring, AF no Coumadin who was admitted on zero 7/18 10/2017 with left-sided weakness, right gaze preference, slurred speech and mental status changes due to right MCA territory infarct.  He underwent cerebral Angio revascularization of right MCA M2 segment by Dr. Estanislado Pandy.  He continued to have issues with confusion and delirium and repeat CT head on 7/19 reviewed, showing extension of right MCA infarct. Per report, propagation of acute right MCA territory infarct involving right insula and right temporoparietal lobe.  Stroke was felt to be embolic due to A. fib and patient was started on Eliquis 5 mg twice daily at time of admission for inpatient rehab program in 11/21/2017.  Hospital course was significant for sleep-wake disruption with bouts of agitation, poor p.o. intake but activity tolerance was improving.  On 11/24/2017 he developed maroon stools due to GI bleed and was transfused to acute services for closer monitoring.  Serial H&H was recommended by Dr. Oletta Lamas however he continued to have maroonish blood/bloody stools. He underwent EGD on 11/27/2017 revealing nonsevere gastritis in the lower third of esophagus, moderate inflammation with erosion and erythema in gastric antrum, one partially obstructing nonbleeding cratered duodenal ulcer and biopsies were taken.  He was placed on clear liquid diet and started on IV Protonix 80 mg daily.  He has been transfused with multiple units packed red blood cells.   He was started on IV heparin on 11/28/2017 however had another episode of bloody stools with drop in Hgb to 7.8. He was transfused with 2 units packed red blood cells and t in the first 30 minutes and do not just be stopping them if the a.m. reated with fluid bolus for hypotension. Dr. Dr. Oletta Lamas recommended avoiding anticoagulation for some time and repeat EGD only if bleeding reoccurs. Patient had issues with recurrent agitation and Seroquel resumed.  H/H stable and he was started on dysphagia 1, nectar liquids on 8/1.  Mentation and p.o. intake is reported to be improving.  Therapy is resumed and patient continues to be limited by weakness with right lean left-sided weakness and difficulty standing.  Will set goals for Min A with PT/OT/SLP.  See Team Conference Notes for weekly updates to the plan of care

## 2017-12-04 NOTE — Progress Notes (Signed)
Occupational Therapy Treatment Patient Details Name: Dakota [name] MRN: [id number] DOB: 1929-03-29 Today's Date: 12/04/2017    History of present illness 84 yo admitted with left weakness and right gaze preference with right basal ganglia infarct s/p revascularization. Admitted to CIR for rehab 7/23. Admitted to Acute 7/26 due to apparent GIB and transferred to ICU. 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, atrial fibrillation   OT comments  Pt remains highly motivated to work with therapies. Focus of session on bed mobility, sitting balance, sit<>stand and L UE reaching. Pt continues to need 2 person assist for mobility, but did not require lift equipment this visit. Continue to recommend intensive rehab in CIR.   Follow Up Recommendations  CIR;Supervision/Assistance - 24 hour    Equipment Recommendations  3 in 1 bedside commode;Wheelchair (measurements OT);Wheelchair cushion (measurements OT);Hospital bed    Recommendations for Other Services      Precautions / Restrictions Precautions Precautions: Fall Precaution Comments: right gaze, left hemiparesis, left lean /pushing       Mobility Bed Mobility Overal bed mobility: Needs Assistance Bed Mobility: Supine to Sit   Sidelying to sit: +2 for physical assistance;Min assist       General bed mobility comments: increased time, cues for technique and to use rail, assist to raise trunk  Transfers Overall transfer level: Needs assistance Equipment used: 2 person hand held assist Transfers: Sit to/from Stand;Stand Pivot Transfers Sit to Stand: +2 physical assistance;Mod assist;Min assist Stand pivot transfers: +2 physical assistance;Mod assist       General transfer comment: assist to rise and steady, performed sit to stand x 4 from chair, transferred toward R to recliner    Balance Overall balance assessment: Needs assistance   Sitting balance-Leahy Scale: Poor Sitting balance - Comments: assist  ranging from min to mod assist during dynamic activities, pt reports he is aware he is leaning L, but needs cues to correct Postural control: Left lateral lean   Standing balance-Leahy Scale: Poor Standing balance comment: +2 moderate assistance with hands held, min assist and one hand support at sink                           ADL either performed or assessed with clinical judgement   ADL Overall ADL's : Needs assistance/impaired Eating/Feeding: Maximal assistance;Sitting Eating/Feeding Details (indicate cue type and reason): requires sips with spoon Grooming: Brushing hair;Standing;Minimal assistance Grooming Details (indicate cue type and reason): stood with R UE stabilizing on sink and min assist for standing balance             Lower Body Dressing: Total assistance;Bed level Lower Body Dressing Details (indicate cue type and reason): sock     Toileting- Clothing Manipulation and Hygiene: Total assistance;Sit to/from stand;+2 for physical assistance         General ADL Comments: Worked on sitting balance, activation of trunk through reaching activities at EOB, pt needing min to mod assist with balance due to L side leaning     Vision       Perception     Praxis      Cognition Arousal/Alertness: Awake/alert Behavior During Therapy: Flat affect Overall Cognitive Status: Impaired/Different from baseline Area of Impairment: Attention;Memory;Following commands;Safety/judgement;Problem solving                   Current Attention Level: Sustained Memory: Decreased short-term memory Following Commands: Follows one step commands with increased time Safety/Judgement: Decreased awareness of  safety;Decreased awareness of deficits   Problem Solving: Slow processing;Decreased initiation;Difficulty sequencing;Requires verbal cues;Requires tactile cues          Exercises     Shoulder Instructions       General Comments      Pertinent Vitals/  Pain       Pain Assessment: Faces Faces Pain Scale: Hurts a little bit Pain Location: back--chronic Pain Descriptors / Indicators: Guarding Pain Intervention(s): Monitored during session;Repositioned  Home Living                                          Prior Functioning/Environment              Frequency  Min 3X/week        Progress Toward Goals  OT Goals(current goals can now be found in the care plan section)  Progress towards OT goals: Progressing toward goals  Acute Rehab OT Goals Patient Stated Goal: go home and be with wife OT Goal Formulation: With patient/family Time For Goal Achievement: 12/13/17 Potential to Achieve Goals: Good  Plan Discharge plan remains appropriate    Co-evaluation    PT/OT/SLP Co-Evaluation/Treatment: Yes Reason for Co-Treatment: For patient/therapist safety   OT goals addressed during session: ADL's and self-care;Strengthening/ROM      AM-PAC PT "6 Clicks" Daily Activity     Outcome Measure   Help from another person eating meals?: Total Help from another person taking care of personal grooming?: A Lot Help from another person toileting, which includes using toliet, bedpan, or urinal?: Total Help from another person bathing (including washing, rinsing, drying)?: A Lot Help from another person to put on and taking off regular upper body clothing?: A Lot Help from another person to put on and taking off regular lower body clothing?: Total 6 Click Score: 9    End of Session Equipment Utilized During Treatment: Gait belt  OT Visit Diagnosis: Unsteadiness on feet (R26.81);Cognitive communication deficit (R41.841);Hemiplegia and hemiparesis;Other abnormalities of gait and mobility (R26.89);Muscle weakness (generalized) (M62.81);Apraxia (R48.2);Other symptoms and signs involving cognitive function Symptoms and signs involving cognitive functions: Cerebral infarction Hemiplegia - Right/Left: Left Hemiplegia -  dominant/non-dominant: Non-Dominant Hemiplegia - caused by: Cerebral infarction   Activity Tolerance Patient tolerated treatment well   Patient Left in chair;with call bell/phone within reach;with chair alarm set;with family/visitor present   Nurse Communication          Time: 8828-0034 OT Time Calculation (min): 26 min  Charges: OT General Charges $OT Visit: 1 Visit OT Treatments $Therapeutic Activity: 8-22 mins  12/04/2017 Nestor Lewandowsky, OTR/L Pager: [phone]   Werner Lean, Haze Boyden 12/04/2017, 1:18 PM

## 2017-12-04 NOTE — H&P (Addendum)
Physical Medicine and Rehabilitation Admission H&P    CC: Functional deficits.   HPI: Dakota [name]. Gae Dry is an 84 year old male with history of hypertension, complete heart block status post permanent pacemaker, GERD/Schatzki's ring, AF no Coumadin who was admitted on zero 7/18 10/2017 with left-sided weakness, right gaze preference, slurred speech and mental status changes due to right MCA territory infarct.  History taken from chart review and son. He underwent cerebral Angio revascularization of right MCA M2 segment by Dr. Estanislado Pandy.  He continued to have issues with confusion and delirium and repeat CT head on 7/19 reviewed, showing extension of right MCA infarct. Per report, propagation of acute right MCA territory infarct involving right insula and right temporoparietal lobe.  Stroke was felt to be embolic due to A. fib and patient was started on Eliquis 5 mg twice daily at time of admission for inpatient rehab program in 11/21/2017.  Hospital course was significant for sleep-wake disruption with bouts of agitation, poor p.o. intake but activity tolerance was improving.  On 11/24/2017 he developed maroon stools due to GI bleed and was transfused to acute services for closer monitoring.  Serial H&H was recommended by Dr. Oletta Lamas however he continued to have maroonish blood/bloody stools.   He underwent EGD on 11/27/2017 revealing nonsevere gastritis in the lower third of esophagus, moderate inflammation with erosion and erythema in gastric antrum, one partially obstructing nonbleeding cratered duodenal ulcer and biopsies were taken.  He was placed on clear liquid diet and started on IV Protonix 80 mg daily.  He has been transfused with multiple units packed red blood cells.  He was started on IV heparin on 11/28/2017 however had another episode of bloody stools with drop in Hgb to 7.8. He was transfused with 2 units packed red blood cells and t in the first 30 minutes and do not just be stopping them  if the a.m. reated with fluid bolus for hypotension.   Dr. Dr. Oletta Lamas recommended avoiding anticoagulation for some time and repeat EGD only if bleeding reoccurs. Patient had issues with recurrent agitation and Seroquel resumed.  H/H stable and he was started on dysphagia 1, nectar liquids on 8/1.  Mentation and p.o. intake is reported to be improving.  Therapy is resumed and patient continues to be limited by weakness with right lean left-sided weakness and difficulty standing.  CIR was recommended due to deficits in mobility and self-care tasks   Review of Systems  Unable to perform ROS: Mental acuity     Past Medical History:  Diagnosis Date  . Anxiety   . Back pain, chronic   . Complete heart block ([place]) 07/18/2014   Medtronic [place] model [id number] (serial number [id number])  singe lead PPM  . Esophageal obstruction due to food impaction 2017  . GERD (gastroesophageal reflux disease) 10/2002  . Hyperlipidemia 12/1994  . Hypertension   . Hypokalemia   . Lung collapse 03/1991   Fall  . Permanent atrial fibrillation ([name])    Refused coumadin therapy    Past Surgical History:  Procedure Laterality Date  . BACK SURGERY  11/91   with hardware fixation  . BIOPSY  11/27/2017   Procedure: BIOPSY;  Surgeon: Laurence Spates, MD;  Location: [place];  Service: Endoscopy;;  . CARDIAC CATHETERIZATION  06/2004   30 % stenosis, EF normal  . ESOPHAGOGASTRODUODENOSCOPY N/A 01/06/2016   Procedure: ESOPHAGOGASTRODUODENOSCOPY (EGD);  Surgeon: Otis Brace, MD;  Location: [place];  Service: Gastroenterology;  Laterality: N/A;  . ESOPHAGOGASTRODUODENOSCOPY N/A 11/27/2017  Procedure: ESOPHAGOGASTRODUODENOSCOPY (EGD);  Surgeon: Laurence Spates, MD;  Location: [facility] ENDOSCOPY;  Service: Endoscopy;  Laterality: N/A;  . EYE SURGERY     Retinal bubble surgery, cataract   . IR CT HEAD LTD  11/16/2017  . IR PERCUTANEOUS ART THROMBECTOMY/INFUSION INTRACRANIAL INC DIAG ANGIO  11/16/2017  . LEFT HEART  CATHETERIZATION WITH CORONARY ANGIOGRAM N/A 07/18/2014   Procedure: LEFT HEART CATHETERIZATION WITH CORONARY ANGIOGRAM;  Surgeon: Jettie Booze, MD; [facility] [place], LAD mild dz, D1 80%, D2 OK, CFX system OK, RCA OK, PDA 100%, med rx  . PERMANENT PACEMAKER INSERTION N/A 07/18/2014   Procedure: PERMANENT PACEMAKER INSERTION;  Surgeon: Thompson Grayer, MD; Medtronic [place] model [id number] (serial number [id number])    . RADIOLOGY WITH ANESTHESIA N/A 11/16/2017   Procedure: RADIOLOGY WITH ANESTHESIA;  Surgeon: Luanne Bras, MD;  Location: [place];  Service: Radiology;  Laterality: N/A;  . TEMPORARY PACEMAKER INSERTION N/A 07/18/2014   Procedure: TEMPORARY PACEMAKER INSERTION;  Surgeon: Peter M Martinique, MD;  Location: [facility] CATH LAB;  Service: Cardiovascular;  Laterality: N/A;    Family History  Problem Relation Age of Onset  . Pneumonia Mother   . Hip fracture Mother   . Cancer Father        splenic  . Heart disease Sister        Pacer placed  . Cancer Brother        throat and lung  . Cancer Brother        prostate, age 27  . Prostate cancer Brother   . Heart disease Son   . Stroke Neg Hx   . Colon cancer Neg Hx     Social History: Married. Independent PTA. Per  reports that he quit smoking about 64 years ago. He has a 15.00 pack-year smoking history. His smokeless tobacco use includes chew. Per reports that he does not drink alcohol or use drugs.   Allergies  Allergen Reactions  . Warfarin And Related Other (See Comments)    Lost a lot of weight and also experienced numbness  . Atorvastatin Other (See Comments)    REACTION: MUSCLE PAIN  . Ezetimibe Other (See Comments)    REACTION: MUSCLE PAIN  . Diazepam Other (See Comments)    UNKNOWN, per pt  . Penicillins Other (See Comments)    UNKNOWN, per pt    Medications Prior to Admission  Medication Sig Dispense Refill  . divalproex (DEPAKOTE SPRINKLE) 125 MG capsule Take 2 capsules (250 mg total) by mouth every 8 (eight) hours.    .  Maltodextrin-Xanthan Gum (RESOURCE THICKENUP CLEAR) POWD Take 120 g by mouth as needed (nectar thick liquid consistency).    . nicotine (NICODERM CQ - DOSED IN MG/24 HR) 7 mg/24hr patch Place 1 patch (7 mg total) onto the skin daily. 28 patch 0  . pantoprazole (PROTONIX) 40 MG tablet Take 1 tablet (40 mg total) by mouth daily. 30 tablet 0  . QUEtiapine (SEROQUEL) 25 MG tablet Take 0.5 tablets (12.5 mg total) by mouth at bedtime.      Drug Regimen Review  Drug regimen was reviewed and remains appropriate with no significant issues identified  Home: Home Living Family/patient expects to be discharged to:: Inpatient rehab Living Arrangements: Spouse/significant other Available Help at Discharge: Family, Available 24 hours/day Type of Home: House Home Access: Ramped entrance Home Layout: One level Bathroom Shower/Tub: Gaffer, Chiropodist: Standard Bathroom Accessibility: Yes Home Equipment: Environmental consultant - 2 wheels, Transport chair, Bedside commode Additional Comments: pt was primary  caregiver for wife who does not walk, pt would transfer her and family assists with bathing  Lives With: Spouse     Functional History: Prior Function Level of Independence: Independent Gait / Transfers Assistance Needed: independent without AD ADL's / Homemaking Assistance Needed: INdpendent without AD Communication / Swallowing Assistance Needed: intact Comments: very independent, 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 Pt has 4 kids who can assist  Functional Status:  Mobility: Bed Mobility Overal bed mobility: Needs Assistance Bed Mobility: Supine to Sit Rolling: Min guard Sidelying to sit: +2 for physical assistance, Min assist, HOB elevated Supine to sit: Total assist, +2 for physical assistance General bed mobility comments: light min A with cues for technique. Increased time and use of bed rails with HOB elevated Transfers Overall transfer level: Needs  assistance Equipment used: 2 person hand held assist Transfer via [place]: Stedy Transfers: Sit to/from Stand, Risk manager Sit to Stand: +2 physical assistance, Mod assist, Min assist Stand pivot transfers: +2 physical assistance, Mod assist General transfer comment: Assist required to rise and steady. Mod A at times due to L Lateral lean. Able to perform sit<>stand 4x for peri care and ADLs. Ambulation/Gait General Gait Details: unable   ADL: Overall ADL's : Needs assistance/impaired Eating/Feeding: Maximal assistance, Sitting Eating/Feeding Details (indicate cue type and reason): requires sips with spoon Grooming: Brushing hair, Standing, Minimal assistance Grooming Details (indicate cue type and reason): stood with R UE stabilizing on sink and min assist for standing balance Upper Body Bathing: Maximal assistance Lower Body Bathing: Total assistance Upper Body Dressing : Maximal assistance Lower Body Dressing: Total assistance, Bed level Lower Body Dressing Details (indicate cue type and reason): sock Toilet Transfer: +2 for physical assistance, Maximal assistance Toileting- Clothing Manipulation and Hygiene: Total assistance, Sit to/from stand, +2 for physical assistance Toileting - Clothing Manipulation Details (indicate cue type and reason): incontinenet of apparent large melanic stool; nsg aware Functional mobility during ADLs: Maximal assistance, +2 for physical assistance General ADL Comments: Worked on sitting balance, activation of trunk through reaching activities at EOB, pt needing min to mod assist with balance due to L side leaning  Cognition: Cognition Overall Cognitive Status: Impaired/Different from baseline Orientation Level: Oriented to person, Oriented to place, Disoriented to time, Disoriented to situation Cognition Arousal/Alertness: Awake/alert Behavior During Therapy: Flat affect Overall Cognitive Status: Impaired/Different from  baseline Area of Impairment: Attention, Memory, Following commands, Safety/judgement, Problem solving Orientation Level: Disoriented to, Place, Time, Situation Current Attention Level: Sustained Memory: Decreased short-term memory Following Commands: Follows one step commands with increased time Safety/Judgement: Decreased awareness of safety, Decreased awareness of deficits Awareness: Intellectual Problem Solving: Slow processing, Decreased initiation, Difficulty sequencing, Requires verbal cues, Requires tactile cues General Comments: When asked who was in the room with pt, pt stated "my dad", in reference to his son.   Blood pressure (!) 121/57, pulse (!) 56, temperature 97.6 F (36.4 C), temperature source Oral, resp. rate 20, weight 76.1 kg (167 lb 12.3 oz), SpO2 100 %. Physical Exam  Vitals reviewed. Constitutional: He appears well-developed and well-nourished. He appears cachectic. He is easily aroused.  HENT:  Head: Normocephalic and atraumatic.  Eyes: EOM are normal. Right eye exhibits no discharge. Left eye exhibits no discharge.  Neck: Normal range of motion. Neck supple.  Cardiovascular:  Irregularly irregular  Respiratory: Effort normal and breath sounds normal.  GI: Soft. Bowel sounds are normal.  Musculoskeletal:  No edema or tenderness in extremities  Neurological: He is alert and easily  aroused.  Moderate to severely dysarthric speech Oriented 1 Motor: Right upper extremity: 5/5 proximal distal Left upper extremity: 4+/5 proximal to distal Bilateral lower extremities: Hip flexion 3 -/5, knee extension 3/5, ankle dorsiflexion 4+/5  Skin: Skin is warm and dry.  Psychiatric: His speech is tangential and slurred. Cognition and memory are impaired.    Results for orders placed or performed during the hospital encounter of 11/24/17 (from the past 48 hour(s))  CBC     Status: Abnormal   Collection Time: 12/03/17  4:59 AM  Result Value Ref Range   WBC 11.7 (H) 4.0 -  10.5 K/uL   RBC 3.07 (L) 4.22 - 5.81 MIL/uL   Hemoglobin 9.2 (L) 13.0 - 17.0 g/dL   HCT 28.1 (L) 39.0 - 52.0 %   MCV 91.5 78.0 - 100.0 fL   MCH 30.0 26.0 - 34.0 pg   MCHC 32.7 30.0 - 36.0 g/dL   RDW 14.9 11.5 - 15.5 %   Platelets 307 150 - 400 K/uL    Comment: Performed at [facility], [place] [address], [place], [place] [address]  Glucose, capillary     Status: Abnormal   Collection Time: 12/03/17  7:47 AM  Result Value Ref Range   Glucose-Capillary 112 (H) 70 - 99 mg/dL  Glucose, capillary     Status: Abnormal   Collection Time: 12/04/17  7:45 AM  Result Value Ref Range   Glucose-Capillary 100 (H) 70 - 99 mg/dL   No results found.     Medical Problem List and Plan: 1.  Right lean, left-sided weakness,dysphagia, cognitive deficits and difficulty standing secondary to right MCA infarct with GI bleed. 2.  DVT Prophylaxis/Anticoagulation: Mechanical:  Antiembolism stockings, knee (TED hose) Bilateral lower extremities 3. Pain Management: tylenol prn 4. Mood: LCSW to follow for evaluation and support as needed.  5. Neuropsych: This patient is not capable of making decisions on his own behalf. 6. Skin/Wound Care: routine pressure relief measures.  7. Fluids/Electrolytes/Nutrition: Monitor I/O. Check lytes in am.  8. ABLA due to duodenal ulcer/GIB: Now constipated per reports--will increase senna to bid.  Monitor H/H serial checks. Increase protonix to 40 mg bid. 9.  Chronic A fib: Monitor HR bid. No anticoagulation for now. No medications due to hypotension.  10. Delirium/sundowning: Continue Depakote tid with Seroquel at bedtime.      Post Admission Physician Evaluation: 1. Preadmission assessment reviewed and changes made below. 2. Functional deficits secondary  to right MCA infarct with GI bleed. 3. Patient is admitted to receive collaborative, interdisciplinary care between the physiatrist, rehab nursing staff, and therapy team. 4. Patient's level of medical complexity  and substantial therapy needs in context of that medical necessity cannot be provided at a lesser intensity of care such as a SNF. 5. Patient has experienced substantial functional loss from his/her baseline which was documented above under the "Functional History" and "Functional Status" headings.  Judging by the patient's diagnosis, physical exam, and functional history, the patient has potential for functional progress which will result in measurable gains while on inpatient rehab.  These gains will be of substantial and practical use upon discharge  in facilitating mobility and self-care at the household level. 6. Physiatrist will provide 24 hour management of medical needs as well as oversight of the therapy plan/treatment and provide guidance as appropriate regarding the interaction of the two. 7. 24 hour rehab nursing will assist with bowel management, safety, disease management, medication administration and patient education  and help integrate  therapy concepts, techniques,education, etc. 8. PT will assess and treat for/with: Lower extremity strength, range of motion, stamina, balance, functional mobility, safety, adaptive techniques and equipment, coping skills, pain control, stroke education. Goals are: Min A/Supervision. 9. OT will assess and treat for/with: ADL's, functional mobility, safety, upper extremity strength, adaptive techniques and equipment, ego support, and community reintegration.   Goals are: Min A/Supervision. Therapy may proceed with showering this patient. 10. SLP will assess and treat for/with: swallowing, language, speech, cognition.  Goals are: Min A. 11. Case Management and Social Worker will assess and treat for psychological issues and discharge planning. 12. Team conference will be held weekly to assess progress toward goals and to determine barriers to discharge. 13. Patient will receive at least 3 hours of therapy per day at least 5 days per week. 14. ELOS: 17-20  days.       15. Prognosis:  good  I have personally performed a face to face diagnostic evaluation, including, but not limited to relevant history and physical exam findings, of this patient and developed relevant assessment and plan.  Additionally, I have reviewed and concur with the physician assistant's documentation above.  Delice Lesch, MD, ABPMR Bary Leriche, PA-C 12/04/2017

## 2017-12-04 NOTE — Consult Note (Addendum)
   [facility] CM Inpatient Consult   12/04/2017  KEEVON HENNEY Dec 01, 1928 [id number]    Patient screened for potential [facility] Care Management services due to unplanned readmission risk score of 23% (high).  Spoke with inpatient RNCM. Recommendations are for CIR. Will continue to follow along for disposition plans and engage for [facility] Management if appropriate.   Marthenia Rolling, MSN-Ed, RN,BSN [facility] Liaison [phone]

## 2017-12-05 ENCOUNTER — Other Ambulatory Visit: Payer: Self-pay

## 2017-12-05 ENCOUNTER — Inpatient Hospital Stay (HOSPITAL_COMMUNITY): Payer: Medicare Other | Admitting: Occupational Therapy

## 2017-12-05 ENCOUNTER — Inpatient Hospital Stay (HOSPITAL_COMMUNITY): Payer: Medicare Other | Admitting: Physical Therapy

## 2017-12-05 ENCOUNTER — Inpatient Hospital Stay (HOSPITAL_COMMUNITY): Payer: Medicare Other

## 2017-12-05 DIAGNOSIS — R7303 Prediabetes: Secondary | ICD-10-CM

## 2017-12-05 DIAGNOSIS — Z8719 Personal history of other diseases of the digestive system: Secondary | ICD-10-CM

## 2017-12-05 DIAGNOSIS — E46 Unspecified protein-calorie malnutrition: Secondary | ICD-10-CM

## 2017-12-05 LAB — COMPREHENSIVE METABOLIC PANEL
ALT: 30 U/L (ref 0–44)
AST: 25 U/L (ref 15–41)
Albumin: 2.5 g/dL — ABNORMAL LOW (ref 3.5–5.0)
Alkaline Phosphatase: 46 U/L (ref 38–126)
Anion gap: 8 (ref 5–15)
BUN: 10 mg/dL (ref 8–23)
CALCIUM: 8.8 mg/dL — AB (ref 8.9–10.3)
CO2: 29 mmol/L (ref 22–32)
CREATININE: 1.01 mg/dL (ref 0.61–1.24)
Chloride: 102 mmol/L (ref 98–111)
Glucose, Bld: 115 mg/dL — ABNORMAL HIGH (ref 70–99)
Potassium: 3.9 mmol/L (ref 3.5–5.1)
Sodium: 139 mmol/L (ref 135–145)
TOTAL PROTEIN: 5.7 g/dL — AB (ref 6.5–8.1)
Total Bilirubin: 0.7 mg/dL (ref 0.3–1.2)

## 2017-12-05 LAB — CBC WITH DIFFERENTIAL/PLATELET
Abs Immature Granulocytes: 0.2 10*3/uL — ABNORMAL HIGH (ref 0.0–0.1)
BASOS ABS: 0.1 10*3/uL (ref 0.0–0.1)
Basophils Relative: 1 %
EOS ABS: 0.4 10*3/uL (ref 0.0–0.7)
EOS PCT: 4 %
HEMATOCRIT: 32.1 % — AB (ref 39.0–52.0)
Hemoglobin: 10.2 g/dL — ABNORMAL LOW (ref 13.0–17.0)
IMMATURE GRANULOCYTES: 2 %
Lymphocytes Relative: 29 %
Lymphs Abs: 2.8 10*3/uL (ref 0.7–4.0)
MCH: 29.2 pg (ref 26.0–34.0)
MCHC: 31.8 g/dL (ref 30.0–36.0)
MCV: 92 fL (ref 78.0–100.0)
Monocytes Absolute: 1.3 10*3/uL — ABNORMAL HIGH (ref 0.1–1.0)
Monocytes Relative: 14 %
NEUTROS PCT: 50 %
Neutro Abs: 4.9 10*3/uL (ref 1.7–7.7)
PLATELETS: 332 10*3/uL (ref 150–400)
RBC: 3.49 MIL/uL — AB (ref 4.22–5.81)
RDW: 14.6 % (ref 11.5–15.5)
WBC: 9.7 10*3/uL (ref 4.0–10.5)

## 2017-12-05 MED ORDER — PRO-STAT SUGAR FREE PO LIQD
30.0000 mL | Freq: Two times a day (BID) | ORAL | Status: DC
  Administered 2017-12-05 – 2017-12-26 (×42): 30 mL via ORAL
  Filled 2017-12-05 (×42): qty 30

## 2017-12-05 MED ORDER — QUETIAPINE FUMARATE 25 MG PO TABS
12.5000 mg | ORAL_TABLET | Freq: Every day | ORAL | Status: DC
  Administered 2017-12-05 – 2017-12-06 (×2): 12.5 mg via ORAL
  Filled 2017-12-05 (×2): qty 1

## 2017-12-05 NOTE — Progress Notes (Signed)
Recreational Therapy Session Note  Patient Details  Name: Dakota [name] MRN: [id number] Date of Birth: February 24, 1929 Today's Date: 12/05/2017   Pt is not yet appropriate for TR services, eval deferred.  Will continue to monitor through team for future participation. Riley 12/05/2017, 3:53 PM

## 2017-12-05 NOTE — Progress Notes (Signed)
The bed alarm sounded at approximately 0400. Patient's leg was across the rail. He was checked but was dry. He had again moved down in the bed & was repositioned. From that time, he was awoke off & on. At 0520, he was seen putting his leg over the bed rail again. Patinet was a little confused, reaching for the suction. When asked if he needed it, he gestured for it to go toward his penile area, believed that he thought that it was the tubing from the condom catheter he had on earlier. Explained to the patient what it was for. He did not sound like he needed suctioning. Patient was also dry. Patients legs were placed back into the bed & he position was readjusted. No c/o pain, no signs of acute distress. Vitals were WNL. Will continue to monitor.

## 2017-12-05 NOTE — Care Management (Signed)
[facility] Individual Statement of Services  Patient Name:  Dakota [name]  Date:  12/05/2017  Welcome to the [facility].  Our goal is to provide you with an individualized program based on your diagnosis and situation, designed to meet your specific needs.  With this comprehensive rehabilitation program, you will be expected to participate in at least 3 hours of rehabilitation therapies Monday-Friday, with modified therapy programming on the weekends.  Your rehabilitation program will include the following services:  Physical Therapy (PT), Occupational Therapy (OT), Speech Therapy (ST), 24 hour per day rehabilitation nursing, Therapeutic Recreaction (TR), Neuropsychology, Case Management (Social Worker), Rehabilitation Medicine, Nutrition Services and Pharmacy Services  Weekly team conferences will be held on Wednesdays to discuss your progress.  Your Social Worker will talk with you frequently to get your input and to update you on team discussions.  Team conferences with you and your family in attendance may also be held.  Expected length of stay: 21-24 days   Overall anticipated outcome: minimal assistance  Depending on your progress and recovery, your program may change. Your Social Worker will coordinate services and will keep you informed of any changes. Your Social Worker's name and contact numbers are listed  below.  The following services may also be recommended but are not provided by the [place] will be made to provide these services after discharge if needed.  Arrangements include referral to agencies that provide these services.  Your insurance has been verified to be:  Medicare and Production manager Your primary doctor is:  [name]  Pertinent information will be shared with your doctor and  your insurance company.  Social Worker:  [place], [place] or ([phone]  Information discussed with and copy given to patient by: Lennart Pall, 12/05/2017, 3:33 PM

## 2017-12-05 NOTE — Progress Notes (Signed)
Social Work Patient ID: Dakota [name], male   DOB: Dec 21, 1928, 84 y.o.   MRN: [id number]   Copy of original psychosocial assessment. Reviewed with son, [place], and no changes needed.  Pt returning from acute/ GI bleed.  Pt still with cognitive deficits and cannot really engage much with interview.  Son states family still planning for d/c home and they will provide 24/7 assistance.      Lowella Curb, [place]  Social Worker  General Practice  Progress Notes  Signed  Date of Service:  11/23/2017  3:05 PM       Related encounter: Admission (Discharged) from 11/21/2017 in [facility] [place] [facility]      Signed          Show:Clear all '[x]' Manual'[x]' Template'[]' Copied  Added by: '[x]' Lowella Curb, LCSW   '[]' Hover for details   Social Work Assessment and Plan Social Work Assessment and Plan   Patient Details  Name: Dakota [name] MRN: [id number] Date of Birth: 10-23-1928   Today's Date: 11/23/2017   Problem List:      Patient Active Problem List    Diagnosis Date Noted  . Acute lower UTI    . Leucocytosis    . Hypoalbuminemia due to protein-calorie malnutrition ([place])    . Prediabetes    . Prerenal azotemia    . Acute delirium 11/21/2017  . Dysphagia due to recent cerebrovascular accident 11/21/2017  . Chewing tobacco nicotine dependence 11/21/2017  . Leukocytosis 11/21/2017  . Acute ischemic right MCA stroke ([place]) 11/21/2017  . Cerebrovascular accident (CVA) due to thrombosis of right middle cerebral artery ([place])    . Acute deep vein thrombosis (DVT) of proximal vein of lower extremity ([name])    . AKI (acute kidney injury) ([place])    . Middle cerebral artery embolism, right s/p endovascular therapy 11/16/2017  . Caregiver stress 10/05/2015  . Hematuria 04/06/2015  . ARF (acute renal failure) ([place]) 07/18/2014  . Cardiogenic shock ([place]) 07/18/2014  . Atrial fibrillation with slow ventricular response ([place])    . NSTEMI (non-ST elevated myocardial  infarction) ([place])    . CHB (complete heart block) - s/p MDT 1 lead PPM 07/17/2014  . Advance care planning 07/03/2014  . Sebaceous cyst 04/11/2013  . Medicare annual wellness visit, subsequent 10/28/2012  . RIB PAIN, RIGHT SIDED 01/02/2009  . CAD, NATIVE VESSEL 11/25/2008  . Unspecified vitamin D deficiency 11/24/2008  . RECENT RET DETACH PARTIAL W/SINGLE DEFECT 11/24/2008  . Essential hypertension, benign 07/21/2008  . HLD (hyperlipidemia) 05/22/2007  . Atrial fibrillation ([place]) 05/22/2007  . GERD 05/22/2007  . GASTRIC ULCER, ACUTE, HEMORRHAGE 05/22/2007  . Hyperglycemia 05/22/2007    Past Medical History:      Past Medical History:  Diagnosis Date  . Anxiety    . Back pain, chronic    . Complete heart block ([place]) 07/18/2014    Medtronic [place] model [id number] (serial number [id number])  singe lead PPM  . Esophageal obstruction due to food impaction 2017  . GERD (gastroesophageal reflux disease) 10/2002  . Hyperlipidemia 12/1994  . Hypertension    . Hypokalemia    . Lung collapse 03/1991    Fall  . Permanent atrial fibrillation ([name])      Refused coumadin therapy    Past Surgical History:       Past Surgical History:  Procedure Laterality Date  . BACK SURGERY   11/91    with hardware fixation  . CARDIAC CATHETERIZATION   06/2004  30 % stenosis, EF normal  . ESOPHAGOGASTRODUODENOSCOPY N/A 01/06/2016    Procedure: ESOPHAGOGASTRODUODENOSCOPY (EGD);  Surgeon: Otis Brace, MD;  Location: [facility];  Service: Gastroenterology;  Laterality: N/A;  . EYE SURGERY        Retinal bubble surgery, cataract   . IR CT HEAD LTD   11/16/2017  . IR PERCUTANEOUS ART THROMBECTOMY/INFUSION INTRACRANIAL INC DIAG ANGIO   11/16/2017  . LEFT HEART CATHETERIZATION WITH CORONARY ANGIOGRAM N/A 07/18/2014    Procedure: LEFT HEART CATHETERIZATION WITH CORONARY ANGIOGRAM;  Surgeon: Jettie Booze, MD; [facility] OK, LAD mild dz, D1 80%, D2 OK, CFX system OK, RCA OK, PDA 100%, med rx  . PERMANENT  PACEMAKER INSERTION N/A 07/18/2014    Procedure: PERMANENT PACEMAKER INSERTION;  Surgeon: Thompson Grayer, MD; Medtronic Dent model [id number] (serial number [id number])    . RADIOLOGY WITH ANESTHESIA N/A 11/16/2017    Procedure: RADIOLOGY WITH ANESTHESIA;  Surgeon: Luanne Bras, MD;  Location: [facility];  Service: Radiology;  Laterality: N/A;  . TEMPORARY PACEMAKER INSERTION N/A 07/18/2014    Procedure: TEMPORARY PACEMAKER INSERTION;  Surgeon: Peter M Martinique, MD;  Location: [facility] CATH LAB;  Service: Cardiovascular;  Laterality: N/A;    Social History:  reports that he quit smoking about 64 years ago. He has a 15.00 pack-year smoking history. His smokeless tobacco use includes chew. He reports that he does not drink alcohol or use drugs.   Family / Support Systems Marital Status: Married Patient Roles: Spouse, Caregiver, Parent Spouse/Significant Other: wife requires 24/7 care from family Children: daughter, [name] @ (H) [phone];  son, Paarth Cropper @ (C) [phone];  son, Abe People @ (C) [phone] and son, Dino Borntreger"  Anticipated Caregiver: Family currently sharing responsibilities for care of their mother and plan to continue this for pt, too, upon d/c. Ability/Limitations of Caregiver: Gwynn retired;  other 3 children are working full-time and one with new job and less daytime availability.   Caregiver Availability: 24/7 Family Dynamics: Son, Lakai, notes that all children (and grandchildren) are very supportive and are sharing in care of pt and his wife.  They are trying to keep their parents in their home for as long as possible.  They have hired a Academic librarian for the [employer].   Social History Preferred language: English Religion: Baptist Cultural Background: NA Read: Yes Write: Yes Employment Status: Retired Freight forwarder Issues: None Guardian/Conservator: None - per MD, pt is not capable of making decisions on his own behalf - defer to children.  Son,  Benjermin, notes that no one has POA of any sort.    Abuse/Neglect Abuse/Neglect Assessment Can Be Completed: Yes Physical Abuse: Denies Verbal Abuse: Denies Sexual Abuse: Denies Exploitation of patient/patient's resources: Denies Self-Neglect: Denies   Emotional Status Pt's affect, behavior adn adjustment status: Pt minimally engaged during interview as son, Davelle, provides majority of information.  Pt does smile and attempt to answer basic questions.  He is A&Ox 4 with me.  He appears relaxed and fatigued.  Denies any significant emotional distress, however, will monitor and refer for neuropsychology as indicated. Recent Psychosocial Issues: Pt has been providing 24/7 care for wife for 10+ yrs per son. Pyschiatric History: None Substance Abuse History: None   Patient / Family Perceptions, Expectations & Goals Pt/Family understanding of illness & functional limitations: Pt aware he has suffered a CVA and is receiving rehab for deficits.  Son and family with good understanding of medical issues, current functional limitations/ need for CIR. Premorbid pt/family roles/activities: As noted,  pt was providing 24/7 physical assistance/ caregiving for his wife PTA.  Pt was completely independent and still driving, tending to gardens and riding tractor over his land. Anticipated changes in roles/activities/participation: Team anticipating need for 24/7 care, therefore, family will need to add in pt's care to what they are already providing for their mother. Pt/family expectations/goals: "He needs to get a lot better than this."  per son, Delmar, regarding readiness for d/c home.   Community Resources Express Scripts: None Premorbid Home Care/DME Agencies: None Transportation available at discharge: yes Resource referrals recommended: Neuropsychology   Discharge Planning Living Arrangements: Spouse/significant other Support Systems: Children, Other relatives Type of Residence: Private  residence Insurance Resources: Multimedia programmer (specify), Chartered certified accountant Resources: Radio broadcast assistant Screen Referred: No Living Expenses: Own Money Management: Patient, Family Does the patient have any problems obtaining your medications?: No Home Management: patient with assist of family prn Patient/Family Preliminary Plans: Pt and family goal is for pt to be able to reach at least a min assistance level and hopeful they can manage care for the two of them. Social Work Anticipated Follow Up Needs: HH/OP Expected length of stay: 21-24 days   Clinical Impression Elderly gentleman here following CVA and son at bedside to assist with assessment interview.  Pt does attempt to answer basic, personal info questions and does not appear to be in any emotional distress.  Pt is oriented x 4 with me.  Son notes that family very eager to see pt reach a min assist level or better as they will now be providing care to both pt and their mother (who also requires 24/7 assistance.)  SW to follow for support and d/c planning needs.   Shanedra Lave 11/23/2017, 3:06 PM

## 2017-12-05 NOTE — Progress Notes (Signed)
[facility] PHYSICAL MEDICINE & REHABILITATION     PROGRESS NOTE  Subjective/Complaints:  Patient seen lying in bed this morning working with OT. Patient did not sleep well overnight per sleep chart. Discussed pushing tendencies with OT.  ROS: Unreliable due to cognition.  Objective: Vital Signs: Blood pressure (!) 127/92, pulse (!) 105, temperature 97.7 F (36.5 C), temperature source Axillary, resp. rate 18, height 5\' 7"  (1.702 m), weight 78 kg (171 lb 15.3 oz), SpO2 97 %. No results found. Recent Labs    12/03/17 0459 12/05/17 0625  WBC 11.7* 9.7  HGB 9.2* 10.2*  HCT 28.1* 32.1*  PLT 307 332   Recent Labs    12/05/17 0625  NA 139  K 3.9  CL 102  GLUCOSE 115*  BUN 10  CREATININE 1.01  CALCIUM 8.8*   CBG (last 3)  Recent Labs    12/03/17 0747 12/04/17 0745  GLUCAP 112* 100*    Wt Readings from Last 3 Encounters:  12/05/17 78 kg (171 lb 15.3 oz)  12/04/17 78.1 kg (172 lb 2.9 oz)  11/21/17 81.7 kg (180 lb 1.9 oz)    Physical Exam:  BP (!) 127/92 (BP Location: Left Arm)   Pulse (!) 105   Temp 97.7 F (36.5 C) (Axillary)   Resp 18   Ht 5\' 7"  (1.702 m)   Wt 78 kg (171 lb 15.3 oz)   SpO2 97%   BMI 26.93 kg/m  Constitutional: He appears well-developed. He appears cachectic. NAD. HENT: Normocephalic and atraumatic.  Eyes: EOM are normal. No discharge.  Cardiovascular: irregularly irregular Respiratory: Effort normal and breath sounds normal.  GI: Soft. Bowel sounds are normal.  Musculoskeletal: No edema or tenderness in extremities  Neurological: He is alert  Dysarthric speech Oriented 1 Motor: Right upper extremity: 5/5 proximal distal Left upper extremity: 4+/5 proximal to distal Bilateral lower extremities: Hip flexion 3+/5, knee extension 3/5, ankle dorsiflexion 4+/5 (?participation) Left inattention improving Skin: Skin is warm and dry.  Psychiatric: His speech is slurred. Cognition and memory are impaired.   Assessment/Plan: 1. Functional  deficits secondary to right infarct with GI bleed which require 3+ hours per day of interdisciplinary therapy in a comprehensive inpatient rehab setting. Physiatrist is providing close team supervision and 24 hour management of active medical problems listed below. Physiatrist and rehab team continue to assess barriers to discharge/monitor patient progress toward functional and medical goals.  Function:  Bathing Bathing position   Position: Sitting EOB  Bathing parts Body parts bathed by patient: Chest, Abdomen, Right arm, Front perineal area Body parts bathed by helper: Left arm, Buttocks, Right upper leg, Left upper leg, Left lower leg, Right lower leg, Back  Bathing assist Assist Level: (max A)      Upper Body Dressing/Undressing Upper body dressing   What is the patient wearing?: Pull over shirt/dress     Pull over shirt/dress - Perfomed by patient: Put head through opening Pull over shirt/dress - Perfomed by helper: Thread/unthread right sleeve, Thread/unthread left sleeve, Pull shirt over trunk        Upper body assist Assist Level: (max A)      Lower Body Dressing/Undressing Lower body dressing   What is the patient wearing?: Non-skid slipper socks, Pants       Pants- Performed by helper: Thread/unthread right pants leg, Thread/unthread left pants leg, Pull pants up/down   Non-skid slipper socks- Performed by helper: Don/doff right sock, Don/doff left sock  Lower body assist Assist for lower body dressing: (total A)      Toileting Toileting Toileting activity did not occur: No continent bowel/bladder event        Toileting assist     Transfers Chair/bed transfer   Chair/bed transfer method: Other Chair/bed transfer assist level: 2 helpers Chair/bed transfer assistive device: Mechanical lift Mechanical lift: Technical brewer Comprehension Comprehension assist level:  Understands basic 50 - 74% of the time/ requires cueing 25 - 49% of the time  Expression Expression assist level: Expresses basic 25 - 49% of the time/requires cueing 50 - 75% of the time. Uses single words/gestures.  Social Interaction Social Interaction assist level: Interacts appropriately 25 - 49% of time - Needs frequent redirection.  Problem Solving Problem solving assist level: Solves basic 25 - 49% of the time - needs direction more than half the time to initiate, plan or complete simple activities  Memory Memory assist level: Recognizes or recalls less than 25% of the time/requires cueing greater than 75% of the time    Medical Problem List and Plan: 1.  Right lean, left-sided weakness,dysphagia, cognitive deficits and difficulty standing secondary to right MCA infarct with GI bleed.  Begin CIR 2.  DVT Prophylaxis/Anticoagulation: Mechanical:  Antiembolism stockings, knee (TED hose) Bilateral lower extremities 3. Pain Management: tylenol prn 4. Mood: LCSW to follow for evaluation and support as needed.  5. Neuropsych: This patient is not capable of making decisions on his own behalf. 6. Skin/Wound Care: routine pressure relief measures.  7. Fluids/Electrolytes/Nutrition: Monitor I/O.   BMP within acceptable range on 8/6 8. ABLA due to duodenal ulcer/GIB:   Monitor H/H serial checks.   Increased protonix to 40 mg bid.  Hemoglobin 10.2 on 8/6 9.  Chronic A fib: Monitor HR bid. No anticoagulation for now. No medications due to hypotension.  10. Delirium/sundowning: Continue Depakote tid with Seroquel at bedtime.   Will make adjustments as needed 11. Constipation  Increased senna to bid.   12. Prediabetes  Monitor with increased mobility 13. Hypoalbuminemia  Supplement initiated on 8/6  LOS (Days) 1 A FACE TO FACE EVALUATION WAS PERFORMED  Ankit [name] 12/05/2017 8:31 AM

## 2017-12-05 NOTE — Progress Notes (Addendum)
As the shift progressed, patient became confused. He did not go to sleep until approximately 2300. Prior to this time, he was noted trying to get out of bed & taking off his SCDs. Patient called on the call bell a few times but couldn't be fully understood. He has slurred speech & pockets his meds on the right side of his cheek. He takes his medications whole with puree, but mouth had to be checked for pocketing & had to be instructed to swallow numerous times before he did. He thought his thickener was a cup & spit into the top of it when given his mouthwash. All of this happened after he was given his seroquel at approximately 2045. His heart beat is irregular, pacemaker noted to the left upper chest. IV site noted to the right wrist/FA area with a posey sleeve on. He asked to use the bathroom to have a BM. The stedy was used to transfer him to the bedside commode. He needed cuing to place his hands & feet in the appropriate spots & to leave them there. 2 assist needed. Stools are still very dark as reported by nurse tech. He was noted to be malpositioned in bed at approximately 0100 & was checked for incontinence & repositioned. His condom catheter had come off & his bed needed to be changed. He is sleeping at this time. No acute distress noted. Will continue to monitor, no c/o pain or discomfort. Bed alarm is on & room is in front of the nurses station for visualization.

## 2017-12-05 NOTE — Plan of Care (Signed)
  Problem: Consults Goal: RH STROKE PATIENT EDUCATION Description See Patient Education module for education specifics  Outcome: Progressing Goal: Nutrition Consult-if indicated Outcome: Progressing   Problem: RH BOWEL ELIMINATION Goal: RH STG MANAGE BOWEL W/MEDICATION W/ASSISTANCE Description STG Manage Bowel with Medication with  [place].  Outcome: Progressing Flowsheets (Taken 12/05/2017 1323) STG: Pt will manage bowels with medication with assistance: 4-Minimal assistance   Problem: RH BLADDER ELIMINATION Goal: RH STG MANAGE BLADDER WITH ASSISTANCE Description STG Manage Bladder With  Min Assistance  Outcome: Progressing Flowsheets (Taken 12/05/2017 1323) STG: Pt will manage bladder with assistance: 1-Total assistance   Problem: RH SKIN INTEGRITY Goal: RH STG SKIN FREE OF INFECTION/BREAKDOWN Description Patient and family will be able to verbalize understanding of preventing breakdown   Outcome: Progressing Goal: RH STG MAINTAIN SKIN INTEGRITY WITH ASSISTANCE Description STG Maintain Skin Integrity With  [place].  Outcome: Progressing Flowsheets (Taken 12/05/2017 1323) STG: Maintain skin integrity with assistance: 3-Moderate assistance   Problem: RH SAFETY Goal: RH STG ADHERE TO SAFETY PRECAUTIONS W/ASSISTANCE/DEVICE Description STG Adhere to Safety Precautions With  Min Assistance/Device.  Outcome: Progressing Flowsheets (Taken 12/05/2017 1323) STG:Pt will adhere to safety precautions with assistance/device: 3-Moderate assistance Goal: RH STG DECREASED RISK OF FALL WITH ASSISTANCE Description STG Decreased Risk of Fall With  [employer].  Outcome: Progressing Flowsheets (Taken 12/05/2017 1323) [id number] risk of fall  with assistance/device: 3-Moderate assistance Goal: RH STG DEMO UNDERSTANDING HOME SAFETY PRECAUTIONS Outcome: Progressing   Problem: RH COGNITION-NURSING Goal: RH STG USES MEMORY AIDS/STRATEGIES W/ASSIST TO PROBLEM  SOLVE Description STG Uses Memory Aids/Strategies With min  Assistance to Problem Solve.  Outcome: Progressing Flowsheets (Taken 12/05/2017 1323) STG: Uses memory aids/strategies with assistance: 4-Minimal assistance   Problem: RH PAIN MANAGEMENT Goal: RH STG PAIN MANAGED AT OR BELOW PT'S PAIN GOAL Description Less than 3   Outcome: Progressing   Problem: RH KNOWLEDGE DEFICIT Goal: RH STG INCREASE KNOWLEDGE OF DYSPHAGIA/FLUID INTAKE Description Patient and family will verbalize understanding of prevention of aspiration prior to discharge.  Outcome: Progressing

## 2017-12-05 NOTE — Evaluation (Signed)
Speech Language Pathology Assessment and Plan  Patient Details  Name: Dakota [name] MRN: [id number] Date of Birth: [birth year]  SLP Diagnosis: Dysarthria;Cognitive Impairments  Rehab Potential: Fair ELOS: 21-24 days     Today's Date: 12/05/2017 SLP Individual Time: 1030-1130 SLP Individual Time Calculation (min): 60 min   Problem List:  Patient Active Problem List   Diagnosis Date Noted  . History of GI bleed   . Acute blood loss anemia   . Delirium   . GIB (gastrointestinal bleeding) 11/24/2017  . Hypotension 11/24/2017  . Hypovolemic shock ([place]) 11/24/2017  . Acute lower UTI   . Leucocytosis   . Hypoalbuminemia due to protein-calorie malnutrition ([place])   . Prediabetes   . Prerenal azotemia   . Subacute delirium 11/21/2017  . Dysphagia due to recent cerebrovascular accident 11/21/2017  . Chewing tobacco nicotine dependence 11/21/2017  . Leukocytosis 11/21/2017  . Acute ischemic right MCA stroke ([place]) 11/21/2017  . Cerebrovascular accident (CVA) due to thrombosis of right middle cerebral artery ([place])   . Acute deep vein thrombosis (DVT) of proximal vein of lower extremity ([name])   . AKI (acute kidney injury) ([place])   . Middle cerebral artery embolism, right s/p endovascular therapy 11/16/2017  . Caregiver stress 10/05/2015  . Hematuria 04/06/2015  . ARF (acute renal failure) ([place]) 07/18/2014  . Cardiogenic shock ([place]) 07/18/2014  . Atrial fibrillation with slow ventricular response ([place])   . NSTEMI (non-ST elevated myocardial infarction) ([place])   . CHB (complete heart block) - s/p MDT 1 lead PPM 07/17/2014  . Advance care planning 07/03/2014  . Sebaceous cyst 04/11/2013  . Medicare annual wellness visit, subsequent 10/28/2012  . RIB PAIN, RIGHT SIDED 01/02/2009  . CAD, NATIVE VESSEL 11/25/2008  . Unspecified vitamin D deficiency 11/24/2008  . RECENT RET DETACH PARTIAL W/SINGLE DEFECT 11/24/2008  . Essential hypertension, benign 07/21/2008  . HLD (hyperlipidemia)  05/22/2007  . Atrial fibrillation ([place]) 05/22/2007  . GERD 05/22/2007  . GASTRIC ULCER, ACUTE, HEMORRHAGE 05/22/2007  . Hyperglycemia 05/22/2007   Past Medical History:  Past Medical History:  Diagnosis Date  . Anxiety   . Back pain, chronic   . Complete heart block ([place]) 07/18/2014   Medtronic [place] model [id number] (serial number [id number])  singe lead PPM  . Esophageal obstruction due to food impaction 2017  . GERD (gastroesophageal reflux disease) 10/2002  . Hyperlipidemia 12/1994  . Hypertension   . Hypokalemia   . Lung collapse 03/1991   Fall  . Permanent atrial fibrillation ([name])    Refused coumadin therapy   Past Surgical History:  Past Surgical History:  Procedure Laterality Date  . BACK SURGERY  11/91   with hardware fixation  . BIOPSY  11/27/2017   Procedure: BIOPSY;  Surgeon: Laurence Spates, MD;  Location: [place];  Service: Endoscopy;;  . CARDIAC CATHETERIZATION  06/2004   30 % stenosis, EF normal  . ESOPHAGOGASTRODUODENOSCOPY N/A 01/06/2016   Procedure: ESOPHAGOGASTRODUODENOSCOPY (EGD);  Surgeon: Otis Brace, MD;  Location: [place];  Service: Gastroenterology;  Laterality: N/A;  . ESOPHAGOGASTRODUODENOSCOPY N/A 11/27/2017   Procedure: ESOPHAGOGASTRODUODENOSCOPY (EGD);  Surgeon: Laurence Spates, MD;  Location: [facility] ENDOSCOPY;  Service: Endoscopy;  Laterality: N/A;  . EYE SURGERY     Retinal bubble surgery, cataract   . IR CT HEAD LTD  11/16/2017  . IR PERCUTANEOUS ART THROMBECTOMY/INFUSION INTRACRANIAL INC DIAG ANGIO  11/16/2017  . LEFT HEART CATHETERIZATION WITH CORONARY ANGIOGRAM N/A 07/18/2014   Procedure: LEFT HEART CATHETERIZATION WITH CORONARY ANGIOGRAM;  Surgeon: Jettie Booze,  MD; Dorene Ar OK, LAD mild dz, D1 80%, D2 OK, CFX system OK, RCA OK, PDA 100%, med rx  . PERMANENT PACEMAKER INSERTION N/A 07/18/2014   Procedure: PERMANENT PACEMAKER INSERTION;  Surgeon: Thompson Grayer, MD; Medtronic [place] model [id number] (serial number [id number])    . RADIOLOGY  WITH ANESTHESIA N/A 11/16/2017   Procedure: RADIOLOGY WITH ANESTHESIA;  Surgeon: Luanne Bras, MD;  Location: [place];  Service: Radiology;  Laterality: N/A;  . TEMPORARY PACEMAKER INSERTION N/A 07/18/2014   Procedure: TEMPORARY PACEMAKER INSERTION;  Surgeon: Peter M Martinique, MD;  Location: [facility] CATH LAB;  Service: Cardiovascular;  Laterality: N/A;    Assessment / Plan / Recommendation Clinical Impression Dakota [name] is an 84 year old male with history of HTN, CHB s/p PPM, Afib, GERD/Schatzki's ring;who was admitted on 11/16/2017 with left-sided weakness, right gaze preference, slurred speech and mental status changes. CT of head done showing right basal ganglia infarct with concerns of distal right MCA thrombus. She underwent cerebral angiogram with revascularization of right MCA M2 segment by Dr. Estanislado Pandy.Patient continued to have issues with confusion and delirium and repeat CT head on 7/19 showed propagation of acute right MCA territory infarct involving right insula and right temporoparietal lobe and dense right insular MCA consistent with thromboembolism.   Dr.Xufelt that stroke was embolic due to A. fib and patient to start Eliquis on hospital day 5. Patient had issues with delirium sleep-wake sleep-wake cycle disruption agitation despite Seroquel. Depakote was added at discharge. Patient was cleared to start Eliquis at [facility] 11/21/17.Morning dose Seroquel was DC'd to prevent daytime sedation and sleep-wake cycle has been monitored. Due to evidence of progressive leukocytosis;chest x-ray and UA/UCS were ordered to rule out infection as cause of delirium. Chest x-ray was negative for acute changes. UA showed evidence of UTI therefore Bactrim was added for treatment on 726. IV fluids were changed to at bedtime and nutritional supplements were ordered to help with p.o. intake. Patient has had fluctuating mental status with extremely poor p.o. intake therefore calorie count  was initiated and concerns about need for alternate source of nutrition was discussed briefly with family.   Follow up labs past admission showed persistent leucocytosis with mild drop in H/H.He has had issues with constipation treated with suppository and enema on p.m. of 7/25. Frequent stooling was reported and as well as concerns of tarry stools. Stool guaiac was checked and noted to be positive.Later that afternoon patient reported abdominal lower abdominal pain and was found to have maroon appearing stools. Stat CBC, Lytesand type and cross were ordered for workup. He was noted to be hypotensive with SBP in 80's. He was treated withfluidboluswith improvement in SBP to 90-100. He was noted to be lethargic and poorly responsive despiteIV fluids.Eliquis was discontinued. Dr. Leandrew Koyanagi istwas consulted for assistance and transfer. Dr. Oletta Lamas was consulted to evaluated patient for work up of GIB. Patient was in need of closer monitoringand was discharged to a stepdown uniton 11/24/2017  EGD performed 7/29 revealed duodenal ulcer as well as gastritis and esophagitis. Placed on Protonix and re bled again when anticoagulation restarted. Now placed on hold. Dr. Oletta Lamas, GI. Feels patient should remain off anticoagulation for at least 2 to 3 weeks if not indefinitely.  Pt presents with severe cognitive impairments, deficits include reduced sustained attention, reduced intellectual awareness, reduced short term recall, fluctuating orientation, left inattention with right gaze preference impacting higher level processes.  Pt presents with severe-moderate swallow function, deficits include overall oral control, left side oral motor weakness, oral  holding, left side pocketing and suspect delayed of swallow initiation on dys 1 textures and required secondary dys 1 bolus for oral clearance of dys 2. Pt demonstrated clear vocal quality and no overt s/s aspiration on NTL via tsp, cup and thin  via tsp, however immediate cough noted with cup sips of thin. SLP recommends NTL via tsp and dys 1 with medication crushed in puree. Pt demonstrates low vocal intensity impacting intelligibility at word and phrase level with reduced awareness. Pt's son reports periods of clear speech, however not noted in session.  Pt would benefit from skilled ST services in order to maximize functional independence and reduce burden of care requiring 24/hours supervision and - ST services at next level of care upon returning home.    Skilled Therapeutic Interventions          Skilled ST services focused on swallow skills. SLP facilitated PO consumption trials, pt required max-mod A verbal cues to utilize Tsp for NTL, initially attempting to consume via cup. Pt's son reported allowing pt to consume NTl via cup and no s/s aspiration, SLP provided education of silent aspiration on MBS with need for TSP only. Pt's son stated understanding. Pt was left in room with call bell within reach and chair alaram set.ST reccomends to continue skilled ST services.   SLP Assessment  Patient will need skilled Speech Lanaguage Pathology Services during CIR admission    Recommendations  SLP Diet Recommendations: Dysphagia 1 (Puree);Nectar Liquid Administration via: Spoon Medication Administration: Crushed with puree Supervision: Staff to assist with self feeding Compensations: Slow rate;Small sips/bites;Minimize environmental distractions Postural Changes and/or Swallow Maneuvers: Seated upright 90 degrees;Upright 30-60 min after meal Oral Care Recommendations: Oral care QID Patient destination: Home Follow up Recommendations: Home Health SLP;24 hour supervision/assistance;Skilled Nursing facility Equipment Recommended: None recommended by SLP    SLP Frequency 3 to 5 out of 7 days   SLP Duration  SLP Intensity  SLP Treatment/Interventions 21-24 days   Minumum of 1-2 x/day, 30 to 90 minutes  Cognitive  remediation/compensation;Cueing hierarchy;Dysphagia/aspiration precaution training;Functional tasks;Environmental controls;Internal/external aids;Patient/family education    Pain Pain Assessment Pain Score: 0-No pain  Prior Functioning Cognitive/Linguistic Baseline: Within functional limits Type of Home: House  Lives With: Spouse;Family Available Help at Discharge: Family;Available 24 hours/day Vocation: Retired  Function:  Eating Eating   Modified Consistency Diet: Yes Eating Assist Level: Supervision or verbal cues;Helper checks for pocketed food;Helper feeds patient;Helper scoops food on utensil   Eating Set Up Assist For: Opening containers;Cutting food       Cognition Comprehension Comprehension assist level: Understands basic 50 - 74% of the time/ requires cueing 25 - 49% of the time  Expression   Expression assist level: Expresses basic 25 - 49% of the time/requires cueing 50 - 75% of the time. Uses single words/gestures.  Social Interaction Social Interaction assist level: Interacts appropriately 25 - 49% of time - Needs frequent redirection.  Problem Solving Problem solving assist level: Solves basic 25 - 49% of the time - needs direction more than half the time to initiate, plan or complete simple activities  Memory Memory assist level: Recognizes or recalls less than 25% of the time/requires cueing greater than 75% of the time   Short Term Goals: Week 1: SLP Short Term Goal 1 (Week 1): Pt will consume dys 1 textures and nectar thick liquids via teaspoon with max assist multimodal cues and minimal overt s/s of aspiration.  SLP Short Term Goal 2 (Week 1): Pt will utilize external aids  to reorient to place, date, and situation with min assist multimodal cues.  SLP Short Term Goal 3 (Week 1): Pt will sustain his attention to basic, familiar tasks for ~5 minute intervals with max assist multimodal cues.  SLP Short Term Goal 4 (Week 1): Pt will slow rate and increase vocal  intensity to achieve intelligibility at the word level with mod assist.   SLP Short Term Goal 5 (Week 1): Pt will locate items to the left of midline during basic familiar tasks in ~50% of opportunities with max assist multimodal cues.  SLP Short Term Goal 6 (Week 1): Pt will consume trials of thin via TSP with min overt s/s aspiration in order to indicate readiness for instrumental swallow assessment.  Refer to Care Plan for Long Term Goals  Recommendations for other services: None   Discharge Criteria: Patient will be discharged from SLP if patient refuses treatment 3 consecutive times without medical reason, if treatment goals not met, if there is a change in medical status, if patient makes no progress towards goals or if patient is discharged from hospital.  The above assessment, treatment plan, treatment alternatives and goals were discussed and mutually agreed upon: by family  Liviana Mills  [facility] 12/05/2017, 4:31 PM

## 2017-12-05 NOTE — Progress Notes (Signed)
Patient information reviewed and entered into eRehab system by Daiva Nakayama, RN, CRRN, [place] Coordinator.  Information including medical coding and functional independence measure will be reviewed and updated through discharge.     Per nursing patient was given "Data Collection Information Summary for Patients in Inpatient Rehabilitation Facilities with attached "Privacy Act [place] Records" upon admission.  Dakota [name] is returning to CIR unit after being transferred to acute services for medical management of GI bleed.

## 2017-12-05 NOTE — Evaluation (Signed)
Physical Therapy Assessment and Plan  Patient Details  Name: Dakota [name] MRN: [id number] Date of Birth: 19-Jun-1928  PT Diagnosis: Abnormal posture, Abnormality of gait, Cognitive deficits, Coordination disorder, Difficulty walking and Hemiplegia non-dominant Rehab Potential: Good ELOS: 21-24 days    Today's Date: 12/05/2017 PT Individual Time: 0820-0935 PT Individual Time Calculation (min): 75 min    Problem List:  Patient Active Problem List   Diagnosis Date Noted  . History of GI bleed   . Acute blood loss anemia   . Delirium   . GIB (gastrointestinal bleeding) 11/24/2017  . Hypotension 11/24/2017  . Hypovolemic shock ([place]) 11/24/2017  . Acute lower UTI   . Leucocytosis   . Hypoalbuminemia due to protein-calorie malnutrition ([place])   . Prediabetes   . Prerenal azotemia   . Subacute delirium 11/21/2017  . Dysphagia due to recent cerebrovascular accident 11/21/2017  . Chewing tobacco nicotine dependence 11/21/2017  . Leukocytosis 11/21/2017  . Acute ischemic right MCA stroke ([place]) 11/21/2017  . Cerebrovascular accident (CVA) due to thrombosis of right middle cerebral artery ([place])   . Acute deep vein thrombosis (DVT) of proximal vein of lower extremity ([name])   . AKI (acute kidney injury) ([place])   . Middle cerebral artery embolism, right s/p endovascular therapy 11/16/2017  . Caregiver stress 10/05/2015  . Hematuria 04/06/2015  . ARF (acute renal failure) ([place]) 07/18/2014  . Cardiogenic shock ([place]) 07/18/2014  . Atrial fibrillation with slow ventricular response ([place])   . NSTEMI (non-ST elevated myocardial infarction) ([place])   . CHB (complete heart block) - s/p MDT 1 lead PPM 07/17/2014  . Advance care planning 07/03/2014  . Sebaceous cyst 04/11/2013  . Medicare annual wellness visit, subsequent 10/28/2012  . RIB PAIN, RIGHT SIDED 01/02/2009  . CAD, NATIVE VESSEL 11/25/2008  . Unspecified vitamin D deficiency 11/24/2008  . RECENT RET DETACH PARTIAL W/SINGLE DEFECT  11/24/2008  . Essential hypertension, benign 07/21/2008  . HLD (hyperlipidemia) 05/22/2007  . Atrial fibrillation ([place]) 05/22/2007  . GERD 05/22/2007  . GASTRIC ULCER, ACUTE, HEMORRHAGE 05/22/2007  . Hyperglycemia 05/22/2007    Past Medical History:  Past Medical History:  Diagnosis Date  . Anxiety   . Back pain, chronic   . Complete heart block ([place]) 07/18/2014   Medtronic [employer] model [id number] (serial number [id number])  singe lead PPM  . Esophageal obstruction due to food impaction 2017  . GERD (gastroesophageal reflux disease) 10/2002  . Hyperlipidemia 12/1994  . Hypertension   . Hypokalemia   . Lung collapse 03/1991   Fall  . Permanent atrial fibrillation ([name])    Refused coumadin therapy   Past Surgical History:  Past Surgical History:  Procedure Laterality Date  . BACK SURGERY  11/91   with hardware fixation  . BIOPSY  11/27/2017   Procedure: BIOPSY;  Surgeon: Laurence Spates, MD;  Location: [place];  Service: Endoscopy;;  . CARDIAC CATHETERIZATION  06/2004   30 % stenosis, EF normal  . ESOPHAGOGASTRODUODENOSCOPY N/A 01/06/2016   Procedure: ESOPHAGOGASTRODUODENOSCOPY (EGD);  Surgeon: Otis Brace, MD;  Location: [place];  Service: Gastroenterology;  Laterality: N/A;  . ESOPHAGOGASTRODUODENOSCOPY N/A 11/27/2017   Procedure: ESOPHAGOGASTRODUODENOSCOPY (EGD);  Surgeon: Laurence Spates, MD;  [facility] ENDOSCOPY;  Service: Endoscopy;  Laterality: N/A;  . EYE SURGERY     Retinal bubble surgery, cataract   . IR CT HEAD LTD  11/16/2017  . IR PERCUTANEOUS ART THROMBECTOMY/INFUSION INTRACRANIAL INC DIAG ANGIO  11/16/2017  . LEFT HEART CATHETERIZATION WITH CORONARY ANGIOGRAM N/A 07/18/2014   Procedure:  LEFT HEART CATHETERIZATION WITH CORONARY ANGIOGRAM;  Surgeon: Jettie Booze, MD; [facility] [place], LAD mild dz, D1 80%, D2 OK, CFX system OK, RCA OK, PDA 100%, med rx  . PERMANENT PACEMAKER INSERTION N/A 07/18/2014   Procedure: PERMANENT PACEMAKER INSERTION;  Surgeon: Thompson Grayer, MD; Medtronic [place] model [id number] (serial number [id number])    . RADIOLOGY WITH ANESTHESIA N/A 11/16/2017   Procedure: RADIOLOGY WITH ANESTHESIA;  Surgeon: Luanne Bras, MD;  Location: [place];  Service: Radiology;  Laterality: N/A;  . TEMPORARY PACEMAKER INSERTION N/A 07/18/2014   Procedure: TEMPORARY PACEMAKER INSERTION;  Surgeon: Peter M Martinique, MD;  Location: [facility] CATH LAB;  Service: Cardiovascular;  Laterality: N/A;    Assessment & Plan Clinical Impression:  Dakota [name] is an 84 year old male with history of hypertension, complete heart block status post permanent pacemaker, GERD/Schatzki's ring, AF no Coumadin who was admitted on zero 7/18 10/2017 with left-sided weakness, right gaze preference, slurred speech and mental status changes due to right MCA territory infarct.  History taken from chart review and son. He underwent cerebral Angio revascularization of right MCA M2 segment by Dr. Estanislado Pandy.  He continued to have issues with confusion and delirium and repeat CT head on 7/19 reviewed, showing extension of right MCA infarct. Per report, propagation of acute right MCA territory infarct involving right insula and right temporoparietal lobe.  Stroke was felt to be embolic due to A. fib and patient was started on Eliquis 5 mg twice daily at time of admission for inpatient rehab program in 11/21/2017.  Hospital course was significant for sleep-wake disruption with bouts of agitation, poor p.o. intake but activity tolerance was improving.  On 11/24/2017 he developed maroon stools due to GI bleed and was transfused to acute services for closer monitoring.  Serial H&H was recommended by Dr. Oletta Lamas however he continued to have maroonish blood/bloody stools.   He underwent EGD on 11/27/2017 revealing nonsevere gastritis in the lower third of esophagus, moderate inflammation with erosion and erythema in gastric antrum, one partially obstructing nonbleeding cratered duodenal ulcer and  biopsies were taken.  He was placed on clear liquid diet and started on IV Protonix 80 mg daily.  He has been transfused with multiple units packed red blood cells.  He was started on IV heparin on 11/28/2017 however had another episode of bloody stools with drop in Hgb to 7.8. He was transfused with 2 units packed red blood cells and t in the first 30 minutes and do not just be stopping them if the a.m. reated with fluid bolus for hypotension.   Dr. Dr. Oletta Lamas recommended avoiding anticoagulation for some time and repeat EGD only if bleeding reoccurs. Patient had issues with recurrent agitation and Seroquel resumed.  H/H stable and he was started on dysphagia 1, nectar liquids on 8/1.  Mentation and p.o. intake is reported to be improving.  Therapy is resumed and patient continues to be limited by weakness with right lean left-sided weakness and difficulty standing.  CIR was recommended due to deficits in mobility and self-care tasks      Patient currently requires max to +2 with mobility secondary to muscle weakness and muscle joint tightness, impaired timing and sequencing, abnormal tone, unbalanced muscle activation, motor apraxia, decreased coordination and decreased motor planning, decreased midline orientation, decreased attention to left, left side neglect and decreased motor planning, decreased initiation, decreased attention, decreased awareness, decreased problem solving, decreased safety awareness, decreased memory and delayed processing and decreased sitting balance, decreased standing balance, decreased  postural control, hemiplegia and decreased balance strategies.  Prior to hospitalization, patient was independent  with mobility and lived with Spouse, Family in a House home.  Home access is  Ramped entrance.  Patient will benefit from skilled PT intervention to maximize safe functional mobility, minimize fall risk and decrease caregiver burden for planned discharge home with 24 hour  assist.  Anticipate patient will benefit from follow up [facility] at discharge.  PT - End of Session Activity Tolerance: Tolerates 30+ min activity with multiple rests Endurance Deficit: Yes Endurance Deficit Description: lethargic initially during PT eval, appeared to improve throughout PT session  PT Assessment Rehab Potential (ACUTE/IP ONLY): Good PT Barriers to Discharge: Decreased caregiver support PT Barriers to Discharge Comments: level of assist that may be needed as family is already taking care of his wife, who is totalA  PT Patient demonstrates impairments in the following area(s): Balance;Motor;Safety;Endurance;Perception PT Transfers Functional Problem(s): Bed Mobility;Bed to Chair;Car;Furniture PT Locomotion Functional Problem(s): Ambulation;Stairs;Wheelchair Mobility PT Plan PT Intensity: Minimum of 1-2 x/day ,45 to 90 minutes PT Frequency: 5 out of 7 days PT Duration Estimated Length of Stay: 21-24 days  PT Treatment/Interventions: Ambulation/gait training;Cognitive remediation/compensation;Discharge planning;DME/adaptive equipment instruction;Functional mobility training;Neuromuscular re-education;Patient/family education;Stair training;Therapeutic Activities;Therapeutic Exercise;UE/LE Strength taining/ROM;UE/LE Coordination activities;Visual/perceptual remediation/compensation;Wheelchair propulsion/positioning PT Transfers Anticipated Outcome(s): MinA with LRAD  PT Locomotion Anticipated Outcome(s): MinA with LRAD  PT Recommendation Recommendations for Other Services: Neuropsych consult Follow Up Recommendations: Other (comment);24 hour supervision/assistance(TBD pending on how patient progresses and how much family is able to assist in care ) Patient destination: Home Equipment Recommended: 3 in 1 bedside comode;Wheelchair (measurements);Tub/shower bench;To be determined Equipment Details: LRAD TBD as patient progresses   Skilled Therapeutic Intervention  Patient received in  bed, very lethargic but does open eyes and becomes more aware and alert following bed mobility with PT today. He requires MaxA-TotalA for functional bed mobility due to lethargy and continues to demonstrate somewhat of a L lean, R gaze, and L inattention in general. Able to complete functional sit to stand with TotalA initially, able to progress to MaxA as session progressed especially when patient allowed to pull up on bar in [place] and in parallel bars. Used Stedy for stand-pivot transfers today and worked on Administrator, patient with difficulty in maintaining straight line with R hemi technique but does show attempt to use L hand on WC wheel. Able to ambulate approximately 21f with 3 musketeers technique, severe gait and balance deviations noted including scissoring pattern, poor balance with lateral lean, difficulty progressing feet, reduced dorsiflexion, and poor gross proprioception with gait. Otherwise worked on proprioception based tasks in front of mirror today, Mod-max visual cues provided for use of mirror for feedback, patient with difficulty maintaining focus on visual feedback tasks and tasks at parallel bars this morning. Worked on B quad strength and hamstring stretching, patient then [facility] left up in chair with lap tray down, chair alarm activated, and family present, all needs otherwise met this session.   PT Evaluation Precautions/Restrictions Precautions Precautions: Fall Precaution Comments: right gaze, left hemiparesis, left lean /pushing Restrictions Weight Bearing Restrictions: No General Chart Reviewed: Yes Response to Previous Treatment: Patient with no complaints from previous session. Family/Caregiver Present: Yes(son arrived mid session )  Pain Pain Assessment Pain Scale: Faces Pain Score: 0-No pain Faces Pain Scale: No hurt Pain Type: Chronic pain Pain Location: Back Pain Orientation: Lower Pain Descriptors / Indicators:  Aching;Sore Pain Onset: On-going Patients Stated Pain Goal: 0 Pain Intervention(s): Repositioned;Distraction;Ambulation/increased  activity Multiple Pain Sites: No Home Living/Prior Functioning Home Living Available Help at Discharge: Family;Available 24 hours/day Type of Home: House Home Access: Ramped entrance Home Layout: One level Bathroom Shower/Tub: Walk-in shower;Tub/shower unit Bathroom Toilet: Standard Bathroom Accessibility: Yes Additional Comments: pt was primary caregiver for wife who does not walk, pt would transfer her and family assists with bathing  Lives With: Spouse;Family Prior Function Level of Independence: Independent with basic ADLs;Independent with homemaking with ambulation;Independent with gait;Independent with transfers  Able to Take Stairs?: Yes Driving: Yes Vocation: Retired Comments: very independent, 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 Pt has 4 kids who can assist Vision/Perception  Vision - Assessment Eye Alignment: Impaired (comment)(R gaze, requires at least mod cues to look to L ) Ocular Range of Motion: Within Functional Limits Alignment/Gaze Preference: Gaze right;Head turned Additional Comments: can turn head/look to left but very poor attention to L side in general  Perception Perception: Impaired Inattention/Neglect: Does not attend to left visual field Spatial Orientation: poor midline awareness Comments: L neglect  Praxis Praxis: Impaired Praxis Impairment Details: Motor planning;Initiation;Ideation Praxis-Other Comments: requires cues for correct planning, initiation, and sequencing for motor tasks   Cognition Overall Cognitive Status: Impaired/Different from baseline Arousal/Alertness: Lethargic Orientation Level: Oriented to person Attention: Sustained Focused Attention: Appears intact Focused Attention Impairment: Verbal basic;Functional basic Sustained Attention: Appears intact Sustained Attention Impairment: Verbal  basic;Functional basic Memory: Impaired Memory Impairment: Decreased short term memory Decreased Short Term Memory: Verbal basic Awareness: Impaired Awareness Impairment: Intellectual impairment Problem Solving: Impaired Problem Solving Impairment: Verbal basic;Functional basic Executive Function: (all grossly impaired ) Behaviors: Perseveration Safety/Judgment: Impaired Comments: left inattention  Sensation Sensation Light Touch: Appears Intact Hot/Cold: Not tested Proprioception: Impaired by gross assessment Stereognosis: Impaired by gross assessment Coordination Gross Motor Movements are Fluid and Coordinated: No Fine Motor Movements are Fluid and Coordinated: No Coordination and Movement Description: L hemiparesis Finger Nose Finger Test: not tested due to delayed cognition Heel Shin Test: NT Motor  Motor Motor: Hemiplegia;Abnormal postural alignment and control Motor - Skilled Clinical Observations: reduced proprioception and sequencing L LE   Mobility Bed Mobility Bed Mobility: Rolling Right Rolling Right: Total Assistance - Patient < 25% Rolling Left: Total Assistance - Patient < 25% Left Sidelying to Sit: Maximal Assistance - Patient 25-49% Transfers Transfers: Sit to Stand Sit to Stand: Total Assistance - Patient < 25% Stand Pivot Transfers: Total Assistance - Patient < 25% Stand Pivot Transfer Details: Visual cues/gestures for precautions/safety;Tactile cues for sequencing;Tactile cues for initiation;Verbal cues for precautions/safety;Verbal cues for sequencing;Verbal cues for technique Transfer via Lift Equipment: Probation officer Ambulation: Yes Gait Assistance: 2 Holiday representative (Feet): 3 Feet Assistive device: Other (Comment)(3 musketeers system ) Gait Assistance Details: Tactile cues for initiation;Tactile cues for sequencing;Tactile cues for weight shifting;Tactile cues for posture;Tactile cues for placement;Tactile cues for weight  beaing;Visual cues/gestures for precautions/safety;Visual cues/gestures for sequencing;Verbal cues for sequencing;Verbal cues for technique;Verbal cues for precautions/safety;Verbal cues for gait pattern;Manual facilitation for weight shifting;Manual facilitation for weight bearing Gait Gait: Yes Gait Pattern: Impaired Gait Pattern: Step-to pattern;Decreased step length - right;Decreased step length - left;Decreased stride length;Decreased dorsiflexion - right;Decreased dorsiflexion - left;Decreased weight shift to left;Right foot flat;Left foot flat;Right flexed knee in stance;Left flexed knee in stance;Scissoring;Decreased trunk rotation;Trunk flexed;Narrow base of support;Poor foot clearance - left;Poor foot clearance - right Stairs / Additional Locomotion Stairs: No Architect: Yes Wheelchair Assistance: Moderate Assistance - Patient 50 - 74% Wheelchair Propulsion: Right lower extremity;Right upper extremity Distance: 47f  Trunk/Postural Assessment  Cervical Assessment Cervical Assessment: Exceptions to WFL(forward head posture, head turned to R ) Thoracic Assessment Thoracic Assessment: Exceptions to WFL(kyphotic ) Lumbar Assessment Lumbar Assessment: Exceptions to WFL(posterior pelvic tilt ) Postural Control Postural Control: Deficits on evaluation Trunk Control: Tendency for L lean in sitting and standing, tactile/visual/verbal cues to correct  Righting Reactions: severely limited Protective Responses: severely limited  Balance Balance Balance Assessed: Yes Static Sitting Balance Static Sitting - Level of Assistance: 4: Min assist Dynamic Sitting Balance Sitting balance - Comments: ModA during dynamic tasks due to L lean  Static Standing Balance Static Standing - Level of Assistance: 2: Max assist Dynamic Standing Balance Dynamic Standing - Balance Support: Left upper extremity supported;Right upper extremity supported;During functional  activity Dynamic Standing - Level of Assistance: 2: Max assist;1: +1 Total assist Extremity Assessment  RUE Assessment RUE Assessment: Within Functional Limits LUE Assessment LUE Assessment: Exceptions to [facility] Passive Range of Motion (PROM) Comments: WFL LUE Body System: Neuro Brunstrum levels for arm and hand: Arm;Hand Brunstrum level for arm: Stage V Relative Independence from Synergy Brunstrum level for hand: Stage V Independence from basic synergies LUE Tone LUE Tone: Within Functional Limits RLE Assessment RLE Assessment: Within Functional Limits LLE Assessment LLE Assessment: Exceptions to [facility] Passive Range of Motion (PROM) Comments: limited B knee extension due to hamstring limitations  Active Range of Motion (AROM) Comments: generally equal to PROM  General Strength Comments: grossly 3 to 3-/5 knee extension and dorsiflexion, hip flexion grossly 2+/5    See Function Navigator for Current Functional Status.   Refer to Care Plan for Long Term Goals  Recommendations for other services: Neuropsych  Discharge Criteria: Patient will be discharged from PT if patient refuses treatment 3 consecutive times without medical reason, if treatment goals not met, if there is a change in medical status, if patient makes no progress towards goals or if patient is discharged from hospital.  The above assessment, treatment plan, treatment alternatives and goals were discussed and mutually agreed upon: by patient and by family  Deniece Ree PT, DPT, [place]   Pager [phone]

## 2017-12-05 NOTE — Evaluation (Signed)
Occupational Therapy Assessment and Plan  Patient Details  Name: Dakota [name] MRN: [id number] Date of Birth: 05-28-1928  OT Diagnosis: abnormal posture, cognitive deficits, hemiplegia affecting non-dominant side, muscle weakness (generalized) and coordination disorder Rehab Potential: Rehab Potential (ACUTE ONLY): Good ELOS: 21-24 days   Today's Date: 12/05/2017 OT Individual Time: 0705-0800 OT Individual Time Calculation (min): 55 min  and Today's Date: 12/05/2017 OT Missed Time: 20 Minutes Missed Time Reason: Patient fatigue    Problem List:  Patient Active Problem List   Diagnosis Date Noted  . Acute blood loss anemia   . Delirium   . GIB (gastrointestinal bleeding) 11/24/2017  . Hypotension 11/24/2017  . Hypovolemic shock ([place]) 11/24/2017  . Acute lower UTI   . Leucocytosis   . Hypoalbuminemia due to protein-calorie malnutrition ([place])   . Prediabetes   . Prerenal azotemia   . Subacute delirium 11/21/2017  . Dysphagia due to recent cerebrovascular accident 11/21/2017  . Chewing tobacco nicotine dependence 11/21/2017  . Leukocytosis 11/21/2017  . Acute ischemic right MCA stroke ([place]) 11/21/2017  . Cerebrovascular accident (CVA) due to thrombosis of right middle cerebral artery ([place])   . Acute deep vein thrombosis (DVT) of proximal vein of lower extremity ([name])   . AKI (acute kidney injury) ([place])   . Middle cerebral artery embolism, right s/p endovascular therapy 11/16/2017  . Caregiver stress 10/05/2015  . Hematuria 04/06/2015  . ARF (acute renal failure) ([place]) 07/18/2014  . Cardiogenic shock ([place]) 07/18/2014  . Atrial fibrillation with slow ventricular response ([place])   . NSTEMI (non-ST elevated myocardial infarction) ([place])   . CHB (complete heart block) - s/p MDT 1 lead PPM 07/17/2014  . Advance care planning 07/03/2014  . Sebaceous cyst 04/11/2013  . Medicare annual wellness visit, subsequent 10/28/2012  . RIB PAIN, RIGHT SIDED 01/02/2009  . CAD, NATIVE VESSEL  11/25/2008  . Unspecified vitamin D deficiency 11/24/2008  . RECENT RET DETACH PARTIAL W/SINGLE DEFECT 11/24/2008  . Essential hypertension, benign 07/21/2008  . HLD (hyperlipidemia) 05/22/2007  . Atrial fibrillation ([place]) 05/22/2007  . GERD 05/22/2007  . GASTRIC ULCER, ACUTE, HEMORRHAGE 05/22/2007  . Hyperglycemia 05/22/2007    Past Medical History:  Past Medical History:  Diagnosis Date  . Anxiety   . Back pain, chronic   . Complete heart block ([place]) 07/18/2014   Medtronic Victor model [id number] (serial number [id number])  singe lead PPM  . Esophageal obstruction due to food impaction 2017  . GERD (gastroesophageal reflux disease) 10/2002  . Hyperlipidemia 12/1994  . Hypertension   . Hypokalemia   . Lung collapse 03/1991   Fall  . Permanent atrial fibrillation ([name])    Refused coumadin therapy   Past Surgical History:  Past Surgical History:  Procedure Laterality Date  . BACK SURGERY  11/91   with hardware fixation  . BIOPSY  11/27/2017   Procedure: BIOPSY;  Surgeon: Laurence Spates, MD;  Location: [facility];  Service: Endoscopy;;  . CARDIAC CATHETERIZATION  06/2004   30 % stenosis, EF normal  . ESOPHAGOGASTRODUODENOSCOPY N/A 01/06/2016   Procedure: ESOPHAGOGASTRODUODENOSCOPY (EGD);  Surgeon: Otis Brace, MD;  Location: [facility];  Service: Gastroenterology;  Laterality: N/A;  . ESOPHAGOGASTRODUODENOSCOPY N/A 11/27/2017   Procedure: ESOPHAGOGASTRODUODENOSCOPY (EGD);  Surgeon: Laurence Spates, MD;  Location: [facility] ENDOSCOPY;  Service: Endoscopy;  Laterality: N/A;  . EYE SURGERY     Retinal bubble surgery, cataract   . IR CT HEAD LTD  11/16/2017  . IR PERCUTANEOUS ART THROMBECTOMY/INFUSION INTRACRANIAL INC DIAG ANGIO  11/16/2017  .  LEFT HEART CATHETERIZATION WITH CORONARY ANGIOGRAM N/A 07/18/2014   Procedure: LEFT HEART CATHETERIZATION WITH CORONARY ANGIOGRAM;  Surgeon: Jettie Booze, MD; [facility] [place], LAD mild dz, D1 80%, D2 OK, CFX system OK, RCA OK, PDA 100%, med rx  .  PERMANENT PACEMAKER INSERTION N/A 07/18/2014   Procedure: PERMANENT PACEMAKER INSERTION;  Surgeon: Thompson Grayer, MD; Medtronic [place] model [phone] (serial number [id number])    . RADIOLOGY WITH ANESTHESIA N/A 11/16/2017   Procedure: RADIOLOGY WITH ANESTHESIA;  Surgeon: Luanne Bras, MD;  Location: [facility];  Service: Radiology;  Laterality: N/A;  . TEMPORARY PACEMAKER INSERTION N/A 07/18/2014   Procedure: TEMPORARY PACEMAKER INSERTION;  Surgeon: Peter M Martinique, MD;  Location: [facility] CATH LAB;  Service: Cardiovascular;  Laterality: N/A;    Assessment & Plan Clinical Impression: Patient is a 84 y.o. year old male with history of hypertension, complete heart block status post permanent pacemaker, GERD/Schatzki's ring, AF no Coumadin who was admitted on zero 7/18 10/2017 with left-sided weakness, right gaze preference, slurred speech and mental status changes due to right MCA territory infarct.  History taken from chart review and son. He underwent cerebral Angio revascularization of right MCA M2 segment by Dr. Estanislado Pandy.  He continued to have issues with confusion and delirium and repeat CT head on 7/19 reviewed, showing extension of right MCA infarct. Per report, propagation of acute right MCA territory infarct involving right insula and right temporoparietal lobe.  Stroke was felt to be embolic due to A. fib and patient was started on Eliquis 5 mg twice daily at time of admission for inpatient rehab program in 11/21/2017.  Hospital course was significant for sleep-wake disruption with bouts of agitation, poor p.o. intake but activity tolerance was improving.  On 11/24/2017 he developed maroon stools due to GI bleed and was transfused to acute services for closer monitoring.  Serial H&H was recommended by Dr. Oletta Lamas however he continued to have maroonish blood/bloody stools.   He underwent EGD on 11/27/2017 revealing nonsevere gastritis in the lower third of esophagus, moderate inflammation with erosion and  erythema in gastric antrum, one partially obstructing nonbleeding cratered duodenal ulcer and biopsies were taken.  He was placed on clear liquid diet and started on IV Protonix 80 mg daily.  He has been transfused with multiple units packed red blood cells.  He was started on IV heparin on 11/28/2017 however had another episode of bloody stools with drop in Hgb to 7.8. He was transfused with 2 units packed red blood cells and t in the first 30 minutes and do not just be stopping them if the a.m. reated with fluid bolus for hypotension.   Dr. Dr. Oletta Lamas recommended avoiding anticoagulation for some time and repeat EGD only if bleeding reoccurs. Patient had issues with recurrent agitation and Seroquel resumed.  H/H stable and he was started on dysphagia 1, nectar liquids on 8/1.  Mentation and p.o. intake is reported to be improving.  Therapy is resumed and patient continues to be limited by weakness with right lean left-sided weakness and difficulty standing.Patient transferred to CIR on 12/04/2017 .    Patient currently requires max - total A with basic self-care skills secondary to muscle weakness, decreased cardiorespiratoy endurance, decreased coordination and decreased motor planning, L inattention, decreased midline orientation, decreased attention, decreased awareness, decreased problem solving, decreased safety awareness and delayed processing and decreased sitting balance, decreased standing balance, decreased postural control, hemiplegia and decreased balance strategies.  Prior to hospitalization, patient could complete ADLs  with independent .  Patient will benefit from skilled intervention to decrease level of assist with basic self-care skills prior to discharge home with care partner.  Anticipate patient will require 24 hour supervision and minimal physical assistance and follow up home health.  OT - End of Session Activity Tolerance: Decreased this session Endurance Deficit: Yes Endurance  Deficit Description: Pt fell asleep during the eval- lethargic OT Assessment Rehab Potential (ACUTE ONLY): Good OT Patient demonstrates impairments in the following area(s): Balance;Cognition;Endurance;Safety;Perception;Vision;Sensory OT Basic ADL's Functional Problem(s): Eating;Grooming;Bathing;Dressing;Toileting OT Transfers Functional Problem(s): Tub/Shower;Toilet OT Additional Impairment(s): Fuctional Use of Upper Extremity OT Plan OT Intensity: Minimum of 1-2 x/day, 45 to 90 minutes OT Frequency: 5 out of 7 days OT Duration/Estimated Length of Stay: 21-24 days OT Treatment/Interventions: Balance/vestibular training;Cognitive remediation/compensation;Discharge planning;DME/adaptive equipment instruction;Functional mobility training;Neuromuscular re-education;Patient/family education;Psychosocial support;Self Care/advanced ADL retraining;Therapeutic Activities;Therapeutic Exercise;UE/LE Strength taining/ROM;UE/LE Coordination activities;Visual/perceptual remediation/compensation OT Self Feeding Anticipated Outcome(s): supervision/set up OT Basic Self-Care Anticipated Outcome(s): min A OT Toileting Anticipated Outcome(s): min A OT Bathroom Transfers Anticipated Outcome(s): min A OT Recommendation Recommendations for Other Services: Other (comment)(none at this time) Patient destination: Home Follow Up Recommendations: Home health OT Equipment Recommended: To be determined   Skilled Therapeutic Intervention Upon entering the room, pt supine in bed and sleeping soundly. Pt was lethargic and difficult to remain alert during large portion of initial evaluation. OT educated pt on OT purpose, POC, and goals with pt verbalizing understanding. Pt bathing and dressing from seated position on EOB with min - max A for dynamic sitting balance during self care tasks. Pt able to functionally hold wash cloth with L UE but does not attend or realize when cloth has fallen from hand. Pt standing from bed  with max A and pushing heavily to the L side. OT utilized stedy to stand and don LB clothing items with max multimodal cues for sequencing of task. Pt impulsively attempting to stand multiple time while seated in STEDY and continuing to push to the L while seated as well. Pt returning to bed at end of session and pt falling asleep before therapist is able to exit room. Bed alarm activated and call bell within reach.   OT Evaluation Precautions/Restrictions  Precautions Precautions: Fall Precaution Comments: right gaze, left hemiparesis, left lean /pushing Restrictions Weight Bearing Restrictions: No General OT Amount of Missed Time: 20 Minutes Vital Signs Therapy Vitals Temp: 97.7 F (36.5 C) Temp Source: Axillary Pulse Rate: (!) 105 Resp: 18 BP: (!) 127/92 Patient Position (if appropriate): Lying Oxygen Therapy SpO2: 97 % O2 Device: Room Air Pain Pain Assessment Pain Scale: Faces Pain Score: 0-No pain Faces Pain Scale: No hurt Home Living/Prior Functioning Home Living Family/patient expects to be discharged to:: Other (Comment)(unsure) Living Arrangements: Other (Comment)(undecided) Available Help at Discharge: Family, Available 24 hours/day Type of Home: House Home Access: Ramped entrance Home Layout: One level Bathroom Shower/Tub: Gaffer, Chiropodist: Standard Bathroom Accessibility: Yes Additional Comments: pt was primary caregiver for wife who does not walk, pt would transfer her and family assists with bathing  Lives With: Spouse Prior Function Level of Independence: Independent with basic ADLs, Independent with homemaking with ambulation, Independent with gait, Independent with transfers  Able to Take Stairs?: Yes Driving: Yes Vocation: Retired Comments: very independent, 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 Pt has 4 kids who can assist Vision Baseline Vision/History: No visual deficits Patient Visual Report: No change from  baseline Vision Assessment?: Vision impaired- to be further tested in functional context Additional Comments: able to  scan to L but poor visual attention Perception  Inattention/Neglect: Does not attend to left visual field Spatial Orientation: poor midline awareness Praxis Praxis: Impaired Praxis Impairment Details: Motor planning Cognition Overall Cognitive Status: Impaired/Different from baseline Arousal/Alertness: Lethargic Orientation Level: Person Person: Oriented Place: Disoriented Situation: Disoriented Year: (does not answer) Month: (" I dont know") Day of Week: Incorrect Memory: Impaired Memory Impairment: Decreased short term memory Immediate Memory Recall: Sock;Blue;Bed Memory Recall: Blue(1/3) Memory Recall Blue: With Cue Sensation Sensation Light Touch: Appears Intact Hot/Cold: Not tested Proprioception: Not tested Stereognosis: Not tested Coordination Gross Motor Movements are Fluid and Coordinated: No Fine Motor Movements are Fluid and Coordinated: No Coordination and Movement Description: L hemiparesis Motor  Motor Motor: Hemiplegia Mobility  Bed Mobility Bed Mobility: Rolling Right;Rolling Left;Left Sidelying to Sit Rolling Right: Moderate Assistance - Patient 50-74% Rolling Left: Moderate Assistance - Patient 50-74% Left Sidelying to Sit: Maximal Assistance - Patient 25-49%  Trunk/Postural Assessment  Cervical Assessment Cervical Assessment: Exceptions to [facility] Thoracic Assessment Thoracic Assessment: Exceptions to WFL(kyphotic) Lumbar Assessment Lumbar Assessment: Exceptions to WFL(posterior pelvic tilt) Postural Control Postural Control: Deficits on evaluation Trunk Control: L lean in sitting and standing with minimal activity in L trunk musculature Righting Reactions: severely limited Protective Responses: severely limited  Balance Balance Balance Assessed: Yes Dynamic Sitting Balance Sitting balance - Comments: min - max A with  functional tasks Dynamic Standing Balance Dynamic Standing - Level of Assistance: 2: Max assist;1: +1 Total assist Extremity/Trunk Assessment RUE Assessment RUE Assessment: Within Functional Limits LUE Assessment LUE Assessment: Exceptions to [facility] Passive Range of Motion (PROM) Comments: WFL LUE Body System: Neuro Brunstrum levels for arm and hand: Arm;Hand Brunstrum level for arm: Stage V Relative Independence from Synergy Brunstrum level for hand: Stage V Independence from basic synergies LUE Tone LUE Tone: Within Functional Limits   See Function Navigator for Current Functional Status.   Refer to Care Plan for Long Term Goals  Recommendations for other services: None    Discharge Criteria: Patient will be discharged from OT if patient refuses treatment 3 consecutive times without medical reason, if treatment goals not met, if there is a change in medical status, if patient makes no progress towards goals or if patient is discharged from hospital.  The above assessment, treatment plan, treatment alternatives and goals were discussed and mutually agreed upon: by patient  Gypsy Decant 12/05/2017, 8:12 AM

## 2017-12-06 ENCOUNTER — Inpatient Hospital Stay (HOSPITAL_COMMUNITY): Payer: Medicare Other | Admitting: Physical Therapy

## 2017-12-06 ENCOUNTER — Inpatient Hospital Stay (HOSPITAL_COMMUNITY): Payer: Medicare Other | Admitting: Occupational Therapy

## 2017-12-06 ENCOUNTER — Inpatient Hospital Stay (HOSPITAL_COMMUNITY): Payer: Medicare Other | Admitting: Speech Pathology

## 2017-12-06 DIAGNOSIS — K5901 Slow transit constipation: Secondary | ICD-10-CM

## 2017-12-06 DIAGNOSIS — R0989 Other specified symptoms and signs involving the circulatory and respiratory systems: Secondary | ICD-10-CM

## 2017-12-06 DIAGNOSIS — K5900 Constipation, unspecified: Secondary | ICD-10-CM

## 2017-12-06 MED ORDER — ENSURE ENLIVE PO LIQD
237.0000 mL | Freq: Three times a day (TID) | ORAL | Status: DC
  Administered 2017-12-06 – 2017-12-25 (×47): 237 mL via ORAL

## 2017-12-06 NOTE — Progress Notes (Signed)
Physical Therapy Session Note  Patient Details  Name: Dakota [name] MRN: [id number] Date of Birth: Feb 24, 1929  Today's Date: 12/06/2017 PT Individual Time: 1005-1101 PT Individual Time Calculation (min): 56 min   Short Term Goals: Week 1:  PT Short Term Goal 1 (Week 1): Patient to be able to roll and perform supine to sit/sit to supine transfers with ModA  PT Short Term Goal 2 (Week 1): Patient to be able to perform stand pivot transfer with ModA  PT Short Term Goal 3 (Week 1): Patient to be able to ambulate at least 97f with mod assist of 1 and LRAD  PT Short Term Goal 4 (Week 1): Patient to be able to propel wheelchair at least 730fwith MinA without rest break   Skilled Therapeutic Interventions/Progress Updates:   Pt received sitting in WC and agreeable to PT. [facility]stand in parallel bars x 5 with mod progressing to min assist. Pt reports mild dizziness in standing. Orthostatic vitals assessed. 104/57 in sitting. 89/53 in standing with increased sx. PT applied Teds and reassessed standing BP: 112/57.  Gait in parallel bars with mod assist x 5. Gait with RW x 38f25fnd and 22f73fth mod assist from PT for posture, weight shifting. PT instructed pt in car transfers training with mod assist from PT and max cues for sequencing and safety.Patient returned to room and [facility] in reach and all needs met.           Therapy Documentation Precautions:  Precautions Precautions: Fall Precaution Comments: right gaze, left hemiparesis, left lean /pushing Restrictions Weight Bearing Restrictions: No   Pain: Pain Assessment Pain Scale: 0-10 Pain Score: 0-No pain  See Function Navigator for Current Functional Status.   Therapy/Group: Individual Therapy  AustLorie Phenix/2019, 11:07 AM

## 2017-12-06 NOTE — Patient Care Conference (Signed)
Inpatient RehabilitationTeam Conference and Plan of Care Update Date: 12/06/2017   Time: 2:40 PM    Patient Name: Dakota [name]      Medical Record Number: [id number]  Date of Birth: [birth year] Sex: Male         Room/Bed: 4W14C/4W14C-01 Payor Info: Payor: MEDICARE / Plan: MEDICARE PART A AND B / Product Type: *No Product type* /    Admitting Diagnosis: R MCA  Admit Date/Time:  12/04/2017  3:53 PM Admission Comments: No comment available   Primary Diagnosis:  <principal problem not specified> Principal Problem: <principal problem not specified>  Patient Active Problem List   Diagnosis Date Noted  . Labile blood pressure   . Slow transit constipation   . History of GI bleed   . Acute blood loss anemia   . Delirium   . GIB (gastrointestinal bleeding) 11/24/2017  . Hypotension 11/24/2017  . Hypovolemic shock ([place]) 11/24/2017  . Acute lower UTI   . Leucocytosis   . Hypoalbuminemia due to protein-calorie malnutrition ([place])   . Prediabetes   . Prerenal azotemia   . Subacute delirium 11/21/2017  . Dysphagia due to recent cerebrovascular accident 11/21/2017  . Chewing tobacco nicotine dependence 11/21/2017  . Leukocytosis 11/21/2017  . Acute ischemic right MCA stroke ([name]) 11/21/2017  . Cerebrovascular accident (CVA) due to thrombosis of right middle cerebral artery ([place])   . Acute deep vein thrombosis (DVT) of proximal vein of lower extremity ([name])   . AKI (acute kidney injury) ([place])   . Middle cerebral artery embolism, right s/p endovascular therapy 11/16/2017  . Caregiver stress 10/05/2015  . Hematuria 04/06/2015  . ARF (acute renal failure) ([place]) 07/18/2014  . Cardiogenic shock ([place]) 07/18/2014  . Atrial fibrillation with slow ventricular response ([place])   . NSTEMI (non-ST elevated myocardial infarction) ([place])   . CHB (complete heart block) - s/p MDT 1 lead PPM 07/17/2014  . Advance care planning 07/03/2014  . Sebaceous cyst 04/11/2013  . Medicare annual wellness visit,  subsequent 10/28/2012  . RIB PAIN, RIGHT SIDED 01/02/2009  . CAD, NATIVE VESSEL 11/25/2008  . Unspecified vitamin D deficiency 11/24/2008  . RECENT RET DETACH PARTIAL W/SINGLE DEFECT 11/24/2008  . Essential hypertension, benign 07/21/2008  . HLD (hyperlipidemia) 05/22/2007  . Atrial fibrillation ([place]) 05/22/2007  . GERD 05/22/2007  . GASTRIC ULCER, ACUTE, HEMORRHAGE 05/22/2007  . Hyperglycemia 05/22/2007    Expected Discharge Date: Expected Discharge Date: 12/26/17  Team Members Present: Physician leading conference: Dr. Delice Lesch Social Worker Present: Lennart Pall, LCSW Nurse Present: Leonette Nutting, RN PT Present: Barrie Folk, PT;Rosita Dechalus, PTA SLP Present: Windell Moulding, SLP PPS Coordinator present : Daiva Nakayama, RN, CRRN     Current Status/Progress Goal Weekly Team Focus  Medical   Right lean, left-sided weakness,dysphagia, cognitive deficits and difficulty standing secondary to right MCA infarct with GI bleed.  Improve mobility, safety, cognition, contipation, BP  See above   Bowel/Bladder   continent with episodes of incontinence of both, LBM 8/7  fewer episodes of incontinence  timed toileting   Swallow/Nutrition/ Hydration   dys 1, nectar thick liquids; max assist   min assist   trials of thin liquids via teaspoon    ADL's   max A - total A functional transfers, max A LB self care and UB self care, lethargic during eval  min A overall   L NMR, functional transfers, self care retraining, cognition, balance, pt/family education   Mobility   max to total +2 for transfers and initiating gait  min assist overall  NMR, transfers, gait as able, balance, alertness/cognition   Communication   communication is impacted by cognition, currently max assist   mod assist   self monitoring and correcting intelligibility    Safety/Cognition/ Behavioral Observations  max to total assist   min assist   basic cognition    Pain   no c/o pain, has tylenol prn  pain scale <2   assess q shift   Skin   scattered bruising, no skin break down noted  no new acquired areas  assess q shift    Rehab Goals Patient on target to meet rehab goals: Yes *See Care Plan and progress notes for long and short-term goals.     Barriers to Discharge  Current Status/Progress Possible Resolutions Date Resolved   Physician    Medical stability;Other (comments);Incontinence  Cognition  See above  Therapies, follow labs, optimize bowel/BP meds      Nursing  Incontinence               PT  Decreased caregiver support  level of assist that may be needed as family is already taking care of his wife, who is totalA               OT                  SLP Nutrition means              SW                Discharge Planning/Teaching Needs:  Pt's sons have stated they will care for pt at home at d/c.    Family education to be offered closer to d/c.   Team Discussion:  Significant impaired cognition;  Stable labs;  No pain c/o.  Incont/cont dependent on level of alertness.  Min -max assist with bed mob/ b/d and max LB.  Mod assist overall with amb.  Significant perceptual deficits. Most goals set for min assist.  Revisions to Treatment Plan:  NA    Continued Need for Acute Rehabilitation Level of Care: The patient requires daily medical management by a physician with specialized training in physical medicine and rehabilitation for the following conditions: Daily direction of a multidisciplinary physical rehabilitation program to ensure safe treatment while eliciting the highest outcome that is of practical value to the patient.: Yes Daily medical management of patient stability for increased activity during participation in an intensive rehabilitation regime.: Yes Daily analysis of laboratory values and/or radiology reports with any subsequent need for medication adjustment of medical intervention for : Neurological problems;Mood/behavior problems;Blood pressure problems  Mackinzie Vuncannon,  Ellary Casamento 12/06/2017, 4:20 PM

## 2017-12-06 NOTE — Progress Notes (Signed)
Speech Language Pathology Daily Session Note  Patient Details  Name: Dakota [name] MRN: [id number] Date of Birth: Sep 15, 1928  Today's Date: 12/06/2017 SLP Individual Time: 1300-1353 SLP Individual Time Calculation (min): 53 min  Short Term Goals: Week 1: SLP Short Term Goal 1 (Week 1): Pt will consume dys 1 textures and nectar thick liquids via teaspoon with max assist multimodal cues and minimal overt s/s of aspiration.  SLP Short Term Goal 2 (Week 1): Pt will utilize external aids to reorient to place, date, and situation with min assist multimodal cues.  SLP Short Term Goal 3 (Week 1): Pt will sustain his attention to basic, familiar tasks for ~5 minute intervals with max assist multimodal cues.  SLP Short Term Goal 4 (Week 1): Pt will slow rate and increase vocal intensity to achieve intelligibility at the word level with mod assist.   SLP Short Term Goal 5 (Week 1): Pt will locate items to the left of midline during basic familiar tasks in ~50% of opportunities with max assist multimodal cues.  SLP Short Term Goal 6 (Week 1): Pt will consume trials of thin via TSP with min overt s/s aspiration in order to indicate readiness for instrumental swallow assessment.  Skilled Therapeutic Interventions:  Pt was seen for skilled ST targeting goals for dysphagia and cognition.   Pt was received upright in wheelchair awake, alert, and agreeable to participate in [place].  Son at bedside.  SLP facilitated the session with a basic card game to address sustained attention to task and visual scanning to the left of midline.  Overall pt needed mod-max assist to complete task and demonstrated some carryover of task procedures.  SLP also facilitated the session with trials of thin liquids via teaspoon following oral care to continue working towards diet progression.  Pt demonstrated a soft delayed cough in 1 out of 10 opportunities which SLP suspects to be due to attempting to vocalize prior to completing second  volitional swallow.  No overt s/s of aspiration and pt was able to comply with swallowing precautions with max faded to mod assist verbal cues.  Pt was returned to room and transferred back to bed via Stedy lift as pt's son reports that pt had been sitting up in his wheelchair since ~930 this morning and pt was becoming fatigued at the end of today's session.  Continue per current plan of care.   Function:  Eating Eating                 Cognition Comprehension Comprehension assist level: Understands basic 75 - 89% of the time/ requires cueing 10 - 24% of the time  Expression   Expression assist level: Expresses basic 50 - 74% of the time/requires cueing 25 - 49% of the time. Needs to repeat parts of sentences.  Social Interaction Social Interaction assist level: Interacts appropriately 25 - 49% of time - Needs frequent redirection.  Problem Solving Problem solving assist level: Solves basic 25 - 49% of the time - needs direction more than half the time to initiate, plan or complete simple activities  Memory Memory assist level: Recognizes or recalls 25 - 49% of the time/requires cueing 50 - 75% of the time    Pain Pain Assessment Pain Scale: 0-10 Pain Score: 0-No pain  Therapy/Group: Individual Therapy  Saba Neuman, Selinda Orion 12/06/2017, 2:05 PM

## 2017-12-06 NOTE — Progress Notes (Signed)
Social Work Patient ID: Dakota [name], male   DOB: 12/20/1928, 84 y.o.   MRN: [id number]  Have reviewed team conference with pt and son this afternoon.  Pt very sleepy and does not attend.  Son aware and agreeable with targeted d/c date of 8/26 and min assist goals overall.  No questions at this time.  Will continue to follow.  Zofia Peckinpaugh, LCSW

## 2017-12-06 NOTE — Progress Notes (Signed)
Occupational Therapy Session Note  Patient Details  Name: Dakota [name] MRN: [id number] Date of Birth: 13-Feb-1929  Today's Date: 12/06/2017 OT Individual Time: 0902-1002 OT Individual Time Calculation (min): 60 min    Short Term Goals: Week 1:  OT Short Term Goal 1 (Week 1): Pt will be able to sit to EOB with mod A of 1 in prep for self care. OT Short Term Goal 2 (Week 1): Pt will be able to maintain static sitting EOB with min A in prep for self care. OT Short Term Goal 3 (Week 1): Pt will be able to use LUE to wash abdomen and chest with min cues. OT Short Term Goal 5 (Week 1): Pt will identify items in his left visual field with min cues.   Skilled Therapeutic Interventions/Progress Updates:    Pt completed bathing and dressing supine to sit EOB during session.  Decreased arousal to start session but improved with verbal and tactile stimulation from therapist.  Pt's son present for session as well.  He was able to roll to the left with supervision and to the right with mod assist for washing buttocks and donning new brief.  He was able to wash his front peri area in supine with mod instructional cueing, using primarily the [place].  He transitioned to sitting with max assist to complete the rest of bathing and for dressing.  Min guard to min assist for static sitting balance.  Mod assist for dynamic sitting balance while engaged in bathing tasks.  Pt needed max instructional cueing to sequence through bathing, with cueing to maintain visual fixation on the left hand when placing or using it to wash, as he would frequently drop the washcloth.  Increased lean/pushing to the left noted with pt demonstrating awareness of this when asked, but being unable to correct this and maintain without max demonstrational cueing.  Max assist for orientation of clothing and dressing overall, with pt cued to try and place the LLE or LUE in first.  He faired better when attempting this with the pants than he did with  the shirt, secondary to perceptual issues.  Mod assist for squat pivot transfer to the wheelchair on the left side.  Pt needed mod assist for brushing hair from wheelchair level.  Finished session with pt in wheelchair at bedside with son present, awaiting PT session.    Therapy Documentation Precautions:  Precautions Precautions: Fall Precaution Comments: right gaze, left hemiparesis, left lean /pushing Restrictions Weight Bearing Restrictions: No  Pain: Pain Assessment Pain Scale: Faces Pain Score: 0-No pain Faces Pain Scale: No hurt ADL: See Function Navigator for Current Functional Status.   Therapy/Group: Individual Therapy  Miyanna Wiersma OTR/L 12/06/2017, 12:09 PM

## 2017-12-06 NOTE — Progress Notes (Signed)
Nutrition Follow-up  DOCUMENTATION CODES:   Not applicable  INTERVENTION:  48 hour calorie count initiated  Provide Ensure Enlive po TID (thickened to appropriate consistency), each supplement provides 350 kcal and 20 grams of protein.  Continue Ensure Enlive po BID, each supplement provides 350 kcal and 20 grams of protein  Encourage adequate PO intake.   NUTRITION DIAGNOSIS:   Inadequate oral intake related to dysphagia as evidenced by per patient/family report(varied meal completion).  GOAL:   Patient will meet greater than or equal to 90% of their needs  MONITOR:   PO intake, Supplement acceptance, Diet advancement, Weight trends, Labs, Skin, I & O's  REASON FOR ASSESSMENT:   Consult Calorie Count  ASSESSMENT:   84 year old male with history of hypertension, complete heart block status post permanent pacemaker, GERD/Schatzki's ring, AF no Coumadin who was admitted on zero 7/18 10/2017 with left-sided weakness, right gaze preference, slurred speech and mental status changes due to right MCA territory infarct. On 11/24/2017 he developed maroon stools due to GI bleed and was transfused to acute services for closer monitoring. Pt now readmitted to CIR.   Meal completion has been varied from 20-75%. Son at bedside reports appetite and intake associated with degree of pt alertness. Son reports over the past couple of days she has been eating better at meals. Pt continues on a dysphagia 1 diet with nectar thick liquids. RD to order nutritional supplements to aid in caloric and protein needs. Noted pt with a 8.5% weight loss in 1 month. RD to closely monitor. Calorie count has been ordered by PA.   Day 1 calorie count results: Breakfast: 140 kcal and 5 grams of protein Lunch: 242 kcal and 9 grams of protein Dinner: 343 kcal and 14 grams of protein Supplements: 200 kcal and 30 grams of protein  Total nutrition:   925 kcal (54% of kcal needs) 58 grams of protein (77% of protein  needs)  RD to follow up tomorrow for day 2 of final calorie count.  NUTRITION - FOCUSED PHYSICAL EXAM:    Most Recent Value  Orbital Region  Unable to assess  Upper Arm Region  Moderate depletion  Thoracic and Lumbar Region  No depletion  Buccal Region  Unable to assess  Temple Region  Moderate depletion  Clavicle Bone Region  Moderate depletion  Clavicle and Acromion Bone Region  No depletion  Scapular Bone Region  Unable to assess  Dorsal Hand  Unable to assess  Patellar Region  Moderate depletion  Anterior Thigh Region  Moderate depletion  Posterior Calf Region  Unable to assess  Edema (RD Assessment)  None  Hair  Reviewed  Eyes  Reviewed  Mouth  Reviewed  Skin  Reviewed  Nails  Reviewed       Diet Order:   Diet Order           DIET - DYS 1 Room service appropriate? Yes; Fluid consistency: Nectar Thick  Diet effective now          EDUCATION NEEDS:   Not appropriate for education at this time  Skin:  Skin Assessment: Reviewed RN Assessment  Last BM:  8/7  Height:   Ht Readings from Last 1 Encounters:  12/05/17 5\' 7"  (1.702 m)    Weight:   Wt Readings from Last 1 Encounters:  12/06/17 172 lb 6.4 oz (78.2 kg)    Ideal Body Weight:  67.27 kg  BMI:  Body mass index is 27 kg/m.  Estimated Nutritional Needs:  Kcal:  1700-1900  Protein:  75-85 grams  Fluid:  >/= 1.7 L/day    Corrin Parker, MS, RD, LDN Pager # [phone] After hours/ weekend pager # [phone]

## 2017-12-06 NOTE — Progress Notes (Signed)
Occupational Therapy Session Note  Patient Details  Name: Dakota [name] MRN: [id number] Date of Birth: 1929-04-30  Today's Date: 12/06/2017 OT Individual Time: 1500-1530 OT Individual Time Calculation (min): 30 min    Short Term Goals: Week 1:  OT Short Term Goal 1 (Week 1): Pt will be able to sit to EOB with mod A of 1 in prep for self care. OT Short Term Goal 2 (Week 1): Pt will be able to maintain static sitting EOB with min A in prep for self care. OT Short Term Goal 3 (Week 1): Pt will be able to use LUE to wash abdomen and chest with min cues. OT Short Term Goal 5 (Week 1): Pt will identify items in his left visual field with min cues.   Skilled Therapeutic Interventions/Progress Updates:    Pt completed supine to sit EOB with max assist to start session.  He was able to sit statically with min guard assist.  Utilized the [place] to transfer from EOB to standing with mod assist.  Took pt over to the mirror to work on grooming task of brushing his hair and for standing balance.  He demonstrated increased pushing to the left and forward trunk and cervical flexion in standing.  Max facilitation to locate mirror in front of him and keep head up close to neutral extension.  He needed mod assist to sustain standing balance however.  LUE placed in weightbearing on the [facility] for all of standing but needed mod assist to reposition.  He maintained standing for 6-7 mins first attempt before having him sit down on the elevated seat.  Let him sit down in the wheelchair as well for resting for 2-3 mins as well before standing again with mod assist and transferring over to the bed to complete session.  Pt left in bed with alarm in place, call button in reach, and son present.    Therapy Documentation Precautions:  Precautions Precautions: Fall Precaution Comments: right gaze, left hemiparesis, left lean /pushing Restrictions Weight Bearing Restrictions: No  Pain: Pain Assessment Pain Scale:  0-10 Pain Score: 0-No pain ADL: See Function Navigator for Current Functional Status.   Therapy/Group: Individual Therapy  Daryus Sowash OTR/L 12/06/2017, 4:23 PM

## 2017-12-06 NOTE — Progress Notes (Signed)
[place] PHYSICAL MEDICINE & REHABILITATION     PROGRESS NOTE  Subjective/Complaints:  Pt seen lying in bed this morning. Patient well overnight for sleep chart. He remains sleepy this morning.  ROS: Unreliable due to cognition.  Objective: Vital Signs: Blood pressure (!) 153/61, pulse 90, temperature 99 F (37.2 C), temperature source Oral, resp. rate 18, height 5\' 7"  (1.702 m), weight 78.2 kg (172 lb 6.4 oz), SpO2 94 %. No results found. Recent Labs    12/05/17 0625  WBC 9.7  HGB 10.2*  HCT 32.1*  PLT 332   Recent Labs    12/05/17 0625  NA 139  K 3.9  CL 102  GLUCOSE 115*  BUN 10  CREATININE 1.01  CALCIUM 8.8*   CBG (last 3)  Recent Labs    12/04/17 0745  GLUCAP 100*    Wt Readings from Last 3 Encounters:  12/06/17 78.2 kg (172 lb 6.4 oz)  12/04/17 78.1 kg (172 lb 2.9 oz)  11/21/17 81.7 kg (180 lb 1.9 oz)    Physical Exam:  BP (!) 153/61 (BP Location: Left Arm) Comment: RN Notified  Pulse 90   Temp 99 F (37.2 C) (Oral)   Resp 18   Ht 5\' 7"  (1.702 m)   Wt 78.2 kg (172 lb 6.4 oz)   SpO2 94%   BMI 27.00 kg/m  Constitutional: He appears well-developed. He appears cachectic. NAD. HENT: Normocephalic and atraumatic.  Eyes: EOM are normal. No discharge.  Cardiovascular: irregularly irregular. No JVD. Respiratory: Effort normal and breath sounds normal.  GI: Soft. Bowel sounds are normal.  Musculoskeletal: No edema or tenderness in extremities  Neurological: He is alert  dysarthria Oriented 2 Motor: Right upper extremity: 5/5 proximal distal Left upper extremity: 4+/5 proximal to distal Bilateral lower extremities: Hip flexion 3+/5, knee extension 3/5, ankle dorsiflexion 4+/5 (?participation) Left inattention improving Skin: Skin is warm and dry.  Psychiatric: His speech is slurred. Cognition and memory are impaired.   Assessment/Plan: 1. Functional deficits secondary to right infarct with GI bleed which require 3+ hours per day of  interdisciplinary therapy in a comprehensive inpatient rehab setting. Physiatrist is providing close team supervision and 24 hour management of active medical problems listed below. Physiatrist and rehab team continue to assess barriers to discharge/monitor patient progress toward functional and medical goals.  Function:  Bathing Bathing position   Position: Sitting EOB  Bathing parts Body parts bathed by patient: Chest, Abdomen, Right arm, Front perineal area Body parts bathed by helper: Left arm, Buttocks, Right upper leg, Left upper leg, Left lower leg, Right lower leg, Back  Bathing assist Assist Level: (max A)      Upper Body Dressing/Undressing Upper body dressing   What is the patient wearing?: Pull over shirt/dress     Pull over shirt/dress - Perfomed by patient: Put head through opening Pull over shirt/dress - Perfomed by helper: Thread/unthread right sleeve, Thread/unthread left sleeve, Pull shirt over trunk        Upper body assist Assist Level: (max A)      Lower Body Dressing/Undressing Lower body dressing   What is the patient wearing?: Non-skid slipper socks, Pants       Pants- Performed by helper: Thread/unthread right pants leg, Thread/unthread left pants leg, Pull pants up/down   Non-skid slipper socks- Performed by helper: Don/doff right sock, Don/doff left sock                  Lower body assist Assist for lower body  dressing: (total A)      Toileting Toileting Toileting activity did not occur: No continent bowel/bladder event        Toileting assist Assist level: Two helpers   Transfers Chair/bed transfer   Chair/bed transfer method: Other Chair/bed transfer assist level: Total assist (Pt < 25%) Chair/bed transfer assistive device: Mechanical lift Mechanical lift: Ecologist     Max distance: 26ft  Assist level: 2 helpers   Wheelchair   Type: Manual Max wheelchair distance: 30' Assist Level: Moderate  assistance (Pt 50 - 74%)  Cognition Comprehension Comprehension assist level: Understands basic 50 - 74% of the time/ requires cueing 25 - 49% of the time  Expression Expression assist level: Expresses basic 25 - 49% of the time/requires cueing 50 - 75% of the time. Uses single words/gestures.  Social Interaction Social Interaction assist level: Interacts appropriately 25 - 49% of time - Needs frequent redirection.  Problem Solving Problem solving assist level: Solves basic 25 - 49% of the time - needs direction more than half the time to initiate, plan or complete simple activities  Memory Memory assist level: Recognizes or recalls less than 25% of the time/requires cueing greater than 75% of the time    Medical Problem List and Plan: 1.  Right lean, left-sided weakness,dysphagia, cognitive deficits and difficulty standing secondary to right MCA infarct with GI bleed.  Continue CIR 2.  DVT Prophylaxis/Anticoagulation: Mechanical:  Antiembolism stockings, knee (TED hose) Bilateral lower extremities 3. Pain Management: tylenol prn 4. Mood: LCSW to follow for evaluation and support as needed.  5. Neuropsych: This patient is not capable of making decisions on his own behalf. 6. Skin/Wound Care: routine pressure relief measures.  7. Fluids/Electrolytes/Nutrition: Monitor I/O.   BMP within acceptable range on 8/6 8. ABLA due to duodenal ulcer/GIB:   Monitor H/H serial checks.   Increased protonix to 40 mg bid.  Hemoglobin 10.2 on 8/6 9.  Chronic A fib: Monitor HR bid. No anticoagulation for now. No medications due to hypotension.  10. Delirium/sundowning: Continue Depakote tid with Seroquel at bedtime.   Will make adjustments as needed 11. Constipation  Increased senna to bid.  Improving   12. Prediabetes  Monitor with increased mobility 13. Hypoalbuminemia  Supplement initiated on 8/6 14. Blood pressure  Labile on 8/7  LOS (Days) 2 A FACE TO FACE EVALUATION WAS PERFORMED  Ankit  [name] 12/06/2017 8:56 AM

## 2017-12-07 ENCOUNTER — Inpatient Hospital Stay (HOSPITAL_COMMUNITY): Payer: Medicare Other | Admitting: Occupational Therapy

## 2017-12-07 ENCOUNTER — Inpatient Hospital Stay (HOSPITAL_COMMUNITY): Payer: Medicare Other | Admitting: Speech Pathology

## 2017-12-07 ENCOUNTER — Inpatient Hospital Stay (HOSPITAL_COMMUNITY): Payer: Medicare Other | Admitting: Physical Therapy

## 2017-12-07 MED ORDER — PANTOPRAZOLE SODIUM 40 MG PO PACK
40.0000 mg | PACK | Freq: Two times a day (BID) | ORAL | Status: DC
  Administered 2017-12-07 – 2017-12-21 (×28): 40 mg
  Filled 2017-12-07 (×31): qty 20

## 2017-12-07 MED ORDER — TRAZODONE HCL 50 MG PO TABS
50.0000 mg | ORAL_TABLET | Freq: Every day | ORAL | Status: DC
  Administered 2017-12-07: 50 mg via ORAL
  Filled 2017-12-07: qty 1

## 2017-12-07 NOTE — Progress Notes (Addendum)
Occupational Therapy Session Note  Patient Details  Name: Dakota [name] MRN: [id number] Date of Birth: February 14, 1929  Today's Date: 12/07/2017 OT Individual Time: 6546-5035 OT Individual Time Calculation (min): 59 min    Short Term Goals: Week 1:  OT Short Term Goal 1 (Week 1): Pt will be able to sit to EOB with mod A of 1 in prep for self care. OT Short Term Goal 2 (Week 1): Pt will be able to maintain static sitting EOB with min A in prep for self care. OT Short Term Goal 3 (Week 1): Pt will be able to use LUE to wash abdomen and chest with min cues. OT Short Term Goal 5 (Week 1): Pt will identify items in his left visual field with min cues.   Skilled Therapeutic Interventions/Progress Updates:    Pt completed bathing and dressing during session from EOB.  Mod assist for transfer from the wheelchair to the bed stand pivot to the right.  Once sitting focused on reaching laterally to the right to retrieve washcloth for bathing, in order to increase weight shift to the left lateral side.  He needed mod assist to reach all the way to the right with the LUE when using both to squeeze out the washcloth.  Max demonstrational cueing for maintaining upright posture while bathing as he continued to push to the left side constantly.  Gave him target to take his right shoulder to to help decrease it, but he could not maintain it.  During standing he exhibited bowel incontinence, so completed transfer to the [facility] with mod assist.  Total +2 for clothing and hygiene sit to stand.  Finished session with total assist overall for LB dressing and max assist for UB dressing.  Pt was able to assist some with parts of dressing but needed assist secondary to perceptual deficits.  Finished session with transfer back to the bed so pt could rest for PM therapies.  Pt's son present in room at end of session.    Therapy Documentation Precautions:  Precautions Precautions: Fall Precaution Comments: right gaze, left  hemiparesis, left lean /pushing Restrictions Weight Bearing Restrictions: No Pain:   ADL: See Function Navigator for Current Functional Status.   Therapy/Group: Individual Therapy  Iniya Matzek OTR/L 12/07/2017, 4:18 PM

## 2017-12-07 NOTE — Progress Notes (Signed)
PHYSICAL MEDICINE & REHABILITATION     PROGRESS NOTE  Subjective/Complaints:  Patient seen sitting up in his bed this morning. He states he slept well overnight, but from sleep chart and nursing. He is still sleepy this morning. Also was sleepy yesterday AM.  ROS: Unreliable due to cognition.  Objective: Vital Signs: Blood pressure 140/87, pulse 90, temperature 98 F (36.7 C), resp. rate 19, height 5\' 7"  (1.702 m), weight 79 kg, SpO2 98 %. No results found. Recent Labs    12/05/17 0625  WBC 9.7  HGB 10.2*  HCT 32.1*  PLT 332   Recent Labs    12/05/17 0625  NA 139  K 3.9  CL 102  GLUCOSE 115*  BUN 10  CREATININE 1.01  CALCIUM 8.8*   CBG (last 3)  No results for input(s): GLUCAP in the last 72 hours.  Wt Readings from Last 3 Encounters:  12/07/17 79 kg  12/04/17 78.1 kg  11/21/17 81.7 kg    Physical Exam:  BP 140/87 (BP Location: Right Arm)   Pulse 90   Temp 98 F (36.7 C)   Resp 19   Ht 5\' 7"  (1.702 m)   Wt 79 kg   SpO2 98%   BMI 27.28 kg/m  Constitutional: He appears well-developed. He appears cachectic. NAD. HENT: Normocephalic and atraumatic.  Eyes: EOM are normal. No discharge.  Cardiovascular: irregularly irregular. No JVD. Respiratory: Effort normal and breath sounds normal.  GI: Soft. Bowel sounds are normal.  Musculoskeletal: No edema or tenderness in extremities  Neurological: He is alert  Dysarthria Oriented 2 Motor: Right upper extremity: 5/5 proximal distal Left upper extremity: 4+/5 proximal to distal Bilateral lower extremities: Hip flexion 3+/5, knee extension 3/5, ankle dorsiflexion 4+/5 (?participation) Left inattention improving Skin: Skin is warm and dry.  Psychiatric: His speech is slurred. Cognition and memory are impaired.   Assessment/Plan: 1. Functional deficits secondary to right infarct with GI bleed which require 3+ hours per day of interdisciplinary therapy in a comprehensive inpatient rehab  setting. Physiatrist is providing close team supervision and 24 hour management of active medical problems listed below. Physiatrist and rehab team continue to assess barriers to discharge/monitor patient progress toward functional and medical goals.  Function:  Bathing Bathing position   Position: Sitting EOB  Bathing parts Body parts bathed by patient: Right arm, Left arm, Chest, Abdomen, Right upper leg, Left upper leg Body parts bathed by helper: Left lower leg, Back, Right lower leg, Front perineal area, Buttocks  Bathing assist Assist Level: (max A)      Upper Body Dressing/Undressing Upper body dressing   What is the patient wearing?: Pull over shirt/dress     Pull over shirt/dress - Perfomed by patient: Put head through opening Pull over shirt/dress - Perfomed by helper: Thread/unthread right sleeve, Thread/unthread left sleeve, Pull shirt over trunk        Upper body assist Assist Level: (max A)      Lower Body Dressing/Undressing Lower body dressing   What is the patient wearing?: Non-skid slipper socks, Pants     Pants- Performed by patient: Thread/unthread right pants leg Pants- Performed by helper: Thread/unthread left pants leg, Pull pants up/down   Non-skid slipper socks- Performed by helper: Don/doff right sock, Don/doff left sock                  Lower body assist Assist for lower body dressing: (total A)      Toileting Toileting Toileting activity did not  occur: No continent bowel/bladder event        Toileting assist Assist level: Two helpers   Transfers Chair/bed transfer   Chair/bed transfer method: Squat pivot Chair/bed transfer assist level: Moderate assist (Pt 50 - 74%/lift or lower) Chair/bed transfer assistive device: Armrests, Walker Mechanical lift: Ecologist     Max distance: 20 Assist level: Moderate assist (Pt 50 - 74%)   Wheelchair   Type: Manual Max wheelchair distance: 30' Assist Level:  Moderate assistance (Pt 50 - 74%)  Cognition Comprehension Comprehension assist level: Understands basic 75 - 89% of the time/ requires cueing 10 - 24% of the time  Expression Expression assist level: Expresses basic 50 - 74% of the time/requires cueing 25 - 49% of the time. Needs to repeat parts of sentences.  Social Interaction Social Interaction assist level: Interacts appropriately 25 - 49% of time - Needs frequent redirection.  Problem Solving Problem solving assist level: Solves basic 25 - 49% of the time - needs direction more than half the time to initiate, plan or complete simple activities  Memory Memory assist level: Recognizes or recalls 25 - 49% of the time/requires cueing 50 - 75% of the time    Medical Problem List and Plan: 1.  Right lean, left-sided weakness,dysphagia, cognitive deficits and difficulty standing secondary to right MCA infarct with GI bleed.  Continue CIR 2.  DVT Prophylaxis/Anticoagulation: Mechanical:  Antiembolism stockings, knee (TED hose) Bilateral lower extremities 3. Pain Management: tylenol prn 4. Mood: LCSW to follow for evaluation and support as needed.  5. Neuropsych: This patient is not capable of making decisions on his own behalf. 6. Skin/Wound Care: routine pressure relief measures.  7. Fluids/Electrolytes/Nutrition: Monitor I/O.   BMP within acceptable range on 8/6  Labs ordered for tomorrow 8. ABLA due to duodenal ulcer/GIB:   Monitor H/H serial checks.   Increased protonix to 40 mg bid.  Hemoglobin 10.2 on 8/6 9.  Chronic A fib: Monitor HR bid. No anticoagulation for now. No medications due to hypotension.  10. Delirium/sundowning: Continue Depakote tid    Will make adjustments as needed  Seroquel DC'd on 8/8, trazodone started 11. Constipation  Increased senna to bid.  Improving   12. Prediabetes  Monitor with increased mobility 13. Hypoalbuminemia  Supplement initiated on 8/6 14. Blood pressure  Labile on 8/8, but overall  controlled  LOS (Days) 3 A FACE TO FACE EVALUATION WAS PERFORMED  Dakota [name] [name] 12/07/2017 9:05 AM

## 2017-12-07 NOTE — Progress Notes (Signed)
Speech Language Pathology Daily Session Note  Patient Details  Name: Dakota [name] MRN: [id number] Date of Birth: 11-06-1928  Today's Date: 12/07/2017 SLP Individual Time: 0800-0857 SLP Individual Time Calculation (min): 57 min  Short Term Goals: Week 1: SLP Short Term Goal 1 (Week 1): Pt will consume dys 1 textures and nectar thick liquids via teaspoon with max assist multimodal cues and minimal overt s/s of aspiration.  SLP Short Term Goal 2 (Week 1): Pt will utilize external aids to reorient to place, date, and situation with min assist multimodal cues.  SLP Short Term Goal 3 (Week 1): Pt will sustain his attention to basic, familiar tasks for ~5 minute intervals with max assist multimodal cues.  SLP Short Term Goal 4 (Week 1): Pt will slow rate and increase vocal intensity to achieve intelligibility at the word level with mod assist.   SLP Short Term Goal 5 (Week 1): Pt will locate items to the left of midline during basic familiar tasks in ~50% of opportunities with max assist multimodal cues.  SLP Short Term Goal 6 (Week 1): Pt will consume trials of thin via TSP with min overt s/s aspiration in order to indicate readiness for instrumental swallow assessment.  Skilled Therapeutic Interventions:  Pt was seen for skilled ST targeting cognitive and dysphagia goals.  Pt was soundly asleep upon therapist's arrival but awakened with turning lights on and therapist's voice.  Pt was transferred to bed via stedy lift.  Due to lethargy upon awaking, pt had increased posterior and lateral lean when transferring and needed max cues to improve awareness of postural control.  Pt was transported to a bright and stimulating environment to maximize alertness to breakfast meal.  Pt needed max cues for task initiation and sequencing initially but as repetition of task took effect therapist was able to fade cues to min-mod assist.  Mod-max cues were needed throughout meal to locate objects to the left of midline  on his breakfast tray.  Pt demonstrated no overt s/s of aspiration with pureed textures or nectar thick liquids via teaspoon, even when pt was allowed to self feed at an increased rate.  Overall the automaticity of pt's oropharyngeal swallow appears markedly improved in comparison to previous admission to CIR.  Therefore, recommend a repeat MBS tomorrow to determine readiness to advance diet.  Pt was returned to room and left in wheelchair with son at bedside.  Discussed recommendations with pt and son and provided updates regarding pt's goals and progress in therapy.  Continue per current plan of care.     Function:  Eating Eating   Modified Consistency Diet: Yes Eating Assist Level: Supervision or verbal cues;Help with picking up utensils;Help managing cup/glass           Cognition Comprehension Comprehension assist level: Understands basic 75 - 89% of the time/ requires cueing 10 - 24% of the time  Expression   Expression assist level: Expresses basic 50 - 74% of the time/requires cueing 25 - 49% of the time. Needs to repeat parts of sentences.  Social Interaction Social Interaction assist level: Interacts appropriately 25 - 49% of time - Needs frequent redirection.  Problem Solving Problem solving assist level: Solves basic 25 - 49% of the time - needs direction more than half the time to initiate, plan or complete simple activities  Memory Memory assist level: Recognizes or recalls 25 - 49% of the time/requires cueing 50 - 75% of the time    Pain Pain Assessment Pain Scale:  0-10 Pain Score: 0-No pain  Therapy/Group: Individual Therapy  Braelee Herrle, Selinda Orion 12/07/2017, 9:11 AM

## 2017-12-07 NOTE — Progress Notes (Signed)
Calorie Count Note  48 hour calorie count ordered.  Diet: Dysphagia 1 diet with nectar thick liquids Supplements:   Ensure Enlive po TID, each supplement provides 350 kcal and 20 grams of protein.  30 ml Prostat po BID, each supplement provides 100 kcal and 15 grams of protein.   Breakfast: 175 kcal, 6 grams of protein Lunch: 325 kcal, 23 grams of protein Dinner: no information provided Nutritional Supplements: 1400 kcal, 93 grams of protein  Day 2 Total intake: 1900 kcal (100% of minimum estimated needs)  122 grams of protein (100% of minimum estimated needs)  Estimated Nutritional Needs:  Kcal:  1700-1900 Protein:  75-85 grams Fluid:  >/= 1.7 L/day  Meal completion has been varied from 25-75%. Most to all nutrition coming from oral nutritional supplements. Pt currently has Ensure, prostat, and vital cuisine shakes ordered and has been consuming them. RD to continue with current orders.   Nutrition Dx: Inadequate oral intake related to dysphagia as evidenced by per patient/family report(varied meal completion); improved  Goal:  Pt to meet >/= 90% of their estimated nutrition needs; met  Intervention:   Discontinue calorie count   Continue Ensure Enlive po TID (thickened with appropriate consistency), each supplement provides 350 kcal and 20 grams of protein.   May use Vital Cuisine shakes as an Ensure substitute.   Continue 30 ml Prostat po BID, each supplement provides 100 kcal and 15 grams of protein.   Corrin Parker, MS, RD, LDN Pager # [phone] After hours/ weekend pager # [phone]

## 2017-12-07 NOTE — Plan of Care (Signed)
  Problem: Consults Goal: RH STROKE PATIENT EDUCATION Description See Patient Education module for education specifics  Outcome: Progressing Goal: Nutrition Consult-if indicated Outcome: Progressing   Problem: RH BOWEL ELIMINATION Goal: RH STG MANAGE BOWEL W/MEDICATION W/ASSISTANCE Description STG Manage Bowel with Medication with  [place].  Outcome: Progressing   Problem: RH BLADDER ELIMINATION Goal: RH STG MANAGE BLADDER WITH ASSISTANCE Description STG Manage Bladder With  Mod Assistance   Outcome: Progressing   Problem: RH SKIN INTEGRITY Goal: RH STG SKIN FREE OF INFECTION/BREAKDOWN Description Patient and family will be able to verbalize understanding of preventing breakdown   Outcome: Progressing Goal: RH STG MAINTAIN SKIN INTEGRITY WITH ASSISTANCE Description STG Maintain Skin Integrity With  [place].  Outcome: Progressing   Problem: RH SAFETY Goal: RH STG ADHERE TO SAFETY PRECAUTIONS W/ASSISTANCE/DEVICE Description STG Adhere to Safety Precautions With  Mod Assistance/Device.   Outcome: Progressing Goal: RH STG DECREASED RISK OF FALL WITH ASSISTANCE Description STG Decreased Risk of Fall With  Mod Assistance.   Outcome: Progressing Goal: RH STG DEMO UNDERSTANDING HOME SAFETY PRECAUTIONS Outcome: Progressing   Problem: RH COGNITION-NURSING Goal: RH STG USES MEMORY AIDS/STRATEGIES W/ASSIST TO PROBLEM SOLVE Description STG Uses Memory Aids/Strategies With mod  Assistance to Problem Solve.   Outcome: Progressing   Problem: RH PAIN MANAGEMENT Goal: RH STG PAIN MANAGED AT OR BELOW PT'S PAIN GOAL Description Less than 3   Outcome: Progressing   Problem: RH KNOWLEDGE DEFICIT Goal: RH STG INCREASE KNOWLEDGE OF DYSPHAGIA/FLUID INTAKE Description Patient and family will verbalize understanding of prevention of aspiration prior to discharge.  Outcome: Progressing   Problem: RH BOWEL ELIMINATION Goal: RH STG MANAGE BOWEL WITH  ASSISTANCE Description STG Manage Bowel with mod  Assistance.   Outcome: Not Progressing   Patient had incontinent bowel movement with therapy this morning.  Brita Romp, RN

## 2017-12-07 NOTE — Progress Notes (Signed)
Physical Therapy Session Note  Patient Details  Name: Dakota [name] MRN: [id number] Date of Birth: 18-Jun-1928  Today's Date: 12/07/2017 PT Individual Time: 1100-1200 1405-1430 PT Individual Time Calculation (min): 60 min and 25 min   Short Term Goals: Week 1:  PT Short Term Goal 1 (Week 1): Patient to be able to roll and perform supine to sit/sit to supine transfers with ModA  PT Short Term Goal 2 (Week 1): Patient to be able to perform stand pivot transfer with ModA  PT Short Term Goal 3 (Week 1): Patient to be able to ambulate at least 65f with mod assist of 1 and LRAD  PT Short Term Goal 4 (Week 1): Patient to be able to propel wheelchair at least 772fwith MinA without rest break   Skilled Therapeutic Interventions/Progress Updates:   Pt received supine in bed and agreeable to PT. Supine>sit transfer with mod assist and moderate cues for sequencing. Sitting balance EOB with mod assist progressing to min assist. Stand pivot [facility] assist to the L and mod assist to the R throughout treatment.    Standing balance to force weight shift to the R  And increased WB through the LUE/LLE with reaching task to the R, 2 x 6.   Min-mod assist from PT for safety and to improve posture and weight shiftin  Sitting balance and core NMR to improve R rotation with BUE ball taps and BUE diagonals with emphasis on R rotation and weight shifting and mod assist from PT for attention to the LUE  Gait training with RW 5038f 2 with mod assist +2 from PT for WC follow. PT required to facilitate trunk extension and abduction of the LLE to reduce pushing tendencies.   Nustep reciprocal movement training x 5 min with moderate assist for improved use of the L UE and activation of L trunk musculature   Patient returned to room and left sitting in WC [facility] reach and all needs met.     Session 2.   Pt received sitting in WC and agreeable to PT. Pt [facility] to day [facility]  NMR at kinetron  4 x 45 sec at 45cm/sec>30cm/sec. Prolonged rest break following each bout. Moderate cues for improved attention to the LLE and increased ROM.   Pt returned to room and performed stand pivot trasnfer to bed with moderate assist. Sit>supine completed with mod assist to control BLE. Pt and left supine in bed with call bell in reach and all needs met.              Therapy Documentation Precautions:  Precautions Precautions: Fall Precaution Comments: right gaze, left hemiparesis, left lean /pushing Restrictions Weight Bearing Restrictions: No    Pain: Pain Assessment Pain Scale: 0-10 Pain Score: 0-No pain   See Function Navigator for Current Functional Status.   Therapy/Group: Individual Therapy  AusLorie Phenix8/2019, 12:19 PM

## 2017-12-08 ENCOUNTER — Encounter (HOSPITAL_COMMUNITY): Payer: Medicare Other | Admitting: Speech Pathology

## 2017-12-08 ENCOUNTER — Inpatient Hospital Stay (HOSPITAL_COMMUNITY): Payer: Medicare Other | Admitting: Occupational Therapy

## 2017-12-08 ENCOUNTER — Inpatient Hospital Stay (HOSPITAL_COMMUNITY): Payer: Medicare Other

## 2017-12-08 ENCOUNTER — Inpatient Hospital Stay (HOSPITAL_COMMUNITY): Payer: Medicare Other | Admitting: Physical Therapy

## 2017-12-08 ENCOUNTER — Encounter: Payer: Medicare Other | Admitting: Physical Medicine & Rehabilitation

## 2017-12-08 DIAGNOSIS — F05 Delirium due to known physiological condition: Secondary | ICD-10-CM

## 2017-12-08 LAB — BASIC METABOLIC PANEL
Anion gap: 11 (ref 5–15)
BUN: 20 mg/dL (ref 8–23)
CALCIUM: 9 mg/dL (ref 8.9–10.3)
CO2: 28 mmol/L (ref 22–32)
CREATININE: 1.03 mg/dL (ref 0.61–1.24)
Chloride: 101 mmol/L (ref 98–111)
Glucose, Bld: 111 mg/dL — ABNORMAL HIGH (ref 70–99)
Potassium: 4.1 mmol/L (ref 3.5–5.1)
Sodium: 140 mmol/L (ref 135–145)

## 2017-12-08 LAB — CBC
HCT: 32.3 % — ABNORMAL LOW (ref 39.0–52.0)
Hemoglobin: 10.4 g/dL — ABNORMAL LOW (ref 13.0–17.0)
MCH: 29.8 pg (ref 26.0–34.0)
MCHC: 32.2 g/dL (ref 30.0–36.0)
MCV: 92.6 fL (ref 78.0–100.0)
PLATELETS: 311 10*3/uL (ref 150–400)
RBC: 3.49 MIL/uL — ABNORMAL LOW (ref 4.22–5.81)
RDW: 14.3 % (ref 11.5–15.5)
WBC: 8.1 10*3/uL (ref 4.0–10.5)

## 2017-12-08 MED ORDER — TEMAZEPAM 7.5 MG PO CAPS
7.5000 mg | ORAL_CAPSULE | Freq: Every day | ORAL | Status: DC
  Administered 2017-12-08 – 2017-12-12 (×5): 7.5 mg via ORAL
  Filled 2017-12-08 (×5): qty 1

## 2017-12-08 NOTE — Progress Notes (Signed)
Speech Language Pathology Daily Session Note  Patient Details  Name: Dakota [name] MRN: [id number] Date of Birth: 06-Dec-1928  Today's Date: 12/08/2017 SLP Individual Time: 0630-1601 SLP Individual Time Calculation (min): 10 min  Short Term Goals: Week 1: SLP Short Term Goal 1 (Week 1): Pt will consume dys 1 textures and nectar thick liquids via teaspoon with max assist multimodal cues and minimal overt s/s of aspiration.  SLP Short Term Goal 2 (Week 1): Pt will utilize external aids to reorient to place, date, and situation with min assist multimodal cues.  SLP Short Term Goal 3 (Week 1): Pt will sustain his attention to basic, familiar tasks for ~5 minute intervals with max assist multimodal cues.  SLP Short Term Goal 4 (Week 1): Pt will slow rate and increase vocal intensity to achieve intelligibility at the word level with mod assist.   SLP Short Term Goal 5 (Week 1): Pt will locate items to the left of midline during basic familiar tasks in ~50% of opportunities with max assist multimodal cues.  SLP Short Term Goal 6 (Week 1): Pt will consume trials of thin via TSP with min overt s/s aspiration in order to indicate readiness for instrumental swallow assessment.  Skilled Therapeutic Interventions: Skilled treatment session focused on education with pt's son on swallow ability, diet recommend for nectar thick liquids via cup (instead of tsp), continue puree with trials of thin liquids with SLP only. Education provided to nursing staff as well and information posted/updated in pt's room. Continue per current plan of care.      Function:  Eating Eating   Modified Consistency Diet: (MBS)             Cognition Comprehension Comprehension assist level: Understands basic 50 - 74% of the time/ requires cueing 25 - 49% of the time  Expression   Expression assist level: Expresses basic 50 - 74% of the time/requires cueing 25 - 49% of the time. Needs to repeat parts of sentences.  Social  Interaction Social Interaction assist level: Interacts appropriately 25 - 49% of time - Needs frequent redirection.  Problem Solving Problem solving assist level: Solves basic 25 - 49% of the time - needs direction more than half the time to initiate, plan or complete simple activities  Memory Memory assist level: Recognizes or recalls 25 - 49% of the time/requires cueing 50 - 75% of the time    Pain Pain Assessment Faces Pain Scale: No hurt  Therapy/Group: Individual Therapy and MBSS/FEES  Shahrzad Koble 12/08/2017, 12:16 PM

## 2017-12-08 NOTE — Progress Notes (Signed)
[place] PHYSICAL MEDICINE & REHABILITATION     PROGRESS NOTE  Subjective/Complaints:  Patient seen sitting up in bed this morning. He states he did not sleep well overnight, confirmed with sleep chart.  ROS: Unreliable due to cognition and dysarthria.  Objective: Vital Signs: Blood pressure (!) 102/53, pulse 99, temperature 97.6 F (36.4 C), temperature source Oral, resp. rate 20, height 5\' 7"  (1.702 m), weight 79 kg, SpO2 97 %. No results found. Recent Labs    12/08/17 0551  WBC 8.1  HGB 10.4*  HCT 32.3*  PLT 311   Recent Labs    12/08/17 0551  NA 140  K 4.1  CL 101  GLUCOSE 111*  BUN 20  CREATININE 1.03  CALCIUM 9.0   CBG (last 3)  No results for input(s): GLUCAP in the last 72 hours.  Wt Readings from Last 3 Encounters:  12/07/17 79 kg  12/04/17 78.1 kg  11/21/17 81.7 kg    Physical Exam:  BP (!) 102/53 (BP Location: Right Arm)   Pulse 99   Temp 97.6 F (36.4 C) (Oral)   Resp 20   Ht 5\' 7"  (1.702 m)   Wt 79 kg   SpO2 97%   BMI 27.28 kg/m  Constitutional: He appears well-developed. He appears cachectic. NAD. HENT: Normocephalic and atraumatic.  Eyes: EOM are normal. No discharge.  Cardiovascular: irregularly irregular. No JVD. Respiratory: Effort normal and breath sounds normal.  GI: Soft. Bowel sounds are normal.  Musculoskeletal: No edema or tenderness in extremities  Neurological: He is alert  Dysarthria Oriented 2 Motor: Right upper extremity: 5/5 proximal distal Left upper extremity: 4+/5 proximal to distal Bilateral lower extremities: Hip flexion 3+/5, knee extension 3/5, ankle dorsiflexion 4+/5 (?participation) Left inattention improving Skin: Skin is warm and dry.  Psychiatric: His speech is slurred and slowed. Cognition and memory are impaired.   Assessment/Plan: 1. Functional deficits secondary to right infarct with GI bleed which require 3+ hours per day of interdisciplinary therapy in a comprehensive inpatient rehab  setting. Physiatrist is providing close team supervision and 24 hour management of active medical problems listed below. Physiatrist and rehab team continue to assess barriers to discharge/monitor patient progress toward functional and medical goals.  Function:  Bathing Bathing position   Position: Sitting EOB  Bathing parts Body parts bathed by patient: Right arm, Left arm, Chest, Abdomen, Right upper leg, Left upper leg Body parts bathed by helper: Front perineal area, Buttocks, Back, Left lower leg, Right lower leg  Bathing assist Assist Level: 2 helpers      Upper Body Dressing/Undressing Upper body dressing   What is the patient wearing?: Pull over shirt/dress     Pull over shirt/dress - Perfomed by patient: Thread/unthread left sleeve Pull over shirt/dress - Perfomed by helper: Thread/unthread right sleeve, Put head through opening, Pull shirt over trunk        Upper body assist Assist Level: (max A)      Lower Body Dressing/Undressing Lower body dressing   What is the patient wearing?: Non-skid slipper socks, Pants, Ted Hose     Pants- Performed by patient: Thread/unthread right pants leg Pants- Performed by helper: Thread/unthread right pants leg, Thread/unthread left pants leg, Pull pants up/down   Non-skid slipper socks- Performed by helper: Don/doff right sock, Don/doff left sock               TED Hose - Performed by helper: Don/doff right TED hose, Don/doff left TED hose  Lower body assist Assist for  lower body dressing: 2 Automotive engineer activity did not occur: No continent bowel/bladder event   Toileting steps completed by helper: Adjust clothing prior to toileting, Performs perineal hygiene, Adjust clothing after toileting    Toileting assist Assist level: Two helpers   Transfers Chair/bed transfer   Chair/bed transfer method: Stand pivot Chair/bed transfer assist level: Moderate assist (Pt 50 - 74%/lift or  lower) Chair/bed transfer assistive device: Armrests, Walker Mechanical lift: Ecologist     Max distance: 20 Assist level: Moderate assist (Pt 50 - 74%)   Wheelchair   Type: Manual Max wheelchair distance: 30' Assist Level: Moderate assistance (Pt 50 - 74%)  Cognition Comprehension Comprehension assist level: Understands basic 50 - 74% of the time/ requires cueing 25 - 49% of the time  Expression Expression assist level: Expresses basic 50 - 74% of the time/requires cueing 25 - 49% of the time. Needs to repeat parts of sentences.  Social Interaction Social Interaction assist level: Interacts appropriately 25 - 49% of time - Needs frequent redirection.  Problem Solving Problem solving assist level: Solves basic 25 - 49% of the time - needs direction more than half the time to initiate, plan or complete simple activities  Memory Memory assist level: Recognizes or recalls 25 - 49% of the time/requires cueing 50 - 75% of the time    Medical Problem List and Plan: 1.  Right lean, left-sided weakness,dysphagia, cognitive deficits and difficulty standing secondary to right MCA infarct with GI bleed.  Continue CIR 2.  DVT Prophylaxis/Anticoagulation: Mechanical:  Antiembolism stockings, knee (TED hose) Bilateral lower extremities 3. Pain Management: tylenol prn 4. Mood: LCSW to follow for evaluation and support as needed.  5. Neuropsych: This patient is not capable of making decisions on his own behalf. 6. Skin/Wound Care: routine pressure relief measures.  7. Fluids/Electrolytes/Nutrition: Monitor I/O.   BMP within acceptable range on 8/9 8. ABLA due to duodenal ulcer/GIB:   Monitor H/H serial checks.   Increased protonix to 40 mg bid.  Hemoglobin 10.4 on 8/9 9.  Chronic A fib: Monitor HR bid. No anticoagulation for now. No medications due to hypotension.  10. Delirium/sundowning: Continue Depakote tid    Will make adjustments as needed  Seroquel DC'd on 8/8 due to  oversedation, trazodone ineffective, restoril started on 8/9 11. Constipation  Increased senna to bid.  Improving   12. Prediabetes  Slightly elevated on 8/9  Monitor with increased mobility 13. Hypoalbuminemia  Supplement initiated on 8/6 14. Blood pressure  Labile on 8/9  LOS (Days) 4 A FACE TO FACE EVALUATION WAS PERFORMED  Ankit [name] 12/08/2017 8:47 AM

## 2017-12-08 NOTE — Progress Notes (Signed)
Physical Therapy Session Note  Patient Details  Name: Dakota [name] MRN: [id number] Date of Birth: May 05, 1928  Today's Date: 12/08/2017 PT Individual Time: 1000-1100 AND 1550-1600 PT Individual Time Calculation (min): 60 min AND 40 min   Short Term Goals: Week 1:  PT Short Term Goal 1 (Week 1): Patient to be able to roll and perform supine to sit/sit to supine transfers with ModA  PT Short Term Goal 2 (Week 1): Patient to be able to perform stand pivot transfer with ModA  PT Short Term Goal 3 (Week 1): Patient to be able to ambulate at least 51f with mod assist of 1 and LRAD  PT Short Term Goal 4 (Week 1): Patient to be able to propel wheelchair at least 714fwith MinA without rest break   Skilled Therapeutic Interventions/Progress Updates:   Pt received supine in bed and agreeable to PT. Supine >Sit with max assist due to fatigue. Stand pivot transfer to the L with max assist. [facility] with min assist and RW. Gait training with RW 2 x 7095fith mod assist from PT to control RW and improve attention to the LLE. Stand pivot transfer to and from nustep on the R with mod assist. nustep reciprocal movement training and LUE brace 6 min +4 min at level 5 with mod assist/cues for . dynavision x 2 min program A(L side) with mod-max cues for use of LUE and visual scanning  To the L Patient returned to room and [facility] and all needs met.     Session 2.   Pt received sitting in WC and agreeable to PT. dynavision program A L side 6 x 1 min. Max fading to moderate cues for visual scanning to the L and improved attention to the LUE to press target buttons.   Stair training x 4 steps with moderate assist and max cues for step to gait pattern. Pt noted utilize step over step gait with poor safety awareness with advancing the LLE.   Forced use of LLE to kick soccder ball from sitting and standing. X 10 with max cues for use of  LLE only. Gait with RW and min-mod assist from PT x [id number] max multimodal cues for increased step length on the L to simulate kick for step on the L  Pt returned to room and performed stand pivot transfer to bed with moderate assist. Sit>supine completed with moderate assist at BLE and left supine in bed with call bell in reach and all needs met.        Therapy Documentation Precautions:  Precautions Precautions: Fall Precaution Comments: right gaze, left hemiparesis, left lean /pushing Restrictions Weight Bearing Restrictions: No Pain: Faces: no pain.   See Function Navigator for Current Functional Status.   Therapy/Group: Individual Therapy  AustLorie Phenix/2019, 3:47 PM

## 2017-12-08 NOTE — Progress Notes (Signed)
Modified Barium Swallow Progress Note  Patient Details  Name: Dakota [name] MRN: [id number] Date of Birth: 1928/05/13  Today's Date: 12/08/2017  Modified Barium Swallow completed.  Full report located under Chart Review in the Imaging Section.  Brief recommendations include the following:  Clinical Impression  Pt presents with moderate oral phase deficits related to cognitive deficits and oral motor deficits c/b decrease lingual manipulation resulting in decreased bolus cohesion, premature spillage and significant lignual pumping for bolus movement and swallow initiation. Pt also presents with mild sensorimotor pharyngeal phase c/b delayed swallow initiation to pyriform sinuses with mild widespread residue throughout pharynx when consuming nectar thick and thin liquids. Pt with no penetration or aspiration with nectar thick via cups (extensive trials provided) and thin liquids via tsp. However, pt's pyriform sinuses unable to contain cup sips of thin lqiuids resulting in penetration and eventual aspiration of thin liquids via cup. Pt with delayed cough response to aspiration event (sensed aspiration). Would recommend upgrading to nectar thick liquids via cup, continue puree, mekdicine crushed in applesauce and trials of thin liquids via tsp with SLP. Can upgrade to thin liquids at bedside if pt doesn't demonstrate cough.    Swallow Evaluation Recommendations       SLP Diet Recommendations: Dysphagia 1 (Puree) solids;Nectar thick liquid(via cup)   Liquid Administration via: Cup;No straw   Medication Administration: Crushed with puree   Supervision: Full supervision/cueing for compensatory strategies;Staff to assist with self feeding   Compensations: Slow rate;Small sips/bites;Minimize environmental distractions   Postural Changes: Seated upright at 90 degrees   Oral Care Recommendations: Oral care BID   Other Recommendations: Order thickener from [place] 12/08/2017,12:13 PM

## 2017-12-08 NOTE — Progress Notes (Signed)
Occupational Therapy Session Note  Patient Details  Name: Dakota [name] MRN: [id number] Date of Birth: 05-08-1928  Today's Date: 12/08/2017 OT Individual Time: 1430-1530 OT Individual Time Calculation (min): 60 min    Short Term Goals: Week 1:  OT Short Term Goal 1 (Week 1): Pt will be able to sit to EOB with mod A of 1 in prep for self care. OT Short Term Goal 2 (Week 1): Pt will be able to maintain static sitting EOB with min A in prep for self care. OT Short Term Goal 3 (Week 1): Pt will be able to use LUE to wash abdomen and chest with min cues. OT Short Term Goal 5 (Week 1): Pt will identify items in his left visual field with min cues.   Skilled Therapeutic Interventions/Progress Updates:    Pt transferred stand pivot to the wheelchair with mod assist to start session.  Rolled pt down to the therapy gym via wheelchair with transfer to the therapy mat once in the gym.  Focused session on functional reaching in sitting and standing.  He was able to maintain static sitting EOM with supervision.  Still with increased lean to the left in sitting, but more in midline than previous sessions.  He was able to complete functional reach with the RUE forward and then return to midline with min guard assist to retrieve cards and place them on the vertical board.  When using the LUE to reach, he was able to pick up the cards, but would drop them frequently secondary to decreased sensation and feedback in the left hand as well as decreased visual attention.  He was able to stand with mod assist as well and place cards with the right hand and max questioning cueing.  He would frequently attempt to place the card on the one that didn't match secondary to not scanning far enough to the left to locate it, as well as not being able to visually recognize the differences.  Still with increased trunk and cervical flexion in standing with increased knee flexion in the LLE as well.  Finished session with functional  mobility with mod assist hand held before returning to the room via wheelchair.  Issued foam pieces for son to continue working with pt on scanning to the left side and incorporating functional reach with the LUE as well.  Pt left with son in room and call button and phone in reach.    Therapy Documentation Precautions:  Precautions Precautions: Fall Precaution Comments: right gaze, left hemiparesis, left lean /pushing Restrictions Weight Bearing Restrictions: No   Pain: Pain Assessment Pain Scale: Faces Pain Score: 0-No pain ADL: See Function Navigator for Current Functional Status.   Therapy/Group: Individual Therapy  Daltyn Degroat OTR/L 12/08/2017, 3:51 PM

## 2017-12-09 ENCOUNTER — Inpatient Hospital Stay (HOSPITAL_COMMUNITY): Payer: Medicare Other

## 2017-12-09 ENCOUNTER — Inpatient Hospital Stay (HOSPITAL_COMMUNITY): Payer: Medicare Other | Admitting: Speech Pathology

## 2017-12-09 ENCOUNTER — Inpatient Hospital Stay (HOSPITAL_COMMUNITY): Payer: Medicare Other | Admitting: Physical Therapy

## 2017-12-09 NOTE — Progress Notes (Addendum)
[place] PHYSICAL MEDICINE & REHABILITATION     PROGRESS NOTE  Subjective/Complaints:  Patient seen sitting up in chair this morning.  Patient feels well.  Son is in the room with him.  They have no concerns or complaints.  Objective: Vital Signs:  Physical Exam:  BP (!) 146/95 (BP Location: Right Arm)   Pulse 86   Temp 97.8 F (36.6 C) (Oral)   Resp 16   Ht 5\' 7"  (1.702 m)   Wt 79 kg   SpO2 100%   BMI 27.28 kg/m  Elderly, ill-appearing male in no acute distress.  Sitting up in a chair.  HEENT exam atraumatic, normocephalic, neck supple.  No jugular venous distention Chest clear to auscultation Cardiac exam S1-S2 regular Abdominal exam active bowel sounds, soft Extremities without significant edema.  Assessment/Plan: 1. Functional deficits secondary to right infarct with GI bleed    Medical Problem List and Plan: 1.  Right lean, left-sided weakness,dysphagia, cognitive deficits and difficulty standing secondary to right MCA infarct with GI bleed.  Continue CIR 2.  DVT Prophylaxis/Anticoagulation: Mechanical:  Antiembolism stockings, knee (TED hose) Bilateral lower extremities 3. Pain Management: tylenol prn 4. Mood: LCSW to follow for evaluation and support as needed.  5. Neuropsych: This patient is not capable of making decisions on his own behalf. 6. Skin/Wound Care: routine pressure relief measures.  7. Fluids/Electrolytes/Nutrition: Monitor I/O.  Basic Metabolic Panel:    Component Value Date/Time   NA 140 12/08/2017 0551   K 4.1 12/08/2017 0551   CL 101 12/08/2017 0551   CO2 28 12/08/2017 0551   BUN 20 12/08/2017 0551   CREATININE 1.03 12/08/2017 0551   GLUCOSE 111 (H) 12/08/2017 0551   CALCIUM 9.0 12/08/2017 0551    8. ABLA due to duodenal ulcer/GIB:   Monitor H/H serial checks.   Increased protonix to 40 mg bid.  Hemoglobin 10.4 on 8/9 9.  Chronic A fib: Monitor HR bid. No anticoagulation for now. No medications due to hypotension.  10.  Delirium/sundowning: Continue Depakote tid    Will make adjustments as needed  Seroquel DC'd on 8/8 due to oversedation, trazodone ineffective, restoril started on 8/9 11. Constipation  Increased senna to bid.  Improving   12. Prediabetes  No concerns 13. Hypoalbuminemia  Supplement initiated on 8/6 14. Blood pressure 91/77-146/95 Continue to monitor  LOS (Days) 5 A FACE TO FACE EVALUATION WAS PERFORMED  Maeryn Mcgath H Omer Monter 12/09/2017 10:50 AM

## 2017-12-09 NOTE — Progress Notes (Signed)
Speech Language Pathology Daily Session Note  Patient Details  Name: Dakota [name] MRN: [id number] Date of Birth: [birth year]  Today's Date: 12/09/2017 SLP Individual Time: 1400-1425 SLP Individual Time Calculation (min): 25 min  Short Term Goals: Week 1: SLP Short Term Goal 1 (Week 1): Pt will consume dys 1 textures and nectar thick liquids via teaspoon with max assist multimodal cues and minimal overt s/s of aspiration.  SLP Short Term Goal 2 (Week 1): Pt will utilize external aids to reorient to place, date, and situation with min assist multimodal cues.  SLP Short Term Goal 3 (Week 1): Pt will sustain his attention to basic, familiar tasks for ~5 minute intervals with max assist multimodal cues.  SLP Short Term Goal 4 (Week 1): Pt will slow rate and increase vocal intensity to achieve intelligibility at the word level with mod assist.   SLP Short Term Goal 5 (Week 1): Pt will locate items to the left of midline during basic familiar tasks in ~50% of opportunities with max assist multimodal cues.  SLP Short Term Goal 6 (Week 1): Pt will consume trials of thin via TSP with min overt s/s aspiration in order to indicate readiness for instrumental swallow assessment.  Skilled Therapeutic Interventions: Skilled treatment session focused on dysphagia goals. SLP facilitated session by providing oral care via the suction toothbrush and repositioning patient for safe PO intake. Patient with initial cough on trials of thin liquids via tsp that was administered by clinician, suspect due to decreased attention to bolus and amount of water on tsp. However, no further overt s/s of aspiration noted when tsp sips were self-fed by the patient (smaller amount of liquid with increased attention). Therefore, recommend patient initiate the water protocol with liquids administered via tsp with patient self-feeding. Patient's family member present and verbalized understanding of procedures. Patient left upright in bed  with alarm on and all needs within reach. Continue with current plan of care.      Function:  Eating Eating   Modified Consistency Diet: Yes Eating Assist Level: Supervision or verbal cues;Help managing cup/glass;Help with picking up utensils   Eating Set Up Assist For: Opening containers   Helper Brings Food to Mouth: Occasionally   Cognition Comprehension Comprehension assist level: Understands basic 50 - 74% of the time/ requires cueing 25 - 49% of the time  Expression   Expression assist level: Expresses basic 50 - 74% of the time/requires cueing 25 - 49% of the time. Needs to repeat parts of sentences.  Social Interaction Social Interaction assist level: Interacts appropriately 25 - 49% of time - Needs frequent redirection.  Problem Solving Problem solving assist level: Solves basic 25 - 49% of the time - needs direction more than half the time to initiate, plan or complete simple activities  Memory Memory assist level: Recognizes or recalls 25 - 49% of the time/requires cueing 50 - 75% of the time    Pain No/Denies Pain   Therapy/Group: Individual Therapy  Esmeralda Malay 12/09/2017, 3:17 PM

## 2017-12-09 NOTE — Plan of Care (Signed)
  Problem: Consults Goal: RH STROKE PATIENT EDUCATION Description See Patient Education module for education specifics  Outcome: Progressing Goal: Nutrition Consult-if indicated Outcome: Progressing   Problem: RH BOWEL ELIMINATION Goal: RH STG MANAGE BOWEL WITH ASSISTANCE Description STG Manage Bowel with mod  Assistance.   Outcome: Progressing Goal: RH STG MANAGE BOWEL W/MEDICATION W/ASSISTANCE Description STG Manage Bowel with Medication with  [place].  Outcome: Progressing   Problem: RH BLADDER ELIMINATION Goal: RH STG MANAGE BLADDER WITH ASSISTANCE Description STG Manage Bladder With  Mod Assistance   Outcome: Progressing   Problem: RH SKIN INTEGRITY Goal: RH STG SKIN FREE OF INFECTION/BREAKDOWN Description Patient and family will be able to verbalize understanding of preventing breakdown   Outcome: Progressing Goal: RH STG MAINTAIN SKIN INTEGRITY WITH ASSISTANCE Description STG Maintain Skin Integrity With  [place].  Outcome: Progressing   Problem: RH SAFETY Goal: RH STG ADHERE TO SAFETY PRECAUTIONS W/ASSISTANCE/DEVICE Description STG Adhere to Safety Precautions With  Mod Assistance/Device.   Outcome: Progressing Goal: RH STG DECREASED RISK OF FALL WITH ASSISTANCE Description STG Decreased Risk of Fall With  Mod Assistance.   Outcome: Progressing   Problem: RH COGNITION-NURSING Goal: RH STG USES MEMORY AIDS/STRATEGIES W/ASSIST TO PROBLEM SOLVE Description STG Uses Memory Aids/Strategies With mod  Assistance to Problem Solve.   Outcome: Progressing   Problem: RH PAIN MANAGEMENT Goal: RH STG PAIN MANAGED AT OR BELOW PT'S PAIN GOAL Description Less than 3   Outcome: Progressing   Problem: RH KNOWLEDGE DEFICIT Goal: RH STG INCREASE KNOWLEDGE OF DYSPHAGIA/FLUID INTAKE Description Patient and family will verbalize understanding of prevention of aspiration prior to discharge.  Outcome: Progressing

## 2017-12-09 NOTE — Progress Notes (Signed)
Physical Therapy Session Note  Patient Details  Name: Dakota [name] MRN: [id number] Date of Birth: 20-Nov-1928  Today's Date: 12/09/2017 PT Individual Time: [phone]   75 min   Short Term Goals: Week 1:  PT Short Term Goal 1 (Week 1): Patient to be able to roll and perform supine to sit/sit to supine transfers with ModA  PT Short Term Goal 2 (Week 1): Patient to be able to perform stand pivot transfer with ModA  PT Short Term Goal 3 (Week 1): Patient to be able to ambulate at least 49f with mod assist of 1 and LRAD  PT Short Term Goal 4 (Week 1): Patient to be able to propel wheelchair at least 766fwith MinA without rest break   Skilled Therapeutic Interventions/Progress Updates:   Pt received sitting in WC and agreeable to PT  Kinetron: sitting reciprocal marches. 4x 45  Standing balacne 3 x 1 min  Standing reciprocal marches. 4 x 30 sec  Min assist throughout for improved upright posture and increased WB throughout the RLE t maintain neutral midline positioning.    Gait with RW and kicking yoga block on the L x 14042fith min-mod assist from PT. Pt noted to have improved step length compared to previous sessions.  Gait training without AD with LUE held in flexion beyond 90deg to extend R side trunk x [id number] x 60ft42fweave through furniture obstacle course with min assist from PT. Pt continued to have improved upright posture, step length on the R and weight shifting to the R with all gait training compared to previous treatments   Seated truck rotation to the R with the LUE 2x 10 with resistance from PT. Attempted to perform with therapy ball, but poor attention to the LUE compared to manual resistance.   PT applied tband to L wheel rim of WC. WC mobility training x 50ft 6f mod assist to force attention to the LUE and prevent veering to the L.   Patient returned to room and left sitting in [facility] bell in reach and all needs met.         Therapy  Documentation Precautions:  Precautions Precautions: Fall Precaution Comments: right gaze, left hemiparesis, left lean /pushing Restrictions Weight Bearing Restrictions: No Pain: denies   See Function Navigator for Current Functional Status.   Therapy/Group: Individual Therapy  AustinLorie Phenix2019, 10:21 AM

## 2017-12-09 NOTE — Progress Notes (Signed)
Occupational Therapy Session Note  Patient Details  Name: Dakota [name] MRN: [id number] Date of Birth: [birth year]  Today's Date: 12/09/2017 OT Individual Time: 2800-3491 OT Individual Time Calculation (min): 75 min    Short Term Goals: Week 1:  OT Short Term Goal 1 (Week 1): Pt will be able to sit to EOB with mod A of 1 in prep for self care. OT Short Term Goal 2 (Week 1): Pt will be able to maintain static sitting EOB with min A in prep for self care. OT Short Term Goal 3 (Week 1): Pt will be able to use LUE to wash abdomen and chest with min cues. OT Short Term Goal 5 (Week 1): Pt will identify items in his left visual field with min cues.   Skilled Therapeutic Interventions/Progress Updates:    1:1 pt with no c/oo pain. Pt received eating breakfast with NT. OT reorganized tray with all items located on L for visual scanning and L attention. Pt self feeds with OT facilitating LUE use as non dominant support to hold objects/reach for cups and VC for scanning L. Pt sit to stand at sink with MOD A after OT threads BLE. Pt requires MAX cueing to locate grooming items on L of sink. Pt dons shirt with A to orient shirt and thread L sleeve as well as VC for hemi techniques. Exited session iwht pt seated in w/c, call lighti nreach and all needs met  Therapy Documentation Precautions:  Precautions Precautions: Fall Precaution Comments: right gaze, left hemiparesis, left lean /pushing Restrictions Weight Bearing Restrictions: No General:    See Function Navigator for Current Functional Status.   Therapy/Group: Individual Therapy  Tonny Branch 12/09/2017, 9:32 AM

## 2017-12-10 ENCOUNTER — Inpatient Hospital Stay (HOSPITAL_COMMUNITY): Payer: Medicare Other

## 2017-12-10 ENCOUNTER — Inpatient Hospital Stay (HOSPITAL_COMMUNITY): Payer: Medicare Other | Admitting: *Deleted

## 2017-12-10 NOTE — Plan of Care (Signed)
  Problem: Consults Goal: RH STROKE PATIENT EDUCATION Description See Patient Education module for education specifics  Outcome: Progressing Goal: Nutrition Consult-if indicated Outcome: Progressing   Problem: RH BOWEL ELIMINATION Goal: RH STG MANAGE BOWEL WITH ASSISTANCE Description STG Manage Bowel with mod  Assistance.   Outcome: Progressing Goal: RH STG MANAGE BOWEL W/MEDICATION W/ASSISTANCE Description STG Manage Bowel with Medication with  [place].  Outcome: Progressing   Problem: RH BLADDER ELIMINATION Goal: RH STG MANAGE BLADDER WITH ASSISTANCE Description STG Manage Bladder With  Mod Assistance   Outcome: Progressing   Problem: RH SKIN INTEGRITY Goal: RH STG SKIN FREE OF INFECTION/BREAKDOWN Description Patient and family will be able to verbalize understanding of preventing breakdown   Outcome: Progressing Goal: RH STG MAINTAIN SKIN INTEGRITY WITH ASSISTANCE Description STG Maintain Skin Integrity With  [place].  Outcome: Progressing   Problem: RH SAFETY Goal: RH STG ADHERE TO SAFETY PRECAUTIONS W/ASSISTANCE/DEVICE Description STG Adhere to Safety Precautions With  Mod Assistance/Device.   Outcome: Progressing Goal: RH STG DECREASED RISK OF FALL WITH ASSISTANCE Description STG Decreased Risk of Fall With  Mod Assistance.   Outcome: Progressing   Problem: RH COGNITION-NURSING Goal: RH STG USES MEMORY AIDS/STRATEGIES W/ASSIST TO PROBLEM SOLVE Description STG Uses Memory Aids/Strategies With mod  Assistance to Problem Solve.   Outcome: Progressing   Problem: RH PAIN MANAGEMENT Goal: RH STG PAIN MANAGED AT OR BELOW PT'S PAIN GOAL Description Less than 3   Outcome: Progressing   Problem: RH KNOWLEDGE DEFICIT Goal: RH STG INCREASE KNOWLEDGE OF DYSPHAGIA/FLUID INTAKE Description Patient and family will verbalize understanding of prevention of aspiration prior to discharge.  Outcome: Progressing

## 2017-12-10 NOTE — Progress Notes (Signed)
performed water protocol twice. Pt was able to drink 54mL at 10am. Pt requested to stop water protocol. 2nd water protocol performed at 1600. Pt was able to tolerate 130mL. 4pm water protocol ended as the pt began to cough. Lungs sound are clear. Continue plan of care.  Dakota [name] W Dakota [name]

## 2017-12-10 NOTE — Progress Notes (Signed)
 PHYSICAL MEDICINE & REHABILITATION     PROGRESS NOTE  Subjective/Complaints:  Patient is lying in bed this morning.  He feels well.  He has no complaints this morning.  Objective: Vital Signs:  Physical Exam:  BP 126/82 (BP Location: Right Arm)   Pulse 87   Temp (!) 97 F (36.1 C) (Oral)   Resp 18   Ht 5\' 7"  (1.702 m)   Wt 77.3 kg   SpO2 97%   BMI 26.69 kg/m  Elderly, male in no acute distress.  Sitting up in a chair.  HEENT exam atraumatic, normocephalic, neck supple.  No jugular venous distention Chest clear to auscultation Cardiac exam S1-S2 regular Abdominal exam active bowel sounds, soft Extremities without significant edema.  Assessment/Plan: 1. Functional deficits secondary to right infarct with GI bleed    Medical Problem List and Plan: 1.  Right lean, left-sided weakness,dysphagia, cognitive deficits and difficulty standing secondary to right MCA infarct with GI bleed.  Continue CIR 2.  DVT Prophylaxis/Anticoagulation: Mechanical:  Antiembolism stockings, knee (TED hose) Bilateral lower extremities 3. Pain Management: tylenol prn 4. Mood: LCSW to follow for evaluation and support as needed.  5. Neuropsych: This patient is not capable of making decisions on his own behalf. 6. Skin/Wound Care: routine pressure relief measures.  7. Fluids/Electrolytes/Nutrition: Monitor I/O.  Basic Metabolic Panel:    Component Value Date/Time   NA 140 12/08/2017 0551   K 4.1 12/08/2017 0551   CL 101 12/08/2017 0551   CO2 28 12/08/2017 0551   BUN 20 12/08/2017 0551   CREATININE 1.03 12/08/2017 0551   GLUCOSE 111 (H) 12/08/2017 0551   CALCIUM 9.0 12/08/2017 0551    8. ABLA due to duodenal ulcer/GIB:   Monitor H/H serial checks.   Increased protonix to 40 mg bid.  Hemoglobin 10.4 on 8/9 9.  Chronic A fib: Monitor HR bid. No anticoagulation for now. No medications due to hypotension.  10. Delirium/sundowning: Currently controlled.  Nurses report no  difficulties.   11. Constipation  I improved. 12. Prediabetes  No concerns 13. Hypoalbuminemia  Supplement initiated on 8/6 14. Blood pressure: Adequately controlled.  LOS (Days) 6 A FACE TO FACE EVALUATION WAS PERFORMED  Bruce H Swords 12/10/2017 9:06 AM

## 2017-12-10 NOTE — Progress Notes (Signed)
Occupational Therapy Session Note  Patient Details  Name: Dakota [name] MRN: [id number] Date of Birth: [birth year]  Today's Date: 12/10/2017 OT Individual Time: 0921-1000 OT Individual Time Calculation (min): 39 min    Short Term Goals: Week 1:  OT Short Term Goal 1 (Week 1): Pt will be able to sit to EOB with mod A of 1 in prep for self care. OT Short Term Goal 2 (Week 1): Pt will be able to maintain static sitting EOB with min A in prep for self care. OT Short Term Goal 3 (Week 1): Pt will be able to use LUE to wash abdomen and chest with min cues. OT Short Term Goal 5 (Week 1): Pt will identify items in his left visual field with min cues.   Skilled Therapeutic Interventions/Progress Updates:    1:1. Pt received completing water protocol with RN. No c/o pain. Pt incontinent of bladder in brief. Pt able to wash peri area. OT attempts to have pt bridge hips to change/don new brief and pants however unable to clear buttocks off bed. Pt supine>sitting EOB with CGA. Pt sit to stand with MOD A +2  No ADfor steadying and OT advances pants past hips. Pt stand pivot transfer with MOD A and VC for anterior weight shift to correct posterior bias. Pt brushes hair at sink in standing with min A for sit to stant/steadying. OT places LUE on sink for weight bearing and provides HOH A/tactile cues for LUE to brush L head. Pt dons shirt with MAX VC for hemi technique and A to thread LUE and pull shirt down L side. Exited session with pt seated in w/c, call light in reach and son present to supervise.  Therapy Documentation Precautions:  Precautions Precautions: Fall Precaution Comments: right gaze, left hemiparesis, left lean /pushing Restrictions Weight Bearing Restrictions: No General:    See Function Navigator for Current Functional Status.   Therapy/Group: Individual Therapy  Tonny Branch 12/10/2017, 10:02 AM

## 2017-12-10 NOTE — Progress Notes (Signed)
Physical Therapy Session Note  Patient Details  Name: Dakota [name] MRN: [id number] Date of Birth: 02-04-1929  Today's Date: 12/10/2017 PT Individual Time: 1105-1205 PT Individual Time Calculation (min): 60 min   Short Term Goals: Week 1:  PT Short Term Goal 1 (Week 1): Patient to be able to roll and perform supine to sit/sit to supine transfers with ModA  PT Short Term Goal 2 (Week 1): Patient to be able to perform stand pivot transfer with ModA  PT Short Term Goal 3 (Week 1): Patient to be able to ambulate at least 41ft with mod assist of 1 and LRAD  PT Short Term Goal 4 (Week 1): Patient to be able to propel wheelchair at least 48ft with MinA without rest break  Week 2:     Skilled Therapeutic Interventions/Progress Updates:  Tx focused on functional mobility training, gait and balance. Pt fatigued, up in chair.  Pt propelled WC c100' with mod A, unable to safely use LUE due to attention.  Sit<>stand with RW and balance training with bil UE support, mirror for visual feedback. Manual facilitation for trunk control, upright head and trunk. Lateral weight shifts for increased midline orientation and cues for sustained holds  Seated unsupported UE shoulder raises x20, diagonal ball reaches with bil UEs 2x10 each direction,   Standing NMR for ball kicks bil x40min with Mod A for balance due to R flexion  Gait training 3x150' with +2 assist for 1 person under L shoulder to facilitate upright trunk, 1 person HHA on R. Initially, pt with decreased clearance on LLE, but improved with cues for "kick."  Toilet transfer with Mod A overall with continent BM and urine.  Pt needs solid Mod A for standing balance during functional tasks requiring attention.  Pt left up in [facility] with son and all needs in reach.      Therapy Documentation Precautions:  Precautions Precautions: Fall Precaution Comments: right gaze, left hemiparesis, left lean /pushing Restrictions Weight Bearing Restrictions:  No General:   Vital Signs:  Pain: none   See Function Navigator for Current Functional Status.   Therapy/Group: Individual Therapy  Dakota [name] [name], PT, DPT  12/10/2017, 11:36 AM

## 2017-12-11 ENCOUNTER — Inpatient Hospital Stay (HOSPITAL_COMMUNITY): Payer: Medicare Other | Admitting: Occupational Therapy

## 2017-12-11 ENCOUNTER — Encounter: Payer: Self-pay | Admitting: Family Medicine

## 2017-12-11 ENCOUNTER — Inpatient Hospital Stay (HOSPITAL_COMMUNITY): Payer: Medicare Other | Admitting: Speech Pathology

## 2017-12-11 ENCOUNTER — Inpatient Hospital Stay (HOSPITAL_COMMUNITY): Payer: Medicare Other

## 2017-12-11 NOTE — Progress Notes (Signed)
Occupational Therapy Session Note  Patient Details  Name: Dakota [name] MRN: [id number] Date of Birth: Aug 19, 1928  Today's Date: 12/11/2017 OT Individual Time: 8003-4917 OT Individual Time Calculation (min): 45 min    Short Term Goals: Week 1:  OT Short Term Goal 1 (Week 1): Pt will be able to sit to EOB with mod A of 1 in prep for self care. OT Short Term Goal 2 (Week 1): Pt will be able to maintain static sitting EOB with min A in prep for self care. OT Short Term Goal 3 (Week 1): Pt will be able to use LUE to wash abdomen and chest with min cues. OT Short Term Goal 5 (Week 1): Pt will identify items in his left visual field with min cues.   Skilled Therapeutic Interventions/Progress Updates:    Pt completed functional mobility from the wheelchair into the bathroom with mod assist to start session.  Max assist for clothing management and for changing his brief.  He was not successful with attempt to void or have bowel movement, so transferred out to the wheelchair to rest.  Next hand him ambulate to the therapy gym with total +2 (pt 50%) hand held assist.  Pt still demonstrate pushing the left side, scissoring of the LLE and flexed trunk and head.  Pt also with head turn to the right as well, requiring max demonstrational cueing to find midline.  Had pt work on functional reaching with the LUE in sitting and use of the RUE in standing to pick up clothes pins and place on horizontal bars on the right side of midline, incorporating lateral weightshift to the pushing side.  Had him scan left of midline to pick up the clothespins as well.  Occasional difficulty noted picking them up and keeping them in his left hand secondary to motor planning deficits and sensory deficits.  Finished session with ambulation back to the room at the same level as above and pt transferring back to the bed.  Call button and phone in reach with pt's son present as well and bed alarm in place.    Therapy  Documentation Precautions:  Precautions Precautions: Fall Precaution Comments: right gaze, left hemiparesis, left lean /pushing Restrictions Weight Bearing Restrictions: No  Pain: Pain Assessment Pain Scale: Faces Pain Score: 0-No pain ADL: See Function Navigator for Current Functional Status.   Therapy/Group: Individual Therapy  Dakota [name] OTR/L 12/11/2017, 4:03 PM

## 2017-12-11 NOTE — Progress Notes (Signed)
Occupational Therapy Session Note  Patient Details  Name: Dakota [name] MRN: [id number] Date of Birth: 1928/11/08  Today's Date: 12/11/2017 OT Individual Time: 1007-1100 OT Individual Time Calculation (min): 53 min    Short Term Goals: Week 1:  OT Short Term Goal 1 (Week 1): Pt will be able to sit to EOB with mod A of 1 in prep for self care. OT Short Term Goal 2 (Week 1): Pt will be able to maintain static sitting EOB with min A in prep for self care. OT Short Term Goal 3 (Week 1): Pt will be able to use LUE to wash abdomen and chest with min cues. OT Short Term Goal 5 (Week 1): Pt will identify items in his left visual field with min cues.   Skilled Therapeutic Interventions/Progress Updates:    Pt completed bathing and dressing sit to stand from the EOB during session.  Mod assist for dynamic sitting balance EOB when completing bathing secondary to lean to the left with LOB when attempting to cross LEs for LB bathing or when reaching down to his feet.  He needed max demonstrational cueing for re-direction to work on maintaining his balance throughout session, with target on the right side to bring his shoulder to.  He used the LUE spontaneously throughout the session but needed max instructional cueing for sustained attention as he would frequently drop the washcloth and was not aware of it secondary to sensory and attention deficits.  He was able to thread his pants over his feet with min assist and mod instructional cueing.  He needed mod assist for sit to stand in order to pull them up.  He attempted to donn gripper socks, but secondary to motor planning he could not successfully grasp them with the left hand, and needed mod facilitation to donn them.  Finished session with call button and phone in reach and pt in the wheelchair.  Max assist this session for stand pivot transfer to the wheelchair to the left of the bed.    Therapy Documentation Precautions:  Precautions Precautions:  Fall Precaution Comments: right gaze, left hemiparesis, left lean /pushing Restrictions Weight Bearing Restrictions: No  Pain: Pain Assessment Pain Scale: Faces Faces Pain Scale: No hurt ADL: See Function Navigator for Current Functional Status.   Therapy/Group: Individual Therapy  Jerry Clyne OTR/L 12/11/2017, 12:36 PM

## 2017-12-11 NOTE — Progress Notes (Signed)
[place] PHYSICAL MEDICINE & REHABILITATION     PROGRESS NOTE  Subjective/Complaints:  A little restless last night. No new complaints otherwise  ROS: Patient denies fever, rash, sore throat, blurred vision, nausea, vomiting, diarrhea, cough, shortness of breath or chest pain, joint or back pain, headache, or mood change.    Objective: Vital Signs: Blood pressure 129/88, pulse 98, temperature 98.5 F (36.9 C), temperature source Oral, resp. rate 17, height 5\' 7"  (1.702 m), weight 75.8 kg, SpO2 98 %. No results found. No results for input(s): WBC, HGB, HCT, PLT in the last 72 hours. No results for input(s): NA, K, CL, GLUCOSE, BUN, CREATININE, CALCIUM in the last 72 hours.  Invalid input(s): CO CBG (last 3)  No results for input(s): GLUCAP in the last 72 hours.  Wt Readings from Last 3 Encounters:  12/11/17 75.8 kg  12/04/17 78.1 kg  11/21/17 81.7 kg    Physical Exam:  BP 129/88 (BP Location: Right Arm)   Pulse 98   Temp 98.5 F (36.9 C) (Oral)   Resp 17   Ht 5\' 7"  (1.702 m)   Wt 75.8 kg   SpO2 98%   BMI 26.17 kg/m  Constitutional: No distress . Vital signs reviewed. HEENT: EOMI, oral membranes moist Neck: supple Cardiovascular: IRR without murmur. No JVD    Respiratory: CTA Bilaterally without wheezes or rales. Normal effort    GI: BS +, non-tender, non-distended  Musculoskeletal: No edema or tenderness in extremities  Neurological: He is alert  Dysarthria Oriented 2 Motor: Right upper extremity: 5/5 proximal distal Left upper extremity: 4+/5 proximal to distal Bilateral lower extremities: Hip flexion 3+/5, knee extension 3/5, ankle dorsiflexion 4+/5 (essentially stable) Left inattention present Skin: Skin is warm and dry.  Psychiatric: slow to process.   Assessment/Plan: 1. Functional deficits secondary to right infarct with GI bleed which require 3+ hours per day of interdisciplinary therapy in a comprehensive inpatient rehab setting. Physiatrist is  providing close team supervision and 24 hour management of active medical problems listed below. Physiatrist and rehab team continue to assess barriers to discharge/monitor patient progress toward functional and medical goals.  Function:  Bathing Bathing position   Position: Sitting EOB  Bathing parts Body parts bathed by patient: Right arm, Left arm, Chest, Abdomen(UB only) Body parts bathed by helper: Front perineal area, Buttocks, Back, Left lower leg, Right lower leg  Bathing assist Assist Level: 2 helpers      Upper Body Dressing/Undressing Upper body dressing   What is the patient wearing?: Pull over shirt/dress     Pull over shirt/dress - Perfomed by patient: Thread/unthread left sleeve, Put head through opening Pull over shirt/dress - Perfomed by helper: Thread/unthread right sleeve, Pull shirt over trunk        Upper body assist Assist Level: Touching or steadying assistance(Pt > 75%)      Lower Body Dressing/Undressing Lower body dressing   What is the patient wearing?: Non-skid slipper socks, Pants, Ted Hose     Pants- Performed by patient: Thread/unthread right pants leg Pants- Performed by helper: Thread/unthread right pants leg, Thread/unthread left pants leg, Pull pants up/down   Non-skid slipper socks- Performed by helper: Don/doff right sock, Don/doff left sock               TED Hose - Performed by helper: Don/doff right TED hose, Don/doff left TED hose  Lower body assist Assist for lower body dressing: 2 Helpers      Toileting Toileting Toileting activity did not  occur: No continent bowel/bladder event Toileting steps completed by patient: Adjust clothing prior to toileting Toileting steps completed by helper: Adjust clothing prior to toileting, Performs perineal hygiene, Adjust clothing after toileting Toileting Assistive Devices: Grab bar or rail  Toileting assist Assist level: Touching or steadying assistance (Pt.75%)   Transfers Chair/bed  transfer   Chair/bed transfer method: Stand pivot Chair/bed transfer assist level: Moderate assist (Pt 50 - 74%/lift or lower) Chair/bed transfer assistive device: Walker, Armrests Mechanical lift: Ecologist     Max distance: 129ft  Assist level: Moderate assist (Pt 50 - 74%)   Wheelchair   Type: Manual Max wheelchair distance: 57ft Assist Level: Moderate assistance (Pt 50 - 74%)  Cognition Comprehension Comprehension assist level: Understands basic 50 - 74% of the time/ requires cueing 25 - 49% of the time  Expression Expression assist level: Expresses basic 25 - 49% of the time/requires cueing 50 - 75% of the time. Uses single words/gestures.  Social Interaction Social Interaction assist level: Interacts appropriately 25 - 49% of time - Needs frequent redirection.  Problem Solving Problem solving assist level: Solves basic 25 - 49% of the time - needs direction more than half the time to initiate, plan or complete simple activities  Memory Memory assist level: Recognizes or recalls 25 - 49% of the time/requires cueing 50 - 75% of the time    Medical Problem List and Plan: 1.  Right lean, left-sided weakness,dysphagia, cognitive deficits and difficulty standing secondary to right MCA infarct with GI bleed.  Continue CIR 2.  DVT Prophylaxis/Anticoagulation: Mechanical:  Antiembolism stockings, knee (TED hose) Bilateral lower extremities 3. Pain Management: tylenol prn 4. Mood: LCSW to follow for evaluation and support as needed.  5. Neuropsych: This patient is not capable of making decisions on his own behalf. 6. Skin/Wound Care: routine pressure relief measures.  7. Fluids/Electrolytes/Nutrition: Monitor I/O.   BMP within acceptable range on 8/9 8. ABLA due to duodenal ulcer/GIB:   Monitor H/H serial checks.   Increased protonix to 40 mg bid.  Hemoglobin 10.4 on 8/9 9.  Chronic A fib: Monitor HR bid. No anticoagulation for now. No medications due to  hypotension.  10. Delirium/sundowning: Continue Depakote tid    Will make adjustments as needed  Seroquel DC'd on 8/8 due to oversedation, trazodone ineffective, restoril started on 8/9 11. Constipation  Increased senna to bid.  Had bm 8/11   12. Prediabetes  Reasonable control 13. Hypoalbuminemia  Supplement initiated on 8/6 14. Blood pressure  Labile on 8/9  LOS (Days) 7 A FACE TO FACE EVALUATION WAS PERFORMED  Meredith Staggers 12/11/2017 11:42 AM

## 2017-12-11 NOTE — Progress Notes (Signed)
Speech Language Pathology Weekly Progress and Session Note  Patient Details  Name: Dakota [name] MRN: [id number] Date of Birth: 04-06-1929  Beginning of progress report period: December 04, 2017 End of progress report period: December 11, 2017  Today's Date: 12/11/2017 SLP Individual Time: 9150-5697 SLP Individual Time Calculation (min): 45 min  Short Term Goals: Week 1: SLP Short Term Goal 1 (Week 1): Pt will consume dys 1 textures and nectar thick liquids via teaspoon with max assist multimodal cues and minimal overt s/s of aspiration.  SLP Short Term Goal 1 - Progress (Week 1): Met SLP Short Term Goal 2 (Week 1): Pt will utilize external aids to reorient to place, date, and situation with min assist multimodal cues.  SLP Short Term Goal 2 - Progress (Week 1): Not met SLP Short Term Goal 3 (Week 1): Pt will sustain his attention to basic, familiar tasks for ~5 minute intervals with max assist multimodal cues.  SLP Short Term Goal 3 - Progress (Week 1): Met SLP Short Term Goal 4 (Week 1): Pt will slow rate and increase vocal intensity to achieve intelligibility at the word level with mod assist.   SLP Short Term Goal 4 - Progress (Week 1): Met SLP Short Term Goal 5 (Week 1): Pt will locate items to the left of midline during basic familiar tasks in ~50% of opportunities with max assist multimodal cues.  SLP Short Term Goal 5 - Progress (Week 1): Not met SLP Short Term Goal 6 (Week 1): Pt will consume trials of thin via TSP with min overt s/s aspiration in order to indicate readiness for instrumental swallow assessment. SLP Short Term Goal 6 - Progress (Week 1): Met    New Short Term Goals: Week 2: SLP Short Term Goal 1 (Week 2): Pt will consume dys 1 textures and nectar thick liquids via cup with mod assist multimodal cues for use of compensatory swallow strategies (small bite, slow rate).  SLP Short Term Goal 2 (Week 2): Pt will complete basic familiar problem solving tasks related to ADLs  with Mod A cues.  SLP Short Term Goal 3 (Week 2): Pt will sustain attention to basic, familiar tasks for ~10 minute intervals with mod assist multimodal cues.  SLP Short Term Goal 4 (Week 2): Pt will slow rate and increase vocal intensity to achieve ~50%  intelligibility at the phrase level with mod assist.   SLP Short Term Goal 5 (Week 2): Pt will locate items to the left of midline during basic familiar tasks in ~50% of opportunities with max assist multimodal cues.  SLP Short Term Goal 6 (Week 2): Pt will consume thin liquids via TSP with min overt s/s of aspiration and Mod A cues for use of compensatory swallow strategies.   Weekly Progress Updates: Pt has made slow progress this reporting period as evidenced by meeting 4 of 6 STGs and recent upgrade to thin liquids via water protocol only. Overall, pt continues to require Max A to Mod A for basic cognitive functions (sustained attention, basic problem solving, attention to left), speech intelligibility and use of compensatory strategies when consuming thin liquids and upgraded trials of dysphagia 2, Skilled St is required to target the above mentioned deficits. Anticipate that pt will require Min A to Mod A at discharge in these areas and goals adjusted accordingly.       Daily Session  Skilled Therapeutic Interventions: Skilled treatment session focuse don dysphagia goals and education with son on pt's cognitive and oropharyngeal deficits.  SLP facilitated session by providing skilled observation of pt consuming thin liquids via cup sips with Mod A for small sips. Pt with no overt s/s of aspiration until last large sip that was not cued by SLP for small sip. Son states that pt's wife is [employer] [facility] and they currently have help during the day with family staying at night. Petra Kuba of pt's deficits reviewed with son and he provides family plans to take pt home and provide care for pt and his wife despite level of care this will require. Son voices  understanding and is present during session to witness pt's overall confusion and inability to perform basic cognitive tasks without Max A to Mod A.    Function:   Eating Eating   Modified Consistency Diet: No(trials of thin with SLP) Eating Assist Level: More than reasonable amount of time   Eating Set Up Assist For: Opening containers Helper Scoops Food on Utensil: Occasionally Helper Brings Food to Mouth: Occasionally   Cognition Comprehension Comprehension assist level: Understands basic 25 - 49% of the time/ requires cueing 50 - 75% of the time;Understands basic 50 - 74% of the time/ requires cueing 25 - 49% of the time  Expression   Expression assist level: Expresses basic 25 - 49% of the time/requires cueing 50 - 75% of the time. Uses single words/gestures.  Social Interaction Social Interaction assist level: Interacts appropriately 25 - 49% of time - Needs frequent redirection.  Problem Solving Problem solving assist level: Solves basic 25 - 49% of the time - needs direction more than half the time to initiate, plan or complete simple activities  Memory Memory assist level: Recognizes or recalls less than 25% of the time/requires cueing greater than 75% of the time   General    Pain    Therapy/Group: Individual Therapy  Tiearra Colwell 12/11/2017, 3:17 PM

## 2017-12-11 NOTE — Progress Notes (Signed)
Physical Therapy Session Note  Patient Details  Name: Dakota [name] MRN: [id number] Date of Birth: 1929/03/10  Today's Date: 12/11/2017 PT Individual Time: 1423-2009 PT Individual Time Calculation (min): 56 min   Short Term Goals: Week 1:  PT Short Term Goal 1 (Week 1): Patient to be able to roll and perform supine to sit/sit to supine transfers with ModA  PT Short Term Goal 2 (Week 1): Patient to be able to perform stand pivot transfer with ModA  PT Short Term Goal 3 (Week 1): Patient to be able to ambulate at least 29f with mod assist of 1 and LRAD  PT Short Term Goal 4 (Week 1): Patient to be able to propel wheelchair at least 74fwith MinA without rest break   Skilled Therapeutic Interventions/Progress Updates: Pt presented in w/c agreeable to therapy. Pt transported to rehab gym total assist. Performed stand pivot transfer with RW modA with verbal cues for sequencing. Pt particpated in stable balanace activities with emphasis on midline orientation including reaching with LUE. Pt performed standing balance activities in conjunction with midline orientation. Pt participated in ball kicks for forced use of LLE, then attempted alternating ball kicks with pt requiring mod verbal cues and visual feedback for midline correction. Pt became emotional during treatment session with PTA and rehab tech providing emotional support. Participated in ambulation x 10061fith PTA under L shoulder for midline correction and rehab tech providing HHA to RUE. Pt initially required mod verbal cues for increasing step length then demonstrating improved carryover. After seated rest pt ambulated back to room in same manner with pt intermittently requiring verbal cues for increasing step length and LLE foot placement. Pt returned to w/c and set up with belt alarm on and current needs met.      Therapy Documentation Precautions:  Precautions Precautions: Fall Precaution Comments: right gaze, left hemiparesis, left  lean /pushing Restrictions Weight Bearing Restrictions: No   See Function Navigator for Current Functional Status.   Therapy/Group: Individual Therapy  Maalle Starrett  Korde Jeppsen, PTA  12/11/2017, 12:19 PM

## 2017-12-12 ENCOUNTER — Inpatient Hospital Stay (HOSPITAL_COMMUNITY): Payer: Medicare Other | Admitting: Speech Pathology

## 2017-12-12 ENCOUNTER — Inpatient Hospital Stay (HOSPITAL_COMMUNITY): Payer: Medicare Other

## 2017-12-12 ENCOUNTER — Inpatient Hospital Stay (HOSPITAL_COMMUNITY): Payer: Medicare Other | Admitting: *Deleted

## 2017-12-12 NOTE — Progress Notes (Signed)
Speech Language Pathology Daily Session Note  Patient Details  Name: Dakota [name] MRN: [id number] Date of Birth: 28-Jul-1928  Today's Date: 12/12/2017 SLP Individual Time: 6295-2841 SLP Individual Time Calculation (min): 55 min  Short Term Goals: Week 2: SLP Short Term Goal 1 (Week 2): Pt will consume dys 1 textures and nectar thick liquids via cup with mod assist multimodal cues for use of compensatory swallow strategies (small bite, slow rate).  SLP Short Term Goal 2 (Week 2): Pt will complete basic familiar problem solving tasks related to ADLs with Mod A cues.  SLP Short Term Goal 3 (Week 2): Pt will sustain attention to basic, familiar tasks for ~10 minute intervals with mod assist multimodal cues.  SLP Short Term Goal 4 (Week 2): Pt will slow rate and increase vocal intensity to achieve ~50%  intelligibility at the phrase level with mod assist.   SLP Short Term Goal 5 (Week 2): Pt will locate items to the left of midline during basic familiar tasks in ~50% of opportunities with max assist multimodal cues.  SLP Short Term Goal 6 (Week 2): Pt will consume thin liquids via TSP with min overt s/s of aspiration and Mod A cues for use of compensatory swallow strategies.   Skilled Therapeutic Interventions: Skilled treatment session focused on dysphagia and cognitive goals. SLP facilitated session by providing Mod-Max A verbal cues for patient to self-monitor and correct oral residue throughout meal. Patient initially consumed thin liquids via cup without overt s/s of aspiration, however, as patient's oral residue increased as the meal progressed, patient demonstrated consistent coughing despite utilization of small sips. Recommend patient remain on nectar-thick liquids with meals but upgrade water protocol to thins by cup. Patient and his son verbalized understanding of recommendations. Patient demonstrated sustained attention to self-feeding for ~30 minutes with Min A verbal cues. Patient also  required Min-Mod A verbal cues for problem solving throughout meal. Patient left upright in wheelchair with son present. Continue with current plan of care.      Function:  Eating Eating   Modified Consistency Diet: No Eating Assist Level: More than reasonable amount of time           Cognition Comprehension Comprehension assist level: Understands basic 25 - 49% of the time/ requires cueing 50 - 75% of the time  Expression   Expression assist level: Expresses basic 25 - 49% of the time/requires cueing 50 - 75% of the time. Uses single words/gestures.  Social Interaction Social Interaction assist level: Interacts appropriately 25 - 49% of time - Needs frequent redirection.  Problem Solving Problem solving assist level: Solves basic 25 - 49% of the time - needs direction more than half the time to initiate, plan or complete simple activities  Memory Memory assist level: Recognizes or recalls less than 25% of the time/requires cueing greater than 75% of the time    Pain Pain Assessment Pain Scale: 0-10 Pain Score: 0-No pain  Therapy/Group: Individual Therapy  Raedyn Wenke 12/12/2017, 12:26 PM

## 2017-12-12 NOTE — Progress Notes (Signed)
Physical Therapy Weekly Progress Note  Patient Details  Name: AIRAM HEIDECKER MRN: [id number] Date of Birth: 1928/05/23  Beginning of progress report period: December 04, 2017 End of progress report period: December 12, 2017  Today's Date: 12/12/2017 PT Individual Time: 1303-1406 PT Individual Time Calculation (min): 63 min   Patient has met 2 of 4 short term goals.  Pt has made progress during current course of therapy. Pt has required fluctuating levels of assist during current course as based on level of lethargy. When pt is alert pt may require as little as minA however can require up to maxA at times for bed mobility, transfers, standing balance, and gait. Pt however continues to have difficulty with coordination during w/c propulsion and continues to require modA for w/c mobility.   Patient continues to demonstrate the following deficits muscle weakness, impaired timing and sequencing, unbalanced muscle activation, decreased coordination and decreased motor planning and decreased initiation, decreased attention, decreased awareness, decreased problem solving, decreased safety awareness, decreased memory and delayed processing and therefore will continue to benefit from skilled PT intervention to increase functional independence with mobility.  Patient progressing toward long term goals..  Continue plan of care.  PT Short Term Goals Week 1:  PT Short Term Goal 1 (Week 1): Patient to be able to roll and perform supine to sit/sit to supine transfers with ModA  PT Short Term Goal 1 - Progress (Week 1): Progressing toward goal PT Short Term Goal 2 (Week 1): Patient to be able to perform stand pivot transfer with ModA  PT Short Term Goal 2 - Progress (Week 1): Partly met PT Short Term Goal 3 (Week 1): Patient to be able to ambulate at least 44f with mod assist of 1 and LRAD  PT Short Term Goal 3 - Progress (Week 1): Partly met PT Short Term Goal 4 (Week 1): Patient to be able to propel wheelchair  at least 731fwith MinA without rest break  PT Short Term Goal 4 - Progress (Week 1): Not progressing Week 2:  PT Short Term Goal 1 (Week 2): Patient to perform rolling and sit to supine/supine to sit transfers with ModA PT Short Term Goal 2 (Week 2): Patient to be able to complete stand-pivot transfers with MinA  PT Short Term Goal 3 (Week 2): Patient to be able to ambulate at least 2515fith ModA of 1 and LRAD  PT Short Term Goal 4 (Week 2): Family to be educated and able to demonstrate basic bed mobility and sit to stand transfer mechanics with patient and close S from PT staff   Skilled Therapeutic Interventions/Progress Updates: Pt presented in w/c with son present agreeable to therapy. Pt appeared lethargic this session. Pt transported to rehab gym for energy conservation and performed stand pivot transfer to mat with RW. Pt required increased cues for sequencing compared to previous session. Pt noted to have increased L lean this session and required max multimodal cues to achieve/maintain midline orientation. Pt participated in LLE toe taps to 4 in step initially maintaining midline but requiring mod/maxA for postural correction and midline orientation. Pt also attempted participation in standing balance with reaching for cards. Pt able to reach with LUE for NMR however continued to require mod/max multimodal cues for postural correction. Pt unable to correct L knee flexion and noted increased pushing to L. Pt was able to complete task in sitting for forced use of LUE with intermittent blocking of RUE as pt would attempt to reach. Pt returned  to w/c via stand pivot with maxA and returned to room total A. Pt requesting water and was able to perform oral care at sink with min cues for completing L side of mouth. Pt took x 2 sips of water with pt coughing after second sip. Pt then performed stand pivot to bed modA and required modA for sit to supine due to assist for BLE management. Pt positioned in bed  for comfort and left with nsg present to administer meds with son present.      Therapy Documentation Precautions:  Precautions Precautions: Fall Precaution Comments: right gaze, left hemiparesis, left lean /pushing Restrictions Weight Bearing Restrictions: No General:   Vital Signs: Therapy Vitals Temp: 98.3 F (36.8 C) Temp Source: Oral Pulse Rate: 74 Resp: 17 BP: (!) 100/51 Patient Position (if appropriate): Lying Oxygen Therapy SpO2: 99 % O2 Device: Room Air  See Function Navigator for Current Functional Status.  Therapy/Group: Individual Therapy  Deniece Ree PT, DPT, CBIS  Supplemental Physical Therapist [facility]   Pager [phone]

## 2017-12-12 NOTE — Progress Notes (Signed)
[place] PHYSICAL MEDICINE & REHABILITATION     PROGRESS NOTE  Subjective/Complaints:  States he didn't sleep well again. Sleep chart indicated 5 hours of continuous sleep. Eating breakfast with SLP this morning  ROS: Patient denies fever, rash, sore throat, blurred vision, nausea, vomiting, diarrhea, cough, shortness of breath or chest pain, joint or back pain, headache, or mood change.   Objective: Vital Signs: Blood pressure 131/67, pulse 90, temperature 97.7 F (36.5 C), resp. rate 16, height 5\' 7"  (1.702 m), weight 76.5 kg, SpO2 97 %. No results found. No results for input(s): WBC, HGB, HCT, PLT in the last 72 hours. No results for input(s): NA, K, CL, GLUCOSE, BUN, CREATININE, CALCIUM in the last 72 hours.  Invalid input(s): CO CBG (last 3)  No results for input(s): GLUCAP in the last 72 hours.  Wt Readings from Last 3 Encounters:  12/12/17 76.5 kg  12/04/17 78.1 kg  11/21/17 81.7 kg    Physical Exam:  BP 131/67 (BP Location: Right Arm)   Pulse 90   Temp 97.7 F (36.5 C)   Resp 16   Ht 5\' 7"  (1.702 m)   Wt 76.5 kg   SpO2 97%   BMI 26.41 kg/m  Constitutional: No distress . Vital signs reviewed. HEENT: EOMI, oral membranes moist Neck: supple Cardiovascular: RRR without murmur. No JVD    Respiratory: CTA Bilaterally without wheezes or rales. Normal effort    GI: BS +, non-tender, non-distended  Musculoskeletal: No edema or tenderness in extremities  Neurological: He is alert, HOH Dysarthria Oriented 2 Motor: Right upper extremity: 5/5 proximal distal Left upper extremity: 4+/5 proximal to distal Bilateral lower extremities: Hip flexion 3+/5, knee extension 3/5, ankle dorsiflexion 4+/5 (essentially stable) Left inattention present Skin: Skin is warm and dry.  Psychiatric: slow to process.   Assessment/Plan: 1. Functional deficits secondary to right infarct with GI bleed which require 3+ hours per day of interdisciplinary therapy in a comprehensive inpatient  rehab setting. Physiatrist is providing close team supervision and 24 hour management of active medical problems listed below. Physiatrist and rehab team continue to assess barriers to discharge/monitor patient progress toward functional and medical goals.  Function:  Bathing Bathing position   Position: Sitting EOB  Bathing parts Body parts bathed by patient: Right arm, Left arm, Chest, Abdomen, Right upper leg, Left upper leg Body parts bathed by helper: Left lower leg, Right lower leg, Back  Bathing assist Assist Level: 2 helpers      Upper Body Dressing/Undressing Upper body dressing   What is the patient wearing?: Pull over shirt/dress     Pull over shirt/dress - Perfomed by patient: Thread/unthread left sleeve, Put head through opening Pull over shirt/dress - Perfomed by helper: Thread/unthread right sleeve, Thread/unthread left sleeve, Put head through opening, Pull shirt over trunk        Upper body assist Assist Level: Touching or steadying assistance(Pt > 75%)      Lower Body Dressing/Undressing Lower body dressing   What is the patient wearing?: Pants, Ted Hose, Non-skid slipper socks     Pants- Performed by patient: Thread/unthread right pants leg, Thread/unthread left pants leg Pants- Performed by helper: Pull pants up/down   Non-skid slipper socks- Performed by helper: Don/doff right sock, Don/doff left sock               TED Hose - Performed by helper: Don/doff right TED hose, Don/doff left TED hose  Lower body assist Assist for lower body dressing: 2 Helpers  Toileting Toileting Toileting activity did not occur: No continent bowel/bladder event Toileting steps completed by patient: Adjust clothing prior to toileting Toileting steps completed by helper: Adjust clothing prior to toileting, Performs perineal hygiene, Adjust clothing after toileting Toileting Assistive Devices: Grab bar or rail  Toileting assist Assist level: Two helpers    Transfers Chair/bed transfer   Chair/bed transfer method: Stand pivot Chair/bed transfer assist level: Maximal assist (Pt 25 - 49%/lift and lower) Chair/bed transfer assistive device: Armrests, Walker Mechanical lift: Ecologist     Max distance: 150 Assist level: 2 helpers   Wheelchair   Type: Manual Max wheelchair distance: 72ft Assist Level: Moderate assistance (Pt 50 - 74%)  Cognition Comprehension Comprehension assist level: Understands basic 25 - 49% of the time/ requires cueing 50 - 75% of the time  Expression Expression assist level: Expresses basic 25 - 49% of the time/requires cueing 50 - 75% of the time. Uses single words/gestures.  Social Interaction Social Interaction assist level: Interacts appropriately 25 - 49% of time - Needs frequent redirection.  Problem Solving Problem solving assist level: Solves basic 25 - 49% of the time - needs direction more than half the time to initiate, plan or complete simple activities  Memory Memory assist level: Recognizes or recalls less than 25% of the time/requires cueing greater than 75% of the time    Medical Problem List and Plan: 1.  Right lean, left-sided weakness,dysphagia, cognitive deficits and difficulty standing secondary to right MCA infarct with GI bleed.  Continue CIR 2.  DVT Prophylaxis/Anticoagulation: Mechanical:  Antiembolism stockings, knee (TED hose) Bilateral lower extremities 3. Pain Management: tylenol prn 4. Mood: LCSW to follow for evaluation and support as needed.  5. Neuropsych: This patient is not capable of making decisions on his own behalf. 6. Skin/Wound Care: routine pressure relief measures.  7. Fluids/Electrolytes/Nutrition: Monitor I/O.   BMP within acceptable range on 8/9  -intake fair 8. ABLA due to duodenal ulcer/GIB:   Monitor H/H serial checks.   Increased protonix to 40 mg bid.  Hemoglobin 10.4 on 8/9 9.  Chronic A fib: Monitor HR bid. No anticoagulation for now. No  medications due to hypotension.  10. Delirium/sundowning: Continue Depakote tid      adjustments as needed  Seroquel DC'd on 8/8 due to oversedation, trazodone ineffective, restoril started on 8/9 11. Constipation  Increased senna to bid.  Had bm 8/11   12. Prediabetes  Reasonable control 13. Hypoalbuminemia  Supplement initiated on 8/6 14. Blood pressure  -reasonable control  LOS (Days) 8 A FACE TO FACE EVALUATION WAS PERFORMED  Meredith Staggers 12/12/2017 9:44 AM

## 2017-12-12 NOTE — Progress Notes (Signed)
Occupational Therapy Session Note  Patient Details  Name: Dakota [name] MRN: [id number] Date of Birth: 1928-09-25  Today's Date: 12/12/2017 OT Individual Time: 1000-1100 OT Individual Time Calculation (min): 60 min    Short Term Goals: Week 1:  OT Short Term Goal 1 (Week 1): Pt will be able to sit to EOB with mod A of 1 in prep for self care. OT Short Term Goal 2 (Week 1): Pt will be able to maintain static sitting EOB with min A in prep for self care. OT Short Term Goal 3 (Week 1): Pt will be able to use LUE to wash abdomen and chest with min cues. OT Short Term Goal 5 (Week 1): Pt will identify items in his left visual field with min cues.   Skilled Therapeutic Interventions/Progress Updates:    Pt received sitting up in w/c with son present. Session focused on dynamic sitting balance, L attention and NMR, and self awareness. Pt completed grooming task at sink, requiring vc and tactile cues for thoroughness on L side. Pt demo improvement in spontaneous attention to L side. Pt changed shirt sitting in w/c. Questioning cues attempted to correct pt threading R UE incorrectly, but ultimately requiring manual correction. Increased time required to allow pt to problem solve through UB dressing. Pt was transported to therapy gym and completed squat pivot transfer with mod A, +2 present for safety. Pt sat edge of mat with no support and experienced no LOB. Pt completed functional reaching activity with L UE, with a focus on L weight shifting and in L visual field. Pt required frequent redirection to task and vc for problem solving. Pt demo L UE deficits in stereognosis. Pt returned to room and left sitting up in w/c with belt alarm set and son present.   Therapy Documentation Precautions:  Precautions Precautions: Fall Precaution Comments: right gaze, left hemiparesis, left lean /pushing Restrictions Weight Bearing Restrictions: No   Pain: Pain Assessment Pain Scale: 0-10 Pain Score: 0-No  pain  See Function Navigator for Current Functional Status.   Therapy/Group: Individual Therapy  Curtis Sites 12/12/2017, 12:58 PM

## 2017-12-12 NOTE — Progress Notes (Signed)
Occupational Therapy Session Note  Patient Details  Name: Dakota [name] MRN: [id number] Date of Birth: 1928/11/18  Today's Date: 12/12/2017 OT Individual Time: 0800-0830 OT Individual Time Calculation (min): 30 min    Short Term Goals: Week 1:  OT Short Term Goal 1 (Week 1): Pt will be able to sit to EOB with mod A of 1 in prep for self care. OT Short Term Goal 2 (Week 1): Pt will be able to maintain static sitting EOB with min A in prep for self care. OT Short Term Goal 3 (Week 1): Pt will be able to use LUE to wash abdomen and chest with min cues. OT Short Term Goal 5 (Week 1): Pt will identify items in his left visual field with min cues.   Skilled Therapeutic Interventions/Progress Updates:    Pt resting in bed upon arrival and tossed blanket on floor.  OT intervention with focus on bed mobility, sitting balance EOB, and functional transfers to increase independence with BADLs. Pt incontinent of bladder and required tot A for hygiene and changing brief.  Pt required min A for rolling R/L for LB clothing management and donning pants. Pt required max A for supine>sit EOB and min A for sitting balance EOB in preparation for squat pivot transfer to w/c with mod A.  Pt remained in w/c with belt alarm activated.   Therapy Documentation Precautions:  Precautions Precautions: Fall Precaution Comments: right gaze, left hemiparesis, left lean /pushing Restrictions Weight Bearing Restrictions: No Pain:  Pt denies pain  See Function Navigator for Current Functional Status.   Therapy/Group: Individual Therapy  Leroy Libman 12/12/2017, 9:01 AM

## 2017-12-13 ENCOUNTER — Inpatient Hospital Stay (HOSPITAL_COMMUNITY): Payer: Medicare Other | Admitting: Occupational Therapy

## 2017-12-13 ENCOUNTER — Inpatient Hospital Stay (HOSPITAL_COMMUNITY): Payer: Medicare Other | Admitting: Speech Pathology

## 2017-12-13 ENCOUNTER — Inpatient Hospital Stay (HOSPITAL_COMMUNITY): Payer: Medicare Other | Admitting: *Deleted

## 2017-12-13 ENCOUNTER — Inpatient Hospital Stay (HOSPITAL_COMMUNITY): Payer: Medicare Other | Admitting: Physical Therapy

## 2017-12-13 MED ORDER — TEMAZEPAM 7.5 MG PO CAPS
7.5000 mg | ORAL_CAPSULE | ORAL | Status: DC
  Administered 2017-12-13 – 2017-12-16 (×4): 7.5 mg via ORAL
  Filled 2017-12-13 (×4): qty 1

## 2017-12-13 NOTE — Progress Notes (Addendum)
Occupational Therapy Weekly Progress Note  Patient Details  Name: Dakota [name] MRN: [id number] Date of Birth: January 18, 1929  Beginning of progress report period: December 05, 2017 End of progress report period: December 13, 2017  Today's Date: 12/13/2017 OT Individual Time: 0905-1001 OT Individual Time Calculation (min): 56 min    Patient has met 4 of 5 short term goals.  Pt is making steady progress with OT but still needs overall mod assist for functional transfers stand pivot.  Min guard assist for UB bathing with mod assist for UB dressing.  Max assist for dressing sit to stand.  He continues to demonstrate decreased motor planning and functional use of the LUE for selfcare tasks.  He can use the LUE for washing but frequently drops the washcloth secondary to decreased feeling in the hand, and demonstrates poor visual awareness that this has happened.  In addition, he demonstrates difficulty picking up objects with the left hand when he drops them.  When standing to pull up pants he attempts to use the left hand, but cannot efficiently grasp the pants to pull them up.  He continues to demonstrate flexed posture in standing as well as head turn to the right.  Max instructional cueing to scan left of midline for locating surfaces to transfer to or objects.  Pushing to the left is also still present, however he can maintain static sitting balance with supervision and dynamic sitting balance during LB selfcare.  Feel he is making steady progress with OT but still needs extensive daily CIR to progress to a min assist level.  Will continue with current OT POC.    Patient continues to demonstrate the following deficits: muscle weakness, impaired timing and sequencing, unbalanced muscle activation, motor apraxia, decreased coordination and decreased motor planning, decreased visual perceptual skills and field cut, decreased attention to left and left side neglect and decreased sitting balance, decreased standing  balance, decreased postural control, hemiplegia and decreased balance strategies and therefore will continue to benefit from skilled OT intervention to enhance overall performance with BADL and Reduce care partner burden.  Patient progressing toward long term goals..  Continue plan of care.  OT Short Term Goals Week 2:  OT Short Term Goal 1 (Week 2): Pt completed UB selfcare sitting with min assist.  OT Short Term Goal 2 (Week 2): Pt will complete LB bathing min assist sit to stand.   OT Short Term Goal 3 (Week 2): Pt will complete LB dressing sit to stand with mod assist. OT Short Term Goal 4 (Week 2): Pt will complete toilet transfers and shower transfers stand pivot with min assist.   OT Short Term Goal 5 (Week 2): Pt will locate grooming and bathing items left of midline with no more and min instructional cueing.  Skilled Therapeutic Interventions/Progress Updates:    Pt completed shower and dressing sit to stand.  Mod assist for transfer to the shower tub bench ambulating without assistive device.  Once in the shower he was able to remove clothing with mod assist sit to stand and max instructional cueing to sequence removal of shirt.  He needed max assist for use of hand held shower as well as max instructional cueing to sequence all aspects of bathing.  He perseverated on washing his right leg over and over several times before therapist finally had to intervene.  Mod assist for transfer out of the shower to the wheelchair at the sink for dressing tasks.  He was able to donn his right  arm in the pullover with increased time but needed mod assist for the LUE.  He pulled it over his head and down over his trunk with min assist.  Mod assist for threading the right and left LEs with max demonstrational cueing.  Mod assist for sit to stand to pull them up over her hips.  Therapist assisted with donning TEDs and shoes secondary to decreased time.  Pt left in the wheelchair with pt's son sitting beside of  him and call button in reach.    Therapy Documentation Precautions:  Precautions Precautions: Fall Precaution Comments: right gaze, left hemiparesis, left lean /pushing Restrictions Weight Bearing Restrictions: No    Pain: Pain Assessment Pain Scale: Faces Pain Type: Acute pain Pain Location: Back Pain Descriptors / Indicators: Discomfort Pain Onset: With Activity Pain Intervention(s): Repositioned ADL: See Function Navigator for Current Functional Status.   Therapy/Group: Individual Therapy  Kinta Martis OTR/L 12/13/2017, 12:52 PM

## 2017-12-13 NOTE — Progress Notes (Signed)
Physical Therapy Session Note  Patient Details  Name: Dakota [name] MRN: [id number] Date of Birth: 1928-11-25  Today's Date: 12/13/2017 PT Individual Time:    1500-1600  60 min  Short Term Goals: Week 2:  PT Short Term Goal 1 (Week 2): Patient to perform rolling and sit to supine/supine to sit transfers with ModA PT Short Term Goal 2 (Week 2): Patient to be able to complete stand-pivot transfers with MinA  PT Short Term Goal 3 (Week 2): Patient to be able to ambulate at least 81f with ModA of 1 and LRAD  PT Short Term Goal 4 (Week 2): Family to be educated and able to demonstrate basic bed mobility and sit to stand transfer mechanics with patient and close S from PT staff   Skilled Therapeutic Interventions/Progress Updates:   [facility] Gait training with HHA from PT 2 x 986fwith min-mod assist to maintain LUE raised beyond 90 degrees to faciliate trunk extension. PT instructed pt in stair management x 4 steps with min assist for ascent and min-mod assist intermittently with descent. Gait training also completed with RW x 15096fith min assist for safety and improved attention to the LLE to increase step length and improve weight shifting to the R. Variable gait training: forward/backward with RW [id number] with min assist for forward and mod assist for backward with cues for improved step length Bil. Side stepping at rail in hall 3 x 45ft33f with min-mod assist for weight shifting to the R. Pt returned to room and performed stand pivot transfer to bed with moderate assist. Sit>supine completed with supervision assist, and left supine in bed with call bell in reach and all needs met.       Therapy Documentation Precautions:  Precautions Precautions: Fall Precaution Comments: right gaze, left hemiparesis, left lean /pushing Restrictions Weight Bearing Restrictions: No Vital Signs: Therapy Vitals Temp: 98.1 F (36.7  C) Pulse Rate: 84 Resp: 19 BP: 114/69 Patient Position (if appropriate): Sitting Oxygen Therapy SpO2: 100 % O2 Device: Room Air Pain:   Faces: no pain.   See Function Navigator for Current Functional Status.   Therapy/Group: Individual Therapy  AustiLorie Phenix/2019, 3:22 PM

## 2017-12-13 NOTE — Progress Notes (Signed)
Physical Therapy Session Note  Patient Details  Name: Dakota [name] MRN: [id number] Date of Birth: [birth year]  Today's Date: 12/13/2017 PT Individual Time: 1301-1400 PT Individual Time Calculation (min): 59 min   Short Term Goals: Week 2:  PT Short Term Goal 1 (Week 2): Patient to perform rolling and sit to supine/supine to sit transfers with ModA PT Short Term Goal 2 (Week 2): Patient to be able to complete stand-pivot transfers with MinA  PT Short Term Goal 3 (Week 2): Patient to be able to ambulate at least 64f with ModA of 1 and LRAD  PT Short Term Goal 4 (Week 2): Family to be educated and able to demonstrate basic bed mobility and sit to stand transfer mechanics with patient and close S from PT staff   Skilled Therapeutic Interventions/Progress Updates: Pt presented in w/c with son present agreeable to therapy. Pt noted to be more alert this session compared to previous day. Pt transported to rehab gym and performed stand pivot transfer to mat. Participated in standing balance activities including reaching and placing horse shoes on basketball rim for forced use of LUE. Pt noted to have improved posture with decreased L lean than previous day's session. Pt was able to complete x2 bouts maintaining fair balance and requiring min verbal cues for postural correction. Pt participated in single leg toe taps and alternating toe taps x 10 bilaterally with pt able to clear step and place on 4in step. Pt did require verbal cues for clearing when removing foot from step vs dragging foot off step. Participated in seated PNF diagonals with 1kg weighted ball x 8 ea. [facility] to increase reach and maintain hold on ball. Participated in gait training with RW, ambulated 1037fwith RW minA with verbal cues to stay within RW particularly with turns. Pt then attempted weaving around x 3 cones in which pt was unable to weave around cones despite max multimodal cues as instead pt would walk up to cone  then side step around cone. After seated rest pt ambulated back to room with RW while kicking yoga block with LLE all performed with minA. Pt returned to w/c at end of session and left with current needs met and son present.      Therapy Documentation Precautions:  Precautions Precautions: Fall Precaution Comments: right gaze, left hemiparesis, left lean /pushing Restrictions Weight Bearing Restrictions: No General:   Vital Signs: Therapy Vitals Temp: 98.1 F (36.7 C) Pulse Rate: 84 Resp: 19 BP: 114/69 Patient Position (if appropriate): Sitting Oxygen Therapy SpO2: 100 % O2 Device: Room Air  See Function Navigator for Current Functional Status.   Therapy/Group: Individual Therapy  Jacqualin Shirkey  Cherl Gorney, PTA  12/13/2017, 4:01 PM

## 2017-12-13 NOTE — Progress Notes (Signed)
[facility]IC[facility] RE[facility]ILITATION     PROGRESS NOTE  Subjective/Complaints:  Had a little better night. Denies pain.   ROS: Limited due to cognitive/behavioral    Objective: Vital Signs: Blood pressure 119/74, pulse 96, temperature 98 F (36.7 C), temperature source Oral, resp. rate 18, height 5\' 7"  (1.702 m), weight 76.5 kg, SpO2 95 %. No results found. No results for input(s): WBC, HGB, HCT, PLT in the last 72 hours. No results for input(s): NA, K, CL, GLUCOSE, BUN, CREATININE, CALCIUM in the last 72 hours.  Invalid input(s): CO CBG (last 3)  No results for input(s): GLUCAP in the last 72 hours.  Wt Readings from Last 3 Encounters:  12/12/17 76.5 kg  12/04/17 78.1 kg  11/21/17 81.7 kg    Physical Exam:  BP 119/74 (BP Location: Right Arm)   Pulse 96   Temp 98 F (36.7 C) (Oral)   Resp 18   Ht 5\' 7"  (1.702 m)   Wt 76.5 kg   SpO2 95%   BMI 26.41 kg/m  Constitutional: No distress . Vital signs reviewed. HEENT: EOMI, oral membranes moist Neck: supple Cardiovascular: RRR without murmur. No JVD    Respiratory: CTA Bilaterally without wheezes or rales. Normal effort    GI: BS +, non-tender, non-distended  Musculoskeletal: No edema or tenderness in extremities  Neurological: He is alert, HOH Dysarthria Oriented 2 Motor: Right upper extremity: 5/5 proximal distal Left upper extremity: 4+/5 proximal to distal Bilateral lower extremities: Hip flexion 3+/5, knee extension 3/5, ankle dorsiflexion 4+/5 (essentially stable) Left inattention present Skin: Skin is warm and dry.  Psychiatric: slow to process.   Assessment/Plan: 1. Functional deficits secondary to right infarct with GI bleed which require 3+ hours per day of interdisciplinary therapy in a comprehensive inpatient rehab setting. Physiatrist is providing close team supervision and 24 hour management of active medical problems listed below. Physiatrist and rehab team continue to assess barriers to  discharge/monitor patient progress toward functional and medical goals.  Function:  Bathing Bathing position   Position: Shower  Bathing parts Body parts bathed by patient: Right arm, Left arm, Chest, Abdomen, Right upper leg, Left upper leg, Left lower leg, Right lower leg Body parts bathed by helper: Back, Front perineal area, Buttocks  Bathing assist Assist Level: 2 helpers      Upper Body Dressing/Undressing Upper body dressing   What is the patient wearing?: Pull over shirt/dress     Pull over shirt/dress - Perfomed by patient: Thread/unthread right sleeve, Put head through opening Pull over shirt/dress - Perfomed by helper: Thread/unthread left sleeve, Pull shirt over trunk        Upper body assist Assist Level: Touching or steadying assistance(Pt > 75%)      Lower Body Dressing/Undressing Lower body dressing   What is the patient wearing?: Pants, Ted Hose, Shoes     Pants- Performed by patient: Thread/unthread right pants leg Pants- Performed by helper: Thread/unthread left pants leg, Pull pants up/down   Non-skid slipper socks- Performed by helper: Don/doff right sock, Don/doff left sock               TED Hose - Performed by helper: Don/doff right TED hose, Don/doff left TED hose  Lower body assist Assist for lower body dressing: 2 Helpers      Toileting Toileting Toileting activity did not occur: No continent bowel/bladder event Toileting steps completed by patient: Adjust clothing prior to toileting Toileting steps completed by helper: Adjust clothing prior to toileting,  Performs perineal hygiene, Adjust clothing after toileting Toileting Assistive Devices: Grab bar or rail  Toileting assist Assist level: Two helpers   Transfers Chair/bed transfer   Chair/bed transfer method: Stand pivot Chair/bed transfer assist level: Moderate assist (Pt 50 - 74%/lift or lower) Chair/bed transfer assistive device: Armrests, Walker Mechanical lift: Press photographer     Max distance: 150 Assist level: 2 helpers   Wheelchair   Type: Manual Max wheelchair distance: 70ft Assist Level: Moderate assistance (Pt 50 - 74%)  Cognition Comprehension Comprehension assist level: Understands basic 25 - 49% of the time/ requires cueing 50 - 75% of the time  Expression Expression assist level: Expresses basic 25 - 49% of the time/requires cueing 50 - 75% of the time. Uses single words/gestures.  Social Interaction Social Interaction assist level: Interacts appropriately 25 - 49% of time - Needs frequent redirection.  Problem Solving Problem solving assist level: Solves basic 25 - 49% of the time - needs direction more than half the time to initiate, plan or complete simple activities  Memory Memory assist level: Recognizes or recalls 25 - 49% of the time/requires cueing 50 - 75% of the time    Medical Problem List and Plan: 1.  Right lean, left-sided weakness,dysphagia, cognitive deficits and difficulty standing secondary to right MCA infarct with GI bleed.  Continue CIR 2.  DVT Prophylaxis/Anticoagulation: Mechanical:  Antiembolism stockings, knee (TED hose) Bilateral lower extremities 3. Pain Management: tylenol prn 4. Mood: LCSW to follow for evaluation and support as needed.  5. Neuropsych: This patient is not capable of making decisions on his own behalf. 6. Skin/Wound Care: routine pressure relief measures.  7. Fluids/Electrolytes/Nutrition: Monitor I/O.   BMP within acceptable range on 8/9  -intake fair to avg  -recheck BMET tomorrow 8. ABLA due to duodenal ulcer/GIB:   Monitor H/H serial checks.   Increased protonix to 40 mg bid.  Hemoglobin 10.4 on 8/9---recheck cbc tomorrow 9.  Chronic A fib: Monitor HR bid. No anticoagulation for now. No medications due to hypotension.  10. Delirium/sundowning: Continue Depakote tid      adjustments as needed  Seroquel DC'd on 8/8 due to oversedation, trazodone ineffective, restoril  started on 8/9---overal sleep patterns improved 11. Constipation  Increased senna to bid.  Had bm 8/11   12. Prediabetes  Reasonable control 13. Hypoalbuminemia  Supplement initiated on 8/6 14. Blood pressure  -reasonable control  LOS (Days) 9 A FACE TO FACE EVALUATION WAS PERFORMED  Meredith Staggers 12/13/2017 12:48 PM

## 2017-12-13 NOTE — Progress Notes (Signed)
Nutrition Follow-up  DOCUMENTATION CODES:   Not applicable  INTERVENTION:  Continue Ensure Enlive po TID, each supplement provides 350 kcal and 20 grams of protein.  May use Vital Cuisine shakes as an Ensure substitute.  Continue 30 ml Prostat po BID, each supplement provides 100 kcal and 15 grams of protein.   Encourage adequate PO intake.   NUTRITION DIAGNOSIS:   Inadequate oral intake related to dysphagia as evidenced by per patient/family report(varied meal completion); improving  GOAL:   Patient will meet greater than or equal to 90% of their needs; met  MONITOR:   PO intake, Supplement acceptance, Diet advancement, Weight trends, Labs, Skin, I & O's  REASON FOR ASSESSMENT:   Consult Calorie Count  ASSESSMENT:   84 year old male with history of hypertension, complete heart block status post permanent pacemaker, GERD/Schatzki's ring, AF no Coumadin who was admitted on zero 7/18 10/2017 with left-sided weakness, right gaze preference, slurred speech and mental status changes due to right MCA territory infarct. On 11/24/2017 he developed maroon stools due to GI bleed and was transfused to acute services for closer monitoring. Pt now readmitted to CIR.   Meal completion has been varied from 25-60% with most intake at 50-60%. Intake has been improving. Pt currently has Ensure and Prostat ordered and has been consuming them. RD to continue with current orders to aid in caloric and protein needs.   Diet Order:   Diet Order            DIET - DYS 1 Room service appropriate? Yes; Fluid consistency: Nectar Thick  Diet effective now              EDUCATION NEEDS:   Not appropriate for education at this time  Skin:  Skin Assessment: Reviewed RN Assessment  Last BM:  8/13  Height:   Ht Readings from Last 1 Encounters:  12/05/17 '5\' 7"'  (1.702 m)    Weight:   Wt Readings from Last 1 Encounters:  12/12/17 76.5 kg    Ideal Body Weight:  67.27 kg  BMI:  Body mass  index is 26.41 kg/m.  Estimated Nutritional Needs:   Kcal:  1700-1900  Protein:  75-85 grams  Fluid:  >/= 1.7 L/day    Dakota Parker, MS, RD, LDN Pager # [phone] After hours/ weekend pager # [phone]

## 2017-12-13 NOTE — Progress Notes (Signed)
Speech Language Pathology Daily Session Note  Patient Details  Name: Dakota [name] MRN: [id number] Date of Birth: [birth year]  Today's Date: 12/13/2017 SLP Individual Time: 1030-1055 SLP Individual Time Calculation (min): 25 min  Short Term Goals: Week 2: SLP Short Term Goal 1 (Week 2): Pt will consume dys 1 textures and nectar thick liquids via cup with mod assist multimodal cues for use of compensatory swallow strategies (small bite, slow rate).  SLP Short Term Goal 2 (Week 2): Pt will complete basic familiar problem solving tasks related to ADLs with Mod A cues.  SLP Short Term Goal 3 (Week 2): Pt will sustain attention to basic, familiar tasks for ~10 minute intervals with mod assist multimodal cues.  SLP Short Term Goal 4 (Week 2): Pt will slow rate and increase vocal intensity to achieve ~50%  intelligibility at the phrase level with mod assist.   SLP Short Term Goal 5 (Week 2): Pt will locate items to the left of midline during basic familiar tasks in ~50% of opportunities with max assist multimodal cues.  SLP Short Term Goal 6 (Week 2): Pt will consume thin liquids via TSP with min overt s/s of aspiration and Mod A cues for use of compensatory swallow strategies.   Skilled Therapeutic Interventions: Skilled treatment session focused on dysphagia goals. SLP facilitated session by providing skilled observation with snack of Dys. 1 textures with trials of thin liquids. Patient demonstrated decreased oral residue today with applesauce but continues to demonstrate intermittent overt s/s of aspiration, suspect due to intermittent large bolus size and suspected delayed swallow initiation due to fatigue. Recommend patient continue current diet with trials of thin with SLP. Patient left upright in wheelchair with family present. Continue with current plan of care.      Function:   Cognition Comprehension Comprehension assist level: Understands basic 25 - 49% of the time/ requires cueing 50 -  75% of the time  Expression   Expression assist level: Expresses basic 25 - 49% of the time/requires cueing 50 - 75% of the time. Uses single words/gestures.  Social Interaction Social Interaction assist level: Interacts appropriately 25 - 49% of time - Needs frequent redirection.  Problem Solving Problem solving assist level: Solves basic 25 - 49% of the time - needs direction more than half the time to initiate, plan or complete simple activities  Memory Memory assist level: Recognizes or recalls 25 - 49% of the time/requires cueing 50 - 75% of the time    Pain No/Denies Pain   Therapy/Group: Individual Therapy  Eban Weick 12/13/2017, 11:12 AM

## 2017-12-14 ENCOUNTER — Inpatient Hospital Stay (HOSPITAL_COMMUNITY): Payer: Medicare Other | Admitting: Occupational Therapy

## 2017-12-14 ENCOUNTER — Inpatient Hospital Stay (HOSPITAL_COMMUNITY): Payer: Medicare Other | Admitting: Physical Therapy

## 2017-12-14 ENCOUNTER — Ambulatory Visit (HOSPITAL_COMMUNITY): Payer: Medicare Other | Admitting: Speech Pathology

## 2017-12-14 LAB — BASIC METABOLIC PANEL
Anion gap: 6 (ref 5–15)
BUN: 23 mg/dL (ref 8–23)
CO2: 32 mmol/L (ref 22–32)
Calcium: 9 mg/dL (ref 8.9–10.3)
Chloride: 100 mmol/L (ref 98–111)
Creatinine, Ser: 1.01 mg/dL (ref 0.61–1.24)
GFR calc non Af Amer: >60 mL/min (ref >60)
Glucose, Bld: 103 mg/dL — ABNORMAL HIGH (ref 70–99)
Potassium: 4.3 mmol/L (ref 3.5–5.1)
SODIUM: 138 mmol/L (ref 135–145)

## 2017-12-14 LAB — CBC
HCT: 36.5 % — ABNORMAL LOW (ref 39.0–52.0)
Hemoglobin: 11.5 g/dL — ABNORMAL LOW (ref 13.0–17.0)
MCH: 29.6 pg (ref 26.0–34.0)
MCHC: 31.5 g/dL (ref 30.0–36.0)
MCV: 93.8 fL (ref 78.0–100.0)
Platelets: 286 10*3/uL (ref 150–400)
RBC: 3.89 MIL/uL — AB (ref 4.22–5.81)
RDW: 14.2 % (ref 11.5–15.5)
WBC: 7.8 10*3/uL (ref 4.0–10.5)

## 2017-12-14 LAB — VALPROIC ACID LEVEL: VALPROIC ACID LVL: 40 ug/mL — AB (ref 50.0–100.0)

## 2017-12-14 NOTE — Progress Notes (Signed)
Social Work Patient ID: Dakota [name], male   DOB: 10/21/1928, 84 y.o.   MRN: [id number]    Rexene Alberts  Social Worker  General Practice  Patient Care Conference  Signed  Date of Service:  12/14/2017 12:09 PM          Signed        Show:Clear all '[x]' Manual'[x]' Template'[]' Copied  Added by: '[x]' Lowella Curb, LCSW  '[]' Hover for details Inpatient RehabilitationTeam Conference and Plan of Care Update Date: 12/13/2017   Time: 11:35 AM      Patient Name: Dakota [name]      Medical Record Number: [id number]  Date of Birth: 10-Jan-1929 Sex: Male         Room/Bed: 4M11C/4M11C-01 Payor Info: Payor: MEDICARE / Plan: MEDICARE PART A AND B / Product Type: *No Product type* /     Admitting Diagnosis: R MCA  Admit Date/Time:  12/04/2017  3:53 PM Admission Comments: No comment available    Primary Diagnosis:  <principal problem not specified> Principal Problem: <principal problem not specified>       Patient Active Problem List    Diagnosis Date Noted  . Sundowning    . Labile blood pressure    . Slow transit constipation    . History of GI bleed    . Acute blood loss anemia    . Delirium    . GIB (gastrointestinal bleeding) 11/24/2017  . Hypotension 11/24/2017  . Hypovolemic shock ([place]) 11/24/2017  . Acute lower UTI    . Leucocytosis    . Hypoalbuminemia due to protein-calorie malnutrition ([place])    . Prediabetes    . Prerenal azotemia    . Subacute delirium 11/21/2017  . Dysphagia due to recent cerebrovascular accident 11/21/2017  . Chewing tobacco nicotine dependence 11/21/2017  . Leukocytosis 11/21/2017  . Acute ischemic right MCA stroke ([place]) 11/21/2017  . Cerebrovascular accident (CVA) due to thrombosis of right middle cerebral artery ([place])    . Acute deep vein thrombosis (DVT) of proximal vein of lower extremity ([name])    . AKI (acute kidney injury) ([place])    . Middle cerebral artery embolism, right s/p endovascular therapy 11/16/2017  . Caregiver stress  10/05/2015  . Hematuria 04/06/2015  . ARF (acute renal failure) ([place]) 07/18/2014  . Cardiogenic shock ([place]) 07/18/2014  . Atrial fibrillation with slow ventricular response ([place])    . NSTEMI (non-ST elevated myocardial infarction) ([place])    . CHB (complete heart block) - s/p MDT 1 lead PPM 07/17/2014  . Advance care planning 07/03/2014  . Sebaceous cyst 04/11/2013  . Medicare annual wellness visit, subsequent 10/28/2012  . RIB PAIN, RIGHT SIDED 01/02/2009  . CAD, NATIVE VESSEL 11/25/2008  . Unspecified vitamin D deficiency 11/24/2008  . RECENT RET DETACH PARTIAL W/SINGLE DEFECT 11/24/2008  . Essential hypertension, benign 07/21/2008  . HLD (hyperlipidemia) 05/22/2007  . Atrial fibrillation ([place]) 05/22/2007  . GERD 05/22/2007  . GASTRIC ULCER, ACUTE, HEMORRHAGE 05/22/2007  . Hyperglycemia 05/22/2007      Expected Discharge Date: Expected Discharge Date: 12/26/17   Team Members Present: Physician leading conference: Dr. Alysia Penna Social Worker Present: Lennart Pall, LCSW Nurse Present: Isla Pence, RN PT Present: Barrie Folk, PT;Rosita Dechalus, PTA OT Present: Clyda Greener, OT PPS Coordinator present : Daiva Nakayama, RN, CRRN       Current Status/Progress Goal Weekly Team Focus  Medical     improving sleep and wakefulness, behaviorally better with increased cooperation, hgb/labs stable  improve participation with staft  nutrition, sleep, mgt of bp, behavioral considerations   Bowel/Bladder     Contient/incontinent of bowel/bladder; LBM 12/12/17  To be continent of boel/bladder with min assist  timed toileting   Swallow/Nutrition/ Hydration     Dys. 1 textures with thin liquids, Max A  Min A  trials of thin liquids with Dys. 1 textures, increased use of strategies    ADL's     Min assist for UB bathing and mod assist for LB selfcare, mod assist for toilet and shower transfers.  Left neglect still present, decreased LUE coordination and sensation  min assist overall   neuromuscular re-education, transfer training, selfcare retraining, balance retraining, pt/family education, vision compensation   Mobility     modA bed mobilty, modA minA if awake/alert transfers, modA gait with RW, continues to demonstrate poor awareness/inattention to L, modA w/c mobility  min assist overall  L NMR, transfer, balance, gait, cognitive remediation   Communication     communication is impacted by cognition, Max A   mod assist   use of increased vocal intensity    Safety/Cognition/ Behavioral Observations   Max A  min assist   attention, initiation, problem solving    Pain     no complain of pain  <2  assess and treat pain q shift and as needed   Skin     scattered bruising  no skin breakdown/infection with min assist  assess skin q shift and as needed     Rehab Goals Patient on target to meet rehab goals: Yes *See Care Plan and progress notes for long and short-term goals.      Barriers to Discharge   Current Status/Progress Possible Resolutions Date Resolved   Physician     Medical stability        ongoing medical mgt per progress notes      Nursing                 PT                    OT                 SLP Nutrition means Continues to be at high aspiration with thin liquids           SW              Discharge Planning/Teaching Needs:  Pt's sons have stated they will care for pt at home at d/c.    Family ed is ongoing with son who is here daily.   Team Discussion:  ?more fatigued today;  Mostly incont of urine.  Min-mod assist with self care and transfers.  Still pushing to the left with occ LOB.  Apraxic; visual-perceptual deficits and left neglect.  ST addressing swallow.  Slow progress this week.  Goals still min assist overall.  Revisions to Treatment Plan:  None    Continued Need for Acute Rehabilitation Level of Care: The patient requires daily medical management by a physician with specialized training in physical medicine and rehabilitation for  the following conditions: Daily direction of a multidisciplinary physical rehabilitation program to ensure safe treatment while eliciting the highest outcome that is of practical value to the patient.: Yes Daily medical management of patient stability for increased activity during participation in an intensive rehabilitation regime.: Yes Daily analysis of laboratory values and/or radiology reports with any subsequent need for medication adjustment of medical intervention for : Neurological problems;Blood pressure problems;Nutritional problems;Mood/behavior problems  Lennart Pall 12/14/2017, 12:09 PM                Lowella Curb, LCSW  Social Worker  General Practice  Progress Notes  Signed  Date of Service:  11/23/2017  3:05 PM       Related encounter: Admission (Discharged) from 11/21/2017 in [facility]      Signed          Show:Clear all '[x]' Manual'[x]' Template'[]' Copied  Added by: '[x]' Lowella Curb, LCSW   '[]' Hover for details   Social Work Assessment and Plan Social Work Assessment and Plan   Patient Details  Name: ELENA COTHERN MRN: [id number] Date of Birth: 1928/08/26   Today's Date: 11/23/2017   Problem List:      Patient Active Problem List    Diagnosis Date Noted  . Acute lower UTI    . Leucocytosis    . Hypoalbuminemia due to protein-calorie malnutrition ([place])    . Prediabetes    . Prerenal azotemia    . Acute delirium 11/21/2017  . Dysphagia due to recent cerebrovascular accident 11/21/2017  . Chewing tobacco nicotine dependence 11/21/2017  . Leukocytosis 11/21/2017  . Acute ischemic right MCA stroke ([place]) 11/21/2017  . Cerebrovascular accident (CVA) due to thrombosis of right middle cerebral artery ([place])    . Acute deep vein thrombosis (DVT) of proximal vein of lower extremity ([name])    . AKI (acute kidney injury) ([place])    . Middle cerebral artery embolism, right s/p endovascular therapy 11/16/2017  . Caregiver  stress 10/05/2015  . Hematuria 04/06/2015  . ARF (acute renal failure) ([place]) 07/18/2014  . Cardiogenic shock ([place]) 07/18/2014  . Atrial fibrillation with slow ventricular response ([place])    . NSTEMI (non-ST elevated myocardial infarction) ([place])    . CHB (complete heart block) - s/p MDT 1 lead PPM 07/17/2014  . Advance care planning 07/03/2014  . Sebaceous cyst 04/11/2013  . Medicare annual wellness visit, subsequent 10/28/2012  . RIB PAIN, RIGHT SIDED 01/02/2009  . CAD, NATIVE VESSEL 11/25/2008  . Unspecified vitamin D deficiency 11/24/2008  . RECENT RET DETACH PARTIAL W/SINGLE DEFECT 11/24/2008  . Essential hypertension, benign 07/21/2008  . HLD (hyperlipidemia) 05/22/2007  . Atrial fibrillation ([place]) 05/22/2007  . GERD 05/22/2007  . GASTRIC ULCER, ACUTE, HEMORRHAGE 05/22/2007  . Hyperglycemia 05/22/2007    Past Medical History:      Past Medical History:  Diagnosis Date  . Anxiety    . Back pain, chronic    . Complete heart block ([place]) 07/18/2014    Medtronic [place] model [id number] (serial number [id number])  singe lead PPM  . Esophageal obstruction due to food impaction 2017  . GERD (gastroesophageal reflux disease) 10/2002  . Hyperlipidemia 12/1994  . Hypertension    . Hypokalemia    . Lung collapse 03/1991    Fall  . Permanent atrial fibrillation ([name])      Refused coumadin therapy    Past Surgical History:       Past Surgical History:  Procedure Laterality Date  . BACK SURGERY   11/91    with hardware fixation  . CARDIAC CATHETERIZATION   06/2004    30 % stenosis, EF normal  . ESOPHAGOGASTRODUODENOSCOPY N/A 01/06/2016    Procedure: ESOPHAGOGASTRODUODENOSCOPY (EGD);  Surgeon: Otis Brace, MD;  Location: [facility];  Service: Gastroenterology;  Laterality: N/A;  . EYE SURGERY        Retinal bubble surgery, cataract   . IR CT HEAD  LTD   11/16/2017  . IR PERCUTANEOUS ART THROMBECTOMY/INFUSION INTRACRANIAL INC DIAG ANGIO   11/16/2017  . LEFT HEART CATHETERIZATION  WITH CORONARY ANGIOGRAM N/A 07/18/2014    Procedure: LEFT HEART CATHETERIZATION WITH CORONARY ANGIOGRAM;  Surgeon: Jettie Booze, MD; [facility] [place], LAD mild dz, D1 80%, D2 OK, CFX system OK, RCA OK, PDA 100%, med rx  . PERMANENT PACEMAKER INSERTION N/A 07/18/2014    Procedure: PERMANENT PACEMAKER INSERTION;  Surgeon: Thompson Grayer, MD; Medtronic [place] model [phone] (serial number [id number])    . RADIOLOGY WITH ANESTHESIA N/A 11/16/2017    Procedure: RADIOLOGY WITH ANESTHESIA;  Surgeon: Luanne Bras, MD;  Location: [facility];  Service: Radiology;  Laterality: N/A;  . TEMPORARY PACEMAKER INSERTION N/A 07/18/2014    Procedure: TEMPORARY PACEMAKER INSERTION;  Surgeon: Peter M Martinique, MD;  Location: [facility] C[facility];  Service: Cardiovascular;  Laterality: N/A;    Social History:  reports that he quit smoking about 64 years ago. He has a 15.00 pack-year smoking history. His smokeless tobacco use includes chew. He reports that he does not drink alcohol or use drugs.   Family / Support Systems Marital Status: Married Patient Roles: Spouse, Caregiver, Parent Spouse/Significant Other: wife requires 24/7 care from family Children: daughter, [name] @ (H) [phone];  son, Pawan Knechtel @ (C) [phone];  son, Abe People @ (C) [phone] and son, Astor Gentle"  Anticipated Caregiver: Family currently sharing responsibilities for care of their mother and plan to continue this for pt, too, upon d/c. Ability/Limitations of Caregiver: Dailey retired;  other 3 children are working full-time and one with new job and less daytime availability.   Caregiver Availability: 24/7 Family Dynamics: Son, Quintavius, notes that all children (and grandchildren) are very supportive and are sharing in care of pt and his wife.  They are trying to keep their parents in their home for as long as possible.  They have hired a Academic librarian for the [employer].   Social History Preferred language: English Religion: Baptist Cultural  Background: NA Read: Yes Write: Yes Employment Status: Retired Freight forwarder Issues: None Guardian/Conservator: None - per MD, pt is not capable of making decisions on his own behalf - defer to children.  Son, Majour, notes that no one has POA of any sort.    Abuse/Neglect Abuse/Neglect Assessment Can Be Completed: Yes Physical Abuse: Denies Verbal Abuse: Denies Sexual Abuse: Denies Exploitation of patient/patient's resources: Denies Self-Neglect: Denies   Emotional Status Pt's affect, behavior adn adjustment status: Pt minimally engaged during interview as son, Tresean, provides majority of information.  Pt does smile and attempt to answer basic questions.  He is A&Ox 4 with me.  He appears relaxed and fatigued.  Denies any significant emotional distress, however, will monitor and refer for neuropsychology as indicated. Recent Psychosocial Issues: Pt has been providing 24/7 care for wife for 10+ yrs per son. Pyschiatric History: None Substance Abuse History: None   Patient / Family Perceptions, Expectations & Goals Pt/Family understanding of illness & functional limitations: Pt aware he has suffered a CVA and is receiving rehab for deficits.  Son and family with good understanding of medical issues, current functional limitations/ need for CIR. Premorbid pt/family roles/activities: As noted,  pt was providing 24/7 physical assistance/ caregiving for his wife PTA.  Pt was completely independent and still driving, tending to gardens and riding tractor over his land. Anticipated changes in roles/activities/participation: Team anticipating need for 24/7 care, therefore, family will need to add in pt's care to what they are  already providing for their mother. Pt/family expectations/goals: "He needs to get a lot better than this."  per son, Kabeer, regarding readiness for d/c home.   Community Resources Express Scripts: None Premorbid Home Care/DME Agencies:  None Transportation available at discharge: yes Resource referrals recommended: Neuropsychology   Discharge Planning Living Arrangements: Spouse/significant other Support Systems: Children, Other relatives Type of Residence: Private residence Insurance Resources: Multimedia programmer (specify), Chartered certified accountant Resources: Radio broadcast assistant Screen Referred: No Living Expenses: Own Money Management: Patient, Family Does the patient have any problems obtaining your medications?: No Home Management: patient with assist of family prn Patient/Family Preliminary Plans: Pt and family goal is for pt to be able to reach at least a min assistance level and hopeful they can manage care for the two of them. Social Work Anticipated Follow Up Needs: HH/OP Expected length of stay: 21-24 days   Clinical Impression Elderly gentleman here following CVA and son at bedside to assist with assessment interview.  Pt does attempt to answer basic, personal info questions and does not appear to be in any emotional distress.  Pt is oriented x 4 with me.  Son notes that family very eager to see pt reach a min assist level or better as they will now be providing care to both pt and their mother (who also requires 24/7 assistance.)  SW to follow for support and d/c planning needs.   Rithvik Orcutt 11/23/2017, 3:06 PM

## 2017-12-14 NOTE — Progress Notes (Signed)
Speech Language Pathology Daily Session Note  Patient Details  Name: Dakota [name] MRN: [id number] Date of Birth: 06/11/1928  Today's Date: 12/14/2017 SLP Individual Time: 2035-5974 SLP Individual Time Calculation (min): 60 min  Short Term Goals: Week 2: SLP Short Term Goal 1 (Week 2): Pt will consume dys 1 textures and nectar thick liquids via cup with mod assist multimodal cues for use of compensatory swallow strategies (small bite, slow rate).  SLP Short Term Goal 2 (Week 2): Pt will complete basic familiar problem solving tasks related to ADLs with Mod A cues.  SLP Short Term Goal 3 (Week 2): Pt will sustain attention to basic, familiar tasks for ~10 minute intervals with mod assist multimodal cues.  SLP Short Term Goal 4 (Week 2): Pt will slow rate and increase vocal intensity to achieve ~50%  intelligibility at the phrase level with mod assist.   SLP Short Term Goal 5 (Week 2): Pt will locate items to the left of midline during basic familiar tasks in ~50% of opportunities with max assist multimodal cues.  SLP Short Term Goal 6 (Week 2): Pt will consume thin liquids via TSP with min overt s/s of aspiration and Mod A cues for use of compensatory swallow strategies.   Skilled Therapeutic Interventions: Skilled treatment session focused on dysphagia and cognition goals with further education provided to pt's son. SLP facilitated session by providing skilled observation of pt consuming thin water via cup sips. Pt requires Max A verbal cues for small sips with each sip. Out of 3 initial sips, pt with cough x1. Lunch tray arrived with skilled observation provided of pt consuming pureed items with thin water via cup. Pt with prolonged oral phase with bites of puree and moderate oral residue on left after swallow initiated. Despite max A multimodal cues, pt was not able to decrease oral residue or decreased oral phase with puree. Pt consumed thin liquids via cup sips with in ability to consume small  sips despite Max A multi-modal cues. Pt with immediate coughing throughout consumption and thin liquids discontinued and replaced with nectar thick liquids via cup. Education provided to pt's son on pt's ability to hold small amount of liquid within pyriform sinuses but with larger sips the liquid spills over into airway. Despite education and Max A cues pt is not able to consume smaller sips within functional context. Prognosis for liquid progress is guarded given mentation, sensory component to delayed swallow initiation and time post on-set. Son is signed off to provide supervision when consuming puree with nectar thick liquids.      Function:  Eating Eating   Modified Consistency Diet: Yes Eating Assist Level: More than reasonable amount of time;Supervision or verbal cues;Helper checks for pocketed food   Eating Set Up Assist For: Opening containers Helper Scoops Food on Utensil: Occasionally Helper Brings Food to Mouth: Occasionally   Cognition Comprehension Comprehension assist level: Understands basic 50 - 74% of the time/ requires cueing 25 - 49% of the time  Expression   Expression assist level: Expresses basic 25 - 49% of the time/requires cueing 50 - 75% of the time. Uses single words/gestures.  Social Interaction Social Interaction assist level: Interacts appropriately 25 - 49% of time - Needs frequent redirection.  Problem Solving Problem solving assist level: Solves basic 25 - 49% of the time - needs direction more than half the time to initiate, plan or complete simple activities  Memory Memory assist level: Recognizes or recalls 25 - 49% of the time/requires  cueing 50 - 75% of the time    Pain Pain Assessment Pain Scale: Faces Faces Pain Scale: Hurts a little bit Pain Type: Chronic pain Pain Location: Back Pain Orientation: Lower Pain Descriptors / Indicators: Discomfort Pain Onset: With Activity Pain Intervention(s): Repositioned  Therapy/Group: Individual  Therapy  Leshia Kope 12/14/2017, 2:07 PM

## 2017-12-14 NOTE — Plan of Care (Signed)
  Problem: Consults Goal: RH STROKE PATIENT EDUCATION Description See Patient Education module for education specifics  Outcome: Progressing Goal: Nutrition Consult-if indicated Outcome: Progressing   Problem: RH BOWEL ELIMINATION Goal: RH STG MANAGE BOWEL WITH ASSISTANCE Description STG Manage Bowel with mod  Assistance.   Outcome: Progressing Flowsheets (Taken 12/14/2017 1606) STG: Pt will manage bowels with assistance: 3-Moderate assistance Goal: RH STG MANAGE BOWEL W/MEDICATION W/ASSISTANCE Description STG Manage Bowel with Medication with  [place].  Outcome: Progressing Flowsheets (Taken 12/14/2017 1606) STG: Pt will manage bowels with medication with assistance: 3-Moderate assistance   Problem: RH BLADDER ELIMINATION Goal: RH STG MANAGE BLADDER WITH ASSISTANCE Description STG Manage Bladder With  Mod Assistance   Outcome: Progressing Flowsheets (Taken 12/14/2017 1606) STG: Pt will manage bladder with assistance: 2-Maximum assistance   Problem: RH SKIN INTEGRITY Goal: RH STG SKIN FREE OF INFECTION/BREAKDOWN Description Patient and family will be able to verbalize understanding of preventing breakdown   Outcome: Progressing Goal: RH STG MAINTAIN SKIN INTEGRITY WITH ASSISTANCE Description STG Maintain Skin Integrity With  [place].  Outcome: Progressing Flowsheets (Taken 12/14/2017 1606) STG: Maintain skin integrity with assistance: 4-Minimal assistance   Problem: RH SAFETY Goal: RH STG ADHERE TO SAFETY PRECAUTIONS W/ASSISTANCE/DEVICE Description STG Adhere to Safety Precautions With  Mod Assistance/Device.   Outcome: Progressing Flowsheets (Taken 12/14/2017 1606) STG:Pt will adhere to safety precautions with assistance/device: 3-Moderate assistance Goal: RH STG DECREASED RISK OF FALL WITH ASSISTANCE Description STG Decreased Risk of Fall With  Mod Assistance.   Outcome: Progressing Flowsheets (Taken 12/14/2017 1606) BJS:[id number] risk of fall  with  assistance/device: 3-Moderate assistance   Problem: RH COGNITION-NURSING Goal: RH STG USES MEMORY AIDS/STRATEGIES W/ASSIST TO PROBLEM SOLVE Description STG Uses Memory Aids/Strategies With mod  Assistance to Problem Solve.   Outcome: Progressing Flowsheets (Taken 12/14/2017 1606) STG: Uses memory aids/strategies with assistance: 3-Moderate assistance   Problem: RH PAIN MANAGEMENT Goal: RH STG PAIN MANAGED AT OR BELOW PT'S PAIN GOAL Description Less than 3   Outcome: Progressing   Problem: RH KNOWLEDGE DEFICIT Goal: RH STG INCREASE KNOWLEDGE OF DYSPHAGIA/FLUID INTAKE Description Patient and family will verbalize understanding of prevention of aspiration prior to discharge.  Outcome: Progressing

## 2017-12-14 NOTE — Progress Notes (Signed)
[place] PHYSICAL MEDICINE & REHABILITATION     PROGRESS NOTE  Subjective/Complaints:   Didn't sleep well.  Hard time waking up this morning  ROS: Patient denies fever, rash, sore throat, blurred vision, nausea, vomiting, diarrhea, cough, shortness of breath or chest pain, joint or back pain, headache, or mood change.    Objective: Vital Signs: Blood pressure 128/76, pulse 97, temperature 98.2 F (36.8 C), temperature source Oral, resp. rate 18, height 5\' 7"  (1.702 m), weight 76.5 kg, SpO2 100 %. No results found. Recent Labs    12/14/17 0459  WBC 7.8  HGB 11.5*  HCT 36.5*  PLT 286   Recent Labs    12/14/17 0459  NA 138  K 4.3  CL 100  GLUCOSE 103*  BUN 23  CREATININE 1.01  CALCIUM 9.0   CBG (last 3)  No results for input(s): GLUCAP in the last 72 hours.  Wt Readings from Last 3 Encounters:  12/12/17 76.5 kg  12/04/17 78.1 kg  11/21/17 81.7 kg    Physical Exam:  BP 128/76 (BP Location: Right Arm)   Pulse 97   Temp 98.2 F (36.8 C) (Oral)   Resp 18   Ht 5\' 7"  (1.702 m)   Wt 76.5 kg   SpO2 100%   BMI 26.41 kg/m  Constitutional: No distress . Vital signs reviewed. HEENT: EOMI, oral membranes moist Neck: supple Cardiovascular: RRR without murmur. No JVD    Respiratory: CTA Bilaterally without wheezes or rales. Normal effort    GI: BS +, non-tender, non-distended  Musculoskeletal: No edema or tenderness in extremities  Neurological: HOH, slow to arouse Dysarthria Oriented 2 Motor: Right upper extremity: 5/5 proximal distal Left upper extremity: 4+/5 proximal to distal Bilateral lower extremities: Hip flexion 3+/5, knee extension 3/5, ankle dorsiflexion 4+/5 --no change Left inattention present Skin: Skin is warm and dry.  Psychiatric: lethargic   Assessment/Plan: 1. Functional deficits secondary to right infarct with GI bleed which require 3+ hours per day of interdisciplinary therapy in a comprehensive inpatient rehab setting. Physiatrist is  providing close team supervision and 24 hour management of active medical problems listed below. Physiatrist and rehab team continue to assess barriers to discharge/monitor patient progress toward functional and medical goals.  Function:  Bathing Bathing position   Position: Shower  Bathing parts Body parts bathed by patient: Right arm, Left arm, Chest, Abdomen, Right upper leg, Left upper leg, Left lower leg, Right lower leg Body parts bathed by helper: Back, Front perineal area, Buttocks  Bathing assist Assist Level: 2 helpers      Upper Body Dressing/Undressing Upper body dressing   What is the patient wearing?: Pull over shirt/dress     Pull over shirt/dress - Perfomed by patient: Thread/unthread right sleeve, Put head through opening Pull over shirt/dress - Perfomed by helper: Thread/unthread left sleeve, Pull shirt over trunk        Upper body assist Assist Level: Touching or steadying assistance(Pt > 75%)      Lower Body Dressing/Undressing Lower body dressing   What is the patient wearing?: Pants, Ted Hose, Shoes     Pants- Performed by patient: Thread/unthread right pants leg Pants- Performed by helper: Thread/unthread left pants leg, Pull pants up/down   Non-skid slipper socks- Performed by helper: Don/doff right sock, Don/doff left sock               TED Hose - Performed by helper: Don/doff right TED hose, Don/doff left TED hose  Lower body assist Assist for  lower body dressing: 2 Automotive engineer activity did not occur: No continent bowel/bladder event Toileting steps completed by patient: Adjust clothing prior to toileting Toileting steps completed by helper: Adjust clothing prior to toileting, Performs perineal hygiene, Adjust clothing after toileting Toileting Assistive Devices: Grab bar or rail  Toileting assist Assist level: Two helpers   Transfers Chair/bed transfer   Chair/bed transfer [place]: Stand pivot Chair/bed  transfer assist level: Moderate assist (Pt 50 - 74%/lift or lower) Chair/bed transfer assistive device: Armrests, Walker Mechanical lift: Ecologist     Max distance: 150 Assist level: 2 helpers   Wheelchair   Type: Manual Max wheelchair distance: 44ft Assist Level: Moderate assistance (Pt 50 - 74%)  Cognition Comprehension Comprehension assist level: Understands basic 50 - 74% of the time/ requires cueing 25 - 49% of the time  Expression Expression assist level: Expresses basic 25 - 49% of the time/requires cueing 50 - 75% of the time. Uses single words/gestures.  Social Interaction Social Interaction assist level: Interacts appropriately 25 - 49% of time - Needs frequent redirection.  Problem Solving Problem solving assist level: Solves basic 50 - 74% of the time/requires cueing 25 - 49% of the time  Memory Memory assist level: Recognizes or recalls less than 25% of the time/requires cueing greater than 75% of the time    Medical Problem List and Plan: 1.  Right lean, left-sided weakness,dysphagia, cognitive deficits and difficulty standing secondary to right MCA infarct with GI bleed.  Continue CIR 2.  DVT Prophylaxis/Anticoagulation: Mechanical:  Antiembolism stockings, knee (TED hose) Bilateral lower extremities 3. Pain Management: tylenol prn 4. Mood: LCSW to follow for evaluation and support as needed.  5. Neuropsych: This patient is not capable of making decisions on his own behalf. 6. Skin/Wound Care: routine pressure relief measures.  7. Fluids/Electrolytes/Nutrition: Monitor I/O.    -intake fair to avg  -I personally reviewed the patient's labs today.  Push fluids 8. ABLA due to duodenal ulcer/GIB:   Monitor H/H serial checks.   Increased protonix to 40 mg bid.  Hemoglobin 11.5 today 8/16 9.  Chronic A fib: Monitor HR bid. No anticoagulation for now. No medications due to hypotension.  10. Delirium/sundowning: Continue Depakote tid    Seroquel DC'd  on 8/8 due to oversedation, trazodone ineffective, restoril started on 8/9---overal sleep patterns improved but still inconsistent  -check depakote level  -check urinalysis and ucx given ongoing incontinence 11. Constipation  Increased senna to bid.  Had bm 8/11   12. Prediabetes  Reasonable control 13. Hypoalbuminemia  Supplement initiated on 8/6 14. Blood pressure  -reasonable control  LOS (Days) 10 A FACE TO FACE EVALUATION WAS PERFORMED  Meredith Staggers 12/14/2017 9:31 AM

## 2017-12-14 NOTE — Progress Notes (Signed)
Physical Therapy Session Note  Patient Details  Name: Dakota [name] MRN: [id number] Date of Birth: 01-Jan-1929  Today's Date: 12/14/2017 PT Individual Time: 1305-1405 And 7169-6789 PT Individual Time Calculation (min): 60 min and 25 min  Short Term Goals: Anabel Halon 2:  PT Short Term Goal 1 (Week 2): Patient to perform rolling and sit to supine/supine to sit transfers with ModA PT Short Term Goal 2 (Week 2): Patient to be able to complete stand-pivot transfers with MinA  PT Short Term Goal 3 (Week 2): Patient to be able to ambulate at least 33f with ModA of 1 and LRAD  PT Short Term Goal 4 (Week 2): Family to be educated and able to demonstrate basic bed mobility and sit to stand transfer mechanics with patient and close S from PT staff   Skilled Therapeutic Interventions/Progress Updates:  Session  1 Pt received sitting in WC and agreeable to PT. [facility] reciprocal movement and attention  Training on knietron, 30cm/sec, 5 x 1 min with 1 min rest break between bouts. Pt required moderate cues for continued attention to task beyond 30-40sec on each set in moderately distracting environment. Cues also provided for improved sitting posture to prevent lateral lean to the L. Static standing balance on kinetron 2x 30sec to maintain even weight distrobution between the R and the L. Standing marcges 2 x 30sec with UE support on rails.   Gait training with RW and min fading to moderate assist from PT x 1671f. moderat cues for improved posture, to prevent L lateral lean, increased R weight shifting, and attention to the L for safety in turns and obstacle negotiation.   Standing balance without AD to perform lateral reach to the L as well as horseshoe toss 2 x 6 with min assist from PT for safety and improved attention to the L and r weight shifting.   WC mobility training x 5064fith min assist and max cues for improved use of the LUE. Pt returned to room and performed stand  pivot transfer to bed with min assist. Pt doffed shoes EOB with supervision assist. Sit>supine completed with supervision assist, and left supine in bed with call bell in reach and all needs met.   Session 2.  Pt received supine in bed and agreeable to PT. Supine>sit transfer with mod assist and moderate cues for arousal. Pt noted to have significant improvement in arousal once in sitting EOB.   Gait training with moderate assist x [id number] HHA from PT. Constant LOB to the L unless PT facilitated truck extension through LUE flexion to ~ 90deg. Sit<>stand completed x 3 throughout treatment with min assist at EOB with UE support on bed rails. RN requesting pt to attempt to urinate for urin culture. Pt performed standing balance x 1 min to attempt to urinate without success. Pt returned to room and performed sit>stand transfer to bed with min assist. Sit>supine completed with min assist to control the LLE and left supine in bed with call bell in reach and all needs met.          Therapy Documentation Precautions:  Precautions Precautions: Fall Precaution Comments: right gaze, left hemiparesis, left lean /pushing Restrictions Weight Bearing Restrictions: No    Pain: Pain Assessment Pain Scale: Faces Faces Pain Scale: Hurts a little bit Pain Type: Chronic pain Pain Location: Back Pain Orientation: Lower Pain Descriptors / Indicators: Discomfort Pain Onset: With Activity Pain Intervention(s): Repositioned   See Function Navigator  for Current Functional Status.   Therapy/Group: Individual Therapy  [name] 12/14/2017, 2:11 PM

## 2017-12-14 NOTE — Progress Notes (Signed)
Occupational Therapy Session Note  Patient Details  Name: Dakota [name] MRN: [id number] Date of Birth: [birth year]  Today's Date: 12/14/2017 OT Individual Time: 1003-1104 OT Individual Time Calculation (min): 61 min    Short Term Goals: Week 2:  OT Short Term Goal 1 (Week 2): Pt completed UB selfcare sitting with min assist.  OT Short Term Goal 2 (Week 2): Pt will complete LB bathing min assist sit to stand.   OT Short Term Goal 3 (Week 2): Pt will complete LB dressing sit to stand with mod assist. OT Short Term Goal 4 (Week 2): Pt will complete toilet transfers and shower transfers stand pivot with min assist.   OT Short Term Goal 5 (Week 2): Pt will locate grooming and bathing items left of midline with no more and min instructional cueing.  Skilled Therapeutic Interventions/Progress Updates:    Pt completed supine to sit EOB for bathing and dressing with mod assist.  Once sitting on the EOB he continued to demonstrate slight left lean.  During bathing and dressing tasks he still exhibited pushing to the left with occasional LOB when crossing his LEs for washing his feet.  Mod assist to try and regain balance and maintain more midline orientation.  He was able to complete UB bathing with max instructional cueing and min assist.  LB bathing with mod assist sit to stand.  Frequent spontaneous of the LUE with all bathing tasks, however he demonstrated frequent dropping of the washcloth and inability to grasp clothing.  Motor planning and perceptual deficits also noted when attempting to orient and donn shirt.  Max demonstrational cueing for locating the bottom of the shirt in order to donn his UEs up through it and out the corresponding shirt sleeves.  He was able to thread his pants and then stand with mod assist.  Once again max instructional cueing needed for hemidressing techniques.  Max assist for donning the left shoe but he was able to donn the right, with max assist to tie both.  Finished  session with transfer to the wheelchair with mod assist stand pivot.  Pt's son present in the room for supervision at end of session.    Therapy Documentation Precautions:  Precautions Precautions: Fall Precaution Comments: right gaze, left hemiparesis, left lean /pushing Restrictions Weight Bearing Restrictions: No   Pain: Pain Assessment Pain Scale: Faces Faces Pain Scale: Hurts a little bit Pain Type: Chronic pain Pain Location: Back Pain Orientation: Lower Pain Descriptors / Indicators: Discomfort Pain Onset: With Activity Pain Intervention(s): Repositioned ADL: See Function Navigator for Current Functional Status.   Therapy/Group: Individual Therapy  Jeanette Moffatt OTR/L 12/14/2017, 12:41 PM

## 2017-12-14 NOTE — Patient Care Conference (Signed)
Inpatient RehabilitationTeam Conference and Plan of Care Update Date: 12/13/2017   Time: 11:35 AM    Patient Name: Dakota [name]      Medical Record Number: [id number]  Date of Birth: 16-Jul-1928 Sex: Male         Room/Bed: 4M11C/4M11C-01 Payor Info: Payor: MEDICARE / Plan: MEDICARE PART A AND B / Product Type: *No Product type* /    Admitting Diagnosis: R MCA  Admit Date/Time:  12/04/2017  3:53 PM Admission Comments: No comment available   Primary Diagnosis:  <principal problem not specified> Principal Problem: <principal problem not specified>  Patient Active Problem List   Diagnosis Date Noted  . Sundowning   . Labile blood pressure   . Slow transit constipation   . History of GI bleed   . Acute blood loss anemia   . Delirium   . GIB (gastrointestinal bleeding) 11/24/2017  . Hypotension 11/24/2017  . Hypovolemic shock ([place]) 11/24/2017  . Acute lower UTI   . Leucocytosis   . Hypoalbuminemia due to protein-calorie malnutrition ([place])   . Prediabetes   . Prerenal azotemia   . Subacute delirium 11/21/2017  . Dysphagia due to recent cerebrovascular accident 11/21/2017  . Chewing tobacco nicotine dependence 11/21/2017  . Leukocytosis 11/21/2017  . Acute ischemic right MCA stroke ([place]) 11/21/2017  . Cerebrovascular accident (CVA) due to thrombosis of right middle cerebral artery ([place])   . Acute deep vein thrombosis (DVT) of proximal vein of lower extremity ([name])   . AKI (acute kidney injury) ([place])   . Middle cerebral artery embolism, right s/p endovascular therapy 11/16/2017  . Caregiver stress 10/05/2015  . Hematuria 04/06/2015  . ARF (acute renal failure) ([place]) 07/18/2014  . Cardiogenic shock ([place]) 07/18/2014  . Atrial fibrillation with slow ventricular response ([place])   . NSTEMI (non-ST elevated myocardial infarction) ([place])   . CHB (complete heart block) - s/p MDT 1 lead PPM 07/17/2014  . Advance care planning 07/03/2014  . Sebaceous cyst 04/11/2013  . Medicare annual  wellness visit, subsequent 10/28/2012  . RIB PAIN, RIGHT SIDED 01/02/2009  . CAD, NATIVE VESSEL 11/25/2008  . Unspecified vitamin D deficiency 11/24/2008  . RECENT RET DETACH PARTIAL W/SINGLE DEFECT 11/24/2008  . Essential hypertension, benign 07/21/2008  . HLD (hyperlipidemia) 05/22/2007  . Atrial fibrillation ([place]) 05/22/2007  . GERD 05/22/2007  . GASTRIC ULCER, ACUTE, HEMORRHAGE 05/22/2007  . Hyperglycemia 05/22/2007    Expected Discharge Date: Expected Discharge Date: 12/26/17  Team Members Present: Physician leading conference: Dr. Alysia Penna Social Worker Present: Lennart Pall, LCSW Nurse Present: Isla Pence, RN PT Present: Barrie Folk, PT;Rosita Dechalus, PTA OT Present: Clyda Greener, OT PPS Coordinator present : Daiva Nakayama, RN, CRRN     Current Status/Progress Goal Weekly Team Focus  Medical   improving sleep and wakefulness, behaviorally better with increased cooperation, hgb/labs stable  improve participation with staft  nutrition, sleep, mgt of bp, behavioral considerations   Bowel/Bladder   Contient/incontinent of bowel/bladder; LBM 12/12/17  To be continent of boel/bladder with min assist  timed toileting   Swallow/Nutrition/ Hydration   Dys. 1 textures with thin liquids, Max A  Min A  trials of thin liquids with Dys. 1 textures, increased use of strategies    ADL's   Min assist for UB bathing and mod assist for LB selfcare, mod assist for toilet and shower transfers.  Left neglect still present, decreased LUE coordination and sensation  min assist overall  neuromuscular re-education, transfer training, selfcare retraining, balance retraining, pt/family education, vision  compensation   Mobility   modA bed mobilty, modA minA if awake/alert transfers, modA gait with RW, continues to demonstrate poor awareness/inattention to L, modA w/c mobility  min assist overall  L NMR, transfer, balance, gait, cognitive remediation   Communication   communication is  impacted by cognition, Max A   mod assist   use of increased vocal intensity    Safety/Cognition/ Behavioral Observations  Max A  min assist   attention, initiation, problem solving    Pain   no complain of pain  <2  assess and treat pain q shift and as needed   Skin   scattered bruising  no skin breakdown/infection with min assist  assess skin q shift and as needed    Rehab Goals Patient on target to meet rehab goals: Yes *See Care Plan and progress notes for long and short-term goals.     Barriers to Discharge  Current Status/Progress Possible Resolutions Date Resolved   Physician    Medical stability        ongoing medical mgt per progress notes      Nursing                  PT                    OT                  SLP Nutrition means Continues to be at high aspiration with thin liquids            SW                Discharge Planning/Teaching Needs:  Pt's sons have stated they will care for pt at home at d/c.    Family ed is ongoing with son who is here daily.   Team Discussion:  ?more fatigued today;  Mostly incont of urine.  Min-mod assist with self care and transfers.  Still pushing to the left with occ LOB.  Apraxic; visual-perceptual deficits and left neglect.  ST addressing swallow.  Slow progress this week.  Goals still min assist overall.  Revisions to Treatment Plan:  None    Continued Need for Acute Rehabilitation Level of Care: The patient requires daily medical management by a physician with specialized training in physical medicine and rehabilitation for the following conditions: Daily direction of a multidisciplinary physical rehabilitation program to ensure safe treatment while eliciting the highest outcome that is of practical value to the patient.: Yes Daily medical management of patient stability for increased activity during participation in an intensive rehabilitation regime.: Yes Daily analysis of laboratory values and/or radiology reports with any  subsequent need for medication adjustment of medical intervention for : Neurological problems;Blood pressure problems;Nutritional problems;Mood/behavior problems  Aubriella Perezgarcia 12/14/2017, 12:09 PM

## 2017-12-15 ENCOUNTER — Ambulatory Visit (HOSPITAL_COMMUNITY): Payer: Medicare Other | Admitting: Speech Pathology

## 2017-12-15 ENCOUNTER — Inpatient Hospital Stay (HOSPITAL_COMMUNITY): Payer: Medicare Other | Admitting: Occupational Therapy

## 2017-12-15 ENCOUNTER — Inpatient Hospital Stay (HOSPITAL_COMMUNITY): Payer: Medicare Other | Admitting: Physical Therapy

## 2017-12-15 LAB — URINALYSIS, COMPLETE (UACMP) WITH MICROSCOPIC
BILIRUBIN URINE: NEGATIVE
Bacteria, UA: NONE SEEN
GLUCOSE, UA: NEGATIVE mg/dL
HGB URINE DIPSTICK: NEGATIVE
KETONES UR: NEGATIVE mg/dL
Leukocytes, UA: NEGATIVE
NITRITE: NEGATIVE
PH: 8 (ref 5.0–8.0)
Protein, ur: NEGATIVE mg/dL
SPECIFIC GRAVITY, URINE: 1.013 (ref 1.005–1.030)

## 2017-12-15 MED ORDER — MAGNESIUM HYDROXIDE 400 MG/5ML PO SUSP
30.0000 mL | Freq: Every day | ORAL | Status: DC
  Administered 2017-12-15 – 2017-12-20 (×6): 30 mL via ORAL
  Filled 2017-12-15 (×6): qty 30

## 2017-12-15 MED ORDER — QUETIAPINE FUMARATE 25 MG PO TABS
12.5000 mg | ORAL_TABLET | Freq: Every day | ORAL | Status: DC
  Filled 2017-12-15: qty 1

## 2017-12-15 MED ORDER — QUETIAPINE FUMARATE 25 MG PO TABS
12.5000 mg | ORAL_TABLET | Freq: Every evening | ORAL | Status: DC | PRN
  Administered 2017-12-16: 12.5 mg via ORAL
  Filled 2017-12-15: qty 1

## 2017-12-15 NOTE — Progress Notes (Signed)
Speech Language Pathology Daily Session Note  Patient Details  Name: Dakota [name] MRN: [id number] Date of Birth: [birth year]  Today's Date: 12/15/2017 SLP Individual Time: 1100-1200 SLP Individual Time Calculation (min): 60 min  Short Term Goals: Week 2: SLP Short Term Goal 1 (Week 2): Pt will consume dys 1 textures and nectar thick liquids via cup with mod assist multimodal cues for use of compensatory swallow strategies (small bite, slow rate).  SLP Short Term Goal 2 (Week 2): Pt will complete basic familiar problem solving tasks related to ADLs with Mod A cues.  SLP Short Term Goal 3 (Week 2): Pt will sustain attention to basic, familiar tasks for ~10 minute intervals with mod assist multimodal cues.  SLP Short Term Goal 4 (Week 2): Pt will slow rate and increase vocal intensity to achieve ~50%  intelligibility at the phrase level with mod assist.   SLP Short Term Goal 5 (Week 2): Pt will locate items to the left of midline during basic familiar tasks in ~50% of opportunities with max assist multimodal cues.  SLP Short Term Goal 6 (Week 2): Pt will consume thin liquids via TSP with min overt s/s of aspiration and Mod A cues for use of compensatory swallow strategies.   Skilled Therapeutic Interventions: Skilled treatment session focused on cognition goals and education with son on pt's current deficits and POC for remainder of CIR admission. I think pt is at new baseline - I told son that pt's prognosis for further diet advancement is nonexistent d/t lengthy oral phase and incomplete clearing with puree and inability to remember to consume small sips of thin liquids via cup. Pt is able to consume thin liquids via cup with strict reminders in setting of water protocol but pt is not requesting water - son is. Pt's swallow is very delayed and bolus is contained in pyriform sinuses (they are large) but with larger sips he can't contain bolus. Also provided that pt is very cognitively impaired and  amount of care that will be required at discharge is not likely to change at time discharge. Pt's cognitive abilities are further compromised by moments of confusion and off-topic comments. Will be switching to caregiver education in next week with time spent having family thicken liquids. Pt continues to largely Max A for sustained attention, orientation/intellectual awareness, basic problem solving, slow rate/ vocal intensity at word level, left awareness - requires overall MAX A. SLP further facilitated session by providing Total A to Max A to place days of week in order on wall calendar and to place numbers in pockets on calendar. Despite Max A pt was not able to count simple amounts of money and responded to SLP cues with confused comments (related to half cents etc despite Total A). Pt's son present to observe deficits. Pt with multiple request to go home during session.      Function:  Eating Eating   Modified Consistency Diet: Yes Eating Assist Level: Supervision or verbal cues;Help managing cup/glass;Help with picking up utensils   Eating Set Up Assist For: Opening containers Helper Scoops Food on Utensil: Occasionally Helper Brings Food to Mouth: Occasionally   Cognition Comprehension Comprehension assist level: Understands basic less than 25% of the time/ requires cueing >75% of the time  Expression   Expression assist level: Expresses basic 25 - 49% of the time/requires cueing 50 - 75% of the time. Uses single words/gestures.  Social Interaction Social Interaction assist level: Interacts appropriately 25 - 49% of time - Needs frequent  redirection.  Problem Solving Problem solving assist level: Solves basic less than 25% of the time - needs direction nearly all the time or does not effectively solve problems and may need a restraint for safety  Memory Memory assist level: Recognizes or recalls less than 25% of the time/requires cueing greater than 75% of the time    Pain Pain  Assessment Pain Scale: Faces Pain Score: 0-No pain Faces Pain Scale: No hurt  Therapy/Group: Individual Therapy  Emmi Wertheim 12/15/2017, 12:28 PM

## 2017-12-15 NOTE — Progress Notes (Addendum)
[place] PHYSICAL MEDICINE & REHABILITATION     PROGRESS NOTE  Subjective/Complaints:   Slept a little better. Up eating breakfast  ROS: Limited due to cognitive/behavioral    Objective: Vital Signs: Blood pressure 139/88, pulse 91, temperature 98 F (36.7 C), temperature source Oral, resp. rate (!) 21, height 5\' 7"  (1.702 m), weight 76.5 kg, SpO2 99 %. No results found. Recent Labs    12/14/17 0459  WBC 7.8  HGB 11.5*  HCT 36.5*  PLT 286   Recent Labs    12/14/17 0459  NA 138  K 4.3  CL 100  GLUCOSE 103*  BUN 23  CREATININE 1.01  CALCIUM 9.0   CBG (last 3)  No results for input(s): GLUCAP in the last 72 hours.  Wt Readings from Last 3 Encounters:  12/12/17 76.5 kg  12/04/17 78.1 kg  11/21/17 81.7 kg    Physical Exam:  BP 139/88 (BP Location: Right Arm)   Pulse 91   Temp 98 F (36.7 C) (Oral)   Resp (!) 21   Ht 5\' 7"  (1.702 m)   Wt 76.5 kg   SpO2 99%   BMI 26.41 kg/m  Constitutional: No distress . Vital signs reviewed. HEENT: EOMI, oral membranes moist Neck: supple Cardiovascular: RRR without murmur. No JVD    Respiratory: CTA Bilaterally without wheezes or rales. Normal effort    GI: BS +, non-tender, non-distended  Musculoskeletal: No edema or tenderness in extremities  Neurological: HOH, fairly alert today Dysarthric Oriented 2 Motor: Right upper extremity: 5/5 proximal distal Left upper extremity: 4+/5 proximal to distal Bilateral lower extremities: Hip flexion 3+/5, knee extension 3/5, ankle dorsiflexion 4+/5 -stable Left inattention present Skin: Skin is warm and dry.  Psychiatric: alert   Assessment/Plan: 1. Functional deficits secondary to right infarct with GI bleed which require 3+ hours per day of interdisciplinary therapy in a comprehensive inpatient rehab setting. Physiatrist is providing close team supervision and 24 hour management of active medical problems listed below. Physiatrist and rehab team continue to assess barriers  to discharge/monitor patient progress toward functional and medical goals.  Function:  Bathing Bathing position   Position: Shower  Bathing parts Body parts bathed by patient: Right arm, Left arm, Chest, Abdomen, Right upper leg, Left upper leg, Left lower leg, Right lower leg Body parts bathed by helper: Front perineal area, Buttocks, Back  Bathing assist Assist Level: 2 helpers      Upper Body Dressing/Undressing Upper body dressing   What is the patient wearing?: Pull over shirt/dress     Pull over shirt/dress - Perfomed by patient: Thread/unthread left sleeve, Put head through opening, Pull shirt over trunk Pull over shirt/dress - Perfomed by helper: Thread/unthread right sleeve        Upper body assist Assist Level: Touching or steadying assistance(Pt > 75%)      Lower Body Dressing/Undressing Lower body dressing   What is the patient wearing?: Pants, Non-skid slipper socks, Shoes, [employer]- Performed by patient: Thread/unthread right pants leg, Thread/unthread left pants leg Pants- Performed by helper: Pull pants up/down Non-skid slipper socks- Performed by patient: Don/doff right sock, Don/doff left sock Non-skid slipper socks- Performed by helper: Don/doff right sock, Don/doff left sock     Shoes - Performed by patient: Don/doff right shoe Shoes - Performed by helper: Don/doff left shoe, Fasten right, Fasten left       TED Hose - Performed by helper: Don/doff right TED hose, Don/doff left TED hose  Lower body assist Assist for lower body dressing: 2 Helpers      Toileting Toileting Toileting activity did not occur: No continent bowel/bladder event Toileting steps completed by patient: Adjust clothing prior to toileting Toileting steps completed by helper: Adjust clothing prior to toileting, Performs perineal hygiene, Adjust clothing after toileting Toileting Assistive Devices: Grab bar or rail  Toileting assist Assist level: Two helpers    Transfers Chair/bed transfer   Chair/bed transfer method: Stand pivot Chair/bed transfer assist level: Touching or steadying assistance (Pt > 75%) Chair/bed transfer assistive device: Armrests, Walker Mechanical lift: Ecologist     Max distance: 175ft Assist level: Moderate assist (Pt 50 - 74%)   Wheelchair   Type: Manual Max wheelchair distance: 53ft Assist Level: Moderate assistance (Pt 50 - 74%)  Cognition Comprehension Comprehension assist level: Understands basic 50 - 74% of the time/ requires cueing 25 - 49% of the time  Expression Expression assist level: Expresses basic 25 - 49% of the time/requires cueing 50 - 75% of the time. Uses single words/gestures.  Social Interaction Social Interaction assist level: Interacts appropriately 25 - 49% of time - Needs frequent redirection.  Problem Solving Problem solving assist level: Solves basic 50 - 74% of the time/requires cueing 25 - 49% of the time  Memory Memory assist level: Recognizes or recalls less than 25% of the time/requires cueing greater than 75% of the time    Medical Problem List and Plan: 1.  Right lean, left-sided weakness,dysphagia, cognitive deficits and difficulty standing secondary to right MCA infarct with GI bleed.  Continue CIR 2.  DVT Prophylaxis/Anticoagulation: Mechanical:  Antiembolism stockings, knee (TED hose) Bilateral lower extremities 3. Pain Management: tylenol prn 4. Mood: LCSW to follow for evaluation and support as needed.  5. Neuropsych: This patient is not capable of making decisions on his own behalf. 6. Skin/Wound Care: routine pressure relief measures.  7. Fluids/Electrolytes/Nutrition: Monitor I/O.    -intake fair to good 8. ABLA due to duodenal ulcer/GIB:   Monitor H/H serial checks.   Increased protonix to 40 mg bid.  Hemoglobin 11.5 8/15 9.  Chronic A fib: Monitor HR bid. No anticoagulation for now. No medications due to hypotension.  10. Delirium/sundowning:  improved, more alert today  -Continue Depakote tid    Seroquel DC'd on 8/8 due to oversedation, trazodone ineffective, restoril started on 8/9---overal sleep patterns improved but still inconsistent  -  depakote level subtherapeutic--maintain same dose  -urinalysis negative, ucx pending 11. Constipation  Increased senna to bid.  Had bm 8/11   12. Prediabetes  Reasonable control 13. Hypoalbuminemia  Supplement initiated on 8/6, po intake improved 14. Blood pressure  -reasonable control 8/16  LOS (Days) 11 A FACE TO FACE EVALUATION WAS PERFORMED  Meredith Staggers 12/15/2017 9:50 AM

## 2017-12-15 NOTE — Progress Notes (Signed)
Physical Therapy Session Note  Patient Details  Name: Dakota [name] MRN: [id number] Date of Birth: [birth year]  Today's Date: 12/15/2017 PT Individual Time: 1300-1350 PT Individual Time Calculation (min): 50 min   Short Term Goals: Week 2:  PT Short Term Goal 1 (Week 2): Patient to perform rolling and sit to supine/supine to sit transfers with ModA PT Short Term Goal 2 (Week 2): Patient to be able to complete stand-pivot transfers with MinA  PT Short Term Goal 3 (Week 2): Patient to be able to ambulate at least 56f with ModA of 1 and LRAD  PT Short Term Goal 4 (Week 2): Family to be educated and able to demonstrate basic bed mobility and sit to stand transfer mechanics with patient and close S from PT staff  Week 3:     Skilled Therapeutic Interventions/Progress Updates:   Pt received sitting in on toilet and agreeable to PT. Clothing management and stand pivot transfer with mod assist to WC.   Gait training with RW and kicking yoga block on the L to force step through on the L and weight shifting R.Min-mod assist from PT with increased L lateral lean with fatigue. Pt able to correct with max cues and standing rest break.   Seated balance to perform lateral reaches R and L x 10 Bil and trunk rotation with cross body reach. Poor motor planning and increased neglect of the L UE and LLE compared to previous sessions noted limiting effectiveness of seated NMR.   PT attempted to instruct pt in gait training without AD, but pt perseverating on potential need to clear bowels and unable to attend to task safely. PT transported pt to bathroom in Room. Stand pivot transfer to toilet with mod assist, pt left sitting on toilet with call bell in reach and all needs met as well as as son in room .       Therapy Documentation Precautions:  Precautions Precautions: Fall Precaution Comments: right gaze, left hemiparesis, left lean /pushing Restrictions Weight Bearing Restrictions: No Vital  Signs: Therapy Vitals Temp: 99 F (37.2 C) Temp Source: Oral Pulse Rate: 89 Resp: 18 BP: (!) 105/59 Patient Position (if appropriate): Sitting Oxygen Therapy SpO2: 98 % O2 Device: Room Air  Pain: denies    See Function Navigator for Current Functional Status.   Therapy/Group: Individual Therapy  ALorie Phenix8/16/2019, 2:17 PM

## 2017-12-15 NOTE — Progress Notes (Signed)
Occupational Therapy Session Note  Patient Details  Name: Dakota [name] MRN: [id number] Date of Birth: [birth year]  Today's Date: 12/15/2017 OT Individual Time: 0900-1015 OT Individual Time Calculation (min): 75 min    Short Term Goals: Week 2:  OT Short Term Goal 1 (Week 2): Pt completed UB selfcare sitting with min assist.  OT Short Term Goal 2 (Week 2): Pt will complete LB bathing min assist sit to stand.   OT Short Term Goal 3 (Week 2): Pt will complete LB dressing sit to stand with mod assist. OT Short Term Goal 4 (Week 2): Pt will complete toilet transfers and shower transfers stand pivot with min assist.   OT Short Term Goal 5 (Week 2): Pt will locate grooming and bathing items left of midline with no more and min instructional cueing.  Skilled Therapeutic Interventions/Progress Updates:    Pt received supine in bed with son present. Session focused on L UE NMR through b/d tasks, R weight shifting, and functional mobility. Pt sat EOB, fluctuating between (S) level and mod A to maintain midline orientation d/t significant L lateral lean and reluctance to weight shift toward R side. Pt demo improvement locating items and attending to L visual field without vc this session. Pt transferred EOB to w/c with min A and minimal vc for technique. Questioning cues provided during dressing to attempt and allow pt to problem solve through incorrect threading (UE/LE), but pt unable to self correct, requiring min A manual facilitation. Pt was brought down to therapy gym and completed functional reaching activity. Vc/demo provided for pt to weight shift toward R and use L UE to reach under hip. Pt completed several trials standing with min-CGA and reaching forward with L UE. Pt finished session with 100 ft of functional mobility with +2 min-mod HHA on either side. With increased fatigue pt requires more physical A and vc for midline orientation. Pt was left sitting up in w/c with son prssent and belt alarm  fastened.   Therapy Documentation Precautions:  Precautions Precautions: Fall Precaution Comments: right gaze, left hemiparesis, left lean /pushing Restrictions Weight Bearing Restrictions: No  Pain: Pain Assessment Pain Scale: Faces Pain Score: 0-No pain Faces Pain Scale: No hurt  See Function Navigator for Current Functional Status.   Therapy/Group: Individual Therapy  Curtis Sites 12/15/2017, 10:17 AM

## 2017-12-15 NOTE — Progress Notes (Signed)
Social Work Patient ID: Dakota [name], male   DOB: Mar 12, 1929, 84 y.o.   MRN: [id number]  Have reviewed team conference with pt and son, Sigmond.  Aware and agreeable that team continues to plan toward 8/27 d/c and stressed that goals remain for min assist, however, need to stress that caregivers will need to complete education beginning next week.  Explained that we will need to train caregivers on physically assisting pt as well as managing food and liquid consistency.  Son appears somewhat frustrated with the slow progress and states, "he needs to get this arm working..."  When questioned if he feels he and other family members will be able to manage the level of care pt needs, son states, "I guess we won't know til we get him home."  I then reiterated need to plan for family ed to begin by mid week next week to start to determine this PRIOR to d/c.  Will follow up with son on Monday.  Rick Carruthers, LCSW

## 2017-12-15 NOTE — Plan of Care (Signed)
  Problem: RH SKIN INTEGRITY Goal: RH STG MAINTAIN SKIN INTEGRITY WITH ASSISTANCE Description STG Maintain Skin Integrity With  [place].  Outcome: Progressing   Problem: RH SAFETY Goal: RH STG ADHERE TO SAFETY PRECAUTIONS W/ASSISTANCE/DEVICE Description STG Adhere to Safety Precautions With  Mod Assistance/Device.   Outcome: Progressing Goal: RH STG DECREASED RISK OF FALL WITH ASSISTANCE Description STG Decreased Risk of Fall With  Mod Assistance.   Outcome: Progressing   Problem: RH PAIN MANAGEMENT Goal: RH STG PAIN MANAGED AT OR BELOW PT'S PAIN GOAL Description Less than 3   Outcome: Progressing

## 2017-12-16 ENCOUNTER — Inpatient Hospital Stay (HOSPITAL_COMMUNITY): Payer: Medicare Other

## 2017-12-16 DIAGNOSIS — I639 Cerebral infarction, unspecified: Secondary | ICD-10-CM

## 2017-12-16 DIAGNOSIS — G8114 Spastic hemiplegia affecting left nondominant side: Secondary | ICD-10-CM

## 2017-12-16 MED ORDER — MIRTAZAPINE 15 MG PO TBDP
15.0000 mg | ORAL_TABLET | Freq: Every day | ORAL | Status: DC
  Filled 2017-12-16: qty 1

## 2017-12-16 MED ORDER — MIRTAZAPINE 15 MG PO TBDP
15.0000 mg | ORAL_TABLET | Freq: Every evening | ORAL | Status: DC | PRN
  Administered 2017-12-16: 15 mg via ORAL
  Filled 2017-12-16 (×2): qty 1

## 2017-12-16 NOTE — Plan of Care (Signed)
  Problem: RH SAFETY Goal: RH STG ADHERE TO SAFETY PRECAUTIONS W/ASSISTANCE/DEVICE Description STG Adhere to Safety Precautions With  Mod Assistance/Device.   Outcome: Progressing  Call light at hand

## 2017-12-16 NOTE — Progress Notes (Signed)
Pt became agitated apprx 0133, administer prn regimen. Pt slept well throughout the shift

## 2017-12-16 NOTE — Progress Notes (Signed)
[place] PHYSICAL MEDICINE & REHABILITATION     PROGRESS NOTE  Subjective/Complaints:   Continues with sundowning, but is sedated this am.  Poor sleep Viona Gilmore cycle  ROS: Limited due to cognitive/behavioral    Objective: Vital Signs: Blood pressure 108/61, pulse (!) 101, temperature 98.2 F (36.8 C), temperature source Oral, resp. rate 17, height 5\' 7"  (1.702 m), weight 78.4 kg, SpO2 96 %. No results found. Recent Labs    12/14/17 0459  WBC 7.8  HGB 11.5*  HCT 36.5*  PLT 286   Recent Labs    12/14/17 0459  NA 138  K 4.3  CL 100  GLUCOSE 103*  BUN 23  CREATININE 1.01  CALCIUM 9.0   CBG (last 3)  No results for input(s): GLUCAP in the last 72 hours.  Wt Readings from Last 3 Encounters:  12/16/17 78.4 kg  12/04/17 78.1 kg  11/21/17 81.7 kg    Physical Exam:  BP 108/61 (BP Location: Right Arm)   Pulse (!) 101   Temp 98.2 F (36.8 C) (Oral)   Resp 17   Ht 5\' 7"  (1.702 m)   Wt 78.4 kg   SpO2 96%   BMI 27.07 kg/m  Constitutional: No distress . Vital signs reviewed. HEENT: EOMI, oral membranes moist Neck: supple Cardiovascular: RRR without murmur. No JVD    Respiratory: CTA Bilaterally without wheezes or rales. Normal effort    GI: BS +, non-tender, non-distended  Musculoskeletal: No edema or tenderness in extremities  Neurological: HOH, fairly alert today Dysarthric Oriented 2 Motor: cannot perform due to sedation, wince with Right sided  Left inattention present Skin: Skin is warm and dry.  Psychiatric: alert   Assessment/Plan: 1. Functional deficits secondary to right MCA infarct with GI bleed which require 3+ hours per day of interdisciplinary therapy in a comprehensive inpatient rehab setting. Physiatrist is providing close team supervision and 24 hour management of active medical problems listed below. Physiatrist and rehab team continue to assess barriers to discharge/monitor patient progress toward functional and medical  goals.  Function:  Bathing Bathing position   Position: Sitting EOB  Bathing parts Body parts bathed by patient: Right arm, Left arm, Chest, Abdomen, Right upper leg, Left upper leg, Left lower leg, Right lower leg, Front perineal area, Buttocks Body parts bathed by helper: Back  Bathing assist Assist Level: Touching or steadying assistance(Pt > 75%)      Upper Body Dressing/Undressing Upper body dressing   What is the patient wearing?: Pull over shirt/dress     Pull over shirt/dress - Perfomed by patient: Thread/unthread left sleeve, Put head through opening, Pull shirt over trunk Pull over shirt/dress - Perfomed by helper: Thread/unthread right sleeve        Upper body assist Assist Level: Touching or steadying assistance(Pt > 75%)      Lower Body Dressing/Undressing Lower body dressing   What is the patient wearing?: Pants, Shoes, [employer]- Performed by patient: Thread/unthread right pants leg, Thread/unthread left pants leg, Pull pants up/down Pants- Performed by helper: Pull pants up/down Non-skid slipper socks- Performed by patient: Don/doff right sock, Don/doff left sock Non-skid slipper socks- Performed by helper: Don/doff right sock, Don/doff left sock     Shoes - Performed by patient: Don/doff right shoe Shoes - Performed by helper: Don/doff left shoe, Fasten right, Fasten left       TED Hose - Performed by helper: Don/doff right TED hose, Don/doff left TED hose  Lower body assist Assist  for lower body dressing: Touching or steadying assistance (Pt > 75%)      Toileting Toileting Toileting activity did not occur: No continent bowel/bladder event Toileting steps completed by patient: Adjust clothing prior to toileting Toileting steps completed by helper: Adjust clothing prior to toileting, Performs perineal hygiene, Adjust clothing after toileting Toileting Assistive Devices: Grab bar or rail  Toileting assist Assist level: Two helpers    Transfers Chair/bed transfer   Chair/bed transfer [place]: Stand pivot Chair/bed transfer assist level: Touching or steadying assistance (Pt > 75%) Chair/bed transfer assistive device: Armrests, Walker Mechanical lift: [employer]     Max distance: 100 ft Assist level: Moderate assist (Pt 50 - 74%)   Wheelchair   Type: Manual Max wheelchair distance: 45ft Assist Level: Moderate assistance (Pt 50 - 74%)  Cognition Comprehension Comprehension assist level: Understands basic 25 - 49% of the time/ requires cueing 50 - 75% of the time  Expression Expression assist level: Expresses basic 25 - 49% of the time/requires cueing 50 - 75% of the time. Uses single words/gestures.  Social Interaction Social Interaction assist level: Interacts appropriately 25 - 49% of time - Needs frequent redirection.  Problem Solving Problem solving assist level: Solves basic 50 - 74% of the time/requires cueing 25 - 49% of the time  Memory Memory assist level: Recognizes or recalls 25 - 49% of the time/requires cueing 50 - 75% of the time    Medical Problem List and Plan: 1.  Right lean, left-sided weakness,dysphagia, cognitive deficits and difficulty standing secondary to right MCA infarct with GI bleed.  Continue CIR PT, OT- per son pt does better with cognition "once he steps out of hospital"  Allow grounds pass with family if pt is alert and not agitated 2.  DVT Prophylaxis/Anticoagulation: Mechanical:  Antiembolism stockings, knee (TED hose) Bilateral lower extremities 3. Pain Management: tylenol prn 4. Mood: LCSW to follow for evaluation and support as needed.  5. Neuropsych: This patient is not capable of making decisions on his own behalf. 6. Skin/Wound Care: routine pressure relief measures.  7. Fluids/Electrolytes/Nutrition: Monitor I/O.    -intake fair to good 8. ABLA due to duodenal ulcer/GIB:   Monitor H/H serial checks.   Increased protonix to 40 mg bid.  Hemoglobin 11.5  8/15 9.  Chronic A fib: Monitor HR bid. No anticoagulation for now. No medications due to hypotension.  10. Delirium/sundowning:ongoing issue add low dose mirtazepine, change restoril to prn  -Continue Depakote tid    Seroquel DC'd on 8/8 due to oversedation, trazodone ineffective, restoril started on 8/9---overal sleep patterns improved but still inconsistent, some sundowning , Seroquel restarted - given at 2am sedated this am will d/c seroquel as even 12.5mg  dose results in "hangover" effect  -  depakote level subtherapeutic--maintain same dose- no seizure  - 11. Constipation  Increased senna to bid.  Had bm 8/11   12. Prediabetes  Reasonable control 13. Hypoalbuminemia  Supplement initiated on 8/6, po intake improved 14. Blood pressure  -reasonable control 8/16  LOS (Days) 12 A FACE TO FACE EVALUATION WAS PERFORMED  Charlett Blake 12/16/2017 8:14 AM

## 2017-12-17 MED ORDER — TEMAZEPAM 7.5 MG PO CAPS
7.5000 mg | ORAL_CAPSULE | Freq: Every evening | ORAL | Status: DC | PRN
  Administered 2017-12-17: 7.5 mg via ORAL
  Filled 2017-12-17: qty 1

## 2017-12-17 MED ORDER — MIRTAZAPINE 15 MG PO TBDP
15.0000 mg | ORAL_TABLET | Freq: Every day | ORAL | Status: DC
  Administered 2017-12-17 – 2017-12-25 (×9): 15 mg via ORAL
  Filled 2017-12-17 (×9): qty 1

## 2017-12-17 NOTE — Plan of Care (Signed)
  Problem: RH BOWEL ELIMINATION Goal: RH STG MANAGE BOWEL WITH ASSISTANCE Description STG Manage Bowel with mod  Assistance.   Outcome: Progressing   Problem: RH BLADDER ELIMINATION Goal: RH STG MANAGE BLADDER WITH ASSISTANCE Description STG Manage Bladder With  Mod Assistance   Outcome: Progressing   Problem: RH SKIN INTEGRITY Goal: RH STG SKIN FREE OF INFECTION/BREAKDOWN Description Patient and family will be able to verbalize understanding of preventing breakdown   Outcome: Progressing   Problem: RH SAFETY Goal: RH STG DECREASED RISK OF FALL WITH ASSISTANCE Description STG Decreased Risk of Fall With  Mod Assistance.   Outcome: Progressing   Problem: RH PAIN MANAGEMENT Goal: RH STG PAIN MANAGED AT OR BELOW PT'S PAIN GOAL Description Less than 3   Outcome: Progressing

## 2017-12-17 NOTE — Progress Notes (Signed)
[place] PHYSICAL MEDICINE & REHABILITATION     PROGRESS NOTE  Subjective/Complaints:   More awake this am, oriented to person and place not time  ROS: Limited due to cognitive/behavioral    Objective: Vital Signs: Blood pressure 132/88, pulse 98, temperature 98.3 F (36.8 C), temperature source Oral, resp. rate 18, height 5\' 7"  (1.702 m), weight 78.2 kg, SpO2 97 %. No results found. No results for input(s): WBC, HGB, HCT, PLT in the last 72 hours. No results for input(s): NA, K, CL, GLUCOSE, BUN, CREATININE, CALCIUM in the last 72 hours.  Invalid input(s): CO CBG (last 3)  No results for input(s): GLUCAP in the last 72 hours.  Wt Readings from Last 3 Encounters:  12/17/17 78.2 kg  12/04/17 78.1 kg  11/21/17 81.7 kg    Physical Exam:  BP 132/88 (BP Location: Right Arm)   Pulse 98   Temp 98.3 F (36.8 C) (Oral)   Resp 18   Ht 5\' 7"  (1.702 m)   Wt 78.2 kg   SpO2 97%   BMI 27.02 kg/m  Constitutional: No distress . Vital signs reviewed. HEENT: EOMI, oral membranes moist Neck: supple Cardiovascular: RRR without murmur. No JVD    Respiratory: CTA Bilaterally without wheezes or rales. Normal effort    GI: BS +, non-tender, non-distended  Musculoskeletal: No edema or tenderness in extremities  Neurological: HOH, fairly alert today Dysarthric Oriented 2 Motor: cannot perform due to sedation, wince with Right sided  Left inattention present Skin: Skin is warm and dry.  Psychiatric: alert   Assessment/Plan: 1. Functional deficits secondary to right MCA infarct with GI bleed which require 3+ hours per day of interdisciplinary therapy in a comprehensive inpatient rehab setting. Physiatrist is providing close team supervision and 24 hour management of active medical problems listed below. Physiatrist and rehab team continue to assess barriers to discharge/monitor patient progress toward functional and medical goals.  Function:  Bathing Bathing position   Position:  Sitting EOB  Bathing parts Body parts bathed by patient: Right arm, Left arm, Chest, Abdomen, Right upper leg, Left upper leg, Left lower leg, Right lower leg, Front perineal area, Buttocks Body parts bathed by helper: Back  Bathing assist Assist Level: Touching or steadying assistance(Pt > 75%)      Upper Body Dressing/Undressing Upper body dressing   What is the patient wearing?: Pull over shirt/dress     Pull over shirt/dress - Perfomed by patient: Thread/unthread left sleeve, Put head through opening, Pull shirt over trunk Pull over shirt/dress - Perfomed by helper: Thread/unthread right sleeve        Upper body assist Assist Level: Touching or steadying assistance(Pt > 75%)      Lower Body Dressing/Undressing Lower body dressing   What is the patient wearing?: Pants, Shoes, [employer]- Performed by patient: Thread/unthread right pants leg, Thread/unthread left pants leg, Pull pants up/down Pants- Performed by helper: Pull pants up/down Non-skid slipper socks- Performed by patient: Don/doff right sock, Don/doff left sock Non-skid slipper socks- Performed by helper: Don/doff right sock, Don/doff left sock     Shoes - Performed by patient: Don/doff right shoe Shoes - Performed by helper: Don/doff left shoe, Fasten right, Fasten left       TED Hose - Performed by helper: Don/doff right TED hose, Don/doff left TED hose  Lower body assist Assist for lower body dressing: Touching or steadying assistance (Pt > 75%)      Toileting Toileting Toileting activity did not  occur: No continent bowel/bladder event Toileting steps completed by patient: Adjust clothing prior to toileting Toileting steps completed by helper: Adjust clothing prior to toileting, Performs perineal hygiene, Adjust clothing after toileting Toileting Assistive Devices: Grab bar or rail  Toileting assist Assist level: Two helpers   Transfers Chair/bed transfer   Chair/bed transfer [place]: Stand  pivot Chair/bed transfer assist level: Touching or steadying assistance (Pt > 75%) Chair/bed transfer assistive device: Armrests, Walker Mechanical lift: Ec[employer]     Max distance: 100 ft Assist level: Moderate assist (Pt 50 - 74%)   Wheelchair   Type: Manual Max wheelchair distance: 29ft Assist Level: Moderate assistance (Pt 50 - 74%)  Cognition Comprehension Comprehension assist level: Understands basic 25 - 49% of the time/ requires cueing 50 - 75% of the time  Expression Expression assist level: Expresses basic 25 - 49% of the time/requires cueing 50 - 75% of the time. Uses single words/gestures.  Social Interaction Social Interaction assist level: Interacts appropriately 25 - 49% of time - Needs frequent redirection.  Problem Solving Problem solving assist level: Solves basic 25 - 49% of the time - needs direction more than half the time to initiate, plan or complete simple activities  Memory Memory assist level: Recognizes or recalls 25 - 49% of the time/requires cueing 50 - 75% of the time    Medical Problem List and Plan: 1.  Right lean, left-sided weakness,dysphagia, cognitive deficits and difficulty standing secondary to right MCA infarct with GI bleed.  Continue CIR PT, OT- per son pt does better with cognition "once he steps out of hospital"  Allow grounds pass with family if pt is alert and not agitated 2.  DVT Prophylaxis/Anticoagulation: Mechanical:  Antiembolism stockings, knee (TED hose) Bilateral lower extremities 3. Pain Management: tylenol prn 4. Mood: LCSW to follow for evaluation and support as needed.  5. Neuropsych: This patient is not capable of making decisions on his own behalf. 6. Skin/Wound Care: routine pressure relief measures.  7. Fluids/Electrolytes/Nutrition: Monitor I/O.    -intake fair to good 8. ABLA due to duodenal ulcer/GIB:   Monitor H/H serial checks.   Increased protonix to 40 mg bid.  Hemoglobin 11.5 8/15 9.  Chronic  A fib: Monitor HR bid. No anticoagulation for now. No medications due to hypotension.  10. Delirium/sundowning:ongoing issue add low dose mirtazepine, change restoril to prn  -Continue Depakote tid    Seroquel DC'd on 8/8 due to oversedation, trazodone ineffective, restoril started on 8/9---overal sleep patterns improved but still inconsistent, some sundowning , Seroquel restarted - given at 2am sedated this am will d/c seroquel as even 12.5mg  dose results in "hangover" effect  Spoke to son- will give remeron at 8 pm  -  depakote level subtherapeutic--maintain same dose- no seizure  - 11. Constipation  Increased senna to bid.  Had bm 8/11   12. Prediabetes  Reasonable control 13. Hypoalbuminemia  Supplement initiated on 8/6, po intake improved 14. Blood pressure  -reasonable control 8/16  LOS (Days) 13 A FACE TO FACE EVALUATION WAS PERFORMED  Charlett Blake 12/17/2017 9:08 AM

## 2017-12-18 ENCOUNTER — Inpatient Hospital Stay (HOSPITAL_COMMUNITY): Payer: Medicare Other | Admitting: Physical Therapy

## 2017-12-18 ENCOUNTER — Inpatient Hospital Stay (HOSPITAL_COMMUNITY): Payer: Medicare Other | Admitting: Occupational Therapy

## 2017-12-18 ENCOUNTER — Inpatient Hospital Stay (HOSPITAL_COMMUNITY): Payer: Medicare Other

## 2017-12-18 DIAGNOSIS — G479 Sleep disorder, unspecified: Secondary | ICD-10-CM

## 2017-12-18 LAB — CBC
HCT: 41.1 % (ref 39.0–52.0)
Hemoglobin: 12.8 g/dL — ABNORMAL LOW (ref 13.0–17.0)
MCH: 29.6 pg (ref 26.0–34.0)
MCHC: 31.1 g/dL (ref 30.0–36.0)
MCV: 94.9 fL (ref 78.0–100.0)
PLATELETS: 233 10*3/uL (ref 150–400)
RBC: 4.33 MIL/uL (ref 4.22–5.81)
RDW: 13.9 % (ref 11.5–15.5)
WBC: 11.6 10*3/uL — AB (ref 4.0–10.5)

## 2017-12-18 LAB — BASIC METABOLIC PANEL
ANION GAP: 7 (ref 5–15)
BUN: 21 mg/dL (ref 8–23)
CHLORIDE: 106 mmol/L (ref 98–111)
CO2: 27 mmol/L (ref 22–32)
Calcium: 9.2 mg/dL (ref 8.9–10.3)
Creatinine, Ser: 0.88 mg/dL (ref 0.61–1.24)
GFR calc non Af Amer: >60 mL/min (ref >60)
Glucose, Bld: 107 mg/dL — ABNORMAL HIGH (ref 70–99)
Potassium: 4.2 mmol/L (ref 3.5–5.1)
SODIUM: 140 mmol/L (ref 135–145)

## 2017-12-18 MED ORDER — AMANTADINE HCL 100 MG PO CAPS: 100.0000 mg | ORAL_CAPSULE | Freq: Two times a day (BID) | ORAL | Status: DC

## 2017-12-18 MED ORDER — SODIUM CHLORIDE 0.9 % IV SOLN
INTRAVENOUS | Status: DC
  Administered 2017-12-18: 15:00:00 via INTRAVENOUS

## 2017-12-18 MED ORDER — DIVALPROEX SODIUM 125 MG PO CSDR
250.0000 mg | DELAYED_RELEASE_CAPSULE | Freq: Two times a day (BID) | ORAL | Status: DC
  Administered 2017-12-18 – 2017-12-20 (×5): 250 mg via ORAL
  Filled 2017-12-18 (×6): qty 2

## 2017-12-18 NOTE — Progress Notes (Signed)
Speech Language Pathology Daily Session Note  Patient Details  Name: Dakota [name] MRN: [id number] Date of Birth: 02-04-1929  Today's Date: 12/18/2017 SLP Individual Time: 0802-0850 SLP Individual Time Calculation (min): 48 min  Short Term Goals: Week 2: SLP Short Term Goal 1 (Week 2): Pt will consume dys 1 textures and nectar thick liquids via cup with mod assist multimodal cues for use of compensatory swallow strategies (small bite, slow rate).  SLP Short Term Goal 2 (Week 2): Pt will complete basic familiar problem solving tasks related to ADLs with Mod A cues.  SLP Short Term Goal 3 (Week 2): Pt will sustain attention to basic, familiar tasks for ~10 minute intervals with mod assist multimodal cues.  SLP Short Term Goal 4 (Week 2): Pt will slow rate and increase vocal intensity to achieve ~50%  intelligibility at the phrase level with mod assist.   SLP Short Term Goal 5 (Week 2): Pt will locate items to the left of midline during basic familiar tasks in ~50% of opportunities with max assist multimodal cues.  SLP Short Term Goal 6 (Week 2): Pt will consume thin liquids via TSP with min overt s/s of aspiration and Mod A cues for use of compensatory swallow strategies.   Skilled Therapeutic Interventions:Skilled ST services focused on cognitive skills. Pt was asleep upon entering room, with doctor present, doctor attempted sternal rub, although verbally responsive unable to keep eyes open for longer than 1 second. SLP attempted to arouse pt with repositioning in bed, thermal stimulation and sternal rubs, however demonstrated similar results above. Pt attempted communication in repsonse to verbal questions, however unintelligible with limited movement of articulators. SLP communicated with nurse pertaining to medication impacting reduced attention, confirmed she will follow up with doctor. Pt's son entered room, visibly upset that pt was unable to be aroused again, likely due to medication.SLP  provide education to pt's son about limited participation in theraptic service leading to poor prognosis. Nursing attempted to given pt NTL to increase arousal, SLP provided education about to high risk of aspiration and stopped administation. Pt was left in room with call bell within reach, bed alarm set and son in room. Therapeutic time was missed due to pt fatigue. Recommend to continue skilled ST services.      Function:  Eating Eating                 Cognition Comprehension Comprehension assist level: Understands basic less than 25% of the time/ requires cueing >75% of the time  Expression   Expression assist level: Expresses basis less than 25% of the time/requires cueing >75% of the time.  Social Interaction Social Interaction assist level: Interacts appropriately less than 25% of the time. May be withdrawn or combative.  Problem Solving Problem solving assist level: Solves basic less than 25% of the time - needs direction nearly all the time or does not effectively solve problems and may need a restraint for safety  Memory Memory assist level: Recognizes or recalls less than 25% of the time/requires cueing greater than 75% of the time    Pain Pain Assessment Pain Scale: Faces Pain Score: 0-No pain Faces Pain Scale: Hurts little more Pain Type: Chronic pain Pain Location: Back Pain Descriptors / Indicators: Aching;Discomfort Pain Onset: With Activity Pain Intervention(s): Repositioned;Emotional support  Therapy/Group: Individual Therapy  Simar Pothier  [facility] 12/18/2017, 3:36 PM

## 2017-12-18 NOTE — Progress Notes (Signed)
Physical Therapy Session Note  Patient Details  Name: Dakota [name] MRN: [id number] Date of Birth: 10-Jan-1929  Today's Date: 12/18/2017 PT Individual Time: 1030-1040 PT Individual Time Calculation (min): 10 min   Short Term Goals: Week 2:  PT Short Term Goal 1 (Week 2): Patient to perform rolling and sit to supine/supine to sit transfers with ModA PT Short Term Goal 2 (Week 2): Patient to be able to complete stand-pivot transfers with MinA  PT Short Term Goal 3 (Week 2): Patient to be able to ambulate at least 1ft with ModA of 1 and LRAD  PT Short Term Goal 4 (Week 2): Family to be educated and able to demonstrate basic bed mobility and sit to stand transfer mechanics with patient and close S from PT staff   Skilled Therapeutic Interventions/Progress Updates:    PT entered room and pt asleep, unable to be aroused by voice and light touch.  Attempted cold cloth with pt still unable to be woken.  Pt's son present who states "It's not going to happen, he's too asleep".  Pt's son upset about medication causing sleep and pt not being able to participate.  PT educated son on ongoing therapy goals and importance of family ed if son wants to take pt home at this level.  RN and social work aware.  Therapy Documentation Precautions:  Precautions Precautions: Fall Precaution Comments: right gaze, left hemiparesis, left lean /pushing Restrictions Weight Bearing Restrictions: No General: PT Amount of Missed Time (min): 20 Minutes PT Missed Treatment Reason: Patient fatigue Pain:  no signs/symptoms of pain   Therapy/Group: Individual Therapy  DONAWERTH,KAREN 12/18/2017, 10:51 AM

## 2017-12-18 NOTE — Progress Notes (Signed)
Physical Therapy Session Note  Patient Details  Name: Dakota [name] MRN: [id number] Date of Birth: 12-08-1928  Today's Date: 12/18/2017 PT Individual Time: 1430-1540 PT Individual Time Calculation (min): 70 min   Short Term Goals:  Week 2:  PT Short Term Goal 1 (Week 2): Patient to perform rolling and sit to supine/supine to sit transfers with ModA PT Short Term Goal 2 (Week 2): Patient to be able to complete stand-pivot transfers with MinA  PT Short Term Goal 3 (Week 2): Patient to be able to ambulate at least 12ft with ModA of 1 and LRAD  PT Short Term Goal 4 (Week 2): Family to be educated and able to demonstrate basic bed mobility and sit to stand transfer mechanics with patient and close S from PT staff       Skilled Therapeutic Interventions/Progress Updates:  Pt asleep but eventually awakened with name being called, cold wash cloth to face.  Son Dakota [name] present. IV team placing IV.  Supine> sit with max assist.  Squat pivot to R with pt's R hand placed far to R on armrest to prevent him pushing with it.    Therapeutic activity in sitting using small PVC pipes to assemble/dissasemble shape as per picture.  Pt used R hand primarily to handle pieces, but occasionally spontaneously used L hand as well. Hand over hand assist needed to grasp with L hand for bil hand task.  Pt perseverated motions with L hand after releasing pipes.   Neuromuscular re-education via multimodal cues for L attention, using L hand, upright posture (pt leans heavily to L).    Sit> stand to grocery cart with max assist to prevent pushing unsafely to L with R hand.  Gait training with grocery cart , wc following, briefly on level tile.  Pt tended to push cart unsafely too far ahead of his feet, and had difficulty progressing L foot.   Pt left resting in w/c with seat belt alarm applied and all needs within reach. Son Dakota [name] present.     Therapy Documentation Precautions:  Precautions Precautions:  Fall Precaution Comments: right gaze, left hemiparesis, left lean /pushing Restrictions Weight Bearing Restrictions: No General: PT Amount of Missed Time (min): 5 Minutes PT Missed Treatment Reason: Other (Comment)(IV team) Pain: Pain Assessment Pain Score: 0-No pain     See Function Navigator for Current Functional Status.   Therapy/Group: Individual Therapy  Raela Bohl 12/18/2017, 4:55 PM

## 2017-12-18 NOTE — Progress Notes (Signed)
[place] PHYSICAL MEDICINE & REHABILITATION     PROGRESS NOTE  Subjective/Complaints:  Patient seen lying in bed this morning. Patient had some agitation overnight and was given Seroquel late. SLP present, discuss cognitive status. Discussed mentation with case manager and PA as well.  ROS: Limited due to cognition    Objective: Vital Signs: Blood pressure 126/72, pulse (!) 110, temperature 99 F (37.2 C), temperature source Oral, resp. rate 18, height 5\' 7"  (1.702 m), weight 78.4 kg, SpO2 97 %. No results found. No results for input(s): WBC, HGB, HCT, PLT in the last 72 hours. No results for input(s): NA, K, CL, GLUCOSE, BUN, CREATININE, CALCIUM in the last 72 hours.  Invalid input(s): CO CBG (last 3)  No results for input(s): GLUCAP in the last 72 hours.  Wt Readings from Last 3 Encounters:  12/18/17 78.4 kg  12/04/17 78.1 kg  11/21/17 81.7 kg    Physical Exam:  BP 126/72 (BP Location: Left Arm)   Pulse (!) 110   Temp 99 F (37.2 C) (Oral)   Resp 18   Ht 5\' 7"  (1.702 m)   Wt 78.4 kg   SpO2 97%   BMI 27.08 kg/m  Constitutional: No distress . Vital signs reviewed. HENT: Normocephalic.  Atraumatic. Eyes: EOMI. No discharge. Cardiovascular: Irregularly irregular. No JVD. Respiratory: CTA Bilaterally. Normal effort. GI: BS +. Non-distended. Musc: No edema or tenderness in extremities. Neurological: HOH Very Somnolent Dysarthria Motor:  Not participating in MMT today Left inattention present Skin: Skin is warm and dry.  Psychiatric: alert   Assessment/Plan: 1. Functional deficits secondary to right MCA infarct with GI bleed which require 3+ hours per day of interdisciplinary therapy in a comprehensive inpatient rehab setting. Physiatrist is providing close team supervision and 24 hour management of active medical problems listed below. Physiatrist and rehab team continue to assess barriers to discharge/monitor patient progress toward functional and medical  goals.  Function:  Bathing Bathing position   Position: Sitting EOB  Bathing parts Body parts bathed by patient: Right arm, Left arm, Chest, Abdomen, Right upper leg, Left upper leg, Left lower leg, Right lower leg, Front perineal area, Buttocks Body parts bathed by helper: Back  Bathing assist Assist Level: Touching or steadying assistance(Pt > 75%)      Upper Body Dressing/Undressing Upper body dressing   What is the patient wearing?: Pull over shirt/dress     Pull over shirt/dress - Perfomed by patient: Thread/unthread left sleeve, Put head through opening, Pull shirt over trunk Pull over shirt/dress - Perfomed by helper: Thread/unthread right sleeve        Upper body assist Assist Level: Touching or steadying assistance(Pt > 75%)      Lower Body Dressing/Undressing Lower body dressing   What is the patient wearing?: Pants, Shoes, [employer]- Performed by patient: Thread/unthread right pants leg, Thread/unthread left pants leg, Pull pants up/down Pants- Performed by helper: Pull pants up/down Non-skid slipper socks- Performed by patient: Don/doff right sock, Don/doff left sock Non-skid slipper socks- Performed by helper: Don/doff right sock, Don/doff left sock     Shoes - Performed by patient: Don/doff right shoe Shoes - Performed by helper: Don/doff left shoe, Fasten right, Fasten left       TED Hose - Performed by helper: Don/doff right TED hose, Don/doff left TED hose  Lower body assist Assist for lower body dressing: Touching or steadying assistance (Pt > 75%)      Toileting Toileting Toileting activity did  not occur: No continent bowel/bladder event Toileting steps completed by patient: Adjust clothing prior to toileting Toileting steps completed by helper: Adjust clothing prior to toileting, Performs perineal hygiene, Adjust clothing after toileting Toileting Assistive Devices: Grab bar or rail  Toileting assist Assist level: Two helpers    Transfers Chair/bed transfer   Chair/bed transfer [place]: Stand pivot Chair/bed transfer assist level: Touching or steadying assistance (Pt > 75%) Chair/bed transfer assistive device: Armrests, Walker Mechanical lift: [employer]     Max distance: 100 ft Assist level: Moderate assist (Pt 50 - 74%)   Wheelchair   Type: Manual Max wheelchair distance: 72ft Assist Level: Moderate assistance (Pt 50 - 74%)  Cognition Comprehension Comprehension assist level: Understands basic 25 - 49% of the time/ requires cueing 50 - 75% of the time  Expression Expression assist level: Expresses basic 25 - 49% of the time/requires cueing 50 - 75% of the time. Uses single words/gestures.  Social Interaction Social Interaction assist level: Interacts appropriately 25 - 49% of time - Needs frequent redirection.  Problem Solving Problem solving assist level: Solves basic 25 - 49% of the time - needs direction more than half the time to initiate, plan or complete simple activities  Memory Memory assist level: Recognizes or recalls 25 - 49% of the time/requires cueing 50 - 75% of the time    Medical Problem List and Plan: 1.  Right lean, left-sided weakness,dysphagia, cognitive deficits and difficulty standing secondary to right MCA infarct with GI bleed.  Continue CIR - per son pt does better with cognition "once he steps out of hospital", allow grounds pass with family if pt is alert and not agitated 2.  DVT Prophylaxis/Anticoagulation: Mechanical:  Antiembolism stockings, knee (TED hose) Bilateral lower extremities 3. Pain Management: tylenol prn 4. Mood: LCSW to follow for evaluation and support as needed.  5. Neuropsych: This patient is not capable of making decisions on his own behalf.  Will consider trial Amantadine 100 started on 8/19 6. Skin/Wound Care: routine pressure relief measures.  7. Fluids/Electrolytes/Nutrition: Monitor I/O.    -intake fair to good 8. ABLA due to  duodenal ulcer/GIB:   Monitor H/H serial checks.   Increased protonix to 40 mg bid.  Hemoglobin 11.5 on 8/15  Labs pending 9.  Chronic A fib: Monitor HR bid. No anticoagulation for now. No medications due to hypotension.  10. Delirium/sundowning:ongoing issue added low dose mirtazepine, changed restoril to prn  Will consider further changes  -Continue Depakote tid    Seroquel DC'd on 8/8 due to oversedation, trazodone ineffective, restoril started on 8/9---overal sleep patterns improved but still inconsistent, some sundowning,   Spoke to son- will give remeron at 8 pm  Depakote level subtherapeutic--maintain same dose- no seizure, will consider weaning 11. Constipation  Increased senna to bid. 12. Prediabetes  Reasonable control 13. Hypoalbuminemia  Supplement initiated on 8/6, po intake improved 14. Blood pressure  -reasonable control 8/16  >35 spent in total with >30 minutes in counseling son regarding sleep, interventions, and activiity  LOS (Days) 14 A FACE TO FACE EVALUATION WAS PERFORMED  Ankit [name] 12/18/2017 11:16 AM

## 2017-12-18 NOTE — Progress Notes (Signed)
Son concerned about multiple medication changes causing SE. Discussed patient's issues with delirium when hospitalized compounded by stroke as well as medication changes this weekend. Son feels that Seroquel is most effective but discussed sedating SE as well as SE of Remeron. Also discussed d/c of Restoril and decrease in Depakote to decrease sedating SE.   Labs WNL but intake has been poor this WE due to sedation. CBC with elevation in WBC. Will check UA/UCS as well as CXR to rule out infection. Will start gentle hydration.

## 2017-12-18 NOTE — Progress Notes (Signed)
Occupational Therapy Session Note  Patient Details  Name: Dakota [name] MRN: [id number] Date of Birth: 1928-10-31  Today's Date: 12/18/2017 OT Individual Time: 1127-1209 OT Individual Time Calculation (min): 42 min    Short Term Goals: Week 2:  OT Short Term Goal 1 (Week 2): Pt completed UB selfcare sitting with min assist.  OT Short Term Goal 2 (Week 2): Pt will complete LB bathing min assist sit to stand.   OT Short Term Goal 3 (Week 2): Pt will complete LB dressing sit to stand with mod assist. OT Short Term Goal 4 (Week 2): Pt will complete toilet transfers and shower transfers stand pivot with min assist.   OT Short Term Goal 5 (Week 2): Pt will locate grooming and bathing items left of midline with no more and min instructional cueing.  Skilled Therapeutic Interventions/Progress Updates:    Pt asleep when therapist entered room.  Discussion with pt's son and he stated that the pt has not been getting his sleeping medications on time and it is causing him to be more sleepy during the day.  Pt had missed his first two sessions of therapy with SLP and PT as he would not wake up.  Therapist provided total assist for supine to sit with pt still keeping eyes closed.  Max assist for initial stand pivot transfer to the wheelchair from bed with therapist provided max assist to move the LLE as well.  Once in the shower he needed max assist again to transfer to the tub bench.  Pt with flexed posture and heavy pushing to the left in sitting.  He needed overall max assist for bathing,  He would continue to flex his head and trunk forward as well as to the left.  Max assist to re-correct posture as well as to maintain sitting balance.  He perseverated on washing his left upper leg majority of the time and needed max demonstrational cueing to continue to wash his right upper arm.  Still with his eyes closed majority of the time.  Therapist had to assist with all rinsing and 75% of drying off secondary to  attention level.  Mod assist for stand pivot to the wheelchair from the tub bench with pt demonstrating improved movement in the LLE.  Max assist for all dressing secondary to pt not being able to keep his eyes open or keep his focused attention on task.  Returned to bed at end of session with pt's son present and voicing displeasure of his current medicine situation stating "i'll just take him home tomorrow if they can't get his meds right, and he can sleep there."  Call  Button in reach and bed alarm in place.    Therapy Documentation Precautions:  Precautions Precautions: Fall Precaution Comments: right gaze, left hemiparesis, left lean /pushing Restrictions Weight Bearing Restrictions: No  Pain: Pain Assessment Pain Scale: Faces Faces Pain Scale: Hurts little more Pain Type: Chronic pain Pain Location: Back Pain Descriptors / Indicators: Aching;Discomfort Pain Onset: With Activity Pain Intervention(s): Repositioned;Emotional support ADL: See Function Navigator for Current Functional Status.   Therapy/Group: Individual Therapy  Vanderbilt Ranieri OTR/L 12/18/2017, 12:46 PM

## 2017-12-18 NOTE — Progress Notes (Signed)
Pt was sedated this am, responsive to sternal rib by opening eyes. Administered ice to back of neck/earlobes. Educated son of pt medications. Son expressed he wanted his father to take seroquel twice a day as on the other unit. Writer continue to educate son. Son noted, "I want dr. Posey Pronto to change order or else I will take my daddy home tomorrow." Writer encourage the son to at least give the Dr. Posey Pronto to the opportunity to find the medication that is appropriate for him with his condition and age.   Writer notified Dr. Posey Pronto of son request. Dr. Posey Pronto noted, he would look at patient and make changes. Writer also, PA Love about the patient's condition and son's concerns. She visited the patient and son.   Son refused IV fluids. Educated the son and inform PA of son discussion. Later, son agreed to administer IV fluids. Writer continued to educate son throughout the shift.

## 2017-12-19 ENCOUNTER — Inpatient Hospital Stay (HOSPITAL_COMMUNITY): Payer: Medicare Other | Admitting: Occupational Therapy

## 2017-12-19 ENCOUNTER — Inpatient Hospital Stay (HOSPITAL_COMMUNITY): Payer: Medicare Other

## 2017-12-19 ENCOUNTER — Inpatient Hospital Stay (HOSPITAL_COMMUNITY): Payer: Medicare Other | Admitting: Physical Therapy

## 2017-12-19 ENCOUNTER — Inpatient Hospital Stay (HOSPITAL_COMMUNITY): Payer: Medicare Other | Admitting: Speech Pathology

## 2017-12-19 DIAGNOSIS — I69391 Dysphagia following cerebral infarction: Secondary | ICD-10-CM

## 2017-12-19 DIAGNOSIS — D72829 Elevated white blood cell count, unspecified: Secondary | ICD-10-CM

## 2017-12-19 LAB — URINALYSIS, ROUTINE W REFLEX MICROSCOPIC
BILIRUBIN URINE: NEGATIVE
Glucose, UA: NEGATIVE mg/dL
HGB URINE DIPSTICK: NEGATIVE
Ketones, ur: NEGATIVE mg/dL
Leukocytes, UA: NEGATIVE
Nitrite: NEGATIVE
PROTEIN: NEGATIVE mg/dL
Specific Gravity, Urine: 1.019 (ref 1.005–1.030)
pH: 7 (ref 5.0–8.0)

## 2017-12-19 MED ORDER — NON FORMULARY: 1.5000 mg | Freq: Every day | Status: DC

## 2017-12-19 MED ORDER — MELATONIN 3 MG PO TABS
1.5000 mg | ORAL_TABLET | Freq: Every day | ORAL | Status: DC
  Administered 2017-12-19 – 2017-12-20 (×2): 1.5 mg via ORAL
  Filled 2017-12-19 (×3): qty 0.5

## 2017-12-19 MED ORDER — WHITE PETROLATUM EX OINT: TOPICAL_OINTMENT | CUTANEOUS | Status: DC | PRN

## 2017-12-19 MED ORDER — MELATONIN 3 MG PO TABS: 1.5000 mg | ORAL_TABLET | Freq: Every day | ORAL | Status: DC

## 2017-12-19 NOTE — Progress Notes (Signed)
[facility] & [facility]ILITATION     PROGRESS NOTE  Subjective/Complaints:  Pt seen laying in bed this AM.  He slept fairly per sleep chart overnight, however, slept a significant portion of the day yesterday.  Patient remains somnolent, workup ordered.   ROS: Limited due to cognition.   Objective: Vital Signs: Blood pressure 117/83, pulse (!) 111, temperature 97.8 F (36.6 C), temperature source Oral, resp. rate (!) 21, height 5\' 7"  (1.702 m), weight 78.4 kg, SpO2 96 %. Dg Chest Port 1 View  Result Date: 12/18/2017 CLINICAL DATA:  Dysphagia EXAM: PORTABLE CHEST 1 VIEW COMPARISON:  11/29/2017, 11/28/2017, 11/21/2017 FINDINGS: Left-sided single E pacing device. No acute opacity or pleural effusion. Normal cardiomediastinal silhouette. No pneumothorax. IMPRESSION: No active disease. Electronically Signed   By: Donavan Foil M.D.   On: 12/18/2017 18:26   Recent Labs    12/18/17 1113  WBC 11.6*  HGB 12.8*  HCT 41.1  PLT 233   Recent Labs    12/18/17 1113  NA 140  K 4.2  CL 106  GLUCOSE 107*  BUN 21  CREATININE 0.88  CALCIUM 9.2   CBG (last 3)  No results for input(s): GLUCAP in the last 72 hours.  Wt Readings from Last 3 Encounters:  12/18/17 78.4 kg  12/04/17 78.1 kg  11/21/17 81.7 kg    Physical Exam:  BP 117/83 (BP Location: Right Arm)   Pulse (!) 111   Temp 97.8 F (36.6 C) (Oral)   Resp (!) 21   Ht 5\' 7"  (1.702 m)   Wt 78.4 kg   SpO2 96%   BMI 27.08 kg/m  Constitutional: No distress . Vital signs reviewed. HENT: Normocephalic.  Atraumatic. Eyes: EOMI. No discharge. Cardiovascular: Irregularly irregular. No JVD. Respiratory: CTA bilaterally. Normal effort. GI: BS +. Non-distended. Musc: No edema or tenderness in extremities. Neurological: HOH Somnolent Severe dysarthria Motor:  Not participating in MMT today Left inattention persistent Skin: Skin is warm and dry.  Psychiatric: alert   Assessment/Plan: 1. Functional deficits  secondary to right MCA infarct with GI bleed which require 3+ hours per day of interdisciplinary therapy in a comprehensive inpatient rehab setting. Physiatrist is providing close team supervision and 24 hour management of active medical problems listed below. Physiatrist and rehab team continue to assess barriers to discharge/monitor patient progress toward functional and medical goals.  Function:  Bathing Bathing position   Position: Shower  Bathing parts Body parts bathed by patient: Left arm, Right upper leg Body parts bathed by helper: Left upper leg, Buttocks, Front perineal area, Abdomen, Chest, Right arm, Right lower leg, Left lower leg, Back  Bathing assist Assist Level: Touching or steadying assistance(Pt > 75%)      Upper Body Dressing/Undressing Upper body dressing   What is the patient wearing?: Pull over shirt/dress     Pull over shirt/dress - Perfomed by patient: Thread/unthread right sleeve Pull over shirt/dress - Perfomed by helper: Thread/unthread left sleeve, Put head through opening, Pull shirt over trunk        Upper body assist Assist Level: Touching or steadying assistance(Pt > 75%)      Lower Body Dressing/Undressing Lower body dressing   What is the patient wearing?: Pants, Shoes, [employer]- Performed by patient: Thread/unthread right pants leg, Thread/unthread left pants leg, Pull pants up/down Pants- Performed by helper: Thread/unthread right pants leg, Thread/unthread left pants leg, Pull pants up/down Non-skid slipper socks- Performed by patient: Don/doff right sock, Don/doff left  sock Non-skid slipper socks- Performed by helper: Don/doff right sock, Don/doff left sock     Shoes - Performed by patient: Don/doff right shoe Shoes - Performed by helper: Don/doff left shoe, Fasten right, Fasten left       TED Hose - Performed by helper: Don/doff right TED hose, Don/doff left TED hose  Lower body assist Assist for lower body dressing:  Touching or steadying assistance (Pt > 75%)      Toileting Toileting Toileting activity did not occur: No continent bowel/bladder event Toileting steps completed by patient: Adjust clothing prior to toileting Toileting steps completed by helper: Adjust clothing prior to toileting, Performs perineal hygiene, Adjust clothing after toileting Toileting Assistive Devices: Grab bar or rail  Toileting assist Assist level: Two helpers   Transfers Chair/bed transfer   Chair/bed transfer method: Squat pivot Chair/bed transfer assist level: Maximal assist (Pt 25 - 49%/lift and lower) Chair/bed transfer assistive device: Armrests Mechanical lift: Ecologist     Max distance: 10 Assist level: Maximal assist (Pt 25 - 49%)   Wheelchair   Type: Manual Max wheelchair distance: 105ft Assist Level: Moderate assistance (Pt 50 - 74%)  Cognition Comprehension Comprehension assist level: Understands basic 25 - 49% of the time/ requires cueing 50 - 75% of the time  Expression Expression assist level: Expresses basic 25 - 49% of the time/requires cueing 50 - 75% of the time. Uses single words/gestures.  Social Interaction Social Interaction assist level: Interacts appropriately 25 - 49% of time - Needs frequent redirection.  Problem Solving Problem solving assist level: Solves basic 25 - 49% of the time - needs direction more than half the time to initiate, plan or complete simple activities  Memory Memory assist level: Recognizes or recalls 25 - 49% of the time/requires cueing 50 - 75% of the time    Medical Problem List and Plan: 1.  Right lean, left-sided weakness,dysphagia, cognitive deficits and difficulty standing secondary to right MCA infarct with GI bleed.  Continue CIR - per son pt does better with cognition "once he steps out of hospital", allow grounds pass with family if pt is alert and not agitated 2.  DVT Prophylaxis/Anticoagulation: Mechanical:  Antiembolism stockings,  knee (TED hose) Bilateral lower extremities 3. Pain Management: tylenol prn 4. Mood: LCSW to follow for evaluation and support as needed.  5. Neuropsych: This patient is not capable of making decisions on his own behalf.  Will consider trial Amantadine 100 started on 8/19 6. Skin/Wound Care: routine pressure relief measures.  7. Fluids/Electrolytes/Nutrition: Monitor I/O.  8. ABLA due to duodenal ulcer/GIB:   Monitor H/H serial checks.   Increased protonix to 40 mg bid.  Hemoglobin 12.8 on 8/19 9.  Chronic A fib: Monitor HR bid. No anticoagulation for now. No medications due to hypotension.  10. Delirium/sundowning:ongoing issue added mirtazepine, changed restoril to prn, DC'd on 8/19  Will consider further changes  -Continue Depakote tid    Seroquel DC'd on 8/8 due to oversedation, trazodone ineffective, restoril ?ineffective  Will give remeron at 8 pm  Depakote level subtherapeutic--maintain same dose- no seizure, decreased on 8/19  Melatonin started on 8/20 11. Constipation  Increased senna to bid. 12. Prediabetes  Reasonable control 13. Hypoalbuminemia  Supplement initiated on 8/6, po intake improved 14. Blood pressure  -reasonable control 8/16 15. Leukocytosis  WBCs 11.6 on 8/19  Afebrile  UA negative, urine culture pending  Chest x-ray reviewed, unremarkable for acute infectious process    LOS (Days) 15 A  FACE TO FACE EVALUATION WAS PERFORMED  Stephen Baruch [name] 12/19/2017 9:28 AM

## 2017-12-19 NOTE — Progress Notes (Signed)
Occupational Therapy Session Note  Patient Details  Name: Dakota [name] MRN: [id number] Date of Birth: [birth year]  Today's Date: 12/19/2017 OT Individual Time: 1003-1103 OT Individual Time Calculation (min): 60 min    Short Term Goals: Week 2:  OT Short Term Goal 1 (Week 2): Pt completed UB selfcare sitting with min assist.  OT Short Term Goal 2 (Week 2): Pt will complete LB bathing min assist sit to stand.   OT Short Term Goal 3 (Week 2): Pt will complete LB dressing sit to stand with mod assist. OT Short Term Goal 4 (Week 2): Pt will complete toilet transfers and shower transfers stand pivot with min assist.   OT Short Term Goal 5 (Week 2): Pt will locate grooming and bathing items left of midline with no more and min instructional cueing.  Skilled Therapeutic Interventions/Progress Updates:    Pt completed bathing and dressing sitting EOB in order to work on dynamic sitting balance.  Max instructional cueing to sequence bathing with overall min assist for sitting balance.  He still demonstrates increased pushing to the left but less severe this session compared to yesterday.  Still with motor planning and sensory deficits resulting in occasion dropping of the washcloth with the left hand.  At times however, he does not recognize errors with bathing or dressing and continues to complete the task even though he isn't holding the washcloth, or he has not placed his leg in the pants.  Max demonstrational cueing for all dressing.  Pt unable to donn pullover over the LUE without mod assist.  He also needed mod assist for donning pants over the left foot and for not placing the right foot in the same pants leg as the left side.  He was able to donn his right slip on shoe but needed max assist with the left.  Stood at the sink for over 7 mins with min assist for brushing his hair and teeth.  Max demonstrational cueing for sequencing and for scanning left of midline for locating the hot water control to  turn the water off.  Finished session with pt in bed with call button and phone in reach.  Pt's son present throughout session as well.    Therapy Documentation Precautions:  Precautions Precautions: Fall Precaution Comments: right gaze, left hemiparesis, left lean /pushing Restrictions Weight Bearing Restrictions: No  Pain: Pain Assessment Pain Scale: 0-10 Pain Score: 0-No pain ADL: See Function Navigator for Current Functional Status.   Therapy/Group: Individual Therapy  Marcene Laskowski OTR/L 12/19/2017, 12:30 PM

## 2017-12-19 NOTE — Progress Notes (Addendum)
Physical Therapy Weekly Progress Note  Patient Details  Name: Dakota [name] MRN: [id number] Date of Birth: 17-Jun-1928  Beginning of progress report period: December 12, 2017 End of progress report period: December 19, 2017  Today's Date: 12/19/2017 PT Individual Time: 1445-1530 PT Individual Time Calculation (min): 45 min   Patient has  Partially met 2 of 4 short term goals. Progress has been limited by level of alertness, as when patient is alert and involved in session he may require as little as MinA for mobility, however when lethargic he may require as much as MaxA for mobility and may have limited participation in therapy.   Patient continues to demonstrate the following deficits muscle weakness and muscle joint tightness, impaired timing and sequencing, abnormal tone, unbalanced muscle activation, decreased coordination and decreased motor planning, decreased attention to left, left side neglect and decreased motor planning and decreased sitting balance, decreased standing balance, decreased postural control, hemiplegia and decreased balance strategies and therefore will continue to benefit from skilled PT intervention to increase functional independence with mobility.  Patient not progressing toward long term goals.  See goal revision..  Plan of care revisions: as indicated in updates to LTGs- increased level of assist for mobility including WC navigation, stair navigation, and car transfers..  PT Short Term Goals Week 3:  PT Short Term Goal 1 (Week 3): Patient to be able to perform functional bed mobility with MinA on 2/3 attempts  PT Short Term Goal 2 (Week 3): Patient to be able to perform stand-pivot transfers with MinA on 2/3 attempts  PT Short Term Goal 3 (Week 3): Family to demonstrate correct mechanics for bed mobility and stand-pivot transfers  PT Short Term Goal 4 (Week 3): Family to be able to verbalize importance of pressure relief and positioning strategies for use at home    Skilled Therapeutic Interventions/Progress Updates:    Patient received up at edge of table in gym with rehab staff present, alert and able to participate in therapy today. Able to ambulate approximately 159f to Nustep machine from gym with ModAx2 and HHA assist, cues for upright posture, step length, and navigation provided. Able to exercise on Nustep for approximately 4.5 minutes with UE/LEs for reciprocal pattern and assist holding L hand on Nustep arm, performed visual activities to bring patient's attention/visual field to midline and reduce L inattention. Followed Nustep immediately with further gait training approximately 1074fback to gym again with ModAx2 and cues for upright posture and step length. Worked on dynamic activities involving cross midline reaching and weight shift to R to attempt to reduce L lean, however patient becomes focused and perseverates on items used in activity rather than participating in activity itself. Continued gait training again approximately 10046fefore patient stated that he needed to use restroom. Returned patient to room with totalA via WC [employer] performed stand pivot to toilet with MinAx2, HHA. He was left up on the toilet with family immediately present and agreeable to call nurse for assistance with transfer back to WC/bed.   Therapy Documentation Precautions:  Precautions Precautions: Fall Precaution Comments: right gaze, left hemiparesis, left lean /pushing Restrictions Weight Bearing Restrictions: No General:   Vital Signs: Therapy Vitals Temp: 98.6 F (37 C) Temp Source: Oral Pulse Rate: 100 Resp: 17 BP: 121/64 Patient Position (if appropriate): Lying Oxygen Therapy SpO2: 94 % O2 Device: Room Air Pain: Pain Assessment Pain Scale: Faces Pain Score: 0-No pain     See Function Navigator for Current Functional Status.  Therapy/Group: Individual  Therapy  Deniece Ree PT, DPT, CBIS  Supplemental Physical Therapist [facility]    Pager [phone]

## 2017-12-19 NOTE — Progress Notes (Signed)
Speech Language Pathology Weekly Progress and Session Note  Patient Details  Name: Dakota [name] MRN: [id number] Date of Birth: [birth year]  Beginning of progress report period:  End of progress report period:   Today's Date: 12/19/2017 SLP Individual Time: 1100-1200 SLP Individual Time Calculation (min): 60 min  Short Term Goals: Week 2: SLP Short Term Goal 1 (Week 2): Pt will consume dys 1 textures and nectar thick liquids via cup with mod assist multimodal cues for use of compensatory swallow strategies (small bite, slow rate).  SLP Short Term Goal 1 - Progress (Week 2): Met SLP Short Term Goal 2 (Week 2): Pt will complete basic familiar problem solving tasks related to ADLs with Mod A cues.  SLP Short Term Goal 2 - Progress (Week 2): Met SLP Short Term Goal 3 (Week 2): Pt will sustain attention to basic, familiar tasks for ~10 minute intervals with mod assist multimodal cues.  SLP Short Term Goal 3 - Progress (Week 2): Progressing toward goal SLP Short Term Goal 4 (Week 2): Pt will slow rate and increase vocal intensity to achieve ~50%  intelligibility at the phrase level with mod assist.   SLP Short Term Goal 4 - Progress (Week 2): Met SLP Short Term Goal 5 (Week 2): Pt will locate items to the left of midline during basic familiar tasks in ~50% of opportunities with max assist multimodal cues.  SLP Short Term Goal 5 - Progress (Week 2): Met SLP Short Term Goal 6 (Week 2): Pt will consume thin liquids via TSP with min overt s/s of aspiration and Mod A cues for use of compensatory swallow strategies.  SLP Short Term Goal 6 - Progress (Week 2): Met    New Short Term Goals: Week 3: SLP Short Term Goal 1 (Week 3): Pt will consume therapeutic trials of thin liquids with minimal overt s/s of aspiration and min cues for use of swallowing strategies.  SLP Short Term Goal 2 (Week 3): Pt will complete basic familiar problem solving tasks related to ADLs with Min A cues.  SLP Short Term Goal 3  (Week 3): Pt will sustain attention to basic, familiar tasks for ~10 minute intervals with mod assist multimodal cues.  SLP Short Term Goal 4 (Week 3): Pt will slow rate and increase vocal intensity to achieve ~75%  intelligibility at the phrase level with mod assist.   SLP Short Term Goal 5 (Week 3): Pt will locate items to the left of midline during basic familiar tasks in ~50% of opportunities with mod assist multimodal cues.   Weekly Progress Updates:  Pt has had a slow week with a decline in function secondary to medication adjustments affecting sleep/wake cycle; however, today pt has demonstrated significant improvements in alertness and attention which impacted his functional problem solving, carryover, and swallowing safety during trials of thin liquids, see today's treatment note.  Pt remains on dys 1 textures and nectar thick liquids due to decreased arousal this week.  Pt would continue to benefit from skilled ST while inpatient in order to maximize functional independence and reduce burden of care prior to discharge.  Anticipate that pt will need 24/7 supervision at discharge in addition to [place] follow up at next level of care.     Intensity: Minumum of 1-2 x/day, 30 to 90 minutes Frequency: 3 to 5 out of 7 days Duration/Length of Stay: 21-24 days  Treatment/Interventions: Cognitive remediation/compensation;Cueing hierarchy;Dysphagia/aspiration precaution training;Functional tasks;Environmental controls;Internal/external aids;Patient/family education   Daily Session  Skilled Therapeutic Interventions: Pt  was seen for skilled ST targeting cognitive and dysphagia goals.  Pt was received upright in wheelchair, awake, alert, and agreeable to participate in [place].  Pt could problem solve oral care using toothbrush, toothpaste, and mouthwash with min-mod cues to initiate and complete task in a timely manner.   SLP facilitated the session with therapeutic trials of thin liquids to continue working  towards diet progression.  Pt demonstrated x1 delayed cough out of ~10 sips.  Pt demonstrated good awareness of the correlation between cough and intake of thin liquids, stating "That one didn't go down as good."   Pt reported he felt that the had to use the bathroom and was taken back to his room and transferred to commode via [place] lift.  Pt was unable to void or have a bowel movement.  RN made aware.  Pt was left in wheelchair with son at bedside.  Goals updated on this date to reflect current progress and plan of care.     Function:   Eating Eating   Modified Consistency Diet: Yes Eating Assist Level: Supervision or verbal cues;Set up assist for   Eating Set Up Assist For: Opening containers       Cognition Comprehension Comprehension assist level: Understands basic 25 - 49% of the time/ requires cueing 50 - 75% of the time  Expression   Expression assist level: Expresses basic 25 - 49% of the time/requires cueing 50 - 75% of the time. Uses single words/gestures.  Social Interaction Social Interaction assist level: Interacts appropriately 50 - 74% of the time - May be physically or verbally inappropriate.  Problem Solving Problem solving assist level: Solves basic 25 - 49% of the time - needs direction more than half the time to initiate, plan or complete simple activities  Memory Memory assist level: Recognizes or recalls 25 - 49% of the time/requires cueing 50 - 75% of the time   General    Pain Pain Assessment Pain Scale: Faces Pain Score: 0-No pain  Therapy/Group: Individual Therapy  Mann Skaggs, Selinda Orion 12/19/2017, 4:22 PM

## 2017-12-19 NOTE — Consult Note (Signed)
Patient had increased confusion during this shift. Several times during the night he tried getting OOB. Telemonitor in place for fall precautions

## 2017-12-19 NOTE — Progress Notes (Signed)
Occupational Therapy Session Note  Patient Details  Name: Dakota [name] MRN: [id number] Date of Birth: 01-31-1929  Today's Date: 12/19/2017 OT Individual Time: 5681-2751 OT Individual Time Calculation (min): 46 min    Short Term Goals: Week 2:  OT Short Term Goal 1 (Week 2): Pt completed UB selfcare sitting with min assist.  OT Short Term Goal 2 (Week 2): Pt will complete LB bathing min assist sit to stand.   OT Short Term Goal 3 (Week 2): Pt will complete LB dressing sit to stand with mod assist. OT Short Term Goal 4 (Week 2): Pt will complete toilet transfers and shower transfers stand pivot with min assist.   OT Short Term Goal 5 (Week 2): Pt will locate grooming and bathing items left of midline with no more and min instructional cueing.  Skilled Therapeutic Interventions/Progress Updates:    Pt completed supine to sit with mod assist to start session.  Once on the EOB therapist assisted with donning slip on shoes secondary to decreased time.  Pt then stood with mod assist and ambulated to the nurses station with total assist +2 (pt 65%).  Decreased clearing of the LLE and step length noted with increased cervical and trunk flexion as well as left head turn.  Therapist attempted to facilitate midline head posture as well as cervical extension.  He took one rest break at the nurses station before ambulating the rest of the way to the therapy gym.  Once in the gym had him work in standing on lateral weightshifts to the right pushing side to pick up cards with the RUE and then reach and scan visually across midline to the left to match them and place them on a vertical board.  He was able to complete this with min assist for balance and max questioning cueing to correctly match them.  In sitting had him complete same task but had him use the LUE only.  Mod instructional cueing with min assist needed.  Pt would drop 25% of them from the left hand secondary to decreased sensation and visual  attention on the hand.  Finished session with pt sitting on the mat and PT coming in to take over for next session.   Therapy Documentation Precautions:  Precautions Precautions: Fall Precaution Comments: right gaze, left hemiparesis, left lean /pushing Restrictions Weight Bearing Restrictions: No  Pain: Pain Assessment Pain Scale: Faces Pain Score: 0-No pain ADL: See Function Navigator for Current Functional Status.   Therapy/Group: Individual Therapy  Charmel Pronovost OTR/L 12/19/2017, 4:10 PM

## 2017-12-19 NOTE — Plan of Care (Signed)
  Problem: Consults Goal: RH STROKE PATIENT EDUCATION Description See Patient Education module for education specifics  Outcome: Progressing   Problem: RH BOWEL ELIMINATION Goal: RH STG MANAGE BOWEL WITH ASSISTANCE Description STG Manage Bowel with mod  Assistance.   Outcome: Progressing Goal: RH STG MANAGE BOWEL W/MEDICATION W/ASSISTANCE Description STG Manage Bowel with Medication with  [employer].  Outcome: Progressing   Problem: RH BLADDER ELIMINATION Goal: RH STG MANAGE BLADDER WITH ASSISTANCE Description STG Manage Bladder With  Mod Assistance   Outcome: Progressing   Problem: RH SKIN INTEGRITY Goal: RH STG SKIN FREE OF INFECTION/BREAKDOWN Description Patient and family will be able to verbalize understanding of preventing breakdown   Outcome: Progressing   Problem: RH SAFETY Goal: RH STG ADHERE TO SAFETY PRECAUTIONS W/ASSISTANCE/DEVICE Description STG Adhere to Safety Precautions With  Mod Assistance/Device.   Outcome: Progressing   Problem: RH PAIN MANAGEMENT Goal: RH STG PAIN MANAGED AT OR BELOW PT'S PAIN GOAL Description Less than 3   Outcome: Progressing

## 2017-12-20 ENCOUNTER — Inpatient Hospital Stay (HOSPITAL_COMMUNITY): Payer: Medicare Other | Admitting: Speech Pathology

## 2017-12-20 ENCOUNTER — Inpatient Hospital Stay (HOSPITAL_COMMUNITY): Payer: Medicare Other | Admitting: Physical Therapy

## 2017-12-20 ENCOUNTER — Inpatient Hospital Stay (HOSPITAL_COMMUNITY): Payer: Medicare Other | Admitting: Occupational Therapy

## 2017-12-20 LAB — URINE CULTURE: Culture: NO GROWTH

## 2017-12-20 NOTE — Patient Care Conference (Signed)
Inpatient RehabilitationTeam Conference and Plan of Care Update Date: 12/20/2017   Time: 1:48 PM    Patient Name: Dakota [name]      Medical Record Number: [id number]  Date of Birth: 1928/06/21 Sex: Male         Room/Bed: 4M11C/4M11C-01 Payor Info: Payor: MEDICARE / Plan: MEDICARE PART A AND B / Product Type: *No Product type* /    Admitting Diagnosis: R MCA  Admit Date/Time:  12/04/2017  3:53 PM Admission Comments: No comment available   Primary Diagnosis:  Acute ischemic right MCA stroke ([place]) Principal Problem: Acute ischemic right MCA stroke [facility])  Patient Active Problem List   Diagnosis Date Noted  . Dysphagia due to recent stroke   . Sleep disturbance   . Sundowning   . Labile blood pressure   . Slow transit constipation   . History of GI bleed   . Acute blood loss anemia   . Delirium   . GIB (gastrointestinal bleeding) 11/24/2017  . Hypotension 11/24/2017  . Hypovolemic shock ([place]) 11/24/2017  . Acute lower UTI   . Leucocytosis   . Hypoalbuminemia due to protein-calorie malnutrition ([place])   . Prediabetes   . Prerenal azotemia   . Subacute delirium 11/21/2017  . Dysphagia due to recent cerebrovascular accident 11/21/2017  . Chewing tobacco nicotine dependence 11/21/2017  . Leukocytosis 11/21/2017  . Acute ischemic right MCA stroke ([place]) 11/21/2017  . Cerebrovascular accident (CVA) due to thrombosis of right middle cerebral artery ([place])   . Acute deep vein thrombosis (DVT) of proximal vein of lower extremity ([name])   . AKI (acute kidney injury) ([place])   . Middle cerebral artery embolism, right s/p endovascular therapy 11/16/2017  . Caregiver stress 10/05/2015  . Hematuria 04/06/2015  . ARF (acute renal failure) ([place]) 07/18/2014  . Cardiogenic shock ([place]) 07/18/2014  . Atrial fibrillation with slow ventricular response ([place])   . NSTEMI (non-ST elevated myocardial infarction) ([place])   . CHB (complete heart block) - s/p MDT 1 lead PPM 07/17/2014  . Advance care planning  07/03/2014  . Sebaceous cyst 04/11/2013  . Medicare annual wellness visit, subsequent 10/28/2012  . RIB PAIN, RIGHT SIDED 01/02/2009  . CAD, NATIVE VESSEL 11/25/2008  . Unspecified vitamin D deficiency 11/24/2008  . RECENT RET DETACH PARTIAL W/SINGLE DEFECT 11/24/2008  . Essential hypertension, benign 07/21/2008  . HLD (hyperlipidemia) 05/22/2007  . Atrial fibrillation ([place]) 05/22/2007  . GERD 05/22/2007  . GASTRIC ULCER, ACUTE, HEMORRHAGE 05/22/2007  . Hyperglycemia 05/22/2007    Expected Discharge Date: Expected Discharge Date: 12/26/17  Team Members Present: Physician leading conference: Dr. Delice Lesch Social Worker Present: Lennart Pall, LCSW Nurse Present: Isla Pence, RN PT Present: Phylliss Bob, PTA;Caroline Lacinda Axon, PT OT Present: Cherylynn Ridges, OT SLP Present: Windell Moulding, SLP PPS Coordinator present : Daiva Nakayama, RN, CRRN     Current Status/Progress Goal Weekly Team Focus  Medical   Right lean, left-sided weakness,dysphagia, cognitive deficits and difficulty standing secondary to right MCA infarct with GI bleed  Improve mobility, safety, sleep, sundowning  See above   Bowel/Bladder   Continent and incontinent of bowel and bladder, urinary retention noted, bladder scans q 4-6 hours, pt noted with scan of 355 at 0200-cathed at 377m  To achieve continence of bowel and bladder with min assist  toileting every 2 hours and prn   Swallow/Nutrition/ Hydration   dys 1 textures, nectar thick liquids, tolerating sips of water per the water protocol   min A   family education prior to  discharge     ADL's   Supervision for UB bathing with mod assist for UB dressing.  Min to mod assist for LB bathing and dressing.  Still with left neglect, motor planning, LUE sensory and coordination deficits.  Min to mod assist for functional moiblity and transfers without assistive device.  min assist overall  neuromuscular re-education, selfcare retraining, transfer training, balance retraining,  pt/family education, therapeutic activity   Mobility   minA bed mobilty, min>maxA stand pivot transfers pending alertness level, gait up to 133f min/modA with RW, modA w/c mobilty  min assist overall  family ed, gait, balane, L NMR   Communication   Mod-max A, fluctuates depending on lethargy   mod assist   continue to address intelligibility strategies    Safety/Cognition/ Behavioral Observations  mod-max assist   mod assist   continue to address attention, initiation, and problem solving     Pain   No complaints of pain  To remain free of pain  Assess and treat pain q shift and as needed. Followup with MD as needed for unrelief of pain   Skin   scattered bruising  no skin breakdown/iinfection with min assist  assess skin q shift and as needed      *See Care Plan and progress notes for long and short-term goals.     Barriers to Discharge  Current Status/Progress Possible Resolutions Date Resolved   Physician    Medical stability;Other (comments)  Cognition/Sleep  See above  Therapies, optimize sleep/wake cycle, follow labs      Nursing                  PT                    OT                  SLP                SW                Discharge Planning/Teaching Needs:  Son continues to state that family plans to take pt home, however, also expresses concern that pt's lethargy is limiting any progress he can make.  Continue to make attempts to get solid commitment from son about home d/c regardless of functional/ assistance levels but difficult to get any solid answer from son.  Family ed is ongoing with son who is here daily.   Team Discussion:  Watch WBC; sleep/wake continues to be significant issue affecting therapy progress.  Team feels pt a little more confused the past couple of days.  req'd cath last pm.  Min/mod with LB b/d.  Still with left neglect.  Can range from min to max assist for all function dependent on arousal level.  Goals mostly downgraded to mod assist.  SW  to follow up with son.  Revisions to Treatment Plan:  Most goals downgraded.    Continued Need for Acute Rehabilitation Level of Care: The patient requires daily medical management by a physician with specialized training in physical medicine and rehabilitation for the following conditions: Daily direction of a multidisciplinary physical rehabilitation program to ensure safe treatment while eliciting the highest outcome that is of practical value to the patient.: Yes Daily medical management of patient stability for increased activity during participation in an intensive rehabilitation regime.: Yes Daily analysis of laboratory values and/or radiology reports with any subsequent need for medication adjustment of medical intervention for :  Neurological problems;Mood/behavior problems;Other  Kylena Mole 12/21/2017, 1:48 PM

## 2017-12-20 NOTE — Progress Notes (Signed)
Physical Therapy Session Note  Patient Details  Name: Dakota [name] MRN: [id number] Date of Birth: April 29, 1929  Today's Date: 12/20/2017 PT Individual Time: 0900-1000 PT Individual Time Calculation (min): 60 min   Short Term Goals: Week 3:  PT Short Term Goal 1 (Week 3): Patient to be able to perform functional bed mobility with MinA on 2/3 attempts  PT Short Term Goal 2 (Week 3): Patient to be able to perform stand-pivot transfers with MinA on 2/3 attempts  PT Short Term Goal 3 (Week 3): Family to demonstrate correct mechanics for bed mobility and stand-pivot transfers  PT Short Term Goal 4 (Week 3): Family to be able to verbalize importance of pressure relief and positioning strategies for use at home   Skilled Therapeutic Interventions/Progress Updates: Pt presented in bed with son present agreeable to therapy. Pt performed supine to sit bed mobility with minA for truncal support, use of bed features and increased time. Pt donned pants and sock with minA from PTA for time management. Pt performed sit to stand with minA with max verbal cues for hand placement for completion of LB dressing. Pt returned to EOB impulsively, pt performed squat pivot transfer to w/c to L with PTA providing HOH assist for reaching with LUE for w/c. Pt participated in w/c mobility with use of BUE x 158ft Pt was able to propel bouts of 20-82ft while maintaining mostly straight trajectory before requiring increased recruitment from LUE due to L drift. Pt also demonstrated x 2 occurences of negotiating objects on L safety without verbal cues. Pt transported remaining distance to rehab gym for energy conservation. Pt performed stand pivot transfer to mat minA. Performed sitting balance with visual feedback with use of theraband around neck for midline orientation. Pt noted to have significant L lean today. Pt participated in peg board for cognitive remediation and consistency to task. Pt was able to tolerate standing while  using peg board by approx 2 min before increased L lean requiring modA for correction. Pt was able to tolerate several bouts of standing and using peg board however pt would refuse use of LUE for peg board placement. Pt did use LUE once in sitting to remove pegs from board. Participated in gait training with RW with min/modAx 2ft. Pt required mod verbal cues for "kick" to increase L foot clearance however pt unable to correct L lean despite multimodal cues. Pt transported back to room and remained in w/c with SLP arriving and son present.      Therapy Documentation Precautions:  Precautions Precautions: Fall Precaution Comments: right gaze, left hemiparesis, left lean /pushing Restrictions Weight Bearing Restrictions: No General: PT Amount of Missed Time (min): 2 Minutes Vital Signs: Therapy Vitals Temp: 98.4 F (36.9 C) Temp Source: Oral Pulse Rate: 84 BP: 122/70 Patient Position (if appropriate): Sitting Oxygen Therapy SpO2: 99 % O2 Device: Room Air Pain: Pain Assessment Pain Scale: 0-10 Pain Score: 0-No pain   See Function Navigator for Current Functional Status.   Therapy/Group: Individual Therapy  Malaijah Houchen  Jaemarie Hochberg, PTA  12/20/2017, 3:43 PM

## 2017-12-20 NOTE — Progress Notes (Signed)
[place] PHYSICAL MEDICINE & REHABILITATION     PROGRESS NOTE  Subjective/Complaints:  Patient seen lying in bed this AM.  He slept better overnight.  Per nursing, he became restless after being incontinent. He is more alert this AM.  ROS: Limited due to cognition.   Objective: Vital Signs: Blood pressure 108/65, pulse 86, temperature 97.9 F (36.6 C), temperature source Oral, resp. rate 18, height 5\' 7"  (1.702 m), weight 72.6 kg, SpO2 95 %. Dg Chest Port 1 View  Result Date: 12/18/2017 CLINICAL DATA:  Dysphagia EXAM: PORTABLE CHEST 1 VIEW COMPARISON:  11/29/2017, 11/28/2017, 11/21/2017 FINDINGS: Left-sided single E pacing device. No acute opacity or pleural effusion. Normal cardiomediastinal silhouette. No pneumothorax. IMPRESSION: No active disease. Electronically Signed   By: Donavan Foil M.D.   On: 12/18/2017 18:26   Recent Labs    12/18/17 1113  WBC 11.6*  HGB 12.8*  HCT 41.1  PLT 233   Recent Labs    12/18/17 1113  NA 140  K 4.2  CL 106  GLUCOSE 107*  BUN 21  CREATININE 0.88  CALCIUM 9.2   CBG (last 3)  No results for input(s): GLUCAP in the last 72 hours.  Wt Readings from Last 3 Encounters:  12/20/17 72.6 kg  12/04/17 78.1 kg  11/21/17 81.7 kg    Physical Exam:  BP 108/65 (BP Location: Right Arm)   Pulse 86   Temp 97.9 F (36.6 C) (Oral)   Resp 18   Ht 5\' 7"  (1.702 m)   Wt 72.6 kg   SpO2 95%   BMI 25.07 kg/m  Constitutional: No distress . Vital signs reviewed. HENT: Normocephalic.  Atraumatic. Eyes: EOMI. No discharge. Cardiovascular: Irregularly irregular. No JVD. Respiratory: CTA bilaterally. Normal effort. GI: BS +. Non-distended. Musc: No edema or tenderness in extremities. Neurological: Alert and Oriented x2 HOH Dysarhtria Motor:  Motor: Right upper extremity: 5/5 proximal distal Left upper extremity: 4+/5 proximal to distal Bilateral lower extremities: Hip flexion 4-/5, knee extension 4-/5, ankle dorsiflexion  4+/5(?participation) Left inattention improved today Skin: Skin is warm and dry.  Psychiatric: alert   Assessment/Plan: 1. Functional deficits secondary to right MCA infarct with GI bleed which require 3+ hours per day of interdisciplinary therapy in a comprehensive inpatient rehab setting. Physiatrist is providing close team supervision and 24 hour management of active medical problems listed below. Physiatrist and rehab team continue to assess barriers to discharge/monitor patient progress toward functional and medical goals.  Function:  Bathing Bathing position   Position: Sitting EOB  Bathing parts Body parts bathed by patient: Right arm, Left arm, Chest, Abdomen, Right upper leg, Left upper leg, Right lower leg, Left lower leg Body parts bathed by helper: Front perineal area, Buttocks, Back  Bathing assist Assist Level: Touching or steadying assistance(Pt > 75%)      Upper Body Dressing/Undressing Upper body dressing   What is the patient wearing?: Pull over shirt/dress     Pull over shirt/dress - Perfomed by patient: Thread/unthread right sleeve, Put head through opening Pull over shirt/dress - Perfomed by helper: Thread/unthread left sleeve, Pull shirt over trunk        Upper body assist Assist Level: Touching or steadying assistance(Pt > 75%)      Lower Body Dressing/Undressing Lower body dressing   What is the patient wearing?: Pants, Shoes, [employer]- Performed by patient: Thread/unthread right pants leg, Thread/unthread left pants leg Pants- Performed by helper: Pull pants up/down Non-skid slipper socks- Performed by  patient: Don/doff right sock, Don/doff left sock Non-skid slipper socks- Performed by helper: Don/doff right sock, Don/doff left sock     Shoes - Performed by patient: Don/doff right shoe Shoes - Performed by helper: Don/doff left shoe       TED Hose - Performed by helper: Don/doff right TED hose, Don/doff left TED hose  Lower body  assist Assist for lower body dressing: Touching or steadying assistance (Pt > 75%)      Toileting Toileting Toileting activity did not occur: No continent bowel/bladder event Toileting steps completed by patient: Adjust clothing prior to toileting Toileting steps completed by helper: Adjust clothing prior to toileting, Performs perineal hygiene, Adjust clothing after toileting Toileting Assistive Devices: Grab bar or rail  Toileting assist Assist level: Two helpers   Transfers Chair/bed transfer   Chair/bed transfer [place]: Stand pivot Chair/bed transfer assist level: Moderate assist (Pt 50 - 74%/lift or lower) Chair/bed transfer assistive device: Armrests Mechanical lift: Ecologist     Max distance: 75" Assist level: 2 helpers   Wheelchair   Type: Manual Max wheelchair distance: 51ft Assist Level: Moderate assistance (Pt 50 - 74%)  Cognition Comprehension Comprehension assist level: Understands basic 25 - 49% of the time/ requires cueing 50 - 75% of the time  Expression Expression assist level: Expresses basic 25 - 49% of the time/requires cueing 50 - 75% of the time. Uses single words/gestures.  Social Interaction Social Interaction assist level: Interacts appropriately 50 - 74% of the time - May be physically or verbally inappropriate.  Problem Solving Problem solving assist level: Solves basic 25 - 49% of the time - needs direction more than half the time to initiate, plan or complete simple activities  Memory Memory assist level: Recognizes or recalls 25 - 49% of the time/requires cueing 50 - 75% of the time    Medical Problem List and Plan: 1.  Right lean, left-sided weakness,dysphagia, cognitive deficits and difficulty standing secondary to right MCA infarct with GI bleed.  Continue CIR - per son pt does better with cognition "once he steps out of hospital", allow grounds pass with family if pt is alert and not agitated 2.  DVT  Prophylaxis/Anticoagulation: Mechanical:  Antiembolism stockings, knee (TED hose) Bilateral lower extremities 3. Pain Management: tylenol prn 4. Mood: LCSW to follow for evaluation and support as needed.  5. Neuropsych: This patient is not capable of making decisions on his own behalf.  Will consider trial Amantadine 100  6. Skin/Wound Care: routine pressure relief measures.  7. Fluids/Electrolytes/Nutrition: Monitor I/O.  8. ABLA due to duodenal ulcer/GIB:   Monitor H/H serial checks.   Increased protonix to 40 mg bid.  Hemoglobin 12.8 on 8/19 9.  Chronic A fib: Monitor HR bid. No anticoagulation for now. No medications due to hypotension.  10. Delirium/sundowning:ongoing issue added mirtazepine, changed restoril to prn, DC'd on 8/19, will give remeron at 8 pm  -Continue Depakote tid    Seroquel DC'd on 8/8 due to oversedation, trazodone ineffective, restoril ?ineffective  Depakote level subtherapeutic--maintain same dose- no seizure, decreased on 8/19  Melatonin started on 8/20 11. Constipation  Increased senna to bid. 12. Prediabetes  Reasonable control 13. Hypoalbuminemia  Supplement initiated on 8/6, po intake improved 14. Blood pressure  -reasonable control 8/16 15. Leukocytosis  WBCs 11.6 on 8/19  Afebrile  UA negative, urine culture negative  Chest x-ray reviewed, unremarkable for acute infectious process    LOS (Days) 16 A FACE TO FACE EVALUATION WAS PERFORMED  Pamlea Finder [name] 12/20/2017 10:04 AM

## 2017-12-20 NOTE — Progress Notes (Signed)
Occupational Therapy Session Note  Patient Details  Name: Dakota [name] MRN: [id number] Date of Birth: December 15, 1928  Today's Date: 12/20/2017 OT Individual Time: 1120-1205 OT Individual Time Calculation (min): 45 min    Short Term Goals: Week 2:  OT Short Term Goal 1 (Week 2): Pt completed UB selfcare sitting with min assist.  OT Short Term Goal 2 (Week 2): Pt will complete LB bathing min assist sit to stand.   OT Short Term Goal 3 (Week 2): Pt will complete LB dressing sit to stand with mod assist. OT Short Term Goal 4 (Week 2): Pt will complete toilet transfers and shower transfers stand pivot with min assist.   OT Short Term Goal 5 (Week 2): Pt will locate grooming and bathing items left of midline with no more and min instructional cueing.  Skilled Therapeutic Interventions/Progress Updates:    Pt received in w/c with his son with him. Pt stated he did not want to bathe today and he had clean clothes on. Pt agreeable to therapy in the gym. Pt transported to the gym and pt completed a squat pivot (even though cued to stand pivot) to mat to L with min A.   Pt initially leaning minimally to his L with pelvis tilted to L.  Placed elevated bedside table on his R and with R arm on table pt's posture improved.    Began session, with a reaching activity to focus on elongated reach to his L and visual scanning to his L along with L hand praxis.  Pt would reach to his L but tip with no use of trunk weight shift. Facilitation through pelvis/ rib cage to elongate L trunk and shorted R side for reach to the left.   Pt needed frequent cues to scan to the left.  He also demonstrated great difficulty of motor control of L hand with opening and closing.  Worked on reaching for bean bags, then cups then cones.  With all items, he needed hand over hand to fully extend fingers/ thumb and then to release once he had a full grip. Attempted isolated movement patterns with grasp and stretch of squeezing a washcloth  and then spreading it open, but pt needed significant A and would not visually focus on hand. Switched exercise to bimanual grasp on dowel bar.  Pt was able to use B hands/arms to hold bar and lift to make contact with a soft ball tossed at him and hit it back to the therapist.  Pt got very involved with this activity demonstrating improved attention, much faster smoother movements, and maintained symmetrical sitting balance well.  To focus on left UE initiation had pt donn boxing gloves to hit a punching bag.  Pt needed min cues to use his L not just his R but again was very focused on this activity and seemed to really enjoy it.  Pt them worked on sit to stand holding the dowel bar in B hands. Pt able to rise to stand 6x total with steadying A to light contact guard.  In standing, pt held stand for just a few seconds at a time.  Cued to lift torso.     Pt was then able to complete a stand pivot to his R with min A to his w/c.  Pt's son took him back to his room.  Therapy Documentation Precautions:  Precautions Precautions: Fall Precaution Comments: right gaze, left hemiparesis, left lean /pushing Restrictions Weight Bearing Restrictions: No   Pain: Pain Assessment Pain  Scale: 0-10 Pain Score: 0-No pain   ADL:  See Function Navigator for Current Functional Status.   Therapy/Group: Individual Therapy  Joseph 12/20/2017, 12:47 PM

## 2017-12-20 NOTE — Progress Notes (Signed)
Physical Therapy Session Note  Patient Details  Name: Dakota [name] MRN: [id number] Date of Birth: 29-Apr-1929  Today's Date: 12/20/2017 PT Individual Time: 1301-1329 PT Individual Time Calculation (min): 28 min   Short Term Goals: Week 3:  PT Short Term Goal 1 (Week 3): Patient to be able to perform functional bed mobility with MinA on 2/3 attempts  PT Short Term Goal 2 (Week 3): Patient to be able to perform stand-pivot transfers with MinA on 2/3 attempts  PT Short Term Goal 3 (Week 3): Family to demonstrate correct mechanics for bed mobility and stand-pivot transfers  PT Short Term Goal 4 (Week 3): Family to be able to verbalize importance of pressure relief and positioning strategies for use at home   Skilled Therapeutic Interventions/Progress Updates:    Patient received up in [facility], family present and participated in session today. Attempted WC mobility using B UEs/LEs with patient with difficulty in using limbs symmetrically and veering L into wall, requiring ModA for straight line navigation. Able to stand-pivot transfer to Nustep with ModA and performed 5 minutes of exercise on this machine for reciprocal pattern and potential carryover for gait. Transferred back to [facility] with [place] and then performed gait approximately 23f on wall with rail on R and HHA on L, Mod cues and MinA-ModA for balance. [facility] was [facility]; [facility] and all other needs met.   Therapy Documentation Precautions:  Precautions Precautions: Fall Precaution Comments: right gaze, left hemiparesis, left lean /pushing Restrictions Weight Bearing Restrictions: No General: PT Amount of Missed Time (min): 2 Minutes Vital Signs: Therapy Vitals Temp: 98.4 F (36.9 C) Temp Source: Oral Pulse Rate: 84 BP: 122/70 Patient Position (if appropriate): Sitting Oxygen Therapy SpO2: 99 % O2 Device: Room Air Pain: Pain Assessment Pain  Scale: 0-10 Pain Score: 0-No pain   See Function Navigator for Current Functional Status.   Therapy/Group: Individual Therapy  KDeniece ReePT, DPT, CBIS  Supplemental Physical Therapist [facility]  Pager [phone]  12/20/2017, 4:04 PM

## 2017-12-20 NOTE — Progress Notes (Signed)
Nutrition Follow-up  DOCUMENTATION CODES:   Not applicable  INTERVENTION:  Continue Ensure Enlive po TID (thickened to appropriate consistency), each supplement provides 350 kcal and 20 grams of protein.  Continue 30 ml Prostat po BID, each supplement provides 100 kcal and 15 grams of protein.   Encourage adequate PO intake.   NUTRITION DIAGNOSIS:   Inadequate oral intake related to dysphagia as evidenced by per patient/family report(varied meal completion); improving  GOAL:   Patient will meet greater than or equal to 90% of their needs; met  MONITOR:   PO intake, Supplement acceptance, Diet advancement, Weight trends, Labs, Skin, I & O's  REASON FOR ASSESSMENT:   Consult Calorie Count  ASSESSMENT:   84 year old male with history of hypertension, complete heart block status post permanent pacemaker, GERD/Schatzki's ring, AF no Coumadin who was admitted on zero 7/18 10/2017 with left-sided weakness, right gaze preference, slurred speech and mental status changes due to right MCA territory infarct. On 11/24/2017 he developed maroon stools due to GI bleed and was transfused to acute services for closer monitoring. Pt now readmitted to CIR.   Meal completion has been 50-90%. Intake has been improving. Pt continues on a dysphagia 1 diet with nectar thick liquids. Pt currently has Ensure and Prostat ordered and has been consuming them. RD to continue with current orders to aid in adequate nutrition.    Labs and medications reviewed.   Diet Order:   Diet Order            DIET - DYS 1 Room service appropriate? Yes; Fluid consistency: Nectar Thick  Diet effective now              EDUCATION NEEDS:   Not appropriate for education at this time  Skin:  Skin Assessment: Reviewed RN Assessment  Last BM:  8/20  Height:   Ht Readings from Last 1 Encounters:  12/05/17 '5\' 7"'  (1.702 m)    Weight:   Wt Readings from Last 1 Encounters:  12/20/17 72.6 kg    Ideal Body  Weight:  67.27 kg  BMI:  Body mass index is 25.07 kg/m.  Estimated Nutritional Needs:   Kcal:  1700-1900  Protein:  75-85 grams  Fluid:  >/= 1.7 L/day    Corrin Parker, MS, RD, LDN Pager # [phone] After hours/ weekend pager # [phone]

## 2017-12-20 NOTE — Progress Notes (Signed)
Speech Language Pathology Daily Session Note  Patient Details  Name: Dakota [name] MRN: [id number] Date of Birth: 12-11-1928  Today's Date: 12/20/2017 SLP Individual Time: 1005-1100 SLP Individual Time Calculation (min): 55 min  Short Term Goals: Week 3: SLP Short Term Goal 1 (Week 3): Pt will consume therapeutic trials of thin liquids with minimal overt s/s of aspiration and min cues for use of swallowing strategies.  SLP Short Term Goal 2 (Week 3): Pt will complete basic familiar problem solving tasks related to ADLs with Min A cues.  SLP Short Term Goal 3 (Week 3): Pt will sustain attention to basic, familiar tasks for ~10 minute intervals with mod assist multimodal cues.  SLP Short Term Goal 4 (Week 3): Pt will slow rate and increase vocal intensity to achieve ~75%  intelligibility at the phrase level with mod assist.   SLP Short Term Goal 5 (Week 3): Pt will locate items to the left of midline during basic familiar tasks in ~50% of opportunities with mod assist multimodal cues.   Skilled Therapeutic Interventions:  Pt was seen for skilled ST targeting goals for dysphagia and cognition.  Pt was received upright in wheelchair with son at bedside.  Pt appeared frustrated by his perceived lack of progress, stating he felt like he wasn't ever going to be able to leave the hospital.  SLP provided encouragement and offered specific examples of his progress from initial evaluation.  Pt was eventually agreeable to participate in therapy.  SLP facilitated the session with therapeutic trials of thin liquids following oral care.  Pt demonstrated no overt s/s of aspiration with thin liquids even when allowed to take large sips of liquids.  SLP also facilitated the session with a previously targeted pipe tree task to address goals for attention to the left, problem solving, and attention to task.  Pt sustained his attention to task for ~5 minute intervals with mod cues for redirection to task.  He needed  mod-max cues for basic problem solving when recreating structures from pictures with PVC pipe due to perseveration, poor task organization, and decreased awareness of items needed to complete task on his left side.  Pt's affect remained quite flat throughout therapy session and pt repeatedly engaged in negative self talk.  Therapist continued to offer encouragement.  Pt was left in wheelchair with son at bedside.  Continue per current plan of care.    Function:  Eating Eating   Modified Consistency Diet: Yes Eating Assist Level: Supervision or verbal cues;Set up assist for   Eating Set Up Assist For: Opening containers       Cognition Comprehension Comprehension assist level: Understands basic 50 - 74% of the time/ requires cueing 25 - 49% of the time  Expression   Expression assist level: Expresses basic 50 - 74% of the time/requires cueing 25 - 49% of the time. Needs to repeat parts of sentences.  Social Interaction Social Interaction assist level: Interacts appropriately 50 - 74% of the time - May be physically or verbally inappropriate.  Problem Solving Problem solving assist level: Solves basic 25 - 49% of the time - needs direction more than half the time to initiate, plan or complete simple activities  Memory Memory assist level: Recognizes or recalls 25 - 49% of the time/requires cueing 50 - 75% of the time    Pain Pain Assessment Pain Scale: 0-10 Pain Score: 0-No pain  Therapy/Group: Individual Therapy  Amayra Kiedrowski, Selinda Orion 12/20/2017, 12:40 PM

## 2017-12-21 ENCOUNTER — Inpatient Hospital Stay (HOSPITAL_COMMUNITY): Payer: Medicare Other | Admitting: Speech Pathology

## 2017-12-21 ENCOUNTER — Inpatient Hospital Stay (HOSPITAL_COMMUNITY): Payer: Medicare Other | Admitting: Physical Therapy

## 2017-12-21 ENCOUNTER — Inpatient Hospital Stay (HOSPITAL_COMMUNITY): Payer: Medicare Other | Admitting: Occupational Therapy

## 2017-12-21 MED ORDER — MELATONIN 3 MG PO TABS
3.0000 mg | ORAL_TABLET | Freq: Every day | ORAL | Status: DC
  Administered 2017-12-21 – 2017-12-25 (×5): 3 mg via ORAL
  Filled 2017-12-21 (×5): qty 1

## 2017-12-21 MED ORDER — SORBITOL 70 % SOLN: 30.0000 mL | Freq: Every day | Status: DC | PRN

## 2017-12-21 MED ORDER — SENNOSIDES-DOCUSATE SODIUM 8.6-50 MG PO TABS
2.0000 | ORAL_TABLET | Freq: Two times a day (BID) | ORAL | Status: DC
  Administered 2017-12-21 – 2017-12-26 (×11): 2 via ORAL
  Filled 2017-12-21 (×12): qty 2

## 2017-12-21 MED ORDER — DIVALPROEX SODIUM 125 MG PO CSDR
125.0000 mg | DELAYED_RELEASE_CAPSULE | Freq: Two times a day (BID) | ORAL | Status: DC
  Administered 2017-12-21 – 2017-12-23 (×6): 125 mg via ORAL
  Filled 2017-12-21 (×6): qty 1

## 2017-12-21 NOTE — Discharge Summary (Signed)
Physician Discharge Summary  Patient ID: [name] MRN: [id number] DOB/AGE: 06-Dec-1928 84 y.o.  Admit date: 12/04/2017 Discharge date: 12/26/2017  Discharge Diagnoses:  Principal Problem:   Acute ischemic right MCA stroke [facility]) Active Problems:   Acute blood loss anemia   Delirium   History of GI bleed   Labile blood pressure   Slow transit constipation   Sundowning   Sleep disturbance   Dysphagia due to recent stroke   Urinary frequency   Discharged Condition:  Stable   Significant Diagnostic Studies: Dg Chest 1 View  Result Date: 11/28/2017 CLINICAL DATA:  Status post PICC line placement. EXAM: CHEST  1 VIEW COMPARISON:  Radiographs of November 21, 2017. FINDINGS: Stable cardiomegaly. Single lead left-sided pacemaker is unchanged in position. Interval placement of right internal jugular catheter with distal tip in expected position of the SVC. No pneumothorax or pleural effusion is noted. Bony thorax is unremarkable. IMPRESSION: Interval placement of right internal jugular catheter with distal tip in expected position of the SVC. No pneumothorax is noted. Electronically Signed   By: Marijo Conception, M.D.   On: 11/28/2017 12:40   Dg Chest Port 1 View  Result Date: 12/18/2017 CLINICAL DATA:  Dysphagia EXAM: PORTABLE CHEST 1 VIEW COMPARISON:  11/29/2017, 11/28/2017, 11/21/2017 FINDINGS: Left-sided single E pacing device. No acute opacity or pleural effusion. Normal cardiomediastinal silhouette. No pneumothorax. IMPRESSION: No active disease. Electronically Signed   By: Donavan Foil M.D.   On: 12/18/2017 18:26   Dg Chest Port 1 View  Result Date: 11/29/2017 CLINICAL DATA:  Respiratory failure EXAM: PORTABLE CHEST 1 VIEW COMPARISON:  11/28/2017 FINDINGS: Left pacer and right central line remain in place, unchanged. Heart is upper limits normal in size. Lungs clear. No effusions or acute bony abnormality. IMPRESSION: No active disease. Electronically Signed   By: Rolm Baptise M.D.   On:  11/29/2017 07:20   Dg Swallowing Func-speech Pathology  Result Date: 12/08/2017 Objective Swallowing Evaluation: Type of Study: MBS-Modified Barium Swallow Study  Patient Details Name: Dakota [name] MRN: [id number] Date of Birth: February 01, 1929 Today's Date: 12/08/2017 Time: SLP Start Time (ACUTE ONLY): 1105 -SLP Stop Time (ACUTE ONLY): 1117 SLP Time Calculation (min) (ACUTE ONLY): 12 min Past Medical History: Past Medical History: Diagnosis Date . Anxiety  . Back pain, chronic  . Complete heart block ([place]) 07/18/2014  Medtronic [place] model [id number] (serial number [id number])  singe lead PPM . Esophageal obstruction due to food impaction 2017 . GERD (gastroesophageal reflux disease) 10/2002 . Hyperlipidemia 12/1994 . Hypertension  . Hypokalemia  . Lung collapse 03/1991  Fall . Permanent atrial fibrillation ([name])   Refused coumadin therapy Past Surgical History: Past Surgical History: Procedure Laterality Date . BACK SURGERY  11/91  with hardware fixation . BIOPSY  11/27/2017  Procedure: BIOPSY;  Surgeon: Laurence Spates, MD;  Location: [place];  Service: Endoscopy;; . CARDIAC CATHETERIZATION  06/2004  30 % stenosis, EF normal . ESOPHAGOGASTRODUODENOSCOPY N/A 01/06/2016  Procedure: ESOPHAGOGASTRODUODENOSCOPY (EGD);  Surgeon: Otis Brace, MD;  Location: [place];  Service: Gastroenterology;  Laterality: N/A; . ESOPHAGOGASTRODUODENOSCOPY N/A 11/27/2017  Procedure: ESOPHAGOGASTRODUODENOSCOPY (EGD);  Surgeon: Laurence Spates, MD;  Location: [facility] ENDOSCOPY;  Service: Endoscopy;  Laterality: N/A; . EYE SURGERY    Retinal bubble surgery, cataract  . IR CT HEAD LTD  11/16/2017 . IR PERCUTANEOUS ART THROMBECTOMY/INFUSION INTRACRANIAL INC DIAG ANGIO  11/16/2017 . LEFT HEART CATHETERIZATION WITH CORONARY ANGIOGRAM N/A 07/18/2014  Procedure: LEFT HEART CATHETERIZATION WITH CORONARY ANGIOGRAM;  Surgeon: Jettie Booze, MD; Dorene Ar  OK, LAD mild dz, D1 80%, D2 OK, CFX system OK, RCA OK, PDA 100%, med rx . PERMANENT PACEMAKER  INSERTION N/A 07/18/2014  Procedure: PERMANENT PACEMAKER INSERTION;  Surgeon: Thompson Grayer, MD; Medtronic [place] model [phone] (serial number [id number])   . RADIOLOGY WITH ANESTHESIA N/A 11/16/2017  Procedure: RADIOLOGY WITH ANESTHESIA;  Surgeon: Luanne Bras, MD;  Location: [place];  Service: Radiology;  Laterality: N/A; . TEMPORARY PACEMAKER INSERTION N/A 07/18/2014  Procedure: TEMPORARY PACEMAKER INSERTION;  Surgeon: Peter M Martinique, MD;  Location: [facility] CATH LAB;  Service: Cardiovascular;  Laterality: N/A; HPI: 84 yo admitted with left weakness and right gaze preference with right basal ganglia infarct s/p revascularization. 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, atrial fibrillation. MBS 11/19/17 penetration/trace aspiraiton thin and multiple cup sips nectar, recommending Dys 1, nectar by teaspoon only. Pt on inpatient rehab and developed concerns for  GIB and transferred to stepsdown unit on acute venue. EGD 7/29 > melena due to duodenal ulcer and gastritis and clear liquids rec'd by GI however pt on nectar thick liquids currently due to dysphagia. ST ordered to evaluate for thickened consistency. CXR No active cardiopulmonary disease.  Subjective: Alert, distractible Assessment / Plan / Recommendation CHL IP CLINICAL IMPRESSIONS 12/08/2017 Clinical Impression Pt presents with moderate oral phase deficits related to cognitive deficits and oral motor deficits c/b decrease lingual manipulation resulting in decreased bolus cohesion, premature spillage and significant lignual pumping for bolus movement and swallow initiation. Pt also presents with mild sensorimotor pharyngeal phase c/b delayed swallow initiation to pyriform sinuses with mild widespread residue throughout pharynx when consuming nectar thick and thin liquids. Pt with no penetration or aspiration with nectar thick via cups (extensive trials provided) and thin liquids via tsp. However, pt's pyriform sinuses unable to contain cup  sips of thin lqiuids resulting in penetration and eventual aspiration of thin liquids via cup. Pt with delayed cough response to aspiration event (sensed aspiration). Would recommend upgrading to nectar thick liquids via cup, continue puree, mekdicine crushed in applesauce and trials of thin liquids via tsp with SLP. Can upgrade to thin liquids at bedside if pt doesn't demonstrate cough.  SLP Visit Diagnosis Dysphagia, oropharyngeal phase (R13.12) Attention and concentration deficit following -- Frontal lobe and executive function deficit following Cerebral infarction Impact on safety and function Moderate aspiration risk;Mild aspiration risk   CHL IP TREATMENT RECOMMENDATION 12/08/2017 Treatment Recommendations Therapy as outlined in treatment plan below   Prognosis 11/30/2017 Prognosis for Safe Diet Advancement Good Barriers to Reach Goals -- Barriers/Prognosis Comment -- CHL IP DIET RECOMMENDATION 12/08/2017 SLP Diet Recommendations Dysphagia 1 (Puree) solids;Nectar thick liquid Liquid Administration via Cup;No straw Medication Administration Crushed with puree Compensations Slow rate;Small sips/bites;Minimize environmental distractions Postural Changes Seated upright at 90 degrees   CHL IP OTHER RECOMMENDATIONS 12/08/2017 Recommended Consults -- Oral Care Recommendations Oral care BID Other Recommendations Order thickener from pharmacy   CHL IP FOLLOW UP RECOMMENDATIONS 11/30/2017 Follow up Recommendations 24 hour supervision/assistance;Other (comment)   CHL IP FREQUENCY AND DURATION 11/30/2017 Speech Therapy Frequency (ACUTE ONLY) min 2x/week Treatment Duration 2 weeks      CHL IP ORAL PHASE 12/08/2017 Oral Phase Impaired Oral - Pudding Teaspoon -- Oral - Pudding Cup -- Oral - Honey Teaspoon NT Oral - Honey Cup -- Oral - Nectar Teaspoon Weak lingual manipulation;Reduced posterior propulsion;Incomplete tongue to palate contact;Lingual pumping Oral - Nectar Cup Weak lingual manipulation;Lingual pumping;Incomplete tongue to  palate contact;Reduced posterior propulsion;Lingual/palatal residue;Premature spillage;Decreased bolus cohesion Oral - Nectar Straw -- Oral -  Thin Teaspoon Weak lingual manipulation;Lingual pumping;Incomplete tongue to palate contact;Reduced posterior propulsion Oral - Thin Cup Weak lingual manipulation;Lingual pumping;Incomplete tongue to palate contact;Reduced posterior propulsion;Lingual/palatal residue;Premature spillage;Decreased bolus cohesion Oral - Thin Straw -- Oral - Puree Weak lingual manipulation;Lingual pumping;Incomplete tongue to palate contact;Reduced posterior propulsion;Decreased bolus cohesion;Premature spillage Oral - Mech Soft Impaired mastication;Weak lingual manipulation;Other (Comment);Right pocketing in lateral sulci;Reduced posterior propulsion;Incomplete tongue to palate contact;Lingual pumping;Premature spillage Oral - Regular NT Oral - Multi-Consistency -- Oral - Pill -- Oral Phase - Comment --  CHL IP PHARYNGEAL PHASE 12/08/2017 Pharyngeal Phase Impaired Pharyngeal- Pudding Teaspoon -- Pharyngeal -- Pharyngeal- Pudding Cup -- Pharyngeal -- Pharyngeal- Honey Teaspoon NT Pharyngeal -- Pharyngeal- Honey Cup -- Pharyngeal -- Pharyngeal- Nectar Teaspoon Delayed swallow initiation-vallecula;Reduced pharyngeal peristalsis;Reduced tongue base retraction;Pharyngeal residue - valleculae;Pharyngeal residue - pyriform Pharyngeal -- Pharyngeal- Nectar Cup Delayed swallow initiation-pyriform sinuses;Reduced pharyngeal peristalsis;Reduced tongue base retraction;Pharyngeal residue - valleculae;Pharyngeal residue - pyriform Pharyngeal -- Pharyngeal- Nectar Straw -- Pharyngeal -- Pharyngeal- Thin Teaspoon Delayed swallow initiation-pyriform sinuses;Reduced pharyngeal peristalsis Pharyngeal -- Pharyngeal- Thin Cup Delayed swallow initiation-pyriform sinuses;Reduced pharyngeal peristalsis;Reduced airway/laryngeal closure;Reduced tongue base retraction;Penetration/Aspiration during swallow;Pharyngeal residue  - valleculae;Pharyngeal residue - pyriform Pharyngeal Material enters airway, passes BELOW cords and not ejected out despite cough attempt by patient Pharyngeal- Thin Straw -- Pharyngeal -- Pharyngeal- Puree Delayed swallow initiation-pyriform sinuses;Reduced pharyngeal peristalsis;Reduced tongue base retraction;Pharyngeal residue - valleculae;Pharyngeal residue - pyriform Pharyngeal -- Pharyngeal- Mechanical Soft Other (Comment) Pharyngeal -- Pharyngeal- Regular NT Pharyngeal -- Pharyngeal- Multi-consistency -- Pharyngeal -- Pharyngeal- Pill -- Pharyngeal -- Pharyngeal Comment --  CHL IP CERVICAL ESOPHAGEAL PHASE 12/08/2017 Cervical Esophageal Phase WFL Pudding Teaspoon -- Pudding Cup -- Honey Teaspoon -- Honey Cup -- Nectar Teaspoon -- Nectar Cup -- Nectar Straw -- Thin Teaspoon -- Thin Cup -- Thin Straw -- Puree -- Mechanical Soft -- Regular -- Multi-consistency -- Pill -- Cervical Esophageal Comment -- Happi Overton 12/08/2017, 12:14 PM               Labs:  Basic Metabolic Panel: BMP Latest Ref Rng & Units 12/25/2017 12/18/2017 12/14/2017  Glucose 70 - 99 mg/dL 105(H) 107(H) 103(H)  BUN 8 - 23 mg/dL 28(H) 21 23  Creatinine 0.61 - 1.24 mg/dL 1.08 0.88 1.01  Sodium 135 - 145 mmol/L 142 140 138  Potassium 3.5 - 5.1 mmol/L 4.3 4.2 4.3  Chloride 98 - 111 mmol/L 106 106 100  CO2 22 - 32 mmol/L 25 27 32  Calcium 8.9 - 10.3 mg/dL 9.5 9.2 9.0    CBC: CBC Latest Ref Rng & Units 12/25/2017 12/18/2017 12/14/2017  WBC 4.0 - 10.5 K/uL 12.2(H) 11.6(H) 7.8  Hemoglobin 13.0 - 17.0 g/dL 13.9 12.8(L) 11.5(L)  Hematocrit 39.0 - 52.0 % 44.7 41.1 36.5(L)  Platelets 150 - 400 K/uL 285 233 286    CBG: No results for input(s): GLUCAP in the last 168 hours.  Brief HPI:   Eulalio is an 84 year old male with history of hypertension,  complete HD s/p PPM, GERD/Schatzki's ring, A. Fib- no coumadin; wife and you are probably use the no Levemir and was on before now we will go on what he is right now except for the sliding  scale insulin he will not do sliding scale insulin who was admitted on 11/16/2017 with left-sided weakness, right gaze preference slurred speech and mental status changes due to right MCA infarct.  He underwent cerebral angiogram with revascularization of right MCA M2 segment.  Hospital course significant for issues with confusion and delirium and extension of  stroke which was felt to be embolic due to A. Fib.    He was admitted to CIR on 07/23, for intensive rehab program and stay was significant for ongoing issues with delirium as well as maroon stools due to GI bleed.  She was transferred to acute services and underwent EGD revealing nonsevere gastritis in the lower third of esophagus, moderate inflammation with erosion and erythema gastric antrum and one partially obstructing nonbleeding cratered duodenal ulcer.  He was treated with IV Protonix and and was transfused with multiple units packed red blood cells.  He was started on IV heparin but had another episode of bloody stools therefore anticoagulation discontinued.  H&H was stable and he was transferred transferred back on 12/04/17 to resume his CIR course.  Hospital Course: JLYNN LANGILLE was admitted to rehab 12/04/2017 for inpatient therapies to consist of PT, ST and OT at least three hours five days a week. Past admission physiatrist, therapy team and rehab RN have worked together to provide customized collaborative inpatient rehab.  Continued to have issues with delirium sleep-wake disruption.  Seroquel was used in attempts to help normalize this.  He had issues with excessive sedation therefore this was discontinued.  Nutritional supplements were added due to variable p.o. intake.  Intake has been affected by fluctuating mentation, apraxia and dysphagia.  He continues on dysphagia 1 diet with nectar thick liquids.  Renal status has been monitored and is stable.  Speech therapy has been ongoing with close monitoring for diet tolerance. He is to  continue on current modified diet with water protocol between meals. Extensive education done with family about safety with meals.   His progress has varied depending on fatigue, sleep and cognitive deficits with poor carryover. He is currently at min to mod assist for all activity. He requires mod to max cues for safety when distracted. Family has been present during the day and for all therapy sessions. Family comfortable with care needed as they felt that he would improve once back in familiar environment. He will continue to receive follow up [place], [place], [place] and [place] by [place] after discharge.    Rehab course: During patient's stay in rehab weekly team conferences were held to monitor patient's progress, set goals and discuss barriers to discharge. At admission, patient required max assist +2 for mobility and max to total assist with ADL tasks. He continued to exhibit moderate oral phase dysphagia related to cognitive and oral motor deficits, severe cognitive deficits affecting STM as well as intellectual awareness.  He  has had improvement in activity tolerance, balance, postural control as well as ability to compensate for deficits. He has had improvement in functional use LUE  and LLE as well as improvement in awareness. He is able to complete ADL tasks with min assist.  He requires min assist with moderate verbal cues to sequence transfers and to ambulate 125' with RW. He requires mod to max verbal cues for LLE steppage and clearance.  Family education has been completed regarding all aspects of care, mobility, diet and safety.     Disposition:  Home  Diet: Dysphagia 1,  Nectar liquids.   Special Instructions: 1. Family to provide hands on assist with all activity.  2. Needs full supervision with  Meals. Do not feed patient if not fully awake. Foods have be be purred and liquids have to be thickened as instructed.  Follow water protocol after oral care between meals.  3. Set a  daily routine to  help with sleep wake cycle.    Discharge Instructions    Ambulatory referral to Physical Medicine Rehab   Complete by:  As directed    1-2 weeks transitional care appt     Allergies as of 12/26/2017      Reactions   Warfarin And Related Other (See Comments)   Lost a lot of weight and also experienced numbness   Atorvastatin Other (See Comments)   REACTION: MUSCLE PAIN   Ezetimibe Other (See Comments)   REACTION: MUSCLE PAIN   Diazepam Other (See Comments)   UNKNOWN, per pt   Penicillins Other (See Comments)   UNKNOWN, per pt      Medication List    STOP taking these medications   divalproex 125 MG capsule Commonly known as:  DEPAKOTE SPRINKLE   pantoprazole 40 MG tablet Commonly known as:  PROTONIX   QUEtiapine 25 MG tablet Commonly known as:  SEROQUEL     TAKE these medications   chlorhexidine 0.12 % solution Commonly known as:  PERIDEX 15 mLs by Mouth Rinse route 2 (two) times daily.   darifenacin 7.5 MG 24 hr tablet Commonly known as:  ENABLEX Take 1 tablet (7.5 mg total) by mouth at bedtime.   famotidine 20 MG tablet Commonly known as:  PEPCID Take 1 tablet (20 mg total) by mouth 2 (two) times daily.   Melatonin 3 MG Tabs Take 1 tablet (3 mg total) by mouth at bedtime.   mirtazapine 15 MG disintegrating tablet Commonly known as:  REMERON SOL-TAB Take 1 tablet (15 mg total) by mouth at bedtime. To help with sleep, mood and appetite   nicotine 7 mg/24hr patch Commonly known as:  NICODERM CQ - dosed in mg/24 hr Place 1 patch (7 mg total) onto the skin daily.   RESOURCE THICKENUP CLEAR Powd Take 1 g by mouth as needed (nectar thick liquid consistency). What changed:  how much to take   senna-docusate 8.6-50 MG tablet Commonly known as:  Senokot-S Take 2 tablets by mouth 2 (two) times daily.   white petrolatum Oint Commonly known as:  VASELINE Apply 1 application topically as needed for lip care. Notes to patient:  This is  available over-the-counter --for dry lips      Follow-up Information    [name], MD Follow up.   Specialty:  Physical Medicine and Rehabilitation Why:  Office will call you with follow up appointment Contact information: [address] [place] [address] [phone]        Laurence Spates, MD Follow up.   Specialty:  Gastroenterology Why:  for follow up on GI bleed Contact information: [address] [place] [address] [phone]        [facility]. Call.   Why:  for follow up appointment Contact information: [address]     [address] [place] [address] [phone]       Tonia Ghent, MD Follow up on 01/08/2018.   Specialty:  Family Medicine Why:  @ 12:00 pm (hospital follow up appt) Contact information: [place] [address] [phone]           Signed: Bary Leriche 12/27/2017, 12:00 PM

## 2017-12-21 NOTE — Progress Notes (Signed)
Occupational Therapy Weekly Progress Note  Patient Details  Name: Dakota [name] MRN: [id number] Date of Birth: 1928-11-29  Beginning of progress report period: December 14, 2017 End of progress report period: December 21, 2017  Today's Date: 12/21/2017 OT Individual Time: 4580-9983 OT Individual Time Calculation (min): 48 min    Patient has met 2 of 5 short term goals.  Pt is making steady progress with therapy but continues to fluctuate with progress depending on attention level.  He continues at times to demonstrate fatigue and sleepiness during the day secondary to being restless at night.  At these times he needs overall mod to max assist for transfers and LB selfcare sit to stand as well as UB dressing.  However, when he is rested and alert he has the ability to complete all UB bathing with supervision as well as UB dressing with min assist.  LB bathing and dressing can be completed with mod assist overall.  Stand pivot transfers to the toilet and the walk-in shower can be completed at min assist as well.  He continues to demonstrate motor planning, coordination, and sensory deficits in the LUE with functional use.  Frequently he tries to use the LUE in non-dominant fashion but drops the washcloth he is holding or misses placement of his hand when attempting to grasp and hold something such as the grab bar.  He continues with left neglect as well, preferring to maintain right head turn most of the time.  Feel he is on target to meet most of his current LTGs with anticipated discharge 8/27.  Will continue with current OT POC and continue to provide family education with his son, who will be providing 24 hour assist as needed.    Patient continues to demonstrate the following deficits: muscle weakness, impaired timing and sequencing, unbalanced muscle activation, motor apraxia, decreased coordination and decreased motor planning, decreased midline orientation, decreased attention to left, left side  neglect and decreased motor planning, decreased attention, decreased awareness, decreased problem solving, decreased safety awareness, decreased memory and delayed processing and decreased sitting balance, decreased standing balance, decreased postural control, hemiplegia and decreased balance strategies and therefore will continue to benefit from skilled OT intervention to enhance overall performance with BADL and Reduce care partner burden.  Patient progressing toward long term goals..  Continue plan of care.  OT Short Term Goals Week 3:  OT Short Term Goal 1 (Week 3): Continue working on established LTGs set at min to [employer].   Skilled Therapeutic Interventions/Progress Updates:    Pt's son was present for session so used opportunity to work on functional transfers stand pivot to the toilet in the room as well as the tub shower in the ADL apartment during session.  Pt was able to complete stand pivot transfer to the left to the toilet with mod assist and mod instructional cueing for sequencing and hand position.  He was able to transfer back to the wheelchair with min assist to the right, but tends to sit down slightly early to the left side of the wheelchair, which he needs to scoot again for re-adjustment.  Pt's son was able to safely assist him to the toilet and back to the chair as well.  Next had pt complete transfer stand pivot to the tub bench with mod assist to the left and min assist to the right.  Discussed placement of safety grab bar at home as well and pt's son may install one.  He was also able to  demonstrate safe completion of tub transfer to the bench and back to the wheelchair.  Finished session with return to the room and transfer to the bed with min assist stand pivot.    Therapy Documentation Precautions:  Precautions Precautions: Fall Precaution Comments: right gaze, left hemiparesis, left lean /pushing Restrictions Weight Bearing Restrictions: No  Pain: Pain  Assessment Pain Scale: 0-10 Pain Score: 0-No pain ADL: See Function Navigator for Current Functional Status.   Therapy/Group: Individual Therapy  Yaelis Scharfenberg OTR/L 12/21/2017, 4:37 PM

## 2017-12-21 NOTE — Progress Notes (Signed)
Speech Language Pathology Daily Session Note  Patient Details  Name: Dakota [name] MRN: [id number] Date of Birth: [birth year]  Today's Date: 12/21/2017 SLP Individual Time: 1100-1155 SLP Individual Time Calculation (min): 55 min  Short Term Goals: Week 3: SLP Short Term Goal 1 (Week 3): Pt will consume therapeutic trials of thin liquids with minimal overt s/s of aspiration and min cues for use of swallowing strategies.  SLP Short Term Goal 2 (Week 3): Pt will complete basic familiar problem solving tasks related to ADLs with Min A cues.  SLP Short Term Goal 3 (Week 3): Pt will sustain attention to basic, familiar tasks for ~10 minute intervals with mod assist multimodal cues.  SLP Short Term Goal 4 (Week 3): Pt will slow rate and increase vocal intensity to achieve ~75%  intelligibility at the phrase level with mod assist.   SLP Short Term Goal 5 (Week 3): Pt will locate items to the left of midline during basic familiar tasks in ~50% of opportunities with mod assist multimodal cues.   Skilled Therapeutic Interventions:  Pt was seen for skilled ST targeting dysphagia and cognition goals.  Pt's son was at bedside and appeared frustrated, reporting that pt had a restless night last night and has been drowsy.  Pt would briefly open his eyes in response to therapist's voice and would answer therapist's questions; however, his verbalizations were incomprehensible due to fatigue.  SLP initiated education with pt's son regarding rationale for pt's current diet recommendations given his fluctuating mental status and alertness.  SLP also demonstrated how to thicken liquids to the appropriate viscosity.  SLP discussed how pt's swallowing safety will be impacted by his alertness and attention and told son of signs to look out for that may indicate a developing pneumonia.   As session progressed, pt became more alert and would engage in personally meaningful conversations with therapist and demonstrated much  clearer thought organization and improved intelligibility.  Pt consumed ~4 oz of nectar thick liquids without overt s/s of aspiration.  Thin liquids were not trialed today due to lethargy.  Pt was left in wheelchair with son at bedside.  All questions were answered to their satisfaction at this time.  Continue per current plan of care.    Function:  Eating Eating   Modified Consistency Diet: Yes Eating Assist Level: Supervision or verbal cues           Cognition Comprehension Comprehension assist level: Understands basic 50 - 74% of the time/ requires cueing 25 - 49% of the time  Expression   Expression assist level: Expresses basic 50 - 74% of the time/requires cueing 25 - 49% of the time. Needs to repeat parts of sentences.  Social Interaction Social Interaction assist level: Interacts appropriately 25 - 49% of time - Needs frequent redirection.  Problem Solving Problem solving assist level: Solves basic 25 - 49% of the time - needs direction more than half the time to initiate, plan or complete simple activities  Memory Memory assist level: Recognizes or recalls 25 - 49% of the time/requires cueing 50 - 75% of the time    Pain Pain Assessment Pain Scale: 0-10 Pain Score: 0-No pain   Therapy/Group: Individual Therapy  Darus Hershman, Selinda Orion 12/21/2017, 12:47 PM

## 2017-12-21 NOTE — Progress Notes (Signed)
Physical Therapy Session Note  Patient Details  Name: Dakota [name] MRN: [id number] Date of Birth: Dec 20, 1928  Today's Date: 12/21/2017 PT Individual Time: 1300-1400 PT Individual Time Calculation (min): 60 min   Short Term Goals: Week 3:  PT Short Term Goal 1 (Week 3): Patient to be able to perform functional bed mobility with MinA on 2/3 attempts  PT Short Term Goal 2 (Week 3): Patient to be able to perform stand-pivot transfers with MinA on 2/3 attempts  PT Short Term Goal 3 (Week 3): Family to demonstrate correct mechanics for bed mobility and stand-pivot transfers  PT Short Term Goal 4 (Week 3): Family to be able to verbalize importance of pressure relief and positioning strategies for use at home   Skilled Therapeutic Interventions/Progress Updates:    Pt received seated in w/c in room, agreeable to PT. No complaints of pain. Pt's son Laurey Arrow present during therapy session. Manual w/c propulsion x 50 ft with BUE with mod A for steering as pt veers to the L despite v/c and hand-over-hand assist for LUE propulsion. Squat pivot transfer w/c to/from level mat table with min to mod A, increased time needed to complete transfers. Ambulation 2 x 60 ft with rail in hallway and HHA, w/c follow for safety. Pt exhibits fair ability to advance LLE and weight shift L/R with max cues needed. Ambulation x 60 ft with RW and mod A for balance, decreased LLE clearance and B step length. Fwd/backward amb 2 x 5 ft with RW and max cues for sequencing. Pt becomes frustrated with therapy tasks and keeps asking if he can go home and drive his truck. Pt redirected and reoriented to situation, shows poor insight into purpose of therapy and safety at home. Seated BLE therex x 10 reps. Pt left seated in w/c in room with needs in reach and son present.  Therapy Documentation Precautions:  Precautions Precautions: Fall Precaution Comments: right gaze, left hemiparesis, left lean /pushing Restrictions Weight Bearing  Restrictions: No  See Function Navigator for Current Functional Status.   Therapy/Group: Individual Therapy  Excell Seltzer, PT, DPT  12/21/2017, 4:23 PM

## 2017-12-21 NOTE — Progress Notes (Signed)
Occupational Therapy Session Note  Patient Details  Name: Dakota [name] MRN: [id number] Date of Birth: May 01, 1929  Today's Date: 12/21/2017 OT Individual Time: 0037-0488 OT Individual Time Calculation (min): 58 min    Short Term Goals: Week 2:  OT Short Term Goal 1 (Week 2): Pt completed UB selfcare sitting with min assist.  OT Short Term Goal 2 (Week 2): Pt will complete LB bathing min assist sit to stand.   OT Short Term Goal 3 (Week 2): Pt will complete LB dressing sit to stand with mod assist. OT Short Term Goal 4 (Week 2): Pt will complete toilet transfers and shower transfers stand pivot with min assist.   OT Short Term Goal 5 (Week 2): Pt will locate grooming and bathing items left of midline with no more and min instructional cueing.  Skilled Therapeutic Interventions/Progress Updates:    Pt asleep to start session but woke up easily once sitting EOB with mod assist.  Mod assist initially for static sitting balance secondary to LOB posteriorly.  Mod assist for stand pivot transfer to the wheelchair and from the wheelchair to the tub bench.  He needed mod assist for bathing sit to stand with max instructional cueing for sequencing.  Pt with frequent dropping of the washcloth in the left hand without awareness.  Still with increased lean to the left when reaching down to the LEs.  Mod assist for transfer back to the wheelchair for dressing.  Max assist for LB dressing secondary to perceptual deficits and motor planning.  He needed min assist with mod demonstrational cueing for donning pullover shirt.  Finished session with pt in the wheelchair and pt's son at his side.    Therapy Documentation Precautions:  Precautions Precautions: Fall Precaution Comments: right gaze, left hemiparesis, left lean /pushing Restrictions Weight Bearing Restrictions: No   Pain: Pain Assessment Pain Scale: Faces Faces Pain Scale: Hurts a little bit Pain Type: Chronic pain Pain Location: Back Pain  Orientation: Lower Pain Descriptors / Indicators: Discomfort Pain Onset: With Activity Pain Intervention(s): Repositioned ADL: See Function Navigator for Current Functional Status.   Therapy/Group: Individual Therapy  Brevyn Ring OTR/L 12/21/2017, 12:27 PM

## 2017-12-21 NOTE — Progress Notes (Signed)
[place] PHYSICAL MEDICINE & REHABILITATION     PROGRESS NOTE  Subjective/Complaints:  Patient seen lying in bed this morning. She slept on and off for sleep chart. Per patient, he states he slept fairly well.  ROS: Limited due to cognition.   Objective: Vital Signs: Blood pressure 128/79, pulse 98, temperature 98.3 F (36.8 C), temperature source Oral, resp. rate 19, height 5\' 7"  (1.702 m), weight 74.8 kg, SpO2 94 %. No results found. Recent Labs    12/18/17 1113  WBC 11.6*  HGB 12.8*  HCT 41.1  PLT 233   Recent Labs    12/18/17 1113  NA 140  K 4.2  CL 106  GLUCOSE 107*  BUN 21  CREATININE 0.88  CALCIUM 9.2   CBG (last 3)  No results for input(s): GLUCAP in the last 72 hours.  Wt Readings from Last 3 Encounters:  12/21/17 74.8 kg  12/04/17 78.1 kg  11/21/17 81.7 kg    Physical Exam:  BP 128/79 (BP Location: Right Arm)   Pulse 98   Temp 98.3 F (36.8 C) (Oral)   Resp 19   Ht 5\' 7"  (1.702 m)   Wt 74.8 kg   SpO2 94%   BMI 25.83 kg/m  Constitutional: No distress . Vital signs reviewed. HENT: Normocephalic.  Atraumatic. Eyes: EOMI. No discharge. Cardiovascular: irregularly irregular. No JVD. Respiratory: CTA bilaterally. Normal effort. GI: BS +. Non-distended. Musc: No edema or tenderness in extremities. Neurological: Alert and Oriented x2 HOH Dysarhtria Motor:  Motor: Right upper extremity: 5/5 proximal distal Left upper extremity: 4+/5 proximal to distal (stable) Bilateral lower extremities: Hip flexion 4-/5, knee extension 4-/5, ankle dorsiflexion 4+/5(?participation, stable) Left inattention improved today Skin: Skin is warm and dry.  Psychiatric: impaired memory  Assessment/Plan: 1. Functional deficits secondary to right MCA infarct with GI bleed which require 3+ hours per day of interdisciplinary therapy in a comprehensive inpatient rehab setting. Physiatrist is providing close team supervision and 24 hour management of active medical  problems listed below. Physiatrist and rehab team continue to assess barriers to discharge/monitor patient progress toward functional and medical goals.  Function:  Bathing Bathing position   Position: Sitting EOB  Bathing parts Body parts bathed by patient: Right arm, Left arm, Chest, Abdomen, Right upper leg, Left upper leg, Right lower leg, Left lower leg Body parts bathed by helper: Front perineal area, Buttocks, Back  Bathing assist Assist Level: Touching or steadying assistance(Pt > 75%)      Upper Body Dressing/Undressing Upper body dressing   What is the patient wearing?: Pull over shirt/dress     Pull over shirt/dress - Perfomed by patient: Thread/unthread right sleeve, Put head through opening Pull over shirt/dress - Perfomed by helper: Thread/unthread left sleeve, Pull shirt over trunk        Upper body assist Assist Level: Touching or steadying assistance(Pt > 75%)      Lower Body Dressing/Undressing Lower body dressing   What is the patient wearing?: Pants, Shoes, [employer]- Performed by patient: Thread/unthread right pants leg, Thread/unthread left pants leg Pants- Performed by helper: Pull pants up/down Non-skid slipper socks- Performed by patient: Don/doff right sock, Don/doff left sock Non-skid slipper socks- Performed by helper: Don/doff right sock, Don/doff left sock     Shoes - Performed by patient: Don/doff right shoe Shoes - Performed by helper: Don/doff left shoe       TED Hose - Performed by helper: Don/doff right TED hose, Don/doff left TED hose  Lower body assist Assist for lower body dressing: Touching or steadying assistance (Pt > 75%)      Toileting Toileting Toileting activity did not occur: No continent bowel/bladder event Toileting steps completed by patient: Adjust clothing prior to toileting Toileting steps completed by helper: Adjust clothing prior to toileting, Performs perineal hygiene, Adjust clothing after  toileting Toileting Assistive Devices: Grab bar or rail  Toileting assist Assist level: Two helpers   Transfers Chair/bed transfer   Chair/bed transfer [place]: Stand pivot Chair/bed transfer assist level: Moderate assist (Pt 50 - 74%/lift or lower) Chair/bed transfer assistive device: Armrests Mechanical lift: Ecologist     Max distance: 20 Assist level: Moderate assist (Pt 50 - 74%)   Wheelchair   Type: Manual Max wheelchair distance: 50 Assist Level: Moderate assistance (Pt 50 - 74%)  Cognition Comprehension Comprehension assist level: Understands basic 25 - 49% of the time/ requires cueing 50 - 75% of the time  Expression Expression assist level: Expresses basic 25 - 49% of the time/requires cueing 50 - 75% of the time. Uses single words/gestures.  Social Interaction Social Interaction assist level: Interacts appropriately 25 - 49% of time - Needs frequent redirection.  Problem Solving Problem solving assist level: Solves basic 25 - 49% of the time - needs direction more than half the time to initiate, plan or complete simple activities  Memory Memory assist level: Recognizes or recalls 25 - 49% of the time/requires cueing 50 - 75% of the time    Medical Problem List and Plan: 1.  Right lean, left-sided weakness,dysphagia, cognitive deficits and difficulty standing secondary to right MCA infarct with GI bleed.  Continue CIR - per son pt does better with cognition "once he steps out of hospital", allow grounds pass with family if pt is alert and not agitated 2.  DVT Prophylaxis/Anticoagulation: Mechanical:  Antiembolism stockings, knee (TED hose) Bilateral lower extremities 3. Pain Management: tylenol prn 4. Mood: LCSW to follow for evaluation and support as needed.  5. Neuropsych: This patient is not capable of making decisions on his own behalf.  Will consider trial Amantadine 100  6. Skin/Wound Care: routine pressure relief measures.  7.  Fluids/Electrolytes/Nutrition: Monitor I/O.   On D1 nectars, will likely to go home on this 8. ABLA due to duodenal ulcer/GIB:   Monitor H/H serial checks.   Increased protonix to 40 mg bid.  Hemoglobin 12.8 on 8/19 9.  Chronic A fib: Monitor HR bid. No anticoagulation for now. No medications due to hypotension.  10. Delirium/sundowning:ongoing issue added mirtazepine, changed restoril to prn, DC'd on 8/19, will give remeron at 8 pm  -Continue Depakote tid    Seroquel DC'd on 8/8 due to oversedation, trazodone ineffective, restoril ?ineffective  Depakote level subtherapeutic--maintain same dose- no seizure, decreased on 8/19, decreased again on 3/22  Melatonin started on 8/20, increased on 8/22 11. Constipation  Increased senna to bid. 12. Prediabetes  Reasonable control 13. Hypoalbuminemia  Supplement initiated on 8/6, po intake improved 14. Blood pressure  -reasonable control 8/16 15. Leukocytosis  WBCs 11.6 on 8/19  Afebrile  UA negative, urine culture negative  Chest x-ray reviewed, unremarkable for acute infectious process    LOS (Days) 17 A FACE TO FACE EVALUATION WAS PERFORMED  Bently Morath [name] 12/21/2017 9:03 AM

## 2017-12-22 ENCOUNTER — Inpatient Hospital Stay (HOSPITAL_COMMUNITY): Payer: Medicare Other | Admitting: Occupational Therapy

## 2017-12-22 ENCOUNTER — Inpatient Hospital Stay (HOSPITAL_COMMUNITY): Payer: Medicare Other | Admitting: Speech Pathology

## 2017-12-22 ENCOUNTER — Inpatient Hospital Stay (HOSPITAL_COMMUNITY): Payer: Medicare Other | Admitting: Physical Therapy

## 2017-12-22 DIAGNOSIS — R350 Frequency of micturition: Secondary | ICD-10-CM

## 2017-12-22 MED ORDER — FAMOTIDINE 20 MG PO TABS
40.0000 mg | ORAL_TABLET | Freq: Two times a day (BID) | ORAL | Status: DC
  Administered 2017-12-22 – 2017-12-26 (×9): 40 mg via ORAL
  Filled 2017-12-22 (×9): qty 2

## 2017-12-22 MED ORDER — DARIFENACIN HYDROBROMIDE ER 7.5 MG PO TB24
7.5000 mg | ORAL_TABLET | Freq: Every day | ORAL | Status: DC
  Administered 2017-12-22 – 2017-12-25 (×4): 7.5 mg via ORAL
  Filled 2017-12-22 (×4): qty 1

## 2017-12-22 NOTE — Progress Notes (Signed)
Occupational Therapy Session Note  Patient Details  Name: Dakota [name] MRN: [id number] Date of Birth: March 07, 1929  Today's Date: 12/22/2017 OT Individual Time: 0902-1004 OT Individual Time Calculation (min): 62 min    Short Term Goals: Week 3:  OT Short Term Goal 1 (Week 3): Continue working on established LTGs set at min to [employer].   Skilled Therapeutic Interventions/Progress Updates:    Pt worked on bathing and dressing sit to stand at the sink.  Mod instructional cueing for sequencing with min assist for sit to stand from the wheelchair.  Min assist for bathing with mod assist for donning pullover shirt and max assist for donning pants and gripper socks.  Pt able to use the LUE at a diminished level with occasional dropping of the washcloth, but pt more aware of this during session.  Finished session with call button in reach and pt's son sitting at bedside.    Therapy Documentation Precautions:  Precautions Precautions: Fall Precaution Comments: right gaze, left hemiparesis, left lean /pushing Restrictions Weight Bearing Restrictions: No  Pain: Pain Assessment Pain Score: 0-No pain ADL: See Function Navigator for Current Functional Status.   Therapy/Group: Individual Therapy  Ivan Lacher OTR/L 12/22/2017, 12:29 PM

## 2017-12-22 NOTE — Progress Notes (Signed)
Occupational Therapy Session Note  Patient Details  Name: Dakota [name] MRN: [id number] Date of Birth: 1929/01/07  Today's Date: 12/22/2017 OT Individual Time: 1751-0258 OT Individual Time Calculation (min): 44 min    Short Term Goals: Week 3:  OT Short Term Goal 1 (Week 3): Continue working on established LTGs set at min to [employer].   Skilled Therapeutic Interventions/Progress Updates:    Pt worked on standing balance while engaged in Advertising account executive shoes during session.  Had pt standing with min to mod assist to reach to both the left and right to retrieve horse shoes and to toss them to target.  Increased posterior lean at times with LOB posteriorly, requiring mod assist to correct.  He was able to demonstrate greater midline orientation in standing with less pushing to the left side.  When reaching to the right and up he would exhibit good weightshift for grasping horse shoe.  Had him also reach down low to retrieve them as well.  When reaching with the LUE he was able to grasp them efficiently without dropping them 95% of the time, and was even able to toss a few using the LUE.  Had him ambulated and bend down to pick up a couple of them with mod assist for balance and to regain standing balance.  Still with increased dragging of the LLE noted during mobility.  Finished session with return to the room with call button and phone in reach and pt returning to bed.  Pt's son present throughout session.    Therapy Documentation Precautions:  Precautions Precautions: Fall Precaution Comments: right gaze, left hemiparesis, left lean /pushing Restrictions Weight Bearing Restrictions: No  Pain: Pain Assessment Pain Scale: Faces Pain Score: 0-No pain ADL: See Function Navigator for Current Functional Status.   Therapy/Group: Individual Therapy  Jermarion Poffenberger OTR/L 12/22/2017, 3:42 PM

## 2017-12-22 NOTE — Progress Notes (Signed)
Speech Language Pathology Daily Session Note  Patient Details  Name: Dakota [name] MRN: [id number] Date of Birth: [birth year]  Today's Date: 12/22/2017 SLP Individual Time: 2263-3354 SLP Individual Time Calculation (min): 45 min  Short Term Goals: Week 3: SLP Short Term Goal 1 (Week 3): Pt will consume therapeutic trials of thin liquids with minimal overt s/s of aspiration and min cues for use of swallowing strategies.  SLP Short Term Goal 2 (Week 3): Pt will complete basic familiar problem solving tasks related to ADLs with Min A cues.  SLP Short Term Goal 3 (Week 3): Pt will sustain attention to basic, familiar tasks for ~10 minute intervals with mod assist multimodal cues.  SLP Short Term Goal 4 (Week 3): Pt will slow rate and increase vocal intensity to achieve ~75%  intelligibility at the phrase level with mod assist.   SLP Short Term Goal 5 (Week 3): Pt will locate items to the left of midline during basic familiar tasks in ~50% of opportunities with mod assist multimodal cues.   Skilled Therapeutic Interventions:  Pt was seen for skilled ST targeting goals for dysphagia and cognition.  Pt was awake and alert upon therapist's arrival.  RN reported that pt slept very well last night.  Pt thought he might have to use the restroom and was transferred to commode via [place] lift.  During transfer, pt needed mod-max cues for safety to correct left lateral lean while seated in the lift due to decreased awareness of diminished postural control.   By the time pt was seated on the commode, he no longer had the urge to void.  Pt was transferred to chair for breakfast meal.  Pt consumed pureed textures and nectar thick liquids with min cues for use of swallowing precautions to correct anterior loss of boluses.  No overt s/s of aspiration were evident with solids or liquids.  Pt needed min-mod cues periodically throughout meal to locate items on the left side of his meal tray.     Function:  Eating Eating   Modified Consistency Diet: Yes Eating Assist Level: Supervision or verbal cues;Help managing cup/glass;Help with picking up utensils   Eating Set Up Assist For: Opening containers Helper Scoops Food on Utensil: Occasionally Helper Brings Food to Mouth: Occasionally   Cognition Comprehension Comprehension assist level: Understands basic 50 - 74% of the time/ requires cueing 25 - 49% of the time  Expression   Expression assist level: Expresses basic 25 - 49% of the time/requires cueing 50 - 75% of the time. Uses single words/gestures.  Social Interaction Social Interaction assist level: Interacts appropriately 50 - 74% of the time - May be physically or verbally inappropriate.  Problem Solving Problem solving assist level: Solves basic 25 - 49% of the time - needs direction more than half the time to initiate, plan or complete simple activities  Memory Memory assist level: Recognizes or recalls 25 - 49% of the time/requires cueing 50 - 75% of the time    Pain Pain Assessment Pain Scale: 0-10 Pain Score: 0-No pain  Therapy/Group: Individual Therapy  Mazi Schuff, Selinda Orion 12/22/2017, 12:54 PM

## 2017-12-22 NOTE — Progress Notes (Signed)
Social Work Patient ID: Dakota [name], male   DOB: 05-30-1928, 84 y.o.   MRN: [id number]   Have reviewed conference and d/c plans with pt/son.  Son reports that he is hopeful that pt's cognition will improve once he is back home and in a familiar setting.  Discussed plan for [facility] follow up.  Continue to follow.  [name], LCSW

## 2017-12-22 NOTE — Progress Notes (Addendum)
[place] [facility] MEDICINE & REHABILITATION     PROGRESS NOTE  Subjective/Complaints:  In lying bed this morning. He slept well overnight. Discussed with nursing also states patient slept well overnight. Patient appears to be restless only when he needs to urinate.  ROS: Limited due to cognition, but appears to deny CP, SOB, nausea, vomiting, diarrhea..   Objective: Vital Signs: Blood pressure (!) 124/95, pulse 81, temperature 97.7 F (36.5 C), temperature source Oral, resp. rate 16, height 5\' 7"  (1.702 m), weight 75.1 kg, SpO2 98 %. No results found. No results for input(s): WBC, HGB, HCT, PLT in the last 72 hours. No results for input(s): NA, K, CL, GLUCOSE, BUN, CREATININE, CALCIUM in the last 72 hours.  Invalid input(s): CO CBG (last 3)  No results for input(s): GLUCAP in the last 72 hours.  Wt Readings from Last 3 Encounters:  12/22/17 75.1 kg  12/04/17 78.1 kg  11/21/17 81.7 kg    Physical Exam:  BP (!) 124/95 (BP Location: Left Arm)   Pulse 81   Temp 97.7 F (36.5 C) (Oral)   Resp 16   Ht 5\' 7"  (1.702 m)   Wt 75.1 kg   SpO2 98%   BMI 25.93 kg/m  Constitutional: No distress . Vital signs reviewed. HENT: Normocephalic.  Atraumatic. Eyes: EOMI. No discharge. Cardiovascular: irregularly irregular. No JVD. Respiratory: CTA bilaterally. Normal effort. GI: BS +. Non-distended. Musc: No edema or tenderness in extremities. Neurological: Alert and Oriented x2 HOH Dysarhtria Motor:  Motor: Right upper extremity: 5/5 proximal distal Left upper extremity: 4+/5 proximal to distal (unchanged) Bilateral lower extremities: Hip flexion 4-/5, knee extension 4-/5, ankle dorsiflexion 4+/5(?participation, unchanged) Left inattention improving Skin: Skin is warm and dry.  Psychiatric: impaired memory  Assessment/Plan: 1. Functional deficits secondary to right MCA infarct with GI bleed which require 3+ hours per day of interdisciplinary therapy in a comprehensive inpatient  rehab setting. Physiatrist is providing close team supervision and 24 hour management of active medical problems listed below. Physiatrist and rehab team continue to assess barriers to discharge/monitor patient progress toward functional and medical goals.  Function:  Bathing Bathing position   Position: Shower  Bathing parts Body parts bathed by patient: Right arm, Left arm, Chest, Abdomen, Right upper leg, Left upper leg, Right lower leg, Left lower leg Body parts bathed by helper: Front perineal area, Buttocks, Back  Bathing assist Assist Level: Touching or steadying assistance(Pt > 75%)      Upper Body Dressing/Undressing Upper body dressing   What is the patient wearing?: Pull over shirt/dress     Pull over shirt/dress - Perfomed by patient: Thread/unthread right sleeve, Thread/unthread left sleeve, Put head through opening Pull over shirt/dress - Perfomed by helper: Pull shirt over trunk        Upper body assist Assist Level: Touching or steadying assistance(Pt > 75%)      Lower Body Dressing/Undressing Lower body dressing   What is the patient wearing?: Pants, Shoes, [employer]- Performed by patient: Thread/unthread right pants leg, Thread/unthread left pants leg Pants- Performed by helper: Pull pants up/down Non-skid slipper socks- Performed by patient: Don/doff right sock, Don/doff left sock Non-skid slipper socks- Performed by helper: Don/doff right sock, Don/doff left sock     Shoes - Performed by patient: Don/doff right shoe Shoes - Performed by helper: Don/doff left shoe, Fasten right       TED Hose - Performed by helper: Don/doff right TED hose, Don/doff left TED hose  Lower body assist Assist for lower body dressing: Touching or steadying assistance (Pt > 75%)      Toileting Toileting Toileting activity did not occur: No continent bowel/bladder event Toileting steps completed by patient: Adjust clothing prior to toileting Toileting steps  completed by helper: Adjust clothing prior to toileting, Performs perineal hygiene, Adjust clothing after toileting Toileting Assistive Devices: Grab bar or rail  Toileting assist Assist level: Two helpers   Transfers Chair/bed transfer   Chair/bed transfer [place]: Squat pivot Chair/bed transfer assist level: Moderate assist (Pt 50 - 74%/lift or lower) Chair/bed transfer assistive device: Armrests Mechanical lift: Stedy   Locomotion Ambulation     Max distance: 67' Assist level: Moderate assist (Pt 50 - 74%)   Wheelchair   Type: Manual Max wheelchair distance: 67' Assist Level: Moderate assistance (Pt 50 - 74%)  Cognition Comprehension Comprehension assist level: Understands basic 25 - 49% of the time/ requires cueing 50 - 75% of the time  Expression Expression assist level: Expresses basic 25 - 49% of the time/requires cueing 50 - 75% of the time. Uses single words/gestures.  Social Interaction Social Interaction assist level: Interacts appropriately 25 - 49% of time - Needs frequent redirection.  Problem Solving Problem solving assist level: Solves basic 25 - 49% of the time - needs direction more than half the time to initiate, plan or complete simple activities  Memory Memory assist level: Recognizes or recalls 25 - 49% of the time/requires cueing 50 - 75% of the time    Medical Problem List and Plan: 1.  Right lean, left-sided weakness,dysphagia, cognitive deficits and difficulty standing secondary to right MCA infarct with GI bleed.  Continue CIR - per son pt does better with cognition "once he steps out of hospital", allow grounds pass with family if pt is alert and not agitated 2.  DVT Prophylaxis/Anticoagulation: Mechanical:  Antiembolism stockings, knee (TED hose) Bilateral lower extremities 3. Pain Management: tylenol prn 4. Mood: LCSW to follow for evaluation and support as needed.  5. Neuropsych: This patient is not capable of making decisions on his own behalf.  Will  consider trial Amantadine 100 if necessary 6. Skin/Wound Care: routine pressure relief measures.  7. Fluids/Electrolytes/Nutrition: Monitor I/O.   On D1 nectars, will likely to go home on this 8. ABLA due to duodenal ulcer/GIB:   Monitor H/H serial checks.   Increased protonix to 40 mg bid, changed to pepcid on 8/23.  Hemoglobin 12.8 on 8/19 9.  Chronic A fib: Monitor HR bid. No anticoagulation for now. No medications due to hypotension.  10. Delirium/sundowning:ongoing issue added mirtazepine, changed restoril to prn, DC'd on 8/19, will give remeron at 8 pm  -Continue Depakote tid    Seroquel DC'd on 8/8 due to oversedation, trazodone ineffective, restoril ?ineffective  Depakote level subtherapeutic--maintain same dose- no seizure, decreased on 8/19, decreased again on 3/22  Melatonin started on 8/20, increased on 8/22  Slept well on 8/22 11. Constipation  Increased senna to bid. 12. Prediabetes  Reasonable control 13. Hypoalbuminemia  Supplement initiated on 8/6, po intake improved 14. Blood pressure  -reasonable control 8/16 15. Leukocytosis  WBCs 11.6 on 8/19  Afebrile  UA negative, urine culture negative  Chest x-ray reviewed, unremarkable for acute infectious process 16. Urinary frequency  PVRs borderline, but appears to be restless due to urination at night  Will add    LOS (Days) 18 A FACE TO FACE EVALUATION WAS PERFORMED  Ankit [name] 12/22/2017 8:00 AM

## 2017-12-22 NOTE — Progress Notes (Signed)
Physical Therapy Session Note  Patient Details  Name: Dakota [name] MRN: [id number] Date of Birth: [birth year]  Today's Date: 12/22/2017 PT Individual Time: 1100-1200 PT Individual Time Calculation (min): 60 min   Short Term Goals: Week 3:  PT Short Term Goal 1 (Week 3): Patient to be able to perform functional bed mobility with MinA on 2/3 attempts  PT Short Term Goal 2 (Week 3): Patient to be able to perform stand-pivot transfers with MinA on 2/3 attempts  PT Short Term Goal 3 (Week 3): Family to demonstrate correct mechanics for bed mobility and stand-pivot transfers  PT Short Term Goal 4 (Week 3): Family to be able to verbalize importance of pressure relief and positioning strategies for use at home   Skilled Therapeutic Interventions/Progress Updates:    Pt received seated in w/c in room, agreeable to PT. No complaints of pain. Pt's son Laurey Arrow present during therapy session. Stand pivot transfer w/c to/from mat table with min to mod A. Pt's son is able to perform transfer with patient safely with minor cues for hand placement during transfer. Ambulation 2 x 30 ft with RW and mod A for balance, decreased B step length and decreased LLE clearance. Pt requires mod cues during gait to keep RW closer to body. Pt requires max cueing when turning to sit in chair with RW. Pt tries to walk all the way around behind the chair he is attempting to sit in and when questioned as to why he does that instead of sitting down in chair pt reports he does not know. Pt exhibits poor awareness of L hemibody and needs max verbal and tactile cues with gait and with turns for safe RW management. Stand pivot transfer w/c to/from Nust[place] with mod A. Nustep level 5 x 7 min with B UE/LE, pt is able to keep LUE on handgrip for first 5 min then needs mod A to keep LUE in place. Pt left seated in w/c in room with needs in reach, son present.  Therapy Documentation Precautions:  Precautions Precautions: Fall Precaution  Comments: right gaze, left hemiparesis, left lean /pushing Restrictions Weight Bearing Restrictions: No  See Function Navigator for Current Functional Status.   Therapy/Group: Individual Therapy  Excell Seltzer, PT, DPT  12/22/2017, 12:16 PM

## 2017-12-23 ENCOUNTER — Inpatient Hospital Stay (HOSPITAL_COMMUNITY): Payer: Medicare Other

## 2017-12-23 NOTE — Progress Notes (Signed)
[facility]YSICAL MEDICINE & REHABILITATION     PROGRESS NOTE  Subjective/Complaints:  Patient seen laying in bed this AM.  He slept well overnight with decreased urination per nursing.    ROS: Limited due to cognition, but appears to deny CP, SOB, nausea, vomiting, diarrhea..   Objective: Vital Signs: Blood pressure 103/67, pulse 99, temperature 97.8 F (36.6 C), temperature source Oral, resp. rate 15, height 5\' 7"  (1.702 m), weight 76.1 kg, SpO2 98 %. No results found. No results for input(s): WBC, HGB, HCT, PLT in the last 72 hours. No results for input(s): NA, K, CL, GLUCOSE, BUN, CREATININE, CALCIUM in the last 72 hours.  Invalid input(s): CO CBG (last 3)  No results for input(s): GLUCAP in the last 72 hours.  Wt Readings from Last 3 Encounters:  12/23/17 76.1 kg  12/04/17 78.1 kg  11/21/17 81.7 kg    Physical Exam:  BP 103/67 (BP Location: Right Arm)   Pulse 99   Temp 97.8 F (36.6 C) (Oral)   Resp 15   Ht 5\' 7"  (1.702 m)   Wt 76.1 kg   SpO2 98%   BMI 26.28 kg/m  Constitutional: No distress . Vital signs reviewed. HENT: Normocephalic.  Atraumatic. Eyes: EOMI. No discharge. Cardiovascular: irregularly irregular. No JVD. Respiratory: CTA bilaterally. Normal effort. GI: BS +. Non-distended. Musc: No edema or tenderness in extremities. Neurological: Alert and Oriented x2 HOH Dysarhtria Motor:  Motor: Right upper extremity: 5/5 proximal distal Left upper extremity: 4+/5 proximal to distal (stable) Bilateral lower extremities: Hip flexion 4-/5, knee extension 4-/5, ankle dorsiflexion 4+/5(?participation, stable) Left inattention improving Skin: Skin is warm and dry.  Psychiatric: impaired memory  Assessment/Plan: 1. Functional deficits secondary to right MCA infarct with GI bleed which require 3+ hours per day of interdisciplinary therapy in a comprehensive inpatient rehab setting. Physiatrist is providing close team supervision and 24 hour management of  active medical problems listed below. Physiatrist and rehab team continue to assess barriers to discharge/monitor patient progress toward functional and medical goals.  Function:  Bathing Bathing position   Position: Wheelchair/chair at sink  Bathing parts Body parts bathed by patient: Right arm, Left arm, Chest, Abdomen, Right upper leg, Left upper leg, Right lower leg, Left lower leg, Front perineal area, Buttocks Body parts bathed by helper: Back  Bathing assist Assist Level: Touching or steadying assistance(Pt > 75%)      Upper Body Dressing/Undressing Upper body dressing   What is the patient wearing?: Pull over shirt/dress     Pull over shirt/dress - Perfomed by patient: Thread/unthread right sleeve Pull over shirt/dress - Perfomed by helper: Thread/unthread left sleeve, Put head through opening        Upper body assist Assist Level: Touching or steadying assistance(Pt > 75%)      Lower Body Dressing/Undressing Lower body dressing   What is the patient wearing?: Pants, Shoes, [employer]- Performed by patient: Thread/unthread left pants leg Pants- Performed by helper: Thread/unthread right pants leg, Pull pants up/down Non-skid slipper socks- Performed by patient: Don/doff right sock, Don/doff left sock Non-skid slipper socks- Performed by helper: Don/doff right sock, Don/doff left sock     Shoes - Performed by patient: Don/doff right shoe Shoes - Performed by helper: Don/doff left shoe, Fasten right, Fasten left       TED Hose - Performed by helper: Don/doff right TED hose, Don/doff left TED hose  Lower body assist Assist for lower body dressing: Touching or steadying assistance (  Pt > 75%)      Toileting Toileting Toileting activity did not occur: No continent bowel/bladder event Toileting steps completed by patient: Adjust clothing prior to toileting Toileting steps completed by helper: Adjust clothing prior to toileting, Performs perineal hygiene,  Adjust clothing after toileting Toileting Assistive Devices: Grab bar or rail  Toileting assist Assist level: Two helpers   Transfers Chair/bed transfer   Chair/bed transfer [place]: Stand pivot Chair/bed transfer assist level: Moderate assist (Pt 50 - 74%/lift or lower) Chair/bed transfer assistive device: Armrests Mechanical lift: Stedy   Locomotion Ambulation     Max distance: 30' Assist level: Moderate assist (Pt 50 - 74%)   Wheelchair   Type: Manual Max wheelchair distance: 39' Assist Level: Moderate assistance (Pt 50 - 74%)  Cognition Comprehension Comprehension assist level: Understands basic 50 - 74% of the time/ requires cueing 25 - 49% of the time  Expression Expression assist level: Expresses basic 25 - 49% of the time/requires cueing 50 - 75% of the time. Uses single words/gestures.  Social Interaction Social Interaction assist level: Interacts appropriately 50 - 74% of the time - May be physically or verbally inappropriate.  Problem Solving Problem solving assist level: Solves basic 25 - 49% of the time - needs direction more than half the time to initiate, plan or complete simple activities  Memory Memory assist level: Recognizes or recalls 25 - 49% of the time/requires cueing 50 - 75% of the time    Medical Problem List and Plan: 1.  Right lean, left-sided weakness,dysphagia, cognitive deficits and difficulty standing secondary to right MCA infarct with GI bleed.  Continue CIR - per son pt does better with cognition "once he steps out of hospital", allow grounds pass with family if pt is alert and not agitated 2.  DVT Prophylaxis/Anticoagulation: Mechanical:  Antiembolism stockings, knee (TED hose) Bilateral lower extremities 3. Pain Management: tylenol prn 4. Mood: LCSW to follow for evaluation and support as needed.  5. Neuropsych: This patient is not capable of making decisions on his own behalf.  Will consider trial Amantadine 100 if necessary 6. Skin/Wound  Care: routine pressure relief measures.  7. Fluids/Electrolytes/Nutrition: Monitor I/O.   On D1 nectars, will likely to go home on this 8. ABLA due to duodenal ulcer/GIB:   Monitor H/H serial checks.   Increased protonix to 40 mg bid, changed to pepcid on 8/23.  Hemoglobin 12.8 on 8/19  Labs ordered for Monday 9.  Chronic A fib: Monitor HR bid. No anticoagulation for now. No medications due to hypotension.  10. Delirium/sundowning:ongoing issue added mirtazepine, changed restoril to prn, DC'd on 8/19, will give remeron at 8 pm  -Continue Depakote tid    Seroquel DC'd on 8/8 due to oversedation, trazodone ineffective, restoril ?ineffective  Depakote level subtherapeutic--maintain same dose- no seizure, decreased on 8/19, decreased again on 3/22  Melatonin started on 8/20, increased on 8/22  Improving 11. Constipation  Increased senna to bid. 12. Prediabetes  Reasonable control 13. Hypoalbuminemia  Supplement initiated on 8/6, po intake improved 14. Blood pressure  -reasonable control 8/16 15. Leukocytosis  WBCs 11.6 on 8/19  Labs ordered for Monday  Afebrile  UA negative, urine culture negative  Chest x-ray reviewed, unremarkable for acute infectious process 16. Urinary frequency  PVRs borderline, but appears to be restless due to urination at night  Added Enablex 7/5 on 8/23    LOS (Days) 19 A FACE TO FACE EVALUATION WAS PERFORMED  Ankit [name] 12/23/2017 5:19 PM

## 2017-12-23 NOTE — Progress Notes (Signed)
Physical Therapy Session Note  Patient Details  Name: Dakota [name] MRN: [id number] Date of Birth: 10-02-1928  Today's Date: 12/23/2017 PT Individual Time: 1300-1400 PT Individual Time Calculation (min): 60 min   Short Term Goals:  Week 3:  PT Short Term Goal 1 (Week 3): Patient to be able to perform functional bed mobility with MinA on 2/3 attempts  PT Short Term Goal 2 (Week 3): Patient to be able to perform stand-pivot transfers with MinA on 2/3 attempts  PT Short Term Goal 3 (Week 3): Family to demonstrate correct mechanics for bed mobility and stand-pivot transfers  PT Short Term Goal 4 (Week 3): Family to be able to verbalize importance of pressure relief and positioning strategies for use at home   Skilled Therapeutic Interventions/Progress Updates:  Pt dozing but awakened easily.  He was alert throughout session.    Family ed with son Abe People for bed mobility, simulated car transfer, stand pivot transfers to L and R mat>< w/c, w/c positioning, w/c propulsion over level tile, gait, L attention. PT added [name] to safety plan, checked off for basic transfers. Abe People would benefit from further training for bed mobility, w/c positioning, and ambulation with pt.  After simulated car transfer, pt stated that his low back hurt from pulling his LEs in.    Gait training with RW x 75' with 1 turn to R, with min assist except for turn to R, mod assist, for steering RW, preventing over- shift to L, mod cues for upright trunk and forward gaze.  Pt left resting in w/c with seat belt alarm set, and [name] in room.     Therapy Documentation Precautions:  Precautions Precautions: Fall Precaution Comments: right gaze, left hemiparesis, left lean /pushing Restrictions Weight Bearing Restrictions: No  Pain: at rest- pt denies       See Function Navigator for Current Functional Status.   Therapy/Group: Individual Therapy  Adara Kittle 12/23/2017, 5:37 PM

## 2017-12-24 ENCOUNTER — Inpatient Hospital Stay (HOSPITAL_COMMUNITY): Payer: Medicare Other | Admitting: Physical Therapy

## 2017-12-24 ENCOUNTER — Inpatient Hospital Stay (HOSPITAL_COMMUNITY): Payer: Medicare Other

## 2017-12-24 NOTE — Progress Notes (Signed)
Occupational Therapy Session Note  Patient Details  Name: Dakota [name] MRN: [id number] Date of Birth: [birth year]  Today's Date: 12/24/2017 OT Individual Time: 1500-1600 OT Individual Time Calculation (min): 60 min    Short Term Goals: Week 3:  OT Short Term Goal 1 (Week 3): Continue working on established LTGs set at min to [employer].   Skilled Therapeutic Interventions/Progress Updates:    1;1. Pt with no c/o pain. Pt agreeable to changing clothing this session. Pt retrieves all clothing from L with min VC for locating. Pt unable to orient shirt, but able to orient pants to self. Pt requires step by step cueing for sequencing, however recalls threading L extremities first. Pt unable to problem solve reorientation of pants when thread LE into wrong pant leg. Pt requires HOH A to don socks. Overall min A for donning shirt and pants. Exited session with pt seated in bed, call light in reach and all needs met  Therapy Documentation Precautions:  Precautions Precautions: Fall Precaution Comments: right gaze, left hemiparesis, left lean /pushing Restrictions Weight Bearing Restrictions: No General:   See Function Navigator for Current Functional Status.   Therapy/Group: Individual Therapy  Tonny Branch 12/24/2017, 5:23 PM

## 2017-12-24 NOTE — Progress Notes (Signed)
[facility] PHYSICAL MEDICINE & REHABILITATION     PROGRESS NOTE  Subjective/Complaints:  Pt seen lying in bed this AM.  He slept fairly well overnight per sleep chart.    ROS: Limited due to cognition, but appears to deny CP, SOB, nausea, vomiting, diarrhea..  Objective: Vital Signs: Blood pressure 114/80, pulse (!) 113, temperature 97.8 F (36.6 C), temperature source Oral, resp. rate 16, height 5\' 7"  (1.702 m), weight 77 kg, SpO2 97 %. No results found. No results for input(s): WBC, HGB, HCT, PLT in the last 72 hours. No results for input(s): NA, K, CL, GLUCOSE, BUN, CREATININE, CALCIUM in the last 72 hours.  Invalid input(s): CO CBG (last 3)  No results for input(s): GLUCAP in the last 72 hours.  Wt Readings from Last 3 Encounters:  12/24/17 77 kg  12/04/17 78.1 kg  11/21/17 81.7 kg    Physical Exam:  BP 114/80 (BP Location: Right Arm)   Pulse (!) 113   Temp 97.8 F (36.6 C) (Oral)   Resp 16   Ht 5\' 7"  (1.702 m)   Wt 77 kg   SpO2 97%   BMI 26.59 kg/m  Constitutional: No distress . Vital signs reviewed. HENT: Normocephalic.  Atraumatic. Eyes: EOMI. No discharge. Cardiovascular: Irregularly irregular. No JVD. Respiratory: CTA bilaterally. Normal effort. GI: BS +. Non-distended. Musc: No edema or tenderness in extremities. Neurological: Alert and Oriented x2 HOH Dysarhtria Motor:  Motor:  Left upper extremity: 4+/5 proximal to distal (unchanged) Left lower extremities: Hip flexion 4-/5, knee extension 4-/5, ankle dorsiflexion 4+/5(?participation, unchanged) Left inattention improving Skin: Skin is warm and dry.  Psychiatric: impaired memory  Assessment/Plan: 1. Functional deficits secondary to right MCA infarct with GI bleed which require 3+ hours per day of interdisciplinary therapy in a comprehensive inpatient rehab setting. Physiatrist is providing close team supervision and 24 hour management of active medical problems listed below. Physiatrist and rehab  team continue to assess barriers to discharge/monitor patient progress toward functional and medical goals.  Function:  Bathing Bathing position   Position: Wheelchair/chair at sink  Bathing parts Body parts bathed by patient: Right arm, Left arm, Chest, Abdomen, Right upper leg, Left upper leg, Right lower leg, Left lower leg, Front perineal area, Buttocks Body parts bathed by helper: Back  Bathing assist Assist Level: Touching or steadying assistance(Pt > 75%)      Upper Body Dressing/Undressing Upper body dressing   What is the patient wearing?: Pull over shirt/dress     Pull over shirt/dress - Perfomed by patient: Thread/unthread right sleeve Pull over shirt/dress - Perfomed by helper: Thread/unthread left sleeve, Put head through opening        Upper body assist Assist Level: Touching or steadying assistance(Pt > 75%)      Lower Body Dressing/Undressing Lower body dressing   What is the patient wearing?: Pants, Shoes, [employer]- Performed by patient: Thread/unthread left pants leg Pants- Performed by helper: Thread/unthread right pants leg, Pull pants up/down Non-skid slipper socks- Performed by patient: Don/doff right sock, Don/doff left sock Non-skid slipper socks- Performed by helper: Don/doff right sock, Don/doff left sock     Shoes - Performed by patient: Don/doff right shoe Shoes - Performed by helper: Don/doff left shoe, Fasten right, Fasten left       TED Hose - Performed by helper: Don/doff right TED hose, Don/doff left TED hose  Lower body assist Assist for lower body dressing: Touching or steadying assistance (Pt > 75%)  Toileting Toileting Toileting activity did not occur: No continent bowel/bladder event Toileting steps completed by patient: Adjust clothing prior to toileting Toileting steps completed by helper: Adjust clothing prior to toileting, Performs perineal hygiene, Adjust clothing after toileting Toileting Assistive Devices:  Grab bar or rail  Toileting assist Assist level: Two helpers   Transfers Chair/bed transfer   Chair/bed transfer [place]: Stand pivot Chair/bed transfer assist level: Touching or steadying assistance (Pt > 75%) Chair/bed transfer assistive device: Armrests, Walker Mechanical lift: Ecologist     Max distance: 125 Assist level: Touching or steadying assistance (Pt > 75%)   Wheelchair   Type: Manual Max wheelchair distance: 50 Assist Level: Moderate assistance (Pt 50 - 74%)  Cognition Comprehension Comprehension assist level: Understands basic 25 - 49% of the time/ requires cueing 50 - 75% of the time  Expression Expression assist level: Expresses basic 25 - 49% of the time/requires cueing 50 - 75% of the time. Uses single words/gestures.  Social Interaction Social Interaction assist level: Interacts appropriately 25 - 49% of time - Needs frequent redirection.  Problem Solving Problem solving assist level: Solves basic 25 - 49% of the time - needs direction more than half the time to initiate, plan or complete simple activities  Memory Memory assist level: Recognizes or recalls 25 - 49% of the time/requires cueing 50 - 75% of the time    Medical Problem List and Plan: 1.  Right lean, left-sided weakness,dysphagia, cognitive deficits and difficulty standing secondary to right MCA infarct with GI bleed.  Continue CIR - per son pt does better with cognition "once he steps out of hospital", allow grounds pass with family if pt is alert and not agitated 2.  DVT Prophylaxis/Anticoagulation: Mechanical:  Antiembolism stockings, knee (TED hose) Bilateral lower extremities 3. Pain Management: tylenol prn 4. Mood: LCSW to follow for evaluation and support as needed.  5. Neuropsych: This patient is not capable of making decisions on his own behalf.  Will consider trial Amantadine 100 if necessary 6. Skin/Wound Care: routine pressure relief measures.  7.  Fluids/Electrolytes/Nutrition: Monitor I/O.   On D1 nectars, will likely to go home on this 8. ABLA due to duodenal ulcer/GIB:   Monitor H/H serial checks.   Increased protonix to 40 mg bid, changed to pepcid on 8/23.  Hemoglobin 12.8 on 8/19  Labs ordered for tomorrow 9.  Chronic A fib: Monitor HR bid. No anticoagulation for now. No medications due to hypotension.  10. Delirium/sundowning:ongoing issue added mirtazepine, changed restoril to prn, DC'd on 8/19, will give remeron at 8 pm  Seroquel DC'd on 8/8 due to oversedation, trazodone ineffective, restoril ?ineffective  Depakote level subtherapeutic--maintain same dose- no seizure, decreased on 8/19, decreased again on 3/22  Melatonin started on 8/20, increased on 8/22  Improving 11. Constipation  Increased senna to bid. 12. Prediabetes  Reasonable control 13. Hypoalbuminemia  Supplement initiated on 8/6, po intake improved 14. Blood pressure  Labile on 8/25 15. Leukocytosis  WBCs 11.6 on 8/19  Labs ordered for tomorrow  Afebrile  UA negative, urine culture negative  Chest x-ray reviewed, unremarkable for acute infectious process 16. Urinary frequency  PVRs borderline, but appears to be restless due to urination at night  Added Enablex 7/5 on 8/23  Improving    LOS (Days) 20 A FACE TO FACE EVALUATION WAS PERFORMED  Maddi Collar [name] 12/24/2017 3:54 PM

## 2017-12-24 NOTE — Progress Notes (Signed)
Physical Therapy Session Note  Patient Details  Name: Dakota [name] MRN: [id number] Date of Birth: 01/09/1929  Today's Date: 12/24/2017 PT Individual Time: 1230-1300 PT Individual Time Calculation (min): 30 min   Short Term Goals: Week 3:  PT Short Term Goal 1 (Week 3): Patient to be able to perform functional bed mobility with MinA on 2/3 attempts  PT Short Term Goal 2 (Week 3): Patient to be able to perform stand-pivot transfers with MinA on 2/3 attempts  PT Short Term Goal 3 (Week 3): Family to demonstrate correct mechanics for bed mobility and stand-pivot transfers  PT Short Term Goal 4 (Week 3): Family to be able to verbalize importance of pressure relief and positioning strategies for use at home   Skilled Therapeutic Interventions/Progress Updates: Pt received seated in w/c with son present; denies pain and agreeable to treatment. Transported to gym [place]. Gait x125' with minA fade to modA as pt fatigued and increased L lateral lean with poor L foot clearance/placement. Standing LLE toe taps to 3" step with maxA initially, faded to min as pt begins to initiate R weight shift with less assist from therapist. Attempted on 6" step however pt demo's stronger L lateral/posterior lean on second trial. Standing balance on red foam wedge with minA for gastroc lengthening, closed chain dorsiflexion facilitation for righting reactions. Gait to return to room minA faded to [place] as pt fatigued. Pt became emotionally labile stating he was upset that he got his son's name wrong; therapist and son provided emotional support. Remained in w/c, chair alarm intact, all needs in reach at completion of session, son present.      Therapy Documentation Precautions:  Precautions Precautions: Fall Precaution Comments: right gaze, left hemiparesis, left lean /pushing Restrictions Weight Bearing Restrictions: No General:   Pain:    See Function Navigator for Current Functional Status.   Therapy/Group:  Individual Therapy  Corliss Skains 12/24/2017, 1:05 PM

## 2017-12-25 ENCOUNTER — Inpatient Hospital Stay (HOSPITAL_COMMUNITY): Payer: Medicare Other | Admitting: Speech Pathology

## 2017-12-25 ENCOUNTER — Inpatient Hospital Stay (HOSPITAL_COMMUNITY): Payer: Medicare Other | Admitting: Physical Therapy

## 2017-12-25 ENCOUNTER — Inpatient Hospital Stay (HOSPITAL_COMMUNITY): Payer: Medicare Other

## 2017-12-25 ENCOUNTER — Inpatient Hospital Stay (HOSPITAL_COMMUNITY): Payer: Medicare Other | Admitting: Occupational Therapy

## 2017-12-25 LAB — BASIC METABOLIC PANEL
Anion gap: 11 (ref 5–15)
BUN: 28 mg/dL — ABNORMAL HIGH (ref 8–23)
CALCIUM: 9.5 mg/dL (ref 8.9–10.3)
CHLORIDE: 106 mmol/L (ref 98–111)
CO2: 25 mmol/L (ref 22–32)
Creatinine, Ser: 1.08 mg/dL (ref 0.61–1.24)
Glucose, Bld: 105 mg/dL — ABNORMAL HIGH (ref 70–99)
Potassium: 4.3 mmol/L (ref 3.5–5.1)
SODIUM: 142 mmol/L (ref 135–145)

## 2017-12-25 LAB — CBC WITH DIFFERENTIAL/PLATELET
Abs Immature Granulocytes: 0.1 10*3/uL (ref 0.0–0.1)
BASOS ABS: 0.1 10*3/uL (ref 0.0–0.1)
BASOS PCT: 1 %
Eosinophils Absolute: 0.5 10*3/uL (ref 0.0–0.7)
Eosinophils Relative: 4 %
HCT: 44.7 % (ref 39.0–52.0)
Hemoglobin: 13.9 g/dL (ref 13.0–17.0)
IMMATURE GRANULOCYTES: 1 %
Lymphocytes Relative: 30 %
Lymphs Abs: 3.7 10*3/uL (ref 0.7–4.0)
MCH: 29 pg (ref 26.0–34.0)
MCHC: 31.1 g/dL (ref 30.0–36.0)
MCV: 93.3 fL (ref 78.0–100.0)
Monocytes Absolute: 1.6 10*3/uL — ABNORMAL HIGH (ref 0.1–1.0)
Monocytes Relative: 13 %
NEUTROS PCT: 51 %
Neutro Abs: 6.3 10*3/uL (ref 1.7–7.7)
PLATELETS: 285 10*3/uL (ref 150–400)
RBC: 4.79 MIL/uL (ref 4.22–5.81)
RDW: 13.4 % (ref 11.5–15.5)
WBC: 12.2 10*3/uL — AB (ref 4.0–10.5)

## 2017-12-25 NOTE — Progress Notes (Addendum)
Occupational Therapy Discharge Summary  Patient Details  Name: Dakota [name] MRN: [id number] Date of Birth: Aug 03, 1928  Today's Date: 12/25/2017 OT Individual Time: 1324-4010 OT Individual Time Calculation (min): 59 min    Patient has met 14 of 16 long term goals due to improved activity tolerance, improved balance, postural control, ability to compensate for deficits, functional use of  LEFT upper extremity, improved attention, improved awareness and improved coordination.  Patient to discharge at [facility] Assist level.  Patient's son  is independent to provide the necessary physical and cognitive assistance at discharge.  Pt has made great progress in his ADL transfers, L UE control, and sitting/standing balance. Pt's son has demonstrated the ability to assist the pt in ADL transfers and b/d tasks at the level of care provided.   Reasons goals not met: Pt still requires multimodal cueing for problem solving through dressing tasks, midline orientation, and task initiation/sequencing.   Recommendation:  Patient will benefit from ongoing skilled OT services in home health setting to continue to advance functional skills in the area of BADL and Reduce care partner burden.  Equipment: Rw, w/c Family already has TTB.   Reasons for discharge: treatment goals met and discharge from hospital  Patient/family agrees with progress made and goals achieved: Yes   OT Skilled Treatment Pt received supine in bed agreeable to therapy with no c/o pain. Session focused on d/c planning and b/d tasks. Pt transitioned to EOB with mod A. Tactile cues provided for pt to complete UB bathing. Min A required to problem solve through donning shirt. Pt able to thread B LE through pants with min A, and pull up in standing with min A. Pt stood from EOB with CGA with RW, requiring multimodal cueing for self correction of posture. Edu provided to pt's son (caregiver) throughout session re tips for maintaining body  mechanics during transfers and ADLs, as well as energy conservation techniques. Pt performed 2x stand pivot transfers with (S) this session. Pt completed tub transfer with TTB, requiring heavy multimodal cueing for sequencing transfer but able to complete with min A overall once it "clicked" with pt. Pt was returned to room and left siting up in recliner with son present.   OT Discharge Precautions/Restrictions  Precautions Precautions: Fall Precaution Comments: right gaze, left hemiparesis, left lean /pushing Restrictions Weight Bearing Restrictions: No Pain Pain Assessment Pain Scale: 0-10 Pain Score: 0-No pain ADL ADL ADL Comments: See functional navigator Vision Baseline Vision/History: No visual deficits Eye Alignment: Impaired (comment)(R gaze) Ocular Range of Motion: Within Functional Limits Alignment/Gaze Preference: Gaze right;Head turned Tracking/Visual Pursuits: Decreased smoothness of vertical tracking;Decreased smoothness of horizontal tracking Saccades: Decreased speed of saccadic movement;Additional head turns occurred during testing Visual Fields: No apparent deficits Perception  Perception: Impaired Inattention/Neglect: Does not attend to left visual field Spatial Orientation: poor midline awareness Comments: L neglect Praxis Praxis: Impaired Praxis Impairment Details: Motor planning;Initiation;Ideation Praxis-Other Comments: heavy cues for planning, initiation and sequencing Cognition Overall Cognitive Status: Impaired/Different from baseline Arousal/Alertness: Lethargic Orientation Level: Oriented to person;Disoriented to place;Disoriented to time;Disoriented to situation Attention: Sustained Focused Attention: Appears intact Focused Attention Impairment: Verbal basic;Functional basic Sustained Attention: Impaired Sustained Attention Impairment: Verbal basic;Functional basic Memory: Impaired Memory Impairment: Decreased short term memory Decreased Short  Term Memory: Verbal basic Awareness: Impaired Awareness Impairment: Intellectual impairment Problem Solving: Impaired Problem Solving Impairment: Verbal basic;Functional basic Behaviors: Perseveration Safety/Judgment: Impaired Comments: left inattention  Sensation Sensation Light Touch: Impaired Detail Central sensation comments: difficult to assess 2/2 cognition/communication  Proprioception: Impaired by gross assessment Stereognosis: Impaired by gross assessment Coordination Gross Motor Movements are Fluid and Coordinated: No Fine Motor Movements are Fluid and Coordinated: No Coordination and Movement Description: L hemiparesis Motor  Motor Motor: Hemiplegia;Abnormal postural alignment and control Motor - Skilled Clinical Observations: reduced proprioception and sequencing L LE  Mobility  Bed Mobility Bed Mobility: Supine to Sit;Sit to Supine;Rolling Right Rolling Right: Minimal Assistance - Patient > 75% Supine to Sit: Minimal Assistance - Patient > 75% Sit to Supine: Minimal Assistance - Patient > 75% Transfers Sit to Stand: Contact Guard/Touching assist Stand to Sit: Contact Guard/Touching assist  Trunk/Postural Assessment  Cervical Assessment Cervical Assessment: Exceptions to WFL(forward head, L tendency) Thoracic Assessment Thoracic Assessment: Exceptions to WFL(kyphotic) Lumbar Assessment Lumbar Assessment: Exceptions to WFL(posterior pelvic tilt) Postural Control Postural Control: Deficits on evaluation Trunk Control: Tendency for L lean in sitting and standing, tactile/visual/verbal cues to correct  Righting Reactions: severely limited Protective Responses: severely limited  Balance Balance Balance Assessed: Yes Static Sitting Balance Static Sitting - Balance Support: Feet supported;No upper extremity supported Static Sitting - Level of Assistance: 5: Stand by assistance;4: Min assist(can fluctuate between SBA and min A) Dynamic Sitting Balance Dynamic  Sitting - Balance Support: Feet supported Dynamic Sitting - Level of Assistance: 3: Mod assist Dynamic Sitting - Balance Activities: Reaching for objects Sitting balance - Comments: Pt can exhibit a strong L lean during tasks, but can self correct with vc Static Standing Balance Static Standing - Balance Support: Bilateral upper extremity supported;During functional activity Static Standing - Level of Assistance: 4: Min assist Dynamic Standing Balance Dynamic Standing - Balance Support: During functional activity;Bilateral upper extremity supported Dynamic Standing - Level of Assistance: 4: Min assist;3: Mod assist Dynamic Standing - Balance Activities: Reaching for objects Dynamic Standing - Comments: Can flucuate depending on arousal  Extremity/Trunk Assessment RUE Assessment RUE Assessment: Within Functional Limits LUE Assessment LUE Assessment: Exceptions to [facility] Passive Range of Motion (PROM) Comments: WFL Active Range of Motion (AROM) Comments: WFL- poor propioception  General Strength Comments: 3+/5 grasp LUE Body System: Neuro Brunstrum levels for arm and hand: Arm;Hand Brunstrum level for arm: Stage V Relative Independence from Synergy Brunstrum level for hand: Stage V Independence from basic synergies LUE Tone LUE Tone: Within Functional Limits   See Function Navigator for Current Functional Status.  Curtis Sites 12/25/2017, 12:18 PM

## 2017-12-25 NOTE — Progress Notes (Signed)
Speech Language Pathology Discharge Summary  Patient Details  Name: Dakota [name] MRN: [id number] Date of Birth: [birth year]  Today's Date: 12/25/2017 SLP Individual Time: 1000-1055 SLP Individual Time Calculation (min): 55 min   Skilled Therapeutic Interventions:  Skilled treatment session focused in completion of patient and family education. SLP facilitated session by providing resourses to the patient's family in regards to websites/stores that have pre-thickened and pureed items to minimize the strain of meal preparation as well as discussing overt s/s of aspiration, symptoms of PNA and utilization of a suction for oral care (CSW to order). SLP also reinforced the procedures of the water protocol. Education was also provided to the patient's son in regards to patient's current cognitive function and strategies to utilize at home to maximize safety, especially the importance of 24 hour supervision. Handouts were also given to reinforce information. Patient's son verbalized understanding of all information. Patient left upright in recliner with all needs within reach and son present. Continue with current plan of care.   Patient has met 7 of 7 long term goals.  Patient to discharge at overall Mod;Min level.   Reasons goals not met: N/A   Clinical Impression/Discharge Summary: Patient has made minimal and slow progress and has met 7 of 7 LTG's this admission. Currently, patient is consuming Dys. 1 textures with nectar-thick liquids with minimal overt s/s of aspiration and Min A verbal cues for use of swallowing compensatory strategies. Patient is also consuming thin liquids via the water protocol to maximize utilization of small sips. Patient is ~75% intelligible at the phrase level with Min A verbal cues for use of speech intelligibility strategies and overall Mod-Max A multimodal cues for attention and basic problem solving with familiar tasks. Patient and family education is complete and patient  will discharge home with 24 hour supervision from family. Patient would benefit from f/u SLP services to maximize his cognitive and swallowing function in order to reduce caregiver burden.   Care Partner:  Caregiver Able to Provide Assistance: Yes  Type of Caregiver Assistance: Physical;Cognitive  Recommendation:  Home Health SLP;24 hour supervision/assistance  Rationale for SLP Follow Up: Maximize functional communication;Reduce caregiver burden;Maximize swallowing safety;Maximize cognitive function and independence   Equipment: N/A   Reasons for discharge: Discharged from hospital   Patient/Family Agrees with Progress Made and Goals Achieved: Yes   Function:   Cognition Comprehension Comprehension assist level: Understands basic 50 - 74% of the time/ requires cueing 25 - 49% of the time  Expression   Expression assist level: Expresses basic 25 - 49% of the time/requires cueing 50 - 75% of the time. Uses single words/gestures.  Social Interaction Social Interaction assist level: Interacts appropriately 25 - 49% of time - Needs frequent redirection.  Problem Solving Problem solving assist level: Solves basic 25 - 49% of the time - needs direction more than half the time to initiate, plan or complete simple activities  Memory Memory assist level: Recognizes or recalls 25 - 49% of the time/requires cueing 50 - 75% of the time   Danh Bayus 12/25/2017, 3:50 PM

## 2017-12-25 NOTE — Progress Notes (Signed)
Dakota [name] PHYSICAL MEDICINE & REHABILITATION     PROGRESS NOTE  Subjective/Complaints:  Patient seen laying in bed this morning. He slept fairly well overnight for sleep chart.  ROS: Limited due to cognition, but appears to deny CP, SOB, nausea, vomiting, diarrhea..  Objective: Vital Signs: Blood pressure 125/70, pulse 93, temperature 97.7 F (36.5 C), temperature source Oral, resp. rate 16, height 5\' 7"  (1.702 m), weight 74.2 kg, SpO2 97 %. No results found. No results for input(s): WBC, HGB, HCT, PLT in the last 72 hours. No results for input(s): NA, K, CL, GLUCOSE, BUN, CREATININE, CALCIUM in the last 72 hours.  Invalid input(s): CO CBG (last 3)  No results for input(s): GLUCAP in the last 72 hours.  Wt Readings from Last 3 Encounters:  12/25/17 74.2 kg  12/04/17 78.1 kg  11/21/17 81.7 kg    Physical Exam:  BP 125/70   Pulse 93   Temp 97.7 F (36.5 C) (Oral)   Resp 16   Ht 5\' 7"  (1.702 m)   Wt 74.2 kg   SpO2 97%   BMI 25.62 kg/m  Constitutional: No distress . Vital signs reviewed. HENT: Normocephalic.  Atraumatic. Eyes: EOMI. No discharge. Cardiovascular: irregularly irregular. No JVD. Respiratory: CTA bilaterally. Normal effort. GI: BS +. Non-distended. Musc: No edema or tenderness in extremities. Neurological: Alert and Oriented x2 HOH Dysarhtria Motor:  Motor:  Left upper extremity: 4+/5 proximal to distal (stable) Left lower extremities: Hip flexion 4-/5, knee extension 4-/5, ankle dorsiflexion 4+/5(stable) Left inattention improving Skin: Skin is warm and dry.  Psychiatric: impaired memory  Assessment/Plan: 1. Functional deficits secondary to right MCA infarct with GI bleed which require 3+ hours per day of interdisciplinary therapy in a comprehensive inpatient rehab setting. Physiatrist is providing close team supervision and 24 hour management of active medical problems listed below. Physiatrist and rehab team continue to assess barriers to  discharge/monitor patient progress toward functional and medical goals.  Function:  Bathing Bathing position   Position: Wheelchair/chair at sink  Bathing parts Body parts bathed by patient: Right arm, Left arm, Chest, Abdomen, Right upper leg, Left upper leg, Right lower leg, Left lower leg, Front perineal area, Buttocks Body parts bathed by helper: Back  Bathing assist Assist Level: Touching or steadying assistance(Pt > 75%)      Upper Body Dressing/Undressing Upper body dressing   What is the patient wearing?: Pull over shirt/dress     Pull over shirt/dress - Perfomed by patient: Thread/unthread right sleeve Pull over shirt/dress - Perfomed by helper: Thread/unthread left sleeve, Put head through opening        Upper body assist Assist Level: Touching or steadying assistance(Pt > 75%)      Lower Body Dressing/Undressing Lower body dressing   What is the patient wearing?: Pants, Shoes, [employer]- Performed by patient: Thread/unthread left pants leg Pants- Performed by helper: Thread/unthread right pants leg, Pull pants up/down Non-skid slipper socks- Performed by patient: Don/doff right sock, Don/doff left sock Non-skid slipper socks- Performed by helper: Don/doff right sock, Don/doff left sock     Shoes - Performed by patient: Don/doff right shoe Shoes - Performed by helper: Don/doff left shoe, Fasten right, Fasten left       TED Hose - Performed by helper: Don/doff right TED hose, Don/doff left TED hose  Lower body assist Assist for lower body dressing: Touching or steadying assistance (Pt > 75%)      Toileting Toileting Toileting activity did not occur:  No continent bowel/bladder event Toileting steps completed by patient: Adjust clothing prior to toileting Toileting steps completed by helper: Adjust clothing prior to toileting, Performs perineal hygiene, Adjust clothing after toileting Toileting Assistive Devices: Grab bar or rail  Toileting assist  Assist level: Two helpers   Transfers Chair/bed transfer   Chair/bed transfer [place]: Stand pivot Chair/bed transfer assist level: Touching or steadying assistance (Pt > 75%) Chair/bed transfer assistive device: Armrests, Walker Mechanical lift: Ecologist     Max distance: 125 Assist level: Touching or steadying assistance (Pt > 75%)   Wheelchair   Type: Manual Max wheelchair distance: 50 Assist Level: Moderate assistance (Pt 50 - 74%)  Cognition Comprehension Comprehension assist level: Understands basic 25 - 49% of the time/ requires cueing 50 - 75% of the time  Expression Expression assist level: Expresses basic 25 - 49% of the time/requires cueing 50 - 75% of the time. Uses single words/gestures.  Social Interaction Social Interaction assist level: Interacts appropriately 25 - 49% of time - Needs frequent redirection.  Problem Solving Problem solving assist level: Solves basic 25 - 49% of the time - needs direction more than half the time to initiate, plan or complete simple activities  Memory Memory assist level: Recognizes or recalls 25 - 49% of the time/requires cueing 50 - 75% of the time    Medical Problem List and Plan: 1.  Right lean, left-sided weakness,dysphagia, cognitive deficits and difficulty standing secondary to right MCA infarct with GI bleed.  Continue CIR - per son pt does better with cognition "once he steps out of hospital", allow grounds pass with family if pt is alert and not agitated  Plan for d/c tomorrow  Will see patient for transitional care management in 1-2 weeks post-discharge 2.  DVT Prophylaxis/Anticoagulation: Mechanical:  Antiembolism stockings, knee (TED hose) Bilateral lower extremities 3. Pain Management: tylenol prn 4. Mood: LCSW to follow for evaluation and support as needed.  5. Neuropsych: This patient is not capable of making decisions on his own behalf. 6. Skin/Wound Care: routine pressure relief measures.  7.  Fluids/Electrolytes/Nutrition: Monitor I/O.   On D1 nectars, will go home on this 8. ABLA due to duodenal ulcer/GIB:   Monitor H/H serial checks.   Increased protonix to 40 mg bid, changed to pepcid on 8/23.  Hemoglobin 12.8 on 8/19  Labs pending 9.  Chronic A fib: Monitor HR bid. No anticoagulation for now. No medications due to hypotension.  10. Delirium/sundowning:ongoing issue added mirtazepine, changed restoril to prn, DC'd on 8/19, will give remeron at 8 pm  Seroquel DC'd on 8/8 due to oversedation, trazodone ineffective, restoril ?ineffective  Depakote level subtherapeutic--maintain same dose- no seizure, decreased on 8/19, decreased again on 3/22  Melatonin started on 8/20, increased on 8/22  improved 11. Constipation  Increased senna to bid. 12. Prediabetes  Reasonable control 13. Hypoalbuminemia  Supplement initiated on 8/6, po intake improved 14. Blood pressure  Labile on 8/25 15. Leukocytosis  WBCs 11.6 on 8/19  Labs pending  Afebrile  UA negative, urine culture negative  Chest x-ray reviewed, unremarkable for acute infectious process 16. Urinary frequency  PVRs borderline, but appears to be restless due to urination at night  Added Enablex 7/5 on 8/23  Improving    LOS (Days) 21 A FACE TO FACE EVALUATION WAS PERFORMED  Dakota [name] [name] 12/25/2017 6:37 AM

## 2017-12-25 NOTE — Progress Notes (Signed)
Physical Therapy Session Note  Patient Details  Name: Dakota [name] MRN: [id number] Date of Birth: 06-Oct-1928  Today's Date: 12/25/2017 PT Individual Time: 1110-1205 PT Individual Time Calculation (min): 55 min   Short Term Goals: Week 3:  PT Short Term Goal 1 (Week 3): Patient to be able to perform functional bed mobility with MinA on 2/3 attempts  PT Short Term Goal 2 (Week 3): Patient to be able to perform stand-pivot transfers with MinA on 2/3 attempts  PT Short Term Goal 3 (Week 3): Family to demonstrate correct mechanics for bed mobility and stand-pivot transfers  PT Short Term Goal 4 (Week 3): Family to be able to verbalize importance of pressure relief and positioning strategies for use at home   Skilled Therapeutic Interventions/Progress Updates: Pt presented in recliner with son present agreeable to therapy. Pt performed stand pivot with RW with minA and mod verbal cues for sequencing. Pt transported to ortho gym and participated in car transfer with son. Pt was able to perform with minA and mod verbal cues for sequencing and safety. Pt returned to w/c and transported to ADL apt where pt performed bed mobility on standard bed. Provided son with education on verbal cues for sequencing with pt required minA for sit to supine and modA for truncal support for supine to sit. Pt was able to perform stand pivot transfer with PTA with minA w/c<>bed. Pt transported to rehab gym and practiced gait training approx 187ft with RW. This session pt noted to have improved posture and required mod/max verbal cues for increasing LLE step length and clearance. Pt did note to have increased L lean and becoming increasingly distracted with fatigue. Pt then participated in ascending x 1 step and performing toe taps with min/modA.      Therapy Documentation Precautions:  Precautions Precautions: Fall Precaution Comments: right gaze, left hemiparesis, left lean /pushing Restrictions Weight Bearing  Restrictions: No General:   Vital Signs:   Pain: Pain Assessment Pain Scale: 0-10 Pain Score: 0-No pain Mobility: Bed Mobility Bed Mobility: Supine to Sit;Sit to Supine;Rolling Right Rolling Right: Minimal Assistance - Patient > 75% Supine to Sit: Minimal Assistance - Patient > 75% Sit to Supine: Minimal Assistance - Patient > 75% Transfers Transfers: Sit to Stand;Stand to Sit Sit to Stand: Contact Guard/Touching assist Stand to Sit: Contact Guard/Touching assist Stand Pivot Transfers: Contact Guard/Touching assist Stand Pivot Transfer Details: Visual cues/gestures for precautions/safety;Tactile cues for initiation;Verbal cues for precautions/safety;Verbal cues for sequencing;Verbal cues for technique;Tactile cues for sequencing Locomotion :    Trunk/Postural Assessment : Cervical Assessment Cervical Assessment: Exceptions to WFL(forward head, L tendency) Thoracic Assessment Thoracic Assessment: Exceptions to WFL(kyphotic) Lumbar Assessment Lumbar Assessment: Exceptions to WFL(posterior pelvic tilt) Postural Control Postural Control: Deficits on evaluation Trunk Control: Tendency for L lean in sitting and standing, tactile/visual/verbal cues to correct  Righting Reactions: severely limited Protective Responses: severely limited  Balance: Balance Balance Assessed: Yes Static Sitting Balance Static Sitting - Balance Support: Feet supported;No upper extremity supported Static Sitting - Level of Assistance: 5: Stand by assistance;4: Min assist(can fluctuate between SBA and min A) Dynamic Sitting Balance Dynamic Sitting - Balance Support: Feet supported Dynamic Sitting - Level of Assistance: 3: Mod assist Dynamic Sitting - Balance Activities: Reaching for objects Sitting balance - Comments: Pt can exhibit a strong L lean during tasks, but can self correct with vc Static Standing Balance Static Standing - Balance Support: Bilateral upper extremity supported;During functional  activity Static Standing - Level of Assistance: 4: Min  assist Dynamic Standing Balance Dynamic Standing - Balance Support: During functional activity;Bilateral upper extremity supported Dynamic Standing - Level of Assistance: 4: Min assist;3: Mod assist Dynamic Standing - Balance Activities: Reaching for objects Dynamic Standing - Comments: Can flucuate depending on arousal  Exercises:   Other Treatments:     See Function Navigator for Current Functional Status.   Therapy/Group: Individual Therapy  Sharran Caratachea 12/25/2017, 12:51 PM

## 2017-12-25 NOTE — Progress Notes (Signed)
Occupational Therapy Session Note  Patient Details  Name: Dakota [name] MRN: [id number] Date of Birth: 01-21-1929  Today's Date: 12/25/2017 OT Individual Time: 1300-1326 OT Individual Time Calculation (min): 26 min    Short Term Goals: Week 3:  OT Short Term Goal 1 (Week 3): Continue working on established LTGs set at min to [employer].   Skilled Therapeutic Interventions/Progress Updates:    Upon entering the room, pt seated in wheelchair with caregiver, his son, present in room for continued education. Caregiver reports, " I don't think I need to practice anything else." OT educated caregiver on [place] recommendation and expectations in order for him to be an advocate for his father regarding therapy goals. He verbalized understanding. OT also discussing fall risks within the home and caregiver actively involved in conversation based on home set up. Caregiver reporting no further questions at this time and is comfortable with discharge plans. Pt remained seated in recliner chair with caregiver providing supervision and chair alarm on.    Therapy Documentation Precautions:  Precautions Precautions: Fall Precaution Comments: right gaze, left hemiparesis, left lean /pushing Restrictions Weight Bearing Restrictions: No General:   Vital Signs: Therapy Vitals Temp: 97.6 F (36.4 C) Temp Source: Oral Pulse Rate: (!) 101 Resp: 17 BP: (!) 152/84 Patient Position (if appropriate): Sitting Oxygen Therapy SpO2: 99 % O2 Device: Room Air Pain:   ADL: ADL ADL Comments: See functional navigator Praxis Praxis: Impaired Praxis Impairment Details: Motor planning;Initiation;Ideation Praxis-Other Comments: heavy cues for planning, initiation and sequencing  See Function Navigator for Current Functional Status.   Therapy/Group: Individual Therapy  Gypsy Decant 12/25/2017, 3:26 PM

## 2017-12-26 MED ORDER — RESOURCE THICKENUP CLEAR PO POWD: 1.0000 g | ORAL | 1 refills | Status: DC | PRN

## 2017-12-26 MED ORDER — DARIFENACIN HYDROBROMIDE ER 7.5 MG PO TB24: 7.5000 mg | ORAL_TABLET | Freq: Every day | ORAL | 0 refills | Status: DC

## 2017-12-26 MED ORDER — CHLORHEXIDINE GLUCONATE 0.12 % MT SOLN: 15.0000 mL | Freq: Two times a day (BID) | OROMUCOSAL | 0 refills | Status: DC

## 2017-12-26 MED ORDER — MIRTAZAPINE 15 MG PO TBDP: 15.0000 mg | ORAL_TABLET | Freq: Every day | ORAL | 0 refills | Status: DC

## 2017-12-26 MED ORDER — NICOTINE 7 MG/24HR TD PT24: 7.0000 mg | MEDICATED_PATCH | Freq: Every day | TRANSDERMAL | 0 refills | Status: DC

## 2017-12-26 MED ORDER — SENNOSIDES-DOCUSATE SODIUM 8.6-50 MG PO TABS: 2.0000 | ORAL_TABLET | Freq: Two times a day (BID) | ORAL | 0 refills | Status: DC

## 2017-12-26 MED ORDER — MELATONIN 3 MG PO TABS: 3.0000 mg | ORAL_TABLET | Freq: Every day | ORAL | 0 refills | Status: DC

## 2017-12-26 MED ORDER — FAMOTIDINE 20 MG PO TABS: 20.0000 mg | ORAL_TABLET | Freq: Two times a day (BID) | ORAL | 0 refills | Status: DC

## 2017-12-26 MED ORDER — WHITE PETROLATUM EX OINT: 1.0000 "application " | TOPICAL_OINTMENT | CUTANEOUS | 0 refills | Status: DC | PRN

## 2017-12-26 NOTE — Progress Notes (Signed)
Physical Therapy Discharge Summary  Patient Details  Name: Dakota [name] MRN: [id number] Date of Birth: Sep 02, 1928  Today's Date: 12/26/2017        Patient has met 8 of 8 long term goals due to improved activity tolerance, improved balance, improved postural control, increased strength, improved attention, improved awareness and improved coordination.  Patient to discharge at an ambulatory level [place].   Patient's care partner is independent to provide the necessary physical assistance at discharge.  Reasons goals not met:  ALL PT goals met  Recommendation:  Patient will benefit from ongoing skilled PT services in home health setting to continue to advance safe functional mobility, address ongoing impairments in balance, transfers, safety, attention, gait, awareness, strength, enfurance, and minimize fall risk.  Equipment: WC and RW   Reasons for discharge: treatment goals met  Patient/family agrees with progress made and goals achieved: Yes  PT Discharge Pain Pain Assessment Pain Score: 0-No pain Cognition Overall Cognitive Status: Impaired/Different from baseline Arousal/Alertness: Awake/alert Orientation Level: Oriented to person;Disoriented to time;Disoriented to situation Attention: Sustained Sustained Attention: Impaired Memory: Impaired Awareness: Impaired Problem Solving: Impaired Sensation Sensation Light Touch: Impaired by gross assessment Proprioception: Impaired by gross assessment Stereognosis: Impaired by gross assessment Coordination Gross Motor Movements are Fluid and Coordinated: No Fine Motor Movements are Fluid and Coordinated: No Motor  Motor Motor: Hemiplegia;Abnormal postural alignment and control Motor - Skilled Clinical Observations: reduced proprioception and sequencing L LE   Mobility Bed Mobility Bed Mobility: Supine to Sit;Sit to Supine Rolling Right: Minimal Assistance - Patient > 75% Rolling Left: Moderate Assistance - Patient  50-74% Supine to Sit: Minimal Assistance - Patient > 75% Sit to Supine: Moderate Assistance - Patient 50-74% Transfers Transfers: Sit to Stand;Stand to Sit Sit to Stand: Minimal Assistance - Patient > 75% Stand to Sit: Contact Guard/Touching assist Stand Pivot Transfers: Contact Guard/Touching assist Locomotion  Gait Ambulation: Yes Gait Assistance: Minimal Assistance - Patient > 75% Gait Distance (Feet): 120 Feet Assistive device: Rolling walker Gait Gait: Yes Gait Pattern: Impaired Gait Pattern: Decreased step length - left;Decreased stride length;Narrow base of support;Poor foot clearance - left;Poor foot clearance - right  Trunk/Postural Assessment  Cervical Assessment Cervical Assessment: Exceptions to WFL(forward head) Thoracic Assessment Thoracic Assessment: Exceptions to WFL(rounded shoulders) Lumbar Assessment Lumbar Assessment: Exceptions to WFL(posterior pelvic tilt) Postural Control Postural Control: Deficits on evaluation  Balance Balance Balance Assessed: Yes Static Sitting Balance Static Sitting - Balance Support: Feet supported;No upper extremity supported Dynamic Sitting Balance Dynamic Sitting - Balance Support: Feet supported Dynamic Sitting - Level of Assistance: 4: Min assist Dynamic Sitting - Balance Activities: Ball toss;Reaching for objects Static Standing Balance Static Standing - Balance Support: Bilateral upper extremity supported;During functional activity Static Standing - Level of Assistance: 4: Min assist Extremity Assessment      RLE Assessment RLE Assessment: Within Functional Limits LLE Assessment LLE Assessment: Exceptions to [facility] General Strength Comments: grossly 4-/5 proximal to distal   See Function Navigator for Current Functional Status.  Rosita DeChalus 12/26/2017, 9:13 AM

## 2017-12-26 NOTE — Progress Notes (Signed)
Social Work  Discharge Note  The overall goal for the admission was met for:   Discharge location: Yes - pt returning home with family to provide 24/7 assistance  Length of Stay: Yes - 22 days  Discharge activity level: Yes -minimal assistance overall  Home/community participation: Yes  Services provided included: MD, RD, PT, OT, SLP, RN, TR, Pharmacy and SW  Financial Services: Medicare and Private Insurance: Generic insurance  Follow-up services arranged: Home Health: PT, OT, ST via [facility], DME: 20x18 lightweight w/c, cushion, rolling walker, home suction machine - [facility] and Patient/Family has no preference for HH/DME agencies  Comments (or additional information):  Patient/Family verbalized understanding of follow-up arrangements: Yes  Individual responsible for coordination of the follow-up plan: son  Confirmed correct DME delivered: Lennart Pall 12/26/2017    Dua Mehler

## 2017-12-26 NOTE — Discharge Instructions (Signed)
Inpatient Rehab Discharge Instructions  [facility] Discharge date and time:  12/26/17  Activities/Precautions/ Functional Status: Activity: no lifting, driving, or strenuous exercise  till cleared by MD Diet: Foods need to be pureed. Liquids have to be nectar thick.  Wound Care:  Not needed.    Functional status:  ___ No restrictions     ___ Walk up steps independently _X__ 24/7 supervision/assistanc e   ___ Walk up steps with assistance ___ Intermittent supervision/assistance  ___ Bathe/dress independently ___ Walk with walker     _X__ Bathe/dress with assistance ___ Walk Independently    ___ Shower independently ___ Walk with assistance    ___ Shower with assistance _X__ No alcohol     ___ Return to work/school ________    COMMUNITY REFERRALS UPON DISCHARGE:    Home Health:   PT    OT     ST                      Agency:  [facility] Phone: [phone]   Medical Equipment/Items Ordered:  Wheelchair, cushion, walker, suction machine                                                      Agency/Supplier:  [facility] @ [phone]    GENERAL COMMUNITY RESOURCES FOR PATIENT/FAMILY:  Support Groups:  Stroke Group (handout)    Special Instructions: 1. Family need to provide hands on assist with all activity. Do not allow patient to get up by himself. 2. Needs full supervision with  Meals. Do not feed patient if not fully awake. Foods have be be purred and liquids have to be thickened as instructed.  Follow water protocol after oral care between meals.  3. Set a daily routine to help with sleep cycle.    STROKE/TIA DISCHARGE INSTRUCTIONS SMOKING Cigarette smoking nearly doubles your risk of having a stroke & is the single most alterable risk factor  If you smoke or have smoked in the last 12 months, you are advised to quit smoking for your health.  Most of the excess cardiovascular risk related to smoking disappears within a year of stopping.  Ask you doctor  about anti-smoking medications   Quit Line: 1-800-QUIT NOW  Free Smoking Cessation Classes [phone]  CHOLESTEROL Know your levels; limit fat & cholesterol in your diet  Lipid Panel     Component Value Date/Time   CHOL 151 11/17/2017 0500   TRIG 61 11/17/2017 0500   HDL 37 (L) 11/17/2017 0500   CHOLHDL 4.1 11/17/2017 0500   VLDL 12 11/17/2017 0500   LDLCALC 102 (H) 11/17/2017 0500      Many patients benefit from treatment even if their cholesterol is at goal.  Goal: Total Cholesterol (CHOL) less than 160  Goal:  Triglycerides (TRIG) less than 150  Goal:  HDL greater than 40  Goal:  LDL (LDLCALC) less than 100   BLOOD PRESSURE American Stroke Association blood pressure target is less that 120/80 mm/Hg  Your discharge blood pressure is:  BP: 138/81  Monitor your blood pressure  Limit your salt and alcohol intake  Many individuals will require more than one medication for high blood pressure  DIABETES (A1c is a blood sugar average for last 3 months) Goal HGBA1c is under 7% (HBGA1c is blood  sugar average for last 3 months)  Diabetes: Pre-diabetes    Lab Results  Component Value Date   HGBA1C 6.1 (H) 11/17/2017     Your HGBA1c can be lowered with medications, healthy diet, and exercise.  Check your blood sugar as directed by your physician  Call your physician if you experience unexplained or low blood sugars.  PHYSICAL ACTIVITY/REHABILITATION Goal is 30 minutes at least 4 days per week  Activity: No driving, Therapies: see above Return to work: N/A  Activity decreases your risk of heart attack and stroke and makes your heart stronger.  It helps control your weight and blood pressure; helps you relax and can improve your mood.  Participate in a regular exercise program.  Talk with your doctor about the best form of exercise for you (dancing, walking, swimming, cycling).  DIET/WEIGHT Goal is to maintain a healthy weight  Your discharge diet is:  Diet Order             DIET - DYS 1 Room service appropriate? Yes; Fluid consistency: Nectar Thick  Diet effective now             liquids Your height is:  Height: 5\' 7"  (170.2 cm) Your current weight is: Weight: 74.3 kg Your Body Mass Index (BMI) is:  BMI (Calculated): 25.65  Following the type of diet specifically designed for you will help prevent another stroke.  You are at goal weight   Your goal Body Mass Index (BMI) is 19-24.  Healthy food habits can help reduce 3 risk factors for stroke:  High cholesterol, hypertension, and excess weight.  RESOURCES Stroke/Support Group:  Call [phone]   STROKE EDUCATION PROVIDED/REVIEWED AND GIVEN TO PATIENT Stroke warning signs and symptoms How to activate emergency medical system (call 911). Medications prescribed at discharge. Need for follow-up after discharge. Personal risk factors for stroke. Pneumonia vaccine given:  Flu vaccine given:  My questions have been answered, the writing is legible, and I understand these instructions.  I will adhere to these goals & educational materials that have been provided to me after my discharge from the hospital.     My questions have been answered and I understand these instructions. I will adhere to these goals and the provided educational materials after my discharge from the hospital.  Patient/Caregiver Signature _______________________________ Date __________  Clinician Signature _______________________________________ Date __________  Please bring this form and your medication list with you to all your follow-up doctor's appointments.

## 2017-12-27 ENCOUNTER — Encounter: Payer: Self-pay | Admitting: Physical Medicine & Rehabilitation

## 2017-12-27 ENCOUNTER — Telehealth: Payer: Self-pay | Admitting: *Deleted

## 2017-12-27 ENCOUNTER — Telehealth: Payer: Self-pay

## 2017-12-27 ENCOUNTER — Telehealth: Payer: Self-pay | Admitting: Family Medicine

## 2017-12-27 DIAGNOSIS — I69398 Other sequelae of cerebral infarction: Secondary | ICD-10-CM | POA: Diagnosis not present

## 2017-12-27 DIAGNOSIS — Z87891 Personal history of nicotine dependence: Secondary | ICD-10-CM | POA: Diagnosis not present

## 2017-12-27 DIAGNOSIS — I10 Essential (primary) hypertension: Secondary | ICD-10-CM | POA: Diagnosis not present

## 2017-12-27 DIAGNOSIS — I442 Atrioventricular block, complete: Secondary | ICD-10-CM | POA: Diagnosis not present

## 2017-12-27 DIAGNOSIS — I69318 Other symptoms and signs involving cognitive functions following cerebral infarction: Secondary | ICD-10-CM | POA: Diagnosis not present

## 2017-12-27 DIAGNOSIS — R1310 Dysphagia, unspecified: Secondary | ICD-10-CM | POA: Diagnosis not present

## 2017-12-27 DIAGNOSIS — G8929 Other chronic pain: Secondary | ICD-10-CM | POA: Diagnosis not present

## 2017-12-27 DIAGNOSIS — I482 Chronic atrial fibrillation: Secondary | ICD-10-CM | POA: Diagnosis not present

## 2017-12-27 DIAGNOSIS — I69354 Hemiplegia and hemiparesis following cerebral infarction affecting left non-dominant side: Secondary | ICD-10-CM | POA: Diagnosis not present

## 2017-12-27 DIAGNOSIS — I69391 Dysphagia following cerebral infarction: Secondary | ICD-10-CM | POA: Diagnosis not present

## 2017-12-27 DIAGNOSIS — Z950 Presence of cardiac pacemaker: Secondary | ICD-10-CM | POA: Diagnosis not present

## 2017-12-27 DIAGNOSIS — K219 Gastro-esophageal reflux disease without esophagitis: Secondary | ICD-10-CM | POA: Diagnosis not present

## 2017-12-27 DIAGNOSIS — K260 Acute duodenal ulcer with hemorrhage: Secondary | ICD-10-CM | POA: Diagnosis not present

## 2017-12-27 DIAGNOSIS — M549 Dorsalgia, unspecified: Secondary | ICD-10-CM | POA: Diagnosis not present

## 2017-12-27 DIAGNOSIS — R2689 Other abnormalities of gait and mobility: Secondary | ICD-10-CM | POA: Diagnosis not present

## 2017-12-27 DIAGNOSIS — Z86718 Personal history of other venous thrombosis and embolism: Secondary | ICD-10-CM | POA: Diagnosis not present

## 2017-12-27 DIAGNOSIS — Z79899 Other long term (current) drug therapy: Secondary | ICD-10-CM | POA: Diagnosis not present

## 2017-12-27 NOTE — Telephone Encounter (Signed)
Copied from [place] [phone]. Topic: Quick Communication - See Telephone Encounter >> Dec 27, 2017 12:29 PM Burchel, Abbi R wrote: CRM for notification. See Telephone encounter for: 12/27/17.  Erline Levine ([place]) requesting v/o for PT 2*4wk   [phone] ok to leave VM

## 2017-12-27 NOTE — Telephone Encounter (Signed)
Lm with pts son (not on DPR) and requested a call back to complete TCM

## 2017-12-27 NOTE — Telephone Encounter (Signed)
Transitional Care call-daughter- [name]    1. Are you/is patient experiencing any problems since coming home? No Are there any questions regarding any aspect of care? No 2. Are there any questions regarding medications administration/dosing? BP will ask Dr. Terese Door  Are meds being taken as prescribed?Yes Patient should review meds with caller to confirm 3. Have there been any falls? No 4. Has Home Health been to the house and/or have they contacted you? Yes If not, have you tried to contact them? Can we help you contact them? 5. Are bowels and bladder emptying properly? Not sure Are there any unexpected incontinence issues? Maybe, will discuss at f/u If applicable, is patient following bowel/bladder programs? 6. Any fevers, problems with breathing, unexpected pain? No 7. Are there any skin problems or new areas of breakdown? No 8. Has the patient/family member arranged specialty MD follow up (ie cardiology/neurology/renal/surgical/etc)? Yes PCP will contact [facility] Can we help arrange? 9. Does the patient need any other services or support that we can help arrange? No 10. Are caregivers following through as expected in assisting the patient? Yes 11. Has the patient quit smoking, drinking alcohol, or using drugs as recommended? Yes  Appointment time, arrive time 10:20 for appt 10:40 on 01/11/18 with Dr. Posey Pronto [address] [phone]

## 2017-12-28 DIAGNOSIS — I69354 Hemiplegia and hemiparesis following cerebral infarction affecting left non-dominant side: Secondary | ICD-10-CM | POA: Diagnosis not present

## 2017-12-28 DIAGNOSIS — I69318 Other symptoms and signs involving cognitive functions following cerebral infarction: Secondary | ICD-10-CM | POA: Diagnosis not present

## 2017-12-28 DIAGNOSIS — I69398 Other sequelae of cerebral infarction: Secondary | ICD-10-CM | POA: Diagnosis not present

## 2017-12-28 DIAGNOSIS — R1310 Dysphagia, unspecified: Secondary | ICD-10-CM | POA: Diagnosis not present

## 2017-12-28 DIAGNOSIS — R2689 Other abnormalities of gait and mobility: Secondary | ICD-10-CM | POA: Diagnosis not present

## 2017-12-28 DIAGNOSIS — I69391 Dysphagia following cerebral infarction: Secondary | ICD-10-CM | POA: Diagnosis not present

## 2017-12-28 NOTE — Telephone Encounter (Signed)
Lm with pts son, requesting a call back. Son Mortimer Fries not listed on [place].

## 2017-12-28 NOTE — Telephone Encounter (Signed)
Dakota [name] (PT [facility]) advised.

## 2017-12-28 NOTE — Telephone Encounter (Signed)
Please give the order.  Thanks.   

## 2017-12-29 ENCOUNTER — Telehealth: Payer: Self-pay | Admitting: Family Medicine

## 2017-12-29 DIAGNOSIS — I69318 Other symptoms and signs involving cognitive functions following cerebral infarction: Secondary | ICD-10-CM | POA: Diagnosis not present

## 2017-12-29 DIAGNOSIS — R1310 Dysphagia, unspecified: Secondary | ICD-10-CM | POA: Diagnosis not present

## 2017-12-29 DIAGNOSIS — I69354 Hemiplegia and hemiparesis following cerebral infarction affecting left non-dominant side: Secondary | ICD-10-CM | POA: Diagnosis not present

## 2017-12-29 DIAGNOSIS — I69398 Other sequelae of cerebral infarction: Secondary | ICD-10-CM | POA: Diagnosis not present

## 2017-12-29 DIAGNOSIS — R2689 Other abnormalities of gait and mobility: Secondary | ICD-10-CM | POA: Diagnosis not present

## 2017-12-29 DIAGNOSIS — I69391 Dysphagia following cerebral infarction: Secondary | ICD-10-CM | POA: Diagnosis not present

## 2017-12-29 NOTE — Telephone Encounter (Signed)
Copied from [place] [phone]. Topic: Quick Communication - See Telephone Encounter >> Dec 29, 2017  4:22 PM Bea Graff, NT wrote: CRM for notification. See Telephone encounter for: 12/29/17. Amy, nurse with [facility] calling to request verbal orders for speech therapy for 1 week 1 visit and then 6 visits in 1 month. She would like orders for the pt to have thin liquids with supervision. He will be coming home with nectar thickening. Also, she states he had two falls at home, one last night and once this morning with no injury. CB#: [phone].

## 2017-12-29 NOTE — Telephone Encounter (Signed)
Ester with [facility] advised.

## 2017-12-29 NOTE — Telephone Encounter (Signed)
Copied from [place] [id number]. Topic: General - Other >> Dec 29, 2017 12:21 PM Carolyn Stare wrote:  Sherlynn Stalls with [facility] call to req verbal 1 x 1  2 x 3 for OT   [phone]

## 2017-12-29 NOTE — Telephone Encounter (Signed)
Please give the order.  Thanks.   

## 2017-12-29 NOTE — Telephone Encounter (Signed)
Order given to Amy with [facility].  Amy states that patient does have PT.

## 2017-12-29 NOTE — Telephone Encounter (Signed)
Please give the order (thin liquids only with supervision).   Please make sure he has orders for PT given the fall hx.  I can give a verbal order if needed.   update me as needed.   Thanks.

## 2018-01-01 DIAGNOSIS — I69391 Dysphagia following cerebral infarction: Secondary | ICD-10-CM | POA: Diagnosis not present

## 2018-01-01 DIAGNOSIS — R2689 Other abnormalities of gait and mobility: Secondary | ICD-10-CM | POA: Diagnosis not present

## 2018-01-01 DIAGNOSIS — I69354 Hemiplegia and hemiparesis following cerebral infarction affecting left non-dominant side: Secondary | ICD-10-CM | POA: Diagnosis not present

## 2018-01-01 DIAGNOSIS — R1310 Dysphagia, unspecified: Secondary | ICD-10-CM | POA: Diagnosis not present

## 2018-01-01 DIAGNOSIS — I69398 Other sequelae of cerebral infarction: Secondary | ICD-10-CM | POA: Diagnosis not present

## 2018-01-01 DIAGNOSIS — I69318 Other symptoms and signs involving cognitive functions following cerebral infarction: Secondary | ICD-10-CM | POA: Diagnosis not present

## 2018-01-02 DIAGNOSIS — R1310 Dysphagia, unspecified: Secondary | ICD-10-CM | POA: Diagnosis not present

## 2018-01-02 DIAGNOSIS — R2689 Other abnormalities of gait and mobility: Secondary | ICD-10-CM | POA: Diagnosis not present

## 2018-01-02 DIAGNOSIS — I69391 Dysphagia following cerebral infarction: Secondary | ICD-10-CM | POA: Diagnosis not present

## 2018-01-02 DIAGNOSIS — I69398 Other sequelae of cerebral infarction: Secondary | ICD-10-CM | POA: Diagnosis not present

## 2018-01-02 DIAGNOSIS — I69318 Other symptoms and signs involving cognitive functions following cerebral infarction: Secondary | ICD-10-CM | POA: Diagnosis not present

## 2018-01-02 DIAGNOSIS — I69354 Hemiplegia and hemiparesis following cerebral infarction affecting left non-dominant side: Secondary | ICD-10-CM | POA: Diagnosis not present

## 2018-01-04 ENCOUNTER — Telehealth: Payer: Self-pay

## 2018-01-04 DIAGNOSIS — I69318 Other symptoms and signs involving cognitive functions following cerebral infarction: Secondary | ICD-10-CM | POA: Diagnosis not present

## 2018-01-04 DIAGNOSIS — I69398 Other sequelae of cerebral infarction: Secondary | ICD-10-CM | POA: Diagnosis not present

## 2018-01-04 DIAGNOSIS — R2689 Other abnormalities of gait and mobility: Secondary | ICD-10-CM | POA: Diagnosis not present

## 2018-01-04 DIAGNOSIS — R1310 Dysphagia, unspecified: Secondary | ICD-10-CM | POA: Diagnosis not present

## 2018-01-04 DIAGNOSIS — I69354 Hemiplegia and hemiparesis following cerebral infarction affecting left non-dominant side: Secondary | ICD-10-CM | POA: Diagnosis not present

## 2018-01-04 DIAGNOSIS — I69391 Dysphagia following cerebral infarction: Secondary | ICD-10-CM | POA: Diagnosis not present

## 2018-01-04 NOTE — Telephone Encounter (Signed)
Copied from [place] [phone]. Topic: General - Other >> Jan 04, 2018  3:12 PM [place], Valda Favia wrote: Jonna Coup from [facility] is calling in to request verbal orders to upgrade diet from puree to regular texture   Cb# [phone]

## 2018-01-05 ENCOUNTER — Telehealth: Payer: Self-pay | Admitting: Family Medicine

## 2018-01-05 DIAGNOSIS — I69354 Hemiplegia and hemiparesis following cerebral infarction affecting left non-dominant side: Secondary | ICD-10-CM | POA: Diagnosis not present

## 2018-01-05 DIAGNOSIS — R2689 Other abnormalities of gait and mobility: Secondary | ICD-10-CM | POA: Diagnosis not present

## 2018-01-05 DIAGNOSIS — I69318 Other symptoms and signs involving cognitive functions following cerebral infarction: Secondary | ICD-10-CM | POA: Diagnosis not present

## 2018-01-05 DIAGNOSIS — R1310 Dysphagia, unspecified: Secondary | ICD-10-CM | POA: Diagnosis not present

## 2018-01-05 DIAGNOSIS — I69391 Dysphagia following cerebral infarction: Secondary | ICD-10-CM | POA: Diagnosis not present

## 2018-01-05 DIAGNOSIS — I69398 Other sequelae of cerebral infarction: Secondary | ICD-10-CM | POA: Diagnosis not present

## 2018-01-05 NOTE — Telephone Encounter (Signed)
Agree with this. Thanks.  

## 2018-01-05 NOTE — Telephone Encounter (Signed)
Copied from [place] [id number]. Topic: Quick Communication - See Telephone Encounter >> Jan 05, 2018 10:24 AM Ahmed Prima L wrote: CRM for notification. See Telephone encounter for: 01/05/18.  Esther, occupational therapist with [facility] called and said his vital's are within normal limits but is confused and has Altered mental status. He has done this for three weeks on and off and as well at rehab. One day he is fine the next day he is not. She would like to know what does Dr Silvio Pate suggest? He did not sleep last night and is very tired today. Call back @ [phone]

## 2018-01-05 NOTE — Telephone Encounter (Signed)
Amy McKee with [facility] advised.

## 2018-01-05 NOTE — Telephone Encounter (Signed)
Left detailed message on voicemail of Sherlynn Stalls. OT with [facility].

## 2018-01-05 NOTE — Telephone Encounter (Signed)
This could be due to mult causes ranging from fatigue (didn't sleep well last night) to ongoing medical issue.   I would suggest OV in the near future.  If he has persistent or emergent sx, then needs eval ASAP.  Please try to get more details/triage in the meantime (ie dysuria, blood in stool, cough, etc).  Thanks.

## 2018-01-08 ENCOUNTER — Encounter: Payer: Self-pay | Admitting: Family Medicine

## 2018-01-08 ENCOUNTER — Ambulatory Visit (INDEPENDENT_AMBULATORY_CARE_PROVIDER_SITE_OTHER): Payer: Medicare Other | Admitting: Family Medicine

## 2018-01-08 VITALS — BP 104/60 | HR 87 | Temp 97.5°F | Ht 67.0 in | Wt 164.5 lb

## 2018-01-08 DIAGNOSIS — I63511 Cerebral infarction due to unspecified occlusion or stenosis of right middle cerebral artery: Secondary | ICD-10-CM | POA: Diagnosis not present

## 2018-01-08 DIAGNOSIS — G4700 Insomnia, unspecified: Secondary | ICD-10-CM | POA: Diagnosis not present

## 2018-01-08 DIAGNOSIS — F17229 Nicotine dependence, chewing tobacco, with unspecified nicotine-induced disorders: Secondary | ICD-10-CM

## 2018-01-08 DIAGNOSIS — I69391 Dysphagia following cerebral infarction: Secondary | ICD-10-CM | POA: Diagnosis not present

## 2018-01-08 DIAGNOSIS — K5900 Constipation, unspecified: Secondary | ICD-10-CM

## 2018-01-08 DIAGNOSIS — D62 Acute posthemorrhagic anemia: Secondary | ICD-10-CM

## 2018-01-08 DIAGNOSIS — I639 Cerebral infarction, unspecified: Secondary | ICD-10-CM

## 2018-01-08 LAB — BASIC METABOLIC PANEL
BUN: 16 mg/dL (ref 6–23)
CALCIUM: 9.7 mg/dL (ref 8.4–10.5)
CO2: 29 meq/L (ref 19–32)
Chloride: 104 mEq/L (ref 96–112)
Creatinine, Ser: 1.16 mg/dL (ref 0.40–1.50)
GFR: 63.3 mL/min (ref >60.00)
Glucose, Bld: 99 mg/dL (ref 70–99)
POTASSIUM: 4.4 meq/L (ref 3.5–5.1)
SODIUM: 137 meq/L (ref 135–145)

## 2018-01-08 LAB — CBC WITH DIFFERENTIAL/PLATELET
BASOS ABS: 0.1 10*3/uL (ref 0.0–0.1)
Basophils Relative: 1 % (ref 0.0–3.0)
Eosinophils Absolute: 0.4 10*3/uL (ref 0.0–0.7)
Eosinophils Relative: 6 % — ABNORMAL HIGH (ref 0.0–5.0)
HEMATOCRIT: 40.5 % (ref 39.0–52.0)
Hemoglobin: 13.6 g/dL (ref 13.0–17.0)
Lymphocytes Relative: 31.5 % (ref 12.0–46.0)
Lymphs Abs: 2.4 10*3/uL (ref 0.7–4.0)
MCHC: 33.6 g/dL (ref 30.0–36.0)
MCV: 88.2 fl (ref 78.0–100.0)
MONOS PCT: 11.2 % (ref 3.0–12.0)
Monocytes Absolute: 0.8 10*3/uL (ref 0.1–1.0)
NEUTROS ABS: 3.8 10*3/uL (ref 1.4–7.7)
Neutrophils Relative %: 50.3 % (ref 43.0–77.0)
PLATELETS: 262 10*3/uL (ref 150.0–400.0)
RBC: 4.59 Mil/uL (ref 4.22–5.81)
RDW: 14.5 % (ref 11.5–15.5)
WBC: 7.5 10*3/uL (ref 4.0–10.5)

## 2018-01-08 MED ORDER — POLYETHYLENE GLYCOL 3350 17 GM/SCOOP PO POWD: ORAL | 1 refills | Status: DC

## 2018-01-08 MED ORDER — MIRTAZAPINE 15 MG PO TBDP: 7.5000 mg | ORAL_TABLET | Freq: Every day | ORAL | 1 refills | Status: DC

## 2018-01-08 NOTE — Progress Notes (Signed)
Here today with son. Back at home and using walker now.  No falls.   Constipation noted.  No relief with qd miralax. D/w pt about options, specifically to increase MiraLAX to 2 doses per day.  Insomnia.  Previously treated with 15 mg of mirtazapine.  Too sleepy with 15mg  mirtazapine, better on lower dose (7.5mg ).  Unclear how much nicotine withdrawal contributed to sleep changes.  Per son, "when he sleeps good, he does a whole lot better."  He was more anxious off nicotine, he is back to chewing tobacco.  D/w pt about options.  Prev used patch.    H/o CVA and bleeding noted when anticoagulated.  No blood thinners currently.  No bleeding now.   Has OT and PT and ST at the house.  His swallowing is clearly better than prev, per patient report.  Cautions d/w pt.   He still has left-sided weakness and he is trying to compensate for that.  Family is helping out.  PMH and SH reviewed  ROS: Per HPI unless specifically indicated in ROS section   Meds, vitals, and allergies reviewed.   nad ncat Mmm Neck supple, no LA IRR, not tachy.   ctab L arm and leg weakness is at his recent baseline. Oriented to self, but not month.

## 2018-01-08 NOTE — Patient Instructions (Addendum)
Keep working with PT/OT/ST (therapy).   Go to the lab on the way out.  We'll contact you with your lab report. Try taking miralax twice a day.  Update me as needed.  Use 7.5mg  mirtazapine at night.  Can use 15mg  if needed.  Take care.  Glad to see you.

## 2018-01-09 ENCOUNTER — Ambulatory Visit: Payer: Medicare Other

## 2018-01-09 DIAGNOSIS — I69354 Hemiplegia and hemiparesis following cerebral infarction affecting left non-dominant side: Secondary | ICD-10-CM | POA: Diagnosis not present

## 2018-01-09 DIAGNOSIS — I69391 Dysphagia following cerebral infarction: Secondary | ICD-10-CM | POA: Diagnosis not present

## 2018-01-09 DIAGNOSIS — R1310 Dysphagia, unspecified: Secondary | ICD-10-CM | POA: Diagnosis not present

## 2018-01-09 DIAGNOSIS — I69398 Other sequelae of cerebral infarction: Secondary | ICD-10-CM | POA: Diagnosis not present

## 2018-01-09 DIAGNOSIS — I69318 Other symptoms and signs involving cognitive functions following cerebral infarction: Secondary | ICD-10-CM | POA: Diagnosis not present

## 2018-01-09 DIAGNOSIS — R2689 Other abnormalities of gait and mobility: Secondary | ICD-10-CM | POA: Diagnosis not present

## 2018-01-11 ENCOUNTER — Encounter: Payer: Medicare Other | Admitting: Physical Medicine & Rehabilitation

## 2018-01-11 DIAGNOSIS — G4700 Insomnia, unspecified: Secondary | ICD-10-CM | POA: Insufficient documentation

## 2018-01-11 NOTE — Assessment & Plan Note (Signed)
Unclear how much nicotine withdrawal was affecting his insomnia.  We talked about nicotine replacement options.  I will defer to patient and family.

## 2018-01-11 NOTE — Assessment & Plan Note (Signed)
He has persistent left-sided weakness.  He is trying to compensate.  He has OT, PT, ST at the house.  Continue with current medications and therapy.  He agrees.  Family is helping him at home.  Given his history of bleeding it makes sense to avoid anticoagulation for now.

## 2018-01-11 NOTE — Assessment & Plan Note (Addendum)
Recheck labs today.  Given history of bleeding it makes sense to avoid anticoagulation for now.  Discussed. >25 minutes spent in face to face time with patient, >50% spent in counselling or coordination of care.

## 2018-01-11 NOTE — Assessment & Plan Note (Signed)
Reasonable to use mirtazapine 7.5 mg at night.  If he is having significant difficulty sleeping he can transiently increase to 15 mg at night.  It may be that he does better on 7.5 mg chronically.  Update me as needed.  Patient family agree.

## 2018-01-11 NOTE — Assessment & Plan Note (Signed)
Continue speech therapy.  Routine cautions given.

## 2018-01-11 NOTE — Assessment & Plan Note (Signed)
Can try twice daily MiraLAX in the meantime and update me as needed.  Patient and son agree.

## 2018-01-12 ENCOUNTER — Telehealth: Payer: Self-pay | Admitting: Family Medicine

## 2018-01-12 DIAGNOSIS — R1310 Dysphagia, unspecified: Secondary | ICD-10-CM | POA: Diagnosis not present

## 2018-01-12 DIAGNOSIS — I69398 Other sequelae of cerebral infarction: Secondary | ICD-10-CM | POA: Diagnosis not present

## 2018-01-12 DIAGNOSIS — I69318 Other symptoms and signs involving cognitive functions following cerebral infarction: Secondary | ICD-10-CM | POA: Diagnosis not present

## 2018-01-12 DIAGNOSIS — R2689 Other abnormalities of gait and mobility: Secondary | ICD-10-CM | POA: Diagnosis not present

## 2018-01-12 DIAGNOSIS — I69354 Hemiplegia and hemiparesis following cerebral infarction affecting left non-dominant side: Secondary | ICD-10-CM | POA: Diagnosis not present

## 2018-01-12 DIAGNOSIS — I69391 Dysphagia following cerebral infarction: Secondary | ICD-10-CM | POA: Diagnosis not present

## 2018-01-12 NOTE — Telephone Encounter (Signed)
Copied from [place] [phone]. Topic: Quick Communication - See Telephone Encounter >> Jan 12, 2018  1:58 PM Berneta Levins wrote: CRM for notification. See Telephone encounter for: 01/12/18.  Esther from [facility] calling to let the doctor know that pt missed a visit yesterday because he was too sleepy. Sherlynn Stalls is going on vacation next week and would like to move the 2 visits next week to the following week. Sherlynn Stalls can be reached at [phone]

## 2018-01-14 NOTE — Telephone Encounter (Signed)
Please give the order.  Thanks.   

## 2018-01-15 ENCOUNTER — Encounter: Payer: Self-pay | Admitting: Family Medicine

## 2018-01-15 DIAGNOSIS — R1310 Dysphagia, unspecified: Secondary | ICD-10-CM | POA: Diagnosis not present

## 2018-01-15 DIAGNOSIS — I69354 Hemiplegia and hemiparesis following cerebral infarction affecting left non-dominant side: Secondary | ICD-10-CM | POA: Diagnosis not present

## 2018-01-15 DIAGNOSIS — I69391 Dysphagia following cerebral infarction: Secondary | ICD-10-CM | POA: Diagnosis not present

## 2018-01-15 DIAGNOSIS — I69398 Other sequelae of cerebral infarction: Secondary | ICD-10-CM | POA: Diagnosis not present

## 2018-01-15 DIAGNOSIS — I69318 Other symptoms and signs involving cognitive functions following cerebral infarction: Secondary | ICD-10-CM | POA: Diagnosis not present

## 2018-01-15 DIAGNOSIS — R2689 Other abnormalities of gait and mobility: Secondary | ICD-10-CM | POA: Diagnosis not present

## 2018-01-15 NOTE — Telephone Encounter (Signed)
Esther advised. 

## 2018-01-16 ENCOUNTER — Encounter: Payer: Medicare Other | Admitting: Family Medicine

## 2018-01-17 ENCOUNTER — Telehealth: Payer: Self-pay | Admitting: Family Medicine

## 2018-01-17 DIAGNOSIS — K264 Chronic or unspecified duodenal ulcer with hemorrhage: Secondary | ICD-10-CM | POA: Diagnosis not present

## 2018-01-17 DIAGNOSIS — I63511 Cerebral infarction due to unspecified occlusion or stenosis of right middle cerebral artery: Secondary | ICD-10-CM | POA: Diagnosis not present

## 2018-01-17 DIAGNOSIS — K269 Duodenal ulcer, unspecified as acute or chronic, without hemorrhage or perforation: Secondary | ICD-10-CM | POA: Diagnosis not present

## 2018-01-17 MED ORDER — TRAZODONE HCL 50 MG PO TABS: 25.0000 mg | ORAL_TABLET | Freq: Every evening | ORAL | 3 refills | Status: DC | PRN

## 2018-01-17 NOTE — Telephone Encounter (Signed)
Phone call with daughter.  Patient is not sleeping well and then he gets more irritable the next day.  Mirtazapine seems not to be helping with sleep.  He does have constipation on mirtazapine.  Discussed options.  Taking a half dose of melatonin was not helping enough with sleep.  Taking a whole dose right before bed made him a little groggy the next day.  It may be reasonable to try taking a whole dose of melatonin earlier before bed to see if it is still effective and if the effects wear off early enough the next day for him to have a better morning.  If that does not work it may be reasonable to try trazodone at night.  This is with the assumption that he does not have a new illness.  He does not complain of dysuria and does not have a fever or cough.  He has no obvious source for another acute illness.  Discussed.  Daughter agreed.  She will update me if not better.  I thank all involved.

## 2018-01-18 DIAGNOSIS — I69398 Other sequelae of cerebral infarction: Secondary | ICD-10-CM | POA: Diagnosis not present

## 2018-01-18 DIAGNOSIS — I69391 Dysphagia following cerebral infarction: Secondary | ICD-10-CM | POA: Diagnosis not present

## 2018-01-18 DIAGNOSIS — R1310 Dysphagia, unspecified: Secondary | ICD-10-CM | POA: Diagnosis not present

## 2018-01-18 DIAGNOSIS — R2689 Other abnormalities of gait and mobility: Secondary | ICD-10-CM | POA: Diagnosis not present

## 2018-01-18 DIAGNOSIS — I69318 Other symptoms and signs involving cognitive functions following cerebral infarction: Secondary | ICD-10-CM | POA: Diagnosis not present

## 2018-01-18 DIAGNOSIS — I69354 Hemiplegia and hemiparesis following cerebral infarction affecting left non-dominant side: Secondary | ICD-10-CM | POA: Diagnosis not present

## 2018-01-29 ENCOUNTER — Ambulatory Visit (HOSPITAL_COMMUNITY)
Admission: EM | Admit: 2018-01-29 | Discharge: 2018-01-29 | Disposition: A | Discharge status: Home / Self Care | Payer: Medicare Other | Attending: Emergency Medicine | Admitting: Emergency Medicine

## 2018-01-29 ENCOUNTER — Other Ambulatory Visit: Payer: Self-pay

## 2018-01-29 ENCOUNTER — Emergency Department (HOSPITAL_COMMUNITY): Payer: Medicare Other

## 2018-01-29 ENCOUNTER — Encounter (HOSPITAL_COMMUNITY)
Admission: EM | Disposition: A | Discharge status: Home / Self Care | Payer: Self-pay | Source: Home / Self Care | Attending: Emergency Medicine

## 2018-01-29 ENCOUNTER — Emergency Department (HOSPITAL_COMMUNITY): Payer: Medicare Other | Admitting: Certified Registered Nurse Anesthetist

## 2018-01-29 ENCOUNTER — Encounter (HOSPITAL_COMMUNITY): Payer: Self-pay | Admitting: Emergency Medicine

## 2018-01-29 DIAGNOSIS — E785 Hyperlipidemia, unspecified: Secondary | ICD-10-CM | POA: Insufficient documentation

## 2018-01-29 DIAGNOSIS — K222 Esophageal obstruction: Secondary | ICD-10-CM

## 2018-01-29 DIAGNOSIS — I10 Essential (primary) hypertension: Secondary | ICD-10-CM | POA: Diagnosis not present

## 2018-01-29 DIAGNOSIS — R0902 Hypoxemia: Secondary | ICD-10-CM | POA: Diagnosis not present

## 2018-01-29 DIAGNOSIS — Z8249 Family history of ischemic heart disease and other diseases of the circulatory system: Secondary | ICD-10-CM | POA: Insufficient documentation

## 2018-01-29 DIAGNOSIS — I252 Old myocardial infarction: Secondary | ICD-10-CM | POA: Insufficient documentation

## 2018-01-29 DIAGNOSIS — R1310 Dysphagia, unspecified: Secondary | ICD-10-CM | POA: Diagnosis present

## 2018-01-29 DIAGNOSIS — R1314 Dysphagia, pharyngoesophageal phase: Secondary | ICD-10-CM | POA: Diagnosis not present

## 2018-01-29 DIAGNOSIS — F419 Anxiety disorder, unspecified: Secondary | ICD-10-CM | POA: Insufficient documentation

## 2018-01-29 DIAGNOSIS — Z87891 Personal history of nicotine dependence: Secondary | ICD-10-CM | POA: Diagnosis not present

## 2018-01-29 DIAGNOSIS — I2510 Atherosclerotic heart disease of native coronary artery without angina pectoris: Secondary | ICD-10-CM | POA: Diagnosis not present

## 2018-01-29 DIAGNOSIS — Z888 Allergy status to other drugs, medicaments and biological substances status: Secondary | ICD-10-CM | POA: Diagnosis not present

## 2018-01-29 DIAGNOSIS — Z950 Presence of cardiac pacemaker: Secondary | ICD-10-CM | POA: Diagnosis not present

## 2018-01-29 DIAGNOSIS — Z79899 Other long term (current) drug therapy: Secondary | ICD-10-CM | POA: Insufficient documentation

## 2018-01-29 DIAGNOSIS — T18128A Food in esophagus causing other injury, initial encounter: Secondary | ICD-10-CM | POA: Insufficient documentation

## 2018-01-29 DIAGNOSIS — Z8711 Personal history of peptic ulcer disease: Secondary | ICD-10-CM | POA: Diagnosis not present

## 2018-01-29 DIAGNOSIS — I739 Peripheral vascular disease, unspecified: Secondary | ICD-10-CM | POA: Insufficient documentation

## 2018-01-29 DIAGNOSIS — X58XXXA Exposure to other specified factors, initial encounter: Secondary | ICD-10-CM | POA: Insufficient documentation

## 2018-01-29 DIAGNOSIS — I482 Chronic atrial fibrillation: Secondary | ICD-10-CM | POA: Diagnosis not present

## 2018-01-29 DIAGNOSIS — I442 Atrioventricular block, complete: Secondary | ICD-10-CM | POA: Insufficient documentation

## 2018-01-29 DIAGNOSIS — Z880 Allergy status to penicillin: Secondary | ICD-10-CM | POA: Insufficient documentation

## 2018-01-29 DIAGNOSIS — K210 Gastro-esophageal reflux disease with esophagitis: Secondary | ICD-10-CM | POA: Diagnosis not present

## 2018-01-29 HISTORY — PX: FOREIGN BODY REMOVAL: SHX962

## 2018-01-29 HISTORY — PX: ESOPHAGOGASTRODUODENOSCOPY (EGD) WITH PROPOFOL: SHX5813

## 2018-01-29 LAB — COMPREHENSIVE METABOLIC PANEL
ALBUMIN: 3 g/dL — AB (ref 3.5–5.0)
ALT: 13 U/L (ref 0–44)
ANION GAP: 10 (ref 5–15)
AST: 22 U/L (ref 15–41)
Alkaline Phosphatase: 64 U/L (ref 38–126)
BUN: 28 mg/dL — ABNORMAL HIGH (ref 8–23)
CO2: 25 mmol/L (ref 22–32)
Calcium: 9.4 mg/dL (ref 8.9–10.3)
Chloride: 104 mmol/L (ref 98–111)
Creatinine, Ser: 1.42 mg/dL — ABNORMAL HIGH (ref 0.61–1.24)
GFR, EST AFRICAN AMERICAN: 50 mL/min — AB (ref >60)
GFR, EST NON AFRICAN AMERICAN: 43 mL/min — AB (ref >60)
Glucose, Bld: 145 mg/dL — ABNORMAL HIGH (ref 70–99)
POTASSIUM: 4.1 mmol/L (ref 3.5–5.1)
Sodium: 139 mmol/L (ref 135–145)
Total Bilirubin: 0.9 mg/dL (ref 0.3–1.2)
Total Protein: 7.3 g/dL (ref 6.5–8.1)

## 2018-01-29 LAB — CBC WITH DIFFERENTIAL/PLATELET
BASOS ABS: 0 10*3/uL (ref 0.0–0.1)
Basophils Relative: 0 %
Eosinophils Absolute: 0.3 10*3/uL (ref 0.0–0.7)
Eosinophils Relative: 1 %
HEMATOCRIT: 42.3 % (ref 39.0–52.0)
HEMOGLOBIN: 13.3 g/dL (ref 13.0–17.0)
LYMPHS PCT: 7 %
Lymphs Abs: 1.9 10*3/uL (ref 0.7–4.0)
MCH: 28.7 pg (ref 26.0–34.0)
MCHC: 31.4 g/dL (ref 30.0–36.0)
MCV: 91.4 fL (ref 78.0–100.0)
MONOS PCT: 4 %
Monocytes Absolute: 1.1 10*3/uL — ABNORMAL HIGH (ref 0.1–1.0)
NEUTROS PCT: 88 %
Neutro Abs: 23.3 10*3/uL — ABNORMAL HIGH (ref 1.7–7.7)
Platelets: 349 10*3/uL (ref 150–400)
RBC: 4.63 MIL/uL (ref 4.22–5.81)
RDW: 13.2 % (ref 11.5–15.5)
Smear Review: ADEQUATE
WBC: 26.6 10*3/uL — AB (ref 4.0–10.5)

## 2018-01-29 SURGERY — ESOPHAGOGASTRODUODENOSCOPY (EGD) WITH PROPOFOL
Anesthesia: Monitor Anesthesia Care

## 2018-01-29 MED ORDER — PROPOFOL 10 MG/ML IV BOLUS
INTRAVENOUS | Status: DC | PRN
  Administered 2018-01-29: 100 mg via INTRAVENOUS

## 2018-01-29 MED ORDER — LACTATED RINGERS IV SOLN
INTRAVENOUS | Status: DC | PRN
  Administered 2018-01-29: 15:00:00 via INTRAVENOUS

## 2018-01-29 MED ORDER — ONDANSETRON HCL 4 MG/2ML IJ SOLN
INTRAMUSCULAR | Status: DC | PRN
  Administered 2018-01-29: 4 mg via INTRAVENOUS

## 2018-01-29 MED ORDER — LIDOCAINE 2% (20 MG/ML) 5 ML SYRINGE
INTRAMUSCULAR | Status: DC | PRN
  Administered 2018-01-29: 50 mg via INTRAVENOUS

## 2018-01-29 MED ORDER — GLUCAGON HCL RDNA (DIAGNOSTIC) 1 MG IJ SOLR
1.0000 mg | Freq: Once | INTRAMUSCULAR | Status: AC
  Administered 2018-01-29: 1 mg via INTRAVENOUS
  Filled 2018-01-29: qty 1

## 2018-01-29 MED ORDER — SUCCINYLCHOLINE CHLORIDE 20 MG/ML IJ SOLN
INTRAMUSCULAR | Status: DC | PRN
  Administered 2018-01-29: 140 mg via INTRAVENOUS

## 2018-01-29 MED ORDER — SODIUM CHLORIDE 0.9 % IV BOLUS
1000.0000 mL | Freq: Once | INTRAVENOUS | Status: AC
  Administered 2018-01-29: 1000 mL via INTRAVENOUS

## 2018-01-29 SURGICAL SUPPLY — 14 items

## 2018-01-29 NOTE — ED Notes (Signed)
ED Provider at bedside. 

## 2018-01-29 NOTE — Anesthesia Preprocedure Evaluation (Addendum)
Anesthesia Evaluation  Patient identified by MRN, date of birth, ID band Patient awake    Reviewed: Allergy & Precautions, H&P , NPO status , Patient's Chart, lab work & pertinent test results  Airway Mallampati: II   Neck ROM: full    Dental   Pulmonary former smoker,    breath sounds clear to auscultation       Cardiovascular hypertension, + CAD, + Past MI and + Peripheral Vascular Disease  + dysrhythmias + pacemaker  Rhythm:irregular Rate:Normal     Neuro/Psych PSYCHIATRIC DISORDERS Anxiety CVA    GI/Hepatic PUD, GERD  ,Esophageal food impaction   Endo/Other    Renal/GU Renal InsufficiencyRenal disease     Musculoskeletal   Abdominal   Peds  Hematology   Anesthesia Other Findings   Reproductive/Obstetrics                             Anesthesia Physical Anesthesia Plan  ASA: IV  Anesthesia Plan: MAC   Post-op Pain Management:    Induction: Intravenous  PONV Risk Score and Plan: 1 and Ondansetron, Propofol infusion and Treatment may vary due to age or medical condition  Airway Management Planned: Nasal Cannula  Additional Equipment:   Intra-op Plan:   Post-operative Plan:   Informed Consent: I have reviewed the patients History and Physical, chart, labs and discussed the procedure including the risks, benefits and alternatives for the proposed anesthesia with the patient or authorized representative who has indicated his/her understanding and acceptance.     Plan Discussed with: CRNA, Anesthesiologist and Surgeon  Anesthesia Plan Comments:         Anesthesia Quick Evaluation

## 2018-01-29 NOTE — Transfer of Care (Signed)
Immediate Anesthesia Transfer of Care Note  Patient: Dakota [name]  Procedure(s) Performed: ESOPHAGOGASTRODUODENOSCOPY (EGD) WITH PROPOFOL (N/A ) FOREIGN BODY REMOVAL  Patient Location: Endo   Anesthesia Type:General  Level of Consciousness: drowsy, patient cooperative and responds to stimulation  Airway & Oxygen Therapy: Patient Spontanous Breathing  Post-op Assessment: Report given to RN and Post -op Vital signs reviewed and stable  Post vital signs: Reviewed and stable  Last Vitals:  Vitals Value Taken Time  BP 126/67 01/29/2018  3:56 PM  Temp 37.1 C 01/29/2018  3:56 PM  Pulse 112 01/29/2018  4:00 PM  Resp 24 01/29/2018  4:00 PM  SpO2 93 % 01/29/2018  4:00 PM  Vitals shown include unvalidated device data.  Last Pain:  Vitals:   01/29/18 1556  TempSrc: Oral  PainSc: 0-No pain         Complications: No apparent anesthesia complications

## 2018-01-29 NOTE — Interval H&P Note (Signed)
History and Physical Interval Note:  01/29/2018 4:04 PM  Dakota [name]  has presented today for surgery, with the diagnosis of food impaction  The various methods of treatment have been discussed with the patient and family. After consideration of risks, benefits and other options for treatment, the patient has consented to  Procedure(s): ESOPHAGOGASTRODUODENOSCOPY (EGD) WITH PROPOFOL (N/A) FOREIGN BODY REMOVAL as a surgical intervention .  The patient's history has been reviewed, patient examined, no change in status, stable for surgery.  I have reviewed the patient's chart and labs.  Questions were answered to the patient's satisfaction.     Nancy Fetter

## 2018-01-29 NOTE — ED Notes (Signed)
Placed pt on 2L [place] for O2 of 89%.

## 2018-01-29 NOTE — ED Triage Notes (Signed)
Pt to ER for evaluation of difficulty swallowing, reports hx of hiatal hernia, x2 days hasn't been able to pass any po fluids or food. O2 sats 90%, no hx of COPD. Patient denies sob or difficulty breathing. Pt appears comfortable.

## 2018-01-29 NOTE — ED Provider Notes (Signed)
[facility] EMERGENCY DEPARTMENT Provider Note   CSN: [id number] Arrival date & time: 01/29/18  0915     History   Chief Complaint Chief Complaint  Patient presents with  . Dysphagia    HPI Dakota [name] is a 84 y.o. male.  Patient w hx cva, c/o being unable to swallow for the past 2 days. Hx same, ?due to esophageal narrowing/food impaction. For past 2 days, when tries to swallow, the liquids come back up several seconds later. Pt very poor historian - ?due to prior stroke - level 5 caveat.  Son indicates the last time this happened they had to go in with scope to relieve the food blockage. Denies sob. No fevers. No chest pain. +generally weak. Sees [facility]/Dr Oletta Lamas.   The history is provided by the patient and a relative. The history is limited by the condition of the patient.    Past Medical History:  Diagnosis Date  . Anxiety   . Back pain, chronic   . Complete heart block ([place]) 07/18/2014   Medtronic [place] model [id number] (serial number [id number])  singe lead PPM  . Esophageal obstruction due to food impaction 2017  . GERD (gastroesophageal reflux disease) 10/2002  . Hyperlipidemia 12/1994  . Hypertension   . Hypokalemia   . Lung collapse 03/1991   Fall  . Permanent atrial fibrillation ([place])    Refused coumadin therapy    Patient Active Problem List   Diagnosis Date Noted  . Insomnia 01/11/2018  . Urinary frequency   . Dysphagia due to recent stroke   . Sleep disturbance   . Sundowning   . Labile blood pressure   . Constipation   . History of GI bleed   . Acute blood loss anemia   . Delirium   . GIB (gastrointestinal bleeding) 11/24/2017  . Hypotension 11/24/2017  . Hypovolemic shock ([place]) 11/24/2017  . Leucocytosis   . Hypoalbuminemia due to protein-calorie malnutrition ([place])   . Prediabetes   . Prerenal azotemia   . Subacute delirium 11/21/2017  . Dysphagia due to recent cerebrovascular accident 11/21/2017  . Chewing tobacco  nicotine dependence 11/21/2017  . Leukocytosis 11/21/2017  . Acute ischemic right MCA stroke ([place]) 11/21/2017  . Cerebrovascular accident (CVA) due to thrombosis of right middle cerebral artery ([place])   . Acute deep vein thrombosis (DVT) of proximal vein of lower extremity ([name])   . AKI (acute kidney injury) ([place])   . Middle cerebral artery embolism, right s/p endovascular therapy 11/16/2017  . Caregiver stress 10/05/2015  . Hematuria 04/06/2015  . ARF (acute renal failure) ([place]) 07/18/2014  . Cardiogenic shock ([place]) 07/18/2014  . Atrial fibrillation with slow ventricular response ([place])   . NSTEMI (non-ST elevated myocardial infarction) ([place])   . CHB (complete heart block) - s/p MDT 1 lead PPM 07/17/2014  . Advance care planning 07/03/2014  . Sebaceous cyst 04/11/2013  . Medicare annual wellness visit, subsequent 10/28/2012  . RIB PAIN, RIGHT SIDED 01/02/2009  . CAD, NATIVE VESSEL 11/25/2008  . Unspecified vitamin D deficiency 11/24/2008  . RECENT RET DETACH PARTIAL W/SINGLE DEFECT 11/24/2008  . Essential hypertension, benign 07/21/2008  . HLD (hyperlipidemia) 05/22/2007  . Atrial fibrillation ([place]) 05/22/2007  . GERD 05/22/2007  . GASTRIC ULCER, ACUTE, HEMORRHAGE 05/22/2007  . Hyperglycemia 05/22/2007    Past Surgical History:  Procedure Laterality Date  . BACK SURGERY  11/91   with hardware fixation  . BIOPSY  11/27/2017   Procedure: BIOPSY;  Surgeon: Laurence Spates, MD;  Location: [facility] ENDOSCOPY;  Service: Endoscopy;;  . CARDIAC CATHETERIZATION  06/2004   30 % stenosis, EF normal  . ESOPHAGOGASTRODUODENOSCOPY N/A 01/06/2016   Procedure: ESOPHAGOGASTRODUODENOSCOPY (EGD);  Surgeon: Otis Brace, MD;  Location: [facility];  Service: Gastroenterology;  Laterality: N/A;  . ESOPHAGOGASTRODUODENOSCOPY N/A 11/27/2017   Procedure: ESOPHAGOGASTRODUODENOSCOPY (EGD);  Surgeon: Laurence Spates, MD;  Location: [facility] ENDOSCOPY;  Service: Endoscopy;  Laterality: N/A;  . EYE SURGERY     Retinal  bubble surgery, cataract   . IR CT HEAD LTD  11/16/2017  . IR PERCUTANEOUS ART THROMBECTOMY/INFUSION INTRACRANIAL INC DIAG ANGIO  11/16/2017  . LEFT HEART CATHETERIZATION WITH CORONARY ANGIOGRAM N/A 07/18/2014   Procedure: LEFT HEART CATHETERIZATION WITH CORONARY ANGIOGRAM;  Surgeon: Jettie Booze, MD; [facility] [place], LAD mild dz, D1 80%, D2 OK, CFX system OK, RCA OK, PDA 100%, med rx  . PERMANENT PACEMAKER INSERTION N/A 07/18/2014   Procedure: PERMANENT PACEMAKER INSERTION;  Surgeon: Thompson Grayer, MD; Medtronic [place] model [phone] (serial number [id number])    . RADIOLOGY WITH ANESTHESIA N/A 11/16/2017   Procedure: RADIOLOGY WITH ANESTHESIA;  Surgeon: Luanne Bras, MD;  Location: [facility];  Service: Radiology;  Laterality: N/A;  . TEMPORARY PACEMAKER INSERTION N/A 07/18/2014   Procedure: TEMPORARY PACEMAKER INSERTION;  Surgeon: Peter M Martinique, MD;  Location[facility] CATH LAB;  Service: Cardiovascular;  Laterality: N/A;        Home Medications    Prior to Admission medications   Medication Sig Start Date End Date Taking? Authorizing Provider  famotidine (PEPCID) 20 MG tablet Take 1 tablet (20 mg total) by mouth 2 (two) times daily. 12/26/17   Love, Ivan Anchors, PA-C  Melatonin 3 MG TABS Take by mouth. Take 1 tablet (3 mg total) by mouth at bedtime as needed.    [provider]  polyethylene glycol powder (MIRALAX) powder 17 grams 1-2 times a day as needed for constipation. 01/08/18   Tonia Ghent, MD  senna-docusate (SENOKOT-S) 8.6-50 MG tablet Take 2 tablets by mouth 2 (two) times daily. 12/26/17   Love, Ivan Anchors, PA-C  traZODone (DESYREL) 50 MG tablet Take 0.5-1 tablets (25-50 mg total) by mouth at bedtime as needed for sleep. 01/17/18   Tonia Ghent, MD    Family History Family History  Problem Relation Age of Onset  . Pneumonia Mother   . Hip fracture Mother   . Cancer Father        splenic  . Heart disease Sister        Pacer placed  . Cancer Brother        throat and lung    . Cancer Brother        prostate, age 48  . Prostate cancer Brother   . Heart disease Son   . Stroke Neg Hx   . Colon cancer Neg Hx     Social History Social History   Tobacco Use  . Smoking status: Former Smoker    Packs/day: 1.00    Years: 15.00    Pack years: 15.00    Last attempt to quit: 05/02/1953    Years since quitting: 64.7  . Smokeless tobacco: Current User    Types: Chew  . Tobacco comment: Quit smoking over 50 years ago  Substance Use Topics  . Alcohol use: No  . Drug use: No     Allergies   Warfarin and related; Atorvastatin; Ezetimibe; Diazepam; and Penicillins   Review of Systems Review of Systems  Constitutional: Negative for fever.  HENT: Positive  for trouble swallowing. Negative for sore throat.   Eyes: Negative for redness.  Respiratory: Negative for shortness of breath.   Cardiovascular: Negative for chest pain.  Gastrointestinal: Negative for abdominal pain.  Genitourinary: Negative for flank pain.  Musculoskeletal: Negative for neck pain.  Skin: Negative for rash.  Neurological: Negative for headaches.  Hematological: Does not bruise/bleed easily.  Psychiatric/Behavioral: Negative for confusion.     Physical Exam Updated Vital Signs BP 111/62   Pulse (!) 105   Temp 97.7 F (36.5 C) (Oral)   Resp (!) 28   SpO2 95% Comment: 2L [place]   Physical Exam  Constitutional: He appears well-developed and well-nourished.  HENT:  Mouth/Throat: Oropharynx is clear and moist.  Eyes: Conjunctivae are normal.  Neck: Neck supple. No tracheal deviation present.  Cardiovascular: Normal rate, regular rhythm, normal heart sounds and intact distal pulses. Exam reveals no gallop and no friction rub.  No murmur heard. Pulmonary/Chest: Effort normal and breath sounds normal. No accessory muscle usage. No respiratory distress.  Abdominal: Soft. Bowel sounds are normal. He exhibits no distension. There is no tenderness.  Musculoskeletal: He exhibits no edema.   Neurological: He is alert.  Skin: Skin is warm and dry.  Psychiatric: He has a normal mood and affect.  Nursing note and vitals reviewed.    ED Treatments / Results  Labs (all labs ordered are listed, but only abnormal results are displayed) Results for orders placed or performed during the hospital encounter of 01/29/18  Comprehensive metabolic panel  Result Value Ref Range   Sodium 139 135 - 145 mmol/L   Potassium 4.1 3.5 - 5.1 mmol/L   Chloride 104 98 - 111 mmol/L   CO2 25 22 - 32 mmol/L   Glucose, Bld 145 (H) 70 - 99 mg/dL   BUN 28 (H) 8 - 23 mg/dL   Creatinine, Ser 1.42 (H) 0.61 - 1.24 mg/dL   Calcium 9.4 8.9 - 10.3 mg/dL   Total Protein 7.3 6.5 - 8.1 g/dL   Albumin 3.0 (L) 3.5 - 5.0 g/dL   AST 22 15 - 41 U/L   ALT 13 0 - 44 U/L   Alkaline Phosphatase 64 38 - 126 U/L   Total Bilirubin 0.9 0.3 - 1.2 mg/dL   GFR calc non Af Amer 43 (L) >60 mL/min   GFR calc Af Amer 50 (L) >60 mL/min   Anion gap 10 5 - 15   Dg Chest 2 View  Result Date: 01/29/2018 CLINICAL DATA:  Decreased oxygen saturation EXAM: CHEST - 2 VIEW COMPARISON:  December 18, 2017 FINDINGS: There is patchy infiltrate in the right base region. Lungs elsewhere are clear. The heart size and pulmonary vascularity are normal. Pacemaker leads attached to right ventricle. No adenopathy. There is postoperative change in the visualized upper lumbar region. IMPRESSION: Infiltrate right base. Question pneumonia versus aspiration in this area. Lungs elsewhere clear. Heart size within normal limits. Pacemaker lead attached to right ventricle. No adenopathy. Electronically Signed   By: Lowella Grip III M.D.   On: 01/29/2018 10:27    EKG None  Radiology Dg Chest 2 View  Result Date: 01/29/2018 CLINICAL DATA:  Decreased oxygen saturation EXAM: CHEST - 2 VIEW COMPARISON:  December 18, 2017 FINDINGS: There is patchy infiltrate in the right base region. Lungs elsewhere are clear. The heart size and pulmonary vascularity are  normal. Pacemaker leads attached to right ventricle. No adenopathy. There is postoperative change in the visualized upper lumbar region. IMPRESSION: Infiltrate right base. Question pneumonia  versus aspiration in this area. Lungs elsewhere clear. Heart size within normal limits. Pacemaker lead attached to right ventricle. No adenopathy. Electronically Signed   By: Lowella Grip III M.D.   On: 01/29/2018 10:27    Procedures Procedures (including critical care time)  Medications Ordered in ED Medications  glucagon (human recombinant) (GLUCAGEN) injection 1 mg (has no administration in time range)  sodium chloride 0.9 % bolus 1,000 mL (has no administration in time range)     Initial Impression / Assessment and Plan / ED Course  I have reviewed the triage vital signs and the nursing notes.  Pertinent labs & imaging results that were available during my care of the patient were reviewed by me and considered in my medical decision making (see chart for details).  Iv ns bolus. Glucagon iv. Labs sent.  Gi consulted.   Reviewed nursing notes and prior charts for additional history.   Labs reviewed - chem normal.  Pt unable to tolerate po - refluxes back up.  No change in symptoms w glucagon.   Discussed w Dr Oletta Lamas - he will arrange endoscopy.    Final Clinical Impressions(s) / ED Diagnoses   Final diagnoses:  None    ED Discharge Orders    None       Lajean Saver, MD 01/29/18 1142

## 2018-01-29 NOTE — Discharge Instructions (Addendum)
YOU HAD AN ENDOSCOPIC PROCEDURE TODAY: Refer to the procedure report and other information in the discharge instructions given to you for any specific questions about what was found during the examination. If this information does not answer your questions, please call [facility] office at [phone] to clarify.   YOU SHOULD EXPECT: Some feelings of bloating in the abdomen. Passage of more gas than usual. Walking can help get rid of the air that was put into your GI tract during the procedure and reduce the bloating. If you had a lower endoscopy (such as a colonoscopy or flexible sigmoidoscopy) you may notice spotting of blood in your stool or on the toilet paper. Some abdominal soreness may be present for a day or two, also.  DIET: Your first meal following the procedure should be a light meal and then it is ok to progress to your normal diet. A half-sandwich or bowl of soup is an example of a good first meal. Heavy or fried foods are harder to digest and may make you feel nauseous or bloated. Drink plenty of fluids but you should avoid alcoholic beverages for 24 hours. If you had a esophageal dilation, please see attached instructions for diet.   ACTIVITY: Your care partner should take you home directly after the procedure. You should plan to take it easy, moving slowly for the rest of the day. You can resume normal activity the day after the procedure however YOU SHOULD NOT DRIVE, use power tools, machinery or perform tasks that involve climbing or major physical exertion for 24 hours (because of the sedation medicines used during the test).   SYMPTOMS TO REPORT IMMEDIATELY: A gastroenterologist can be reached at any hour. Please call [phone]  for any of the following symptoms:   Following lower endoscopy (colonoscopy, flexible sigmoidoscopy) Excessive amounts of blood in the stool  Significant tenderness, worsening of abdominal pains  Swelling of the abdomen that is new, acute  Fever of 100  or higher   Following upper endoscopy (EGD, EUS, ERCP, esophageal dilation) Vomiting of blood or coffee ground material  New, significant abdominal pain  New, significant chest pain or pain under the shoulder blades  Painful or persistently difficult swallowing  New shortness of breath  Black, tarry-looking or red, bloody stools  FOLLOW UP:  If any biopsies were taken you will be contacted by phone or by letter within the next 1-3 weeks. Call [phone]  if you have not heard about the biopsies in 3 weeks.  Please also call with any specific questions about appointments or follow up tests. Clear liquids only for 1 day

## 2018-01-29 NOTE — Anesthesia Postprocedure Evaluation (Signed)
Anesthesia Post Note  Patient: Dakota [name]  Procedure(s) Performed: ESOPHAGOGASTRODUODENOSCOPY (EGD) WITH PROPOFOL (N/A ) FOREIGN BODY REMOVAL     Patient location during evaluation: Endoscopy Anesthesia Type: MAC Level of consciousness: awake and alert Pain management: pain level controlled Vital Signs Assessment: post-procedure vital signs reviewed and stable Respiratory status: spontaneous breathing, nonlabored ventilation, respiratory function stable and patient connected to nasal cannula oxygen Cardiovascular status: blood pressure returned to baseline and stable Postop Assessment: no apparent nausea or vomiting Anesthetic complications: no    Last Vitals:  Vitals:   01/29/18 1635 01/29/18 1645  BP: 116/83 109/84  Pulse: 94 (!) 104  Resp: (!) 25 (!) 25  Temp:    SpO2: 93% 91%    Last Pain:  Vitals:   01/29/18 1556  TempSrc: Oral  PainSc: 0-No pain                 Jonique Kulig COKER

## 2018-01-29 NOTE — Consult Note (Signed)
[facility] CONSULT Reason for consult:food impaction Referring Physician: emergency room  Dakota [name] is an 84 y.o. male.  HPI: he is well known to our practice. He has had at least one occasion with food impaction that had to be removed endoscopically. He was scope by myself several months ago without is clear stricture seen. There was a question of food impaction but all we found was esophagitis without a food impaction. According to his son who is within today he was eating ham on Saturday it's been unable to swallow since that time. Up until then he was eating well without any problems..  Past Medical History:  Diagnosis Date  . Anxiety   . Back pain, chronic   . Complete heart block ([place]) 07/18/2014   Medtronic O[place]ander model [id number] (serial number [id number])  singe lead PPM  . Esophageal obstruction due to food impaction 2017  . GERD (gastroesophageal reflux disease) 10/2002  . Hyperlipidemia 12/1994  . Hypertension   . Hypokalemia   . Lung collapse 03/1991   Fall  . Permanent atrial fibrillation ([name])    Refused coumadin therapy    Past Surgical History:  Procedure Laterality Date  . BACK SURGERY  11/91   with hardware fixation  . BIOPSY  11/27/2017   Procedure: BIOPSY;  Surgeon: Laurence Spates, MD;  Location: [facility];  Service: Endoscopy;;  . CARDIAC CATHETERIZATION  06/2004   30 % stenosis, EF normal  . ESOPHAGOGASTRODUODENOSCOPY N/A 01/06/2016   Procedure: ESOPHAGOGASTRODUODENOSCOPY (EGD);  Surgeon: Otis Brace, MD;  Location: [facility];  Service: Gastroenterology;  Laterality: N/A;  . ESOPHAGOGASTRODUODENOSCOPY N/A 11/27/2017   Procedure: ESOPHAGOGASTRODUODENOSCOPY (EGD);  Surgeon: Laurence Spates, MD;  Location: [facility] ENDOSCOPY;  Service: Endoscopy;  Laterality: N/A;  . EYE SURGERY     Retinal bubble surgery, cataract   . IR CT HEAD LTD  11/16/2017  . IR PERCUTANEOUS ART THROMBECTOMY/INFUSION INTRACRANIAL INC DIAG ANGIO  11/16/2017  . LEFT HEART  CATHETERIZATION WITH CORONARY ANGIOGRAM N/A 07/18/2014   Procedure: LEFT HEART CATHETERIZATION WITH CORONARY ANGIOGRAM;  Surgeon: Jettie Booze, MD; [facility] [place], LAD mild dz, D1 80%, D2 OK, CFX system OK, RCA OK, PDA 100%, med rx  . PERMANENT PACEMAKER INSERTION N/A 07/18/2014   Procedure: PERMANENT PACEMAKER INSERTION;  Surgeon: Thompson Grayer, MD; [employer] [place] model [id number] (serial number [id number])    . RADIOLOGY WITH ANESTHESIA N/A 11/16/2017   Procedure: RADIOLOGY WITH ANESTHESIA;  Surgeon: Luanne Bras, MD;  Location: [facility];  Service: Radiology;  Laterality: N/A;  . TEMPORARY PACEMAKER INSERTION N/A 07/18/2014   Procedure: TEMPORARY PACEMAKER INSERTION;  Surgeon: Peter M Martinique, MD;  Location: [facility] CATH LAB;  Service: Cardiovascular;  Laterality: N/A;    Family History  Problem Relation Age of Onset  . Pneumonia Mother   . Hip fracture Mother   . Cancer Father        splenic  . Heart disease Sister        Pacer placed  . Cancer Brother        throat and lung  . Cancer Brother        prostate, age 32  . Prostate cancer Brother   . Heart disease Son   . Stroke Neg Hx   . Colon cancer Neg Hx     Social History:  reports that he quit smoking about 64 years ago. He has a 15.00 pack-year smoking history. His smokeless tobacco use includes chew. He reports that he does not drink alcohol or use drugs.  Allergies:  Allergies  Allergen Reactions  . Warfarin And Related Other (See Comments)    Lost a lot of weight and also experienced numbness  . Atorvastatin Other (See Comments)    REACTION: MUSCLE PAIN  . Ezetimibe Other (See Comments)    REACTION: MUSCLE PAIN  . Diazepam Other (See Comments)    UNKNOWN, per pt  . Penicillins Other (See Comments)    UNKNOWN, per pt    Medications; Prior to Admission medications   Medication Sig Start Date End Date Taking? Authorizing Provider  Melatonin 3 MG TABS Take 1.5 mg by mouth at bedtime. Take 1 tablet (3 mg total) by mouth at  bedtime as needed.    Yes [provider]  mirtazapine (REMERON SOL-TAB) 15 MG disintegrating tablet Take 7.5 mg by mouth at bedtime.   Yes [provider]  omeprazole (PRILOSEC) 20 MG capsule Take 20 mg by mouth daily.   Yes [provider]  polyethylene glycol powder (MIRALAX) powder 17 grams 1-2 times a day as needed for constipation. 01/08/18  Yes Tonia Ghent, MD  famotidine (PEPCID) 20 MG tablet Take 1 tablet (20 mg total) by mouth 2 (two) times daily. Patient not taking: Reported on 01/29/2018 12/26/17   Love, Ivan Anchors, PA-C  senna-docusate (SENOKOT-S) 8.6-50 MG tablet Take 2 tablets by mouth 2 (two) times daily. Patient not taking: Reported on 01/29/2018 12/26/17   Love, Ivan Anchors, PA-C  traZODone (DESYREL) 50 MG tablet Take 0.5-1 tablets (25-50 mg total) by mouth at bedtime as needed for sleep. Patient not taking: Reported on 01/29/2018 01/17/18   Tonia Ghent, MD    PRN Meds  Results for orders placed or performed during the hospital encounter of 01/29/18 (from the past 48 hour(s))  Comprehensive metabolic panel     Status: Abnormal   Collection Time: 01/29/18  9:33 AM  Result Value Ref Range   Sodium 139 135 - 145 mmol/L   Potassium 4.1 3.5 - 5.1 mmol/L   Chloride 104 98 - 111 mmol/L   CO2 25 22 - 32 mmol/L   Glucose, Bld 145 (H) 70 - 99 mg/dL   BUN 28 (H) 8 - 23 mg/dL   Creatinine, Ser 1.42 (H) 0.61 - 1.24 mg/dL   Calcium 9.4 8.9 - 10.3 mg/dL   Total Protein 7.3 6.5 - 8.1 g/dL   Albumin 3.0 (L) 3.5 - 5.0 g/dL   AST 22 15 - 41 U/L   ALT 13 0 - 44 U/L   Alkaline Phosphatase 64 38 - 126 U/L   Total Bilirubin 0.9 0.3 - 1.2 mg/dL   GFR calc non Af Amer 43 (L) >60 mL/min   GFR calc Af Amer 50 (L) >60 mL/min    Comment: (NOTE) The eGFR has been calculated using the CKD EPI equation. This calculation has not been validated in all clinical situations. eGFR's persistently <60 mL/min signify possible Chronic Kidney Disease.    Anion gap 10 5 - 15     Comment: Performed at [facility] [address], [place], [place] [address]  CBC with Differential     Status: Abnormal   Collection Time: 01/29/18  9:33 AM  Result Value Ref Range   WBC 26.6 (H) 4.0 - 10.5 K/uL   RBC 4.63 4.22 - 5.81 MIL/uL   Hemoglobin 13.3 13.0 - 17.0 g/dL   HCT 42.3 39.0 - 52.0 %   MCV 91.4 78.0 - 100.0 fL   MCH 28.7 26.0 - 34.0 pg  MCHC 31.4 30.0 - 36.0 g/dL   RDW 13.2 11.5 - 15.5 %   Platelets 349 150 - 400 K/uL   Neutrophils Relative % 88 %   Lymphocytes Relative 7 %   Monocytes Relative 4 %   Eosinophils Relative 1 %   Basophils Relative 0 %   Neutro Abs 23.3 (H) 1.7 - 7.7 K/uL   Lymphs Abs 1.9 0.7 - 4.0 K/uL   Monocytes Absolute 1.1 (H) 0.1 - 1.0 K/uL   Eosinophils Absolute 0.3 0.0 - 0.7 K/uL   Basophils Absolute 0.0 0.0 - 0.1 K/uL   Smear Review      PLATELET CLUMPS NOTED ON SMEAR, COUNT APPEARS ADEQUATE    Comment: MORPHOLOGY UNREMARKABLE Performed at [facility] [address], [place], [place] [address]     Dg Chest 2 View  Result Date: 01/29/2018 CLINICAL DATA:  Decreased oxygen saturation EXAM: CHEST - 2 VIEW COMPARISON:  December 18, 2017 FINDINGS: There is patchy infiltrate in the right base region. Lungs elsewhere are clear. The heart size and pulmonary vascularity are normal. Pacemaker leads attached to right ventricle. No adenopathy. There is postoperative change in the visualized upper lumbar region. IMPRESSION: Infiltrate right base. Question pneumonia versus aspiration in this area. Lungs elsewhere clear. Heart size within normal limits. Pacemaker lead attached to right ventricle. No adenopathy. Electronically Signed   By: Lowella Grip III M.D.   On: 01/29/2018 10:27               Blood pressure 104/65, pulse (!) 109, temperature 97.7 F (36.5 C), temperature source Oral, resp. rate (!) 23, SpO2 94 %.  Physical exam:   General--elderly white male who answers questions but seems confused is quite pleasant  and no distress ENT--throats normal with dry mucous membranes Neck--supple Heart--regular rate and rhythm Lungs--clear Abdomen--soft and nontender Psych--pleasantly confused   Assessment: 1. Probable food impaction 2. History of esophagitis 3. Permanent atrial fibrillation no anticoagulation  Plan: Will proceed today with EGD and removal of food impaction. This is been discussed in detail with the patient and his son.   Nancy Fetter 01/29/2018, 2:11 PM   This note was created using voice recognition software and minor errors may Have occurred unintentionally. Pager: [phone] If no answer or after hours call [phone]

## 2018-01-29 NOTE — H&P (View-Only) (Signed)
[facility] CON[facility] Reason for consult:food impaction Referring Physician: emergency room  Dakota [name] is an 84 y.o. male.  HPI: he is well known to our practice. He has had at least one occasion with food impaction that had to be removed endoscopically. He was scope by myself several months ago without is clear stricture seen. There was a question of food impaction but all we found was esophagitis without a food impaction. According to his son who is within today he was eating ham on Saturday it's been unable to swallow since that time. Up until then he was eating well without any problems..  Past Medical History:  Diagnosis Date  . Anxiety   . Back pain, chronic   . Complete heart block ([place]) 07/18/2014   [employer]tronic [place] model [id number] (serial number [id number])  singe lead PPM  . Esophageal obstruction due to food impaction 2017  . GERD (gastroesophageal reflux disease) 10/2002  . Hyperlipidemia 12/1994  . Hypertension   . Hypokalemia   . Lung collapse 03/1991   Fall  . Permanent atrial fibrillation ([name])    Refused coumadin therapy    Past Surgical History:  Procedure Laterality Date  . BACK SURGERY  11/91   with hardware fixation  . BIOPSY  11/27/2017   Procedure: BIOPSY;  Surgeon: Laurence Spates, MD;  Location: [facility];  Service: Endoscopy;;  . CARDIAC CATHETERIZATION  06/2004   30 % stenosis, EF normal  . ESOPHAGOGASTRODUODENOSCOPY N/A 01/06/2016   Procedure: ESOPHAGOGASTRODUODENOSCOPY (EGD);  Surgeon: Otis Brace, MD;  Location: [facility];  Service: Gastroenterology;  Laterality: N/A;  . ESOPHAGOGASTRODUODENOSCOPY N/A 11/27/2017   Procedure: ESOPHAGOGASTRODUODENOSCOPY (EGD);  Surgeon: Laurence Spates, MD;  Location: [facility] ENDOSCOPY;  Service: Endoscopy;  Laterality: N/A;  . EYE SURGERY     Retinal bubble surgery, cataract   . IR CT HEAD LTD  11/16/2017  . IR PERCUTANEOUS ART THROMBECTOMY/INFUSION INTRACRANIAL INC DIAG ANGIO  11/16/2017  . LEFT HEART  CATHETERIZATION WITH CORONARY ANGIOGRAM N/A 07/18/2014   Procedure: LEFT HEART CATHETERIZATION WITH CORONARY ANGIOGRAM;  Surgeon: Jettie Booze, MD; [facility] [place], LAD mild dz, D1 80%, D2 OK, CFX system OK, RCA OK, PDA 100%, med rx  . PERMANENT PACEMAKER INSERTION N/A 07/18/2014   Procedure: PERMANENT PACEMAKER INSERTION;  Surgeon: Thompson Grayer, MD; [employer] [place] model [id number] (serial number [id number])    . RADIOLOGY WITH ANESTHESIA N/A 11/16/2017   Procedure: RADIOLOGY WITH ANESTHESIA;  Surgeon: Luanne Bras, MD;  Location: [facility];  Service: Radiology;  Laterality: N/A;  . TEMPORARY PACEMAKER INSERTION N/A 07/18/2014   Procedure: TEMPORARY PACEMAKER INSERTION;  Surgeon: Peter M Martinique, MD;  Location: [facility] CATH LAB;  Service: Cardiovascular;  Laterality: N/A;    Family History  Problem Relation Age of Onset  . Pneumonia Mother   . Hip fracture Mother   . Cancer Father        splenic  . Heart disease Sister        Pacer placed  . Cancer Brother        throat and lung  . Cancer Brother        prostate, age 32  . Prostate cancer Brother   . Heart disease Son   . Stroke Neg Hx   . Colon cancer Neg Hx     Social History:  reports that he quit smoking about 64 years ago. He has a 15.00 pack-year smoking history. His smokeless tobacco use includes chew. He reports that he does not drink alcohol or use drugs.  Allergies:  Allergies  Allergen Reactions  . Warfarin And Related Other (See Comments)    Lost a lot of weight and also experienced numbness  . Atorvastatin Other (See Comments)    REACTION: MUSCLE PAIN  . Ezetimibe Other (See Comments)    REACTION: MUSCLE PAIN  . Diazepam Other (See Comments)    UNKNOWN, per pt  . Penicillins Other (See Comments)    UNKNOWN, per pt    Medications; Prior to Admission medications   Medication Sig Start Date End Date Taking? Authorizing Provider  Melatonin 3 MG TABS Take 1.5 mg by mouth at bedtime. Take 1 tablet (3 mg total) by mouth at  bedtime as needed.    Yes [provider]  mirtazapine (REMERON SOL-TAB) 15 MG disintegrating tablet Take 7.5 mg by mouth at bedtime.   Yes [provider]  omeprazole (PRILOSEC) 20 MG capsule Take 20 mg by mouth daily.   Yes [provider]  polyethylene glycol powder (MIRALAX) powder 17 grams 1-2 times a day as needed for constipation. 01/08/18  Yes Tonia Ghent, MD  famotidine (PEPCID) 20 MG tablet Take 1 tablet (20 mg total) by mouth 2 (two) times daily. Patient not taking: Reported on 01/29/2018 12/26/17   Love, Ivan Anchors, PA-C  senna-docusate (SENOKOT-S) 8.6-50 MG tablet Take 2 tablets by mouth 2 (two) times daily. Patient not taking: Reported on 01/29/2018 12/26/17   Love, Ivan Anchors, PA-C  traZODone (DESYREL) 50 MG tablet Take 0.5-1 tablets (25-50 mg total) by mouth at bedtime as needed for sleep. Patient not taking: Reported on 01/29/2018 01/17/18   Tonia Ghent, MD    PRN Meds  Results for orders placed or performed during the hospital encounter of 01/29/18 (from the past 48 hour(s))  Comprehensive metabolic panel     Status: Abnormal   Collection Time: 01/29/18  9:33 AM  Result Value Ref Range   Sodium 139 135 - 145 mmol/L   Potassium 4.1 3.5 - 5.1 mmol/L   Chloride 104 98 - 111 mmol/L   CO2 25 22 - 32 mmol/L   Glucose, Bld 145 (H) 70 - 99 mg/dL   BUN 28 (H) 8 - 23 mg/dL   Creatinine, Ser 1.42 (H) 0.61 - 1.24 mg/dL   Calcium 9.4 8.9 - 10.3 mg/dL   Total Protein 7.3 6.5 - 8.1 g/dL   Albumin 3.0 (L) 3.5 - 5.0 g/dL   AST 22 15 - 41 U/L   ALT 13 0 - 44 U/L   Alkaline Phosphatase 64 38 - 126 U/L   Total Bilirubin 0.9 0.3 - 1.2 mg/dL   GFR calc non Af Amer 43 (L) >60 mL/min   GFR calc Af Amer 50 (L) >60 mL/min    Comment: (NOTE) The eGFR has been calculated using the CKD EPI equation. This calculation has not been validated in all clinical situations. eGFR's persistently <60 mL/min signify possible Chronic Kidney Disease.    Anion gap 10 5 - 15     Comment: Performed at [facility] [address], [place], [place] [address]  CBC with Differential     Status: Abnormal   Collection Time: 01/29/18  9:33 AM  Result Value Ref Range   WBC 26.6 (H) 4.0 - 10.5 K/uL   RBC 4.63 4.22 - 5.81 MIL/uL   Hemoglobin 13.3 13.0 - 17.0 g/dL   HCT 42.3 39.0 - 52.0 %   MCV 91.4 78.0 - 100.0 fL   MCH 28.7 26.0 - 34.0 pg  MCHC 31.4 30.0 - 36.0 g/dL   RDW 13.2 11.5 - 15.5 %   Platelets 349 150 - 400 K/uL   Neutrophils Relative % 88 %   Lymphocytes Relative 7 %   Monocytes Relative 4 %   Eosinophils Relative 1 %   Basophils Relative 0 %   Neutro Abs 23.3 (H) 1.7 - 7.7 K/uL   Lymphs Abs 1.9 0.7 - 4.0 K/uL   Monocytes Absolute 1.1 (H) 0.1 - 1.0 K/uL   Eosinophils Absolute 0.3 0.0 - 0.7 K/uL   Basophils Absolute 0.0 0.0 - 0.1 K/uL   Smear Review      PLATELET CLUMPS NOTED ON SMEAR, COUNT APPEARS ADEQUATE    Comment: MORPHOLOGY UNREMARKABLE Performed at [facility] [address], [place], [place] [address]     Dg Chest 2 View  Result Date: 01/29/2018 CLINICAL DATA:  Decreased oxygen saturation EXAM: CHEST - 2 VIEW COMPARISON:  December 18, 2017 FINDINGS: There is patchy infiltrate in the right base region. Lungs elsewhere are clear. The heart size and pulmonary vascularity are normal. Pacemaker leads attached to right ventricle. No adenopathy. There is postoperative change in the visualized upper lumbar region. IMPRESSION: Infiltrate right base. Question pneumonia versus aspiration in this area. Lungs elsewhere clear. Heart size within normal limits. Pacemaker lead attached to right ventricle. No adenopathy. Electronically Signed   By: Lowella Grip III M.D.   On: 01/29/2018 10:27               Blood pressure 104/65, pulse (!) 109, temperature 97.7 F (36.5 C), temperature source Oral, resp. rate (!) 23, SpO2 94 %.  Physical exam:   General--elderly white male who answers questions but seems confused is quite pleasant  and no distress ENT--throats normal with dry mucous membranes Neck--supple Heart--regular rate and rhythm Lungs--clear Abdomen--soft and nontender Psych--pleasantly confused   Assessment: 1. Probable food impaction 2. History of esophagitis 3. Permanent atrial fibrillation no anticoagulation  Plan: Will proceed today with EGD and removal of food impaction. This is been discussed in detail with the patient and his son.   Nancy Fetter 01/29/2018, 2:11 PM   This note was created using voice recognition software and minor errors may Have occurred unintentionally. Pager: [phone] If no answer or after hours call [phone]

## 2018-01-29 NOTE — Anesthesia Procedure Notes (Signed)
Procedure Name: Intubation Date/Time: 01/29/2018 3:23 PM Performed by: Oletta Lamas, CRNA Pre-anesthesia Checklist: Patient identified, Emergency Drugs available, Suction available and Patient being monitored Patient Re-evaluated:Patient Re-evaluated prior to induction Oxygen Delivery Method: Circle System Utilized Preoxygenation: Pre-oxygenation with 100% oxygen Induction Type: IV induction Ventilation: Mask ventilation without difficulty Laryngoscope Size: Miller and 3 Grade View: Grade I Tube type: Oral Number of attempts: 1 Airway Equipment and Method: Stylet Placement Confirmation: ETT inserted through vocal cords under direct vision,  positive ETCO2 and breath sounds checked- equal and bilateral Secured at: 22 cm Tube secured with: Tape Dental Injury: Teeth and Oropharynx as per pre-operative assessment

## 2018-01-29 NOTE — ED Notes (Signed)
Son at bedside reports that pt has had new trouble swallowing since Saturday. Pt is aware that he is "clogged". Son reports history d/t hiatal hernia. Son also reports increased coughing.

## 2018-01-29 NOTE — Op Note (Signed)
[facility] Patient Name: Dakota [name] Procedure Date : 01/29/2018 MRN: [id number] Attending MD: Nancy Fetter Dr., MD Date of Birth: [birth year] CSN: [id number] Age: 84 Admit Type: Outpatient Procedure:                Upper GI endoscopy Indications:              Esophageal dysphagia, history of prior food                            impaction. Been unable to swallow after eating ham                            about 3 days ago. Providers:                Joyice Faster. Zamere Pasternak Dr., MD, Cleda Daub, RN, Cherylynn Ridges, Technician, Alan Mulder, Technician,                            Gala Lewandowsky, CRNA Referring MD:              Medicines:                Monitored Anesthesia Care Complications:            No immediate complications. Estimated Blood Loss:     Estimated blood loss: none. Procedure:                Pre-Anesthesia Assessment:                           - Prior to the procedure, a History and Physical                            was performed, and patient medications and                            allergies were reviewed. The patient's tolerance of                            previous anesthesia was also reviewed. The risks                            and benefits of the procedure and the sedation                            options and risks were discussed with the patient.                            All questions were answered, and informed consent                            was obtained. Prior Anticoagulants: The patient has  taken no previous anticoagulant or antiplatelet                            agents. ASA Grade Assessment: IV - A patient with                            severe systemic disease that is a constant threat                            to life. After reviewing the risks and benefits,                            the patient was deemed in satisfactory condition to                            undergo the  procedure.                           After obtaining informed consent, the endoscope was                            passed under direct vision. Throughout the                            procedure, the patient's blood pressure, pulse, and                            oxygen saturations were monitored continuously. The                            GIF-H190 ([id number]) Olympus Adult EGD was introduced                            through the mouth, and advanced to the second part                            of duodenum. The upper GI endoscopy was somewhat                            difficult due to presence of food. the patient had                            had food in his esophagus and been unable to                            swallow for 72 hours. He was spitting up liquids.                            For this reason we decided to go ahead and do this                            with the patient entubated in order to protect his  airway. Scope In: Scope Out: Findings:      Food was found in the distal esophagus. Removal of food was       accomplished. multiple passages of the scope were used. The Quadra pod       retriever was used as was the net to remove pieces of food. So large       need impaction that had to be broken apart and multiple pieces. After       the large pieces were removed were able to push the remainder through       into the stomach and wash the esophagus to cleared of all in the       remaining food.      Moderately severe esophagitis was found at the gastroesophageal       junction. there were several shallow esophageal ulcers. There was no       stricture in the GE junction was actually fairly widely paid.      The stomach was normal.      The examined duodenum was normal. Impression:               - Food in the distal esophagus. Removal was                            successful.                           - Moderately severe reflux esophagitis.                            - Normal stomach.                           - Normal examined duodenum. Recommendation:           - Patient has a contact number available for                            emergencies. The signs and symptoms of potential                            delayed complications were discussed with the                            patient. Return to normal activities tomorrow.                            Written discharge instructions were provided to the                            patient.                           - Continue present medications.                           - Clear liquid diet for 1 day.                           - Observe  patient's clinical course.                           - Return to endoscopist PRN. Procedure Code(s):        --- Professional ---                           [phone], Esophagogastroduodenoscopy, flexible,                            transoral; with removal of foreign body(s) Diagnosis Code(s):        --- Professional ---                           X43.568S, Food in esophagus causing other injury,                            initial encounter                           K21.0, Gastro-esophageal reflux disease with                            esophagitis                           R13.14, Dysphagia, pharyngoesophageal phase CPT copyright 2017 American Medical Association. All rights reserved. The codes documented in this report are preliminary and upon coder review may  be revised to meet current compliance requirements. Nancy Fetter Dr., MD 01/29/2018 3:57:16 PM This report has been signed electronically. Number of Addenda: 0

## 2018-01-30 ENCOUNTER — Encounter (HOSPITAL_COMMUNITY): Payer: Self-pay | Admitting: Gastroenterology

## 2018-02-01 ENCOUNTER — Other Ambulatory Visit: Payer: Medicare Other

## 2018-02-01 ENCOUNTER — Other Ambulatory Visit: Payer: Self-pay | Admitting: Family Medicine

## 2018-02-01 DIAGNOSIS — I4891 Unspecified atrial fibrillation: Secondary | ICD-10-CM

## 2018-02-05 ENCOUNTER — Ambulatory Visit: Payer: Medicare Other

## 2018-02-05 ENCOUNTER — Encounter: Payer: Medicare Other | Admitting: Family Medicine

## 2018-02-06 ENCOUNTER — Ambulatory Visit (INDEPENDENT_AMBULATORY_CARE_PROVIDER_SITE_OTHER): Payer: Medicare Other | Admitting: *Deleted

## 2018-02-06 DIAGNOSIS — I442 Atrioventricular block, complete: Secondary | ICD-10-CM

## 2018-02-07 NOTE — Progress Notes (Signed)
Remote pacemaker transmission.   

## 2018-02-09 ENCOUNTER — Ambulatory Visit (INDEPENDENT_AMBULATORY_CARE_PROVIDER_SITE_OTHER): Payer: Medicare Other | Admitting: Adult Health

## 2018-02-09 ENCOUNTER — Encounter: Payer: Self-pay | Admitting: Adult Health

## 2018-02-09 VITALS — BP 97/57 | HR 89 | Ht 67.0 in | Wt 154.0 lb

## 2018-02-09 DIAGNOSIS — I63311 Cerebral infarction due to thrombosis of right middle cerebral artery: Secondary | ICD-10-CM | POA: Diagnosis not present

## 2018-02-09 DIAGNOSIS — E785 Hyperlipidemia, unspecified: Secondary | ICD-10-CM

## 2018-02-09 DIAGNOSIS — I480 Paroxysmal atrial fibrillation: Secondary | ICD-10-CM

## 2018-02-09 DIAGNOSIS — Z8719 Personal history of other diseases of the digestive system: Secondary | ICD-10-CM | POA: Diagnosis not present

## 2018-02-09 MED ORDER — ASPIRIN EC 325 MG PO TBEC: 325.0000 mg | DELAYED_RELEASE_TABLET | Freq: Every day | ORAL | 0 refills | Status: DC

## 2018-02-09 NOTE — Progress Notes (Signed)
[facility] [address] [place]. [place] [address] [phone]       OFFICE FOLLOW UP NOTE  Mr. YOEL KAUFHOLD Date of Birth:  [birth year] Medical Record Number:  [id number]   Reason for Referral:  hospital stroke follow up  CHIEF COMPLAINT:  Chief Complaint  Patient presents with  . Follow-up    Follow up from hospital room 9 pt with  Angelita Ingles and Ruel his son and daughter     HPI: REBEL WILLCUTT is being seen today for initial visit in the office for right MCA infarct embolic secondary to AF not on [facility] s/p TICI 2B revascularization of right MCA M2 on 11/16/2017. History obtained from patient, daughter, son and chart review. Reviewed all radiology images and labs personally.  Mr.Renardo L Wyrickis a 84 y.o.malewith history of permanent atrial fibrillation (not anticoagulated ), status post Medtronic permanent pacemaker for complete heart block, hypertension, hyperlipidemia, chronic back pain, and anxietypresenting with left-sided weakness and right gaze preference upon awakening in the morning.  Per notes, he went to bed the night before normal without the symptoms present. Hdid not receive IV t-PA due to late presentation.  CT head reviewed and showed acute to subacute infarct in the right basal ganglia, external capsule and posterior operculum along with right distal MCA branch hypodensity near the operculum likely reflecting thrombus.  CTA head and neck showed likely right M3 occlusion.  CT perfusion suggests posterior right MCA territory penumbra.  Due to these findings, patient underwent right M2 occlusion s/p IR with TICI 2B reperfusion with Dr. Estanislado Pandy.  MRI was unable to be obtained due to PPM.  Repeat CT head showed propagation of acute right MCA territory infarct now involving right insula and right temporoparietal lobes along with similar findings of right basal ganglia acute infarct and dense distal right insular MCA consistent with thromboembolism.  2D  echo showed an EF of 65 to 70% without cardiac source of embolus.  LDL 102 and it was recommended to trial pravastatin as patient has history of myalgias with atorvastatin and Zetia.  HTN normalized throughout admission and recommended long-term BP goal normotensive range.  A1c 6.1.  Patient does have a history of atrial fibrillation in which he opted out of use of anticoagulation for management, was recommended to start Eliquis twice daily at discharge as it was felt his recent infarcts were embolic due to AF not on AC.  Unfortunately, patient underwent hospital delirium and therefore was started on Seroquel along with Depakote to assist with agitation.  Therapies recommended inpatient rehab CIR for continued deficits of left neglect, right gaze preference, left hemiparesis and dysphagia.  Unfortunately during CIR admission, patient was noted to have maroon stools due to GI bleed as he underwent EGD revealing on severe gastritis in the lower third of esophagus, moderate inflammation with erosion and erythema gastric antrum and one partially obstructing nonbleeding cratered duodenal ulcer.  He was treated with IV Protonix with multiple PRBC transfusions.  IV heparin was started and again had another episode of bloody stools therefore anticoagulation was discontinued due to recurrent GIB.  Patient was discharged home on 12/26/2017 as he had made improvements in CIR and activity tolerance, balance, postural control as well as improvement in functional use of LUE and LLE and was able to complete ADL tasks with minimal assistance and able to ambulate with rolling walker. Patient is being seen today for hospital follow-up and is accompanied by daughter and son.  On 01/29/2018, patient was evaluated  in the ED for possible food impaction as he was unable to swallow since eating him 2 days prior.  He underwent EGD with removal of food impaction.  It was recommended to do home therapies after inpatient rehab but they were  discontinued due to uncooperation of patient.  Per daughter, he was either sleeping when they came and would not get out of bed to participate or he would fall asleep during sessions.  Patient does have some underlying dementia and son and daughter do believe that has worsened some since his stroke.  He continues to live at home with his wife but does receive 24-hour supervision/assistance.  He does continue to have LUE weakness along with dysarthria but per daughter, his swallowing has been stable and they ensure that all meals are caught up small to avoid food impaction.  He does use wheelchair for long distance but is able to ambulate with rolling walker.  He denies any recent falls.  Patient was drowsy during visit is at one-point he did tripped off but was easily aroused.  Daughter and son both state that he has daytime sleepiness, possible restless leg and snoring.  Due to cognitive concerns and underlying dementia, if patient is found to have OSA he will most likely not be able to tolerate mask therefore not recommended to undergo OSA work-up at this time.  He has not been restarted on any antithrombotic/anticoagulation since discharge due to recent GI bleed.  It was recommended to start pravastatin but daughter states after discharge from CIR, there is no mention of statin or cholesterol medication on discharge list.  Blood pressure today satisfactory for patient 97/57.  No further concerns at this time.  Denies new or worsening stroke/TIA symptoms.    ROS:   14 system review of systems performed and negative with exception of activity change, fatigue, hearing loss, drooling, blurred vision, murmur, constipation, restless leg, daytime sleepiness, sleep talking, back pain, memory loss, numbness, speech difficulty, agitation, confusion, decreased concentration and hallucinations  PMH:  Past Medical History:  Diagnosis Date  . Anxiety   . Back pain, chronic   . Complete heart block ([place]) 07/18/2014     Medtronic [place] model [id number] (serial number [id number])  singe lead PPM  . Esophageal obstruction due to food impaction 2017  . GERD (gastroesophageal reflux disease) 10/2002  . Hyperlipidemia 12/1994  . Hypertension   . Hypokalemia   . Lung collapse 03/1991   Fall  . Permanent atrial fibrillation    Refused coumadin therapy  . Stroke [facility])     PSH:  Past Surgical History:  Procedure Laterality Date  . BACK SURGERY  11/91   with hardware fixation  . BIOPSY  11/27/2017   Procedure: BIOPSY;  Surgeon: Laurence Spates, MD;  Location: [place];  Service: Endoscopy;;  . CARDIAC CATHETERIZATION  06/2004   30 % stenosis, EF normal  . ESOPHAGOGASTRODUODENOSCOPY N/A 01/06/2016   Procedure: ESOPHAGOGASTRODUODENOSCOPY (EGD);  Surgeon: Otis Brace, MD;  Location: [place];  Service: Gastroenterology;  Laterality: N/A;  . ESOPHAGOGASTRODUODENOSCOPY N/A 11/27/2017   Procedure: ESOPHAGOGASTRODUODENOSCOPY (EGD);  Surgeon: Laurence Spates, MD;  Location: [facility] ENDOSCOPY;  Service: Endoscopy;  Laterality: N/A;  . ESOPHAGOGASTRODUODENOSCOPY (EGD) WITH PROPOFOL N/A 01/29/2018   Procedure: ESOPHAGOGASTRODUODENOSCOPY (EGD) WITH PROPOFOL;  Surgeon: Laurence Spates, MD;  Location: [place];  Service: Endoscopy;  Laterality: N/A;  . EYE SURGERY     Retinal bubble surgery, cataract   . FOREIGN BODY REMOVAL  01/29/2018   Procedure: FOREIGN BODY REMOVAL;  Surgeon: Laurence Spates, MD;  Location[facility] ENDOSCOPY;  Service: Endoscopy;;  . IR CT HEAD LTD  11/16/2017  . IR PERCUTANEOUS ART THROMBECTOMY/INFUSION INTRACRANIAL INC DIAG ANGIO  11/16/2017  . LEFT HEART CATHETERIZATION WITH CORONARY ANGIOGRAM N/A 07/18/2014   Procedure: LEFT HEART CATHETERIZATION WITH CORONARY ANGIOGRAM;  Surgeon: Jettie Booze, MD; [facility] [place], LAD mild dz, D1 80%, D2 OK, CFX system OK, RCA OK, PDA 100%, med rx  . PERMANENT PACEMAKER INSERTION N/A 07/18/2014   Procedure: PERMANENT PACEMAKER INSERTION;  Surgeon: Thompson Grayer, MD;  Medtronic [place] model [id number] (serial number [id number])    . RADIOLOGY WITH ANESTHESIA N/A 11/16/2017   Procedure: RADIOLOGY WITH ANESTHESIA;  Surgeon: Luanne Bras, MD;  Location: [place];  Service: Radiology;  Laterality: N/A;  . TEMPORARY PACEMAKER INSERTION N/A 07/18/2014   Procedure: TEMPORARY PACEMAKER INSERTION;  Surgeon: Peter M Martinique, MD;  Location: [facility] CATH LAB;  Service: Cardiovascular;  Laterality: N/A;    Social History:  Social History   Socioeconomic History  . Marital status: Married    Spouse name: Not on file  . Number of children: 4  . Years of education: Not on file  . Highest education level: Not on file  Occupational History  . Occupation: retired    Comment: Barista  . Financial resource strain: Not on file  . Food insecurity:    Worry: Not on file    Inability: Not on file  . Transportation needs:    Medical: Not on file    Non-medical: Not on file  Tobacco Use  . Smoking status: Former Smoker    Packs/day: 1.00    Years: 15.00    Pack years: 15.00    Last attempt to quit: 05/02/1953    Years since quitting: 64.8  . Smokeless tobacco: Current User    Types: Chew  . Tobacco comment: Quit smoking over 50 years ago  Substance and Sexual Activity  . Alcohol use: No  . Drug use: No  . Sexual activity: Never  Lifestyle  . Physical activity:    Days per week: Not on file    Minutes per session: Not on file  . Stress: Not on file  Relationships  . Social connections:    Talks on phone: Not on file    Gets together: Not on file    Attends religious service: Not on file    Active member of club or organization: Not on file    Attends meetings of clubs or organizations: Not on file    Relationship status: Not on file  . Intimate partner violence:    Fear of current or ex partner: Not on file    Emotionally abused: Not on file    Physically abused: Not on file    Forced sexual activity: Not on file  Other Topics Concern  .  Not on file  Social History Narrative   Retired from [employer], building/construction   Married 1953   4 kids   Caring for his wife at home- source of anxiety for patient    Family History:  Family History  Problem Relation Age of Onset  . Pneumonia Mother   . Hip fracture Mother   . Cancer Father        splenic  . Heart disease Sister        Pacer placed  . Cancer Brother        throat and lung  . Cancer Brother  prostate, age 54  . Prostate cancer Brother   . Heart disease Son   . Stroke Neg Hx   . Colon cancer Neg Hx     Medications:   Current Outpatient Medications on File Prior to Visit  Medication Sig Dispense Refill  . Melatonin 3 MG TABS Take 1.5 mg by mouth at bedtime. Take 1 tablet (3 mg total) by mouth at bedtime as needed.     [name] omeprazole (PRILOSEC) 20 MG capsule Take 20 mg by mouth daily.     No current facility-administered medications on file prior to visit.     Allergies:   Allergies  Allergen Reactions  . Warfarin And Related Other (See Comments)    Lost a lot of weight and also experienced numbness  . Atorvastatin Other (See Comments)    REACTION: MUSCLE PAIN  . Ezetimibe Other (See Comments)    REACTION: MUSCLE PAIN  . Diazepam Other (See Comments)    UNKNOWN, per pt  . Penicillins Other (See Comments)    UNKNOWN, per pt     Physical Exam  Vitals:   02/09/18 0923  BP: (!) 97/57  Pulse: 89  Weight: 154 lb (69.9 kg)  Height: 5\' 7"  (1.702 m)   Body mass index is 24.12 kg/m. No exam data present  General: well developed, well nourished, pleasant elderly Caucasian male, seated, in no evident distress Head: head normocephalic and atraumatic.   Neck: supple with no carotid or supraclavicular bruits Cardiovascular: regular rate and rhythm, no murmurs Musculoskeletal: no deformity Skin:  no rash/petichiae Vascular:  Normal pulses all extremities  Neurologic Exam Mental Status: Mild to moderate dysarthria.  Fatigue during  visit with eyes closed frequently and occasionally drifting off. Oriented to place but disoriented to time. Remote memory intact. Attention span, concentration and fund of knowledge appropriate. Mood and affect appropriate.  Cranial Nerves: Fundoscopic exam reveals sharp disc margins. Pupils equal, briskly reactive to light. Extraocular movements full without nystagmus. Visual fields full to confrontation. Hearing intact. Facial sensation intact. Face, tongue, palate moves normally and symmetrically.  Motor: Normal bulk and tone. Normal strength in all tested extremity muscles except for mild grip weakness left hand Sensory.: intact to touch , pinprick , position and vibratory sensation.  Coordination: Rapid alternating movements normal in all extremities. Finger-to-nose and heel-to-shin performed accurately bilaterally.  Orbits right arm over left arm.  Decreased left hand dexterity. Gait and Station: Patient currently sitting in wheelchair and has rolling walker not present at visit, gait assessment deferred Reflexes: 1+ and symmetric. Toes downgoing.    NIHSS  2 Modified Rankin  3 HAS-BLED 4 CHA2DS2-VASc 4   Diagnostic Data (Labs, Imaging, Testing)  Ct Head Wo Contrast 11/16/2017 Acute to subacute infarct in the right basal ganglia, external capsule, and posterior operculum. Right distal MCA branch hyperdensity near the operculum likely reflects thrombus.   Ct Angio Head W Or Wo Contrast Ct Angio Neck W Or Wo Contrast Ct Cerebral Perfusion W Contrast 11/16/2017 1. Negative for large vessel occlusion. No discrete right MCA branch occlusion is identified, but moderate to severe right M3 branch irregularity is noted.  2. CT perfusion suggests posterior right MCA territory penumbra, but does not detect core infarct with the standard parameters. However, a small 5 mL infarct is suggested with more sensitive CBF (<38%) parameter, located along the posterior operculum. Therefore, it is possible  that the right lentiform finding is chronic.  3. Mild for age atherosclerosis in the head and neck with no hemodynamically  significant arterial stenosis identified outside of the right M3 findings in #1.   Cerebral angiogram and Intervention - Dr Estanislado Pandy 11/16/2017 S/P RT common carotid arteriogram followed by endovascular revascularization of occluded RT MCA inf division distal M2 seg With x 1 pass with With 20mm x 20 mm trevoprovue retriever device achieving a TICI 2b plus revascularization.  CT Head WO Contrast 11/17/2017 1. Propagation of acute RIGHT MCA territory infarct now involving RIGHT insula and RIGHT temporoparietal lobes. Similar RIGHT basal ganglia acute infarct.No hemorrhagic conversion. 2. Dense distal RIGHT insular MCA consistent with thromboembolism.  Transthoracic Echocardiogram  11/17/17 Study Conclusions - Left ventricle: The cavity size was normal. There was moderateconcentric hypertrophy. Systolic function was vigorous. Theestimated ejection fraction was in the range of 65% to 70%.Wallmotion was normal; there were no regional wall motionabnormalities. - Aortic valve: Trileaflet; mildly thickened, mildly calcifiedleaflets. There was mild regurgitation. - Aortic root: The aortic root was normal in size. - Mitral valve: Calcified annulus. Mildly thickened leaflets.There was mild regurgitation. - Left atrium: The atrium was moderately dilated. - Right ventricle: Systolic function was normal. - Right atrium: The atrium was moderately dilated. - Tricuspid valve: There was mild regurgitation. - Pulmonary arteries: Systolic pressure was within the normalrange. - Inferior vena cava: The vessel was normal in size. - Pericardium, extracardiac: There was no pericardial effusion. Impressions: No cardiac source of emboli was indentified.   ASSESSMENT: AAHIL FREDIN is a 84 y.o. year old male here with embolic right MCA infarct s/p TICI 2B revascularization of  right MCA M2 on 11/16/2017 secondary to AF not on AC.  Patient was discharged to [facility] and unfortunately developed GIB and therefore was readmitted to [facility].  Attempt to restart [facility] but had recurrent GIB and therefore was recommended to not reattempt to start anticoagulation for atrial fibrillation.  Vascular risk factors include HTN, PAF, HLD and recurrent GIB on AC.     PLAN: -Recommend starting aspirin 325 EC daily as [place] VAS score 4 for secondary stroke prevention as patient is not a good candidate for [facility] due to recent and recurrent GIB -Daughter to schedule follow-up appointment with GI and will speak to them in regards to safety of starting statin and if okay with them, daughter plans to follow-up with PCP for repeat lipid panel and possibly starting treatment if needed -F/u with PCP regarding your HLD and HTN management/monitoring -Advised to do mind exercises such as crossword, word search, reading or card games -continue to monitor BP at home -advised to continue to stay active as tolerated and maintain a healthy diet -Maintain strict control of hypertension with blood pressure goal below 130/90, diabetes with hemoglobin A1c goal below 6.5% and cholesterol with LDL cholesterol (bad cholesterol) goal below 70 mg/dL. I also advised the patient to eat a healthy diet with plenty of whole grains, cereals, fruits and vegetables, exercise regularly and maintain ideal body weight.  Follow up in 3 months or call earlier if needed   Greater than 50% of time during this 25 minute visit was spent on counseling,explanation of diagnosis of embolic right MCA, reviewing risk factor management of HTN, HLD, PAF and GIB on [facility], planning of further management, discussion with patient and family and coordination of care    Venancio Poisson, AGNP-BC  [facility] [address] [place], [place] [address]  Phone [phone] Fax [phone] Note: This document was prepared  with digital dictation and possible smart phrase technology. Any transcriptional errors that result from this process  are unintentional.

## 2018-02-09 NOTE — Progress Notes (Signed)
I agree with the above plan 

## 2018-02-09 NOTE — Patient Instructions (Signed)
Start aspirin 325 mg daily enteric coated (EC) for secondary stroke prevention  Continue to follow up with PCP regarding cholesterol and blood pressure management   Schedule appointment with GI for continued stomach issues - please ask them if it is okay to start a cholesterol medication. If so, follow up with PCP regarding repeat labs along with possibly restarting cholesterol medication  Increase activity level as tolerated along with maintaining a healthy diet  Mind exercises - suduku, word search, cross word puzzles, reading and card games  Continue to monitor blood pressure at home  Maintain strict control of hypertension with blood pressure goal below 130/90, diabetes with hemoglobin A1c goal below 6.5% and cholesterol with LDL cholesterol (bad cholesterol) goal below 70 mg/dL. I also advised the patient to eat a healthy diet with plenty of whole grains, cereals, fruits and vegetables, exercise regularly and maintain ideal body weight.  Followup in the future with me in 3 months or call earlier if needed       Thank you for coming to see [place] at [facility]. I hope we have been able to provide you high quality care today.  You may receive a patient satisfaction survey over the next few weeks. We would appreciate your feedback and comments so that we may continue to improve ourselves and the health of our patients.

## 2018-02-15 ENCOUNTER — Encounter: Payer: Self-pay | Admitting: Cardiology

## 2018-02-19 ENCOUNTER — Telehealth: Payer: Self-pay

## 2018-02-19 ENCOUNTER — Encounter: Payer: Self-pay | Admitting: Adult Health

## 2018-02-19 NOTE — Telephone Encounter (Signed)
Rn call patients daughter about her recent mychart message.   RN call patients daughter Berrie at [phone]. Rn stated Janett Billow NP reviewed the [employer]. Rn stated Janett Billow NP recommend seeing his PCP for evaluation of trazodone can cause more confusion, and memory issues. The daughter stated GI recommend they stop giving pt the trazodone but they were desperate. They were desperate because he was not sleeping and up all time of the night. Rn stated they need to take the advice of the GI MD who recommend stopping the trazodone. Rn also stated if his having more confusion it could be potential new stroke and to seek the ED. The daughter verbalized understanding of the phone call, and was told to seek the ED because pt had recent stroke in 12/2017.

## 2018-02-19 NOTE — Telephone Encounter (Signed)
Dakota [name],  Please call daughter to advise if this is a sudden worsening of confusion, he should be evaluated in ED to rule out potential new stroke. If his memory has been slowly worsening over the past couple of days, this could be due to side effect of trazodone, recent stroke, lack of sleep or worsening of his dementia. I would advise to follow up with his PCP in regards to this concern. But again, if acute onset, please be evaluated in ED.   Janett Billow

## 2018-03-01 LAB — CUP PACEART REMOTE DEVICE CHECK
Battery Remaining Longevity: 75 mo
Battery Voltage: 2.75 V
Date Time Interrogation Session: 20191008151842
Implantable Lead Implant Date: 20160318
Implantable Lead Location: 753860
Implantable Lead Model: 5076
Implantable Pulse Generator Implant Date: 20160318
Lead Channel Pacing Threshold Amplitude: 1 V
Lead Channel Pacing Threshold Pulse Width: 0.4 ms
Lead Channel Setting Pacing Amplitude: 2.5 V
Lead Channel Setting Sensing Sensitivity: 2 mV
MDC IDC MSMT BATTERY IMPEDANCE: 821 Ohm
MDC IDC MSMT LEADCHNL RA IMPEDANCE VALUE: 0 Ohm
MDC IDC MSMT LEADCHNL RV IMPEDANCE VALUE: 536 Ohm
MDC IDC SET LEADCHNL RV PACING PULSEWIDTH: 0.4 ms
MDC IDC STAT BRADY RV PERCENT PACED: 10 %

## 2018-03-15 ENCOUNTER — Other Ambulatory Visit: Payer: Self-pay

## 2018-03-15 NOTE — Patient Outreach (Signed)
Telephone outreach to patient to obtain mRs was successfully completed. mRs= 5. Spoke with son on DPR due to patient being unable to answer any questions for himself.

## 2018-03-20 DIAGNOSIS — H40013 Open angle with borderline findings, low risk, bilateral: Secondary | ICD-10-CM | POA: Diagnosis not present

## 2018-03-20 DIAGNOSIS — H35342 Macular cyst, hole, or pseudohole, left eye: Secondary | ICD-10-CM | POA: Diagnosis not present

## 2018-03-20 DIAGNOSIS — H2511 Age-related nuclear cataract, right eye: Secondary | ICD-10-CM | POA: Diagnosis not present

## 2018-03-20 DIAGNOSIS — Z9842 Cataract extraction status, left eye: Secondary | ICD-10-CM | POA: Diagnosis not present

## 2018-03-20 DIAGNOSIS — H524 Presbyopia: Secondary | ICD-10-CM | POA: Diagnosis not present

## 2018-03-20 DIAGNOSIS — H52223 Regular astigmatism, bilateral: Secondary | ICD-10-CM | POA: Diagnosis not present

## 2018-03-20 DIAGNOSIS — H353131 Nonexudative age-related macular degeneration, bilateral, early dry stage: Secondary | ICD-10-CM | POA: Diagnosis not present

## 2018-05-08 ENCOUNTER — Ambulatory Visit (INDEPENDENT_AMBULATORY_CARE_PROVIDER_SITE_OTHER): Payer: Medicare Other

## 2018-05-08 DIAGNOSIS — I442 Atrioventricular block, complete: Secondary | ICD-10-CM

## 2018-05-09 NOTE — Progress Notes (Signed)
Remote pacemaker transmission.   

## 2018-05-10 LAB — CUP PACEART REMOTE DEVICE CHECK
Brady Statistic RV Percent Paced: 12 %
Implantable Lead Model: 5076
Lead Channel Impedance Value: 0 Ohm
Lead Channel Impedance Value: 476 Ohm
Lead Channel Pacing Threshold Amplitude: 0.875 V
Lead Channel Pacing Threshold Pulse Width: 0.4 ms
Lead Channel Setting Pacing Amplitude: 2.5 V
Lead Channel Setting Pacing Pulse Width: 0.4 ms
Lead Channel Setting Sensing Sensitivity: 2 mV
MDC IDC LEAD IMPLANT DT: 20160318
MDC IDC LEAD LOCATION: 753860
MDC IDC MSMT BATTERY IMPEDANCE: 899 Ohm
MDC IDC MSMT BATTERY REMAINING LONGEVITY: 71 mo
MDC IDC MSMT BATTERY VOLTAGE: 2.76 V
MDC IDC PG IMPLANT DT: 20160318
MDC IDC SESS DTM: 20200107144427

## 2018-05-17 ENCOUNTER — Ambulatory Visit (INDEPENDENT_AMBULATORY_CARE_PROVIDER_SITE_OTHER): Payer: Medicare Other | Admitting: Adult Health

## 2018-05-17 ENCOUNTER — Encounter: Payer: Self-pay | Admitting: Adult Health

## 2018-05-17 VITALS — BP 149/88 | HR 84 | Wt 160.4 lb

## 2018-05-17 DIAGNOSIS — F0151 Vascular dementia with behavioral disturbance: Secondary | ICD-10-CM

## 2018-05-17 DIAGNOSIS — I10 Essential (primary) hypertension: Secondary | ICD-10-CM

## 2018-05-17 DIAGNOSIS — F01518 Vascular dementia, unspecified severity, with other behavioral disturbance: Secondary | ICD-10-CM

## 2018-05-17 DIAGNOSIS — E785 Hyperlipidemia, unspecified: Secondary | ICD-10-CM

## 2018-05-17 DIAGNOSIS — I480 Paroxysmal atrial fibrillation: Secondary | ICD-10-CM | POA: Diagnosis not present

## 2018-05-17 DIAGNOSIS — I63311 Cerebral infarction due to thrombosis of right middle cerebral artery: Secondary | ICD-10-CM

## 2018-05-17 MED ORDER — MIRTAZAPINE 7.5 MG PO TABS: 7.5000 mg | ORAL_TABLET | Freq: Every day | ORAL | 2 refills | Status: DC

## 2018-05-17 NOTE — Patient Instructions (Signed)
Continue aspirin 325 mg daily for secondary stroke prevention  Continue to follow up with PCP regarding cholesterol and blood pressure management   Start Remeron 7.5mg  nightly - we can consider increasing after 2 weeks if needed - consider being evaluated by neuropsychology for a formal neurocognitive eval   Continue to monitor blood pressure at home  Maintain strict control of hypertension with blood pressure goal below 130/90, diabetes with hemoglobin A1c goal below 6.5% and cholesterol with LDL cholesterol (bad cholesterol) goal below 70 mg/dL. I also advised the patient to eat a healthy diet with plenty of whole grains, cereals, fruits and vegetables, exercise regularly and maintain ideal body weight.  Followup in the future with me in 3 months or call earlier if needed       Thank you for coming to see [place] at [facility]. I hope we have been able to provide you high quality care today.  You may receive a patient satisfaction survey over the next few weeks. We would appreciate your feedback and comments so that we may continue to improve ourselves and the health of our patients.

## 2018-05-17 NOTE — Progress Notes (Signed)
[facility] [address] [place]. [place] [address] [phone]       OFFICE FOLLOW UP NOTE  [name] Date of Birth:  07/11/1928 Medical Record Number:  [id number]   Reason for visit: Right MCA infarct follow-up  CHIEF COMPLAINT:  Chief Complaint  Patient presents with  . Follow-up    [facility] follow up room 9 with Dakota [name] daughter and Dakota [name] son pt is not resting and getting enough sleep when this happens he gets agitated at times    HPI:  Interval history 05/17/2018: Patient is being seen today for 83-month follow-up visit.  Patient has ongoing deficits of dysarthria, right hand weakness and cognitive deficits.  He is currently using a cane for ambulation but does have a walker if needed.  Daughter and son expressed frustrations of patient not sleeping throughout the night and confusion waxing/waning.  She states some days he will be okay and then other days he is consistently arguing with family along with paranoid behavior.  During visit, patient continued to state that his kids are trying to make him leave his own home and his daughter is stealing money from his bank account.  Further discussion revealed that funds were used to provide aide service and the account that the patient is referring to was close as a combined all forms into one account.  Daughter states this is a daily discussion and becomes tearful regarding accusations.  Patients son does provide supervision for patient patient throughout the day along with caring for patient's wife.  Son and daughter have recently hired aide service to assess further with patient's care.  He does continue on aspirin 325 mg without side effects of bleeding or bruising.  Blood pressure today 149/88.  No further concerns at this time.  Denies new or worsening stroke/TIA symptoms.    02/09/2018 visit: Patient is being seen today for hospital follow-up and is accompanied by daughter and son.  On 01/29/2018, patient was  evaluated in the ED for possible food impaction as he was unable to swallow since eating him 2 days prior.  He underwent EGD with removal of food impaction.  It was recommended to do home therapies after inpatient rehab but they were discontinued due to uncooperation of patient.  Per daughter, he was either sleeping when they came and would not get out of bed to participate or he would fall asleep during sessions.  Patient does have some underlying dementia and son and daughter do believe that has worsened some since his stroke.  He continues to live at home with his wife but does receive 24-hour supervision/assistance.  He does continue to have LUE weakness along with dysarthria but per daughter, his swallowing has been stable and they ensure that all meals are caught up small to avoid food impaction.  He does use wheelchair for long distance but is able to ambulate with rolling walker.  He denies any recent falls.  Patient was drowsy during visit is at one-point he did drift off but was easily aroused.  Daughter and son both state that he has daytime sleepiness, possible restless leg and snoring.  Due to cognitive concerns and underlying dementia, if patient is found to have OSA he will most likely not be able to tolerate mask therefore not recommended to undergo OSA work-up at this time.  He has not been restarted on any antithrombotic/anticoagulation since discharge due to recent GI bleed.  It was recommended to start pravastatin but daughter states after  discharge from CIR, there is no mention of statin or cholesterol medication on discharge list.  Blood pressure today satisfactory for patient 97/57.  No further concerns at this time.  Denies new or worsening stroke/TIA symptoms.  12/04/17 hospital admission: Mr.Dakota L Wyrickis a 85 y.o.malewith history of permanent atrial fibrillation (not anticoagulated ), status post Medtronic permanent pacemaker for complete heart block, hypertension, hyperlipidemia,  chronic back pain, and anxietypresenting with left-sided weakness and right gaze preference upon awakening in the morning.  Per notes, he went to bed the night before normal without the symptoms present. Hdid not receive IV t-PA due to late presentation.  CT head reviewed and showed acute to subacute infarct in the right basal ganglia, external capsule and posterior operculum along with right distal MCA branch hypodensity near the operculum likely reflecting thrombus.  CTA head and neck showed likely right M3 occlusion.  CT perfusion suggests posterior right MCA territory penumbra.  Due to these findings, patient underwent right M2 occlusion s/p IR with TICI 2B reperfusion with Dr. Estanislado Pandy.  MRI was unable to be obtained due to PPM.  Repeat CT head showed propagation of acute right MCA territory infarct now involving right insula and right temporoparietal lobes along with similar findings of right basal ganglia acute infarct and dense distal right insular MCA consistent with thromboembolism.  2D echo showed an EF of 65 to 70% without cardiac source of embolus.  LDL 102 and it was recommended to trial pravastatin as patient has history of myalgias with atorvastatin and Zetia.  HTN normalized throughout admission and recommended long-term BP goal normotensive range.  A1c 6.1.  Patient does have a history of atrial fibrillation in which he opted out of use of anticoagulation for management, was recommended to start Eliquis twice daily at discharge as it was felt his recent infarcts were embolic due to AF not on AC.  Unfortunately, patient underwent hospital delirium and therefore was started on Seroquel along with Depakote to assist with agitation.  Therapies recommended inpatient rehab CIR for continued deficits of left neglect, right gaze preference, left hemiparesis and dysphagia.  Unfortunately during CIR admission, patient was noted to have maroon stools due to GI bleed as he underwent EGD revealing on severe  gastritis in the lower third of esophagus, moderate inflammation with erosion and erythema gastric antrum and one partially obstructing nonbleeding cratered duodenal ulcer.  He was treated with IV Protonix with multiple PRBC transfusions.  IV heparin was started and again had another episode of bloody stools therefore anticoagulation was discontinued due to recurrent GIB.  Patient was discharged home on 12/26/2017 as he had made improvements in CIR and activity tolerance, balance, postural control as well as improvement in functional use of LUE and LLE and was able to complete ADL tasks with minimal assistance and able to ambulate with rolling walker.     ROS:   14 system review of systems performed and negative with exception of leg swelling, constipation, restless leg, insomnia, back pain, memory loss, weakness, agitation, behavior problem, confusion, decreased concentration, never/anxious and hallucinations    PMH:  Past Medical History:  Diagnosis Date  . Anxiety   . Back pain, chronic   . Complete heart block ([place]) 07/18/2014   Medtronic [place] model [id number] (serial number [id number])  singe lead PPM  . Esophageal obstruction due to food impaction 2017  . GERD (gastroesophageal reflux disease) 10/2002  . Hyperlipidemia 12/1994  . Hypertension   . Hypokalemia   . Lung collapse 03/1991  Fall  . Permanent atrial fibrillation    Refused coumadin therapy  . Stroke [facility])     PSH:  Past Surgical History:  Procedure Laterality Date  . BACK SURGERY  11/91   with hardware fixation  . BIOPSY  11/27/2017   Procedure: BIOPSY;  Surgeon: Laurence Spates, MD;  Location: [place];  Service: Endoscopy;;  . CARDIAC CATHETERIZATION  06/2004   30 % stenosis, EF normal  . ESOPHAGOGASTRODUODENOSCOPY N/A 01/06/2016   Procedure: ESOPHAGOGASTRODUODENOSCOPY (EGD);  Surgeon: Otis Brace, MD;  Location: [place];  Service: Gastroenterology;  Laterality: N/A;  . ESOPHAGOGASTRODUODENOSCOPY N/A  11/27/2017   Procedure: ESOPHAGOGASTRODUODENOSCOPY (EGD);  Surgeon: Laurence Spates, MD;  Location: [facility] ENDOSCOPY;  Service: Endoscopy;  Laterality: N/A;  . ESOPHAGOGASTRODUODENOSCOPY (EGD) WITH PROPOFOL N/A 01/29/2018   Procedure: ESOPHAGOGASTRODUODENOSCOPY (EGD) WITH PROPOFOL;  Surgeon: Laurence Spates, MD;  Location: [place];  Service: Endoscopy;  Laterality: N/A;  . EYE SURGERY     Retinal bubble surgery, cataract   . FOREIGN BODY REMOVAL  01/29/2018   Procedure: FOREIGN BODY REMOVAL;  Surgeon: Laurence Spates, MD;  Location: [place];  Service: Endoscopy;;  . IR CT HEAD LTD  11/16/2017  . IR PERCUTANEOUS ART THROMBECTOMY/INFUSION INTRACRANIAL INC DIAG ANGIO  11/16/2017  . LEFT HEART CATHETERIZATION WITH CORONARY ANGIOGRAM N/A 07/18/2014   Procedure: LEFT HEART CATHETERIZATION WITH CORONARY ANGIOGRAM;  Surgeon: Jettie Booze, MD; [facility] [place], LAD mild dz, D1 80%, D2 OK, CFX system OK, RCA OK, PDA 100%, med rx  . PERMANENT PACEMAKER INSERTION N/A 07/18/2014   Procedure: PERMANENT PACEMAKER INSERTION;  Surgeon: Thompson Grayer, MD; Medtronic Rio model [id number] (serial number [id number])    . RADIOLOGY WITH ANESTHESIA N/A 11/16/2017   Procedure: RADIOLOGY WITH ANESTHESIA;  Surgeon: Luanne Bras, MD;  Location: [place];  Service: Radiology;  Laterality: N/A;  . TEMPORARY PACEMAKER INSERTION N/A 07/18/2014   Procedure: TEMPORARY PACEMAKER INSERTION;  Surgeon: Peter M Martinique, MD;  Location: [facility] CATH LAB;  Service: Cardiovascular;  Laterality: N/A;    Social History:  Social History   Socioeconomic History  . Marital status: Married    Spouse name: Not on file  . Number of children: 4  . Years of education: Not on file  . Highest education level: Not on file  Occupational History  . Occupation: retired    Comment: Barista  . Financial resource strain: Not on file  . Food insecurity:    Worry: Not on file    Inability: Not on file  . Transportation needs:     Medical: Not on file    Non-medical: Not on file  Tobacco Use  . Smoking status: Former Smoker    Packs/day: 1.00    Years: 15.00    Pack years: 15.00    Last attempt to quit: 05/02/1953    Years since quitting: 65.0  . Smokeless tobacco: Current User    Types: Chew  . Tobacco comment: Quit smoking over 50 years ago  Substance and Sexual Activity  . Alcohol use: No  . Drug use: No  . Sexual activity: Never  Lifestyle  . Physical activity:    Days per week: Not on file    Minutes per session: Not on file  . Stress: Not on file  Relationships  . Social connections:    Talks on phone: Not on file    Gets together: Not on file    Attends religious service: Not on file    Active member of club or organization:  Not on file    Attends meetings of clubs or organizations: Not on file    Relationship status: Not on file  . Intimate partner violence:    Fear of current or ex partner: Not on file    Emotionally abused: Not on file    Physically abused: Not on file    Forced sexual activity: Not on file  Other Topics Concern  . Not on file  Social History Narrative   Retired from [employer], building/construction   Married 1953   4 kids   Caring for his wife at home- source of anxiety for patient    Family History:  Family History  Problem Relation Age of Onset  . Pneumonia Mother   . Hip fracture Mother   . Cancer Father        splenic  . Heart disease Sister        Pacer placed  . Cancer Brother        throat and lung  . Cancer Brother        prostate, age 71  . Prostate cancer Brother   . Heart disease Son   . Stroke Neg Hx   . Colon cancer Neg Hx     Medications:   Current Outpatient Medications on File Prior to Visit  Medication Sig Dispense Refill  . aspirin EC 325 MG tablet Take 1 tablet (325 mg total) by mouth daily. 30 tablet 0  . DOXYLAMINE-DM PO Take by mouth at bedtime.    . Melatonin 3 MG TABS Take 1.5 mg by mouth at bedtime. Take 1 tablet (3 mg  total) by mouth at bedtime as needed.     [name] omeprazole (PRILOSEC) 20 MG capsule Take 20 mg by mouth daily.     No current facility-administered medications on file prior to visit.     Allergies:   Allergies  Allergen Reactions  . Warfarin And Related Other (See Comments)    Lost a lot of weight and also experienced numbness  . Atorvastatin Other (See Comments)    REACTION: MUSCLE PAIN  . Ezetimibe Other (See Comments)    REACTION: MUSCLE PAIN  . Diazepam Other (See Comments)    UNKNOWN, per pt  . Penicillins Other (See Comments)    UNKNOWN, per pt     Physical Exam  Vitals:   05/17/18 1313  BP: (!) 149/88  Pulse: 84  Weight: 160 lb 6.4 oz (72.8 kg)   Body mass index is 25.12 kg/m. No exam data present  General: well developed, well nourished, pleasant elderly Caucasian male, seated, in no evident distress Head: head normocephalic and atraumatic.   Neck: supple with no carotid or supraclavicular bruits Cardiovascular: regular rate and rhythm, no murmurs Musculoskeletal: no deformity Skin:  no rash/petichiae Vascular:  Normal pulses all extremities  Neurologic Exam Mental Status: Mild to moderate dysarthria.  MMSE 17/30 with paranoid behaviors.  Patient cooperative with exam. MMSE - Mini Mental State Exam 05/17/2018 01/23/2017 09/23/2015  Orientation to time 3 5 5   Orientation to Place 4 5 5   Registration 3 3 3   Attention/ Calculation 0 0 0  Recall 2 2 0  Recall-comments - unable to recall 1 of 3 words unable to recall words  Language- name 2 objects 2 0 0  Language- repeat 0 1 1  Language- follow 3 step command 3 2 3   Language- follow 3 step command-comments - unable to follow 1 step of 3 step command multiple ques were given  Language-  read & follow direction 0 0 0  Write a sentence 0 0 0  Copy design 0 0 0  Total score 17 18 17    Cranial Nerves: Pupils equal, briskly reactive to light. Extraocular movements full without nystagmus. Visual fields full to  confrontation. Hearing intact. Facial sensation intact. Face, tongue, palate moves normally and symmetrically.  Motor: Normal bulk and tone. Normal strength in all tested extremity muscles except for mild grip weakness left hand Sensory.: intact to touch , pinprick , position and vibratory sensation.  Coordination: Rapid alternating movements normal in all extremities. Finger-to-nose and heel-to-shin performed accurately bilaterally.  Orbits right arm over left arm.  Decreased left hand dexterity. Gait and Station: Patient currently sitting in wheelchair and has rolling walker not present at visit, gait assessment deferred Reflexes: 1+ and symmetric. Toes downgoing.    NIHSS  2 Modified Rankin  3 HAS-BLED 4 CHA2DS2-VASc 4   Diagnostic Data (Labs, Imaging, Testing)  Ct Head Wo Contrast 11/16/2017 Acute to subacute infarct in the right basal ganglia, external capsule, and posterior operculum. Right distal MCA branch hyperdensity near the operculum likely reflects thrombus.   Ct Angio Head W Or Wo Contrast Ct Angio Neck W Or Wo Contrast Ct Cerebral Perfusion W Contrast 11/16/2017 1. Negative for large vessel occlusion. No discrete right MCA branch occlusion is identified, but moderate to severe right M3 branch irregularity is noted.  2. CT perfusion suggests posterior right MCA territory penumbra, but does not detect core infarct with the standard parameters. However, a small 5 mL infarct is suggested with more sensitive CBF (<38%) parameter, located along the posterior operculum. Therefore, it is possible that the right lentiform finding is chronic.  3. Mild for age atherosclerosis in the head and neck with no hemodynamically significant arterial stenosis identified outside of the right M3 findings in #1.   Cerebral angiogram and Intervention - Dr Estanislado Pandy 11/16/2017 S/P RT common carotid arteriogram followed by endovascular revascularization of occluded RT MCA inf division distal M2 seg  With x 1 pass with With 30mm x 20 mm trevoprovue retriever device achieving a TICI 2b plus revascularization.  CT Head WO Contrast 11/17/2017 1. Propagation of acute RIGHT MCA territory infarct now involving RIGHT insula and RIGHT temporoparietal lobes. Similar RIGHT basal ganglia acute infarct.No hemorrhagic conversion. 2. Dense distal RIGHT insular MCA consistent with thromboembolism.  Transthoracic Echocardiogram  11/17/17 Study Conclusions - Left ventricle: The cavity size was normal. There was moderateconcentric hypertrophy. Systolic function was vigorous. Theestimated ejection fraction was in the range of 65% to 70%.Wallmotion was normal; there were no regional wall motionabnormalities. - Aortic valve: Trileaflet; mildly thickened, mildly calcifiedleaflets. There was mild regurgitation. - Aortic root: The aortic root was normal in size. - Mitral valve: Calcified annulus. Mildly thickened leaflets.There was mild regurgitation. - Left atrium: The atrium was moderately dilated. - Right ventricle: Systolic function was normal. - Right atrium: The atrium was moderately dilated. - Tricuspid valve: There was mild regurgitation. - Pulmonary arteries: Systolic pressure was within the normalrange. - Inferior vena cava: The vessel was normal in size. - Pericardium, extracardiac: There was no pericardial effusion. Impressions: No cardiac source of emboli was indentified.   ASSESSMENT: Dakota [name] is a 85 y.o. year old male here with embolic right MCA infarct s/p TICI 2B revascularization of right MCA M2 on 11/16/2017 secondary to AF not on AC.  Patient was discharged to [facility] and unfortunately developed GIB and therefore was readmitted to [facility].  Attempt to restart [facility] but  had recurrent GIB and therefore was recommended to not reattempt to start anticoagulation for atrial fibrillation.  Vascular risk factors include HTN, PAF, HLD and recurrent GIB on AC.  Patient is being seen today for  follow-up visit and does have continued dysarthria, decreased left hand dexterity and cognitive deficits.    PLAN: -Continue aspirin 325 mg daily for secondary stroke prevention -Initiate Remeron 7.5 mg nightly to assist with behaviors secondary to dementia -Recommended patient be evaluated by neuropsychiatry versus adult psychiatry for further management of dementia behaviors.  Daughter declined at this time but she will call office in the future if she is interested in this referral -Provided information to daughter and son regarding [facility] services including day program and social worker to assist with further resources.  Daughter will call office if she is interested in this referral -Spoke with daughter in regards to patient's paranoid behaviors as this is most likely due to worsening of his dementia and that if Remeron does not help with his difficult behaviors, it is highly recommended to be seen by psychiatry for further management -F/u with PCP regarding your HLD and HTN management/monitoring -continue to monitor BP at home -advised to continue to stay active as tolerated and maintain a healthy diet -Maintain strict control of hypertension with blood pressure goal below 130/90, diabetes with hemoglobin A1c goal below 6.5% and cholesterol with LDL cholesterol (bad cholesterol) goal below 70 mg/dL. I also advised the patient to eat a healthy diet with plenty of whole grains, cereals, fruits and vegetables, exercise regularly and maintain ideal body weight.  Follow up in 3 months or call earlier if needed   Greater than 50% of time during this 25 minute visit was spent on counseling,explanation of diagnosis of embolic right MCA, reviewing risk factor management of HTN, HLD, PAF and GIB on [facility], planning of further management, discussion with patient and family regarding worsening of dementia and coordination of care    Venancio Poisson, AGNP-BC  [facility] [address] [place], [place] [address]  Phone [phone] Fax [phone] Note: This document was prepared with digital dictation and possible smart phrase technology. Any transcriptional errors that result from this process are unintentional.

## 2018-05-18 NOTE — Progress Notes (Signed)
I agree with the above plan 

## 2018-05-28 ENCOUNTER — Ambulatory Visit (INDEPENDENT_AMBULATORY_CARE_PROVIDER_SITE_OTHER): Payer: Medicare Other | Admitting: Family Medicine

## 2018-05-28 ENCOUNTER — Encounter: Payer: Self-pay | Admitting: Family Medicine

## 2018-05-28 ENCOUNTER — Encounter: Payer: Self-pay | Admitting: Adult Health

## 2018-05-28 ENCOUNTER — Other Ambulatory Visit: Payer: Self-pay | Admitting: Family Medicine

## 2018-05-28 ENCOUNTER — Ambulatory Visit (INDEPENDENT_AMBULATORY_CARE_PROVIDER_SITE_OTHER): Payer: Medicare Other

## 2018-05-28 VITALS — BP 110/60 | HR 85 | Temp 98.2°F | Ht 68.5 in | Wt 157.0 lb

## 2018-05-28 DIAGNOSIS — I4891 Unspecified atrial fibrillation: Secondary | ICD-10-CM

## 2018-05-28 DIAGNOSIS — K5900 Constipation, unspecified: Secondary | ICD-10-CM | POA: Diagnosis not present

## 2018-05-28 DIAGNOSIS — I69391 Dysphagia following cerebral infarction: Secondary | ICD-10-CM

## 2018-05-28 DIAGNOSIS — Z0000 Encounter for general adult medical examination without abnormal findings: Secondary | ICD-10-CM | POA: Diagnosis not present

## 2018-05-28 DIAGNOSIS — R21 Rash and other nonspecific skin eruption: Secondary | ICD-10-CM

## 2018-05-28 DIAGNOSIS — R739 Hyperglycemia, unspecified: Secondary | ICD-10-CM | POA: Diagnosis not present

## 2018-05-28 DIAGNOSIS — I63311 Cerebral infarction due to thrombosis of right middle cerebral artery: Secondary | ICD-10-CM

## 2018-05-28 DIAGNOSIS — I69311 Memory deficit following cerebral infarction: Secondary | ICD-10-CM

## 2018-05-28 DIAGNOSIS — E785 Hyperlipidemia, unspecified: Secondary | ICD-10-CM

## 2018-05-28 LAB — COMPREHENSIVE METABOLIC PANEL
ALBUMIN: 3.9 g/dL (ref 3.5–5.2)
ALT: 16 U/L (ref 0–53)
AST: 19 U/L (ref 0–37)
Alkaline Phosphatase: 82 U/L (ref 39–117)
BUN: 23 mg/dL (ref 6–23)
CALCIUM: 10.1 mg/dL (ref 8.4–10.5)
CHLORIDE: 104 meq/L (ref 96–112)
CO2: 31 mEq/L (ref 19–32)
Creatinine, Ser: 1.22 mg/dL (ref 0.40–1.50)
GFR: 56.14 mL/min — ABNORMAL LOW (ref >60.00)
Glucose, Bld: 105 mg/dL — ABNORMAL HIGH (ref 70–99)
POTASSIUM: 4.6 meq/L (ref 3.5–5.1)
SODIUM: 140 meq/L (ref 135–145)
Total Bilirubin: 0.5 mg/dL (ref 0.2–1.2)
Total Protein: 7.7 g/dL (ref 6.0–8.3)

## 2018-05-28 LAB — CBC WITH DIFFERENTIAL/PLATELET
BASOS ABS: 0.1 10*3/uL (ref 0.0–0.1)
Basophils Relative: 1 % (ref 0.0–3.0)
EOS ABS: 0.6 10*3/uL (ref 0.0–0.7)
Eosinophils Relative: 6.8 % — ABNORMAL HIGH (ref 0.0–5.0)
HCT: 45.9 % (ref 39.0–52.0)
Hemoglobin: 15.2 g/dL (ref 13.0–17.0)
LYMPHS ABS: 3 10*3/uL (ref 0.7–4.0)
Lymphocytes Relative: 34.9 % (ref 12.0–46.0)
MCHC: 33 g/dL (ref 30.0–36.0)
MCV: 85.2 fl (ref 78.0–100.0)
MONOS PCT: 8.8 % (ref 3.0–12.0)
Monocytes Absolute: 0.8 10*3/uL (ref 0.1–1.0)
NEUTROS ABS: 4.2 10*3/uL (ref 1.4–7.7)
NEUTROS PCT: 48.5 % (ref 43.0–77.0)
PLATELETS: 293 10*3/uL (ref 150.0–400.0)
RBC: 5.39 Mil/uL (ref 4.22–5.81)
RDW: 15 % (ref 11.5–15.5)
WBC: 8.7 10*3/uL (ref 4.0–10.5)

## 2018-05-28 LAB — TSH: TSH: 2.57 u[IU]/mL (ref 0.35–4.50)

## 2018-05-28 LAB — HEMOGLOBIN A1C: HEMOGLOBIN A1C: 6 % (ref 4.6–6.5)

## 2018-05-28 MED ORDER — POLYETHYLENE GLYCOL 3350 17 G PO PACK: 17.0000 g | PACK | Freq: Two times a day (BID) | ORAL | Status: DC

## 2018-05-28 MED ORDER — POLYETHYLENE GLYCOL 3350 17 GM/SCOOP PO POWD: 17.0000 g | Freq: Two times a day (BID) | ORAL | 3 refills | Status: DC | PRN

## 2018-05-28 MED ORDER — OMEPRAZOLE 20 MG PO CPDR: 20.0000 mg | DELAYED_RELEASE_CAPSULE | Freq: Every day | ORAL | 3 refills | Status: DC

## 2018-05-28 NOTE — Progress Notes (Signed)
Subjective:   Dakota [name] is a 85 y.o. male who presents for Medicare Annual/Subsequent preventive examination.  Review of Systems:  N/A Cardiac Risk Factors include: advanced age (>56men, >85 women);dyslipidemia;hypertension;male gender;smoking/ tobacco exposure     Objective:    Vitals: BP 110/60 (BP Location: Left Arm, Patient Position: Sitting, Cuff Size: Normal)   Pulse 85   Temp 98.2 F (36.8 C) (Oral)   Ht 5' 8.5" (1.74 m) Comment: shoes  Wt 157 lb (71.2 kg)   SpO2 96%   BMI 23.52 kg/m   Body mass index is 23.52 kg/m.  Advanced Directives 05/28/2018 01/29/2018 12/04/2017 12/01/2017 11/21/2017 11/16/2017 01/23/2017  Does Patient Have a Medical Advance Directive? No No No No No No No  Copy of Healthcare Power of Attorney in Chart? - - - - - - -  Would patient like information on creating a medical advance directive? No - Patient declined No - Patient declined No - Patient declined No - Patient declined No - Patient declined No - Patient declined -    Tobacco Social History   Tobacco Use  Smoking Status Former Smoker  . Packs/day: 1.00  . Years: 15.00  . Pack years: 15.00  . Last attempt to quit: 05/02/1953  . Years since quitting: 44.1  Smokeless Tobacco Current User  . Types: Chew  Tobacco Comment   Quit smoking over 50 years ago     Ready to quit: No Counseling given: No Comment: Quit smoking over 50 years ago   Clinical Intake:  Pre-visit preparation completed: Yes  Pain : No/denies pain Pain Score: 0-No pain     Nutritional Status: BMI of 19-24  Normal Nutritional Risks: None Diabetes: No  What is the last grade level you completed in school?: 8th grade  Interpreter Needed?: No  Comments: pt lives with spouse Information entered by :: LPinson, LPN  Past Medical History:  Diagnosis Date  . Anxiety   . Back pain, chronic   . Complete heart block ([place]) 07/18/2014   Medtronic Celeste model [id number] (serial number [id number])  singe lead PPM  .  Esophageal obstruction due to food impaction 2017  . GERD (gastroesophageal reflux disease) 10/2002  . Hyperlipidemia 12/1994  . Hypertension   . Hypokalemia   . Lung collapse 03/1991   Fall  . Permanent atrial fibrillation    Refused coumadin therapy  . Stroke [facility])    Past Surgical History:  Procedure Laterality Date  . BACK SURGERY  11/91   with hardware fixation  . BIOPSY  11/27/2017   Procedure: BIOPSY;  Surgeon: Laurence Spates, MD;  Location: [place];  Service: Endoscopy;;  . CARDIAC CATHETERIZATION  06/2004   30 % stenosis, EF normal  . ESOPHAGOGASTRODUODENOSCOPY N/A 01/06/2016   Procedure: ESOPHAGOGASTRODUODENOSCOPY (EGD);  Surgeon: Otis Brace, MD;  Location: [place];  Service: Gastroenterology;  Laterality: N/A;  . ESOPHAGOGASTRODUODENOSCOPY N/A 11/27/2017   Procedure: ESOPHAGOGASTRODUODENOSCOPY (EGD);  Surgeon: Laurence Spates, MD;  Location: [facility] ENDOSCOPY;  Service: Endoscopy;  Laterality: N/A;  . ESOPHAGOGASTRODUODENOSCOPY (EGD) WITH PROPOFOL N/A 01/29/2018   Procedure: ESOPHAGOGASTRODUODENOSCOPY (EGD) WITH PROPOFOL;  Surgeon: Laurence Spates, MD;  Location: [place];  Service: Endoscopy;  Laterality: N/A;  . EYE SURGERY     Retinal bubble surgery, cataract   . FOREIGN BODY REMOVAL  01/29/2018   Procedure: FOREIGN BODY REMOVAL;  Surgeon: Laurence Spates, MD;  Location: [place];  Service: Endoscopy;;  . IR CT HEAD LTD  11/16/2017  . IR PERCUTANEOUS ART THROMBECTOMY/INFUSION INTRACRANIAL INC DIAG  ANGIO  11/16/2017  . LEFT HEART CATHETERIZATION WITH CORONARY ANGIOGRAM N/A 07/18/2014   Procedure: LEFT HEART CATHETERIZATION WITH CORONARY ANGIOGRAM;  Surgeon: Jettie Booze, MD; [facility] [place], LAD mild dz, D1 80%, D2 OK, CFX system OK, RCA OK, PDA 100%, med rx  . PERMANENT PACEMAKER INSERTION N/A 07/18/2014   Procedure: PERMANENT PACEMAKER INSERTION;  Surgeon: Thompson Grayer, MD; Medtronic [place] model [phone] (serial number [id number])    . RADIOLOGY WITH ANESTHESIA N/A  11/16/2017   Procedure: RADIOLOGY WITH ANESTHESIA;  Surgeon: Luanne Bras, MD;  Location: [place];  Service: Radiology;  Laterality: N/A;  . TEMPORARY PACEMAKER INSERTION N/A 07/18/2014   Procedure: TEMPORARY PACEMAKER INSERTION;  Surgeon: Peter M Martinique, MD;  Location: [facility] CATH LAB;  Service: Cardiovascular;  Laterality: N/A;   Family History  Problem Relation Age of Onset  . Pneumonia Mother   . Hip fracture Mother   . Cancer Father        splenic  . Heart disease Sister        Pacer placed  . Cancer Brother        throat and lung  . Cancer Brother        prostate, age 72  . Prostate cancer Brother   . Heart disease Son   . Stroke Neg Hx   . Colon cancer Neg Hx    Social History   Socioeconomic History  . Marital status: Married    Spouse name: Not on file  . Number of children: 4  . Years of education: Not on file  . Highest education level: Not on file  Occupational History  . Occupation: retired    Comment: Barista  . Financial resource strain: Not on file  . Food insecurity:    Worry: Not on file    Inability: Not on file  . Transportation needs:    Medical: Not on file    Non-medical: Not on file  Tobacco Use  . Smoking status: Former Smoker    Packs/day: 1.00    Years: 15.00    Pack years: 15.00    Last attempt to quit: 05/02/1953    Years since quitting: 65.1  . Smokeless tobacco: Current User    Types: Chew  . Tobacco comment: Quit smoking over 50 years ago  Substance and Sexual Activity  . Alcohol use: No  . Drug use: No  . Sexual activity: Never  Lifestyle  . Physical activity:    Days per week: Not on file    Minutes per session: Not on file  . Stress: Not on file  Relationships  . Social connections:    Talks on phone: Not on file    Gets together: Not on file    Attends religious service: Not on file    Active member of club or organization: Not on file    Attends meetings of clubs or organizations: Not on file     Relationship status: Not on file  Other Topics Concern  . Not on file  Social History Narrative   Retired from [employer], building/construction   Married 1953   4 kids   Caring for his wife at home- source of anxiety for patient    Outpatient Encounter Medications as of 05/28/2018  Medication Sig  . aspirin EC 325 MG tablet Take 1 tablet (325 mg total) by mouth daily.  [name] DOXYLAMINE-DM PO Take by mouth at bedtime.  . Magnesium Hydroxide (MILK OF MAGNESIA PO) Take by mouth as  needed.  . Melatonin 3 MG TABS Take 1.5 mg by mouth at bedtime. Take 1 tablet (3 mg total) by mouth at bedtime as needed.   . mirtazapine (REMERON) 7.5 MG tablet Take 1 tablet (7.5 mg total) by mouth at bedtime.  [name] omeprazole (PRILOSEC) 20 MG capsule Take 20 mg by mouth daily.  . polyethylene glycol (MIRALAX / GLYCOLAX) packet Take 17 g by mouth daily.   No facility-administered encounter medications on file as of 05/28/2018.     Activities of Daily Living In your present state of health, do you have any difficulty performing the following activities: 05/28/2018 12/05/2017  Hearing? Tempie Donning  Vision? N N  Difficulty concentrating or making decisions? Tempie Donning  Walking or climbing stairs? N Y  Dressing or bathing? Y Y  Doing errands, shopping? [place] and eating ? Y -  Using the Toilet? N -  In the past six months, have you accidently leaked urine? Y -  Do you have problems with loss of bowel control? N -  Managing your Medications? Y -  Managing your Finances? Y -  Housekeeping or managing your Housekeeping? Y -  Some recent data might be hidden    Patient Care Team: Tonia Ghent, MD as PCP - General (Family Medicine) Josue Hector, MD as Consulting Physician (Cardiology) Thompson Grayer, MD as Consulting Physician (Cardiology) Patsey Berthold, NP as Nurse Practitioner (Cardiology) Ardis Hughs, MD as Attending Physician (Urology)   Assessment:   This is a routine wellness  examination for Chayse.   Hearing Screening   125Hz  250Hz  500Hz  1000Hz  2000Hz  3000Hz  4000Hz  6000Hz  8000Hz   Right ear:   0 0 0  0    Left ear:   0 0 0  0    Vision Screening Comments: Vision exam in Nov 2019 with Dr. Maryruth Hancock B.    Exercise Activities and Dietary recommendations Current Exercise Habits: Home exercise routine, Type of exercise: walking, Time (Minutes): 20, Frequency (Times/Week): 7, Weekly Exercise (Minutes/Week): 140, Intensity: Mild, Exercise limited by: None identified  Goals    . Patient Stated     Starting 05/28/2018, I will continue to take medications as prescribed.        Fall Risk Fall Risk  05/28/2018 02/09/2018 04/06/2017 01/23/2017 10/05/2015  Falls in the past year? 0 Yes Yes No Yes  Comment - - Emmi Telephone Survey: data to providers prior to load - -  Number falls in past yr: - 1 1 - 1  Comment - - Emmi Telephone Survey Actual Response = 1 - -  Injury with Fall? - No No - -  Risk for fall due to : - - - - -  Follow up - - - - -   Depression Screen PHQ 2/9 Scores 05/28/2018 01/23/2017 10/05/2015 09/23/2015  PHQ - 2 Score 3 0 2 0  PHQ- 9 Score 15 - - -    Cognitive Function MMSE - Mini Mental State Exam 05/17/2018 01/23/2017 09/23/2015  Orientation to time 3 5 5   Orientation to Place 4 5 5   Registration 3 3 3   Attention/ Calculation 0 0 0  Recall 2 2 0  Recall-comments - unable to recall 1 of 3 words unable to recall words  Language- name 2 objects 2 0 0  Language- repeat 0 1 1  Language- follow 3 step command 3 2 3   Language- follow 3 step command-comments - unable to follow 1 step of  3 step command multiple ques were given  Language- read & follow direction 0 0 0  Write a sentence 0 0 0  Copy design 0 0 0  Total score 17 18 17      PLEASE NOTE: A Mini-Cog screen was NOT  Completed. Patient recently had cognitive test with neurology.   Immunization History  Administered Date(s) Administered  . Td 05/02/1997, 11/26/2007  . Tdap 10/04/2017     Screening Tests Health Maintenance  Topic Date Due  . INFLUENZA VACCINE  07/31/2018 (Originally 11/30/2017)  . PNA vac Low Risk Adult (1 of 2 - PCV13) 09/23/2023 (Originally 01/06/1996)  . TETANUS/TDAP  10/05/2027     Plan:     I have personally reviewed, addressed, and noted the following in the patient's chart:  A. Medical and social history B. Use of alcohol, tobacco or illicit drugs  C. Current medications and supplements D. Functional ability and status E.  Nutritional status F.  Physical activity G. Advance directives H. List of other physicians I.  Hospitalizations, surgeries, and ER visits in previous 12 months J.  [place] to include hearing, vision, cognitive, depression L. Referrals and appointments - none  In addition, I have reviewed and discussed with patient certain preventive protocols, quality metrics, and best practice recommendations. A written personalized care plan for preventive services as well as general preventive health recommendations were provided to patient.  See attached scanned questionnaire for additional information.   Signed,   Lindell Noe, MHA, BS, LPN Health Coach

## 2018-05-28 NOTE — Progress Notes (Signed)
PCP notes:   Health maintenance:  Flu vaccine - pt declined  Abnormal screenings:   Hearing - failed  Hearing Screening   125Hz  250Hz  500Hz  1000Hz  2000Hz  3000Hz  4000Hz  6000Hz  8000Hz   Right ear:   0 0 0  0    Left ear:   0 0 0  0     Depression score: 15 Depression screen [facility] 2/9 05/28/2018 01/23/2017 10/05/2015 09/23/2015 10/26/2012  Decreased Interest 0 0 1 0 0  Down, Depressed, Hopeless 3 0 1 0 0  PHQ - 2 Score 3 0 2 0 0  Altered sleeping 2 - - - -  Tired, decreased energy 2 - - - -  Change in appetite 0 - - - -  Feeling bad or failure about yourself  0 - - - -  Trouble concentrating 2 - - - -  Moving slowly or fidgety/restless 3 - - - -  Suicidal thoughts 3 - - - -  PHQ-9 Score 15 - - - -  Difficult doing work/chores Somewhat difficult - - - -  Some recent data might be hidden    Patient concerns:   Patient verbalized negative dynamics about his relationship with his daughter. PCP notified.  Per son and daughter, patient has a rash on back that began approx. 4 days ago.  Per son and daughter, patient's emotional health has declined since stroke in July.   Nurse concerns:  None  Next PCP appt:   05/28/18 @ 1215  I reviewed health advisor's note, was available for consultation on the day of service listed in this note, and agree with documentation and plan. [name], MD.

## 2018-05-28 NOTE — Progress Notes (Signed)
Rash.  On his back.  For about 1 week.  Itchy, not painful.    Post CVA mood and memory changes.  Speech is still affected at baseline.  No bleeding.  H/o GIB noted.  Mood changes noted along with memory changes. Remeron helped with sleep, but he can still get irritable and have agitation.   His family is working hard to keep him at home and keep him in a safe environment.  They monitor him to keep him from wandering and getting lost. He has people sitting with him 24/7.   Fall cautions d/w pt and family.  Statin intolerant.  Defer lipids for now, given the checking his lipids would not change management.  He has h/o dysphagia and family is grinding food to help.  He has new dentures but he hasn't tolerated them.  Family got him new glasses, he uses them for reading when prompted.    BP usually controlled except when agitated.  Usually similar to check today at [place]. He is off BP meds.    He has been taking Miralax and MOM and senokot but unfortunately they didn't help much.   He still has some constipation.  PMH and SH reviewed  ROS: Per HPI unless specifically indicated in ROS section   Meds, vitals, and allergies reviewed.   GEN: nad, alert.  Chronic speech changes noted at baseline. HEENT: mucous membranes moist NECK: supple w/o LA CV: rrr with occasional ectopy noted. PULM: ctab, no inc wob ABD: soft, +bs EXT: no edema SKIN: L>R lower back blanching rash.  No ulceration.  Does not appear infected.

## 2018-05-28 NOTE — Patient Instructions (Signed)
[name] , Thank you for taking time to come for your Medicare Wellness Visit. I appreciate your ongoing commitment to your health goals. Please review the following plan we discussed and let me know if I can assist you in the future.   These are the goals we discussed: Goals    . Patient Stated     Starting 05/28/2018, I will continue to take medications as prescribed.        This is a list of the screening recommended for you and due dates:  Health Maintenance  Topic Date Due  . Flu Shot  07/31/2018*  . Pneumonia vaccines (1 of 2 - PCV13) 09/23/2023*  . Tetanus Vaccine  10/05/2027  *Topic was postponed. The date shown is not the original due date.   Preventive Care for Adults  A healthy lifestyle and preventive care can promote health and wellness. Preventive health guidelines for adults include the following key practices.  . A routine yearly physical is a good way to check with your health care provider about your health and preventive screening. It is a chance to share any concerns and updates on your health and to receive a thorough exam.  . Visit your dentist for a routine exam and preventive care every 6 months. Brush your teeth twice a day and floss once a day. Good oral hygiene prevents tooth decay and gum disease.  . The frequency of eye exams is based on your age, health, family medical history, use  of contact lenses, and other factors. Follow your health care provider's recommendations for frequency of eye exams.  . Eat a healthy diet. Foods like vegetables, fruits, whole grains, low-fat dairy products, and lean protein foods contain the nutrients you need without too many calories. Decrease your intake of foods high in solid fats, added sugars, and salt. Eat the right amount of calories for you. Get information about a proper diet from your health care provider, if necessary.  . Regular physical exercise is one of the most important things you can do for your health.  Most adults should get at least 150 minutes of moderate-intensity exercise (any activity that increases your heart rate and causes you to sweat) each week. In addition, most adults need muscle-strengthening exercises on 2 or more days a week.  Silver Sneakers may be a benefit available to you. To determine eligibility, you may visit the website: www.silversneakers.com or contact program at 980-721-5352 Mon-Fri between 8AM-8PM.   . Maintain a healthy weight. The body mass index (BMI) is a screening tool to identify possible weight problems. It provides an estimate of body fat based on height and weight. Your health care provider can find your BMI and can help you achieve or maintain a healthy weight.   For adults 20 years and older: ? A BMI below 18.5 is considered underweight. ? A BMI of 18.5 to 24.9 is normal. ? A BMI of 25 to 29.9 is considered overweight. ? A BMI of 30 and above is considered obese.   . Maintain normal blood lipids and cholesterol levels by exercising and minimizing your intake of saturated fat. Eat a balanced diet with plenty of fruit and vegetables. Blood tests for lipids and cholesterol should begin at age 51 and be repeated every 5 years. If your lipid or cholesterol levels are high, you are over 50, or you are at high risk for heart disease, you may need your cholesterol levels checked more frequently. Ongoing high lipid and cholesterol levels should  be treated with medicines if diet and exercise are not working.  . If you smoke, find out from your health care provider how to quit. If you do not use tobacco, please do not start.  . If you choose to drink alcohol, please do not consume more than 2 drinks per day. One drink is considered to be 12 ounces (355 mL) of beer, 5 ounces (148 mL) of wine, or 1.5 ounces (44 mL) of liquor.  . If you are 19-20 years old, ask your health care provider if you should take aspirin to prevent strokes.  . Use sunscreen. Apply sunscreen  liberally and repeatedly throughout the day. You should seek shade when your shadow is shorter than you. Protect yourself by wearing long sleeves, pants, a wide-brimmed hat, and sunglasses year round, whenever you are outdoors.  . Once a month, do a whole body skin exam, using a mirror to look at the skin on your back. Tell your health care provider of new moles, moles that have irregular borders, moles that are larger than a pencil eraser, or moles that have changed in shape or color.

## 2018-05-28 NOTE — Patient Instructions (Signed)
Use OTC hydrocortisone on the rash.  Update me as needed.  I'll await the response from neurology.   I sent the rx for miralax (increase to twice a day) and prilosec.   We'll contact you with your lab report.  Take care.  Glad to see you.

## 2018-05-30 DIAGNOSIS — R21 Rash and other nonspecific skin eruption: Secondary | ICD-10-CM | POA: Insufficient documentation

## 2018-05-30 NOTE — Assessment & Plan Note (Signed)
Reasonable to increase MiraLAX to twice a day and update me as needed.

## 2018-05-30 NOTE — Assessment & Plan Note (Signed)
It still looks like most of his issues stem from his previous stroke.  He was not able to tolerate anticoagulation after he developed a subsequent GI bleed.  He has mood and memory changes.  He can get lost if he is not supervised.  Family is working hard to keep him in a safe environment at home.  They are trying Remeron to see if that will help with his sleeping and also if it would help with his mood.  They are awaiting neurology input on increasing the dose.  I will defer for now.  Family declined psych input at this point but he may need that in the future.  Discussed.  I will await family input.  >25 minutes spent in face to face time with patient, >50% spent in counselling or coordination of care.

## 2018-05-30 NOTE — Assessment & Plan Note (Signed)
Statin intolerant and I will defer checking his lipids as it would not change management at that point given his other issues.

## 2018-05-30 NOTE — Assessment & Plan Note (Signed)
Benign-appearing.  I would try using over-the-counter hydrocortisone locally and update me as needed.  They agree.

## 2018-05-30 NOTE — Assessment & Plan Note (Signed)
Family is grounding food to make it easier to swallow and that is reasonable.  They will update me as needed.

## 2018-05-31 ENCOUNTER — Other Ambulatory Visit: Payer: Self-pay | Admitting: Adult Health

## 2018-05-31 MED ORDER — MIRTAZAPINE 15 MG PO TABS: 15.0000 mg | ORAL_TABLET | Freq: Every day | ORAL | 2 refills | Status: DC

## 2018-07-22 ENCOUNTER — Encounter: Payer: Self-pay | Admitting: Family Medicine

## 2018-07-22 ENCOUNTER — Encounter: Payer: Self-pay | Admitting: Adult Health

## 2018-07-23 ENCOUNTER — Other Ambulatory Visit: Payer: Self-pay | Admitting: Family Medicine

## 2018-07-23 DIAGNOSIS — R300 Dysuria: Secondary | ICD-10-CM

## 2018-07-23 MED ORDER — SULFAMETHOXAZOLE-TRIMETHOPRIM 400-80 MG PO TABS: 1.0000 | ORAL_TABLET | Freq: Two times a day (BID) | ORAL | 0 refills | Status: DC

## 2018-07-24 ENCOUNTER — Other Ambulatory Visit: Payer: Self-pay | Admitting: Adult Health

## 2018-07-24 ENCOUNTER — Other Ambulatory Visit: Payer: Self-pay

## 2018-07-24 ENCOUNTER — Other Ambulatory Visit: Payer: Medicare Other

## 2018-07-24 DIAGNOSIS — R300 Dysuria: Secondary | ICD-10-CM | POA: Diagnosis not present

## 2018-07-24 MED ORDER — CITALOPRAM HYDROBROMIDE 10 MG PO TABS: 10.0000 mg | ORAL_TABLET | Freq: Every day | ORAL | 0 refills | Status: DC

## 2018-07-24 MED ORDER — MIRTAZAPINE 7.5 MG PO TABS: 7.5000 mg | ORAL_TABLET | Freq: Every day | ORAL | 0 refills | Status: DC

## 2018-07-24 NOTE — Progress Notes (Signed)
Due to ongoing behaviors related to dementia, initiate Celexa 10 mg daily and will wean him off from mirtazapine as he has not benefited from this medication.  Hesitant on starting any other type of medication at this time due to increased risk of nocturnal falls, dizziness or drowsiness.  Will likely need an order to geriatric psychiatry for further management of his ongoing behaviors

## 2018-07-26 LAB — URINE CULTURE
MICRO NUMBER: 349183
SPECIMEN QUALITY: ADEQUATE

## 2018-08-07 ENCOUNTER — Encounter: Payer: Medicare Other | Admitting: *Deleted

## 2018-08-07 ENCOUNTER — Telehealth: Payer: Self-pay | Admitting: Cardiology

## 2018-08-07 ENCOUNTER — Other Ambulatory Visit: Payer: Self-pay

## 2018-08-07 NOTE — Telephone Encounter (Signed)
Patient daughter called and stated pt home monitor is not working. They called tech support and a new monitor has been ordered. Patient will send transmission when they receive the new monitor.

## 2018-08-08 ENCOUNTER — Telehealth: Payer: Self-pay

## 2018-08-08 ENCOUNTER — Telehealth: Payer: Self-pay | Admitting: Internal Medicine

## 2018-08-08 NOTE — Telephone Encounter (Signed)
New Message    Pts daughter is calling because they were having problems with the machine and they called the number on the box and they tried troubleshooting it.  They are sending them a new machine   If you have any questions, please call Pts Daughter, [name]

## 2018-08-08 NOTE — Telephone Encounter (Signed)
Noted  

## 2018-08-08 NOTE — Telephone Encounter (Signed)
Left message for patient to remind of missed remote transmission.  

## 2018-08-13 ENCOUNTER — Ambulatory Visit (INDEPENDENT_AMBULATORY_CARE_PROVIDER_SITE_OTHER): Payer: Medicare Other | Admitting: *Deleted

## 2018-08-13 ENCOUNTER — Other Ambulatory Visit: Payer: Self-pay

## 2018-08-13 ENCOUNTER — Encounter: Payer: Self-pay | Admitting: Adult Health

## 2018-08-13 DIAGNOSIS — I442 Atrioventricular block, complete: Secondary | ICD-10-CM

## 2018-08-13 LAB — CUP PACEART REMOTE DEVICE CHECK
Battery Impedance: 1083 Ohm
Battery Remaining Longevity: 63 mo
Battery Voltage: 2.75 V
Brady Statistic RV Percent Paced: 14 %
Date Time Interrogation Session: 20200413140952
Implantable Lead Implant Date: 20160318
Implantable Lead Location: 753860
Implantable Lead Model: 5076
Implantable Pulse Generator Implant Date: 20160318
Lead Channel Impedance Value: 0 Ohm
Lead Channel Impedance Value: 455 Ohm
Lead Channel Pacing Threshold Amplitude: 0.875 V
Lead Channel Pacing Threshold Pulse Width: 0.4 ms
Lead Channel Setting Pacing Amplitude: 2.5 V
Lead Channel Setting Pacing Pulse Width: 0.4 ms
Lead Channel Setting Sensing Sensitivity: 2 mV

## 2018-08-15 ENCOUNTER — Other Ambulatory Visit: Payer: Self-pay | Admitting: Adult Health

## 2018-08-15 MED ORDER — CITALOPRAM HYDROBROMIDE 10 MG PO TABS: 10.0000 mg | ORAL_TABLET | Freq: Every day | ORAL | 2 refills | Status: DC

## 2018-08-23 ENCOUNTER — Encounter: Payer: Self-pay | Admitting: Cardiology

## 2018-08-23 NOTE — Progress Notes (Signed)
Remote pacemaker transmission.   

## 2018-08-28 ENCOUNTER — Telehealth: Payer: Self-pay | Admitting: Adult Health

## 2018-08-28 NOTE — Telephone Encounter (Signed)
PTs daughter cancel appt with JEssica per phone room stating appt was not needed. All medications such as celexa, and remeron will have to be manage by pts primary doctor. Message will be sent to [facility] NP.

## 2018-08-28 NOTE — Telephone Encounter (Signed)
Noted! Thank you

## 2018-08-28 NOTE — Telephone Encounter (Signed)
pts daughter called in to cancel appt on 09/03/2018 , offered VV and Televisit, she stated it wasn't need due to pt doing well and not having any problems

## 2018-08-30 NOTE — Telephone Encounter (Signed)
I responded to pts mychart per daughter [name] I spoke with her and schedule appt in August 2020. I stated if we are 100 percent open pt will check in at 1215pm.I also stated Dr Damita Dunnings is her PCP and can manage celexa and remeron. The daughter stated her father is doing well but has some problems at times with sleep at times. I stated she will be given a call week prior if video is the preferred. She verbalized understanding.

## 2018-09-03 ENCOUNTER — Ambulatory Visit: Payer: Medicare Other | Admitting: Adult Health

## 2018-09-14 ENCOUNTER — Telehealth: Payer: Self-pay | Admitting: Family Medicine

## 2018-09-14 ENCOUNTER — Encounter: Payer: Self-pay | Admitting: Family Medicine

## 2018-09-14 NOTE — Telephone Encounter (Signed)
Patient's daughter called to let Dr.Duncan know that patient's wife, Dakota [name], passed away last week.

## 2018-09-14 NOTE — Telephone Encounter (Signed)
Called daughter, LMOVM.  Called patient.  He is hard of hearing but we had a brief conversation.  I offered condolences.  He thanked me for the call.   Please check with daughter on Monday, please give her my condolences, and let me know if there is anything I can do for her or for her father.   Thanks.

## 2018-09-17 NOTE — Telephone Encounter (Signed)
Left message for daughter again.

## 2018-10-09 ENCOUNTER — Ambulatory Visit (INDEPENDENT_AMBULATORY_CARE_PROVIDER_SITE_OTHER)
Admission: RE | Admit: 2018-10-09 | Discharge: 2018-10-09 | Disposition: A | Discharge status: Home / Self Care | Payer: Medicare Other | Source: Ambulatory Visit | Attending: Family Medicine | Admitting: Family Medicine

## 2018-10-09 ENCOUNTER — Other Ambulatory Visit: Payer: Self-pay

## 2018-10-09 ENCOUNTER — Ambulatory Visit (INDEPENDENT_AMBULATORY_CARE_PROVIDER_SITE_OTHER): Payer: Medicare Other | Admitting: Family Medicine

## 2018-10-09 ENCOUNTER — Encounter: Payer: Self-pay | Admitting: Family Medicine

## 2018-10-09 VITALS — BP 102/60 | HR 80 | Temp 98.3°F | Resp 20 | Ht 68.5 in | Wt 156.3 lb

## 2018-10-09 DIAGNOSIS — K5900 Constipation, unspecified: Secondary | ICD-10-CM | POA: Diagnosis not present

## 2018-10-09 DIAGNOSIS — M545 Low back pain, unspecified: Secondary | ICD-10-CM

## 2018-10-09 DIAGNOSIS — Z9181 History of falling: Secondary | ICD-10-CM | POA: Diagnosis not present

## 2018-10-09 DIAGNOSIS — I63311 Cerebral infarction due to thrombosis of right middle cerebral artery: Secondary | ICD-10-CM

## 2018-10-09 DIAGNOSIS — R300 Dysuria: Secondary | ICD-10-CM | POA: Diagnosis not present

## 2018-10-09 LAB — POCT URINALYSIS DIPSTICK
Bilirubin, UA: NEGATIVE
Blood, UA: POSITIVE
Glucose, UA: NEGATIVE
Ketones, UA: NEGATIVE
Leukocytes, UA: NEGATIVE
Nitrite, UA: NEGATIVE
Protein, UA: NEGATIVE
Spec Grav, UA: 1.02 (ref 1.010–1.025)
Urobilinogen, UA: 1 E.U./dL
pH, UA: 5 (ref 5.0–8.0)

## 2018-10-09 MED ORDER — ACETAMINOPHEN 325 MG PO TABS: 325.0000 mg | ORAL_TABLET | Freq: Two times a day (BID) | ORAL | Status: AC | PRN

## 2018-10-09 MED ORDER — ASPIRIN EC 81 MG PO TBEC: 81.0000 mg | DELAYED_RELEASE_TABLET | Freq: Every day | ORAL | Status: DC

## 2018-10-09 NOTE — Progress Notes (Signed)
Here with daughter today.  Multiple issues to consider.  He has gait changes at baseline.  He is using a walker at baseline, if not using a cane.   He has trouble hearing well enough to use a cell phone.  He is trying to get out and walk with supervision.  Given his gait changes is not reasonable for him to drive a car by himself.  Discussed.  H/o falls noted.    Sleep changes.  Likely affected by LUTS.  Urinary frequency noted.  More at night, q2h nocturia at night, at least.  He also has back pain which could be contributing.  No new radicular symptoms  He has had some stomach upset along with occasional constipation.  Treatment with MOM seem to help more than mirlax.  MiraLAX was ineffective.  He thought he may be having some abdominal discomfort from taking aspirin 325 mg a day.  He still on PPI.  His wife recently passed away.  Condolences offered.  Family is supportive.  PMH and SH reviewed  ROS: Per HPI unless specifically indicated in ROS section   Meds, vitals, and allergies reviewed.   nad Elderly male, hard of hearing ncat Neck supple, no LA Sounds to be rrr ctab abd soft, LLQ minimally ttp.  Normal bowel sounds.  No rebound. Back slightly tender outpatient in the lower midline.  No rash. Ext w/o edema.

## 2018-10-09 NOTE — Patient Instructions (Signed)
Change aspirin to 81mg  a day.  Take tylenol 325mg  1-2 tab twice a day for back pain.   Go to the lab on the way out.  We'll contact you with your lab and xray report.  Consider flomax use after I see xrays and labs, with the plan to change 1 med at a time.   Let me know how you are sleeping after these changes.  Take care.  Glad to see you.

## 2018-10-11 ENCOUNTER — Encounter: Payer: Self-pay | Admitting: Family Medicine

## 2018-10-11 DIAGNOSIS — M545 Low back pain, unspecified: Secondary | ICD-10-CM | POA: Insufficient documentation

## 2018-10-11 DIAGNOSIS — R300 Dysuria: Secondary | ICD-10-CM | POA: Insufficient documentation

## 2018-10-11 DIAGNOSIS — Z9181 History of falling: Secondary | ICD-10-CM | POA: Insufficient documentation

## 2018-10-11 NOTE — Assessment & Plan Note (Signed)
Advised not to drive.  Given his abdominal pain and his fall risk is reasonable to cut back from 325 mg aspirin down to 81 mg.

## 2018-10-11 NOTE — Assessment & Plan Note (Signed)
Seems to be more effectively treated with MOM instead of MiraLAX.  Continue as needed use.

## 2018-10-11 NOTE — Assessment & Plan Note (Addendum)
Likely contributing to sleep changes which could be affecting his function during the day Consider flomax use after I see xrays and labs, with the plan to change 1 med at a time.  Rationale discussed.  Agreed. >25 minutes spent in face to face time with patient, >50% spent in counselling or coordination of care

## 2018-10-11 NOTE — Assessment & Plan Note (Signed)
Check plain films of the lower back.  See notes on imaging.

## 2018-10-12 ENCOUNTER — Other Ambulatory Visit: Payer: Self-pay | Admitting: Family Medicine

## 2018-10-12 LAB — URINE CULTURE
MICRO NUMBER:: 551342
SPECIMEN QUALITY:: ADEQUATE

## 2018-10-12 MED ORDER — NITROFURANTOIN MONOHYD MACRO 100 MG PO CAPS: 100.0000 mg | ORAL_CAPSULE | Freq: Two times a day (BID) | ORAL | 0 refills | Status: DC

## 2018-10-19 ENCOUNTER — Encounter: Payer: Self-pay | Admitting: Family Medicine

## 2018-10-22 ENCOUNTER — Other Ambulatory Visit: Payer: Self-pay | Admitting: Family Medicine

## 2018-10-22 MED ORDER — TAMSULOSIN HCL 0.4 MG PO CAPS: 0.4000 mg | ORAL_CAPSULE | Freq: Every day | ORAL | 3 refills | Status: DC

## 2018-11-12 ENCOUNTER — Ambulatory Visit (INDEPENDENT_AMBULATORY_CARE_PROVIDER_SITE_OTHER): Payer: Medicare Other | Admitting: *Deleted

## 2018-11-12 DIAGNOSIS — I4891 Unspecified atrial fibrillation: Secondary | ICD-10-CM

## 2018-11-12 DIAGNOSIS — I442 Atrioventricular block, complete: Secondary | ICD-10-CM | POA: Diagnosis not present

## 2018-11-12 LAB — CUP PACEART REMOTE DEVICE CHECK
Battery Impedance: 1134 Ohm
Battery Remaining Longevity: 61 mo
Battery Voltage: 2.75 V
Brady Statistic RV Percent Paced: 18 %
Date Time Interrogation Session: 20200713142319
Implantable Lead Implant Date: 20160318
Implantable Lead Location: 753860
Implantable Lead Model: 5076
Implantable Pulse Generator Implant Date: 20160318
Lead Channel Impedance Value: 0 Ohm
Lead Channel Impedance Value: 484 Ohm
Lead Channel Pacing Threshold Amplitude: 1 V
Lead Channel Pacing Threshold Pulse Width: 0.4 ms
Lead Channel Setting Pacing Amplitude: 2.5 V
Lead Channel Setting Pacing Pulse Width: 0.4 ms
Lead Channel Setting Sensing Sensitivity: 2 mV

## 2018-11-15 ENCOUNTER — Encounter: Payer: Self-pay | Admitting: Family Medicine

## 2018-11-16 ENCOUNTER — Telehealth: Payer: Self-pay

## 2018-11-16 DIAGNOSIS — Z9189 Other specified personal risk factors, not elsewhere classified: Secondary | ICD-10-CM

## 2018-11-16 NOTE — Telephone Encounter (Signed)
Daughter advised and given testing site information.

## 2018-11-16 NOTE — Telephone Encounter (Signed)
[facility] Night - Client Nonclinical Telephone Record AccessNurse Client [facility] Night - Client Client Site [place] - Night Contact Type Call Who Is Calling Patient / Member / Family / Caregiver Caller Name Afton Phone Number [phone] Patient Name Dakota [name] Patient DOB 1928/08/24 Call Type Message Only Information Provided Reason for Call Request for General Office Information Initial Comment Callers dad might off been exposed to Covid Additional Comment Call Closed By: Ander Gaster Transaction Date/Time: 11/15/2018 6:53:31 PM (ET)

## 2018-11-16 NOTE — Telephone Encounter (Signed)
I would get the testing done.  I put in the order.  Please direct pt to a testing site.  Thanks.

## 2018-11-16 NOTE — Telephone Encounter (Signed)
Pt's aid father in law had his granddaughter over the weekend and she went home with aid. The father-in-law ended up testing positive on Tuesday. The pt's aid told them the granddaughter was being quarantined for 14 days. The aid has not been tested but was told she needed to be tested. The pt's daughter wants to know if they should have dad tested?   Please call [phone].

## 2018-11-19 ENCOUNTER — Other Ambulatory Visit: Payer: Self-pay | Admitting: Family Medicine

## 2018-11-19 DIAGNOSIS — Z20822 Contact with and (suspected) exposure to covid-19: Secondary | ICD-10-CM

## 2018-11-19 NOTE — Progress Notes (Signed)
no

## 2018-11-22 LAB — NOVEL CORONAVIRUS, NAA: SARS-CoV-2, NAA: NOT DETECTED

## 2018-11-25 ENCOUNTER — Ambulatory Visit (INDEPENDENT_AMBULATORY_CARE_PROVIDER_SITE_OTHER): Payer: Medicare Other

## 2018-11-25 ENCOUNTER — Emergency Department (HOSPITAL_COMMUNITY): Payer: Medicare Other

## 2018-11-25 ENCOUNTER — Ambulatory Visit (INDEPENDENT_AMBULATORY_CARE_PROVIDER_SITE_OTHER)
Admission: EM | Admit: 2018-11-25 | Discharge: 2018-11-25 | Disposition: A | Discharge status: Home / Self Care | Payer: Medicare Other | Source: Home / Self Care

## 2018-11-25 ENCOUNTER — Other Ambulatory Visit: Payer: Self-pay

## 2018-11-25 ENCOUNTER — Encounter (HOSPITAL_COMMUNITY): Payer: Self-pay | Admitting: Emergency Medicine

## 2018-11-25 ENCOUNTER — Telehealth: Payer: Self-pay | Admitting: Family Medicine

## 2018-11-25 ENCOUNTER — Inpatient Hospital Stay (HOSPITAL_COMMUNITY)
Admission: EM | Admit: 2018-11-25 | Discharge: 2018-11-29 | DRG: 470 | Disposition: A | Discharge status: Home Health | Payer: Medicare Other | Attending: Internal Medicine | Admitting: Internal Medicine

## 2018-11-25 ENCOUNTER — Encounter (HOSPITAL_COMMUNITY): Payer: Self-pay | Admitting: *Deleted

## 2018-11-25 DIAGNOSIS — W19XXXA Unspecified fall, initial encounter: Secondary | ICD-10-CM | POA: Diagnosis not present

## 2018-11-25 DIAGNOSIS — F17220 Nicotine dependence, chewing tobacco, uncomplicated: Secondary | ICD-10-CM | POA: Diagnosis present

## 2018-11-25 DIAGNOSIS — S72011A Unspecified intracapsular fracture of right femur, initial encounter for closed fracture: Secondary | ICD-10-CM | POA: Diagnosis present

## 2018-11-25 DIAGNOSIS — K219 Gastro-esophageal reflux disease without esophagitis: Secondary | ICD-10-CM | POA: Diagnosis present

## 2018-11-25 DIAGNOSIS — Z950 Presence of cardiac pacemaker: Secondary | ICD-10-CM | POA: Diagnosis not present

## 2018-11-25 DIAGNOSIS — S72001A Fracture of unspecified part of neck of right femur, initial encounter for closed fracture: Secondary | ICD-10-CM

## 2018-11-25 DIAGNOSIS — Z66 Do not resuscitate: Secondary | ICD-10-CM | POA: Diagnosis present

## 2018-11-25 DIAGNOSIS — Y92019 Unspecified place in single-family (private) house as the place of occurrence of the external cause: Secondary | ICD-10-CM | POA: Diagnosis not present

## 2018-11-25 DIAGNOSIS — M25559 Pain in unspecified hip: Secondary | ICD-10-CM | POA: Diagnosis not present

## 2018-11-25 DIAGNOSIS — I4821 Permanent atrial fibrillation: Secondary | ICD-10-CM | POA: Diagnosis present

## 2018-11-25 DIAGNOSIS — Z8673 Personal history of transient ischemic attack (TIA), and cerebral infarction without residual deficits: Secondary | ICD-10-CM

## 2018-11-25 DIAGNOSIS — D72829 Elevated white blood cell count, unspecified: Secondary | ICD-10-CM | POA: Diagnosis present

## 2018-11-25 DIAGNOSIS — Z03818 Encounter for observation for suspected exposure to other biological agents ruled out: Secondary | ICD-10-CM | POA: Diagnosis not present

## 2018-11-25 DIAGNOSIS — S72041A Displaced fracture of base of neck of right femur, initial encounter for closed fracture: Secondary | ICD-10-CM | POA: Diagnosis not present

## 2018-11-25 DIAGNOSIS — E785 Hyperlipidemia, unspecified: Secondary | ICD-10-CM | POA: Diagnosis present

## 2018-11-25 DIAGNOSIS — S72009A Fracture of unspecified part of neck of unspecified femur, initial encounter for closed fracture: Secondary | ICD-10-CM | POA: Diagnosis present

## 2018-11-25 DIAGNOSIS — Z419 Encounter for procedure for purposes other than remedying health state, unspecified: Secondary | ICD-10-CM

## 2018-11-25 DIAGNOSIS — F05 Delirium due to known physiological condition: Secondary | ICD-10-CM | POA: Diagnosis not present

## 2018-11-25 DIAGNOSIS — D649 Anemia, unspecified: Secondary | ICD-10-CM | POA: Diagnosis not present

## 2018-11-25 DIAGNOSIS — F0390 Unspecified dementia without behavioral disturbance: Secondary | ICD-10-CM | POA: Diagnosis not present

## 2018-11-25 DIAGNOSIS — Z79899 Other long term (current) drug therapy: Secondary | ICD-10-CM | POA: Diagnosis not present

## 2018-11-25 DIAGNOSIS — W010XXA Fall on same level from slipping, tripping and stumbling without subsequent striking against object, initial encounter: Secondary | ICD-10-CM | POA: Diagnosis present

## 2018-11-25 DIAGNOSIS — Z96641 Presence of right artificial hip joint: Secondary | ICD-10-CM | POA: Diagnosis not present

## 2018-11-25 DIAGNOSIS — E871 Hypo-osmolality and hyponatremia: Secondary | ICD-10-CM | POA: Diagnosis not present

## 2018-11-25 DIAGNOSIS — M25551 Pain in right hip: Secondary | ICD-10-CM | POA: Diagnosis not present

## 2018-11-25 DIAGNOSIS — I4891 Unspecified atrial fibrillation: Secondary | ICD-10-CM | POA: Diagnosis not present

## 2018-11-25 DIAGNOSIS — F0151 Vascular dementia with behavioral disturbance: Secondary | ICD-10-CM | POA: Diagnosis not present

## 2018-11-25 DIAGNOSIS — R8271 Bacteriuria: Secondary | ICD-10-CM | POA: Diagnosis not present

## 2018-11-25 DIAGNOSIS — Z471 Aftercare following joint replacement surgery: Secondary | ICD-10-CM | POA: Diagnosis not present

## 2018-11-25 DIAGNOSIS — R739 Hyperglycemia, unspecified: Secondary | ICD-10-CM | POA: Diagnosis present

## 2018-11-25 DIAGNOSIS — Z20828 Contact with and (suspected) exposure to other viral communicable diseases: Secondary | ICD-10-CM | POA: Diagnosis present

## 2018-11-25 DIAGNOSIS — Z7982 Long term (current) use of aspirin: Secondary | ICD-10-CM

## 2018-11-25 DIAGNOSIS — S72091A Other fracture of head and neck of right femur, initial encounter for closed fracture: Secondary | ICD-10-CM | POA: Diagnosis not present

## 2018-11-25 DIAGNOSIS — I10 Essential (primary) hypertension: Secondary | ICD-10-CM | POA: Diagnosis present

## 2018-11-25 DIAGNOSIS — I2510 Atherosclerotic heart disease of native coronary artery without angina pectoris: Secondary | ICD-10-CM | POA: Diagnosis not present

## 2018-11-25 DIAGNOSIS — S8991XA Unspecified injury of right lower leg, initial encounter: Secondary | ICD-10-CM | POA: Diagnosis not present

## 2018-11-25 DIAGNOSIS — F0391 Unspecified dementia with behavioral disturbance: Secondary | ICD-10-CM | POA: Diagnosis present

## 2018-11-25 DIAGNOSIS — S7291XA Unspecified fracture of right femur, initial encounter for closed fracture: Secondary | ICD-10-CM | POA: Diagnosis not present

## 2018-11-25 DIAGNOSIS — F03918 Unspecified dementia, unspecified severity, with other behavioral disturbance: Secondary | ICD-10-CM | POA: Diagnosis present

## 2018-11-25 LAB — BASIC METABOLIC PANEL
Anion gap: 10 (ref 5–15)
BUN: 20 mg/dL (ref 8–23)
CO2: 23 mmol/L (ref 22–32)
Calcium: 9.5 mg/dL (ref 8.9–10.3)
Chloride: 106 mmol/L (ref 98–111)
Creatinine, Ser: 1.11 mg/dL (ref 0.61–1.24)
GFR calc Af Amer: >60 mL/min (ref >60)
GFR calc non Af Amer: 59 mL/min — ABNORMAL LOW (ref >60)
Glucose, Bld: 131 mg/dL — ABNORMAL HIGH (ref 70–99)
Potassium: 4.1 mmol/L (ref 3.5–5.1)
Sodium: 139 mmol/L (ref 135–145)

## 2018-11-25 LAB — CBC WITH DIFFERENTIAL/PLATELET
Abs Immature Granulocytes: 0.07 10*3/uL (ref 0.00–0.07)
Basophils Absolute: 0.1 10*3/uL (ref 0.0–0.1)
Basophils Relative: 1 %
Eosinophils Absolute: 0.2 10*3/uL (ref 0.0–0.5)
Eosinophils Relative: 1 %
HCT: 45.1 % (ref 39.0–52.0)
Hemoglobin: 15 g/dL (ref 13.0–17.0)
Immature Granulocytes: 0 %
Lymphocytes Relative: 12 %
Lymphs Abs: 1.9 10*3/uL (ref 0.7–4.0)
MCH: 28.9 pg (ref 26.0–34.0)
MCHC: 33.3 g/dL (ref 30.0–36.0)
MCV: 86.9 fL (ref 80.0–100.0)
Monocytes Absolute: 1.7 10*3/uL — ABNORMAL HIGH (ref 0.1–1.0)
Monocytes Relative: 10 %
Neutro Abs: 12.5 10*3/uL — ABNORMAL HIGH (ref 1.7–7.7)
Neutrophils Relative %: 76 %
Platelets: 202 10*3/uL (ref 150–400)
RBC: 5.19 MIL/uL (ref 4.22–5.81)
RDW: 13.7 % (ref 11.5–15.5)
WBC: 16.5 10*3/uL — ABNORMAL HIGH (ref 4.0–10.5)
nRBC: 0 % (ref 0.0–0.2)

## 2018-11-25 LAB — URINALYSIS, ROUTINE W REFLEX MICROSCOPIC
Bilirubin Urine: NEGATIVE
Glucose, UA: NEGATIVE mg/dL
Ketones, ur: NEGATIVE mg/dL
Leukocytes,Ua: NEGATIVE
Nitrite: POSITIVE — AB
Protein, ur: 100 mg/dL — AB
RBC / HPF: >50 RBC/hpf — ABNORMAL HIGH (ref 0–5)
Specific Gravity, Urine: 1.023 (ref 1.005–1.030)
pH: 6 (ref 5.0–8.0)

## 2018-11-25 LAB — SURGICAL PCR SCREEN
MRSA, PCR: NEGATIVE
Staphylococcus aureus: NEGATIVE

## 2018-11-25 LAB — TYPE AND SCREEN
ABO/RH(D): A POS
Antibody Screen: NEGATIVE

## 2018-11-25 LAB — PROTIME-INR
INR: 1.3 — ABNORMAL HIGH (ref 0.8–1.2)
Prothrombin Time: 15.8 seconds — ABNORMAL HIGH (ref 11.4–15.2)

## 2018-11-25 LAB — SARS CORONAVIRUS 2 BY RT PCR (HOSPITAL ORDER, PERFORMED IN ~~LOC~~ HOSPITAL LAB): SARS Coronavirus 2: NEGATIVE

## 2018-11-25 MED ORDER — FENTANYL CITRATE (PF) 100 MCG/2ML IJ SOLN: 50.0000 ug | INTRAMUSCULAR | Status: DC | PRN

## 2018-11-25 MED ORDER — DOCUSATE SODIUM 100 MG PO CAPS
100.0000 mg | ORAL_CAPSULE | Freq: Two times a day (BID) | ORAL | Status: DC
  Administered 2018-11-25: 100 mg via ORAL
  Filled 2018-11-25 (×2): qty 1

## 2018-11-25 MED ORDER — ASPIRIN EC 81 MG PO TBEC
81.0000 mg | DELAYED_RELEASE_TABLET | Freq: Every day | ORAL | Status: DC
  Administered 2018-11-27 – 2018-11-29 (×3): 81 mg via ORAL
  Filled 2018-11-25 (×3): qty 1

## 2018-11-25 MED ORDER — MELATONIN 3 MG PO TABS
3.0000 mg | ORAL_TABLET | Freq: Every evening | ORAL | Status: DC | PRN
  Filled 2018-11-25: qty 1

## 2018-11-25 MED ORDER — METHOCARBAMOL 1000 MG/10ML IJ SOLN
500.0000 mg | Freq: Four times a day (QID) | INTRAVENOUS | Status: DC | PRN
  Filled 2018-11-25: qty 5

## 2018-11-25 MED ORDER — HYDROCODONE-ACETAMINOPHEN 5-325 MG PO TABS
1.0000 | ORAL_TABLET | Freq: Four times a day (QID) | ORAL | Status: DC | PRN
  Administered 2018-11-26 – 2018-11-28 (×4): 1 via ORAL
  Filled 2018-11-25 (×4): qty 1

## 2018-11-25 MED ORDER — BISACODYL 5 MG PO TBEC
5.0000 mg | DELAYED_RELEASE_TABLET | Freq: Every day | ORAL | Status: DC | PRN
  Administered 2018-11-28: 5 mg via ORAL
  Filled 2018-11-25: qty 1

## 2018-11-25 MED ORDER — POLYETHYLENE GLYCOL 3350 17 G PO PACK: 17.0000 g | PACK | Freq: Every day | ORAL | Status: DC | PRN

## 2018-11-25 MED ORDER — PANTOPRAZOLE SODIUM 40 MG PO TBEC
40.0000 mg | DELAYED_RELEASE_TABLET | Freq: Every day | ORAL | Status: DC
  Administered 2018-11-27 – 2018-11-29 (×3): 40 mg via ORAL
  Filled 2018-11-25 (×3): qty 1

## 2018-11-25 MED ORDER — MORPHINE SULFATE (PF) 2 MG/ML IV SOLN
0.5000 mg | INTRAVENOUS | Status: DC | PRN
  Administered 2018-11-25 – 2018-11-28 (×3): 0.5 mg via INTRAVENOUS
  Filled 2018-11-25 (×3): qty 1

## 2018-11-25 MED ORDER — TAMSULOSIN HCL 0.4 MG PO CAPS
0.4000 mg | ORAL_CAPSULE | Freq: Every day | ORAL | Status: DC
  Administered 2018-11-27 – 2018-11-29 (×3): 0.4 mg via ORAL
  Filled 2018-11-25 (×3): qty 1

## 2018-11-25 MED ORDER — NICOTINE 14 MG/24HR TD PT24
14.0000 mg | MEDICATED_PATCH | Freq: Every day | TRANSDERMAL | Status: DC
  Administered 2018-11-27 – 2018-11-29 (×3): 14 mg via TRANSDERMAL
  Filled 2018-11-25 (×3): qty 1

## 2018-11-25 MED ORDER — METHOCARBAMOL 500 MG PO TABS
500.0000 mg | ORAL_TABLET | Freq: Four times a day (QID) | ORAL | Status: DC | PRN
  Administered 2018-11-25: 500 mg via ORAL
  Filled 2018-11-25: qty 1

## 2018-11-25 NOTE — Progress Notes (Signed)
RN paged [facility] Pharmacist, hospital order.  Patient disoriented x3. High fall risk. Pulling at IV and foley catheter.  Patient's daughter currently at the bedside states that she leaving.   Awaiting response from [facility].

## 2018-11-25 NOTE — ED Triage Notes (Signed)
Pt with positive hip fracture, sent from UC after falling on Friday evening.

## 2018-11-25 NOTE — Plan of Care (Signed)
  Problem: Pain Managment: Goal: General experience of comfort will improve Outcome: Progressing   Problem: Safety: Goal: Ability to remain free from injury will improve Outcome: Progressing   

## 2018-11-25 NOTE — ED Notes (Signed)
ED TO INPATIENT HANDOFF REPORT  ED Nurse Name and Phone #:  Hassan Rowan [phone]  S Name/Age/Gender Dakota [name] 85 y.o. male Room/Bed: 015C/015C  Code Status   Code Status: DNR  Home/SNF/Other Home Patient oriented to: self and time Is this baseline? Yes   Triage Complete: Triage complete  Chief Complaint rt hip fx, sent by [facility]  Triage Note Pt with positive hip fracture, sent from UC after falling on Friday evening.   Allergies Allergies  Allergen Reactions  . Warfarin And Related Other (See Comments)    Lost a lot of weight and also experienced numbness  . Atorvastatin Other (See Comments)    REACTION: MUSCLE PAIN  . Ezetimibe Other (See Comments)    REACTION: MUSCLE PAIN  . Diazepam Other (See Comments)    UNKNOWN, per pt  . Penicillins Other (See Comments)    UNKNOWN, per pt    Level of Care/Admitting Diagnosis ED Disposition    ED Disposition Condition [facility] Area: [place] [[id number]]  Level of Care: Telemetry Medical [104]  Covid Evaluation: Asymptomatic Screening Protocol (No Symptoms)  Diagnosis: Hip fracture [facility]) [[id number]]  Admitting Physician: Karmen Bongo [[id number]]  Attending Physician: Karmen Bongo [[id number]]  Estimated length of stay: 3 - 4 days  Certification:: I certify this patient will need inpatient services for at least 2 midnights  PT Class (Do Not Modify): Inpatient [101]  PT Acc Code (Do Not Modify): Private [1]       B Medical/Surgery History Past Medical History:  Diagnosis Date  . Anxiety   . Back pain, chronic   . Complete heart block ([place]) 07/18/2014   Medtronic [place] model [id number] (serial number [id number])  singe lead PPM  . Esophageal obstruction due to food impaction 2017  . GERD (gastroesophageal reflux disease) 10/2002  . Hyperlipidemia 12/1994  . Hypertension   . Hypokalemia   . Lung collapse 03/1991   Fall  . Permanent atrial fibrillation    Refused coumadin therapy  . Stroke  [facility])    Past Surgical History:  Procedure Laterality Date  . BACK SURGERY  11/91   with hardware fixation  . BIOPSY  11/27/2017   Procedure: BIOPSY;  Surgeon: Laurence Spates, MD;  Location: [place];  Service: Endoscopy;;  . CARDIAC CATHETERIZATION  06/2004   30 % stenosis, EF normal  . ESOPHAGOGASTRODUODENOSCOPY N/A 01/06/2016   Procedure: ESOPHAGOGASTRODUODENOSCOPY (EGD);  Surgeon: Otis Brace, MD;  Location: [place];  Service: Gastroenterology;  Laterality: N/A;  . ESOPHAGOGASTRODUODENOSCOPY N/A 11/27/2017   Procedure: ESOPHAGOGASTRODUODENOSCOPY (EGD);  Surgeon: Laurence Spates, MD;  Location: [facility] ENDOSCOPY;  Service: Endoscopy;  Laterality: N/A;  . ESOPHAGOGASTRODUODENOSCOPY (EGD) WITH PROPOFOL N/A 01/29/2018   Procedure: ESOPHAGOGASTRODUODENOSCOPY (EGD) WITH PROPOFOL;  Surgeon: Laurence Spates, MD;  Location: [place];  Service: Endoscopy;  Laterality: N/A;  . EYE SURGERY     Retinal bubble surgery, cataract   . FOREIGN BODY REMOVAL  01/29/2018   Procedure: FOREIGN BODY REMOVAL;  Surgeon: Laurence Spates, MD;  Location: [place];  Service: Endoscopy;;  . IR CT HEAD LTD  11/16/2017  . IR PERCUTANEOUS ART THROMBECTOMY/INFUSION INTRACRANIAL INC DIAG ANGIO  11/16/2017  . LEFT HEART CATHETERIZATION WITH CORONARY ANGIOGRAM N/A 07/18/2014   Procedure: LEFT HEART CATHETERIZATION WITH CORONARY ANGIOGRAM;  Surgeon: Jettie Booze, MD; [facility] [place], LAD mild dz, D1 80%, D2 OK, CFX system OK, RCA OK, PDA 100%, med rx  . PERMANENT PACEMAKER INSERTION N/A 07/18/2014   Procedure: PERMANENT PACEMAKER INSERTION;  Surgeon: Thompson Grayer, MD; Medtronic [place] model [phone] (serial number [id number])    . RADIOLOGY WITH ANESTHESIA N/A 11/16/2017   Procedure: RADIOLOGY WITH ANESTHESIA;  Surgeon: Luanne Bras, MD;  Location: [place];  Service: Radiology;  Laterality: N/A;  . TEMPORARY PACEMAKER INSERTION N/A 07/18/2014   Procedure: TEMPORARY PACEMAKER INSERTION;  Surgeon: Peter M Martinique, MD;   Location: [facility] CATH LAB;  Service: Cardiovascular;  Laterality: N/A;     A IV Location/Drains/Wounds Patient Lines/Drains/Airways Status   Active Line/Drains/Airways    Name:   Placement date:   Placement time:   Site:   Days:   Peripheral IV 11/25/18 Left Forearm   11/25/18    1400    Forearm   less than 1          Intake/Output Last 24 hours No intake or output data in the 24 hours ending 11/25/18 1518  Labs/Imaging Results for orders placed or performed during the hospital encounter of 11/25/18 (from the past 48 hour(s))  Basic metabolic panel     Status: Abnormal   Collection Time: 11/25/18  1:18 PM  Result Value Ref Range   Sodium 139 135 - 145 mmol/L   Potassium 4.1 3.5 - 5.1 mmol/L   Chloride 106 98 - 111 mmol/L   CO2 23 22 - 32 mmol/L   Glucose, Bld 131 (H) 70 - 99 mg/dL   BUN 20 8 - 23 mg/dL   Creatinine, Ser 1.11 0.61 - 1.24 mg/dL   Calcium 9.5 8.9 - 10.3 mg/dL   GFR calc non Af Amer 59 (L) >60 mL/min   GFR calc Af Amer >60 >60 mL/min   Anion gap 10 5 - 15    Comment: Performed at [facility], [place] [address] [place], [place] [address]  CBC WITH DIFFERENTIAL     Status: Abnormal   Collection Time: 11/25/18  1:18 PM  Result Value Ref Range   WBC 16.5 (H) 4.0 - 10.5 K/uL   RBC 5.19 4.22 - 5.81 MIL/uL   Hemoglobin 15.0 13.0 - 17.0 g/dL   HCT 45.1 39.0 - 52.0 %   MCV 86.9 80.0 - 100.0 fL   MCH 28.9 26.0 - 34.0 pg   MCHC 33.3 30.0 - 36.0 g/dL   RDW 13.7 11.5 - 15.5 %   Platelets 202 150 - 400 K/uL   nRBC 0.0 0.0 - 0.2 %   Neutrophils Relative % 76 %   Neutro Abs 12.5 (H) 1.7 - 7.7 K/uL   Lymphocytes Relative 12 %   Lymphs Abs 1.9 0.7 - 4.0 K/uL   Monocytes Relative 10 %   Monocytes Absolute 1.7 (H) 0.1 - 1.0 K/uL   Eosinophils Relative 1 %   Eosinophils Absolute 0.2 0.0 - 0.5 K/uL   Basophils Relative 1 %   Basophils Absolute 0.1 0.0 - 0.1 K/uL   Immature Granulocytes 0 %   Abs Immature Granulocytes 0.07 0.00 - 0.07 K/uL    Comment: Performed at  [facility] [address], [place], [place] [address]  Protime-INR     Status: Abnormal   Collection Time: 11/25/18  1:18 PM  Result Value Ref Range   Prothrombin Time 15.8 (H) 11.4 - 15.2 seconds   INR 1.3 (H) 0.8 - 1.2    Comment: (NOTE) INR goal varies based on device and disease states. Performed at [facility], [place] [address], [place], [place] [address]   Type and screen [place]  Status: None   Collection Time: 11/25/18  1:18 PM  Result Value Ref Range   ABO/RH(D) A POS    Antibody Screen NEG    Sample Expiration      11/28/2018,2359 Performed at [facility], [place] [address] [place], [place] [address]   SARS Coronavirus 2 (CEPHEID - Performed in [place] hospital lab), Hosp Order     Status: None   Collection Time: 11/25/18  1:57 PM   Specimen: Nasopharyngeal Swab  Result Value Ref Range   SARS Coronavirus 2 NEGATIVE NEGATIVE    Comment: (NOTE) If result is NEGATIVE SARS-CoV-2 target nucleic acids are NOT DETECTED. The SARS-CoV-2 RNA is generally detectable in upper and lower  respiratory specimens during the acute phase of infection. The lowest  concentration of SARS-CoV-2 viral copies this assay can detect is 250  copies / mL. A negative result does not preclude SARS-CoV-2 infection  and should not be used as the sole basis for treatment or other  patient management decisions.  A negative result may occur with  improper specimen collection / handling, submission of specimen other  than nasopharyngeal swab, presence of viral mutation(s) within the  areas targeted by this assay, and inadequate number of viral copies  (<250 copies / mL). A negative result must be combined with clinical  observations, patient history, and epidemiological information. If result is POSITIVE SARS-CoV-2 target nucleic acids are DETECTED. The SARS-CoV-2 RNA is generally detectable in upper and lower  respiratory specimens dur ing the acute  phase of infection.  Positive  results are indicative of active infection with SARS-CoV-2.  Clinical  correlation with patient history and other diagnostic information is  necessary to determine patient infection status.  Positive results do  not rule out bacterial infection or co-infection with other viruses. If result is PRESUMPTIVE POSTIVE SARS-CoV-2 nucleic acids MAY BE PRESENT.   A presumptive positive result was obtained on the submitted specimen  and confirmed on repeat testing.  While 2019 novel coronavirus  (SARS-CoV-2) nucleic acids may be present in the submitted sample  additional confirmatory testing may be necessary for epidemiological  and / or clinical management purposes  to differentiate between  SARS-CoV-2 and other Sarbecovirus currently known to infect humans.  If clinically indicated additional testing with an alternate test  methodology [phone]) is advised. The SARS-CoV-2 RNA is generally  detectable in upper and lower respiratory sp ecimens during the acute  phase of infection. The expected result is Negative. Fact Sheet for Patients:  StrictlyIdeas.no Fact Sheet for Healthcare Providers: BankingDealers.co.za This test is not yet approved or cleared by the [place] FDA and has been authorized for detection and/or diagnosis of SARS-CoV-2 by FDA under an Emergency Use Authorization (EUA).  This EUA will remain in effect (meaning this test can be used) for the duration of the COVID-19 declaration under Section 564(b)(1) of the Act, 21 U.S.C. section 360bbb-3(b)(1), unless the authorization is terminated or revoked sooner. Performed at [facility], [place] [address] [place], [place] [address]    Dg Pelvis Portable  Result Date: 11/25/2018 CLINICAL DATA:  Right femoral neck fracture. EXAM: PORTABLE PELVIS 1-2 VIEWS COMPARISON:  Radiographs for lumbar spine dated 10/09/2018 and CT scan of the abdomen dated  03/16/2015 FINDINGS: There is an impacted fracture of the right femoral neck. Left hip appears normal. Pelvic bones are normal. IMPRESSION: Impacted fracture of the right femoral neck. Electronically Signed   By: Lorriane Shire M.D.   On: 11/25/2018 13:50  Dg Chest Port 1 View  Result Date: 11/25/2018 CLINICAL DATA:  Right hip fracture. EXAM: PORTABLE CHEST 1 VIEW COMPARISON:  Chest x-rays dated 01/29/2018 and 12/18/2017 FINDINGS: Heart size and vascularity are normal. Lungs are clear. No effusions. Pacemaker in place. No acute bone abnormality. IMPRESSION: No active disease. Electronically Signed   By: Lorriane Shire M.D.   On: 11/25/2018 13:16   Dg Knee Right Port  Result Date: 11/25/2018 CLINICAL DATA:  RIGHT leg injury and pain.  RIGHT hip fracture. EXAM: PORTABLE RIGHT KNEE - 1-2 VIEW COMPARISON:  None. FINDINGS: No acute fracture or dislocation. No joint effusion. Chondrocalcinosis identified. No focal bony lesions are identified. IMPRESSION: No acute bony abnormality. Chondrocalcinosis. Electronically Signed   By: Margarette Canada M.D.   On: 11/25/2018 13:51   Dg Hip Unilat With Pelvis 2-3 Views Right  Result Date: 11/25/2018 CLINICAL DATA:  Pt fell 2 days ago and is having right hip pain. Constant pain but hurts more when trying to stand and bend down. No previous injury or fx. History of back surgery. EXAM: DG HIP (WITH OR WITHOUT PELVIS) 2-3V RIGHT COMPARISON:  CT of the abdomen and pelvis on 03/16/2015 FINDINGS: There is an impacted subcapital femoral neck fracture of the RIGHT hip. No dislocation. Degenerative changes are seen in the LOWER spine. Remote lumbar spine fusion. IMPRESSION: Acute impacted subcapital femoral neck fracture. Electronically Signed   By: Nolon Nations M.D.   On: 11/25/2018 11:57    Pending Labs Unresulted Labs (From admission, onward)    Start     Ordered   11/26/18 0500  CBC  Tomorrow morning,   R     11/25/18 1459   11/26/18 3244  Basic metabolic panel   Tomorrow morning,   R     11/25/18 1459   11/25/18 1336  Urinalysis, Routine w reflex microscopic  Once,   STAT     11/25/18 1335   11/25/18 1336  Urine Culture  Once,   STAT     11/25/18 1335          Vitals/Pain Today's Vitals   11/25/18 1342 11/25/18 1344 11/25/18 1345 11/25/18 1415  BP:   (!) 153/82 (!) 154/97  Pulse: 90  93 (!) 101  Resp: (!) 31  (!) 21 (!) 21  Temp: 98.2 F (36.8 C)     TempSrc: Oral     SpO2: 94%  92% 98%  Weight:  84.4 kg    Height:  5\' 8"  (1.727 m)      Isolation Precautions No active isolations  Medications Medications  aspirin EC tablet 81 mg (has no administration in time range)  pantoprazole (PROTONIX) EC tablet 40 mg (has no administration in time range)  tamsulosin (FLOMAX) capsule 0.4 mg (has no administration in time range)  Melatonin TABS 3 mg (has no administration in time range)  HYDROcodone-acetaminophen (NORCO/VICODIN) 5-325 MG per tablet 1-2 tablet (has no administration in time range)  morphine 2 MG/ML injection 0.5 mg (has no administration in time range)  methocarbamol (ROBAXIN) tablet 500 mg (has no administration in time range)    Or  methocarbamol (ROBAXIN) 500 mg in dextrose 5 % 50 mL IVPB (has no administration in time range)  docusate sodium (COLACE) capsule 100 mg (has no administration in time range)  polyethylene glycol (MIRALAX / GLYCOLAX) packet 17 g (has no administration in time range)  bisacodyl (DULCOLAX) EC tablet 5 mg (has no administration in time range)  nicotine (NICODERM CQ - dosed in mg/24  hours) patch 14 mg (has no administration in time range)    Mobility walks with device     Focused Assessments musculoskeletal     R Recommendations: See Admitting Provider Note  Report given to: RN Iona Beard  Additional Notes:

## 2018-11-25 NOTE — H&P (Signed)
History and Physical    SCOTTI KOSTA VCB:[id number] DOB: Jul 26, 1928 DOA: 11/25/2018  PCP: Tonia Ghent, MD Consultants:  Leonie Man - neurology Patient coming from:  Home - lives with 24h caregiver; NOK: [name], daughter, [phone]  Chief Complaint: hip pain after a fall  HPI: Dakota [name] is a 85 y.o. male with medical history significant of CVA; afib, not on AC due to GI bleeding; HTN; HLD; dementia; and pacemaker placement presenting with hip pain after a fall.  His sister called his daughter yesterday to report a fall Friday night about 1015.  Mechanical fall, he required help getting up.  He was able to get up in a chair yesterday and went to bed last night and was complaining of pain after getting up.  He took a couple of steps yesterday but unable to walk at all today.  Until this week, he was independently ambulatory.  He feeds himself, but it needs to be soft/pureed. He still knows family.   ED Course:  R femoral neck fracture after witnessed fall Friday night.  Was taken to Urgent Care and sent here.  Dr. Doreatha Martin will operate tomorrow.  Review of Systems:  Unable to perform  Ambulatory Status:  Ambulates with a cane > walker  Past Medical History:  Diagnosis Date   Anxiety    Back pain, chronic    Complete heart block ([place]) 07/18/2014   Medtronic [place] model [id number] (serial number [id number])  singe lead PPM   Esophageal obstruction due to food impaction 2017   GERD (gastroesophageal reflux disease) 10/2002   Hyperlipidemia 12/1994   Hypertension    Hypokalemia    Lung collapse 03/1991   Fall   Permanent atrial fibrillation    Refused coumadin therapy   Stroke [facility])     Past Surgical History:  Procedure Laterality Date   BACK SURGERY  11/91   with hardware fixation   BIOPSY  11/27/2017   Procedure: BIOPSY;  Surgeon: Laurence Spates, MD;  Location: [place];  Service: Endoscopy;;   CARDIAC CATHETERIZATION  06/2004   30 % stenosis, EF normal    ESOPHAGOGASTRODUODENOSCOPY N/A 01/06/2016   Procedure: ESOPHAGOGASTRODUODENOSCOPY (EGD);  Surgeon: Otis Brace, MD;  Location: [place];  Service: Gastroenterology;  Laterality: N/A;   ESOPHAGOGASTRODUODENOSCOPY N/A 11/27/2017   Procedure: ESOPHAGOGASTRODUODENOSCOPY (EGD);  Surgeon: Laurence Spates, MD;  [facility] END[facility]COPY;  Service: Endoscopy;  Laterality: N/A;   ESOPHAGOGASTRODUODENOSCOPY (EGD) WITH PROPOFOL N/A 01/29/2018   Procedure: ESOPHAGOGASTRODUODENOSCOPY (EGD) WITH PROPOFOL;  Surgeon: Laurence Spates, MD;  Location: [place];  Service: Endoscopy;  Laterality: N/A;   EYE SURGERY     Retinal bubble surgery, cataract    FOREIGN BODY REMOVAL  01/29/2018   Procedure: FOREIGN BODY REMOVAL;  Surgeon: Laurence Spates, MD;  Location: [place];  Service: Endoscopy;;   IR CT HEAD LTD  11/16/2017   IR PERCUTANEOUS ART THROMBECTOMY/INFUSION INTRACRANIAL INC DIAG ANGIO  11/16/2017   LEFT HEART CATHETERIZATION WITH CORONARY ANGIOGRAM N/A 07/18/2014   Procedure: LEFT HEART CATHETERIZATION WITH CORONARY ANGIOGRAM;  Surgeon: Jettie Booze, MD; Dorene Ar OK, LAD mild dz, D1 80%, D2 OK, CFX system OK, RCA OK, PDA 100%, med rx   PERMANENT PACEMAKER INSERTION N/A 07/18/2014   Procedure: PERMANENT PACEMAKER INSERTION;  Surgeon: Thompson Grayer, MD; Medtronic [place] model [phone] (serial number [id number])     RADIOLOGY WITH ANESTHESIA N/A 11/16/2017   Procedure: RADIOLOGY WITH ANESTHESIA;  Surgeon: Luanne Bras, MD;  Location: [place];  Service: Radiology;  Laterality: N/A;  TEMPORARY PACEMAKER INSERTION N/A 07/18/2014   Procedure: TEMPORARY PACEMAKER INSERTION;  Surgeon: Peter M Martinique, MD;  Location: [facility] CATH LAB;  Service: Cardiovascular;  Laterality: N/A;    Social History   Socioeconomic History   Marital status: Married    Spouse name: Not on file   Number of children: 4   Years of education: Not on file   Highest education level: Not on file  Occupational History     Occupation: retired    Comment: Programme researcher, broadcasting/film/video strain: Not on file   Food insecurity    Worry: Not on file    Inability: Not on file   Transportation needs    Medical: Not on file    Non-medical: Not on file  Tobacco Use   Smoking status: Former Smoker    Packs/day: 1.00    Years: 15.00    Pack years: 15.00    Quit date: 05/02/1953    Years since quitting: 65.6   Smokeless tobacco: Current User    Types: Chew   Tobacco comment: Quit smoking over 50 years ago  Substance and Sexual Activity   Alcohol use: No   Drug use: No   Sexual activity: Never  Lifestyle   Physical activity    Days per week: Not on file    Minutes per session: Not on file   Stress: Not on file  Relationships   Social connections    Talks on phone: Not on file    Gets together: Not on file    Attends religious service: Not on file    Active member of club or organization: Not on file    Attends meetings of clubs or organizations: Not on file    Relationship status: Not on file   Intimate partner violence    Fear of current or ex partner: Not on file    Emotionally abused: Not on file    Physically abused: Not on file    Forced sexual activity: Not on file  Other Topics Concern   Not on file  Social History Narrative   Retired from [employer], building/construction   Married 1953   4 kids   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 (she died 10-19-2018)    Allergies  Allergen Reactions   Warfarin And Related Other (See Comments)    Lost a lot of weight and also experienced numbness   Atorvastatin Other (See Comments)    REACTION: MUSCLE PAIN   Ezetimibe Other (See Comments)    REACTION: MUSCLE PAIN   Diazepam Other (See Comments)    UNKNOWN, per pt   Penicillins Other (See Comments)    UNKNOWN, per pt    Family History  Problem Relation Age of Onset   Pneumonia Mother    Hip fracture Mother    Cancer Father        splenic    Heart disease Sister        Pacer placed   Cancer Brother        throat and lung   Cancer Brother        prostate, age 24   Prostate cancer Brother    Heart disease Son    Stroke Neg Hx    Colon cancer Neg Hx     Prior to Admission medications   Medication Sig Start Date End Date Taking? Authorizing Provider  acetaminophen (TYLENOL) 325 MG tablet Take 1-2 tablets (325-650 mg total) by mouth 2 (two)  times daily as needed. 10/09/18   Tonia Ghent, MD  aspirin EC 81 MG tablet Take 1 tablet (81 mg total) by mouth daily. 10/09/18   Tonia Ghent, MD  citalopram (CELEXA) 10 MG tablet Take 1 tablet (10 mg total) by mouth daily. 08/15/18   Venancio Poisson, NP  DOXYLAMINE-DM PO Take by mouth at bedtime.    [provider]  Magnesium Hydroxide (MILK OF MAGNESIA PO) Take by mouth as needed.    [provider]  Melatonin 3 MG TABS Take 1.5 mg by mouth at bedtime. Take 1 tablet (3 mg total) by mouth at bedtime as needed.     [provider]  mirtazapine (REMERON) 7.5 MG tablet Take 1 tablet (7.5 mg total) by mouth at bedtime. 07/24/18   Venancio Poisson, NP  omeprazole (PRILOSEC) 20 MG capsule Take 1 capsule (20 mg total) by mouth daily. 05/28/18   Tonia Ghent, MD  tamsulosin (FLOMAX) 0.4 MG CAPS capsule Take 1 capsule (0.4 mg total) by mouth daily. 10/22/18   Tonia Ghent, MD    Physical Exam: Vitals:   11/25/18 1342 11/25/18 1344 11/25/18 1345 11/25/18 1415  BP:   (!) 153/82 (!) 154/97  Pulse: 90  93 (!) 101  Resp: (!) 31  (!) 21 (!) 21  Temp: 98.2 F (36.8 C)     TempSrc: Oral     SpO2: 94%  92% 98%  Weight:  84.4 kg    Height:  5\' 8"  (1.727 m)        General:  Appears calm and comfortable at rest, but repeatedly attempts to move around or get out of bed with significant pain as a result  Eyes:  PERRL, EOMI, normal lids, iris  ENT:  Hard of hearing, normal lips & tongue, mmm; edentulous  Neck:  no LAD, masses or  thyromegaly  Cardiovascular:  Irregularly irregular with mild tachycardia, no m/r/g. No LE edema.   Respiratory:   CTA bilaterally with no wheezes/rales/rhonchi.  Normal respiratory effort.  Abdomen:  soft, NT, ND, NABS  Skin:  no rash or induration seen on limited exam  Musculoskeletal:  RLE shortening without significant external rotation  Psychiatric:  demented mood and affect, speech limited, AOx1-2  Neurologic:  CN 2-12 grossly intact, moves all extremities in coordinated fashion other than RLE    Radiological Exams on Admission: Dg Pelvis Portable  Result Date: 11/25/2018 CLINICAL DATA:  Right femoral neck fracture. EXAM: PORTABLE PELVIS 1-2 VIEWS COMPARISON:  Radiographs for lumbar spine dated 10/09/2018 and CT scan of the abdomen dated 03/16/2015 FINDINGS: There is an impacted fracture of the right femoral neck. Left hip appears normal. Pelvic bones are normal. IMPRESSION: Impacted fracture of the right femoral neck. Electronically Signed   By: Lorriane Shire M.D.   On: 11/25/2018 13:50   Dg Chest Port 1 View  Result Date: 11/25/2018 CLINICAL DATA:  Right hip fracture. EXAM: PORTABLE CHEST 1 VIEW COMPARISON:  Chest x-rays dated 01/29/2018 and 12/18/2017 FINDINGS: Heart size and vascularity are normal. Lungs are clear. No effusions. Pacemaker in place. No acute bone abnormality. IMPRESSION: No active disease. Electronically Signed   By: Lorriane Shire M.D.   On: 11/25/2018 13:16   Dg Knee Right Port  Result Date: 11/25/2018 CLINICAL DATA:  RIGHT leg injury and pain.  RIGHT hip fracture. EXAM: PORTABLE RIGHT KNEE - 1-2 VIEW COMPARISON:  None. FINDINGS: No acute fracture or dislocation. No joint effusion. Chondrocalcinosis identified. No focal bony lesions are identified. IMPRESSION:  No acute bony abnormality. Chondrocalcinosis. Electronically Signed   By: Margarette Canada M.D.   On: 11/25/2018 13:51   Dg Hip Unilat With Pelvis 2-3 Views Right  Result Date: 11/25/2018 CLINICAL DATA:   Pt fell 2 days ago and is having right hip pain. Constant pain but hurts more when trying to stand and bend down. No previous injury or fx. History of back surgery. EXAM: DG HIP (WITH OR WITHOUT PELVIS) 2-3V RIGHT COMPARISON:  CT of the abdomen and pelvis on 03/16/2015 FINDINGS: There is an impacted subcapital femoral neck fracture of the RIGHT hip. No dislocation. Degenerative changes are seen in the LOWER spine. Remote lumbar spine fusion. IMPRESSION: Acute impacted subcapital femoral neck fracture. Electronically Signed   By: Nolon Nations M.D.   On: 11/25/2018 11:57    EKG: Independently reviewed.  Afib with rate 115; nonspecific ST changes with no evidence of acute ischemia   Labs on Admission: I have personally reviewed the available labs and imaging studies at the time of the admission.  Pertinent labs:   Glucose 131 WBC 16.5 INR 1.3 COVID pending  Assessment/Plan Principal Problem:   Hip fracture ([name]) Active Problems:   Essential hypertension, benign   Hyperglycemia   Atrial fibrillation with slow ventricular response ([name])   Chewing tobacco nicotine dependence   Dementia with behavioral disturbance ([name])   R hip fracture -Mechanical fall resulting in hip fracture -Orthopedics consult in AM -NPO after midnight in anticipation of surgical repair tomorrow -SCDs overnight, start Lovenox post-operatively (or as per ortho) -Pain control with Robxain, Vicodin, and Morphine prn -SW consult for rehab placement - prefers [facility] -Will need PT consult post-operatively -Hip fracture order set utilized  Afib -Mild tachycardia while in the ER, likely associated with pain -Rate controlled with pacemaker -Not on AC due to h/o GI bleeding  Dementia -Anticipate some behavioral challenges based on patient interactions while I was present -Will order telesitter -Family is willing to come sit with him if needed  HTN -He appears to not be on medication for this issue at this  time  Hyperglycemia -May be stress response -Will follow with fasting AM labs -It is unlikely that he will need acute or chronic treatment for this issue  Tobacco dependence -Tobacco Dependence: encourage cessation.   -Patch ordered at patient request.   Note: This patient has been tested and is pending for the novel coronavirus COVID-19.   DVT prophylaxis:  SCDs until approved for Lovenox by orthopedics Code Status:  DNR - confirmed with family Family Communication: Daughter was present throughout evaluation Disposition Plan:  Home once clinically improved Consults called: Orthopedics; SW, Nutrition; will need PT post-operatively  Admission status: Admit - It is my clinical opinion that admission to INPATIENT is reasonable and necessary because of the expectation that this patient will require hospital care that crosses at least 2 midnights to treat this condition based on the medical complexity of the problems presented.  Given the aforementioned information, the predictability of an adverse outcome is felt to be significant.       Karmen Bongo MD [facility]   How to contact the [facility] Attending or Consulting provider [place] or covering provider during after hours [place], for this patient?  1. Check the care team in [facility] and look for a) attending/consulting TR[facility] provider listed and b) the [facility] team listed 2. Log into [email or web address] and use [place]'s universal password to access. If you do not have the password, please contact  the hospital operator. 3. Locate the [facility] provider you are looking for under [facility] and page to a number that you can be directly reached. 4. If you still have difficulty reaching the provider, please page the [facility] (Director on Call) for the Hospitalists listed on amion for assistance.   11/25/2018, 3:03 PM

## 2018-11-25 NOTE — Progress Notes (Signed)
Ortho Progress Note  85 year old with displaced femoral neck fracture from urgent care. He will need hip hemiarthroplasty. Have discussed case with Dr. Lyla Glassing will plan for surgery tomorrow. NPO past midnight. Formal consult to follow later today vs tomorrow AM.  Shona Needles, MD [facility] [phone] (phone) [phone] (office) [email or web address]

## 2018-11-25 NOTE — ED Provider Notes (Signed)
[place]    CSN: [id number] Arrival date & time: 11/25/18  1021     History   Chief Complaint Chief Complaint  Patient presents with  . Fall  . Hip Pain    HPI Dakota [name] is a 85 y.o. male.   Dakota [name] presents with his daughter with complaints of right hip pain after a fall from standing 7/24. This was witnessed. He twisted around which caused him to trip and fall. Didn't hit head or lose consciousness. Required assistance to get up. Today was unable to ambulate. Typically he can walk across a room, to the bathroom for example. Hx of back surgery. No previous hip injury. No apparent numbness or tingling. Has been taking tylenol which has helped some with pain, taken last this morning. Patient with hx of stroke, afib, htn, nstemi.     ROS per HPI, negative if not otherwise mentioned.      Past Medical History:  Diagnosis Date  . Anxiety   . Back pain, chronic   . Complete heart block ([place]) 07/18/2014   Medtronic [place] model [id number] (serial number [id number])  singe lead PPM  . Esophageal obstruction due to food impaction 2017  . GERD (gastroesophageal reflux disease) 10/2002  . Hyperlipidemia 12/1994  . Hypertension   . Hypokalemia   . Lung collapse 03/1991   Fall  . Permanent atrial fibrillation    Refused coumadin therapy  . Stroke [facility])     Patient Active Problem List   Diagnosis Date Noted  . Dysuria 10/11/2018  . Low back pain 10/11/2018  . At risk for falls 10/11/2018  . Rash 05/30/2018  . Insomnia 01/11/2018  . Urinary frequency   . Dysphagia due to recent stroke   . Sleep disturbance   . Constipation   . History of GI bleed   . Hypotension 11/24/2017  . Prediabetes   . Subacute delirium 11/21/2017  . Chewing tobacco nicotine dependence 11/21/2017  . Acute ischemic right MCA stroke ([place]) 11/21/2017  . Cerebrovascular accident (CVA) due to thrombosis of right middle cerebral artery ([place])   . Acute deep vein thrombosis  (DVT) of proximal vein of lower extremity ([name])   . Atrial fibrillation with slow ventricular response ([place])   . NSTEMI (non-ST elevated myocardial infarction) ([place])   . CHB (complete heart block) - s/p MDT 1 lead PPM 07/17/2014  . Advance care planning 07/03/2014  . Medicare annual wellness visit, subsequent 10/28/2012  . RIB PAIN, RIGHT SIDED 01/02/2009  . CAD, NATIVE VESSEL 11/25/2008  . Unspecified vitamin D deficiency 11/24/2008  . RECENT RET DETACH PARTIAL W/SINGLE DEFECT 11/24/2008  . Essential hypertension, benign 07/21/2008  . HLD (hyperlipidemia) 05/22/2007  . GERD 05/22/2007  . Hyperglycemia 05/22/2007    Past Surgical History:  Procedure Laterality Date  . BACK SURGERY  11/91   with hardware fixation  . BIOPSY  11/27/2017   Procedure: BIOPSY;  Surgeon: Laurence Spates, MD;  Location: [place];  Service: Endoscopy;;  . CARDIAC CATHETERIZATION  06/2004   30 % stenosis, EF normal  . ESOPHAGOGASTRODUODENOSCOPY N/A 01/06/2016   Procedure: ESOPHAGOGASTRODUODENOSCOPY (EGD);  Surgeon: Otis Brace, MD;  Location: [place];  Service: Gastroenterology;  Laterality: N/A;  . ESOPHAGOGASTRODUODENOSCOPY N/A 11/27/2017   Procedure: ESOPHAGOGASTRODUODENOSCOPY (EGD);  Surgeon: Laurence Spates, MD;  Location: [facility] ENDOSCOPY;  Service: Endoscopy;  Laterality: N/A;  . ESOPHAGOGASTRODUODENOSCOPY (EGD) WITH PROPOFOL N/A 01/29/2018   Procedure: ESOPHAGOGASTRODUODENOSCOPY (EGD) WITH PROPOFOL;  Surgeon: Laurence Spates, MD;  Location: [place];  Service: Endoscopy;  Laterality: N/A;  . EYE SURGERY     Retinal bubble surgery, cataract   . FOREIGN BODY REMOVAL  01/29/2018   Procedure: FOREIGN BODY REMOVAL;  Surgeon: Laurence Spates, MD;  Location: [place];  Service: Endoscopy;;  . IR CT HEAD LTD  11/16/2017  . IR PERCUTANEOUS ART THROMBECTOMY/INFUSION INTRACRANIAL INC DIAG ANGIO  11/16/2017  . LEFT HEART CATHETERIZATION WITH CORONARY ANGIOGRAM N/A 07/18/2014   Procedure: LEFT HEART  CATHETERIZATION WITH CORONARY ANGIOGRAM;  Surgeon: Jettie Booze, MD; [facility] OK, LAD mild dz, D1 80%, D2 OK, CFX system OK, RCA OK, PDA 100%, med rx  . PERMANENT PACEMAKER INSERTION N/A 07/18/2014   Procedure: PERMANENT PACEMAKER INSERTION;  Surgeon: Thompson Grayer, MD; Medtronic [place] model [id number] (serial number [id number])    . RADIOLOGY WITH ANESTHESIA N/A 11/16/2017   Procedure: RADIOLOGY WITH ANESTHESIA;  Surgeon: Luanne Bras, MD;  Location: [place];  Service: Radiology;  Laterality: N/A;  . TEMPORARY PACEMAKER INSERTION N/A 07/18/2014   Procedure: TEMPORARY PACEMAKER INSERTION;  Surgeon: Peter M Martinique, MD;  Location: [facility] CATH LAB;  Service: Cardiovascular;  Laterality: N/A;       Home Medications    Prior to Admission medications   Medication Sig Start Date End Date Taking? Authorizing Provider  acetaminophen (TYLENOL) 325 MG tablet Take 1-2 tablets (325-650 mg total) by mouth 2 (two) times daily as needed. 10/09/18   Tonia Ghent, MD  aspirin EC 81 MG tablet Take 1 tablet (81 mg total) by mouth daily. 10/09/18   Tonia Ghent, MD  citalopram (CELEXA) 10 MG tablet Take 1 tablet (10 mg total) by mouth daily. 08/15/18   Venancio Poisson, NP  DOXYLAMINE-DM PO Take by mouth at bedtime.    [provider]  Magnesium Hydroxide (MILK OF MAGNESIA PO) Take by mouth as needed.    [provider]  Melatonin 3 MG TABS Take 1.5 mg by mouth at bedtime. Take 1 tablet (3 mg total) by mouth at bedtime as needed.     [provider]  mirtazapine (REMERON) 7.5 MG tablet Take 1 tablet (7.5 mg total) by mouth at bedtime. 07/24/18   Venancio Poisson, NP  omeprazole (PRILOSEC) 20 MG capsule Take 1 capsule (20 mg total) by mouth daily. 05/28/18   Tonia Ghent, MD  tamsulosin (FLOMAX) 0.4 MG CAPS capsule Take 1 capsule (0.4 mg total) by mouth daily. 10/22/18   Tonia Ghent, MD    Family History Family History  Problem Relation Age of Onset  . Pneumonia Mother    . Hip fracture Mother   . Cancer Father        splenic  . Heart disease Sister        Pacer placed  . Cancer Brother        throat and lung  . Cancer Brother        prostate, age 67  . Prostate cancer Brother   . Heart disease Son   . Stroke Neg Hx   . Colon cancer Neg Hx     Social History Social History   Tobacco Use  . Smoking status: Former Smoker    Packs/day: 1.00    Years: 15.00    Pack years: 15.00    Quit date: 05/02/1953    Years since quitting: 65.6  . Smokeless tobacco: Current User    Types: Chew  . Tobacco comment: Quit smoking over 50 years ago  Substance Use Topics  . Alcohol use: No  .  Drug use: No     Allergies   Warfarin and related, Atorvastatin, Ezetimibe, Diazepam, and Penicillins   Review of Systems Review of Systems   Physical Exam Triage Vital Signs ED Triage Vitals [11/25/18 1045]  Enc Vitals Group     BP 132/77     Pulse Rate (!) 114     Resp 18     Temp 98.5 F (36.9 C)     Temp src      SpO2 94 %     Weight      Height      Head Circumference      Peak Flow      Pain Score      Pain Loc      Pain Edu?      Excl. in [place]?    No data found.  Updated Vital Signs BP 132/77   Pulse (!) 114   Temp 98.5 F (36.9 C)   Resp 18   SpO2 94%   Visual Acuity Right Eye Distance:   Left Eye Distance:   Bilateral Distance:    Right Eye Near:   Left Eye Near:    Bilateral Near:     Physical Exam Constitutional:      Appearance: He is well-developed.  Cardiovascular:     Rate and Rhythm: Tachycardia present.  Pulmonary:     Effort: Pulmonary effort is normal.  Musculoskeletal:     Right hip: He exhibits decreased range of motion, tenderness and bony tenderness.     Comments: Right proximal femur tenderness on palpation; patient with obvious discomfort sitting in wheelchair; exam limited by pain  Skin:    General: Skin is warm and dry.  Neurological:     Mental Status: He is alert.      UC Treatments / Results   Labs (all labs ordered are listed, but only abnormal results are displayed) Labs Reviewed - No data to display  EKG   Radiology Dg Hip Unilat With Pelvis 2-3 Views Right  Result Date: 11/25/2018 CLINICAL DATA:  Pt fell 2 days ago and is having right hip pain. Constant pain but hurts more when trying to stand and bend down. No previous injury or fx. History of back surgery. EXAM: DG HIP (WITH OR WITHOUT PELVIS) 2-3V RIGHT COMPARISON:  CT of the abdomen and pelvis on 03/16/2015 FINDINGS: There is an impacted subcapital femoral neck fracture of the RIGHT hip. No dislocation. Degenerative changes are seen in the LOWER spine. Remote lumbar spine fusion. IMPRESSION: Acute impacted subcapital femoral neck fracture. Electronically Signed   By: Nolon Nations M.D.   On: 11/25/2018 11:57    Procedures Procedures (including critical care time)  Medications Ordered in UC Medications - No data to display  Initial Impression / Assessment and Plan / UC Course  I have reviewed the triage vital signs and the nursing notes.  Pertinent labs & imaging results that were available during my care of the patient were reviewed by me and considered in my medical decision making (see chart for details).     Femoral neck fracture on xray. Dr. Doreatha Martin made aware, patient to be admitted through ER for hospitalist admission with ortho to repair. Family  Made aware. Patient with difficulty sitting in wheelchair, assistance therefor provided to get into vehicle for transport to ER now.  Final Clinical Impressions(s) / UC Diagnoses   Final diagnoses:  Hip pain  Closed fracture of neck of right femur, initial encounter ([place])  Discharge Instructions     Per on-call orthopedist to go to ER for admission to hospital under hospitalist service for fixation of right hip, likely tomorrow.     ED Prescriptions    None     Controlled Substance Prescriptions [place] Controlled Substance Registry consulted? Not  Applicable   Zigmund Gottron, NP 11/25/18 1228

## 2018-11-25 NOTE — Telephone Encounter (Signed)
Called and LMOVM for patient's daughter.  App help of all involved.

## 2018-11-25 NOTE — ED Triage Notes (Signed)
Pt family memer states pt stumbled and fell a few days ago, landed on his R hip. Pt has dementia. C/o R hip pain. Denies hitting head.

## 2018-11-25 NOTE — Discharge Instructions (Signed)
Per on-call orthopedist to go to ER for admission to hospital under hospitalist service for fixation of right hip, likely tomorrow.

## 2018-11-25 NOTE — ED Notes (Signed)
Attempted report 

## 2018-11-25 NOTE — ED Notes (Signed)
Condom cath place and hooked up to cath bag. Pt had episode of incontinence. Pt was cleaned up.

## 2018-11-25 NOTE — ED Provider Notes (Signed)
[facility] EMERGENCY DEPARTMENT Provider Note   CSN: [id number] Arrival date & time: 11/25/18  1239     History   Chief Complaint No chief complaint on file.   HPI ASAHD CAN is a 85 y.o. male.     Patient with history of stroke last year, pacemaker placement due to complete heart block, history of acute coronary syndrome -- presents to the emergency department today with complaint of right hip pain.  He was diagnosed with an impacted right-sided femoral neck fracture at urgent care prior to ED arrival.  Per patient's daughter, patient was confused on Friday night (today is Sunday).  He has a history of sundowning and became confused, was walking, twisted, and fell onto his right side.  Patient complained of pain.  He was able to shuffle around yesterday but today could not walk prompting emergency department visit.  Daughter states that recently he has been less active and less ambulatory since initiation of medications for his prostate.  No numbness or tingling in the leg.  No reported head injury or neck pain.  No chest pain, shortness of breath.  He has been on anticoagulation in the past which was complicated by GI bleeding so he is no longer anticoagulated.  No treatments prior to arrival.  Urgent care spoke with Dr. Doreatha Martin of orthopedic surgery prior to arrival and he plans surgery likely tomorrow per urgent care note.     Past Medical History:  Diagnosis Date  . Anxiety   . Back pain, chronic   . Complete heart block ([place]) 07/18/2014   Medtronic [place] model [id number] (serial number [id number])  singe lead PPM  . Esophageal obstruction due to food impaction 2017  . GERD (gastroesophageal reflux disease) 10/2002  . Hyperlipidemia 12/1994  . Hypertension   . Hypokalemia   . Lung collapse 03/1991   Fall  . Permanent atrial fibrillation    Refused coumadin therapy  . Stroke [facility])     Patient Active Problem List   Diagnosis Date Noted  . Dysuria  10/11/2018  . Low back pain 10/11/2018  . At risk for falls 10/11/2018  . Rash 05/30/2018  . Insomnia 01/11/2018  . Urinary frequency   . Dysphagia due to recent stroke   . Sleep disturbance   . Constipation   . History of GI bleed   . Hypotension 11/24/2017  . Prediabetes   . Subacute delirium 11/21/2017  . Chewing tobacco nicotine dependence 11/21/2017  . Acute ischemic right MCA stroke ([place]) 11/21/2017  . Cerebrovascular accident (CVA) due to thrombosis of right middle cerebral artery ([place])   . Acute deep vein thrombosis (DVT) of proximal vein of lower extremity ([name])   . Atrial fibrillation with slow ventricular response ([place])   . NSTEMI (non-ST elevated myocardial infarction) ([place])   . CHB (complete heart block) - s/p MDT 1 lead PPM 07/17/2014  . Advance care planning 07/03/2014  . Medicare annual wellness visit, subsequent 10/28/2012  . RIB PAIN, RIGHT SIDED 01/02/2009  . CAD, NATIVE VESSEL 11/25/2008  . Unspecified vitamin D deficiency 11/24/2008  . RECENT RET DETACH PARTIAL W/SINGLE DEFECT 11/24/2008  . Essential hypertension, benign 07/21/2008  . HLD (hyperlipidemia) 05/22/2007  . GERD 05/22/2007  . Hyperglycemia 05/22/2007    Past Surgical History:  Procedure Laterality Date  . BACK SURGERY  11/91   with hardware fixation  . BIOPSY  11/27/2017   Procedure: BIOPSY;  Surgeon: Laurence Spates, MD;  Location: [facility];  Service: Endoscopy;;  .  CARDIAC CATHETERIZATION  06/2004   30 % stenosis, EF normal  . ESOPHAGOGASTRODUODENOSCOPY N/A 01/06/2016   Procedure: ESOPHAGOGASTRODUODENOSCOPY (EGD);  Surgeon: Otis Brace, MD;  Location: [place];  Service: Gastroenterology;  Laterality: N/A;  . ESOPHAGOGASTRODUODENOSCOPY N/A 11/27/2017   Procedure: ESOPHAGOGASTRODUODENOSCOPY (EGD);  Surgeon: Laurence Spates, MD;  Location: [facility] ENDOSCOPY;  Service: Endoscopy;  Laterality: N/A;  . ESOPHAGOGASTRODUODENOSCOPY (EGD) WITH PROPOFOL N/A 01/29/2018   Procedure:  ESOPHAGOGASTRODUODENOSCOPY (EGD) WITH PROPOFOL;  Surgeon: Laurence Spates, MD;  Location: [place];  Service: Endoscopy;  Laterality: N/A;  . EYE SURGERY     Retinal bubble surgery, cataract   . FOREIGN BODY REMOVAL  01/29/2018   Procedure: FOREIGN BODY REMOVAL;  Surgeon: Laurence Spates, MD;  Location: [place];  Service: Endoscopy;;  . IR CT HEAD LTD  11/16/2017  . IR PERCUTANEOUS ART THROMBECTOMY/INFUSION INTRACRANIAL INC DIAG ANGIO  11/16/2017  . LEFT HEART CATHETERIZATION WITH CORONARY ANGIOGRAM N/A 07/18/2014   Procedure: LEFT HEART CATHETERIZATION WITH CORONARY ANGIOGRAM;  Surgeon: Jettie Booze, MD; [facility] [place], LAD mild dz, D1 80%, D2 OK, CFX system OK, RCA OK, PDA 100%, med rx  . PERMANENT PACEMAKER INSERTION N/A 07/18/2014   Procedure: PERMANENT PACEMAKER INSERTION;  Surgeon: Thompson Grayer, MD; Medtronic [place] model [id number] (serial number [id number])    . RADIOLOGY WITH ANESTHESIA N/A 11/16/2017   Procedure: RADIOLOGY WITH ANESTHESIA;  Surgeon: Luanne Bras, MD;  Location: [place];  Service: Radiology;  Laterality: N/A;  . TEMPORARY PACEMAKER INSERTION N/A 07/18/2014   Procedure: TEMPORARY PACEMAKER INSERTION;  Surgeon: Peter M Martinique, MD;  Location: [facility] CATH LAB;  Service: Cardiovascular;  Laterality: N/A;        Home Medications    Prior to Admission medications   Medication Sig Start Date End Date Taking? Authorizing Provider  acetaminophen (TYLENOL) 325 MG tablet Take 1-2 tablets (325-650 mg total) by mouth 2 (two) times daily as needed. 10/09/18   Tonia Ghent, MD  aspirin EC 81 MG tablet Take 1 tablet (81 mg total) by mouth daily. 10/09/18   Tonia Ghent, MD  citalopram (CELEXA) 10 MG tablet Take 1 tablet (10 mg total) by mouth daily. 08/15/18   Venancio Poisson, NP  DOXYLAMINE-DM PO Take by mouth at bedtime.    [provider]  Magnesium Hydroxide (MILK OF MAGNESIA PO) Take by mouth as needed.    [provider]  Melatonin 3 MG TABS Take 1.5  mg by mouth at bedtime. Take 1 tablet (3 mg total) by mouth at bedtime as needed.     [provider]  mirtazapine (REMERON) 7.5 MG tablet Take 1 tablet (7.5 mg total) by mouth at bedtime. 07/24/18   Venancio Poisson, NP  omeprazole (PRILOSEC) 20 MG capsule Take 1 capsule (20 mg total) by mouth daily. 05/28/18   Tonia Ghent, MD  tamsulosin (FLOMAX) 0.4 MG CAPS capsule Take 1 capsule (0.4 mg total) by mouth daily. 10/22/18   Tonia Ghent, MD    Family History Family History  Problem Relation Age of Onset  . Pneumonia Mother   . Hip fracture Mother   . Cancer Father        splenic  . Heart disease Sister        Pacer placed  . Cancer Brother        throat and lung  . Cancer Brother        prostate, age 43  . Prostate cancer Brother   . Heart disease Son   . Stroke Neg  Hx   . Colon cancer Neg Hx     Social History Social History   Tobacco Use  . Smoking status: Former Smoker    Packs/day: 1.00    Years: 15.00    Pack years: 15.00    Quit date: 05/02/1953    Years since quitting: 65.6  . Smokeless tobacco: Current User    Types: Chew  . Tobacco comment: Quit smoking over 50 years ago  Substance Use Topics  . Alcohol use: No  . Drug use: No     Allergies   Warfarin and related, Atorvastatin, Ezetimibe, Diazepam, and Penicillins   Review of Systems Review of Systems  Constitutional: Negative for fever.  HENT: Negative for rhinorrhea and sore throat.   Eyes: Negative for redness.  Respiratory: Negative for cough.   Cardiovascular: Negative for chest pain.  Gastrointestinal: Negative for abdominal pain, diarrhea, nausea and vomiting.  Genitourinary: Negative for dysuria.  Musculoskeletal: Positive for arthralgias and gait problem. Negative for myalgias.  Skin: Negative for rash.  Neurological: Negative for headaches.     Physical Exam Updated Vital Signs BP (!) 150/93 (BP Location: Right Arm)   Pulse 95   Temp 98.2 F (36.8 C) (Oral)   Resp  16   SpO2 98%   Physical Exam Vitals signs and nursing note reviewed.  Constitutional:      Appearance: He is well-developed.  HENT:     Head: Normocephalic and atraumatic.  Eyes:     General:        Right eye: No discharge.        Left eye: No discharge.     Conjunctiva/sclera: Conjunctivae normal.  Neck:     Musculoskeletal: Normal range of motion and neck supple.  Cardiovascular:     Rate and Rhythm: Tachycardia present. Rhythm irregular.     Pulses:          Dorsalis pedis pulses are 2+ on the right side and 2+ on the left side.     Heart sounds: Murmur present.  Pulmonary:     Effort: Pulmonary effort is normal.     Breath sounds: Normal breath sounds.  Abdominal:     Palpations: Abdomen is soft.     Tenderness: There is no abdominal tenderness.  Musculoskeletal:     Right hip: He exhibits decreased range of motion and tenderness.     Left hip: Normal. He exhibits normal range of motion, normal strength and no tenderness.     Right knee: Normal. No tenderness found.     Left knee: Normal. No tenderness found.     Right ankle: Normal.     Left ankle: Normal.     Lumbar back: Normal.  Skin:    General: Skin is warm and dry.  Neurological:     Mental Status: He is alert.      ED Treatments / Results  Labs (all labs ordered are listed, but only abnormal results are displayed) Labs Reviewed  BASIC METABOLIC PANEL - Abnormal; Notable for the following components:      Result Value   Glucose, Bld 131 (*)    GFR calc non Af Amer 59 (*)    All other components within normal limits  CBC WITH DIFFERENTIAL/PLATELET - Abnormal; Notable for the following components:   WBC 16.5 (*)    Neutro Abs 12.5 (*)    Monocytes Absolute 1.7 (*)    All other components within normal limits  PROTIME-INR - Abnormal; Notable for the following components:  Prothrombin Time 15.8 (*)    INR 1.3 (*)    All other components within normal limits  SARS CORONAVIRUS 2 (HOSPITAL ORDER,  [place] LAB)  URINE CULTURE  URINALYSIS, ROUTINE W REFLEX MICROSCOPIC  TYPE AND SCREEN    ED ECG REPORT   Date: 11/25/2018  Rate: 115  Rhythm: atrial fibrillation  QRS Axis: normal  Intervals: normal  ST/T Wave abnormalities: nonspecific T wave changes  Conduction Disutrbances:none  Narrative Interpretation:   Old EKG Reviewed: changed from 11/01/17, no longer v-paced, faster today  I have personally reviewed the EKG tracing and agree with the computerized printout as noted.  Radiology Dg Hip Unilat With Pelvis 2-3 Views Right  Result Date: 11/25/2018 CLINICAL DATA:  Pt fell 2 days ago and is having right hip pain. Constant pain but hurts more when trying to stand and bend down. No previous injury or fx. History of back surgery. EXAM: DG HIP (WITH OR WITHOUT PELVIS) 2-3V RIGHT COMPARISON:  CT of the abdomen and pelvis on 03/16/2015 FINDINGS: There is an impacted subcapital femoral neck fracture of the RIGHT hip. No dislocation. Degenerative changes are seen in the LOWER spine. Remote lumbar spine fusion. IMPRESSION: Acute impacted subcapital femoral neck fracture. Electronically Signed   By: Nolon Nations M.D.   On: 11/25/2018 11:57    Procedures Procedures (including critical care time)  Medications Ordered in ED Medications  fentaNYL (SUBLIMAZE) injection 50 mcg (has no administration in time range)     Initial Impression / Assessment and Plan / ED Course  I have reviewed the triage vital signs and the nursing notes.  Pertinent labs & imaging results that were available during my care of the patient were reviewed by me and considered in my medical decision making (see chart for details).        Patient seen and examined. Reviewed urgent care note and obtained history through daughter.    Vital signs reviewed and are as follows: BP (!) 150/93 (BP Location: Right Arm)   Pulse 95   Temp 98.2 F (36.8 C) (Oral)   Resp 16   SpO2 98%    Reviewed labs and imaging. Will call for admit.    2:25 PM Spoke with Dr. Lorin Mercy who will see for admit.    Final Clinical Impressions(s) / ED Diagnoses   Final diagnoses:  Closed displaced fracture of right femoral neck ([name])  Closed displaced fracture of right femoral neck ([place])   Patient with closed right femoral neck fracture.  Admit for surgical repair.    ED Discharge Orders    None       Carlisle Cater, Hershal Coria 11/25/18 1448    Lajean Saver, MD 11/25/18 [phone]

## 2018-11-26 ENCOUNTER — Inpatient Hospital Stay (HOSPITAL_COMMUNITY): Payer: Medicare Other | Admitting: Certified Registered Nurse Anesthetist

## 2018-11-26 ENCOUNTER — Encounter (HOSPITAL_COMMUNITY): Payer: Self-pay | Admitting: Surgery

## 2018-11-26 ENCOUNTER — Telehealth: Payer: Self-pay

## 2018-11-26 ENCOUNTER — Encounter (HOSPITAL_COMMUNITY)
Admission: EM | Disposition: A | Discharge status: Home / Self Care | Payer: Self-pay | Source: Home / Self Care | Attending: Internal Medicine

## 2018-11-26 ENCOUNTER — Inpatient Hospital Stay (HOSPITAL_COMMUNITY): Payer: Medicare Other

## 2018-11-26 HISTORY — PX: TOTAL HIP ARTHROPLASTY: SHX124

## 2018-11-26 LAB — CBC
HCT: 42.1 % (ref 39.0–52.0)
Hemoglobin: 14.2 g/dL (ref 13.0–17.0)
MCH: 29 pg (ref 26.0–34.0)
MCHC: 33.7 g/dL (ref 30.0–36.0)
MCV: 85.9 fL (ref 80.0–100.0)
Platelets: 176 10*3/uL (ref 150–400)
RBC: 4.9 MIL/uL (ref 4.22–5.81)
RDW: 13.7 % (ref 11.5–15.5)
WBC: 14.8 10*3/uL — ABNORMAL HIGH (ref 4.0–10.5)
nRBC: 0 % (ref 0.0–0.2)

## 2018-11-26 LAB — BASIC METABOLIC PANEL
Anion gap: 7 (ref 5–15)
BUN: 19 mg/dL (ref 8–23)
CO2: 25 mmol/L (ref 22–32)
Calcium: 9.1 mg/dL (ref 8.9–10.3)
Chloride: 106 mmol/L (ref 98–111)
Creatinine, Ser: 1.04 mg/dL (ref 0.61–1.24)
GFR calc Af Amer: >60 mL/min (ref >60)
GFR calc non Af Amer: >60 mL/min (ref >60)
Glucose, Bld: 123 mg/dL — ABNORMAL HIGH (ref 70–99)
Potassium: 4.2 mmol/L (ref 3.5–5.1)
Sodium: 138 mmol/L (ref 135–145)

## 2018-11-26 SURGERY — ARTHROPLASTY, HIP, TOTAL, ANTERIOR APPROACH
Anesthesia: General | Site: Hip | Laterality: Right

## 2018-11-26 MED ORDER — LACTATED RINGERS IV SOLN: INTRAVENOUS | Status: DC

## 2018-11-26 MED ORDER — PHENYLEPHRINE 40 MCG/ML (10ML) SYRINGE FOR IV PUSH (FOR BLOOD PRESSURE SUPPORT)
PREFILLED_SYRINGE | INTRAVENOUS | Status: AC
  Filled 2018-11-26: qty 30

## 2018-11-26 MED ORDER — ONDANSETRON HCL 4 MG/2ML IJ SOLN
INTRAMUSCULAR | Status: DC | PRN
  Administered 2018-11-26: 4 mg via INTRAVENOUS

## 2018-11-26 MED ORDER — CEFAZOLIN SODIUM-DEXTROSE 2-4 GM/100ML-% IV SOLN
INTRAVENOUS | Status: AC
  Filled 2018-11-26: qty 100

## 2018-11-26 MED ORDER — MENTHOL 3 MG MT LOZG: 1.0000 | LOZENGE | OROMUCOSAL | Status: DC | PRN

## 2018-11-26 MED ORDER — NALOXONE HCL 0.4 MG/ML IJ SOLN
INTRAMUSCULAR | Status: AC
  Filled 2018-11-26: qty 1

## 2018-11-26 MED ORDER — FENTANYL CITRATE (PF) 100 MCG/2ML IJ SOLN: 25.0000 ug | INTRAMUSCULAR | Status: DC | PRN

## 2018-11-26 MED ORDER — 0.9 % SODIUM CHLORIDE (POUR BTL) OPTIME
TOPICAL | Status: DC | PRN
  Administered 2018-11-26: 13:00:00 1000 mL

## 2018-11-26 MED ORDER — ONDANSETRON HCL 4 MG/2ML IJ SOLN: 4.0000 mg | Freq: Once | INTRAMUSCULAR | Status: DC | PRN

## 2018-11-26 MED ORDER — LIDOCAINE 2% (20 MG/ML) 5 ML SYRINGE
INTRAMUSCULAR | Status: AC
  Filled 2018-11-26: qty 5

## 2018-11-26 MED ORDER — SODIUM CHLORIDE 0.9 % IV SOLN
INTRAVENOUS | Status: DC | PRN
  Administered 2018-11-26: 13:00:00 30 ug/min via INTRAVENOUS

## 2018-11-26 MED ORDER — TRANEXAMIC ACID-NACL 1000-0.7 MG/100ML-% IV SOLN
INTRAVENOUS | Status: AC
  Filled 2018-11-26: qty 100

## 2018-11-26 MED ORDER — CEFAZOLIN SODIUM-DEXTROSE 2-4 GM/100ML-% IV SOLN
2.0000 g | Freq: Four times a day (QID) | INTRAVENOUS | Status: AC
  Administered 2018-11-26 (×2): 2 g via INTRAVENOUS
  Filled 2018-11-26 (×2): qty 100

## 2018-11-26 MED ORDER — DOCUSATE SODIUM 100 MG PO CAPS
100.0000 mg | ORAL_CAPSULE | Freq: Two times a day (BID) | ORAL | Status: DC
  Administered 2018-11-26 – 2018-11-29 (×6): 100 mg via ORAL
  Filled 2018-11-26 (×6): qty 1

## 2018-11-26 MED ORDER — PHENOL 1.4 % MT LIQD: 1.0000 | OROMUCOSAL | Status: DC | PRN

## 2018-11-26 MED ORDER — PHENYLEPHRINE 40 MCG/ML (10ML) SYRINGE FOR IV PUSH (FOR BLOOD PRESSURE SUPPORT)
PREFILLED_SYRINGE | INTRAVENOUS | Status: DC | PRN
  Administered 2018-11-26: 120 ug via INTRAVENOUS
  Administered 2018-11-26: 80 ug via INTRAVENOUS
  Administered 2018-11-26: 120 ug via INTRAVENOUS
  Administered 2018-11-26: 80 ug via INTRAVENOUS
  Administered 2018-11-26: 25 ug via INTRAVENOUS
  Administered 2018-11-26: 120 ug via INTRAVENOUS

## 2018-11-26 MED ORDER — ROCURONIUM BROMIDE 10 MG/ML (PF) SYRINGE
PREFILLED_SYRINGE | INTRAVENOUS | Status: DC | PRN
  Administered 2018-11-26: 20 mg via INTRAVENOUS
  Administered 2018-11-26: 40 mg via INTRAVENOUS
  Administered 2018-11-26: 10 mg via INTRAVENOUS

## 2018-11-26 MED ORDER — KETOROLAC TROMETHAMINE 30 MG/ML IJ SOLN
INTRAMUSCULAR | Status: AC
  Filled 2018-11-26: qty 1

## 2018-11-26 MED ORDER — KETOROLAC TROMETHAMINE 30 MG/ML IJ SOLN
INTRAMUSCULAR | Status: DC | PRN
  Administered 2018-11-26: 30 mg via INTRAVENOUS

## 2018-11-26 MED ORDER — SODIUM CHLORIDE 0.9 % IR SOLN
Status: DC | PRN
  Administered 2018-11-26: 3000 mL

## 2018-11-26 MED ORDER — ONDANSETRON HCL 4 MG/2ML IJ SOLN: 4.0000 mg | Freq: Four times a day (QID) | INTRAMUSCULAR | Status: DC | PRN

## 2018-11-26 MED ORDER — LIDOCAINE 2% (20 MG/ML) 5 ML SYRINGE
INTRAMUSCULAR | Status: DC | PRN
  Administered 2018-11-26: 60 mg via INTRAVENOUS

## 2018-11-26 MED ORDER — CEFAZOLIN SODIUM-DEXTROSE 2-4 GM/100ML-% IV SOLN
2.0000 g | INTRAVENOUS | Status: AC
  Administered 2018-11-26: 2 g via INTRAVENOUS

## 2018-11-26 MED ORDER — ONDANSETRON HCL 4 MG/2ML IJ SOLN
INTRAMUSCULAR | Status: AC
  Filled 2018-11-26: qty 2

## 2018-11-26 MED ORDER — POVIDONE-IODINE 10 % EX SWAB: 2.0000 "application " | Freq: Once | CUTANEOUS | Status: DC

## 2018-11-26 MED ORDER — DEXAMETHASONE SODIUM PHOSPHATE 10 MG/ML IJ SOLN
INTRAMUSCULAR | Status: AC
  Filled 2018-11-26: qty 1

## 2018-11-26 MED ORDER — LACTATED RINGERS IV SOLN
INTRAVENOUS | Status: DC | PRN
  Administered 2018-11-26 (×2): via INTRAVENOUS

## 2018-11-26 MED ORDER — QUETIAPINE FUMARATE 25 MG PO TABS
25.0000 mg | ORAL_TABLET | Freq: Every day | ORAL | Status: DC
  Administered 2018-11-26 – 2018-11-28 (×3): 25 mg via ORAL
  Filled 2018-11-26 (×3): qty 1

## 2018-11-26 MED ORDER — SODIUM CHLORIDE 0.9 % IV SOLN
INTRAVENOUS | Status: AC | PRN
  Administered 2018-11-26: 500 mL via INTRAMUSCULAR

## 2018-11-26 MED ORDER — BUPIVACAINE-EPINEPHRINE (PF) 0.5% -1:200000 IJ SOLN
INTRAMUSCULAR | Status: DC | PRN
  Administered 2018-11-26: 30 mL via PERINEURAL

## 2018-11-26 MED ORDER — DEXAMETHASONE SODIUM PHOSPHATE 10 MG/ML IJ SOLN
INTRAMUSCULAR | Status: DC | PRN
  Administered 2018-11-26: 4 mg via INTRAVENOUS

## 2018-11-26 MED ORDER — SUGAMMADEX SODIUM 200 MG/2ML IV SOLN
INTRAVENOUS | Status: DC | PRN
  Administered 2018-11-26: 200 mg via INTRAVENOUS

## 2018-11-26 MED ORDER — PROPOFOL 10 MG/ML IV BOLUS
INTRAVENOUS | Status: DC | PRN
  Administered 2018-11-26: 20 mg via INTRAVENOUS
  Administered 2018-11-26: 30 mg via INTRAVENOUS

## 2018-11-26 MED ORDER — BUPIVACAINE-EPINEPHRINE (PF) 0.5% -1:200000 IJ SOLN
INTRAMUSCULAR | Status: AC
  Filled 2018-11-26: qty 30

## 2018-11-26 MED ORDER — METOCLOPRAMIDE HCL 5 MG/ML IJ SOLN: 5.0000 mg | Freq: Three times a day (TID) | INTRAMUSCULAR | Status: DC | PRN

## 2018-11-26 MED ORDER — FENTANYL CITRATE (PF) 250 MCG/5ML IJ SOLN
INTRAMUSCULAR | Status: AC
  Filled 2018-11-26: qty 5

## 2018-11-26 MED ORDER — ROCURONIUM BROMIDE 10 MG/ML (PF) SYRINGE
PREFILLED_SYRINGE | INTRAVENOUS | Status: AC
  Filled 2018-11-26: qty 10

## 2018-11-26 MED ORDER — ONDANSETRON HCL 4 MG PO TABS: 4.0000 mg | ORAL_TABLET | Freq: Four times a day (QID) | ORAL | Status: DC | PRN

## 2018-11-26 MED ORDER — ENSURE PRE-SURGERY PO LIQD
296.0000 mL | Freq: Once | ORAL | Status: DC
  Filled 2018-11-26: qty 296

## 2018-11-26 MED ORDER — SUCCINYLCHOLINE CHLORIDE 200 MG/10ML IV SOSY
PREFILLED_SYRINGE | INTRAVENOUS | Status: DC | PRN
  Administered 2018-11-26: 80 mg via INTRAVENOUS

## 2018-11-26 MED ORDER — SODIUM CHLORIDE (PF) 0.9 % IJ SOLN
INTRAMUSCULAR | Status: DC | PRN
  Administered 2018-11-26: 30 mL via INTRAVENOUS

## 2018-11-26 MED ORDER — METOCLOPRAMIDE HCL 5 MG PO TABS: 5.0000 mg | ORAL_TABLET | Freq: Three times a day (TID) | ORAL | Status: DC | PRN

## 2018-11-26 MED ORDER — TRANEXAMIC ACID-NACL 1000-0.7 MG/100ML-% IV SOLN
1000.0000 mg | INTRAVENOUS | Status: AC
  Administered 2018-11-26: 1000 mg via INTRAVENOUS

## 2018-11-26 MED ORDER — ENSURE ENLIVE PO LIQD
237.0000 mL | Freq: Two times a day (BID) | ORAL | Status: DC
  Administered 2018-11-27 – 2018-11-29 (×5): 237 mL via ORAL

## 2018-11-26 MED ORDER — FENTANYL CITRATE (PF) 100 MCG/2ML IJ SOLN
INTRAMUSCULAR | Status: DC | PRN
  Administered 2018-11-26: 50 ug via INTRAVENOUS
  Administered 2018-11-26: 25 ug via INTRAVENOUS
  Administered 2018-11-26: 75 ug via INTRAVENOUS

## 2018-11-26 MED ORDER — CHLORHEXIDINE GLUCONATE 4 % EX LIQD
60.0000 mL | Freq: Once | CUTANEOUS | Status: AC
  Administered 2018-11-26: 4 via TOPICAL
  Filled 2018-11-26: qty 15

## 2018-11-26 MED ORDER — ENOXAPARIN SODIUM 40 MG/0.4ML ~~LOC~~ SOLN
40.0000 mg | SUBCUTANEOUS | Status: DC
  Administered 2018-11-27 – 2018-11-29 (×3): 40 mg via SUBCUTANEOUS
  Filled 2018-11-26 (×3): qty 0.4

## 2018-11-26 MED ORDER — PHENYLEPHRINE HCL (PRESSORS) 10 MG/ML IV SOLN
INTRAVENOUS | Status: AC
  Filled 2018-11-26: qty 1

## 2018-11-26 SURGICAL SUPPLY — 70 items
ALCOHOL 70% 16 OZ (MISCELLANEOUS) ×3 IMPLANT
BALL HIP DEPUY 53 (Hips) IMPLANT
BLADE CLIPPER SURG (BLADE) IMPLANT
CHLORAPREP W/TINT 26 (MISCELLANEOUS) ×3 IMPLANT
COVER SURGICAL LIGHT HANDLE (MISCELLANEOUS) ×3 IMPLANT
COVER WAND RF STERILE (DRAPES) ×3 IMPLANT
DERMABOND ADVANCED (GAUZE/BANDAGES/DRESSINGS) ×4
DERMABOND ADVANCED .7 DNX12 (GAUZE/BANDAGES/DRESSINGS) ×2 IMPLANT
DRAPE C-ARM 42X72 X-RAY (DRAPES) ×3 IMPLANT
DRAPE STERI IOBAN 125X83 (DRAPES) ×3 IMPLANT
DRAPE U-SHAPE 47X51 STRL (DRAPES) ×9 IMPLANT
DRSG AQUACEL AG ADV 3.5X10 (GAUZE/BANDAGES/DRESSINGS) ×3 IMPLANT
ELECT BLADE 4.0 EZ CLEAN MEGAD (MISCELLANEOUS) ×3
ELECT PENCIL ROCKER SW 15FT (MISCELLANEOUS) ×3 IMPLANT
ELECT REM PT RETURN 9FT ADLT (ELECTROSURGICAL) ×3
ELECTRODE BLDE 4.0 EZ CLN MEGD (MISCELLANEOUS) ×1 IMPLANT
ELECTRODE REM PT RTRN 9FT ADLT (ELECTROSURGICAL) ×1 IMPLANT
EVACUATOR 1/8 PVC DRAIN (DRAIN) IMPLANT
GLOVE BIO SURGEON STRL SZ 6.5 (GLOVE) ×2 IMPLANT
GLOVE BIO SURGEON STRL SZ7 (GLOVE) ×2 IMPLANT
GLOVE BIO SURGEON STRL SZ8.5 (GLOVE) ×6 IMPLANT
GLOVE BIO SURGEONS STRL SZ 6.5 (GLOVE) ×2
GLOVE BIOGEL PI IND STRL 7.0 (GLOVE) IMPLANT
GLOVE BIOGEL PI IND STRL 8.5 (GLOVE) ×1 IMPLANT
GLOVE BIOGEL PI INDICATOR 7.0 (GLOVE) ×6
GLOVE BIOGEL PI INDICATOR 8.5 (GLOVE) ×2
GOWN STRL REUS W/ TWL LRG LVL3 (GOWN DISPOSABLE) ×2 IMPLANT
GOWN STRL REUS W/TWL 2XL LVL3 (GOWN DISPOSABLE) ×3 IMPLANT
GOWN STRL REUS W/TWL LRG LVL3 (GOWN DISPOSABLE) ×8
HANDPIECE INTERPULSE COAX TIP (DISPOSABLE) ×2
HIP BALL DEPUY 53 (Hips) ×3 IMPLANT
HOOD PEEL AWAY FACE SHEILD DIS (HOOD) ×6 IMPLANT
HOOD PEEL AWAY FLYTE STAYCOOL (MISCELLANEOUS) ×2 IMPLANT
JET LAVAGE IRRISEPT WOUND (IRRIGATION / IRRIGATOR) ×3
KIT BASIN OR (CUSTOM PROCEDURE TRAY) ×3 IMPLANT
KIT TURNOVER KIT B (KITS) ×3 IMPLANT
LAVAGE JET IRRISEPT WOUND (IRRIGATION / IRRIGATOR) IMPLANT
MANIFOLD NEPTUNE II (INSTRUMENTS) ×3 IMPLANT
MARKER SKIN DUAL TIP RULER LAB (MISCELLANEOUS) ×6 IMPLANT
NDL 18GX1X1/2 (RX/OR ONLY) (NEEDLE) IMPLANT
NDL SPNL 18GX3.5 QUINCKE PK (NEEDLE) ×1 IMPLANT
NEEDLE 18GX1X1/2 (RX/OR ONLY) (NEEDLE) ×3 IMPLANT
NEEDLE SPNL 18GX3.5 QUINCKE PK (NEEDLE) ×3 IMPLANT
NS IRRIG 1000ML POUR BTL (IV SOLUTION) ×3 IMPLANT
PACK TOTAL JOINT (CUSTOM PROCEDURE TRAY) ×3 IMPLANT
PACK UNIVERSAL I (CUSTOM PROCEDURE TRAY) ×3 IMPLANT
PAD ARMBOARD 7.5X6 YLW CONV (MISCELLANEOUS) ×6 IMPLANT
SAW OSC TIP CART 19.5X105X1.3 (SAW) ×3 IMPLANT
SEALER BIPOLAR AQUA 6.0 (INSTRUMENTS) ×2 IMPLANT
SET HNDPC FAN SPRY TIP SCT (DISPOSABLE) ×1 IMPLANT
SOL PREP POV-IOD 4OZ 10% (MISCELLANEOUS) ×3 IMPLANT
SPACER FEM TAPERED +0 12/14 (Hips) ×2 IMPLANT
STEM TRI LOC BPS SZ7 W GRIPTON (Hips) IMPLANT
SUT ETHIBOND NAB CT1 #1 30IN (SUTURE) ×6 IMPLANT
SUT MNCRL AB 3-0 PS2 18 (SUTURE) ×3 IMPLANT
SUT MON AB 2-0 CT1 36 (SUTURE) ×3 IMPLANT
SUT VIC AB 1 CT1 27 (SUTURE) ×2
SUT VIC AB 1 CT1 27XBRD ANBCTR (SUTURE) ×1 IMPLANT
SUT VIC AB 2-0 CT1 27 (SUTURE) ×2
SUT VIC AB 2-0 CT1 TAPERPNT 27 (SUTURE) ×1 IMPLANT
SUT VLOC 180 0 24IN GS25 (SUTURE) ×3 IMPLANT
SYR 3ML LL SCALE MARK (SYRINGE) ×2 IMPLANT
SYR 50ML LL SCALE MARK (SYRINGE) ×3 IMPLANT
TOWEL GREEN STERILE (TOWEL DISPOSABLE) ×3 IMPLANT
TOWEL GREEN STERILE FF (TOWEL DISPOSABLE) ×3 IMPLANT
TRAY CATH 16FR W/PLASTIC CATH (SET/KITS/TRAYS/PACK) IMPLANT
TRAY FOLEY W/BAG SLVR 16FR (SET/KITS/TRAYS/PACK)
TRAY FOLEY W/BAG SLVR 16FR ST (SET/KITS/TRAYS/PACK) IMPLANT
TRI LOC BPS SZ 7 W GRIPTON (Hips) ×3 IMPLANT
WATER STERILE IRR 1000ML POUR (IV SOLUTION) ×9 IMPLANT

## 2018-11-26 NOTE — Transfer of Care (Signed)
Immediate Anesthesia Transfer of Care Note  Patient: Dakota [name]  Procedure(s) Performed: HEMI HIP ARTHROPLASTY ANTERIOR APPROACH (Right Hip)  Patient Location: PACU  Anesthesia Type:General  Level of Consciousness: sedated  Airway & Oxygen Therapy: Patient Spontanous Breathing and Patient connected to nasal cannula oxygen  Post-op Assessment: Report given to RN and Post -op Vital signs reviewed and stable  Post vital signs: Reviewed and stable  Last Vitals:  Vitals Value Taken Time  BP 119/66 11/26/18 1500  Temp 37.2 C 11/26/18 1500  Pulse 96 11/26/18 1504  Resp 12 11/26/18 1504  SpO2 99 % 11/26/18 1504  Vitals shown include unvalidated device data.  Last Pain:  Vitals:   11/26/18 1500  TempSrc:   PainSc: (P) Asleep         Complications: No apparent anesthesia complications

## 2018-11-26 NOTE — Progress Notes (Signed)
Pt returned to room 5N28 after surgery. Received report from [place], [place] in PACU. See reassessment. Will continue to monitor.

## 2018-11-26 NOTE — Op Note (Signed)
OPERATIVE REPORT  SURGEON: Rod Can, MD   ASSISTANT: Staff.  PREOPERATIVE DIAGNOSIS: Displaced Right femoral neck fracture.   POSTOPERATIVE DIAGNOSIS: Displaced Right femoral neck fracture.   PROCEDURE: Right hip hemiarthroplasty, anterior approach.   IMPLANTS: DePuy Tri Lock stem, size 7, hi offset, with a 0 mm spacer and a 53 mm monopolar head ball.  ANESTHESIA:  General  ANTIBIOTICS: 2g ancef.  ESTIMATED BLOOD LOSS:-200 mL    DRAINS: None.  COMPLICATIONS: None   CONDITION: PACU - hemodynamically stable.   BRIEF CLINICAL NOTE: Dakota [name] is a 85 y.o. male with a displaced Right femoral neck fracture. The patient was admitted to the hospitalist service and underwent perioperative risk stratification and medical optimization. The risks, benefits, and alternatives to hemiarthroplasty were explained, and the patient elected to proceed.  PROCEDURE IN DETAIL: The patient was taken to the operating room and general anesthesia was induced on the hospital bed.  The patient was then positioned on the [place] table.  All bony prominences were well padded.  The hip was prepped and draped in the normal sterile surgical fashion.  A time-out was called verifying side and site of surgery. Antibiotics were given within 60 minutes of beginning the procedure.   The direct anterior approach to the hip was performed through the Hueter interval.  Lateral femoral circumflex vessels were treated with the Auqumantys. The anterior capsule was exposed and an inverted T capsulotomy was made.  Fracture hematoma was encountered and evacuated. The patient was found to have a comminuted Right subcapital femoral neck fracture.  I freshened the femoral neck cut with a saw.  I removed the femoral neck fragment.  A corkscrew was placed into the head and the head was removed.  This was passed to the back table and was measured.   Acetabular exposure was achieved.  I examined the articular cartilage  which was intact.  The labrum was intact. A 53 mm trial head was placed and found to have excellent fit.   I then gained femoral exposure taking care to protect the abductors and greater trochanter.  This was performed using standard external rotation, extension, and adduction.  The capsule was peeled off the inner aspect of the greater trochanter, taking care to preserve the short external rotators. A cookie cutter was used to enter the femoral canal, and then the femoral canal finder was used to confirm location.  I then sequentially broached up to a size 7.  Calcar planer was used on the femoral neck remnant.  I paced a hi neck and a 36 + 1.5 head ball. The hip was reduced.  Leg lengths were checked fluoroscopically.  The hip was dislocated and trial components were removed.  I placed the real stem followed by the real spacer and head ball.  A single reduction maneuver was performed and the hip was reduced.  Fluoroscopy was used to confirm component position and leg lengths.  At 90 degrees of external rotation and extension, the hip was stable to an anterior directed force.   The wound was copiously irrigated with Irrisept solution and normal saline using pule lavage.  Marcaine solution was injected into the periarticular soft tissue.  The wound was closed in layers using #1 Vicryl and V-Loc for the fascia, 2-0 Vicryl for the subcutaneous fat, 2-0 Monocryl for the deep dermal layer, 3-0 running Monocryl subcuticular stitch and glue for the skin.  Once the glue was fully dried, an Aquacell Ag dressing was applied.  The patient was  then awakened from anesthesia and transported to the recovery room in stable condition.  Sponge, needle, and instrument counts were correct at the end of the case x2.  The patient tolerated the procedure well and there were no known complications.

## 2018-11-26 NOTE — Progress Notes (Signed)
Remote pacemaker transmission.   

## 2018-11-26 NOTE — Progress Notes (Signed)
Pt's daughter, [name], at bedside. All questions answered to satisfaction. Will continue to monitor.

## 2018-11-26 NOTE — H&P (View-Only) (Signed)
Reason for Consult:Right hip fx Referring Physician: Vada [name] is an 85 y.o. male.  HPI: Dakota [name] fell at home on Friday night. He required help getting up. Saturday he was able to take some steps but Sunday he couldn't get up at all and was brought to the ED for evaluation. X-rays showed a right hip fx and orthopedic surgery was consulted. He ambulated independently prior to this. He lives at home with a 24/7 caregiver. He is demented and cannot contribute to history which was gleaned from the chart. However he seems to reasonably and reliably answer yes/no questions and follow commands.  Past Medical History:  Diagnosis Date  . Anxiety   . Back pain, chronic   . Complete heart block ([place]) 07/18/2014   Medtronic [place] model [id number] (serial number [id number])  singe lead PPM  . Esophageal obstruction due to food impaction 2017  . GERD (gastroesophageal reflux disease) 10/2002  . Hyperlipidemia 12/1994  . Hypertension   . Hypokalemia   . Lung collapse 03/1991   Fall  . Permanent atrial fibrillation    Refused coumadin therapy  . Stroke [facility])     Past Surgical History:  Procedure Laterality Date  . BACK SURGERY  11/91   with hardware fixation  . BIOPSY  11/27/2017   Procedure: BIOPSY;  Surgeon: Laurence Spates, MD;  Location: [place];  Service: Endoscopy;;  . CARDIAC CATHETERIZATION  06/2004   30 % stenosis, EF normal  . ESOPHAGOGASTRODUODENOSCOPY N/A 01/06/2016   Procedure: ESOPHAGOGASTRODUODENOSCOPY (EGD);  Surgeon: Otis Brace, MD;  Location: [place];  Service: Gastroenterology;  Laterality: N/A;  . ESOPHAGOGASTRODUODENOSCOPY N/A 11/27/2017   Procedure: ESOPHAGOGASTRODUODENOSCOPY (EGD);  Surgeon: Laurence Spates, MD;  Location: [facility] ENDOSCOPY;  Service: Endoscopy;  Laterality: N/A;  . ESOPHAGOGASTRODUODENOSCOPY (EGD) WITH PROPOFOL N/A 01/29/2018   Procedure: ESOPHAGOGASTRODUODENOSCOPY (EGD) WITH PROPOFOL;  Surgeon: Laurence Spates, MD;  Location: [place];   Service: Endoscopy;  Laterality: N/A;  . EYE SURGERY     Retinal bubble surgery, cataract   . FOREIGN BODY REMOVAL  01/29/2018   Procedure: FOREIGN BODY REMOVAL;  Surgeon: Laurence Spates, MD;  Location: [place];  Service: Endoscopy;;  . IR CT HEAD LTD  11/16/2017  . IR PERCUTANEOUS ART THROMBECTOMY/INFUSION INTRACRANIAL INC DIAG ANGIO  11/16/2017  . LEFT HEART CATHETERIZATION WITH CORONARY ANGIOGRAM N/A 07/18/2014   Procedure: LEFT HEART CATHETERIZATION WITH CORONARY ANGIOGRAM;  Surgeon: Jettie Booze, MD; [facility] [place], LAD mild dz, D1 80%, D2 OK, CFX system OK, RCA OK, PDA 100%, med rx  . PERMANENT PACEMAKER INSERTION N/A 07/18/2014   Procedure: PERMANENT PACEMAKER INSERTION;  Surgeon: Thompson Grayer, MD; Medtronic [place] model [phone] (serial number [id number])    . RADIOLOGY WITH ANESTHESIA N/A 11/16/2017   Procedure: RADIOLOGY WITH ANESTHESIA;  Surgeon: Luanne Bras, MD;  Location: [place];  Service: Radiology;  Laterality: N/A;  . TEMPORARY PACEMAKER INSERTION N/A 07/18/2014   Procedure: TEMPORARY PACEMAKER INSERTION;  Surgeon: Peter M Martinique, MD;  Location: [facility] CATH LAB;  Service: Cardiovascular;  Laterality: N/A;    Family History  Problem Relation Age of Onset  . Pneumonia Mother   . Hip fracture Mother   . Cancer Father        splenic  . Heart disease Sister        Pacer placed  . Cancer Brother        throat and lung  . Cancer Brother        prostate, age 70  . Prostate cancer Brother   .  Heart disease Son   . Stroke Neg Hx   . Colon cancer Neg Hx     Social History:  reports that he quit smoking about 65 years ago. He has a 15.00 pack-year smoking history. His smokeless tobacco use includes chew. He reports that he does not drink alcohol or use drugs.  Allergies:  Allergies  Allergen Reactions  . Warfarin And Related Other (See Comments)    Lost a lot of weight and also experienced numbness  . Atorvastatin Other (See Comments)    REACTION: MUSCLE PAIN  .  Ezetimibe Other (See Comments)    REACTION: MUSCLE PAIN  . Diazepam Other (See Comments)    UNKNOWN, per pt  . Penicillins Other (See Comments)    UNKNOWN, per pt    Medications: I have reviewed the patient's current medications.  Results for orders placed or performed during the hospital encounter of 11/25/18 (from the past 48 hour(s))  Basic metabolic panel     Status: Abnormal   Collection Time: 11/25/18  1:18 PM  Result Value Ref Range   Sodium 139 135 - 145 mmol/L   Potassium 4.1 3.5 - 5.1 mmol/L   Chloride 106 98 - 111 mmol/L   CO2 23 22 - 32 mmol/L   Glucose, Bld 131 (H) 70 - 99 mg/dL   BUN 20 8 - 23 mg/dL   Creatinine, Ser 1.11 0.61 - 1.24 mg/dL   Calcium 9.5 8.9 - 10.3 mg/dL   GFR calc non Af Amer 59 (L) >60 mL/min   GFR calc Af Amer >60 >60 mL/min   Anion gap 10 5 - 15    Comment: Performed at [facility] [address], [place], [place] [address]  CBC WITH DIFFERENTIAL     Status: Abnormal   Collection Time: 11/25/18  1:18 PM  Result Value Ref Range   WBC 16.5 (H) 4.0 - 10.5 K/uL   RBC 5.19 4.22 - 5.81 MIL/uL   Hemoglobin 15.0 13.0 - 17.0 g/dL   HCT 45.1 39.0 - 52.0 %   MCV 86.9 80.0 - 100.0 fL   MCH 28.9 26.0 - 34.0 pg   MCHC 33.3 30.0 - 36.0 g/dL   RDW 13.7 11.5 - 15.5 %   Platelets 202 150 - 400 K/uL   nRBC 0.0 0.0 - 0.2 %   Neutrophils Relative % 76 %   Neutro Abs 12.5 (H) 1.7 - 7.7 K/uL   Lymphocytes Relative 12 %   Lymphs Abs 1.9 0.7 - 4.0 K/uL   Monocytes Relative 10 %   Monocytes Absolute 1.7 (H) 0.1 - 1.0 K/uL   Eosinophils Relative 1 %   Eosinophils Absolute 0.2 0.0 - 0.5 K/uL   Basophils Relative 1 %   Basophils Absolute 0.1 0.0 - 0.1 K/uL   Immature Granulocytes 0 %   Abs Immature Granulocytes 0.07 0.00 - 0.07 K/uL    Comment: Performed at [facility] [address], [place], [place] [address]  Protime-INR     Status: Abnormal   Collection Time: 11/25/18  1:18 PM  Result Value Ref Range   Prothrombin Time 15.8 (H) 11.4 -  15.2 seconds   INR 1.3 (H) 0.8 - 1.2    Comment: (NOTE) INR goal varies based on device and disease states. Performed at [facility], [place] [address], [place], [place] [address]   Type and screen [place]     Status: None   Collection Time: 11/25/18  1:18  PM  Result Value Ref Range   ABO/RH(D) A POS    Antibody Screen NEG    Sample Expiration      11/28/2018,2359 Performed at [facility], [place] [address], [place], [place] [address]   SARS Coronavirus 2 (CEPHEID - Performed in [place] hospital lab), Hosp Order     Status: None   Collection Time: 11/25/18  1:57 PM   Specimen: Nasopharyngeal Swab  Result Value Ref Range   SARS Coronavirus 2 NEGATIVE NEGATIVE    Comment: (NOTE) If result is NEGATIVE SARS-CoV-2 target nucleic acids are NOT DETECTED. The SARS-CoV-2 RNA is generally detectable in upper and lower  respiratory specimens during the acute phase of infection. The lowest  concentration of SARS-CoV-2 viral copies this assay can detect is 250  copies / mL. A negative result does not preclude SARS-CoV-2 infection  and should not be used as the sole basis for treatment or other  patient management decisions.  A negative result may occur with  improper specimen collection / handling, submission of specimen other  than nasopharyngeal swab, presence of viral mutation(s) within the  areas targeted by this assay, and inadequate number of viral copies  (<250 copies / mL). A negative result must be combined with clinical  observations, patient history, and epidemiological information. If result is POSITIVE SARS-CoV-2 target nucleic acids are DETECTED. The SARS-CoV-2 RNA is generally detectable in upper and lower  respiratory specimens dur ing the acute phase of infection.  Positive  results are indicative of active infection with SARS-CoV-2.  Clinical  correlation with patient history and other diagnostic information is  necessary to determine  patient infection status.  Positive results do  not rule out bacterial infection or co-infection with other viruses. If result is PRESUMPTIVE POSTIVE SARS-CoV-2 nucleic acids MAY BE PRESENT.   A presumptive positive result was obtained on the submitted specimen  and confirmed on repeat testing.  While 2019 novel coronavirus  (SARS-CoV-2) nucleic acids may be present in the submitted sample  additional confirmatory testing may be necessary for epidemiological  and / or clinical management purposes  to differentiate between  SARS-CoV-2 and other Sarbecovirus currently known to infect humans.  If clinically indicated additional testing with an alternate test  methodology [phone]) is advised. The SARS-CoV-2 RNA is generally  detectable in upper and lower respiratory sp ecimens during the acute  phase of infection. The expected result is Negative. Fact Sheet for Patients:  StrictlyIdeas.no Fact Sheet for Healthcare Providers: BankingDealers.co.za This test is not yet approved or cleared by the [place] FDA and has been authorized for detection and/or diagnosis of SARS-CoV-2 by FDA under an Emergency Use Authorization (EUA).  This EUA will remain in effect (meaning this test can be used) for the duration of the COVID-19 declaration under Section 564(b)(1) of the Act, 21 U.S.C. section 360bbb-3(b)(1), unless the authorization is terminated or revoked sooner. Performed at [facility], [place] [address], [place], [place] [address]   Urinalysis, Routine w reflex microscopic     Status: Abnormal   Collection Time: 11/25/18  1:57 PM  Result Value Ref Range   Color, Urine YELLOW YELLOW   APPearance HAZY (A) CLEAR   Specific Gravity, Urine 1.023 1.005 - 1.030   pH 6.0 5.0 - 8.0   Glucose, UA NEGATIVE NEGATIVE mg/dL   Hgb urine dipstick LARGE (A) NEGATIVE   Bilirubin Urine NEGATIVE NEGATIVE   Ketones, ur NEGATIVE NEGATIVE mg/dL    Protein, ur 100 (A)  NEGATIVE mg/dL   Nitrite POSITIVE (A) NEGATIVE   Leukocytes,Ua NEGATIVE NEGATIVE   RBC / HPF >50 (H) 0 - 5 RBC/hpf   WBC, UA 6-10 0 - 5 WBC/hpf   Bacteria, UA MANY (A) NONE SEEN   Squamous Epithelial / LPF 0-5 0 - 5   Mucus PRESENT     Comment: Performed at [facility], [place] [address], [place], [place] [address]  Surgical pcr screen     Status: None   Collection Time: 11/25/18  4:43 PM   Specimen: Nasal Mucosa; Nasal Swab  Result Value Ref Range   MRSA, PCR NEGATIVE NEGATIVE   Staphylococcus aureus NEGATIVE NEGATIVE    Comment: (NOTE) The Xpert SA Assay (FDA approved for NASAL specimens in patients 47 years of age and older), is one component of a comprehensive surveillance program. It is not intended to diagnose infection nor to guide or monitor treatment. Performed at [facility], [place] [address], [place], [place] [address]   CBC     Status: Abnormal   Collection Time: 11/26/18  7:23 AM  Result Value Ref Range   WBC 14.8 (H) 4.0 - 10.5 K/uL   RBC 4.90 4.22 - 5.81 MIL/uL   Hemoglobin 14.2 13.0 - 17.0 g/dL   HCT 42.1 39.0 - 52.0 %   MCV 85.9 80.0 - 100.0 fL   MCH 29.0 26.0 - 34.0 pg   MCHC 33.7 30.0 - 36.0 g/dL   RDW 13.7 11.5 - 15.5 %   Platelets 176 150 - 400 K/uL   nRBC 0.0 0.0 - 0.2 %    Comment: Performed at [facility], [place] [address] [place], [place] [address]  Basic metabolic panel     Status: Abnormal   Collection Time: 11/26/18  7:23 AM  Result Value Ref Range   Sodium 138 135 - 145 mmol/L   Potassium 4.2 3.5 - 5.1 mmol/L   Chloride 106 98 - 111 mmol/L   CO2 25 22 - 32 mmol/L   Glucose, Bld 123 (H) 70 - 99 mg/dL   BUN 19 8 - 23 mg/dL   Creatinine, Ser 1.04 0.61 - 1.24 mg/dL   Calcium 9.1 8.9 - 10.3 mg/dL   GFR calc non Af Amer >60 >60 mL/min   GFR calc Af Amer >60 >60 mL/min   Anion gap 7 5 - 15    Comment: Performed at [facility] [address], [place], [place] [address]    Dg Pelvis Portable  Result  Date: 11/25/2018 CLINICAL DATA:  Right femoral neck fracture. EXAM: PORTABLE PELVIS 1-2 VIEWS COMPARISON:  Radiographs for lumbar spine dated 10/09/2018 and CT scan of the abdomen dated 03/16/2015 FINDINGS: There is an impacted fracture of the right femoral neck. Left hip appears normal. Pelvic bones are normal. IMPRESSION: Impacted fracture of the right femoral neck. Electronically Signed   By: Lorriane Shire M.D.   On: 11/25/2018 13:50   Dg Chest Port 1 View  Result Date: 11/25/2018 CLINICAL DATA:  Right hip fracture. EXAM: PORTABLE CHEST 1 VIEW COMPARISON:  Chest x-rays dated 01/29/2018 and 12/18/2017 FINDINGS: Heart size and vascularity are normal. Lungs are clear. No effusions. Pacemaker in place. No acute bone abnormality. IMPRESSION: No active disease. Electronically Signed   By: Lorriane Shire M.D.   On: 11/25/2018 13:16   Dg Knee Right Port  Result Date: 11/25/2018 CLINICAL DATA:  RIGHT leg injury and pain.  RIGHT hip fracture. EXAM: PORTABLE RIGHT KNEE - 1-2  VIEW COMPARISON:  None. FINDINGS: No acute fracture or dislocation. No joint effusion. Chondrocalcinosis identified. No focal bony lesions are identified. IMPRESSION: No acute bony abnormality. Chondrocalcinosis. Electronically Signed   By: Margarette Canada M.D.   On: 11/25/2018 13:51   Dg Hip Unilat With Pelvis 2-3 Views Right  Result Date: 11/25/2018 CLINICAL DATA:  Pt fell 2 days ago and is having right hip pain. Constant pain but hurts more when trying to stand and bend down. No previous injury or fx. History of back surgery. EXAM: DG HIP (WITH OR WITHOUT PELVIS) 2-3V RIGHT COMPARISON:  CT of the abdomen and pelvis on 03/16/2015 FINDINGS: There is an impacted subcapital femoral neck fracture of the RIGHT hip. No dislocation. Degenerative changes are seen in the LOWER spine. Remote lumbar spine fusion. IMPRESSION: Acute impacted subcapital femoral neck fracture. Electronically Signed   By: Nolon Nations M.D.   On: 11/25/2018 11:57     Review of Systems  Unable to perform ROS: Dementia  Musculoskeletal: Positive for joint pain (Right hip).   Blood pressure (!) 150/82, pulse 68, temperature 97.6 F (36.4 C), temperature source Oral, resp. rate 16, height 5\' 8"  (1.727 m), weight 84.4 kg, SpO2 97 %. Physical Exam  Constitutional: He appears well-developed and well-nourished. No distress.  HENT:  Head: Normocephalic and atraumatic.  Eyes: Conjunctivae are normal. Right eye exhibits no discharge. Left eye exhibits no discharge. No scleral icterus.  Neck: Normal range of motion.  Cardiovascular: Normal rate and regular rhythm.  Respiratory: Effort normal. No respiratory distress.  Musculoskeletal:     Comments: RLE No traumatic wounds, ecchymosis, or rash  Mild TTP hip  No knee or ankle effusion  Knee stable to varus/ valgus and anterior/posterior stress  Sens DPN, SPN, TN intact  Motor EHL, ext, flex, evers grossly intact  DP 1+, PT 1+, No significant edema  Neurological: He is alert.  Skin: Skin is warm and dry. He is not diaphoretic.  Psychiatric: He has a normal mood and affect. His behavior is normal.    Assessment/Plan: Right hip fx -- Plan hemi this afternoon by Dr. Lyla Glassing. Please keep NPO. Multiple medical problems including CVA; afib, not on AC due to GI bleeding; HTN; HLD; dementia; and pacemaker placement -- per primary service.    Lisette Abu, PA-C Orthopedic Surgery [phone] 11/26/2018, 9:48 AM

## 2018-11-26 NOTE — Progress Notes (Signed)
Initial Nutrition Assessment  DOCUMENTATION CODES:   Not applicable  INTERVENTION:  Once diet advances, provide Ensure Enlive po BID, each supplement provides 350 kcal and 20 grams of protein  NUTRITION DIAGNOSIS:   Increased nutrient needs related to post-op healing as evidenced by estimated needs.  GOAL:   Patient will meet greater than or equal to 90% of their needs  MONITOR:   Diet advancement, Skin, Supplement acceptance, Weight trends, Labs, I & O's  REASON FOR ASSESSMENT:   Consult Hip fracture protocol  ASSESSMENT:   85 y.o. male with history of GERD, HTN presents after fall. X-rays showed a right hip fx.  Pt currently unavailable, in OR for right hip hemiarthroplasty. RD unable to obtain pt nutrition history at this time. Pt with no weight loss per weight records. RD to order nutritional supplements to aid in post op healing. RN to provide once diet advances.   Unable to complete Nutrition-Focused physical exam at this time.   Labs and medications reviewed.   Diet Order:   Diet Order            Diet NPO time specified Except for: Sips with Meds  Diet effective midnight        Diet NPO time specified  Diet effective midnight              EDUCATION NEEDS:   Not appropriate for education at this time  Skin:  Skin Assessment: Reviewed RN Assessment  Last BM:  Unknown  Height:   Ht Readings from Last 1 Encounters:  11/25/18 5\' 8"  (1.727 m)    Weight:   Wt Readings from Last 1 Encounters:  11/25/18 84.4 kg    Ideal Body Weight:  70 kg  BMI:  Body mass index is 28.28 kg/m.  Estimated Nutritional Needs:   Kcal:  1700-1900  Protein:  75-85 grams  Fluid:  1.7 - 1.9 L/day    Corrin Parker, MS, RD, LDN Pager # [phone] After hours/ weekend pager # [phone]

## 2018-11-26 NOTE — Telephone Encounter (Signed)
[facility] Night - Client Nonclinical Telephone Record AccessNurse Client [facility] Night - Client Client Site [place] Physician Renford Dills - MD Contact Type Call Who Is Calling Patient / Member / Family / Caregiver Caller Name [place] Phone Number [phone] Call Type Message Only Information Provided Reason for Call Returning a Call from the Office Initial Heard stated her father, Zygmund Passero, is in the hospital, and she is returning a call from Dr. Damita Dunnings about his welfare. Nobody answered the back line. He is in room [place]. Surgery scheduled for tomorrow. Additional Comment Call Closed By: Cherlynn Perches Transaction Date/Time: 11/25/2018 3:58:05 PM (ET)

## 2018-11-26 NOTE — Anesthesia Preprocedure Evaluation (Addendum)
Anesthesia Evaluation  Patient identified by MRN, date of birth, ID band Patient awake    Reviewed: Allergy & Precautions, NPO status , Patient's Chart, lab work & pertinent test results  Airway Mallampati: II  TM Distance: >3 FB     Dental  (+) Dental Advisory Given   Pulmonary former smoker,    breath sounds clear to auscultation       Cardiovascular hypertension, Pt. on medications + dysrhythmias Atrial Fibrillation + pacemaker  Rhythm:Regular Rate:Normal     Neuro/Psych CVA    GI/Hepatic Neg liver ROS, GERD  Medicated,  Endo/Other  negative endocrine ROS  Renal/GU negative Renal ROS     Musculoskeletal   Abdominal   Peds  Hematology negative hematology ROS (+)   Anesthesia Other Findings   Reproductive/Obstetrics                             Lab Results  Component Value Date   WBC 14.8 (H) 11/26/2018   HGB 14.2 11/26/2018   HCT 42.1 11/26/2018   MCV 85.9 11/26/2018   PLT 176 11/26/2018   Lab Results  Component Value Date   CREATININE 1.04 11/26/2018   BUN 19 11/26/2018   NA 138 11/26/2018   K 4.2 11/26/2018   CL 106 11/26/2018   CO2 25 11/26/2018    Anesthesia Physical Anesthesia Plan  ASA: III  Anesthesia Plan: General   Post-op Pain Management:    Induction: Intravenous  PONV Risk Score and Plan: 2 and Dexamethasone, Ondansetron and Treatment may vary due to age or medical condition  Airway Management Planned: Oral ETT  Additional Equipment:   Intra-op Plan:   Post-operative Plan: Extubation in OR  Informed Consent: I have reviewed the patients History and Physical, chart, labs and discussed the procedure including the risks, benefits and alternatives for the proposed anesthesia with the patient or authorized representative who has indicated his/her understanding and acceptance.   Patient has DNR.  Discussed DNR with power of attorney and Suspend DNR.    Dental advisory given and Consent reviewed with POA  Plan Discussed with: CRNA  Anesthesia Plan Comments:        Anesthesia Quick Evaluation

## 2018-11-26 NOTE — Plan of Care (Signed)
  Problem: Pain Managment: Goal: General experience of comfort will improve Outcome: Progressing   Problem: Safety: Goal: Ability to remain free from injury will improve Outcome: Progressing   

## 2018-11-26 NOTE — Plan of Care (Signed)

## 2018-11-26 NOTE — Progress Notes (Signed)
[facility] PROGRESS NOTE  Dakota [name] NGE:[id number] DOB: 05/15/1928 DOA: 11/25/2018 PCP: Tonia Ghent, MD  Brief summary   85 y.o. male with medical history significant of CVA; afib, not on AC due to GI bleeding; HTN; HLD; dementia; and pacemaker placement presenting with hip pain after a fall.  His sister called his daughter yesterday to report a fall Friday night about 1015.  Mechanical fall, he required help getting up.  He was able to get up in a chair yesterday and went to bed last night and was complaining of pain after getting up.  He took a couple of steps yesterday but unable to walk at all today.  Until this week, he was independently ambulatory.  He feeds himself, but it needs to be soft/pureed. He still knows family.   ED Course:  R femoral neck fracture after witnessed fall Friday night.  Was taken to Urgent Care and sent here.  for surgery by Dr. Doreatha Martin    Assessment/Plan:  R hip fracture. Mechanical fall resulting in hip fracture. Underwent Right hip hemiarthroplasty, anterior approach on 7/27. start Lovenox post-operatively (or as per ortho). Pain control with Robxain, Vicodin, and Morphine prn. SW consult for rehab placement - prefers [facility]. Obtain PT consult post-operatively  Afib. PPM. Rate controlled with pacemaker. Not on AC due to h/o GI bleeding, may need BB if tachy  Dementia. Delirium post op. order telesitter. Avoid benadryl, excessive opioids as bal.e sroquel. Low dose. Family is willing to come sit with him if needed  HTN. Not on meds at home. Monitor   Tobacco dependence. Tobacco Dependence: encourage cessation.  Patch ordered at patient request.  Leukocytosis, probable stress related. CXR: no clear infiltrates. COVID-negative. Recently treated with antibiotics as outpatient. Will f/u urine cultures.    Code Status: DNR Family Communication: d/w patient, RN. His family  (indicate person spoken with, relationship, and if by phone,  the number) Disposition Plan: remains inpatient    Consultants:  ortho  Procedures:   hip surgery   Antibiotics: Anti-infectives (From admission, onward)   Start     Dose/Rate Route Frequency Ordered Stop   11/26/18 1800  ceFAZolin (ANCEF) IVPB 2g/100 mL premix     2 g 200 mL/hr over 30 Minutes Intravenous Every 6 hours 11/26/18 1600 11/27/18 0559   11/26/18 1100  ceFAZolin (ANCEF) 2-4 GM/100ML-% IVPB    Note to Pharmacy: Laurita Quint   : cabinet override      11/26/18 1100 11/26/18 1245   11/26/18 1045  ceFAZolin (ANCEF) IVPB 2g/100 mL premix     2 g 200 mL/hr over 30 Minutes Intravenous On call to O.R. 11/26/18 1034 11/26/18 1255        (indicate start date, and stop date if known)  HPI/Subjective: Confused. Mild agitated. No acute SOB.  Objective: Vitals:   11/26/18 1545 11/26/18 1610  BP: (!) 125/95 136/90  Pulse: 89 (!) 118  Resp: 17 18  Temp: (!) 97 F (36.1 C) 97.7 F (36.5 C)  SpO2: 94% 93%    Intake/Output Summary (Last 24 hours) at 11/26/2018 1621 Last data filed at 11/26/2018 1425 Gross per 24 hour  Intake 1000 ml  Output 1400 ml  Net -400 ml   Filed Weights   11/25/18 1344  Weight: 84.4 kg    Exam:   General:  No distress   Cardiovascular: s1,s2 rrr  Respiratory: cta bl  Abdomen: soft, nt   Musculoskeletal: no leg edema    Data Reviewed: Basic  Metabolic Panel: Recent Labs  Lab 11/25/18 1318 11/26/18 0723  NA 139 138  K 4.1 4.2  CL 106 106  CO2 23 25  GLUCOSE 131* 123*  BUN 20 19  CREATININE 1.11 1.04  CALCIUM 9.5 9.1   Liver Function Tests: No results for input(s): AST, ALT, ALKPHOS, BILITOT, PROT, ALBUMIN in the last 168 hours. No results for input(s): LIPASE, AMYLASE in the last 168 hours. No results for input(s): AMMONIA in the last 168 hours. CBC: Recent Labs  Lab 11/25/18 1318 11/26/18 0723  WBC 16.5* 14.8*  NEUTROABS 12.5*  --   HGB 15.0 14.2  HCT 45.1 42.1  MCV 86.9 85.9  PLT 202 176   Cardiac  Enzymes: No results for input(s): CKTOTAL, CKMB, CKMBINDEX, TROPONINI in the last 168 hours. BNP (last 3 results) No results for input(s): BNP in the last 8760 hours.  ProBNP (last 3 results) No results for input(s): PROBNP in the last 8760 hours.  CBG: No results for input(s): GLUCAP in the last 168 hours.  Recent Results (from the past 240 hour(s))  Novel Coronavirus, NAA ([facility])  Drive up testing site only     Status: None   Collection Time: 11/19/18  8:40 AM  Result Value Ref Range Status   SARS-CoV-2, NAA Not Detected Not Detected Final    Comment: This test was developed and its performance characteristics determined by [employer]. This test has not been FDA cleared or approved. This test has been authorized by FDA under an Emergency Use Authorization (EUA). This test is only authorized for the duration of time the declaration that circumstances exist justifying the authorization of the emergency use of in vitro diagnostic tests for detection of SARS-CoV-2 virus and/or diagnosis of COVID-19 infection under section 564(b)(1) of the Act, 21 U.S.C. 998PJA-2(N)(0), unless the authorization is terminated or revoked sooner. When diagnostic testing is negative, the possibility of a false negative result should be considered in the context of a patient's recent exposures and the presence of clinical signs and symptoms consistent with COVID-19. An individual without symptoms of COVID-19 and who is not shedding SARS-CoV-2 virus would expect to have a negative (not detected) result in this assay.   SARS Coronavirus 2 (CEPHEID - Performed in [name] hospital lab), Hosp Order     Status: None   Collection Time: 11/25/18  1:57 PM   Specimen: Nasopharyngeal Swab  Result Value Ref Range Status   SARS Coronavirus 2 NEGATIVE NEGATIVE Final    Comment: (NOTE) If result is NEGATIVE SARS-CoV-2 target nucleic acids are NOT DETECTED. The SARS-CoV-2 RNA is generally detectable  in upper and lower  respiratory specimens during the acute phase of infection. The lowest  concentration of SARS-CoV-2 viral copies this assay can detect is 250  copies / mL. A negative result does not preclude SARS-CoV-2 infection  and should not be used as the sole basis for treatment or other  patient management decisions.  A negative result may occur with  improper specimen collection / handling, submission of specimen other  than nasopharyngeal swab, presence of viral mutation(s) within the  areas targeted by this assay, and inadequate number of viral copies  (<250 copies / mL). A negative result must be combined with clinical  observations, patient history, and epidemiological information. If result is POSITIVE SARS-CoV-2 target nucleic acids are DETECTED. The SARS-CoV-2 RNA is generally detectable in upper and lower  respiratory specimens dur ing the acute phase of infection.  Positive  results are indicative of  active infection with SARS-CoV-2.  Clinical  correlation with patient history and other diagnostic information is  necessary to determine patient infection status.  Positive results do  not rule out bacterial infection or co-infection with other viruses. If result is PRESUMPTIVE POSTIVE SARS-CoV-2 nucleic acids MAY BE PRESENT.   A presumptive positive result was obtained on the submitted specimen  and confirmed on repeat testing.  While 2019 novel coronavirus  (SARS-CoV-2) nucleic acids may be present in the submitted sample  additional confirmatory testing may be necessary for epidemiological  and / or clinical management purposes  to differentiate between  SARS-CoV-2 and other Sarbecovirus currently known to infect humans.  If clinically indicated additional testing with an alternate test  methodology [id number]) is advised. The SARS-CoV-2 RNA is generally  detectable in upper and lower respiratory sp ecimens during the acute  phase of infection. The expected result is  Negative. Fact Sheet for Patients:  StrictlyIdeas.no Fact Sheet for Healthcare Providers: BankingDealers.co.za This test is not yet approved or cleared by the [place] FDA and has been authorized for detection and/or diagnosis of SARS-CoV-2 by FDA under an Emergency Use Authorization (EUA).  This EUA will remain in effect (meaning this test can be used) for the duration of the COVID-19 declaration under Section 564(b)(1) of the Act, 21 U.S.C. section 360bbb-3(b)(1), unless the authorization is terminated or revoked sooner. Performed at [facility], [place] [address] [place], [place] [address]   Surgical pcr screen     Status: None   Collection Time: 11/25/18  4:43 PM   Specimen: Nasal Mucosa; Nasal Swab  Result Value Ref Range Status   MRSA, PCR NEGATIVE NEGATIVE Final   Staphylococcus aureus NEGATIVE NEGATIVE Final    Comment: (NOTE) The Xpert SA Assay (FDA approved for NASAL specimens in patients 50 years of age and older), is one component of a comprehensive surveillance program. It is not intended to diagnose infection nor to guide or monitor treatment. Performed at [facility], [place] [address], [place], [place] [address]   Urine Culture     Status: None (Preliminary result)   Collection Time: 11/25/18  7:38 PM   Specimen: Urine, Clean Catch  Result Value Ref Range Status   Specimen Description URINE, CLEAN CATCH  Final   Special Requests NONE  Final   Culture   Final    CULTURE REINCUBATED FOR BETTER GROWTH Performed at [facility], [place] [address], [place], [place] [address]    Report Status PENDING  Incomplete     Studies: Dg Pelvis Portable  Result Date: 11/25/2018 CLINICAL DATA:  Right femoral neck fracture. EXAM: PORTABLE PELVIS 1-2 VIEWS COMPARISON:  Radiographs for lumbar spine dated 10/09/2018 and CT scan of the abdomen dated 03/16/2015 FINDINGS: There is an impacted fracture of the right  femoral neck. Left hip appears normal. Pelvic bones are normal. IMPRESSION: Impacted fracture of the right femoral neck. Electronically Signed   By: Lorriane Shire M.D.   On: 11/25/2018 13:50   Dg Chest Port 1 View  Result Date: 11/25/2018 CLINICAL DATA:  Right hip fracture. EXAM: PORTABLE CHEST 1 VIEW COMPARISON:  Chest x-rays dated 01/29/2018 and 12/18/2017 FINDINGS: Heart size and vascularity are normal. Lungs are clear. No effusions. Pacemaker in place. No acute bone abnormality. IMPRESSION: No active disease. Electronically Signed   By: Lorriane Shire M.D.   On: 11/25/2018 13:16   Dg Knee Right Port  Result Date: 11/25/2018 CLINICAL DATA:  RIGHT leg injury and  pain.  RIGHT hip fracture. EXAM: PORTABLE RIGHT KNEE - 1-2 VIEW COMPARISON:  None. FINDINGS: No acute fracture or dislocation. No joint effusion. Chondrocalcinosis identified. No focal bony lesions are identified. IMPRESSION: No acute bony abnormality. Chondrocalcinosis. Electronically Signed   By: Margarette Canada M.D.   On: 11/25/2018 13:51   Dg C-arm 1-60 Min  Result Date: 11/26/2018 CLINICAL DATA:  Arthroplasty anterior approach. EXAM: OPERATIVE RIGHT HIP (WITH PELVIS IF PERFORMED) 2 VIEWS TECHNIQUE: Fluoroscopic spot image(s) were submitted for interpretation post-operatively. COMPARISON:  None. FINDINGS: Intraoperative fluoroscopic images demonstrating a RIGHT hip arthroplasty. Hardware appears appropriately positioned. Osseous alignment appears anatomic. Fluoroscopy provided for 12 seconds. IMPRESSION: Intraoperative fluoroscopic images demonstrating a RIGHT hip arthroplasty. Hardware appears appropriately positioned. No evidence of surgical complicating feature. Electronically Signed   By: Franki Cabot M.D.   On: 11/26/2018 16:18   Dg Hip Operative Unilat W Or W/o Pelvis Right  Result Date: 11/26/2018 CLINICAL DATA:  Arthroplasty anterior approach. EXAM: OPERATIVE RIGHT HIP (WITH PELVIS IF PERFORMED) 2 VIEWS TECHNIQUE: Fluoroscopic  spot image(s) were submitted for interpretation post-operatively. COMPARISON:  None. FINDINGS: Intraoperative fluoroscopic images demonstrating a RIGHT hip arthroplasty. Hardware appears appropriately positioned. Osseous alignment appears anatomic. Fluoroscopy provided for 12 seconds. IMPRESSION: Intraoperative fluoroscopic images demonstrating a RIGHT hip arthroplasty. Hardware appears appropriately positioned. No evidence of surgical complicating feature. Electronically Signed   By: Franki Cabot M.D.   On: 11/26/2018 16:18   Dg Hip Unilat With Pelvis 2-3 Views Right  Result Date: 11/25/2018 CLINICAL DATA:  Pt fell 2 days ago and is having right hip pain. Constant pain but hurts more when trying to stand and bend down. No previous injury or fx. History of back surgery. EXAM: DG HIP (WITH OR WITHOUT PELVIS) 2-3V RIGHT COMPARISON:  CT of the abdomen and pelvis on 03/16/2015 FINDINGS: There is an impacted subcapital femoral neck fracture of the RIGHT hip. No dislocation. Degenerative changes are seen in the LOWER spine. Remote lumbar spine fusion. IMPRESSION: Acute impacted subcapital femoral neck fracture. Electronically Signed   By: Nolon Nations M.D.   On: 11/25/2018 11:57    Scheduled Meds: . [START ON 11/27/2018] aspirin EC  81 mg Oral Daily  . docusate sodium  100 mg Oral BID  . [START ON 11/27/2018] enoxaparin (LOVENOX) injection  40 mg Subcutaneous Q24H  . [START ON 11/27/2018] feeding supplement (ENSURE ENLIVE)  237 mL Oral BID BM  . nicotine  14 mg Transdermal Daily  . pantoprazole  40 mg Oral Daily  . tamsulosin  0.4 mg Oral Daily   Continuous Infusions: .  ceFAZolin (ANCEF) IV    . methocarbamol (ROBAXIN) IV      Principal Problem:   Hip fracture ([name]) Active Problems:   Essential hypertension, benign   Hyperglycemia   Atrial fibrillation with slow ventricular response ([name])   Chewing tobacco nicotine dependence   Dementia with behavioral disturbance ([place])    Time spent:  >25 minutes     Kinnie Feil  [facility] Pager [phone]. If 7PM-7AM, please contact night-coverage at [email or web address], password [facility] 11/26/2018, 4:21 PM  LOS: 1 day

## 2018-11-26 NOTE — Discharge Instructions (Signed)
°Dr. Carlie Corpus °Joint Replacement Specialist °[facility] °[address] °[place], [place] [address] °[phone] ° ° °TOTAL HIP REPLACEMENT POSTOPERATIVE DIRECTIONS ° ° ° °Hip Rehabilitation, Guidelines Following Surgery  ° °WEIGHT BEARING °Weight bearing as tolerated with assist device (walker, cane, etc) as directed, use it as long as suggested by your surgeon or therapist, typically at least 4-6 weeks. ° °The results of a hip operation are greatly improved after range of motion and muscle strengthening exercises. Follow all safety measures which are given to protect your hip. If any of these exercises cause increased pain or swelling in your joint, decrease the amount until you are comfortable again. Then slowly increase the exercises. Call your caregiver if you have problems or questions.  ° °HOME CARE INSTRUCTIONS  °Most of the following instructions are designed to prevent the dislocation of your new hip.  °Remove items at home which could result in a fall. This includes throw rugs or furniture in walking pathways.  °Continue medications as instructed at time of discharge. °· You may have some home medications which will be placed on hold until you complete the course of blood thinner medication. °· You may start showering once you are discharged home. Do not remove your dressing. °Do not put on socks or shoes without following the instructions of your caregivers.   °Sit on chairs with arms. Use the chair arms to help push yourself up when arising.  °Arrange for the use of a toilet seat elevator so you are not sitting low.  °· Walk with walker as instructed.  °You may resume a sexual relationship in one month or when given the OK by your caregiver.  °Use walker as long as suggested by your caregivers.  °You may put full weight on your legs and walk as much as is comfortable. °Avoid periods of inactivity such as sitting longer than an hour when not asleep. This helps prevent  blood clots.  °You may return to work once you are cleared by your surgeon.  °Do not drive a car for 6 weeks or until released by your surgeon.  °Do not drive while taking narcotics.  °Wear elastic stockings for two weeks following surgery during the day but you may remove then at night.  °Make sure you keep all of your appointments after your operation with all of your doctors and caregivers. You should call the office at the above phone number and make an appointment for approximately two weeks after the date of your surgery. °Please pick up a stool softener and laxative for home use as long as you are requiring pain medications. °· ICE to the affected hip every three hours for 30 minutes at a time and then as needed for pain and swelling. Continue to use ice on the hip for pain and swelling from surgery. You may notice swelling that will progress down to the foot and ankle.  This is normal after surgery.  Elevate the leg when you are not up walking on it.   °It is important for you to complete the blood thinner medication as prescribed by your doctor. °· Continue to use the breathing machine which will help keep your temperature down.  It is common for your temperature to cycle up and down following surgery, especially at night when you are not up moving around and exerting yourself.  The breathing machine keeps your lungs expanded and your temperature down. ° °RANGE OF MOTION AND STRENGTHENING EXERCISES  °These exercises are   designed to help you keep full movement of your hip joint. Follow your caregiver's or physical therapist's instructions. Perform all exercises about fifteen times, three times per day or as directed. Exercise both hips, even if you have had only one joint replacement. These exercises can be done on a training (exercise) mat, on the floor, on a table or on a bed. Use whatever works the best and is most comfortable for you. Use music or television while you are exercising so that the exercises  are a pleasant break in your day. This will make your life better with the exercises acting as a break in routine you can look forward to.  °Lying on your back, slowly slide your foot toward your buttocks, raising your knee up off the floor. Then slowly slide your foot back down until your leg is straight again.  °Lying on your back spread your legs as far apart as you can without causing discomfort.  °Lying on your side, raise your upper leg and foot straight up from the floor as far as is comfortable. Slowly lower the leg and repeat.  °Lying on your back, tighten up the muscle in the front of your thigh (quadriceps muscles). You can do this by keeping your leg straight and trying to raise your heel off the floor. This helps strengthen the largest muscle supporting your knee.  °Lying on your back, tighten up the muscles of your buttocks both with the legs straight and with the knee bent at a comfortable angle while keeping your heel on the floor.  ° °SKILLED REHAB INSTRUCTIONS: °If the patient is transferred to a skilled rehab facility following release from the hospital, a list of the current medications will be sent to the facility for the patient to continue.  When discharged from the skilled rehab facility, please have the facility set up the patient's Home Health Physical Therapy prior to being released. Also, the skilled facility will be responsible for providing the patient with their medications at time of release from the facility to include their pain medication and their blood thinner medication. If the patient is still at the rehab facility at time of the two week follow up appointment, the skilled rehab facility will also need to assist the patient in arranging follow up appointment in our office and any transportation needs. ° °MAKE SURE YOU:  °Understand these instructions.  °Will watch your condition.  °Will get help right away if you are not doing well or get worse. ° °Pick up stool softner and  laxative for home use following surgery while on pain medications. °Do not remove your dressing. °The dressing is waterproof--it is OK to take showers. °Continue to use ice for pain and swelling after surgery. °Do not use any lotions or creams on the incision until instructed by your surgeon. °Total Hip Protocol. ° ° °

## 2018-11-26 NOTE — Consult Note (Signed)
Reason for Consult:Right hip fx Referring Physician: [name] is an 85 y.o. male.  HPI: Dakota [name] fell at home on Friday night. He required help getting up. Saturday he was able to take some steps but Sunday he couldn't get up at all and was brought to the ED for evaluation. X-rays showed a right hip fx and orthopedic surgery was consulted. He ambulated independently prior to this. He lives at home with a 24/7 caregiver. He is demented and cannot contribute to history which was gleaned from the chart. However he seems to reasonably and reliably answer yes/no questions and follow commands.  Past Medical History:  Diagnosis Date  . Anxiety   . Back pain, chronic   . Complete heart block ([place]) 07/18/2014   Medtronic [place] model [id number] (serial number [id number])  singe lead PPM  . Esophageal obstruction due to food impaction 2017  . GERD (gastroesophageal reflux disease) 10/2002  . Hyperlipidemia 12/1994  . Hypertension   . Hypokalemia   . Lung collapse 03/1991   Fall  . Permanent atrial fibrillation    Refused coumadin therapy  . Stroke [facility])     Past Surgical History:  Procedure Laterality Date  . BACK SURGERY  11/91   with hardware fixation  . BIOPSY  11/27/2017   Procedure: BIOPSY;  Surgeon: Laurence Spates, MD;  Location: [place];  Service: Endoscopy;;  . CARDIAC CATHETERIZATION  06/2004   30 % stenosis, EF normal  . ESOPHAGOGASTRODUODENOSCOPY N/A 01/06/2016   Procedure: ESOPHAGOGASTRODUODENOSCOPY (EGD);  Surgeon: Otis Brace, MD;  Location: [place];  Service: Gastroenterology;  Laterality: N/A;  . ESOPHAGOGASTRODUODENOSCOPY N/A 11/27/2017   Procedure: ESOPHAGOGASTRODUODENOSCOPY (EGD);  Surgeon: Laurence Spates, MD;  Location: [facility] ENDOSCOPY;  Service: Endoscopy;  Laterality: N/A;  . ESOPHAGOGASTRODUODENOSCOPY (EGD) WITH PROPOFOL N/A 01/29/2018   Procedure: ESOPHAGOGASTRODUODENOSCOPY (EGD) WITH PROPOFOL;  Surgeon: Laurence Spates, MD;  Location: [place];   Service: Endoscopy;  Laterality: N/A;  . EYE SURGERY     Retinal bubble surgery, cataract   . FOREIGN BODY REMOVAL  01/29/2018   Procedure: FOREIGN BODY REMOVAL;  Surgeon: Laurence Spates, MD;  Location: [place];  Service: Endoscopy;;  . IR CT HEAD LTD  11/16/2017  . IR PERCUTANEOUS ART THROMBECTOMY/INFUSION INTRACRANIAL INC DIAG ANGIO  11/16/2017  . LEFT HEART CATHETERIZATION WITH CORONARY ANGIOGRAM N/A 07/18/2014   Procedure: LEFT HEART CATHETERIZATION WITH CORONARY ANGIOGRAM;  Surgeon: Jettie Booze, MD; [facility] [place], LAD mild dz, D1 80%, D2 OK, CFX system OK, RCA OK, PDA 100%, med rx  . PERMANENT PACEMAKER INSERTION N/A 07/18/2014   Procedure: PERMANENT PACEMAKER INSERTION;  Surgeon: Thompson Grayer, MD; Medtronic [place] model [phone] (serial number [id number])    . RADIOLOGY WITH ANESTHESIA N/A 11/16/2017   Procedure: RADIOLOGY WITH ANESTHESIA;  Surgeon: Luanne Bras, MD;  Location: [place];  Service: Radiology;  Laterality: N/A;  . TEMPORARY PACEMAKER INSERTION N/A 07/18/2014   Procedure: TEMPORARY PACEMAKER INSERTION;  Surgeon: Peter M Martinique, MD;  Location: [facility] CATH LAB;  Service: Cardiovascular;  Laterality: N/A;    Family History  Problem Relation Age of Onset  . Pneumonia Mother   . Hip fracture Mother   . Cancer Father        splenic  . Heart disease Sister        Pacer placed  . Cancer Brother        throat and lung  . Cancer Brother        prostate, age 86  . Prostate cancer Brother   .  Heart disease Son   . Stroke Neg Hx   . Colon cancer Neg Hx     Social History:  reports that he quit smoking about 65 years ago. He has a 15.00 pack-year smoking history. His smokeless tobacco use includes chew. He reports that he does not drink alcohol or use drugs.  Allergies:  Allergies  Allergen Reactions  . Warfarin And Related Other (See Comments)    Lost a lot of weight and also experienced numbness  . Atorvastatin Other (See Comments)    REACTION: MUSCLE PAIN  .  Ezetimibe Other (See Comments)    REACTION: MUSCLE PAIN  . Diazepam Other (See Comments)    UNKNOWN, per pt  . Penicillins Other (See Comments)    UNKNOWN, per pt    Medications: I have reviewed the patient's current medications.  Results for orders placed or performed during the hospital encounter of 11/25/18 (from the past 48 hour(s))  Basic metabolic panel     Status: Abnormal   Collection Time: 11/25/18  1:18 PM  Result Value Ref Range   Sodium 139 135 - 145 mmol/L   Potassium 4.1 3.5 - 5.1 mmol/L   Chloride 106 98 - 111 mmol/L   CO2 23 22 - 32 mmol/L   Glucose, Bld 131 (H) 70 - 99 mg/dL   BUN 20 8 - 23 mg/dL   Creatinine, Ser 1.11 0.61 - 1.24 mg/dL   Calcium 9.5 8.9 - 10.3 mg/dL   GFR calc non Af Amer 59 (L) >60 mL/min   GFR calc Af Amer >60 >60 mL/min   Anion gap 10 5 - 15    Comment: Performed at [facility] [address], [place], [place] [address]  CBC WITH DIFFERENTIAL     Status: Abnormal   Collection Time: 11/25/18  1:18 PM  Result Value Ref Range   WBC 16.5 (H) 4.0 - 10.5 K/uL   RBC 5.19 4.22 - 5.81 MIL/uL   Hemoglobin 15.0 13.0 - 17.0 g/dL   HCT 45.1 39.0 - 52.0 %   MCV 86.9 80.0 - 100.0 fL   MCH 28.9 26.0 - 34.0 pg   MCHC 33.3 30.0 - 36.0 g/dL   RDW 13.7 11.5 - 15.5 %   Platelets 202 150 - 400 K/uL   nRBC 0.0 0.0 - 0.2 %   Neutrophils Relative % 76 %   Neutro Abs 12.5 (H) 1.7 - 7.7 K/uL   Lymphocytes Relative 12 %   Lymphs Abs 1.9 0.7 - 4.0 K/uL   Monocytes Relative 10 %   Monocytes Absolute 1.7 (H) 0.1 - 1.0 K/uL   Eosinophils Relative 1 %   Eosinophils Absolute 0.2 0.0 - 0.5 K/uL   Basophils Relative 1 %   Basophils Absolute 0.1 0.0 - 0.1 K/uL   Immature Granulocytes 0 %   Abs Immature Granulocytes 0.07 0.00 - 0.07 K/uL    Comment: Performed at [facility] [address], [place], [place] [address]  Protime-INR     Status: Abnormal   Collection Time: 11/25/18  1:18 PM  Result Value Ref Range   Prothrombin Time 15.8 (H) 11.4 -  15.2 seconds   INR 1.3 (H) 0.8 - 1.2    Comment: (NOTE) INR goal varies based on device and disease states. Performed at [facility], [place] [address], [place], [place] [address]   Type and screen [place]     Status: None   Collection Time: 11/25/18  1:18  PM  Result Value Ref Range   ABO/RH(D) A POS    Antibody Screen NEG    Sample Expiration      11/28/2018,2359 Performed at [facility], [place] [address], [place], [place] [address]   SARS Coronavirus 2 (CEPHEID - Performed in [place] hospital lab), Hosp Order     Status: None   Collection Time: 11/25/18  1:57 PM   Specimen: Nasopharyngeal Swab  Result Value Ref Range   SARS Coronavirus 2 NEGATIVE NEGATIVE    Comment: (NOTE) If result is NEGATIVE SARS-CoV-2 target nucleic acids are NOT DETECTED. The SARS-CoV-2 RNA is generally detectable in upper and lower  respiratory specimens during the acute phase of infection. The lowest  concentration of SARS-CoV-2 viral copies this assay can detect is 250  copies / mL. A negative result does not preclude SARS-CoV-2 infection  and should not be used as the sole basis for treatment or other  patient management decisions.  A negative result may occur with  improper specimen collection / handling, submission of specimen other  than nasopharyngeal swab, presence of viral mutation(s) within the  areas targeted by this assay, and inadequate number of viral copies  (<250 copies / mL). A negative result must be combined with clinical  observations, patient history, and epidemiological information. If result is POSITIVE SARS-CoV-2 target nucleic acids are DETECTED. The SARS-CoV-2 RNA is generally detectable in upper and lower  respiratory specimens dur ing the acute phase of infection.  Positive  results are indicative of active infection with SARS-CoV-2.  Clinical  correlation with patient history and other diagnostic information is  necessary to determine  patient infection status.  Positive results do  not rule out bacterial infection or co-infection with other viruses. If result is PRESUMPTIVE POSTIVE SARS-CoV-2 nucleic acids MAY BE PRESENT.   A presumptive positive result was obtained on the submitted specimen  and confirmed on repeat testing.  While 2019 novel coronavirus  (SARS-CoV-2) nucleic acids may be present in the submitted sample  additional confirmatory testing may be necessary for epidemiological  and / or clinical management purposes  to differentiate between  SARS-CoV-2 and other Sarbecovirus currently known to infect humans.  If clinically indicated additional testing with an alternate test  methodology [phone]) is advised. The SARS-CoV-2 RNA is generally  detectable in upper and lower respiratory sp ecimens during the acute  phase of infection. The expected result is Negative. Fact Sheet for Patients:  StrictlyIdeas.no Fact Sheet for Healthcare Providers: BankingDealers.co.za This test is not yet approved or cleared by the [place] FDA and has been authorized for detection and/or diagnosis of SARS-CoV-2 by FDA under an Emergency Use Authorization (EUA).  This EUA will remain in effect (meaning this test can be used) for the duration of the COVID-19 declaration under Section 564(b)(1) of the Act, 21 U.S.C. section 360bbb-3(b)(1), unless the authorization is terminated or revoked sooner. Performed at [facility], [place] [address] [place], [place] [address]   Urinalysis, Routine w reflex microscopic     Status: Abnormal   Collection Time: 11/25/18  1:57 PM  Result Value Ref Range   Color, Urine YELLOW YELLOW   APPearance HAZY (A) CLEAR   Specific Gravity, Urine 1.023 1.005 - 1.030   pH 6.0 5.0 - 8.0   Glucose, UA NEGATIVE NEGATIVE mg/dL   Hgb urine dipstick LARGE (A) NEGATIVE   Bilirubin Urine NEGATIVE NEGATIVE   Ketones, ur NEGATIVE NEGATIVE mg/dL    Protein, ur 100 (A)  NEGATIVE mg/dL   Nitrite POSITIVE (A) NEGATIVE   Leukocytes,Ua NEGATIVE NEGATIVE   RBC / HPF >50 (H) 0 - 5 RBC/hpf   WBC, UA 6-10 0 - 5 WBC/hpf   Bacteria, UA MANY (A) NONE SEEN   Squamous Epithelial / LPF 0-5 0 - 5   Mucus PRESENT     Comment: Performed at [facility], [place] [address], [place], [place] [address]  Surgical pcr screen     Status: None   Collection Time: 11/25/18  4:43 PM   Specimen: Nasal Mucosa; Nasal Swab  Result Value Ref Range   MRSA, PCR NEGATIVE NEGATIVE   Staphylococcus aureus NEGATIVE NEGATIVE    Comment: (NOTE) The Xpert SA Assay (FDA approved for NASAL specimens in patients 45 years of age and older), is one component of a comprehensive surveillance program. It is not intended to diagnose infection nor to guide or monitor treatment. Performed at [facility], [place] [address] [place], [place] [address]   CBC     Status: Abnormal   Collection Time: 11/26/18  7:23 AM  Result Value Ref Range   WBC 14.8 (H) 4.0 - 10.5 K/uL   RBC 4.90 4.22 - 5.81 MIL/uL   Hemoglobin 14.2 13.0 - 17.0 g/dL   HCT 42.1 39.0 - 52.0 %   MCV 85.9 80.0 - 100.0 fL   MCH 29.0 26.0 - 34.0 pg   MCHC 33.7 30.0 - 36.0 g/dL   RDW 13.7 11.5 - 15.5 %   Platelets 176 150 - 400 K/uL   nRBC 0.0 0.0 - 0.2 %    Comment: Performed at [facility], [place] [address], [place], [place] [address]  Basic metabolic panel     Status: Abnormal   Collection Time: 11/26/18  7:23 AM  Result Value Ref Range   Sodium 138 135 - 145 mmol/L   Potassium 4.2 3.5 - 5.1 mmol/L   Chloride 106 98 - 111 mmol/L   CO2 25 22 - 32 mmol/L   Glucose, Bld 123 (H) 70 - 99 mg/dL   BUN 19 8 - 23 mg/dL   Creatinine, Ser 1.04 0.61 - 1.24 mg/dL   Calcium 9.1 8.9 - 10.3 mg/dL   GFR calc non Af Amer >60 >60 mL/min   GFR calc Af Amer >60 >60 mL/min   Anion gap 7 5 - 15    Comment: Performed at [facility] [address] [place], [place] [address]    Dg Pelvis Portable  Result  Date: 11/25/2018 CLINICAL DATA:  Right femoral neck fracture. EXAM: PORTABLE PELVIS 1-2 VIEWS COMPARISON:  Radiographs for lumbar spine dated 10/09/2018 and CT scan of the abdomen dated 03/16/2015 FINDINGS: There is an impacted fracture of the right femoral neck. Left hip appears normal. Pelvic bones are normal. IMPRESSION: Impacted fracture of the right femoral neck. Electronically Signed   By: Lorriane Shire M.D.   On: 11/25/2018 13:50   Dg Chest Port 1 View  Result Date: 11/25/2018 CLINICAL DATA:  Right hip fracture. EXAM: PORTABLE CHEST 1 VIEW COMPARISON:  Chest x-rays dated 01/29/2018 and 12/18/2017 FINDINGS: Heart size and vascularity are normal. Lungs are clear. No effusions. Pacemaker in place. No acute bone abnormality. IMPRESSION: No active disease. Electronically Signed   By: Lorriane Shire M.D.   On: 11/25/2018 13:16   Dg Knee Right Port  Result Date: 11/25/2018 CLINICAL DATA:  RIGHT leg injury and pain.  RIGHT hip fracture. EXAM: PORTABLE RIGHT KNEE - 1-2  VIEW COMPARISON:  None. FINDINGS: No acute fracture or dislocation. No joint effusion. Chondrocalcinosis identified. No focal bony lesions are identified. IMPRESSION: No acute bony abnormality. Chondrocalcinosis. Electronically Signed   By: Margarette Canada M.D.   On: 11/25/2018 13:51   Dg Hip Unilat With Pelvis 2-3 Views Right  Result Date: 11/25/2018 CLINICAL DATA:  Pt fell 2 days ago and is having right hip pain. Constant pain but hurts more when trying to stand and bend down. No previous injury or fx. History of back surgery. EXAM: DG HIP (WITH OR WITHOUT PELVIS) 2-3V RIGHT COMPARISON:  CT of the abdomen and pelvis on 03/16/2015 FINDINGS: There is an impacted subcapital femoral neck fracture of the RIGHT hip. No dislocation. Degenerative changes are seen in the LOWER spine. Remote lumbar spine fusion. IMPRESSION: Acute impacted subcapital femoral neck fracture. Electronically Signed   By: Nolon Nations M.D.   On: 11/25/2018 11:57     Review of Systems  Unable to perform ROS: Dementia  Musculoskeletal: Positive for joint pain (Right hip).   Blood pressure (!) 150/82, pulse 68, temperature 97.6 F (36.4 C), temperature source Oral, resp. rate 16, height 5\' 8"  (1.727 m), weight 84.4 kg, SpO2 97 %. Physical Exam  Constitutional: He appears well-developed and well-nourished. No distress.  HENT:  Head: Normocephalic and atraumatic.  Eyes: Conjunctivae are normal. Right eye exhibits no discharge. Left eye exhibits no discharge. No scleral icterus.  Neck: Normal range of motion.  Cardiovascular: Normal rate and regular rhythm.  Respiratory: Effort normal. No respiratory distress.  Musculoskeletal:     Comments: RLE No traumatic wounds, ecchymosis, or rash  Mild TTP hip  No knee or ankle effusion  Knee stable to varus/ valgus and anterior/posterior stress  Sens DPN, SPN, TN intact  Motor EHL, ext, flex, evers grossly intact  DP 1+, PT 1+, No significant edema  Neurological: He is alert.  Skin: Skin is warm and dry. He is not diaphoretic.  Psychiatric: He has a normal mood and affect. His behavior is normal.    Assessment/Plan: Right hip fx -- Plan hemi this afternoon by Dr. Lyla Glassing. Please keep NPO. Multiple medical problems including CVA; afib, not on AC due to GI bleeding; HTN; HLD; dementia; and pacemaker placement -- per primary service.    Lisette Abu, PA-C Orthopedic Surgery [phone] 11/26/2018, 9:48 AM

## 2018-11-26 NOTE — Anesthesia Procedure Notes (Signed)
Procedure Name: Intubation Date/Time: 11/26/2018 12:45 PM Performed by: Harden Mo, CRNA Pre-anesthesia Checklist: Patient identified, Emergency Drugs available, Suction available and Patient being monitored Patient Re-evaluated:Patient Re-evaluated prior to induction Oxygen Delivery Method: Circle System Utilized Preoxygenation: Pre-oxygenation with 100% oxygen Induction Type: IV induction and Rapid sequence Laryngoscope Size: Miller and 2 Grade View: Grade I Tube type: Oral Tube size: 8.0 mm Number of attempts: 1 Airway Equipment and Method: Stylet and Oral airway Placement Confirmation: ETT inserted through vocal cords under direct vision,  positive ETCO2 and breath sounds checked- equal and bilateral Secured at: 23 cm Tube secured with: Tape Dental Injury: Teeth and Oropharynx as per pre-operative assessment

## 2018-11-26 NOTE — Progress Notes (Signed)
Pre-op report called and given to Robby, RN in [employer]. Robby, RN aware that Ancef antibiotic has not been verified by pharmacy so this RN unable to admin on call to OR. All questions answered to satisfaction. Awaiting transportation. Will continue to monitor.

## 2018-11-26 NOTE — Interval H&P Note (Signed)
History and Physical Interval Note:  11/26/2018 12:07 PM  [name]  has presented today for surgery, with the diagnosis of displaced right femur neck fracture.  The various methods of treatment have been discussed with the patient and family. After consideration of risks, benefits and other options for treatment, the patient has consented to  Procedure(s): HEMI HIP ARTHROPLASTY ANTERIOR APPROACH (Right) as a surgical intervention.  The patient's history has been reviewed, patient examined, no change in status, stable for surgery.  I have reviewed the patient's chart and labs.  Questions were answered to the patient's satisfaction.    Discussed surgery with patient's daughter due to dementia.  The risks, benefits, and alternatives were discussed with the patient/daughter. There are risks associated with the surgery including, but not limited to, problems with anesthesia (death), infection, instability (giving out of the joint), dislocation, differences in leg length/angulation/rotation, fracture of bones, loosening or failure of implants, hematoma (blood accumulation) which may require surgical drainage, blood clots, pulmonary embolism, nerve injury (foot drop and lateral thigh numbness), and blood vessel injury. The patient/daughter understand these risks and elect to proceed.    Hilton Cork Monti Villers

## 2018-11-27 LAB — BASIC METABOLIC PANEL
Anion gap: 10 (ref 5–15)
BUN: 29 mg/dL — ABNORMAL HIGH (ref 8–23)
CO2: 24 mmol/L (ref 22–32)
Calcium: 8.9 mg/dL (ref 8.9–10.3)
Chloride: 105 mmol/L (ref 98–111)
Creatinine, Ser: 1.14 mg/dL (ref 0.61–1.24)
GFR calc Af Amer: >60 mL/min (ref >60)
GFR calc non Af Amer: 58 mL/min — ABNORMAL LOW (ref >60)
Glucose, Bld: 130 mg/dL — ABNORMAL HIGH (ref 70–99)
Potassium: 4.5 mmol/L (ref 3.5–5.1)
Sodium: 139 mmol/L (ref 135–145)

## 2018-11-27 LAB — CBC
HCT: 40.8 % (ref 39.0–52.0)
Hemoglobin: 13.2 g/dL (ref 13.0–17.0)
MCH: 28.4 pg (ref 26.0–34.0)
MCHC: 32.4 g/dL (ref 30.0–36.0)
MCV: 87.9 fL (ref 80.0–100.0)
Platelets: 202 10*3/uL (ref 150–400)
RBC: 4.64 MIL/uL (ref 4.22–5.81)
RDW: 13.7 % (ref 11.5–15.5)
WBC: 15.9 10*3/uL — ABNORMAL HIGH (ref 4.0–10.5)
nRBC: 0 % (ref 0.0–0.2)

## 2018-11-27 NOTE — Anesthesia Postprocedure Evaluation (Signed)
Anesthesia Post Note  Patient: Dakota [name]  Procedure(s) Performed: HEMI HIP ARTHROPLASTY ANTERIOR APPROACH (Right Hip)     Patient location during evaluation: PACU Anesthesia Type: General Level of consciousness: awake and alert Pain management: pain level controlled Vital Signs Assessment: post-procedure vital signs reviewed and stable Respiratory status: spontaneous breathing, nonlabored ventilation, respiratory function stable and patient connected to nasal cannula oxygen Cardiovascular status: blood pressure returned to baseline and stable Postop Assessment: no apparent nausea or vomiting Anesthetic complications: no    Last Vitals:  Vitals:   11/27/18 0434 11/27/18 0836  BP: 125/83 122/77  Pulse: 79 93  Resp: 16 16  Temp: 36.5 C 36.5 C  SpO2: 94% 100%    Last Pain:  Vitals:   11/27/18 0836  TempSrc: Oral  PainSc:                  [name]

## 2018-11-27 NOTE — TOC Initial Note (Signed)
Transition of Care [facility]) - Initial/Assessment Note    Patient Details  Name: Dakota [name] MRN: [id number] Date of Birth: [birth year]  Transition of Care [facility]) CM/SW Contact:    Bartholomew Crews, RN Phone Number: [phone] 11/27/2018, 3:33 PM  Clinical Narrative:                 Spoke with daughter, Dakota [name], on the phone - [phone]. Family wanting patient to return home to his home. His spouse passed away in 10-05-2022 and he forgets that she is not there. [name] has 3 brothers who assist her with providing care. Discussed Home First program through [facility] was interested in more information. Referral placed to [facility] who will follow up. All needed DME equipment in the home, except hoyer lift. Dakota agreed that this could be beneficial given patient's current max assist status. DME order placed and referral made to [employer]. Patient will need Home Health orders for RN, PT, OT with Face to Face. Anticipate that PTAR will be needed for transport home. CM to follow for transition of care needs.   Expected Discharge Plan: [place]     Patient Goals and CMS Choice Patient states their goals for this hospitalization and ongoing recovery are:: family wants patient to return home CMS Medicare.gov Compare Post Acute Care list provided to:: Patient Represenative (must comment)(Dakota [name] - daughter) Choice offered to / list presented to : Adult Children  Expected Discharge Plan and Services Expected Discharge Plan: [place] In-house Referral: [facility], Clinical Social Work Discharge Planning Services: CM Consult Post Acute Care Choice: Home Health, Durable Medical Equipment(hoyer lift and [place]) Living arrangements for the past 2 months: [place]                 DME Arranged: Other see comment(hoyer lift) DME Agency: [facility] Date DME Agency Contacted: 11/27/18 Time DME Agency Contacted: 33 Representative spoke with at DME Agency:  zach HH Arranged: RN, PT, OT [place] Agency: [place] Date [place]: 11/27/18 Time [place]: 1532 Representative spoke with at [place]: cory  Prior Living Arrangements/Services Living arrangements for the past 2 months: [place] with:: Self Patient language and need for interpreter reviewed:: Yes        Need for Family Participation in Patient Care: Yes (Comment) Care giver support system in place?: Yes (comment) Current home services: DME Criminal Activity/Legal Involvement Pertinent to Current Situation/Hospitalization: No - Comment as needed  Activities of Daily Living Home Assistive Devices/Equipment: Walker (specify type) ADL Screening (condition at time of admission) Patient's cognitive ability adequate to safely complete daily activities?: No Is the patient deaf or have difficulty hearing?: Yes Does the patient have difficulty seeing, even when wearing glasses/contacts?: No Does the patient have difficulty concentrating, remembering, or making decisions?: Yes Patient able to express need for assistance with ADLs?: No Does the patient have difficulty dressing or bathing?: Yes Independently performs ADLs?: No Communication: Needs assistance Does the patient have difficulty walking or climbing stairs?: Yes Weakness of Legs: Right Weakness of Arms/Hands: None  Permission Sought/Granted                  Emotional Assessment Appearance:: Appears stated age Attitude/Demeanor/Rapport: Engaged Affect (typically observed): Restless, Accepting Orientation: : Oriented to Self, Oriented to Place, Oriented to Situation Alcohol / Substance Use: Not Applicable Psych Involvement: No (comment)  Admission diagnosis:  Closed displaced fracture of  right femoral neck ([place]) [S72.001A] Patient Active Problem List   Diagnosis Date Noted  . Hip fracture ([place]) 11/25/2018  . Dementia with behavioral disturbance ([place]) 11/25/2018  .  Dysuria 10/11/2018  . Low back pain 10/11/2018  . At risk for falls 10/11/2018  . Rash 05/30/2018  . Insomnia 01/11/2018  . Urinary frequency   . Dysphagia due to recent stroke   . Sleep disturbance   . Constipation   . History of GI bleed   . Hypotension 11/24/2017  . Prediabetes   . Subacute delirium 11/21/2017  . Chewing tobacco nicotine dependence 11/21/2017  . Acute ischemic right MCA stroke ([place]) 11/21/2017  . Cerebrovascular accident (CVA) due to thrombosis of right middle cerebral artery ([place])   . Acute deep vein thrombosis (DVT) of proximal vein of lower extremity ([name])   . Atrial fibrillation with slow ventricular response ([place])   . NSTEMI (non-ST elevated myocardial infarction) ([place])   . CHB (complete heart block) - s/p MDT 1 lead PPM 07/17/2014  . Advance care planning 07/03/2014  . Medicare annual wellness visit, subsequent 10/28/2012  . RIB PAIN, RIGHT SIDED 01/02/2009  . CAD, NATIVE VESSEL 11/25/2008  . Unspecified vitamin D deficiency 11/24/2008  . RECENT RET DETACH PARTIAL W/SINGLE DEFECT 11/24/2008  . Essential hypertension, benign 07/21/2008  . HLD (hyperlipidemia) 05/22/2007  . GERD 05/22/2007  . Hyperglycemia 05/22/2007   PCP:  Tonia Ghent, MD Pharmacy:   [facility]), [place] - [address] [name] [address] Phone: [phone] Fax: [phone]     Social Determinants of Health (SDOH) Interventions    Readmission Risk Interventions No flowsheet data found.

## 2018-11-27 NOTE — Telephone Encounter (Signed)
Late entry.  I called his daughter back last night.  I hate to hear that he had this trouble in the first place.  She is staying with him in the hospital.  I appreciate the help of all involved.  She thanked me for the call. I will await the inpatient notes.

## 2018-11-27 NOTE — Progress Notes (Signed)
[facility] PROGRESS NOTE  ARREN LAMINACK GDJ:[id number] DOB: [birth year] DOA: 11/25/2018 PCP: Tonia Ghent, MD  Brief summary   85 y.o. male with medical history significant of CVA; afib, not on AC due to GI bleeding; HTN; HLD; dementia; and pacemaker placement presenting with hip pain after a fall.  His sister called his daughter yesterday to report a fall Friday night about 1015.  Mechanical fall, he required help getting up.  He was able to get up in a chair yesterday and went to bed last night and was complaining of pain after getting up.  He took a couple of steps yesterday but unable to walk at all today.  Until this week, he was independently ambulatory.  He feeds himself, but it needs to be soft/pureed. He still knows family.   ED Course:  R femoral neck fracture after witnessed fall Friday night.  Was taken to Urgent Care and sent here.  for surgery by Dr. Doreatha Martin    Assessment/Plan:  R hip fracture. Mechanical fall resulting in hip fracture. - Underwent Right hip hemiarthroplasty, anterior approach on 7/27. Cont Lovenox post-operatively. Cont pain control with Robxain, Vicodin, and Morphine prn. Obtain PT consult.   Afib. PPM. Rate controlled with pacemaker. Not on AC due to h/o GI bleeding, may need BB if tachy  Dementia. Delirium post op. Improved. Cont telesitter. Avoid benadryl, excessive opioids. Started on low dose seroquel. Family is willing to come sit with him if needed  HTN. Not on meds at home. Monitor   Tobacco dependence. Tobacco Dependence: encourage cessation.  Patch ordered at patient request.  Leukocytosis, probable stress related. CXR: no clear infiltrates. COVID-negative. Recently treated with antibiotics as outpatient. -pend urine cultures.    Code Status: DNR Family Communication: d/w patient, RN. Updated His family  (indicate person spoken with, relationship, and if by phone, the number) Disposition Plan: remains inpatient     Consultants:  ortho  Procedures:   hip surgery   Antibiotics: Anti-infectives (From admission, onward)   Start     Dose/Rate Route Frequency Ordered Stop   11/26/18 1800  ceFAZolin (ANCEF) IVPB 2g/100 mL premix     2 g 200 mL/hr over 30 Minutes Intravenous Every 6 hours 11/26/18 1600 11/27/18 0014   11/26/18 1100  ceFAZolin (ANCEF) 2-4 GM/100ML-% IVPB    Note to Pharmacy: Laurita Quint   : cabinet override      11/26/18 1100 11/26/18 1245   11/26/18 1045  ceFAZolin (ANCEF) IVPB 2g/100 mL premix     2 g 200 mL/hr over 30 Minutes Intravenous On call to O.R. 11/26/18 1034 11/26/18 1255       (indicate start date, and stop date if known)  HPI/Subjective: Less confused today. No acute agitations. Family is not very interested in SNF Objective: Vitals:   11/27/18 0434 11/27/18 0836  BP: 125/83 122/77  Pulse: 79 93  Resp: 16 16  Temp: 97.7 F (36.5 C) 97.7 F (36.5 C)  SpO2: 94% 100%    Intake/Output Summary (Last 24 hours) at 11/27/2018 1130 Last data filed at 11/27/2018 0900 Gross per 24 hour  Intake 1710 ml  Output 750 ml  Net 960 ml   Filed Weights   11/25/18 1344  Weight: 84.4 kg    Exam:   General:  No distress   Cardiovascular: s1,s2 rrr  Respiratory: cta bl  Abdomen: soft, nt   Musculoskeletal: no leg edema    Data Reviewed: Basic Metabolic Panel: Recent Labs  Lab 11/25/18 1318  11/26/18 0723 11/27/18 0425  NA 139 138 139  K 4.1 4.2 4.5  CL 106 106 105  CO2 23 25 24   GLUCOSE 131* 123* 130*  BUN 20 19 29*  CREATININE 1.11 1.04 1.14  CALCIUM 9.5 9.1 8.9   Liver Function Tests: No results for input(s): AST, ALT, ALKPHOS, BILITOT, PROT, ALBUMIN in the last 168 hours. No results for input(s): LIPASE, AMYLASE in the last 168 hours. No results for input(s): AMMONIA in the last 168 hours. CBC: Recent Labs  Lab 11/25/18 1318 11/26/18 0723 11/27/18 0425  WBC 16.5* 14.8* 15.9*  NEUTROABS 12.5*  --   --   HGB 15.0 14.2 13.2  HCT  45.1 42.1 40.8  MCV 86.9 85.9 87.9  PLT 202 176 202   Cardiac Enzymes: No results for input(s): CKTOTAL, CKMB, CKMBINDEX, TROPONINI in the last 168 hours. BNP (last 3 results) No results for input(s): BNP in the last 8760 hours.  ProBNP (last 3 results) No results for input(s): PROBNP in the last 8760 hours.  CBG: No results for input(s): GLUCAP in the last 168 hours.  Recent Results (from the past 240 hour(s))  Novel Coronavirus, NAA ([facility])  Drive up testing site only     Status: None   Collection Time: 11/19/18  8:40 AM  Result Value Ref Range Status   SARS-CoV-2, NAA Not Detected Not Detected Final    Comment: This test was developed and its performance characteristics determined by [employer]. This test has not been FDA cleared or approved. This test has been authorized by FDA under an Emergency Use Authorization (EUA). This test is only authorized for the duration of time the declaration that circumstances exist justifying the authorization of the emergency use of in vitro diagnostic tests for detection of SARS-CoV-2 virus and/or diagnosis of COVID-19 infection under section 564(b)(1) of the Act, 21 U.S.C. 664QIH-4(V)(4), unless the authorization is terminated or revoked sooner. When diagnostic testing is negative, the possibility of a false negative result should be considered in the context of a patient's recent exposures and the presence of clinical signs and symptoms consistent with COVID-19. An individual without symptoms of COVID-19 and who is not shedding SARS-CoV-2 virus would expect to have a negative (not detected) result in this assay.   SARS Coronavirus 2 (CEPHEID - Performed in [place] hospital lab), Hosp Order     Status: None   Collection Time: 11/25/18  1:57 PM   Specimen: Nasopharyngeal Swab  Result Value Ref Range Status   SARS Coronavirus 2 NEGATIVE NEGATIVE Final    Comment: (NOTE) If result is NEGATIVE SARS-CoV-2 target nucleic  acids are NOT DETECTED. The SARS-CoV-2 RNA is generally detectable in upper and lower  respiratory specimens during the acute phase of infection. The lowest  concentration of SARS-CoV-2 viral copies this assay can detect is 250  copies / mL. A negative result does not preclude SARS-CoV-2 infection  and should not be used as the sole basis for treatment or other  patient management decisions.  A negative result may occur with  improper specimen collection / handling, submission of specimen other  than nasopharyngeal swab, presence of viral mutation(s) within the  areas targeted by this assay, and inadequate number of viral copies  (<250 copies / mL). A negative result must be combined with clinical  observations, patient history, and epidemiological information. If result is POSITIVE SARS-CoV-2 target nucleic acids are DETECTED. The SARS-CoV-2 RNA is generally detectable in upper and lower  respiratory specimens dur ing  the acute phase of infection.  Positive  results are indicative of active infection with SARS-CoV-2.  Clinical  correlation with patient history and other diagnostic information is  necessary to determine patient infection status.  Positive results do  not rule out bacterial infection or co-infection with other viruses. If result is PRESUMPTIVE POSTIVE SARS-CoV-2 nucleic acids MAY BE PRESENT.   A presumptive positive result was obtained on the submitted specimen  and confirmed on repeat testing.  While 2019 novel coronavirus  (SARS-CoV-2) nucleic acids may be present in the submitted sample  additional confirmatory testing may be necessary for epidemiological  and / or clinical management purposes  to differentiate between  SARS-CoV-2 and other Sarbecovirus currently known to infect humans.  If clinically indicated additional testing with an alternate test  methodology [phone]) is advised. The SARS-CoV-2 RNA is generally  detectable in upper and lower respiratory  sp ecimens during the acute  phase of infection. The expected result is Negative. Fact Sheet for Patients:  StrictlyIdeas.no Fact Sheet for Healthcare Providers: BankingDealers.co.za This test is not yet approved or cleared by the [place] FDA and has been authorized for detection and/or diagnosis of SARS-CoV-2 by FDA under an Emergency Use Authorization (EUA).  This EUA will remain in effect (meaning this test can be used) for the duration of the COVID-19 declaration under Section 564(b)(1) of the Act, 21 U.S.C. section 360bbb-3(b)(1), unless the authorization is terminated or revoked sooner. Performed at [facility], [place] [address], [place], [place] [address]   Surgical pcr screen     Status: None   Collection Time: 11/25/18  4:43 PM   Specimen: Nasal Mucosa; Nasal Swab  Result Value Ref Range Status   MRSA, PCR NEGATIVE NEGATIVE Final   Staphylococcus aureus NEGATIVE NEGATIVE Final    Comment: (NOTE) The Xpert SA Assay (FDA approved for NASAL specimens in patients 83 years of age and older), is one component of a comprehensive surveillance program. It is not intended to diagnose infection nor to guide or monitor treatment. Performed at [facility], [place] [address], [place], [place] [address]   Urine Culture     Status: None (Preliminary result)   Collection Time: 11/25/18  7:38 PM   Specimen: Urine, Clean Catch  Result Value Ref Range Status   Specimen Description URINE, CLEAN CATCH  Final   Special Requests NONE  Final   Culture   Final    CULTURE REINCUBATED FOR BETTER GROWTH Performed at [facility], [place] [address], [place], [place] [address]    Report Status PENDING  Incomplete     Studies: Dg Pelvis Portable  Result Date: 11/26/2018 CLINICAL DATA:  Displaced fracture of right femoral neck post operation. Hx of HTN. EXAM: PORTABLE PELVIS 1-2 VIEWS COMPARISON:  None. FINDINGS: RIGHT hip  arthroplasty hardware appears appropriately positioned. Expected postsurgical changes within the overlying soft tissues. IMPRESSION: Status post RIGHT hip arthroplasty. No evidence of surgical complicating feature. Electronically Signed   By: Franki Cabot M.D.   On: 11/26/2018 17:34   Dg Pelvis Portable  Result Date: 11/25/2018 CLINICAL DATA:  Right femoral neck fracture. EXAM: PORTABLE PELVIS 1-2 VIEWS COMPARISON:  Radiographs for lumbar spine dated 10/09/2018 and CT scan of the abdomen dated 03/16/2015 FINDINGS: There is an impacted fracture of the right femoral neck. Left hip appears normal. Pelvic bones are normal. IMPRESSION: Impacted fracture of the right femoral neck. Electronically Signed   By: Lorriane Shire M.D.   On: 11/25/2018  13:50   Dg Chest Port 1 View  Result Date: 11/25/2018 CLINICAL DATA:  Right hip fracture. EXAM: PORTABLE CHEST 1 VIEW COMPARISON:  Chest x-rays dated 01/29/2018 and 12/18/2017 FINDINGS: Heart size and vascularity are normal. Lungs are clear. No effusions. Pacemaker in place. No acute bone abnormality. IMPRESSION: No active disease. Electronically Signed   By: Lorriane Shire M.D.   On: 11/25/2018 13:16   Dg Knee Right Port  Result Date: 11/25/2018 CLINICAL DATA:  RIGHT leg injury and pain.  RIGHT hip fracture. EXAM: PORTABLE RIGHT KNEE - 1-2 VIEW COMPARISON:  None. FINDINGS: No acute fracture or dislocation. No joint effusion. Chondrocalcinosis identified. No focal bony lesions are identified. IMPRESSION: No acute bony abnormality. Chondrocalcinosis. Electronically Signed   By: Margarette Canada M.D.   On: 11/25/2018 13:51   Dg C-arm 1-60 Min  Result Date: 11/26/2018 CLINICAL DATA:  Arthroplasty anterior approach. EXAM: OPERATIVE RIGHT HIP (WITH PELVIS IF PERFORMED) 2 VIEWS TECHNIQUE: Fluoroscopic spot image(s) were submitted for interpretation post-operatively. COMPARISON:  None. FINDINGS: Intraoperative fluoroscopic images demonstrating a RIGHT hip arthroplasty.  Hardware appears appropriately positioned. Osseous alignment appears anatomic. Fluoroscopy provided for 12 seconds. IMPRESSION: Intraoperative fluoroscopic images demonstrating a RIGHT hip arthroplasty. Hardware appears appropriately positioned. No evidence of surgical complicating feature. Electronically Signed   By: Franki Cabot M.D.   On: 11/26/2018 16:18   Dg Hip Operative Unilat W Or W/o Pelvis Right  Result Date: 11/26/2018 CLINICAL DATA:  Arthroplasty anterior approach. EXAM: OPERATIVE RIGHT HIP (WITH PELVIS IF PERFORMED) 2 VIEWS TECHNIQUE: Fluoroscopic spot image(s) were submitted for interpretation post-operatively. COMPARISON:  None. FINDINGS: Intraoperative fluoroscopic images demonstrating a RIGHT hip arthroplasty. Hardware appears appropriately positioned. Osseous alignment appears anatomic. Fluoroscopy provided for 12 seconds. IMPRESSION: Intraoperative fluoroscopic images demonstrating a RIGHT hip arthroplasty. Hardware appears appropriately positioned. No evidence of surgical complicating feature. Electronically Signed   By: Franki Cabot M.D.   On: 11/26/2018 16:18   Dg Hip Unilat With Pelvis 2-3 Views Right  Result Date: 11/25/2018 CLINICAL DATA:  Pt fell 2 days ago and is having right hip pain. Constant pain but hurts more when trying to stand and bend down. No previous injury or fx. History of back surgery. EXAM: DG HIP (WITH OR WITHOUT PELVIS) 2-3V RIGHT COMPARISON:  CT of the abdomen and pelvis on 03/16/2015 FINDINGS: There is an impacted subcapital femoral neck fracture of the RIGHT hip. No dislocation. Degenerative changes are seen in the LOWER spine. Remote lumbar spine fusion. IMPRESSION: Acute impacted subcapital femoral neck fracture. Electronically Signed   By: Nolon Nations M.D.   On: 11/25/2018 11:57    Scheduled Meds: . aspirin EC  81 mg Oral Daily  . docusate sodium  100 mg Oral BID  . enoxaparin (LOVENOX) injection  40 mg Subcutaneous Q24H  . feeding supplement  (ENSURE ENLIVE)  237 mL Oral BID BM  . nicotine  14 mg Transdermal Daily  . pantoprazole  40 mg Oral Daily  . QUEtiapine  25 mg Oral QHS  . tamsulosin  0.4 mg Oral Daily   Continuous Infusions: . methocarbamol (ROBAXIN) IV      Principal Problem:   Hip fracture ([name]) Active Problems:   Essential hypertension, benign   Hyperglycemia   Atrial fibrillation with slow ventricular response ([name])   Chewing tobacco nicotine dependence   Dementia with behavioral disturbance ([place])    Time spent: >25 minutes     Kinnie Feil  [facility] Pager [phone]. If 7PM-7AM, please contact night-coverage at [email or web address],  password [email or web address] 11/27/2018, 11:30 AM  LOS: 2 days

## 2018-11-27 NOTE — Evaluation (Signed)
Physical Therapy Evaluation Patient Details Name: Dakota [name] MRN: [id number] DOB: 08-26-1928 Today's Date: 11/27/2018   History of Present Illness  Pt is an 85 y.o. male admitted 11/25/18 after fall sustaining R femoral neck fx. S/p R hip hemiarthroplasty (anterior approach) 7/27. PMH includes dementia, HTN, afib, pacemaker, CVA.    Clinical Impression  Pt presents with an overall decrease in functional mobility secondary to above. PTA, pt lives alone with 24/7 caregivers, ambulatory with RW, assist for ADLs and transfers. Today, pt required maxA to take pivotal steps to recliner. Pt limited by RLE pain and residual L-side weakness from previous stroke. Daughter present to discuss d/c planning; family declining SNF, plans to have pt return home with increased physical assist as needed; they own necessary DME. Will follow acutely to address established goals.    Follow Up Recommendations SNF;Supervision/Assistance - 24 hour(family declined SNF, will need max HH services)    Equipment Recommendations  None recommended by PT    Recommendations for Other Services       Precautions / Restrictions Precautions Precautions: Fall Restrictions Weight Bearing Restrictions: Yes RLE Weight Bearing: Weight bearing as tolerated      Mobility  Bed Mobility Overal bed mobility: Needs Assistance Bed Mobility: Supine to Sit     Supine to sit: Max assist     General bed mobility comments: Able to initiate movement to EOB well, modA to manage BLEs  Transfers Overall transfer level: Needs assistance Equipment used: Rolling walker (2 wheeled) Transfers: Sit to/from Stand Sit to Stand: Mod assist;From elevated surface         General transfer comment: Significant L-lateral lean, assist to scoot foward and maintain balance with trunk elevation; assist to bring LUE onto RW  Ambulation/Gait Ambulation/Gait assistance: Max assist;+2 safety/equipment Gait Distance (Feet): 2 Feet Assistive  device: Rolling walker (2 wheeled) Gait Pattern/deviations: Step-to pattern;Leaning posteriorly;Trunk flexed;Staggering left Gait velocity: Decreased   General Gait Details: Pivotal steps forwards and to recliner with RW, maxA to maintain upright posture as pt with significant L-lateral and posterior lean. Very antalgic steps with RLE buckling. Assist+2 from daughter for IV pole/RW  Stairs            Wheelchair Mobility    Modified Rankin (Stroke Patients Only)       Balance Overall balance assessment: Needs assistance   Sitting balance-Leahy Scale: Poor   Postural control: Posterior lean;Left lateral lean   Standing balance-Leahy Scale: Zero Standing balance comment: Reliant on UE support and maxA                             Pertinent Vitals/Pain Pain Assessment: Faces Faces Pain Scale: Hurts little more Pain Location: R hip Pain Descriptors / Indicators: Guarding;Grimacing Pain Intervention(s): Monitored during session;Repositioned    Home Living Family/patient expects to be discharged to:: Private residence Living Arrangements: Alone(with care giver) Available Help at Discharge: Personal care attendant;Available 24 hours/day;Family Type of Home: House Home Access: Ramped entrance     Home Layout: One level Home Equipment: [place] - 2 wheels;Bedside commode;Wheelchair - manual;Shower seat;Hand held shower head;Hospital bed;Other (comment) Additional Comments: Lift chair recliner    Prior Function Level of Independence: Needs assistance   Gait / Transfers Assistance Needed: Ambulatory with RW without assist, caregiver providing 24/7 supervision. Intermittent assist from caregiver to get out of bed/stand. Typically sleeps in regular bed, but has lift recliner and hospital bed  ADL's / [place] Needed: Caregiver assists  with bathing, dressing, pericare; ADL set-up; assist to get into tub and sit on chair  Comments: Enjoys riding on gator  while his son drives and looking at his garden; used to be a Games developer and farmer     Journalist, newspaper        Extremity/Trunk Assessment   Upper Extremity Assessment Upper Extremity Assessment: LUE deficits/detail;Difficult to assess due to impaired cognition LUE Deficits / Details: residual weakness from prior CVA LUE Coordination: decreased gross motor;decreased fine motor    Lower Extremity Assessment Lower Extremity Assessment: RLE deficits/detail;LLE deficits/detail;Difficult to assess due to impaired cognition RLE Deficits / Details: s/p R hip hemiarthroplasty; functionally <3/5 throughout RLE Coordination: decreased gross motor;decreased fine motor LLE Deficits / Details: residual weakness from prior CVA LLE Coordination: decreased gross motor;decreased fine motor       Communication   Communication: HOH  Cognition Arousal/Alertness: Awake/alert Behavior During Therapy: WFL for tasks assessed/performed Overall Cognitive Status: History of cognitive impairments - at baseline                                 General Comments: H/o dementia. Following majority of simple one-step commands; frequent cues to engage L-side. Answering simple questions appropriately with one-word answers      General Comments General comments (skin integrity, edema, etc.): Daughter Dakota [name]) present throughout and supportive; discussed family's plan to have pt return home with increased assist as needed, family hesitant to have him d/c to SNF without visitors based on current cognitive status    Exercises     Assessment/Plan    PT Assessment Patient needs continued PT services  PT Problem List Decreased strength;Decreased range of motion;Decreased activity tolerance;Decreased balance;Decreased mobility;Decreased cognition;Decreased knowledge of use of DME;Decreased knowledge of precautions;Pain       PT Treatment Interventions DME instruction;Gait training;Functional mobility  training;Therapeutic activities;Therapeutic exercise;Balance training;Patient/family education;Wheelchair mobility training    PT Goals (Current goals can be found in the Care Plan section)  Acute Rehab PT Goals Patient Stated Goal: Return home with increased assist PT Goal Formulation: With family Time For Goal Achievement: 12/11/18 Potential to Achieve Goals: Fair    Frequency Min 5X/week   Barriers to discharge        Co-evaluation               AM-PAC PT "6 Clicks" Mobility  Outcome Measure Help needed turning from your back to your side while in a flat bed without using bedrails?: A Lot Help needed moving from lying on your back to sitting on the side of a flat bed without using bedrails?: A Lot Help needed moving to and from a bed to a chair (including a wheelchair)?: A Lot Help needed standing up from a chair using your arms (e.g., wheelchair or bedside chair)?: A Lot Help needed to walk in hospital room?: A Lot Help needed climbing 3-5 steps with a railing? : Total 6 Click Score: 11    End of Session Equipment Utilized During Treatment: Gait belt Activity Tolerance: Patient tolerated treatment well;Patient limited by pain Patient left: in chair;with call bell/phone within reach;with chair alarm set;with family/visitor present;with nursing/sitter in room Nurse Communication: Mobility status PT Visit Diagnosis: Other abnormalities of gait and mobility (R26.89);Muscle weakness (generalized) (M62.81)    Time: 9528-4132 PT Time Calculation (min) (ACUTE ONLY): 32 min   Charges:   PT Evaluation $PT Eval Moderate Complexity: 1 Mod PT Treatments $Therapeutic Activity: 8-22 mins  Mabeline Caras, PT, DPT Acute Rehabilitation Services  Pager [phone] Office [place] 11/27/2018, 1:25 PM

## 2018-11-28 ENCOUNTER — Encounter (HOSPITAL_COMMUNITY): Payer: Self-pay | Admitting: Orthopedic Surgery

## 2018-11-28 LAB — BASIC METABOLIC PANEL
Anion gap: 9 (ref 5–15)
BUN: 31 mg/dL — ABNORMAL HIGH (ref 8–23)
CO2: 25 mmol/L (ref 22–32)
Calcium: 8.5 mg/dL — ABNORMAL LOW (ref 8.9–10.3)
Chloride: 103 mmol/L (ref 98–111)
Creatinine, Ser: 1.03 mg/dL (ref 0.61–1.24)
GFR calc Af Amer: >60 mL/min (ref >60)
GFR calc non Af Amer: >60 mL/min (ref >60)
Glucose, Bld: 119 mg/dL — ABNORMAL HIGH (ref 70–99)
Potassium: 4.1 mmol/L (ref 3.5–5.1)
Sodium: 137 mmol/L (ref 135–145)

## 2018-11-28 LAB — URINE CULTURE: Culture: >=100000 — AB

## 2018-11-28 LAB — CBC
HCT: 36 % — ABNORMAL LOW (ref 39.0–52.0)
Hemoglobin: 12 g/dL — ABNORMAL LOW (ref 13.0–17.0)
MCH: 28.8 pg (ref 26.0–34.0)
MCHC: 33.3 g/dL (ref 30.0–36.0)
MCV: 86.5 fL (ref 80.0–100.0)
Platelets: 188 10*3/uL (ref 150–400)
RBC: 4.16 MIL/uL — ABNORMAL LOW (ref 4.22–5.81)
RDW: 13.8 % (ref 11.5–15.5)
WBC: 11.8 10*3/uL — ABNORMAL HIGH (ref 4.0–10.5)
nRBC: 0 % (ref 0.0–0.2)

## 2018-11-28 MED ORDER — HYDROCODONE-ACETAMINOPHEN 5-325 MG PO TABS: 1.0000 | ORAL_TABLET | Freq: Four times a day (QID) | ORAL | 0 refills | Status: DC | PRN

## 2018-11-28 MED ORDER — ASPIRIN 81 MG PO CHEW: 81.0000 mg | CHEWABLE_TABLET | Freq: Two times a day (BID) | ORAL | 1 refills | Status: DC

## 2018-11-28 NOTE — Progress Notes (Signed)
    Subjective:  Patient reports pain as mild to moderate.  Denies N/V/CP/SOB. Dementia requiring telesitter  Objective:   VITALS:   Vitals:   11/27/18 1431 11/27/18 2051 11/28/18 0447 11/28/18 0820  BP: 126/76 137/64 128/67 137/69  Pulse: 92 98 82 92  Resp: 16 18 18 16   Temp: 97.6 F (36.4 C) 97.9 F (36.6 C) 98.6 F (37 C) 97.6 F (36.4 C)  TempSrc: Oral Oral Oral Oral  SpO2: 94% 97% 92% 97%  Weight:      Height:        NAD ABD soft Sensation intact distally Intact pulses distally Dorsiflexion/Plantar flexion intact Incision: dressing C/D/I Compartment soft   Lab Results  Component Value Date   WBC 11.8 (H) 11/28/2018   HGB 12.0 (L) 11/28/2018   HCT 36.0 (L) 11/28/2018   MCV 86.5 11/28/2018   PLT 188 11/28/2018   BMET    Component Value Date/Time   NA 137 11/28/2018 0340   K 4.1 11/28/2018 0340   CL 103 11/28/2018 0340   CO2 25 11/28/2018 0340   GLUCOSE 119 (H) 11/28/2018 0340   BUN 31 (H) 11/28/2018 0340   CREATININE 1.03 11/28/2018 0340   CALCIUM 8.5 (L) 11/28/2018 0340   GFRNONAA >60 11/28/2018 0340   GFRAA >60 11/28/2018 0340     Assessment/Plan: 2 Days Post-Op   Principal Problem:   Hip fracture ([name]) Active Problems:   Essential hypertension, benign   Hyperglycemia   Atrial fibrillation with slow ventricular response ([name])   Chewing tobacco nicotine dependence   Dementia with behavioral disturbance ([name])   WBAT with walker DVT ppx: Lovenox --> ASA, SCDs, TEDS PO pain control PT/OT Dispo: family plans to bring him home with HHPT    Hilton Cork Philicia Heyne 11/28/2018, 5:39 PM   Rod Can, MD Cell: [phone] [name] [facility]  [facility] [place] [address], [place], [place] [address] Phone: [phone] [email or web address] Facebook  [employer]

## 2018-11-28 NOTE — Progress Notes (Signed)
PROGRESS NOTE    Dakota [name]  LAG:[id number] DOB: Sep 20, 1928 DOA: 11/25/2018 PCP: Tonia Ghent, MD   Brief Narrative:  HPI per Karmen Bongo on 11/25/2018 Dakota [name] is a 85 y.o. male with medical history significant of CVA; afib, not on AC due to GI bleeding; HTN; HLD; dementia; and pacemaker placement presenting with hip pain after a fall.  His sister called his daughter yesterday to report a fall Friday night about 1015.  Mechanical fall, he required help getting up.  He was able to get up in a chair yesterday and went to bed last night and was complaining of pain after getting up.  He took a couple of steps yesterday but unable to walk at all today.  Until this week, he was independently ambulatory.  He feeds himself, but it needs to be soft/pureed. He still knows family.   ED Course:  R femoral neck fracture after witnessed fall Friday night.  Was taken to Urgent Care and sent here.  Dr. Doreatha Martin will operate tomorrow.  **Interim History    Assessment & Plan:   Principal Problem:   Hip fracture ([place]) Active Problems:   Essential hypertension, benign   Hyperglycemia   Atrial fibrillation with slow ventricular response ([name])   Chewing tobacco nicotine dependence   Dementia with behavioral disturbance ([name])  R Hip Fracture -Mechanical fall resulting in hip fracture. -Underwent Righthip hemiarthroplasty, anterior approach on 7/27.  -Cont Lovenox post-operatively for now  -Cont pain control with Robxain; -Continue Hydrocodone-Acteaminophen 1-2 tab po q6hprn, and Morphine 0.5 mg IV q2hprn. -Obtain PT/OT Consult and they are recommending Home Health PT/OT and if Mental Status is back to baseline can D/C when ok with Orthopedic Surgery  Afib. PPM -Rate controlled with pacemaker.  -Not on AC due to h/o GI bleeding, may need BB if tachy  Dementia with superimposed Delirium  -Delirium post op and is Improving.  -Cont telesitter.  -Avoid benadryl, excessive opioids.  Started on low dose Quetiapine 25 mg po qHS  -Family is willing to come sit with him if needed  HTN -Not on meds at home.  -Continue to Monitor   Tobacco dependence -Smoking Cessation counseling given -Nicotine Patch at 14 mg TD   Leukocytosis, Trending down -Probable stress related and went from 16.5 -> 11.8. -CXR: no clear infiltrates.  -COVID-negative.  -Recently treated with antibiotics as outpatient. -Urinalysis showed hazy appearance, large hemoglobin, positive nitrites, many bacteria, greater than 50 RBCs per high-power field, and 6-10 white blood cells -Urine culture showed staph epidermidis with greater than 100,000 colony-forming units but ? If this is colonization  -Will discuss with ID in AM   GERD -C/w Pantoprazole 40 mg po Daily   DVT Prophylaxis: Enoxaparin 40 mg sq q24h Code Status: DO NOT RESUSCITATE  Family Communication:  Disposition Plan: Anticipate D/C Home with Home Health vs. SNF in the next 24-48 hours   Consultants:   Orthopedic Surgery    Procedures: Right hip hemiarthroplasty, anterior approach by Dr. Lyla Glassing   Antimicrobials:  Anti-infectives (From admission, onward)   Start     Dose/Rate Route Frequency Ordered Stop   11/26/18 1800  ceFAZolin (ANCEF) IVPB 2g/100 mL premix     2 g 200 mL/hr over 30 Minutes Intravenous Every 6 hours 11/26/18 1600 11/27/18 0014   11/26/18 1100  ceFAZolin (ANCEF) 2-4 GM/100ML-% IVPB    Note to Pharmacy: Laurita Quint   : cabinet override      11/26/18 1100 11/26/18 1245  11/26/18 1045  ceFAZolin (ANCEF) IVPB 2g/100 mL premix     2 g 200 mL/hr over 30 Minutes Intravenous On call to O.R. 11/26/18 1034 11/26/18 1255     Subjective: Seen and examined at bedside.  Pleasantly confused.  Had been feeding himself per nursing and nursing staff states that he is improving but still not back to baseline.  He is ambulating and PT OT evaluating.  No other concerns at this time and anticipating discharging home in  a.m. pending orthopedic clearance.  Objective: Vitals:   11/27/18 1431 11/27/18 2051 11/28/18 0447 11/28/18 0820  BP: 126/76 137/64 128/67 137/69  Pulse: 92 98 82 92  Resp: 16 18 18 16   Temp: 97.6 F (36.4 C) 97.9 F (36.6 C) 98.6 F (37 C) 97.6 F (36.4 C)  TempSrc: Oral Oral Oral Oral  SpO2: 94% 97% 92% 97%  Weight:      Height:        Intake/Output Summary (Last 24 hours) at 11/28/2018 1835 Last data filed at 11/28/2018 1700 Gross per 24 hour  Intake 480 ml  Output 1100 ml  Net -620 ml   Filed Weights   11/25/18 1344  Weight: 84.4 kg   Examination: Physical Exam:  Constitutional: WN/WD elderly Caucasian male NAD and appears calm and pleasantly confused Eyes: Lids and conjunctivae normal, sclerae anicteric  ENMT: External Ears, Nose appear normal. Grossly normal hearing. Mucous membranes are moist. Neck: Appears normal, supple, no cervical masses, normal ROM, no appreciable thyromegaly; no JVD Respiratory: Diminished to auscultation bilaterally, no wheezing, rales, rhonchi or crackles. Normal respiratory effort and patient is not tachypenic. No accessory muscle use.  Cardiovascular: RRR, no murmurs / rubs / gallops. S1 and S2 auscultated.  Abdomen: Soft, non-tender, non-distended. No masses palpated. No appreciable hepatosplenomegaly. Bowel sounds positive x4.  GU: Deferred. Musculoskeletal: Left Hip Incisions appear C/D/I Skin: No rashes, lesions, ulcers on a limited skin evaluation. No induration; Warm and dry.  Neurologic: CN 2-12 grossly intact with no focal deficits. Romberg sign and cerebellar reflexes not assessed.  Psychiatric: Impaired judgment and insight. Alert and oriented x 1. Anxious mood and appropriate affect.   Data Reviewed: I have personally reviewed following labs and imaging studies  CBC: Recent Labs  Lab 11/25/18 1318 11/26/18 0723 11/27/18 0425 11/28/18 0340  WBC 16.5* 14.8* 15.9* 11.8*  NEUTROABS 12.5*  --   --   --   HGB 15.0 14.2 13.2  12.0*  HCT 45.1 42.1 40.8 36.0*  MCV 86.9 85.9 87.9 86.5  PLT 202 176 202 762   Basic Metabolic Panel: Recent Labs  Lab 11/25/18 1318 11/26/18 0723 11/27/18 0425 11/28/18 0340  NA 139 138 139 137  K 4.1 4.2 4.5 4.1  CL 106 106 105 103  CO2 23 25 24 25   GLUCOSE 131* 123* 130* 119*  BUN 20 19 29* 31*  CREATININE 1.11 1.04 1.14 1.03  CALCIUM 9.5 9.1 8.9 8.5*   GFR: Estimated Creatinine Clearance: 53.5 mL/min (by C-G formula based on SCr of 1.03 mg/dL). Liver Function Tests: No results for input(s): AST, ALT, ALKPHOS, BILITOT, PROT, ALBUMIN in the last 168 hours. No results for input(s): LIPASE, AMYLASE in the last 168 hours. No results for input(s): AMMONIA in the last 168 hours. Coagulation Profile: Recent Labs  Lab 11/25/18 1318  INR 1.3*   Cardiac Enzymes: No results for input(s): CKTOTAL, CKMB, CKMBINDEX, TROPONINI in the last 168 hours. BNP (last 3 results) No results for input(s): PROBNP in the last 8760 hours.  HbA1C: No results for input(s): HGBA1C in the last 72 hours. CBG: No results for input(s): GLUCAP in the last 168 hours. Lipid Profile: No results for input(s): CHOL, HDL, LDLCALC, TRIG, CHOLHDL, LDLDIRECT in the last 72 hours. Thyroid Function Tests: No results for input(s): TSH, T4TOTAL, FREET4, T3FREE, THYROIDAB in the last 72 hours. Anemia Panel: No results for input(s): VITAMINB12, FOLATE, FERRITIN, TIBC, IRON, RETICCTPCT in the last 72 hours. Sepsis Labs: No results for input(s): PROCALCITON, LATICACIDVEN in the last 168 hours.  Recent Results (from the past 240 hour(s))  Novel Coronavirus, NAA ([facility])  Drive up testing site only     Status: None   Collection Time: 11/19/18  8:40 AM  Result Value Ref Range Status   SARS-CoV-2, NAA Not Detected Not Detected Final    Comment: This test was developed and its performance characteristics determined by [employer]. This test has not been FDA cleared or approved. This test has been  authorized by FDA under an Emergency Use Authorization (EUA). This test is only authorized for the duration of time the declaration that circumstances exist justifying the authorization of the emergency use of in vitro diagnostic tests for detection of SARS-CoV-2 virus and/or diagnosis of COVID-19 infection under section 564(b)(1) of the Act, 21 U.S.C. 725DGU-4(Q)(0), unless the authorization is terminated or revoked sooner. When diagnostic testing is negative, the possibility of a false negative result should be considered in the context of a patient's recent exposures and the presence of clinical signs and symptoms consistent with COVID-19. An individual without symptoms of COVID-19 and who is not shedding SARS-CoV-2 virus would expect to have a negative (not detected) result in this assay.   SARS Coronavirus 2 (CEPHEID - Performed in [place] hospital lab), Hosp Order     Status: None   Collection Time: 11/25/18  1:57 PM   Specimen: Nasopharyngeal Swab  Result Value Ref Range Status   SARS Coronavirus 2 NEGATIVE NEGATIVE Final    Comment: (NOTE) If result is NEGATIVE SARS-CoV-2 target nucleic acids are NOT DETECTED. The SARS-CoV-2 RNA is generally detectable in upper and lower  respiratory specimens during the acute phase of infection. The lowest  concentration of SARS-CoV-2 viral copies this assay can detect is 250  copies / mL. A negative result does not preclude SARS-CoV-2 infection  and should not be used as the sole basis for treatment or other  patient management decisions.  A negative result may occur with  improper specimen collection / handling, submission of specimen other  than nasopharyngeal swab, presence of viral mutation(s) within the  areas targeted by this assay, and inadequate number of viral copies  (<250 copies / mL). A negative result must be combined with clinical  observations, patient history, and epidemiological information. If result is POSITIVE  SARS-CoV-2 target nucleic acids are DETECTED. The SARS-CoV-2 RNA is generally detectable in upper and lower  respiratory specimens dur ing the acute phase of infection.  Positive  results are indicative of active infection with SARS-CoV-2.  Clinical  correlation with patient history and other diagnostic information is  necessary to determine patient infection status.  Positive results do  not rule out bacterial infection or co-infection with other viruses. If result is PRESUMPTIVE POSTIVE SARS-CoV-2 nucleic acids MAY BE PRESENT.   A presumptive positive result was obtained on the submitted specimen  and confirmed on repeat testing.  While 2019 novel coronavirus  (SARS-CoV-2) nucleic acids may be present in the submitted sample  additional confirmatory testing may be necessary  for epidemiological  and / or clinical management purposes  to differentiate between  SARS-CoV-2 and other Sarbecovirus currently known to infect humans.  If clinically indicated additional testing with an alternate test  methodology [phone]) is advised. The SARS-CoV-2 RNA is generally  detectable in upper and lower respiratory sp ecimens during the acute  phase of infection. The expected result is Negative. Fact Sheet for Patients:  StrictlyIdeas.no Fact Sheet for Healthcare Providers: BankingDealers.co.za This test is not yet approved or cleared by the [place] FDA and has been authorized for detection and/or diagnosis of SARS-CoV-2 by FDA under an Emergency Use Authorization (EUA).  This EUA will remain in effect (meaning this test can be used) for the duration of the COVID-19 declaration under Section 564(b)(1) of the Act, 21 U.S.C. section 360bbb-3(b)(1), unless the authorization is terminated or revoked sooner. Performed at [facility], [place] [address], [place], [place] [address]   Surgical pcr screen     Status: None   Collection Time: 11/25/18   4:43 PM   Specimen: Nasal Mucosa; Nasal Swab  Result Value Ref Range Status   MRSA, PCR NEGATIVE NEGATIVE Final   Staphylococcus aureus NEGATIVE NEGATIVE Final    Comment: (NOTE) The Xpert SA Assay (FDA approved for NASAL specimens in patients 66 years of age and older), is one component of a comprehensive surveillance program. It is not intended to diagnose infection nor to guide or monitor treatment. Performed at [facility], [place] [address] [place], [place] [address]   Urine Culture     Status: Abnormal   Collection Time: 11/25/18  7:38 PM   Specimen: Urine, Clean Catch  Result Value Ref Range Status   Specimen Description URINE, CLEAN CATCH  Final   Special Requests   Final    NONE Performed at [facility], [place] [address] [place], [place] [address]    Culture >=100,000 COLONIES/mL STAPHYLOCOCCUS EPIDERMIDIS (A)  Final   Report Status 11/28/2018 FINAL  Final   Organism ID, Bacteria STAPHYLOCOCCUS EPIDERMIDIS (A)  Final      Susceptibility   Staphylococcus epidermidis - MIC*    CIPROFLOXACIN >=8 RESISTANT Resistant     GENTAMICIN <=0.5 SENSITIVE Sensitive     NITROFURANTOIN <=16 SENSITIVE Sensitive     OXACILLIN <=0.25 SENSITIVE Sensitive     TETRACYCLINE 4 SENSITIVE Sensitive     VANCOMYCIN 2 SENSITIVE Sensitive     TRIMETH/SULFA 160 RESISTANT Resistant     CLINDAMYCIN <=0.25 SENSITIVE Sensitive     RIFAMPIN <=0.5 SENSITIVE Sensitive     Inducible Clindamycin NEGATIVE Sensitive     * >=100,000 COLONIES/mL STAPHYLOCOCCUS EPIDERMIDIS     Radiology Studies: No results found.  Scheduled Meds: . aspirin EC  81 mg Oral Daily  . docusate sodium  100 mg Oral BID  . enoxaparin (LOVENOX) injection  40 mg Subcutaneous Q24H  . feeding supplement (ENSURE ENLIVE)  237 mL Oral BID BM  . nicotine  14 mg Transdermal Daily  . pantoprazole  40 mg Oral Daily  . QUEtiapine  25 mg Oral QHS  . tamsulosin  0.4 mg Oral Daily   Continuous Infusions: . methocarbamol  (ROBAXIN) IV      LOS: 3 days   Kerney Elbe, DO [facility] PAGER is on AMION  If 7PM-7AM, please contact night-coverage [email or web address] Password [facility] 11/28/2018, 6:35 PM

## 2018-11-28 NOTE — Progress Notes (Signed)
Pt is pleasantly confused. Sitting up in the chair now. I updated his daughter [name].

## 2018-11-28 NOTE — Plan of Care (Signed)

## 2018-11-28 NOTE — Plan of Care (Signed)
  Problem: Clinical Measurements: Goal: Will remain free from infection Outcome: Progressing   Problem: Activity: Goal: Risk for activity intolerance will decrease Outcome: Progressing   Problem: Pain Managment: Goal: General experience of comfort will improve Outcome: Progressing   Problem: Safety: Goal: Ability to remain free from injury will improve Outcome: Progressing   

## 2018-11-28 NOTE — Progress Notes (Signed)
Physical Therapy Treatment Patient Details Name: Dakota [name] MRN: [id number] DOB: 1928/06/21 Today's Date: 11/28/2018    History of Present Illness Pt is an 85 y.o. male admitted 11/25/18 after fall sustaining R femoral neck fx. S/p R hip hemiarthroplasty (anterior approach) 7/27. PMH includes dementia, HTN, afib, pacemaker, CVA.   PT Comments    Pt received attempting to get out of bed, pleasantly confused requiring increased time to redirect. Once pt reoriented, is aware of location and reason for admission, he is just focused on getting home. Agreeable to resistive BLE therex and engaging well, denies RLE pain. Worked on feeding task, pt requires assist for set-up and intermittent cues for technique; requires frequent cues to engage LUE in ADL tasks.   Follow Up Recommendations  Home health PT;Supervision/Assistance - 24 hour(family declined SNF)     Equipment Recommendations  Other (comment)(hoyer lift)    Recommendations for Other Services       Precautions / Restrictions Precautions Precautions: Fall Precaution Comments: L-side residual weakness; bilateral soft mitts when confused Restrictions Weight Bearing Restrictions: Yes RLE Weight Bearing: Weight bearing as tolerated    Mobility  Bed Mobility Overal bed mobility: Needs Assistance Bed Mobility: Rolling Rolling: Min assist         General bed mobility comments: Pt able to bridge hips and slide up in bed with RUE support on top bed rail; minA for repositioning hips and BLEs. Responds well to simple, frequent cues. Denies RLE pain  Transfers                    Ambulation/Gait                 Stairs             Wheelchair Mobility    Modified Rankin (Stroke Patients Only)       Balance                                            Cognition Arousal/Alertness: Awake/alert Behavior During Therapy: WFL for tasks assessed/performed Overall Cognitive Status:  History of cognitive impairments - at baseline                                 General Comments: H/o dementia. Following majority of simple one-step commands; frequent cues to engage L-side. Answering simple questions appropriately with one-word answers      Exercises General Exercises - Lower Extremity Heel Slides: AAROM;Both;Supine Hip ABduction/ADduction: AAROM;Both;Supine Straight Leg Raises: AAROM;Both;Supine    General Comments General comments (skin integrity, edema, etc.): Pt received attempting to get out of bed; pleasantly confused, increased time to reorient. Food tray arrived but pt in soft mitts; pt able to feed himself requiring assist for set-up and intermittent cues to slow down/smaller bites, engage LUE, etc.      Pertinent Vitals/Pain Pain Assessment: No/denies pain    Home Living                      Prior Function            PT Goals (current goals can now be found in the care plan section) Acute Rehab PT Goals Patient Stated Goal: Return home with increased assist PT Goal Formulation: With family Time For Goal Achievement: 12/11/18 Potential to Achieve  Goals: Fair Progress towards PT goals: Progressing toward goals    Frequency    Min 5X/week      PT Plan Current plan remains appropriate    Co-evaluation              AM-PAC PT "6 Clicks" Mobility   Outcome Measure  Help needed turning from your back to your side while in a flat bed without using bedrails?: A Little Help needed moving from lying on your back to sitting on the side of a flat bed without using bedrails?: A Lot Help needed moving to and from a bed to a chair (including a wheelchair)?: A Lot Help needed standing up from a chair using your arms (e.g., wheelchair or bedside chair)?: A Lot Help needed to walk in hospital room?: A Lot Help needed climbing 3-5 steps with a railing? : Total 6 Click Score: 12    End of Session   Activity Tolerance:  Patient tolerated treatment well Patient left: in bed;with call bell/phone within reach;with bed alarm set;Other (comment)(with soft mitts donned) Nurse Communication: Mobility status PT Visit Diagnosis: Other abnormalities of gait and mobility (R26.89);Muscle weakness (generalized) (M62.81)     Time: 4360-6770 PT Time Calculation (min) (ACUTE ONLY): 15 min  Charges:  $Therapeutic Exercise: 8-22 mins                    Mabeline Caras, PT, DPT Acute Rehabilitation Services  Pager [phone] Office [place] 11/28/2018, 5:38 PM

## 2018-11-28 NOTE — Consult Note (Signed)
   [facility] CM Inpatient Consult   11/28/2018  Dakota [name] Jun 09, 1928 [id number]   Patient screened for high risk score for unplanned readmission score and to assess for  potential [name] services. Patient is eligible for programs under [name].    Review of patient's medical record reveals patient is per MD notes 11/27/2018:  The patient is an 85 y.o.malewith medical history significant ofCVA; afib, not on AC due to GI bleeding; HTN; HLD; dementia; and pacemaker placement presenting with hip pain after a fall.His sister called his daughter yesterday to report a fall Friday night about 1015. Mechanical fall, he required help getting up. He was able to get up in a chair yesterday and went to bed last night and was complaining of pain after getting up. He took a couple of steps yesterday but unable to walk at all today. Until this week, he was independently ambulatory. He feeds himself, but it needs to be soft/pureed. He still knows family.  Patient admitted with right hip fracture and underwent a Right hip hemiarthroplasty on 11/26/2018.  Primary Care Provider is  Dr. [name], [name], this office is listed to provide the transition of care follow up.  Pharmacy is:  [name] to provider:  Family   Plan:  Follow for needs and follow inpatient [name] team notes for disposition needs. Noted for [name] referred.  Will follow up with [name] with [name] as well.  For questions contact:   [name], RN BSN [facility] Liaison  [phone] business mobile phone Toll free office [phone]  Fax number: [phone] [name]

## 2018-11-28 NOTE — Progress Notes (Signed)
Occupational Therapy Evaluation Patient Details Name: Dakota [name] MRN: [id number] DOB: 1928-05-29 Today's Date: 11/28/2018    History of Present Illness Pt is an 85 y.o. male admitted 11/25/18 after fall sustaining R femoral neck fx. S/p R hip hemiarthroplasty (anterior approach) 7/27. PMH includes dementia, HTN, afib, pacemaker, CVA.   Clinical Impression   Pt presents with above diagnosis. PTA pt PLOF provided from daughter stating pt required assists with most ADLs and limited in functional mobility due to residual L side weakness from prior CVA. Pt presents with dementia following simple commands. Pt will benefit from continued acute OT to address safe engagement in ADLs and caregiver education to ensure safe transition to home environment with [place].    Follow Up Recommendations  Home health OT;Supervision/Assistance - 24 hour    Equipment Recommendations  3 in 1 bedside commode    Recommendations for Other Services       Precautions / Restrictions Precautions Precautions: Fall Precaution Comments: L-side residual weakness Restrictions Weight Bearing Restrictions: Yes RLE Weight Bearing: Weight bearing as tolerated      Mobility Bed Mobility Overal bed mobility: Needs Assistance Bed Mobility: Sit to Supine     Supine to sit: Min assist     General bed mobility comments: Pt able to lower trunk back into required min A for management of BLE entering bed.   Transfers Overall transfer level: Needs assistance Equipment used: Rolling walker (2 wheeled) Transfers: Sit to/from Stand Sit to Stand: From elevated surface;Max assist         General transfer comment: Max A with sit to stand transfer from chair to bed. OT blocked left knee to prevent buckling.    Balance Overall balance assessment: Needs assistance   Sitting balance-Leahy Scale: Fair   Postural control: Posterior lean;Left lateral lean   Standing balance-Leahy Scale: Poor Standing balance comment:  Reliant on UE support and external assist                           ADL either performed or assessed with clinical judgement   ADL Overall ADL's : Needs assistance/impaired Eating/Feeding: Set up;Sitting   Grooming: Set up;Cueing for sequencing;Sitting   Upper Body Bathing: Minimal assistance;Sitting   Lower Body Bathing: Maximal assistance;Sitting/lateral leans   Upper Body Dressing : Minimal assistance;Sitting   Lower Body Dressing: Maximal assistance;Sitting/lateral leans   Toilet Transfer: Maximal assistance;Stand-pivot Toilet Transfer Details (indicate cue type and reason): Max A 2 hand held assist for simulated toilet transfer from chair to bed.         Functional mobility during ADLs: Maximal assistance General ADL Comments: Pt demonstrates improved functional transfers going toward R side due to residual L side weakness from prior CVA.      Vision         Perception     Praxis      Pertinent Vitals/Pain Pain Assessment: Faces Faces Pain Scale: Hurts little more Pain Location: R hip Pain Descriptors / Indicators: Guarding;Grimacing Pain Intervention(s): Monitored during session;Repositioned     Hand Dominance Right   Extremity/Trunk Assessment Upper Extremity Assessment Upper Extremity Assessment: LUE deficits/detail LUE Deficits / Details: residual weakness from prior CVA and limited ROM LUE Coordination: decreased gross motor;decreased fine motor   Lower Extremity Assessment Lower Extremity Assessment: Defer to PT evaluation       Communication Communication Communication: HOH   Cognition Arousal/Alertness: Awake/alert Behavior During Therapy: WFL for tasks assessed/performed Overall Cognitive Status: History of  cognitive impairments - at baseline                                 General Comments: H/o dementia. Following majority of simple one-step commands; frequent cues to engage L-side. Answering simple questions  appropriately with one-word answers   General Comments  Daughter provided PLOF for pt due to h/o dementia.     Exercises General Exercises - Lower Extremity Long Arc Quad: AROM;Both;Seated Hip Flexion/Marching: AROM;Both;Seated   Shoulder Instructions      Home Living Family/patient expects to be discharged to:: Private residence Living Arrangements: Alone(with care giver) Available Help at Discharge: Personal care attendant;Available 24 hours/day;Family Type of Home: House Home Access: Ramped entrance     Home Layout: One level     Bathroom Shower/Tub: Tub/shower unit         Home Equipment: Environmental consultant - 2 wheels;Bedside commode;Wheelchair - manual;Shower seat;Hand held shower head;Hospital bed;Other (comment)   Additional Comments: Lift chair recliner, daughter reports receiving help for most ADLs for safety. Within the recent months, his functional mobility declined. Daughter states he used cane but family would provide walker as needed,       Prior Functioning/Environment Level of Independence: Needs assistance  Gait / Transfers Assistance Needed: Ambulatory with RW without assist, caregiver providing 24/7 supervision. Intermittent assist from caregiver to get out of bed/stand. Typically sleeps in regular bed, but has lift recliner and hospital bed ADL's / [name] Needed: Caregiver assists with bathing, dressing, pericare; ADL set-up; assist to get into tub and sit on chair   Comments: Enjoys riding on gator while his son drives and looking at his garden; used to be a Psychologist, counselling        OT Problem List: Decreased strength;Decreased range of motion;Decreased activity tolerance;Impaired balance (sitting and/or standing);Decreased cognition;Decreased safety awareness;Decreased knowledge of use of DME or AE;Decreased knowledge of precautions;Pain      OT Treatment/Interventions: Self-care/ADL training;Therapeutic exercise;DME and/or AE  instruction;Therapeutic activities;Patient/family education;Balance training    OT Goals(Current goals can be found in the care plan section) Acute Rehab OT Goals Patient Stated Goal: Return home with increased assist OT Goal Formulation: With family Time For Goal Achievement: 12/12/18 Potential to Achieve Goals: Fair  OT Frequency: Min 2X/week   Barriers to D/C:            Co-evaluation              AM-PAC OT "6 Clicks" Daily Activity     Outcome Measure Help from another person eating meals?: A Little Help from another person taking care of personal grooming?: A Little Help from another person toileting, which includes using toliet, bedpan, or urinal?: A Lot Help from another person bathing (including washing, rinsing, drying)?: A Lot Help from another person to put on and taking off regular upper body clothing?: A Little Help from another person to put on and taking off regular lower body clothing?: A Lot 6 Click Score: 15   End of Session Equipment Utilized During Treatment: Gait belt Nurse Communication: Mobility status;Weight bearing status  Activity Tolerance: Patient tolerated treatment well;No increased pain Patient left: in bed;with call bell/phone within reach;with bed alarm set  OT Visit Diagnosis: Unsteadiness on feet (R26.81);Pain;Other symptoms and signs involving cognitive function Pain - Right/Left: Right Pain - part of body: Hip                Time: 7616-0737 OT Time  Calculation (min): 17 min Charges:  OT General Charges $OT Visit: 1 Visit OT Evaluation $OT Eval Low Complexity: [place], MSOT, OTR/L  Supplemental Rehabilitation Services  [phone]   Marius Ditch 11/28/2018, 12:16 PM

## 2018-11-28 NOTE — Progress Notes (Signed)
Physical Therapy Treatment Patient Details Name: Dakota [name] MRN: [id number] DOB: [birth year] Today's Date: 11/28/2018    History of Present Illness Pt is an 85 y.o. male admitted 11/25/18 after fall sustaining R femoral neck fx. S/p R hip hemiarthroplasty (anterior approach) 7/27. PMH includes dementia, HTN, afib, pacemaker, CVA.   PT Comments    Pt slowly progressing with mobility. Pt requiring maxA to perform standing and take steps with RW; better able to initiate upright posture with max, multimodal cues and assist. Pt remains limited by L-side residual weakness and difficulty accepting weight onto RLE in order to take complete steps. Plans to return home with 24/7 assist from family; recommend hoyer lift as pt currently requiring maxA for mobility.   Follow Up Recommendations  Home health PT;Supervision/Assistance - 24 hour(family declined SNF)     Equipment Recommendations  Other (comment)(hoyer lift)    Recommendations for Other Services       Precautions / Restrictions Precautions Precautions: Fall Precaution Comments: L-side residual weakness Restrictions Weight Bearing Restrictions: Yes RLE Weight Bearing: Weight bearing as tolerated    Mobility  Bed Mobility Overal bed mobility: Needs Assistance Bed Mobility: Supine to Sit     Supine to sit: Mod assist     General bed mobility comments: Able to initiate movement to EOB well, modA to manage BLEs and assist trunk elevation; painful RLE with movement  Transfers Overall transfer level: Needs assistance Equipment used: Rolling walker (2 wheeled) Transfers: Sit to/from Stand Sit to Stand: Mod assist;From elevated surface         General transfer comment: Significant L-lateral lean, able to scoot to EOB with cues, assist to place LLE in preparation to stand; modA for trunk elevation, assist to place LUE on RW  Ambulation/Gait Ambulation/Gait assistance: Max assist Gait Distance (Feet): 2 Feet Assistive  device: Rolling walker (2 wheeled) Gait Pattern/deviations: Step-to pattern;Leaning posteriorly;Trunk flexed;Staggering left Gait velocity: Decreased   General Gait Details: Pt better able to engage upright posture with max, multimodal cues; maxA to maintain upright posture with RW and cue steps. Pt able to take step with RLE, but difficulty engaging LLE despite max, multimodal cues, scooting L foot minimally   Stairs             Wheelchair Mobility    Modified Rankin (Stroke Patients Only)       Balance Overall balance assessment: Needs assistance   Sitting balance-Leahy Scale: Fair   Postural control: Posterior lean;Left lateral lean   Standing balance-Leahy Scale: Poor Standing balance comment: Reliant on UE support and external assist                            Cognition Arousal/Alertness: Awake/alert Behavior During Therapy: WFL for tasks assessed/performed Overall Cognitive Status: History of cognitive impairments - at baseline                                 General Comments: H/o dementia. Following majority of simple one-step commands; frequent cues to engage L-side. Answering simple questions appropriately with one-word answers      Exercises General Exercises - Lower Extremity Long Arc Quad: AROM;Both;Seated Hip Flexion/Marching: AROM;Both;Seated    General Comments        Pertinent Vitals/Pain Pain Assessment: Faces Faces Pain Scale: Hurts even more Pain Location: R hip Pain Descriptors / Indicators: Guarding;Grimacing Pain Intervention(s): Monitored during session;Limited activity within patient's  tolerance;Repositioned    Home Living                      Prior Function            PT Goals (current goals can now be found in the care plan section) Acute Rehab PT Goals Patient Stated Goal: Return home with increased assist PT Goal Formulation: With family Time For Goal Achievement: 12/11/18 Potential to  Achieve Goals: Fair Progress towards PT goals: Progressing toward goals    Frequency    Min 5X/week      PT Plan Current plan remains appropriate    Co-evaluation              AM-PAC PT "6 Clicks" Mobility   Outcome Measure  Help needed turning from your back to your side while in a flat bed without using bedrails?: A Lot Help needed moving from lying on your back to sitting on the side of a flat bed without using bedrails?: A Lot Help needed moving to and from a bed to a chair (including a wheelchair)?: A Lot Help needed standing up from a chair using your arms (e.g., wheelchair or bedside chair)?: A Lot Help needed to walk in hospital room?: A Lot Help needed climbing 3-5 steps with a railing? : Total 6 Click Score: 11    End of Session Equipment Utilized During Treatment: Gait belt Activity Tolerance: Patient tolerated treatment well Patient left: in chair;with call bell/phone within reach;with chair alarm set Nurse Communication: Mobility status PT Visit Diagnosis: Other abnormalities of gait and mobility (R26.89);Muscle weakness (generalized) (M62.81)     Time: 7943-2761 PT Time Calculation (min) (ACUTE ONLY): 26 min  Charges:  $Gait Training: 8-22 mins $Therapeutic Activity: 8-22 mins                    Mabeline Caras, PT, DPT Acute Rehabilitation Services  Pager [phone] Office [place] 11/28/2018, 9:35 AM

## 2018-11-29 DIAGNOSIS — R8271 Bacteriuria: Secondary | ICD-10-CM

## 2018-11-29 LAB — CBC WITH DIFFERENTIAL/PLATELET
Abs Immature Granulocytes: 0.06 10*3/uL (ref 0.00–0.07)
Basophils Absolute: 0.1 10*3/uL (ref 0.0–0.1)
Basophils Relative: 0 %
Eosinophils Absolute: 0.6 10*3/uL — ABNORMAL HIGH (ref 0.0–0.5)
Eosinophils Relative: 5 %
HCT: 37.5 % — ABNORMAL LOW (ref 39.0–52.0)
Hemoglobin: 12.4 g/dL — ABNORMAL LOW (ref 13.0–17.0)
Immature Granulocytes: 1 %
Lymphocytes Relative: 26 %
Lymphs Abs: 3.1 10*3/uL (ref 0.7–4.0)
MCH: 28.7 pg (ref 26.0–34.0)
MCHC: 33.1 g/dL (ref 30.0–36.0)
MCV: 86.8 fL (ref 80.0–100.0)
Monocytes Absolute: 1.6 10*3/uL — ABNORMAL HIGH (ref 0.1–1.0)
Monocytes Relative: 13 %
Neutro Abs: 6.4 10*3/uL (ref 1.7–7.7)
Neutrophils Relative %: 55 %
Platelets: 215 10*3/uL (ref 150–400)
RBC: 4.32 MIL/uL (ref 4.22–5.81)
RDW: 13.5 % (ref 11.5–15.5)
WBC: 11.7 10*3/uL — ABNORMAL HIGH (ref 4.0–10.5)
nRBC: 0 % (ref 0.0–0.2)

## 2018-11-29 LAB — PHOSPHORUS: Phosphorus: 2.3 mg/dL — ABNORMAL LOW (ref 2.5–4.6)

## 2018-11-29 LAB — COMPREHENSIVE METABOLIC PANEL
ALT: 14 U/L (ref 0–44)
AST: 29 U/L (ref 15–41)
Albumin: 2.7 g/dL — ABNORMAL LOW (ref 3.5–5.0)
Alkaline Phosphatase: 51 U/L (ref 38–126)
Anion gap: 8 (ref 5–15)
BUN: 20 mg/dL (ref 8–23)
CO2: 25 mmol/L (ref 22–32)
Calcium: 8.9 mg/dL (ref 8.9–10.3)
Chloride: 101 mmol/L (ref 98–111)
Creatinine, Ser: 0.86 mg/dL (ref 0.61–1.24)
GFR calc Af Amer: >60 mL/min (ref >60)
GFR calc non Af Amer: >60 mL/min (ref >60)
Glucose, Bld: 124 mg/dL — ABNORMAL HIGH (ref 70–99)
Potassium: 3.9 mmol/L (ref 3.5–5.1)
Sodium: 134 mmol/L — ABNORMAL LOW (ref 135–145)
Total Bilirubin: 1.2 mg/dL (ref 0.3–1.2)
Total Protein: 6.3 g/dL — ABNORMAL LOW (ref 6.5–8.1)

## 2018-11-29 LAB — MAGNESIUM: Magnesium: 1.7 mg/dL (ref 1.7–2.4)

## 2018-11-29 MED ORDER — ENSURE ENLIVE PO LIQD: 237.0000 mL | Freq: Two times a day (BID) | ORAL | 12 refills | Status: AC

## 2018-11-29 MED ORDER — BISACODYL 5 MG PO TBEC: 5.0000 mg | DELAYED_RELEASE_TABLET | Freq: Every day | ORAL | 0 refills | Status: DC | PRN

## 2018-11-29 MED ORDER — QUETIAPINE FUMARATE 25 MG PO TABS: 25.0000 mg | ORAL_TABLET | Freq: Every day | ORAL | 0 refills | Status: DC

## 2018-11-29 MED ORDER — MENTHOL 3 MG MT LOZG: 1.0000 | LOZENGE | OROMUCOSAL | 12 refills | Status: DC | PRN

## 2018-11-29 MED ORDER — ONDANSETRON HCL 4 MG PO TABS: 4.0000 mg | ORAL_TABLET | Freq: Four times a day (QID) | ORAL | 0 refills | Status: DC | PRN

## 2018-11-29 MED ORDER — NICOTINE 14 MG/24HR TD PT24: 14.0000 mg | MEDICATED_PATCH | Freq: Every day | TRANSDERMAL | 0 refills | Status: DC

## 2018-11-29 MED ORDER — METHOCARBAMOL 500 MG PO TABS: 500.0000 mg | ORAL_TABLET | Freq: Four times a day (QID) | ORAL | 0 refills | Status: DC | PRN

## 2018-11-29 MED ORDER — DOCUSATE SODIUM 100 MG PO CAPS: 100.0000 mg | ORAL_CAPSULE | Freq: Two times a day (BID) | ORAL | 0 refills | Status: DC

## 2018-11-29 NOTE — Plan of Care (Signed)

## 2018-11-29 NOTE — Progress Notes (Signed)
Provided discharge education/instructions to son Laurey Arrow, all questions and concerns addressed, Pt not in distress, discharged home with belongings accompanied by [facility].

## 2018-11-29 NOTE — Discharge Summary (Addendum)
Physician Discharge Summary  [name]:[id number] DOB: 1929-02-26 DOA: 11/25/2018  PCP: Dakota Ghent, MD  Admit date: 11/25/2018 Discharge date: 11/29/2018  Admitted From: Home Disposition: Home with [facility] PT/OT/RN/Aide/SW  Recommendations for Outpatient Follow-up:  1. Follow up with PCP in 1-2 weeks 2. Follow up with Orthopedic Surgery Dr. Lyla [name] within 1-2 weeks 3. Treat Staph Epidermidis in Urine if patient becomes febrile or symptomatic 4. Please obtain CMP/CBC, Mag, Phos in one week 5. Please follow up on the following pending results:  Home Health: Yes Equipment/Devices: [employer] fast streamer, 3in1    Discharge Condition: Stable CODE STATUS: FULL CODE  Diet recommendation: Dysphagia 1 Diet   Brief/Interim Summary: HPI per Dakota [name] on 11/25/2018 Dakota [name] a 85 y.o.malewith medical history significant ofCVA; afib, not on AC due to GI bleeding; HTN; HLD; dementia; and pacemaker placement presenting with hip pain after a fall.His sister called his daughter yesterday to report a fall Friday night about 1015. Mechanical fall, he required help getting up. He was able to get up in a chair yesterday and went to bed last night and was complaining of pain after getting up. He took a couple of steps yesterday but unable to walk at all today. Until this week, he was independently ambulatory. He feeds himself, but it needs to be soft/pureed. He still knows family.   ED Course:R femoral neck fracture after witnessed fall Friday night. Was taken to Urgent Care and sent here. Dakota [name] will operate tomorrow.  **Interim History  Patient had some postoperative delirium and improved.  Daughter confirmed that he is back to baseline.  His mental status waxes and wanes due to his dementia.  He is deemed medically stable for discharge.  Prior to discharge his urine grew out staph epidermidis with greater than 100,000 colonies.  Case was discussed with infectious  diseases who recommends not to treat since the patient is afebrile.  White blood cell count is trending down a likely secondary surgery.  Patient was deemed stable for discharge will need to follow-up with PCP as well as orthopedic surgery.  If patient becomes symptomatic with urinary tract infection or spikes fever then he will need treatment for staph epidermidis UTI.  Discharge Diagnoses:  Principal Problem:   Hip fracture (Dakota [name]) Active Problems:   Essential hypertension, benign   Hyperglycemia   Atrial fibrillation with slow ventricular response ([name])   Chewing tobacco nicotine dependence   Dementia with behavioral disturbance ([name])  R Hip Fracture -Mechanical fall resulting in hip fracture. -Underwent Righthip hemiarthroplasty, anterior approach on 7/27. -ContLovenox post-operatively for now  -Cont pain control with Robxain; -Continue Hydrocodone-Acteaminophen 1-2 tab po q6hprn, and Morphine 0.5 mg IV q2hprn while hospitalized.  Orthopedic surgeon for pain control as well as VT prophylaxis at discharge -Obtain PT/OT Consult and they are recommending Home Health PT/OT and since mental Status is back to baseline we will discharge home with home health as patient's family has refused SNF  Afib. PPM -Rate controlled with pacemaker.  -Not on AC due to h/o GI bleeding, may need BB if tachy  Dementia with superimposed Delirium  -Delirium post op and is Improving and per daughter is back to baseline.  -Continuedtelesitter.  -Avoid benadryl, excessive opioids. Started on low doseQuetiapine 25 mg po qHS  -Family is willing to come sit with him if needed  HTN -Not on meds at home.  -Continue to Monitor   Tobacco dependence -Smoking Cessation counseling given -Nicotine Patch at 14 mg TD  Leukocytosis, Trending down -Probable stress related and went from 16.5 -> 11.7. -CXR: no clear infiltrates.  -COVID-negative.  -Recently treated with antibiotics as  outpatient. -Urinalysis showed hazy appearance, large hemoglobin, positive nitrites, many bacteria, greater than 50 RBCs per high-power field, and 6-10 white blood cells -Urine culture showed staph epidermidis with greater than 100,000 colony-forming units but ? If this is colonization; case was discussed with Dakota [name] of infectious diseases who feels that this is not a true infection and recommends not to treat since the patient is afebrile for last 48 hours  GERD -C/w Pantoprazole 40 mg po Daily   Hyponatremia -Is mild at 134 -We will continue monitor and trend -Repeat CMP in the outpatient setting  Normocytic Anemia -Patient's hemoglobin/hematocrit dropped from 15.0/45.1 is now 12.4/37.5 -Likely postoperative drop and expected next-check anemia panel outpatient setting We will continue monitor for signs and symptoms of bleeding; currently no overt bleeding noted-repeat CBC in the outpatient setting  Hypophosphatemia -The patient's phosphorus level is 2.3 -Replete prior to discharge Continue monitor and trend and repeat phosphorus level in outpatient setting  Discharge Instructions  Discharge Instructions    Call MD for:  difficulty breathing, headache or visual disturbances   Complete by: As directed    Call MD for:  extreme fatigue   Complete by: As directed    Call MD for:  hives   Complete by: As directed    Call MD for:  persistant dizziness or light-headedness   Complete by: As directed    Call MD for:  persistant nausea and vomiting   Complete by: As directed    Call MD for:  redness, tenderness, or signs of infection (pain, swelling, redness, odor or green/yellow discharge around incision site)   Complete by: As directed    Call MD for:  severe uncontrolled pain   Complete by: As directed    Call MD for:  temperature >100.4   Complete by: As directed    Diet - low sodium heart healthy   Complete by: As directed    Discharge instructions   Complete by: As  directed    You were cared for by a hospitalist during your hospital stay. If you have any questions about your discharge medications or the care you received while you were in the hospital after you are discharged, you can call the unit and ask to speak with the hospitalist on call if the hospitalist that took care of you is not available. Once you are discharged, your primary care physician will handle any further medical issues. Please note that NO REFILLS for any discharge medications will be authorized once you are discharged, as it is imperative that you return to your primary care physician (or establish a relationship with a primary care physician if you do not have one) for your aftercare needs so that they can reassess your need for medications and monitor your lab values.  Follow up with PCP and Orthopedic Surgery. Take all medications as prescribed. If symptoms change or worsen please return to the ED for evaluation   For home use only DME Other see comment   Complete by: As directed    [employer] per PT Reccs   Length of Need: 6 Months   Increase activity slowly   Complete by: As directed      Allergies as of 11/29/2018      Reactions   Warfarin And Related Other (See Comments)   Lost a lot of weight and  also experienced numbness   Atorvastatin Other (See Comments)   REACTION: MUSCLE PAIN   Ezetimibe Other (See Comments)   REACTION: MUSCLE PAIN   Diazepam Other (See Comments)   UNKNOWN, per pt   Penicillins Other (See Comments)   UNKNOWN, per pt      Medication List    STOP taking these medications   aspirin EC 81 MG tablet Replaced by: aspirin 81 MG chewable tablet   citalopram 10 MG tablet Commonly known as: CeleXA     TAKE these medications   acetaminophen 325 MG tablet Commonly known as: Tylenol Take 1-2 tablets (325-650 mg total) by mouth 2 (two) times daily as needed. What changed: reasons to take this   aspirin 81 MG chewable tablet Commonly known as:  Aspirin Childrens Chew 1 tablet (81 mg total) by mouth 2 (two) times daily with a meal. Replaces: aspirin EC 81 MG tablet   bisacodyl 5 MG EC tablet Commonly known as: DULCOLAX Take 1 tablet (5 mg total) by mouth daily as needed for moderate constipation.   docusate sodium 100 MG capsule Commonly known as: COLACE Take 1 capsule (100 mg total) by mouth 2 (two) times daily.   feeding supplement (ENSURE ENLIVE) Liqd Take 237 mLs by mouth 2 (two) times daily between meals.   HYDROcodone-acetaminophen 5-325 MG tablet Commonly known as: NORCO/VICODIN Take 1 tablet by mouth every 6 (six) hours as needed for moderate pain.   Melatonin 3 MG Tabs Take 3 mg by mouth at bedtime as needed (for sleep).   menthol-cetylpyridinium 3 MG lozenge Commonly known as: CEPACOL Take 1 lozenge (3 mg total) by mouth as needed for sore throat (sore throat).   methocarbamol 500 MG tablet Commonly known as: ROBAXIN Take 1 tablet (500 mg total) by mouth every 6 (six) hours as needed for muscle spasms.   MILK OF MAGNESIA PO Take 1 tablet by mouth daily.   nicotine 14 mg/24hr patch Commonly known as: NICODERM CQ - dosed in mg/24 hours Place 1 patch (14 mg total) onto the skin daily. Start taking on: November 30, 2018   omeprazole 20 MG capsule Commonly known as: PRILOSEC Take 1 capsule (20 mg total) by mouth daily.   ondansetron 4 MG tablet Commonly known as: ZOFRAN Take 1 tablet (4 mg total) by mouth every 6 (six) hours as needed for nausea.   QUEtiapine 25 MG tablet Commonly known as: SEROQUEL Take 1 tablet (25 mg total) by mouth at bedtime.   tamsulosin 0.4 MG Caps capsule Commonly known as: FLOMAX Take 1 capsule (0.4 mg total) by mouth daily.            Durable Medical Equipment  (From admission, onward)         Start     Ordered   11/29/18 1302  DME 3-in-1  Once     11/29/18 1303   11/29/18 0000  For home use only DME Other see comment    Comments: [employer] Lift per PT Reccs   Question:  Length of Need  Answer:  6 Months   11/29/18 1303   11/27/18 1527  For home use only DME Other see comment  Once    Comments: Harrel Lemon lift  Question:  Length of Need  Answer:  6 Months   11/27/18 1527         Follow-up Information    Swinteck, Aaron Edelman, MD. Schedule an appointment as soon as possible for a visit in 2 weeks.   Specialty: Orthopedic Surgery Why:  For wound re-check Contact information: [address] [place] [address] [phone]          Allergies  Allergen Reactions  . Warfarin And Related Other (See Comments)    Lost a lot of weight and also experienced numbness  . Atorvastatin Other (See Comments)    REACTION: MUSCLE PAIN  . Ezetimibe Other (See Comments)    REACTION: MUSCLE PAIN  . Diazepam Other (See Comments)    UNKNOWN, per pt  . Penicillins Other (See Comments)    UNKNOWN, per pt   Consultations:  Orthopedic Surgery  Discussed with Dr. Linus Salmons  Procedures/Studies: Dg Pelvis Portable  Result Date: 11/26/2018 CLINICAL DATA:  Displaced fracture of right femoral neck post operation. Hx of HTN. EXAM: PORTABLE PELVIS 1-2 VIEWS COMPARISON:  None. FINDINGS: RIGHT hip arthroplasty hardware appears appropriately positioned. Expected postsurgical changes within the overlying soft tissues. IMPRESSION: Status post RIGHT hip arthroplasty. No evidence of surgical complicating feature. Electronically Signed   By: Franki Cabot M.D.   On: 11/26/2018 17:34   Dg Pelvis Portable  Result Date: 11/25/2018 CLINICAL DATA:  Right femoral neck fracture. EXAM: PORTABLE PELVIS 1-2 VIEWS COMPARISON:  Radiographs for lumbar spine dated 10/09/2018 and CT scan of the abdomen dated 03/16/2015 FINDINGS: There is an impacted fracture of the right femoral neck. Left hip appears normal. Pelvic bones are normal. IMPRESSION: Impacted fracture of the right femoral neck. Electronically Signed   By: Lorriane Shire M.D.   On: 11/25/2018 13:50   Dg Chest Port 1  View  Result Date: 11/25/2018 CLINICAL DATA:  Right hip fracture. EXAM: PORTABLE CHEST 1 VIEW COMPARISON:  Chest x-rays dated 01/29/2018 and 12/18/2017 FINDINGS: Heart size and vascularity are normal. Lungs are clear. No effusions. Pacemaker in place. No acute bone abnormality. IMPRESSION: No active disease. Electronically Signed   By: Lorriane Shire M.D.   On: 11/25/2018 13:16   Dg Knee Right Port  Result Date: 11/25/2018 CLINICAL DATA:  RIGHT leg injury and pain.  RIGHT hip fracture. EXAM: PORTABLE RIGHT KNEE - 1-2 VIEW COMPARISON:  None. FINDINGS: No acute fracture or dislocation. No joint effusion. Chondrocalcinosis identified. No focal bony lesions are identified. IMPRESSION: No acute bony abnormality. Chondrocalcinosis. Electronically Signed   By: Margarette Canada M.D.   On: 11/25/2018 13:51   Dg C-arm 1-60 Min  Result Date: 11/26/2018 CLINICAL DATA:  Arthroplasty anterior approach. EXAM: OPERATIVE RIGHT HIP (WITH PELVIS IF PERFORMED) 2 VIEWS TECHNIQUE: Fluoroscopic spot image(s) were submitted for interpretation post-operatively. COMPARISON:  None. FINDINGS: Intraoperative fluoroscopic images demonstrating a RIGHT hip arthroplasty. Hardware appears appropriately positioned. Osseous alignment appears anatomic. Fluoroscopy provided for 12 seconds. IMPRESSION: Intraoperative fluoroscopic images demonstrating a RIGHT hip arthroplasty. Hardware appears appropriately positioned. No evidence of surgical complicating feature. Electronically Signed   By: Franki Cabot M.D.   On: 11/26/2018 16:18   Dg Hip Operative Unilat W Or W/o Pelvis Right  Result Date: 11/26/2018 CLINICAL DATA:  Arthroplasty anterior approach. EXAM: OPERATIVE RIGHT HIP (WITH PELVIS IF PERFORMED) 2 VIEWS TECHNIQUE: Fluoroscopic spot image(s) were submitted for interpretation post-operatively. COMPARISON:  None. FINDINGS: Intraoperative fluoroscopic images demonstrating a RIGHT hip arthroplasty. Hardware appears appropriately positioned.  Osseous alignment appears anatomic. Fluoroscopy provided for 12 seconds. IMPRESSION: Intraoperative fluoroscopic images demonstrating a RIGHT hip arthroplasty. Hardware appears appropriately positioned. No evidence of surgical complicating feature. Electronically Signed   By: Franki Cabot M.D.   On: 11/26/2018 16:18   Dg Hip Unilat With Pelvis 2-3 Views Right  Result Date: 11/25/2018  CLINICAL DATA:  Pt fell 2 days ago and is having right hip pain. Constant pain but hurts more when trying to stand and bend down. No previous injury or fx. History of back surgery. EXAM: DG HIP (WITH OR WITHOUT PELVIS) 2-3V RIGHT COMPARISON:  CT of the abdomen and pelvis on 03/16/2015 FINDINGS: There is an impacted subcapital femoral neck fracture of the RIGHT hip. No dislocation. Degenerative changes are seen in the LOWER spine. Remote lumbar spine fusion. IMPRESSION: Acute impacted subcapital femoral neck fracture. Electronically Signed   By: Nolon Nations M.D.   On: 11/25/2018 11:57     Subjective: Seen and examined at bedside and he was still somewhat confused but daughter confirmed that he is back to baseline.  No nausea or vomiting.  No other concerns or complaints this time and he was doing well per nursing.  He will be discharged and will need to follow-up with PCP and orthopedic surgery.  Discharge Exam: Vitals:   11/28/18 2042 11/29/18 0441  BP: (!) 171/93 (!) 144/88  Pulse: 91 98  Resp: 18 20  Temp: 99.4 F (37.4 C) 98.2 F (36.8 C)  SpO2: 97% 98%   Vitals:   11/28/18 0447 11/28/18 0820 11/28/18 2042 11/29/18 0441  BP: 128/67 137/69 (!) 171/93 (!) 144/88  Pulse: 82 92 91 98  Resp: 18 16 18 20   Temp: 98.6 F (37 C) 97.6 F (36.4 C) 99.4 F (37.4 C) 98.2 F (36.8 C)  TempSrc: Oral Oral Oral Oral  SpO2: 92% 97% 97% 98%  Weight:      Height:       General: Pt is alert, awake, not in acute distress Cardiovascular: Irregularly Irregular, S1/S2 +, no rubs, no gallops Respiratory: Diminished  bilaterally, no wheezing, no rhonchi Abdominal: Soft, NT, ND, bowel sounds + Extremities: Trace edema, no cyanosis  The results of significant diagnostics from this hospitalization (including imaging, microbiology, ancillary and laboratory) are listed below for reference.    Microbiology: Recent Results (from the past 240 hour(s))  SARS Coronavirus 2 (CEPHEID - Performed in [facility] hospital lab), Hosp Order     Status: None   Collection Time: 11/25/18  1:57 PM   Specimen: Nasopharyngeal Swab  Result Value Ref Range Status   SARS Coronavirus 2 NEGATIVE NEGATIVE Final    Comment: (NOTE) If result is NEGATIVE SARS-CoV-2 target nucleic acids are NOT DETECTED. The SARS-CoV-2 RNA is generally detectable in upper and lower  respiratory specimens during the acute phase of infection. The lowest  concentration of SARS-CoV-2 viral copies this assay can detect is 250  copies / mL. A negative result does not preclude SARS-CoV-2 infection  and should not be used as the sole basis for treatment or other  patient management decisions.  A negative result may occur with  improper specimen collection / handling, submission of specimen other  than nasopharyngeal swab, presence of viral mutation(s) within the  areas targeted by this assay, and inadequate number of viral copies  (<250 copies / mL). A negative result must be combined with clinical  observations, patient history, and epidemiological information. If result is POSITIVE SARS-CoV-2 target nucleic acids are DETECTED. The SARS-CoV-2 RNA is generally detectable in upper and lower  respiratory specimens dur ing the acute phase of infection.  Positive  results are indicative of active infection with SARS-CoV-2.  Clinical  correlation with patient history and other diagnostic information is  necessary to determine patient infection status.  Positive results do  not rule out bacterial  infection or co-infection with other viruses. If result is  PRESUMPTIVE POSTIVE SARS-CoV-2 nucleic acids MAY BE PRESENT.   A presumptive positive result was obtained on the submitted specimen  and confirmed on repeat testing.  While 2019 novel coronavirus  (SARS-CoV-2) nucleic acids may be present in the submitted sample  additional confirmatory testing may be necessary for epidemiological  and / or clinical management purposes  to differentiate between  SARS-CoV-2 and other Sarbecovirus currently known to infect humans.  If clinically indicated additional testing with an alternate test  methodology [phone]) is advised. The SARS-CoV-2 RNA is generally  detectable in upper and lower respiratory sp ecimens during the acute  phase of infection. The expected result is Negative. Fact Sheet for Patients:  StrictlyIdeas.no Fact Sheet for Healthcare Providers: BankingDealers.co.za This test is not yet approved or cleared by the [place] FDA and has been authorized for detection and/or diagnosis of SARS-CoV-2 by FDA under an Emergency Use Authorization (EUA).  This EUA will remain in effect (meaning this test can be used) for the duration of the COVID-19 declaration under Section 564(b)(1) of the Act, 21 U.S.C. section 360bbb-3(b)(1), unless the authorization is terminated or revoked sooner. Performed at [facility], [place] [address], [place], [place] [address]   Surgical pcr screen     Status: None   Collection Time: 11/25/18  4:43 PM   Specimen: Nasal Mucosa; Nasal Swab  Result Value Ref Range Status   MRSA, PCR NEGATIVE NEGATIVE Final   Staphylococcus aureus NEGATIVE NEGATIVE Final    Comment: (NOTE) The Xpert SA Assay (FDA approved for NASAL specimens in patients 53 years of age and older), is one component of a comprehensive surveillance program. It is not intended to diagnose infection nor to guide or monitor treatment. Performed at [facility], [place] [address]  [place], [place] [address]   Urine Culture     Status: Abnormal   Collection Time: 11/25/18  7:38 PM   Specimen: Urine, Clean Catch  Result Value Ref Range Status   Specimen Description URINE, CLEAN CATCH  Final   Special Requests   Final    NONE Performed at [facility], [place] [address] [place], [place] [address]    Culture >=100,000 COLONIES/mL STAPHYLOCOCCUS EPIDERMIDIS (A)  Final   Report Status 11/28/2018 FINAL  Final   Organism ID, Bacteria STAPHYLOCOCCUS EPIDERMIDIS (A)  Final      Susceptibility   Staphylococcus epidermidis - MIC*    CIPROFLOXACIN >=8 RESISTANT Resistant     GENTAMICIN <=0.5 SENSITIVE Sensitive     NITROFURANTOIN <=16 SENSITIVE Sensitive     OXACILLIN <=0.25 SENSITIVE Sensitive     TETRACYCLINE 4 SENSITIVE Sensitive     VANCOMYCIN 2 SENSITIVE Sensitive     TRIMETH/SULFA 160 RESISTANT Resistant     CLINDAMYCIN <=0.25 SENSITIVE Sensitive     RIFAMPIN <=0.5 SENSITIVE Sensitive     Inducible Clindamycin NEGATIVE Sensitive     * >=100,000 COLONIES/mL STAPHYLOCOCCUS EPIDERMIDIS    Labs: BNP (last 3 results) No results for input(s): BNP in the last 8760 hours. Basic Metabolic Panel: Recent Labs  Lab 11/25/18 1318 11/26/18 0723 11/27/18 0425 11/28/18 0340 11/29/18 0223  NA 139 138 139 137 134*  K 4.1 4.2 4.5 4.1 3.9  CL 106 106 105 103 101  CO2 23 25 24 25 25   GLUCOSE 131* 123* 130* 119* 124*  BUN 20 19 29* 31* 20  CREATININE 1.11 1.04 1.14 1.03 0.86  CALCIUM 9.5 9.1  8.9 8.5* 8.9  MG  --   --   --   --  1.7  PHOS  --   --   --   --  2.3*   Liver Function Tests: Recent Labs  Lab 11/29/18 0223  AST 29  ALT 14  ALKPHOS 51  BILITOT 1.2  PROT 6.3*  ALBUMIN 2.7*   No results for input(s): LIPASE, AMYLASE in the last 168 hours. No results for input(s): AMMONIA in the last 168 hours. CBC: Recent Labs  Lab 11/25/18 1318 11/26/18 0723 11/27/18 0425 11/28/18 0340 11/29/18 0223  WBC 16.5* 14.8* 15.9* 11.8* 11.7*  NEUTROABS 12.5*  --   --    --  6.4  HGB 15.0 14.2 13.2 12.0* 12.4*  HCT 45.1 42.1 40.8 36.0* 37.5*  MCV 86.9 85.9 87.9 86.5 86.8  PLT 202 176 202 188 215   Cardiac Enzymes: No results for input(s): CKTOTAL, CKMB, CKMBINDEX, TROPONINI in the last 168 hours. BNP: Invalid input(s): POCBNP CBG: No results for input(s): GLUCAP in the last 168 hours. D-Dimer No results for input(s): DDIMER in the last 72 hours. Hgb A1c No results for input(s): HGBA1C in the last 72 hours. Lipid Profile No results for input(s): CHOL, HDL, LDLCALC, TRIG, CHOLHDL, LDLDIRECT in the last 72 hours. Thyroid function studies No results for input(s): TSH, T4TOTAL, T3FREE, THYROIDAB in the last 72 hours.  Invalid input(s): FREET3 Anemia work up No results for input(s): VITAMINB12, FOLATE, FERRITIN, TIBC, IRON, RETICCTPCT in the last 72 hours. Urinalysis    Component Value Date/Time   COLORURINE YELLOW 11/25/2018 1357   APPEARANCEUR HAZY (A) 11/25/2018 1357   LABSPEC 1.023 11/25/2018 1357   PHURINE 6.0 11/25/2018 1357   GLUCOSEU NEGATIVE 11/25/2018 1357   HGBUR LARGE (A) 11/25/2018 1357   BILIRUBINUR NEGATIVE 11/25/2018 1357   BILIRUBINUR negative 10/09/2018 1233   KETONESUR NEGATIVE 11/25/2018 1357   PROTEINUR 100 (A) 11/25/2018 1357   UROBILINOGEN 1.0 10/09/2018 1233   UROBILINOGEN 1.0 12/29/2014 0816   NITRITE POSITIVE (A) 11/25/2018 1357   LEUKOCYTESUR NEGATIVE 11/25/2018 1357   Sepsis Labs Invalid input(s): PROCALCITONIN,  WBC,  LACTICIDVEN Microbiology Recent Results (from the past 240 hour(s))  SARS Coronavirus 2 (CEPHEID - Performed in [place] hospital lab), Hosp Order     Status: None   Collection Time: 11/25/18  1:57 PM   Specimen: Nasopharyngeal Swab  Result Value Ref Range Status   SARS Coronavirus 2 NEGATIVE NEGATIVE Final    Comment: (NOTE) If result is NEGATIVE SARS-CoV-2 target nucleic acids are NOT DETECTED. The SARS-CoV-2 RNA is generally detectable in upper and lower  respiratory specimens during  the acute phase of infection. The lowest  concentration of SARS-CoV-2 viral copies this assay can detect is 250  copies / mL. A negative result does not preclude SARS-CoV-2 infection  and should not be used as the sole basis for treatment or other  patient management decisions.  A negative result may occur with  improper specimen collection / handling, submission of specimen other  than nasopharyngeal swab, presence of viral mutation(s) within the  areas targeted by this assay, and inadequate number of viral copies  (<250 copies / mL). A negative result must be combined with clinical  observations, patient history, and epidemiological information. If result is POSITIVE SARS-CoV-2 target nucleic acids are DETECTED. The SARS-CoV-2 RNA is generally detectable in upper and lower  respiratory specimens dur ing the acute phase of infection.  Positive  results are indicative of active infection with SARS-CoV-2.  Clinical  correlation with patient history and other diagnostic information is  necessary to determine patient infection status.  Positive results do  not rule out bacterial infection or co-infection with other viruses. If result is PRESUMPTIVE POSTIVE SARS-CoV-2 nucleic acids MAY BE PRESENT.   A presumptive positive result was obtained on the submitted specimen  and confirmed on repeat testing.  While 2019 novel coronavirus  (SARS-CoV-2) nucleic acids may be present in the submitted sample  additional confirmatory testing may be necessary for epidemiological  and / or clinical management purposes  to differentiate between  SARS-CoV-2 and other Sarbecovirus currently known to infect humans.  If clinically indicated additional testing with an alternate test  methodology [phone]) is advised. The SARS-CoV-2 RNA is generally  detectable in upper and lower respiratory sp ecimens during the acute  phase of infection. The expected result is Negative. Fact Sheet for Patients:   StrictlyIdeas.no Fact Sheet for Healthcare Providers: BankingDealers.co.za This test is not yet approved or cleared by the [place] FDA and has been authorized for detection and/or diagnosis of SARS-CoV-2 by FDA under an Emergency Use Authorization (EUA).  This EUA will remain in effect (meaning this test can be used) for the duration of the COVID-19 declaration under Section 564(b)(1) of the Act, 21 U.S.C. section 360bbb-3(b)(1), unless the authorization is terminated or revoked sooner. Performed at [facility], [place] [address], [place], [place] [address]   Surgical pcr screen     Status: None   Collection Time: 11/25/18  4:43 PM   Specimen: Nasal Mucosa; Nasal Swab  Result Value Ref Range Status   MRSA, PCR NEGATIVE NEGATIVE Final   Staphylococcus aureus NEGATIVE NEGATIVE Final    Comment: (NOTE) The Xpert SA Assay (FDA approved for NASAL specimens in patients 41 years of age and older), is one component of a comprehensive surveillance program. It is not intended to diagnose infection nor to guide or monitor treatment. Performed at [facility], [place] [address] [place], [place] [address]   Urine Culture     Status: Abnormal   Collection Time: 11/25/18  7:38 PM   Specimen: Urine, Clean Catch  Result Value Ref Range Status   Specimen Description URINE, CLEAN CATCH  Final   Special Requests   Final    NONE Performed at [facility], [place] [address] [place], [place] [address]    Culture >=100,000 COLONIES/mL STAPHYLOCOCCUS EPIDERMIDIS (A)  Final   Report Status 11/28/2018 FINAL  Final   Organism ID, Bacteria STAPHYLOCOCCUS EPIDERMIDIS (A)  Final      Susceptibility   Staphylococcus epidermidis - MIC*    CIPROFLOXACIN >=8 RESISTANT Resistant     GENTAMICIN <=0.5 SENSITIVE Sensitive     NITROFURANTOIN <=16 SENSITIVE Sensitive     OXACILLIN <=0.25 SENSITIVE Sensitive     TETRACYCLINE 4 SENSITIVE Sensitive      VANCOMYCIN 2 SENSITIVE Sensitive     TRIMETH/SULFA 160 RESISTANT Resistant     CLINDAMYCIN <=0.25 SENSITIVE Sensitive     RIFAMPIN <=0.5 SENSITIVE Sensitive     Inducible Clindamycin NEGATIVE Sensitive     * >=100,000 COLONIES/mL STAPHYLOCOCCUS EPIDERMIDIS   Time coordinating discharge: 35 minutes  SIGNED:  Kerney Elbe, DO [facility] 11/29/2018, 1:04 PM Pager is on AMION  If 7PM-7AM, please contact night-coverage [email or web address] Password [email or web address]

## 2018-11-30 ENCOUNTER — Other Ambulatory Visit: Payer: Self-pay

## 2018-11-30 DIAGNOSIS — Z79891 Long term (current) use of opiate analgesic: Secondary | ICD-10-CM | POA: Diagnosis not present

## 2018-11-30 DIAGNOSIS — E876 Hypokalemia: Secondary | ICD-10-CM | POA: Diagnosis not present

## 2018-11-30 DIAGNOSIS — E871 Hypo-osmolality and hyponatremia: Secondary | ICD-10-CM | POA: Diagnosis not present

## 2018-11-30 DIAGNOSIS — K222 Esophageal obstruction: Secondary | ICD-10-CM | POA: Diagnosis not present

## 2018-11-30 DIAGNOSIS — K219 Gastro-esophageal reflux disease without esophagitis: Secondary | ICD-10-CM | POA: Diagnosis not present

## 2018-11-30 DIAGNOSIS — I4821 Permanent atrial fibrillation: Secondary | ICD-10-CM | POA: Diagnosis not present

## 2018-11-30 DIAGNOSIS — D72829 Elevated white blood cell count, unspecified: Secondary | ICD-10-CM | POA: Diagnosis not present

## 2018-11-30 DIAGNOSIS — S72001D Fracture of unspecified part of neck of right femur, subsequent encounter for closed fracture with routine healing: Secondary | ICD-10-CM | POA: Diagnosis not present

## 2018-11-30 DIAGNOSIS — I69354 Hemiplegia and hemiparesis following cerebral infarction affecting left non-dominant side: Secondary | ICD-10-CM | POA: Diagnosis not present

## 2018-11-30 DIAGNOSIS — I442 Atrioventricular block, complete: Secondary | ICD-10-CM | POA: Diagnosis not present

## 2018-11-30 DIAGNOSIS — J9819 Other pulmonary collapse: Secondary | ICD-10-CM | POA: Diagnosis not present

## 2018-11-30 DIAGNOSIS — K5900 Constipation, unspecified: Secondary | ICD-10-CM | POA: Diagnosis not present

## 2018-11-30 DIAGNOSIS — M150 Primary generalized (osteo)arthritis: Secondary | ICD-10-CM | POA: Diagnosis not present

## 2018-11-30 DIAGNOSIS — F0391 Unspecified dementia with behavioral disturbance: Secondary | ICD-10-CM | POA: Diagnosis not present

## 2018-11-30 DIAGNOSIS — Z7982 Long term (current) use of aspirin: Secondary | ICD-10-CM | POA: Diagnosis not present

## 2018-11-30 DIAGNOSIS — F419 Anxiety disorder, unspecified: Secondary | ICD-10-CM | POA: Diagnosis not present

## 2018-11-30 DIAGNOSIS — F05 Delirium due to known physiological condition: Secondary | ICD-10-CM | POA: Diagnosis not present

## 2018-11-30 DIAGNOSIS — M47817 Spondylosis without myelopathy or radiculopathy, lumbosacral region: Secondary | ICD-10-CM | POA: Diagnosis not present

## 2018-11-30 DIAGNOSIS — M11261 Other chondrocalcinosis, right knee: Secondary | ICD-10-CM | POA: Diagnosis not present

## 2018-11-30 DIAGNOSIS — D649 Anemia, unspecified: Secondary | ICD-10-CM | POA: Diagnosis not present

## 2018-11-30 DIAGNOSIS — R000 Tachycardia, unspecified: Secondary | ICD-10-CM | POA: Diagnosis not present

## 2018-11-30 DIAGNOSIS — K922 Gastrointestinal hemorrhage, unspecified: Secondary | ICD-10-CM | POA: Diagnosis not present

## 2018-11-30 DIAGNOSIS — R739 Hyperglycemia, unspecified: Secondary | ICD-10-CM | POA: Diagnosis not present

## 2018-11-30 DIAGNOSIS — E785 Hyperlipidemia, unspecified: Secondary | ICD-10-CM | POA: Diagnosis not present

## 2018-11-30 DIAGNOSIS — I119 Hypertensive heart disease without heart failure: Secondary | ICD-10-CM | POA: Diagnosis not present

## 2018-12-01 DIAGNOSIS — I4821 Permanent atrial fibrillation: Secondary | ICD-10-CM | POA: Diagnosis not present

## 2018-12-01 DIAGNOSIS — S72001D Fracture of unspecified part of neck of right femur, subsequent encounter for closed fracture with routine healing: Secondary | ICD-10-CM | POA: Diagnosis not present

## 2018-12-01 DIAGNOSIS — I69354 Hemiplegia and hemiparesis following cerebral infarction affecting left non-dominant side: Secondary | ICD-10-CM | POA: Diagnosis not present

## 2018-12-01 DIAGNOSIS — F0391 Unspecified dementia with behavioral disturbance: Secondary | ICD-10-CM | POA: Diagnosis not present

## 2018-12-01 DIAGNOSIS — I119 Hypertensive heart disease without heart failure: Secondary | ICD-10-CM | POA: Diagnosis not present

## 2018-12-01 DIAGNOSIS — M150 Primary generalized (osteo)arthritis: Secondary | ICD-10-CM | POA: Diagnosis not present

## 2018-12-03 ENCOUNTER — Telehealth: Payer: Self-pay

## 2018-12-03 DIAGNOSIS — R7989 Other specified abnormal findings of blood chemistry: Secondary | ICD-10-CM

## 2018-12-03 NOTE — Telephone Encounter (Signed)
Noted.  Is he going to be able to follow up here?  What is his status at home?  I saw the discharge summary.  Please let me know what I can do to help with his situation.  Thanks.

## 2018-12-03 NOTE — Telephone Encounter (Signed)
Tommi Emery OT with [facility] said that pts caregiver declined [facility] OT now due to pt being too weak. FYI to Dr Damita Dunnings.

## 2018-12-04 ENCOUNTER — Ambulatory Visit: Payer: Self-pay | Admitting: Adult Health

## 2018-12-04 DIAGNOSIS — S72001D Fracture of unspecified part of neck of right femur, subsequent encounter for closed fracture with routine healing: Secondary | ICD-10-CM | POA: Diagnosis not present

## 2018-12-04 DIAGNOSIS — I119 Hypertensive heart disease without heart failure: Secondary | ICD-10-CM | POA: Diagnosis not present

## 2018-12-04 DIAGNOSIS — I69354 Hemiplegia and hemiparesis following cerebral infarction affecting left non-dominant side: Secondary | ICD-10-CM | POA: Diagnosis not present

## 2018-12-04 DIAGNOSIS — M150 Primary generalized (osteo)arthritis: Secondary | ICD-10-CM | POA: Diagnosis not present

## 2018-12-04 DIAGNOSIS — I4821 Permanent atrial fibrillation: Secondary | ICD-10-CM | POA: Diagnosis not present

## 2018-12-04 DIAGNOSIS — F0391 Unspecified dementia with behavioral disturbance: Secondary | ICD-10-CM | POA: Diagnosis not present

## 2018-12-05 DIAGNOSIS — I4821 Permanent atrial fibrillation: Secondary | ICD-10-CM | POA: Diagnosis not present

## 2018-12-05 DIAGNOSIS — M150 Primary generalized (osteo)arthritis: Secondary | ICD-10-CM | POA: Diagnosis not present

## 2018-12-05 DIAGNOSIS — I119 Hypertensive heart disease without heart failure: Secondary | ICD-10-CM | POA: Diagnosis not present

## 2018-12-05 DIAGNOSIS — F0391 Unspecified dementia with behavioral disturbance: Secondary | ICD-10-CM | POA: Diagnosis not present

## 2018-12-05 DIAGNOSIS — S72001D Fracture of unspecified part of neck of right femur, subsequent encounter for closed fracture with routine healing: Secondary | ICD-10-CM | POA: Diagnosis not present

## 2018-12-05 DIAGNOSIS — I69354 Hemiplegia and hemiparesis following cerebral infarction affecting left non-dominant side: Secondary | ICD-10-CM | POA: Diagnosis not present

## 2018-12-05 NOTE — Telephone Encounter (Signed)
If he is doing well off of Seroquel then I would not restart it.  Update me as needed.  Follow-up in the middle of the month would be reasonable, after he sees surgery.  Reasonable to get follow-up labs done at office visit.  I put in the lab orders.  Thanks.

## 2018-12-05 NOTE — Telephone Encounter (Signed)
Spoke with [name], patient's daughter. She states that they will do OT when he is stronger maybe 2 weeks or so, this was suggested by Home health physical therapist that evaluated patient on 12/01/2018. Patient is scheduled to have [facility] PT 2 times a week for about 2 weeks. No one has come out yet but will come tonight and Friday. Also [facility] nurse aid is also has been working with patient on exercises that were left by the therapist after evaluation on 12/01/2018. Patient is doing better, he is able to walk with a walker some, able to feed himself. Patient can come to follow up here if needed. He has follow up with surgeon next week. Also daughter states patient was started on Seroquel at the hospital but he has not taking any since been home and daughter states doing ok without it. Wonders if it is ok to continue off the medication for now. He has not taking any sleep aid medications since been home. He had a good nights sleep last night. He has his moments but overall doing much better than before. When does patient need to follow up here?

## 2018-12-06 DIAGNOSIS — F0391 Unspecified dementia with behavioral disturbance: Secondary | ICD-10-CM | POA: Diagnosis not present

## 2018-12-06 DIAGNOSIS — M150 Primary generalized (osteo)arthritis: Secondary | ICD-10-CM | POA: Diagnosis not present

## 2018-12-06 DIAGNOSIS — I69354 Hemiplegia and hemiparesis following cerebral infarction affecting left non-dominant side: Secondary | ICD-10-CM | POA: Diagnosis not present

## 2018-12-06 DIAGNOSIS — S72001D Fracture of unspecified part of neck of right femur, subsequent encounter for closed fracture with routine healing: Secondary | ICD-10-CM | POA: Diagnosis not present

## 2018-12-06 DIAGNOSIS — I119 Hypertensive heart disease without heart failure: Secondary | ICD-10-CM | POA: Diagnosis not present

## 2018-12-06 DIAGNOSIS — I4821 Permanent atrial fibrillation: Secondary | ICD-10-CM | POA: Diagnosis not present

## 2018-12-06 NOTE — Telephone Encounter (Signed)
Spoke with [name] and advised as below. Appointment made.

## 2018-12-07 DIAGNOSIS — M150 Primary generalized (osteo)arthritis: Secondary | ICD-10-CM | POA: Diagnosis not present

## 2018-12-07 DIAGNOSIS — I69354 Hemiplegia and hemiparesis following cerebral infarction affecting left non-dominant side: Secondary | ICD-10-CM | POA: Diagnosis not present

## 2018-12-07 DIAGNOSIS — F0391 Unspecified dementia with behavioral disturbance: Secondary | ICD-10-CM | POA: Diagnosis not present

## 2018-12-07 DIAGNOSIS — I119 Hypertensive heart disease without heart failure: Secondary | ICD-10-CM | POA: Diagnosis not present

## 2018-12-07 DIAGNOSIS — S72001D Fracture of unspecified part of neck of right femur, subsequent encounter for closed fracture with routine healing: Secondary | ICD-10-CM | POA: Diagnosis not present

## 2018-12-07 DIAGNOSIS — I4821 Permanent atrial fibrillation: Secondary | ICD-10-CM | POA: Diagnosis not present

## 2018-12-11 DIAGNOSIS — S72031D Displaced midcervical fracture of right femur, subsequent encounter for closed fracture with routine healing: Secondary | ICD-10-CM | POA: Diagnosis not present

## 2018-12-12 DIAGNOSIS — Z950 Presence of cardiac pacemaker: Secondary | ICD-10-CM

## 2018-12-12 DIAGNOSIS — F05 Delirium due to known physiological condition: Secondary | ICD-10-CM | POA: Diagnosis not present

## 2018-12-12 DIAGNOSIS — I4821 Permanent atrial fibrillation: Secondary | ICD-10-CM

## 2018-12-12 DIAGNOSIS — Z9181 History of falling: Secondary | ICD-10-CM

## 2018-12-12 DIAGNOSIS — E871 Hypo-osmolality and hyponatremia: Secondary | ICD-10-CM

## 2018-12-12 DIAGNOSIS — I119 Hypertensive heart disease without heart failure: Secondary | ICD-10-CM

## 2018-12-12 DIAGNOSIS — I69354 Hemiplegia and hemiparesis following cerebral infarction affecting left non-dominant side: Secondary | ICD-10-CM

## 2018-12-12 DIAGNOSIS — K922 Gastrointestinal hemorrhage, unspecified: Secondary | ICD-10-CM

## 2018-12-12 DIAGNOSIS — S72001D Fracture of unspecified part of neck of right femur, subsequent encounter for closed fracture with routine healing: Secondary | ICD-10-CM

## 2018-12-12 DIAGNOSIS — J9819 Other pulmonary collapse: Secondary | ICD-10-CM

## 2018-12-12 DIAGNOSIS — Z1159 Encounter for screening for other viral diseases: Secondary | ICD-10-CM

## 2018-12-12 DIAGNOSIS — Z79891 Long term (current) use of opiate analgesic: Secondary | ICD-10-CM

## 2018-12-12 DIAGNOSIS — F0391 Unspecified dementia with behavioral disturbance: Secondary | ICD-10-CM

## 2018-12-12 DIAGNOSIS — K219 Gastro-esophageal reflux disease without esophagitis: Secondary | ICD-10-CM

## 2018-12-12 DIAGNOSIS — D649 Anemia, unspecified: Secondary | ICD-10-CM

## 2018-12-12 DIAGNOSIS — M47817 Spondylosis without myelopathy or radiculopathy, lumbosacral region: Secondary | ICD-10-CM

## 2018-12-12 DIAGNOSIS — Z7982 Long term (current) use of aspirin: Secondary | ICD-10-CM

## 2018-12-12 DIAGNOSIS — Z9861 Coronary angioplasty status: Secondary | ICD-10-CM

## 2018-12-12 DIAGNOSIS — I442 Atrioventricular block, complete: Secondary | ICD-10-CM | POA: Diagnosis not present

## 2018-12-12 DIAGNOSIS — M11261 Other chondrocalcinosis, right knee: Secondary | ICD-10-CM

## 2018-12-12 DIAGNOSIS — F419 Anxiety disorder, unspecified: Secondary | ICD-10-CM

## 2018-12-12 DIAGNOSIS — R000 Tachycardia, unspecified: Secondary | ICD-10-CM

## 2018-12-12 DIAGNOSIS — D72829 Elevated white blood cell count, unspecified: Secondary | ICD-10-CM

## 2018-12-12 DIAGNOSIS — Z981 Arthrodesis status: Secondary | ICD-10-CM

## 2018-12-12 DIAGNOSIS — K222 Esophageal obstruction: Secondary | ICD-10-CM

## 2018-12-12 DIAGNOSIS — M150 Primary generalized (osteo)arthritis: Secondary | ICD-10-CM

## 2018-12-12 DIAGNOSIS — E785 Hyperlipidemia, unspecified: Secondary | ICD-10-CM

## 2018-12-12 DIAGNOSIS — Z87891 Personal history of nicotine dependence: Secondary | ICD-10-CM

## 2018-12-12 DIAGNOSIS — K5900 Constipation, unspecified: Secondary | ICD-10-CM

## 2018-12-12 DIAGNOSIS — E876 Hypokalemia: Secondary | ICD-10-CM

## 2018-12-12 DIAGNOSIS — R739 Hyperglycemia, unspecified: Secondary | ICD-10-CM

## 2018-12-12 DIAGNOSIS — Z96641 Presence of right artificial hip joint: Secondary | ICD-10-CM

## 2018-12-13 ENCOUNTER — Telehealth: Payer: Self-pay | Admitting: Family Medicine

## 2018-12-13 DIAGNOSIS — F0391 Unspecified dementia with behavioral disturbance: Secondary | ICD-10-CM | POA: Diagnosis not present

## 2018-12-13 DIAGNOSIS — S72001D Fracture of unspecified part of neck of right femur, subsequent encounter for closed fracture with routine healing: Secondary | ICD-10-CM | POA: Diagnosis not present

## 2018-12-13 DIAGNOSIS — I4821 Permanent atrial fibrillation: Secondary | ICD-10-CM | POA: Diagnosis not present

## 2018-12-13 DIAGNOSIS — M150 Primary generalized (osteo)arthritis: Secondary | ICD-10-CM | POA: Diagnosis not present

## 2018-12-13 DIAGNOSIS — I119 Hypertensive heart disease without heart failure: Secondary | ICD-10-CM | POA: Diagnosis not present

## 2018-12-13 DIAGNOSIS — I69354 Hemiplegia and hemiparesis following cerebral infarction affecting left non-dominant side: Secondary | ICD-10-CM | POA: Diagnosis not present

## 2018-12-13 NOTE — Telephone Encounter (Signed)
Received a call from [facility] with [facility] - Pt has not been given his Seroquel for the last several weeks or more d/t family feeling like it makes him sleep too much. Inez Catalina wanted to make sure that Dr Damita Dunnings was aware.   Will send as FYI  Nothing further needed.

## 2018-12-13 NOTE — Telephone Encounter (Addendum)
Noted. Thanks.  Med/allergy list updated.

## 2018-12-14 DIAGNOSIS — I119 Hypertensive heart disease without heart failure: Secondary | ICD-10-CM | POA: Diagnosis not present

## 2018-12-14 DIAGNOSIS — S72001D Fracture of unspecified part of neck of right femur, subsequent encounter for closed fracture with routine healing: Secondary | ICD-10-CM | POA: Diagnosis not present

## 2018-12-14 DIAGNOSIS — F0391 Unspecified dementia with behavioral disturbance: Secondary | ICD-10-CM | POA: Diagnosis not present

## 2018-12-14 DIAGNOSIS — M150 Primary generalized (osteo)arthritis: Secondary | ICD-10-CM | POA: Diagnosis not present

## 2018-12-14 DIAGNOSIS — I4821 Permanent atrial fibrillation: Secondary | ICD-10-CM | POA: Diagnosis not present

## 2018-12-14 DIAGNOSIS — I69354 Hemiplegia and hemiparesis following cerebral infarction affecting left non-dominant side: Secondary | ICD-10-CM | POA: Diagnosis not present

## 2018-12-17 DIAGNOSIS — F0391 Unspecified dementia with behavioral disturbance: Secondary | ICD-10-CM | POA: Diagnosis not present

## 2018-12-17 DIAGNOSIS — M150 Primary generalized (osteo)arthritis: Secondary | ICD-10-CM | POA: Diagnosis not present

## 2018-12-17 DIAGNOSIS — S72001D Fracture of unspecified part of neck of right femur, subsequent encounter for closed fracture with routine healing: Secondary | ICD-10-CM | POA: Diagnosis not present

## 2018-12-17 DIAGNOSIS — I119 Hypertensive heart disease without heart failure: Secondary | ICD-10-CM | POA: Diagnosis not present

## 2018-12-17 DIAGNOSIS — I4821 Permanent atrial fibrillation: Secondary | ICD-10-CM | POA: Diagnosis not present

## 2018-12-17 DIAGNOSIS — I69354 Hemiplegia and hemiparesis following cerebral infarction affecting left non-dominant side: Secondary | ICD-10-CM | POA: Diagnosis not present

## 2018-12-18 ENCOUNTER — Inpatient Hospital Stay: Payer: Medicare Other | Admitting: Family Medicine

## 2018-12-21 DIAGNOSIS — I4821 Permanent atrial fibrillation: Secondary | ICD-10-CM | POA: Diagnosis not present

## 2018-12-21 DIAGNOSIS — I69354 Hemiplegia and hemiparesis following cerebral infarction affecting left non-dominant side: Secondary | ICD-10-CM | POA: Diagnosis not present

## 2018-12-21 DIAGNOSIS — S72001D Fracture of unspecified part of neck of right femur, subsequent encounter for closed fracture with routine healing: Secondary | ICD-10-CM | POA: Diagnosis not present

## 2018-12-21 DIAGNOSIS — M150 Primary generalized (osteo)arthritis: Secondary | ICD-10-CM | POA: Diagnosis not present

## 2018-12-21 DIAGNOSIS — I119 Hypertensive heart disease without heart failure: Secondary | ICD-10-CM | POA: Diagnosis not present

## 2018-12-21 DIAGNOSIS — F0391 Unspecified dementia with behavioral disturbance: Secondary | ICD-10-CM | POA: Diagnosis not present

## 2018-12-25 DIAGNOSIS — F0391 Unspecified dementia with behavioral disturbance: Secondary | ICD-10-CM | POA: Diagnosis not present

## 2018-12-25 DIAGNOSIS — I119 Hypertensive heart disease without heart failure: Secondary | ICD-10-CM | POA: Diagnosis not present

## 2018-12-25 DIAGNOSIS — I69354 Hemiplegia and hemiparesis following cerebral infarction affecting left non-dominant side: Secondary | ICD-10-CM | POA: Diagnosis not present

## 2018-12-25 DIAGNOSIS — I4821 Permanent atrial fibrillation: Secondary | ICD-10-CM | POA: Diagnosis not present

## 2018-12-25 DIAGNOSIS — M150 Primary generalized (osteo)arthritis: Secondary | ICD-10-CM | POA: Diagnosis not present

## 2018-12-25 DIAGNOSIS — S72001D Fracture of unspecified part of neck of right femur, subsequent encounter for closed fracture with routine healing: Secondary | ICD-10-CM | POA: Diagnosis not present

## 2018-12-27 DIAGNOSIS — M150 Primary generalized (osteo)arthritis: Secondary | ICD-10-CM | POA: Diagnosis not present

## 2018-12-27 DIAGNOSIS — I119 Hypertensive heart disease without heart failure: Secondary | ICD-10-CM | POA: Diagnosis not present

## 2018-12-27 DIAGNOSIS — F0391 Unspecified dementia with behavioral disturbance: Secondary | ICD-10-CM | POA: Diagnosis not present

## 2018-12-27 DIAGNOSIS — I69354 Hemiplegia and hemiparesis following cerebral infarction affecting left non-dominant side: Secondary | ICD-10-CM | POA: Diagnosis not present

## 2018-12-27 DIAGNOSIS — I4821 Permanent atrial fibrillation: Secondary | ICD-10-CM | POA: Diagnosis not present

## 2018-12-27 DIAGNOSIS — S72001D Fracture of unspecified part of neck of right femur, subsequent encounter for closed fracture with routine healing: Secondary | ICD-10-CM | POA: Diagnosis not present

## 2018-12-28 ENCOUNTER — Encounter: Payer: Self-pay | Admitting: Family Medicine

## 2018-12-28 ENCOUNTER — Ambulatory Visit (INDEPENDENT_AMBULATORY_CARE_PROVIDER_SITE_OTHER): Payer: Medicare Other | Admitting: Family Medicine

## 2018-12-28 ENCOUNTER — Other Ambulatory Visit: Payer: Self-pay

## 2018-12-28 VITALS — BP 102/60 | HR 88 | Temp 98.4°F | Ht 68.0 in | Wt 155.6 lb

## 2018-12-28 DIAGNOSIS — S72001D Fracture of unspecified part of neck of right femur, subsequent encounter for closed fracture with routine healing: Secondary | ICD-10-CM | POA: Diagnosis not present

## 2018-12-28 DIAGNOSIS — I69354 Hemiplegia and hemiparesis following cerebral infarction affecting left non-dominant side: Secondary | ICD-10-CM | POA: Diagnosis not present

## 2018-12-28 DIAGNOSIS — S72001A Fracture of unspecified part of neck of right femur, initial encounter for closed fracture: Secondary | ICD-10-CM

## 2018-12-28 DIAGNOSIS — I63311 Cerebral infarction due to thrombosis of right middle cerebral artery: Secondary | ICD-10-CM | POA: Diagnosis not present

## 2018-12-28 DIAGNOSIS — F01518 Vascular dementia, unspecified severity, with other behavioral disturbance: Secondary | ICD-10-CM

## 2018-12-28 DIAGNOSIS — F0391 Unspecified dementia with behavioral disturbance: Secondary | ICD-10-CM | POA: Diagnosis not present

## 2018-12-28 DIAGNOSIS — M150 Primary generalized (osteo)arthritis: Secondary | ICD-10-CM | POA: Diagnosis not present

## 2018-12-28 DIAGNOSIS — K5900 Constipation, unspecified: Secondary | ICD-10-CM

## 2018-12-28 DIAGNOSIS — I119 Hypertensive heart disease without heart failure: Secondary | ICD-10-CM | POA: Diagnosis not present

## 2018-12-28 DIAGNOSIS — I4821 Permanent atrial fibrillation: Secondary | ICD-10-CM | POA: Diagnosis not present

## 2018-12-28 DIAGNOSIS — F0151 Vascular dementia with behavioral disturbance: Secondary | ICD-10-CM | POA: Diagnosis not present

## 2018-12-28 DIAGNOSIS — R7989 Other specified abnormal findings of blood chemistry: Secondary | ICD-10-CM | POA: Diagnosis not present

## 2018-12-28 LAB — CBC WITH DIFFERENTIAL/PLATELET
Basophils Absolute: 0.1 10*3/uL (ref 0.0–0.1)
Basophils Relative: 0.6 % (ref 0.0–3.0)
Eosinophils Absolute: 0.4 10*3/uL (ref 0.0–0.7)
Eosinophils Relative: 5 % (ref 0.0–5.0)
HCT: 39.8 % (ref 39.0–52.0)
Hemoglobin: 13.2 g/dL (ref 13.0–17.0)
Lymphocytes Relative: 23.3 % (ref 12.0–46.0)
Lymphs Abs: 2.1 10*3/uL (ref 0.7–4.0)
MCHC: 33.1 g/dL (ref 30.0–36.0)
MCV: 87.7 fl (ref 78.0–100.0)
Monocytes Absolute: 0.7 10*3/uL (ref 0.1–1.0)
Monocytes Relative: 8.2 % (ref 3.0–12.0)
Neutro Abs: 5.7 10*3/uL (ref 1.4–7.7)
Neutrophils Relative %: 62.9 % (ref 43.0–77.0)
Platelets: 272 10*3/uL (ref 150.0–400.0)
RBC: 4.54 Mil/uL (ref 4.22–5.81)
RDW: 14.7 % (ref 11.5–15.5)
WBC: 9 10*3/uL (ref 4.0–10.5)

## 2018-12-28 LAB — COMPREHENSIVE METABOLIC PANEL
ALT: 30 U/L (ref 0–53)
AST: 27 U/L (ref 0–37)
Albumin: 3.5 g/dL (ref 3.5–5.2)
Alkaline Phosphatase: 104 U/L (ref 39–117)
BUN: 24 mg/dL — ABNORMAL HIGH (ref 6–23)
CO2: 31 mEq/L (ref 19–32)
Calcium: 9.7 mg/dL (ref 8.4–10.5)
Chloride: 101 mEq/L (ref 96–112)
Creatinine, Ser: 1.11 mg/dL (ref 0.40–1.50)
GFR: 62.52 mL/min (ref >60.00)
Glucose, Bld: 106 mg/dL — ABNORMAL HIGH (ref 70–99)
Potassium: 4.6 mEq/L (ref 3.5–5.1)
Sodium: 138 mEq/L (ref 135–145)
Total Bilirubin: 0.6 mg/dL (ref 0.2–1.2)
Total Protein: 7.3 g/dL (ref 6.0–8.3)

## 2018-12-28 LAB — MAGNESIUM: Magnesium: 2.1 mg/dL (ref 1.5–2.5)

## 2018-12-28 LAB — PHOSPHORUS: Phosphorus: 3.5 mg/dL (ref 2.3–4.6)

## 2018-12-28 MED ORDER — QUETIAPINE FUMARATE 25 MG PO TABS: 12.5000 mg | ORAL_TABLET | Freq: Every day | ORAL | Status: DC

## 2018-12-28 MED ORDER — QUETIAPINE FUMARATE 25 MG PO TABS: 12.5000 mg | ORAL_TABLET | Freq: Every day | ORAL | 1 refills | Status: DC

## 2018-12-28 MED ORDER — ASPIRIN 81 MG PO CHEW: 81.0000 mg | CHEWABLE_TABLET | Freq: Every day | ORAL | Status: AC

## 2018-12-28 NOTE — Patient Instructions (Addendum)
If 48 hours without a BM, then start taking MOM daily as needed.  Go to the lab on the way out.  We'll contact you with your lab report. Restart 1/2 tab of seroquel and update me about how that goes.  Take care.  Glad to see you.

## 2018-12-28 NOTE — Progress Notes (Signed)
Inpatient follow-up after right femoral neck fracture.  Inpatient course discussed with patient and daughter.  Still doing PT at home, once a week.    Since patient has been home he has had variable BMs with need for recent enema. miralax didn't help.  Milk of magnesia seem to help more than anything else.  He has variable sleep patterns and mentation and can get fussy/agitated.  He can get disoriented at baseline.  Family supportive at home.  We talked about prev seroquel use and warnings.   He is less verbal at baseline, except for occ inc episodes of talking.  No fevers.     His family has procured an old adjustable bed but the lift mechanism is broken and they were asking about how to get that fixed as it may be more comfortable for him with repositioning given the hip fracture.  I told them I would check on resources for this.  He is due for follow-up labs.  See notes on labs.  PMH and SH reviewed  ROS: Per HPI unless specifically indicated in ROS section   Meds, vitals, and allergies reviewed.   GEN: nad, alert, minimally verbal.  He does not track and try to shake my hand.  He speaks a few words.  Elderly gentleman in wheelchair. HEENT: ncat NECK: supple w/o LA CV: rrr. PULM: ctab, no inc wob ABD: soft, +bs EXT: trace BLE edema SKIN: well perfused

## 2018-12-30 ENCOUNTER — Encounter: Payer: Self-pay | Admitting: Family Medicine

## 2018-12-30 DIAGNOSIS — Z7982 Long term (current) use of aspirin: Secondary | ICD-10-CM | POA: Diagnosis not present

## 2018-12-30 DIAGNOSIS — M47817 Spondylosis without myelopathy or radiculopathy, lumbosacral region: Secondary | ICD-10-CM | POA: Diagnosis not present

## 2018-12-30 DIAGNOSIS — Z79891 Long term (current) use of opiate analgesic: Secondary | ICD-10-CM | POA: Diagnosis not present

## 2018-12-30 DIAGNOSIS — I442 Atrioventricular block, complete: Secondary | ICD-10-CM | POA: Diagnosis not present

## 2018-12-30 DIAGNOSIS — I119 Hypertensive heart disease without heart failure: Secondary | ICD-10-CM | POA: Diagnosis not present

## 2018-12-30 DIAGNOSIS — R739 Hyperglycemia, unspecified: Secondary | ICD-10-CM | POA: Diagnosis not present

## 2018-12-30 DIAGNOSIS — M150 Primary generalized (osteo)arthritis: Secondary | ICD-10-CM | POA: Diagnosis not present

## 2018-12-30 DIAGNOSIS — M11261 Other chondrocalcinosis, right knee: Secondary | ICD-10-CM | POA: Diagnosis not present

## 2018-12-30 DIAGNOSIS — R000 Tachycardia, unspecified: Secondary | ICD-10-CM | POA: Diagnosis not present

## 2018-12-30 DIAGNOSIS — D72829 Elevated white blood cell count, unspecified: Secondary | ICD-10-CM | POA: Diagnosis not present

## 2018-12-30 DIAGNOSIS — J9819 Other pulmonary collapse: Secondary | ICD-10-CM | POA: Diagnosis not present

## 2018-12-30 DIAGNOSIS — E871 Hypo-osmolality and hyponatremia: Secondary | ICD-10-CM | POA: Diagnosis not present

## 2018-12-30 DIAGNOSIS — I4821 Permanent atrial fibrillation: Secondary | ICD-10-CM | POA: Diagnosis not present

## 2018-12-30 DIAGNOSIS — F419 Anxiety disorder, unspecified: Secondary | ICD-10-CM | POA: Diagnosis not present

## 2018-12-30 DIAGNOSIS — F05 Delirium due to known physiological condition: Secondary | ICD-10-CM | POA: Diagnosis not present

## 2018-12-30 DIAGNOSIS — I69354 Hemiplegia and hemiparesis following cerebral infarction affecting left non-dominant side: Secondary | ICD-10-CM | POA: Diagnosis not present

## 2018-12-30 DIAGNOSIS — E785 Hyperlipidemia, unspecified: Secondary | ICD-10-CM | POA: Diagnosis not present

## 2018-12-30 DIAGNOSIS — K219 Gastro-esophageal reflux disease without esophagitis: Secondary | ICD-10-CM | POA: Diagnosis not present

## 2018-12-30 DIAGNOSIS — E876 Hypokalemia: Secondary | ICD-10-CM | POA: Diagnosis not present

## 2018-12-30 DIAGNOSIS — S72001D Fracture of unspecified part of neck of right femur, subsequent encounter for closed fracture with routine healing: Secondary | ICD-10-CM | POA: Diagnosis not present

## 2018-12-30 DIAGNOSIS — D649 Anemia, unspecified: Secondary | ICD-10-CM | POA: Diagnosis not present

## 2018-12-30 DIAGNOSIS — K222 Esophageal obstruction: Secondary | ICD-10-CM | POA: Diagnosis not present

## 2018-12-30 DIAGNOSIS — K922 Gastrointestinal hemorrhage, unspecified: Secondary | ICD-10-CM | POA: Diagnosis not present

## 2018-12-30 DIAGNOSIS — K5900 Constipation, unspecified: Secondary | ICD-10-CM | POA: Diagnosis not present

## 2018-12-30 DIAGNOSIS — F0391 Unspecified dementia with behavioral disturbance: Secondary | ICD-10-CM | POA: Diagnosis not present

## 2018-12-30 NOTE — Assessment & Plan Note (Signed)
We talked about prev seroquel use and warnings.  He may benefit from a low-dose of Seroquel, to help with sleep regulation and to keep from getting as fussy and agitated.  It may make his days and his nights better.  We talked about the cautions and the warnings.  His daughter agreed with all this.  They can try half a tablet and then update me as needed.  See notes on labs.  >25 minutes spent in face to face time with patient, >50% spent in counselling or coordination of care.

## 2018-12-30 NOTE — Assessment & Plan Note (Signed)
If 48 hours without a BM, then start taking MOM daily as needed.  Update me as needed.  MiraLAX did not help.

## 2018-12-30 NOTE — Assessment & Plan Note (Signed)
His family has procured an old adjustable bed but the lift mechanism is broken and they were asking about how to get that fixed as it may be more comfortable for him with repositioning given the hip fracture.  I told them I would check on resources for this.

## 2019-01-01 ENCOUNTER — Telehealth: Payer: Self-pay | Admitting: Family Medicine

## 2019-01-01 NOTE — Telephone Encounter (Signed)
Kendrick Fries spoke with [name] daughter , see results note

## 2019-01-01 NOTE — Telephone Encounter (Signed)
Dakota [name] returned your call Best number [phone]

## 2019-01-02 DIAGNOSIS — F0391 Unspecified dementia with behavioral disturbance: Secondary | ICD-10-CM | POA: Diagnosis not present

## 2019-01-02 DIAGNOSIS — M150 Primary generalized (osteo)arthritis: Secondary | ICD-10-CM | POA: Diagnosis not present

## 2019-01-02 DIAGNOSIS — I69354 Hemiplegia and hemiparesis following cerebral infarction affecting left non-dominant side: Secondary | ICD-10-CM | POA: Diagnosis not present

## 2019-01-02 DIAGNOSIS — I4821 Permanent atrial fibrillation: Secondary | ICD-10-CM | POA: Diagnosis not present

## 2019-01-02 DIAGNOSIS — I119 Hypertensive heart disease without heart failure: Secondary | ICD-10-CM | POA: Diagnosis not present

## 2019-01-02 DIAGNOSIS — S72001D Fracture of unspecified part of neck of right femur, subsequent encounter for closed fracture with routine healing: Secondary | ICD-10-CM | POA: Diagnosis not present

## 2019-01-03 ENCOUNTER — Other Ambulatory Visit: Payer: Self-pay | Admitting: Family Medicine

## 2019-01-03 MED ORDER — LANSOPRAZOLE 15 MG PO CPDR: 15.0000 mg | DELAYED_RELEASE_CAPSULE | Freq: Every day | ORAL | Status: DC

## 2019-01-03 MED ORDER — CITALOPRAM HYDROBROMIDE 10 MG PO TABS: 10.0000 mg | ORAL_TABLET | Freq: Every day | ORAL | 1 refills | Status: DC

## 2019-01-03 MED ORDER — CITALOPRAM HYDROBROMIDE 10 MG PO TABS: 10.0000 mg | ORAL_TABLET | Freq: Every day | ORAL | Status: DC

## 2019-01-03 MED ORDER — LANSOPRAZOLE 15 MG PO CPDR: 15.0000 mg | DELAYED_RELEASE_CAPSULE | Freq: Every day | ORAL | 1 refills | Status: AC

## 2019-01-03 NOTE — Progress Notes (Signed)
Called daughter.  See prev result note.  Discussed options.   Would stop prilosec, start prevacid.   Would restart citalopram 10mg  in the AM.   Continue seroquel at night, 12.5mg  qhs.   She'll update me.  She thanked me for the call.

## 2019-01-04 ENCOUNTER — Telehealth: Payer: Self-pay
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
## 2019-01-04 NOTE — Telephone Encounter (Signed)
Noted. Thanks.
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
## 2019-01-04 NOTE — Telephone Encounter (Signed)
I apologize the cut above the eye does not appear infected per pts daughter [name].

## 2019-01-08 DIAGNOSIS — S72031D Displaced midcervical fracture of right femur, subsequent encounter for closed fracture with routine healing: Secondary | ICD-10-CM | POA: Diagnosis not present

## 2019-01-09 DIAGNOSIS — I69354 Hemiplegia and hemiparesis following cerebral infarction affecting left non-dominant side: Secondary | ICD-10-CM | POA: Diagnosis not present

## 2019-01-09 DIAGNOSIS — I4821 Permanent atrial fibrillation: Secondary | ICD-10-CM | POA: Diagnosis not present

## 2019-01-09 DIAGNOSIS — F0391 Unspecified dementia with behavioral disturbance: Secondary | ICD-10-CM | POA: Diagnosis not present

## 2019-01-09 DIAGNOSIS — I119 Hypertensive heart disease without heart failure: Secondary | ICD-10-CM | POA: Diagnosis not present

## 2019-01-09 DIAGNOSIS — M150 Primary generalized (osteo)arthritis: Secondary | ICD-10-CM | POA: Diagnosis not present

## 2019-01-09 DIAGNOSIS — S72001D Fracture of unspecified part of neck of right femur, subsequent encounter for closed fracture with routine healing: Secondary | ICD-10-CM | POA: Diagnosis not present

## 2019-01-11 ENCOUNTER — Telehealth: Payer: Self-pay

## 2019-01-11 NOTE — Telephone Encounter (Signed)
Copied from [place] [phone]. Topic: Referral - Status >> Jan 11, 2019 [id number] AM Simone Curia D wrote: [id number] Spoke with patient's daughter [name] per [facility] regarding repair to the patient's bed.  Medicare will cover this gave information for [facility] accepts Medicare assignment. Will follow up next week. Ambrose Mantle [phone]

## 2019-01-14 DIAGNOSIS — I119 Hypertensive heart disease without heart failure: Secondary | ICD-10-CM | POA: Diagnosis not present

## 2019-01-14 DIAGNOSIS — M150 Primary generalized (osteo)arthritis: Secondary | ICD-10-CM | POA: Diagnosis not present

## 2019-01-14 DIAGNOSIS — I4821 Permanent atrial fibrillation: Secondary | ICD-10-CM | POA: Diagnosis not present

## 2019-01-14 DIAGNOSIS — S72001D Fracture of unspecified part of neck of right femur, subsequent encounter for closed fracture with routine healing: Secondary | ICD-10-CM | POA: Diagnosis not present

## 2019-01-14 DIAGNOSIS — I69354 Hemiplegia and hemiparesis following cerebral infarction affecting left non-dominant side: Secondary | ICD-10-CM | POA: Diagnosis not present

## 2019-01-14 DIAGNOSIS — F0391 Unspecified dementia with behavioral disturbance: Secondary | ICD-10-CM | POA: Diagnosis not present

## 2019-01-17 ENCOUNTER — Telehealth: Payer: Self-pay

## 2019-01-17 NOTE — Telephone Encounter (Signed)
Copied from [place] [id number]. Topic: Referral - Status >> Jan 17, 2019  [id number] PM Simone Curia D wrote: [id number] Followed up with [name] Hillman/daughter per [facility] about repairs for patient's bed. N Part for bed would be $900 which is more than was paid for the bed and Medicare will not cover it since it was not purchased through them.  [name] stated that her brother may be able to get another bed. Ambrose Mantle [phone]

## 2019-01-21 DIAGNOSIS — I69354 Hemiplegia and hemiparesis following cerebral infarction affecting left non-dominant side: Secondary | ICD-10-CM | POA: Diagnosis not present

## 2019-01-21 DIAGNOSIS — I4821 Permanent atrial fibrillation: Secondary | ICD-10-CM | POA: Diagnosis not present

## 2019-01-21 DIAGNOSIS — S72001D Fracture of unspecified part of neck of right femur, subsequent encounter for closed fracture with routine healing: Secondary | ICD-10-CM | POA: Diagnosis not present

## 2019-01-21 DIAGNOSIS — M150 Primary generalized (osteo)arthritis: Secondary | ICD-10-CM | POA: Diagnosis not present

## 2019-01-21 DIAGNOSIS — I119 Hypertensive heart disease without heart failure: Secondary | ICD-10-CM | POA: Diagnosis not present

## 2019-01-21 DIAGNOSIS — F0391 Unspecified dementia with behavioral disturbance: Secondary | ICD-10-CM | POA: Diagnosis not present

## 2019-02-12 ENCOUNTER — Ambulatory Visit (INDEPENDENT_AMBULATORY_CARE_PROVIDER_SITE_OTHER): Payer: Medicare Other | Admitting: *Deleted

## 2019-02-12 DIAGNOSIS — I63311 Cerebral infarction due to thrombosis of right middle cerebral artery: Secondary | ICD-10-CM | POA: Diagnosis not present

## 2019-02-12 DIAGNOSIS — I4891 Unspecified atrial fibrillation: Secondary | ICD-10-CM

## 2019-02-12 LAB — CUP PACEART REMOTE DEVICE CHECK
Battery Impedance: 1322 Ohm
Battery Remaining Longevity: 55 mo
Battery Voltage: 2.75 V
Brady Statistic RV Percent Paced: 18 %
Date Time Interrogation Session: 20201013121645
Implantable Lead Implant Date: 20160318
Implantable Lead Location: 753860
Implantable Lead Model: 5076
Implantable Pulse Generator Implant Date: 20160318
Lead Channel Impedance Value: 0 Ohm
Lead Channel Impedance Value: 457 Ohm
Lead Channel Pacing Threshold Amplitude: 0.75 V
Lead Channel Pacing Threshold Pulse Width: 0.4 ms
Lead Channel Setting Pacing Amplitude: 2.5 V
Lead Channel Setting Pacing Pulse Width: 0.4 ms
Lead Channel Setting Sensing Sensitivity: 2 mV

## 2019-02-25 NOTE — Progress Notes (Signed)
Remote pacemaker transmission.   

## 2019-03-04 ENCOUNTER — Other Ambulatory Visit: Payer: Self-pay | Admitting: Family Medicine

## 2019-03-05 ENCOUNTER — Encounter: Payer: Self-pay | Admitting: Family Medicine

## 2019-03-05 ENCOUNTER — Ambulatory Visit: Payer: Medicare Other | Admitting: Cardiovascular Disease

## 2019-03-07 ENCOUNTER — Telehealth: Payer: Self-pay | Admitting: Family Medicine

## 2019-03-07 ENCOUNTER — Other Ambulatory Visit: Payer: Self-pay | Admitting: Family Medicine

## 2019-03-07 MED ORDER — QUETIAPINE FUMARATE 25 MG PO TABS: 12.5000 mg | ORAL_TABLET | Freq: Every day | ORAL | 5 refills | Status: DC

## 2019-03-07 MED ORDER — QUETIAPINE FUMARATE 25 MG PO TABS: 12.5000 mg | ORAL_TABLET | Freq: Every day | ORAL | Status: DC
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
## 2019-03-07 NOTE — Telephone Encounter (Signed)
Called Ann back.  Discussed options.  I don't seen an obvious acute cause/issue to treat at this point.  I think it makes sense to try to increase the seroquel slowly.  They can alternate 12.5 vs 25mg  at night and update me as needed.  She agrees. Rx sent.

## 2019-03-07 NOTE — Telephone Encounter (Signed)
I spoke with Dakota [name] does not have access to my chart now and Dakota [name] notified as instructed and voiced understanding and she said pt aches all over on and off in his muscles and complains intermittently with rt hip pain; pt had surgery on rt hip 10/2018. Pt does have red, itchy rash on back for 1 month(no blisters). Dakota [name] thinks due to too much soap in pts clothes. Dakota [name] has told caregiver to use 1 pod of detergent but continues to use 5 pods per washing load. Pt has generalized weakness and fatigue. Pt has constipation; pt taking colace and mineral oil q 2-3 days. Last large BM was 03/02/19.no urinary problems except Dakota [name] does not think pt is emptying his bladder when urinating. Urine volume is not as much as used to be. Pt continues to take tamsulosin.  [facility]. Dakota [name] request cb after Dr Damita Dunnings reviews note.

## 2019-03-07 NOTE — Telephone Encounter (Signed)
Ann left v/m(DPR signed) that [facility] needs new script for Seroquel since instructions were changed.

## 2019-03-07 NOTE — Telephone Encounter (Signed)
Noted. Thanks.

## 2019-03-07 NOTE — Telephone Encounter (Signed)
Please see continuation on 03/05/19 pt message.

## 2019-03-08 ENCOUNTER — Other Ambulatory Visit: Payer: Self-pay | Admitting: *Deleted

## 2019-03-29 ENCOUNTER — Other Ambulatory Visit: Payer: Self-pay | Admitting: Family Medicine

## 2019-04-09 NOTE — Progress Notes (Signed)
Patient ID: Dakota [name], male   DOB: January 19, 1929, 85 y.o.   MRN: [id number]     Cardiology Office Note   Date:  04/11/2019   ID:  Dakota [name], DOB 09-Apr-1929, MRN [id number]  PCP:  Tonia Ghent, MD  Cardiologist:  Dr [name], MD   No chief complaint on file.   History of Present Illness: Dakota [name] is seen today for f/U of afib and HTN. Marland KitchenHe has chronic afib and has refused to be on coumadin. Forutuanatly he has not had a CVA, TIA or embolic event. His BP is under good control.Reviewed multiple pages of home readings Discussed Pradaxa with him and he still wishes to be just on asa   07/26/14 D/C after SEMI with PDA occlusion.  Symptomatic CHB with pacer implant  07/18/14  Echo reviewed EF 55% no significant valve disease   Seen by PA 08/13/14   for evaluation of paresthesias of LE, weakness, and failure to increase his activity. Plavix stopped that visit Much better off this med   Discussed issues with previous statin Long time ago indicates losing weight and weakness details not clear   01/06/16 EGD to remove food impaction with esophagitis and Schatzki ring    Had large stroke right MCA August 2019  and some GI bleeding Then fell and had right  Femoral neck fracture operated on November 26 2018  Has had mood issues followed by Dr Damita Dunnings now on Seroquel and Citalopram   No cardiac complaints      Past Medical History:  Diagnosis Date  . Anxiety   . Back pain, chronic   . Complete heart block ([place]) 07/18/2014   Medtronic [place] model [id number] (serial number [id number])  singe lead PPM  . Esophageal obstruction due to food impaction 2017  . GERD (gastroesophageal reflux disease) 10/2002  . Hyperlipidemia 12/1994  . Hypertension   . Hypokalemia   . Lung collapse 03/1991   Fall  . Permanent atrial fibrillation ([name])    Refused coumadin therapy  . Stroke [facility])    Past Surgical History:  Procedure Laterality Date  . BACK SURGERY  11/91   with hardware fixation  .  BIOPSY  11/27/2017   Procedure: BIOPSY;  Surgeon: Laurence Spates, MD;  Location: [place];  Service: Endoscopy;;  . CARDIAC CATHETERIZATION  06/2004   30 % stenosis, EF normal  . ESOPHAGOGASTRODUODENOSCOPY N/A 01/06/2016   Procedure: ESOPHAGOGASTRODUODENOSCOPY (EGD);  Surgeon: Otis Brace, MD;  Location: [place];  Service: Gastroenterology;  Laterality: N/A;  . ESOPHAGOGASTRODUODENOSCOPY N/A 11/27/2017   Procedure: ESOPHAGOGASTRODUODENOSCOPY (EGD);  Surgeon: Laurence Spates, MD;  Location[facility] ENDOSCOPY;  Service: Endoscopy;  Laterality: N/A;  . ESOPHAGOGASTRODUODENOSCOPY (EGD) WITH PROPOFOL N/A 01/29/2018   Procedure: ESOPHAGOGASTRODUODENOSCOPY (EGD) WITH PROPOFOL;  Surgeon: Laurence Spates, MD;  Location: [place];  Service: Endoscopy;  Laterality: N/A;  . EYE SURGERY     Retinal bubble surgery, cataract   . FOREIGN BODY REMOVAL  01/29/2018   Procedure: FOREIGN BODY REMOVAL;  Surgeon: Laurence Spates, MD;  Location: [place];  Service: Endoscopy;;  . IR CT HEAD LTD  11/16/2017  . IR PERCUTANEOUS ART THROMBECTOMY/INFUSION INTRACRANIAL INC DIAG ANGIO  11/16/2017  . LEFT HEART CATHETERIZATION WITH CORONARY ANGIOGRAM N/A 07/18/2014   Procedure: LEFT HEART CATHETERIZATION WITH CORONARY ANGIOGRAM;  Surgeon: Jettie Booze, MD; [facility] [place], LAD mild dz, D1 80%, D2 OK, CFX system OK, RCA OK, PDA 100%, med rx  . PERMANENT PACEMAKER INSERTION N/A 07/18/2014   Procedure: PERMANENT  PACEMAKER INSERTION;  Surgeon: Thompson Grayer, MD; Medtronic [place] model [id number] (serial number [id number])    . RADIOLOGY WITH ANESTHESIA N/A 11/16/2017   Procedure: RADIOLOGY WITH ANESTHESIA;  Surgeon: Luanne Bras, MD;  Location: [place];  Service: Radiology;  Laterality: N/A;  . TEMPORARY PACEMAKER INSERTION N/A 07/18/2014   Procedure: TEMPORARY PACEMAKER INSERTION;  Surgeon:  M Martinique, MD;  Location: [facility] CATH LAB;  Service: Cardiovascular;  Laterality: N/A;  . TOTAL HIP ARTHROPLASTY Right 11/26/2018    Procedure: HEMI HIP ARTHROPLASTY ANTERIOR APPROACH;  Surgeon: Rod Can, MD;  Location: [place];  Service: Orthopedics;  Laterality: Right;    Current Outpatient Medications  Medication Sig Dispense Refill  . acetaminophen (TYLENOL) 325 MG tablet Take 1-2 tablets (325-650 mg total) by mouth 2 (two) times daily as needed. (Patient taking differently: Take 325-650 mg by mouth 2 (two) times daily as needed for mild pain or headache. )    . aspirin (ASPIRIN CHILDRENS) 81 MG chewable tablet Chew 1 tablet (81 mg total) by mouth daily.    . citalopram (CELEXA) 10 MG tablet Take 1 tablet (10 mg total) by mouth daily. 90 tablet 1  . docusate sodium (COLACE) 100 MG capsule Take 1 capsule (100 mg total) by mouth 2 (two) times daily. 10 capsule 0  . feeding supplement, ENSURE ENLIVE, (ENSURE ENLIVE) LIQD Take 237 mLs by mouth 2 (two) times daily between meals. 237 mL 12  . lansoprazole (PREVACID) 15 MG capsule Take 1 capsule (15 mg total) by mouth daily. 90 capsule 1  . Magnesium Hydroxide (MILK OF MAGNESIA PO) Take 1 tablet by mouth daily.     . QUEtiapine (SEROQUEL) 25 MG tablet Take 0.5-1 tablets (12.5-25 mg total) by mouth at bedtime. 30 tablet 5  . tamsulosin (FLOMAX) 0.4 MG CAPS capsule Take 1 capsule by mouth once daily 30 capsule 5   No current facility-administered medications for this visit.    Allergies:   Warfarin and related, Atorvastatin, Ezetimibe, Atorvastatin calcium, Seroquel [quetiapine fumarate], Diazepam, and Penicillins    Social History:  The patient  reports that he quit smoking about 65 years ago. He has a 15.00 pack-year smoking history. His smokeless tobacco use includes chew. He reports that he does not drink alcohol or use drugs.   Family History:  The patient's family history includes Cancer in his brother, brother, and father; Heart disease in his sister and son; Hip fracture in his mother; Pneumonia in his mother; Prostate cancer in his brother.    ROS:  Please see  the history of present illness. All other systems are reviewed and negative.    PHYSICAL EXAM: VS:  BP 112/68   Pulse 89   Ht 5\' 6"  (1.676 m)   Wt 167 lb (75.8 kg)   SpO2 92%   BMI 26.95 kg/m  , BMI Body mass index is 26.95 kg/m. Affect appropriate Elderly white male with poor memory  HEENT: normal Neck supple with no adenopathy JVP normal no bruits no thyromegaly Lungs clear with no wheezing and good diaphragmatic motion Heart:  S1/S2 no murmur, no rub, gallop or click PMI normal pacer under left clavicle  Abdomen: benighn, BS positve, no tenderness, no AAA no bruit.  No HSM or HJR Distal pulses intact with no bruits Plus one bilateral  edema Neuro right hemiparesis with some contraction of right arm  Skin warm and dry No muscular weakness    EKG:   08/13/14   atrial fib, V pacing with 2 intrinsic beats. Of  note, his heart rate increased to 115 with ambulation; oxygen saturation remained greater than 95%   Recent Labs: 05/28/2018: TSH 2.57 12/28/2018: ALT 30; BUN 24; Creatinine, Ser 1.11; Hemoglobin 13.2; Magnesium 2.1; Platelets 272.0; Potassium 4.6; Sodium 138   Lipid Panel    Component Value Date/Time   CHOL 151 11/17/2017 0500   TRIG 61 11/17/2017 0500   HDL 37 (L) 11/17/2017 0500   CHOLHDL 4.1 11/17/2017 0500   VLDL 12 11/17/2017 0500   LDLCALC 102 (H) 11/17/2017 0500   LDLDIRECT 155.7 10/19/2012 0851     Wt Readings from Last 3 Encounters:  04/11/19 167 lb (75.8 kg)  12/28/18 155 lb 9 oz (70.6 kg)  11/25/18 186 lb (84.4 kg)     Other studies Reviewed: Additional studies/ records that were reviewed today include: recent hospital records.  ASSESSMENT AND PLAN:  1.  Lower extremity Edema:  PRN HCTZ 12.5 mg  refilled  2. 07/26/14  non-STEMI, medical therapy for occluded PDA: The patient is not having any ischemic symptoms. EF was 55% by echocardiogram. Continue beta blocker and aspirin. Plavix stopped due to side effects   3. Hypertension: Mr. Loughran  has been on carvedilol 25 mg twice a day for a long time. His blood pressure is well controlled.  4. Constipation:  Discussed hydration fiber and colace 100 bid called in   5. Hypokalemia: resovled   6. DM:  Discussed low carb diet.  Target hemoglobin A1c is 6.5 or less.  Continue current medications.  Lab Results  Component Value Date   HGBA1C 6.0 05/28/2018     7. Pacer :  Normal function single lead MDT implant 2016 CHB Continue remote transmissions f/u [facility] in spring  Set VVIR due to afib   8. Chol:  Would not start statin given age and lack of active vascular disease     9. GI:  Schatzki ring with food impaction EGD 01/06/16 with esophagitis Continue protonix has f/u with [facility]    10:  Afib:  Rate control is fine patient declines anticoagulation Risks discussed and he has not changed his mind [place] VASC 4   11. Stroke:  With resultant dementia has round the clock sitters at home still does not want anticoagulation and would be complicated by history of GI bleeding    Current medicines are reviewed at length with the patient today.  The patient has concerns regarding medicines.  The following changes have been made: None  Labs/ tests ordered today include:  None   No orders of the defined types were placed in this encounter.    Disposition:   FU with me in a year   Signed, Jenkins Rouge, MD  04/11/2019 2:46 PM    [address], [place], [place]  [address] Phone: [phone]; Fax: [phone]

## 2019-04-11 ENCOUNTER — Other Ambulatory Visit: Payer: Self-pay

## 2019-04-11 ENCOUNTER — Ambulatory Visit (INDEPENDENT_AMBULATORY_CARE_PROVIDER_SITE_OTHER): Payer: Medicare Other | Admitting: Cardiovascular Disease

## 2019-04-11 ENCOUNTER — Encounter: Payer: Self-pay | Admitting: Cardiovascular Disease

## 2019-04-11 VITALS — BP 112/68 | HR 89 | Ht 66.0 in | Wt 167.0 lb

## 2019-04-11 DIAGNOSIS — I63311 Cerebral infarction due to thrombosis of right middle cerebral artery: Secondary | ICD-10-CM

## 2019-04-11 DIAGNOSIS — I482 Chronic atrial fibrillation, unspecified: Secondary | ICD-10-CM

## 2019-04-11 NOTE — Patient Instructions (Signed)
Medication Instructions:   *If you need a refill on your cardiac medications before your next appointment, please call your pharmacy*  Lab Work:  If you have labs (blood work) drawn today and your tests are completely normal, you will receive your results only by: [name] MyChart Message (if you have MyChart) OR . A paper copy in the mail If you have any lab test that is abnormal or we need to change your treatment, we will call you to review the results.  Follow-Up: At [facility], you and your health needs are our priority.  As part of our continuing mission to provide you with exceptional heart care, we have created designated Provider Care Teams.  These Care Teams include your primary Cardiologist (physician) and Advanced Practice Providers (APPs -  Physician Assistants and Nurse Practitioners) who all work together to provide you with the care you need, when you need it.  Your next appointment:   1 year(s)  The format for your next appointment:   In Person  Provider:   You may see Dr. Johnsie Cancel or one of the following Advanced Practice Providers on your designated Care Team:    Truitt Merle, NP  Cecilie Kicks, NP  Kathyrn Drown, NP  Your physician recommends that you schedule a follow-up appointment in: 6 months with Dr. Rayann Heman.

## 2019-05-14 ENCOUNTER — Ambulatory Visit (INDEPENDENT_AMBULATORY_CARE_PROVIDER_SITE_OTHER): Payer: Medicare Other | Admitting: *Deleted

## 2019-05-14 DIAGNOSIS — I63311 Cerebral infarction due to thrombosis of right middle cerebral artery: Secondary | ICD-10-CM | POA: Diagnosis not present

## 2019-05-15 LAB — CUP PACEART REMOTE DEVICE CHECK
Battery Impedance: 1401 Ohm
Battery Remaining Longevity: 54 mo
Battery Voltage: 2.75 V
Brady Statistic RV Percent Paced: 17 %
Date Time Interrogation Session: 20210113105550
Implantable Lead Implant Date: 20160318
Implantable Lead Location: 753860
Implantable Lead Model: 5076
Implantable Pulse Generator Implant Date: 20160318
Lead Channel Impedance Value: 0 Ohm
Lead Channel Impedance Value: 559 Ohm
Lead Channel Pacing Threshold Amplitude: 0.75 V
Lead Channel Pacing Threshold Pulse Width: 0.4 ms
Lead Channel Setting Pacing Amplitude: 2.5 V
Lead Channel Setting Pacing Pulse Width: 0.4 ms
Lead Channel Setting Sensing Sensitivity: 2 mV

## 2019-06-04 ENCOUNTER — Other Ambulatory Visit: Payer: Self-pay

## 2019-06-04 ENCOUNTER — Encounter: Payer: Self-pay | Admitting: Family Medicine

## 2019-06-04 ENCOUNTER — Ambulatory Visit (INDEPENDENT_AMBULATORY_CARE_PROVIDER_SITE_OTHER): Payer: Medicare Other | Admitting: Family Medicine

## 2019-06-04 DIAGNOSIS — L602 Onychogryphosis: Secondary | ICD-10-CM | POA: Diagnosis not present

## 2019-06-04 DIAGNOSIS — I63311 Cerebral infarction due to thrombosis of right middle cerebral artery: Secondary | ICD-10-CM

## 2019-06-04 DIAGNOSIS — M25559 Pain in unspecified hip: Secondary | ICD-10-CM | POA: Diagnosis not present

## 2019-06-04 DIAGNOSIS — G479 Sleep disorder, unspecified: Secondary | ICD-10-CM

## 2019-06-04 MED ORDER — CITALOPRAM HYDROBROMIDE 10 MG PO TABS: 10.0000 mg | ORAL_TABLET | Freq: Every day | ORAL | 1 refills | Status: DC

## 2019-06-04 MED ORDER — DICLOFENAC SODIUM 1 % EX GEL: 2.0000 g | Freq: Four times a day (QID) | CUTANEOUS | 3 refills | Status: AC | PRN

## 2019-06-04 NOTE — Progress Notes (Signed)
This visit occurred during the SARS-CoV-2 public health emergency.  Safety protocols were in place, including screening questions prior to the visit, additional usage of staff PPE, and extensive cleaning of exam room while observing appropriate contact time as indicated for disinfecting solutions.  Taking 12.5 vs 25mg  Seroquel, alternating at night.   He is taking citalopram in the PM.  He has had more troubles in the AMs, more irritable, more argumentative with his daughter.  He has been confused about details recently, at baseline.   He has refused taking colace but is taking senna prn for constipation.  No black stools.    R hip pain.  Pain with walking.  Usually walking in the house, to and from the [place] with walker.  Prev with some walking outside when weather has been good.  No falls but did tip toward the wall in the bathroom once, without going to ground.  No known trauma.    R thumb injured in distant past.  Nail is thickened and he has some discomfort locally.    Meds, vitals, and allergies reviewed.   ROS: Per HPI unless specifically indicated in ROS section   nad ncat In wheelchair.  Able to answer some questions in conversation. rrr ctab abd soft, not ttp.  Back not tender to palpation. Normal right hip range of motion but tender to palpation at the greater trochanteric bursa. Right first finger with thickened nail but it does not appear to be infected.  It is not ingrown in the midportion of the nail but he does have some encroachment on the leading edge medially and laterally.

## 2019-06-04 NOTE — Patient Instructions (Signed)
Likely bursitis.  Try using diclofenac gel as needed.  Ice as needed for 5 minutes at a time.  Soak your nail and try to use a file to gently lift the leading edge.  Okay to try citalopram in the AM.  Update me as needed.  Take care.  Glad to see you.

## 2019-06-06 ENCOUNTER — Other Ambulatory Visit: Payer: Self-pay | Admitting: Family Medicine

## 2019-06-06 NOTE — Telephone Encounter (Signed)
Electronic refill request. Quetiapine Last office visit:   06/04/2019 Last Filled:    30 tablet 5 03/07/2019  Please advise.

## 2019-06-07 NOTE — Telephone Encounter (Signed)
Sent. Thanks.   

## 2019-06-09 DIAGNOSIS — M25559 Pain in unspecified hip: Secondary | ICD-10-CM | POA: Insufficient documentation

## 2019-06-09 DIAGNOSIS — L602 Onychogryphosis: Secondary | ICD-10-CM | POA: Insufficient documentation

## 2019-06-09 NOTE — Assessment & Plan Note (Addendum)
Likely trochanteric bursitis and reasonable to try icing and update me as needed.  Can try diclofenac gel.

## 2019-06-09 NOTE — Assessment & Plan Note (Signed)
Discussed options.  Reasonable to try citalopram in the AM with seroquel at night.  Alternating 12.5 versus 25 mg of Seroquel at night seems to help the patient with sleep.

## 2019-06-09 NOTE — Assessment & Plan Note (Signed)
If we remove the nail he is likely going to have a prolonged recovery.  It may be more reasonable to try to soak the nail to soften in the meantime and then gently try to lift the medial lateral portion of the nail since the middle portion is not ingrown.  It does not appear infected.  They can update me as needed.

## 2019-07-07 ENCOUNTER — Encounter: Payer: Self-pay | Admitting: Family Medicine

## 2019-07-10 ENCOUNTER — Other Ambulatory Visit: Payer: Self-pay | Admitting: Family Medicine

## 2019-07-10 MED ORDER — CITALOPRAM HYDROBROMIDE 10 MG PO TABS: 20.0000 mg | ORAL_TABLET | Freq: Every day | ORAL | Status: DC

## 2019-07-18 IMAGING — DX DG CHEST 1V PORT
1 series · 1 of 1 positions shown · non-contrast
Comparison: 11/16/2017

CLINICAL DATA: Dysphagia, fever, leukocytosis

EXAM:
PORTABLE CHEST 1 VIEW

[chest ap]
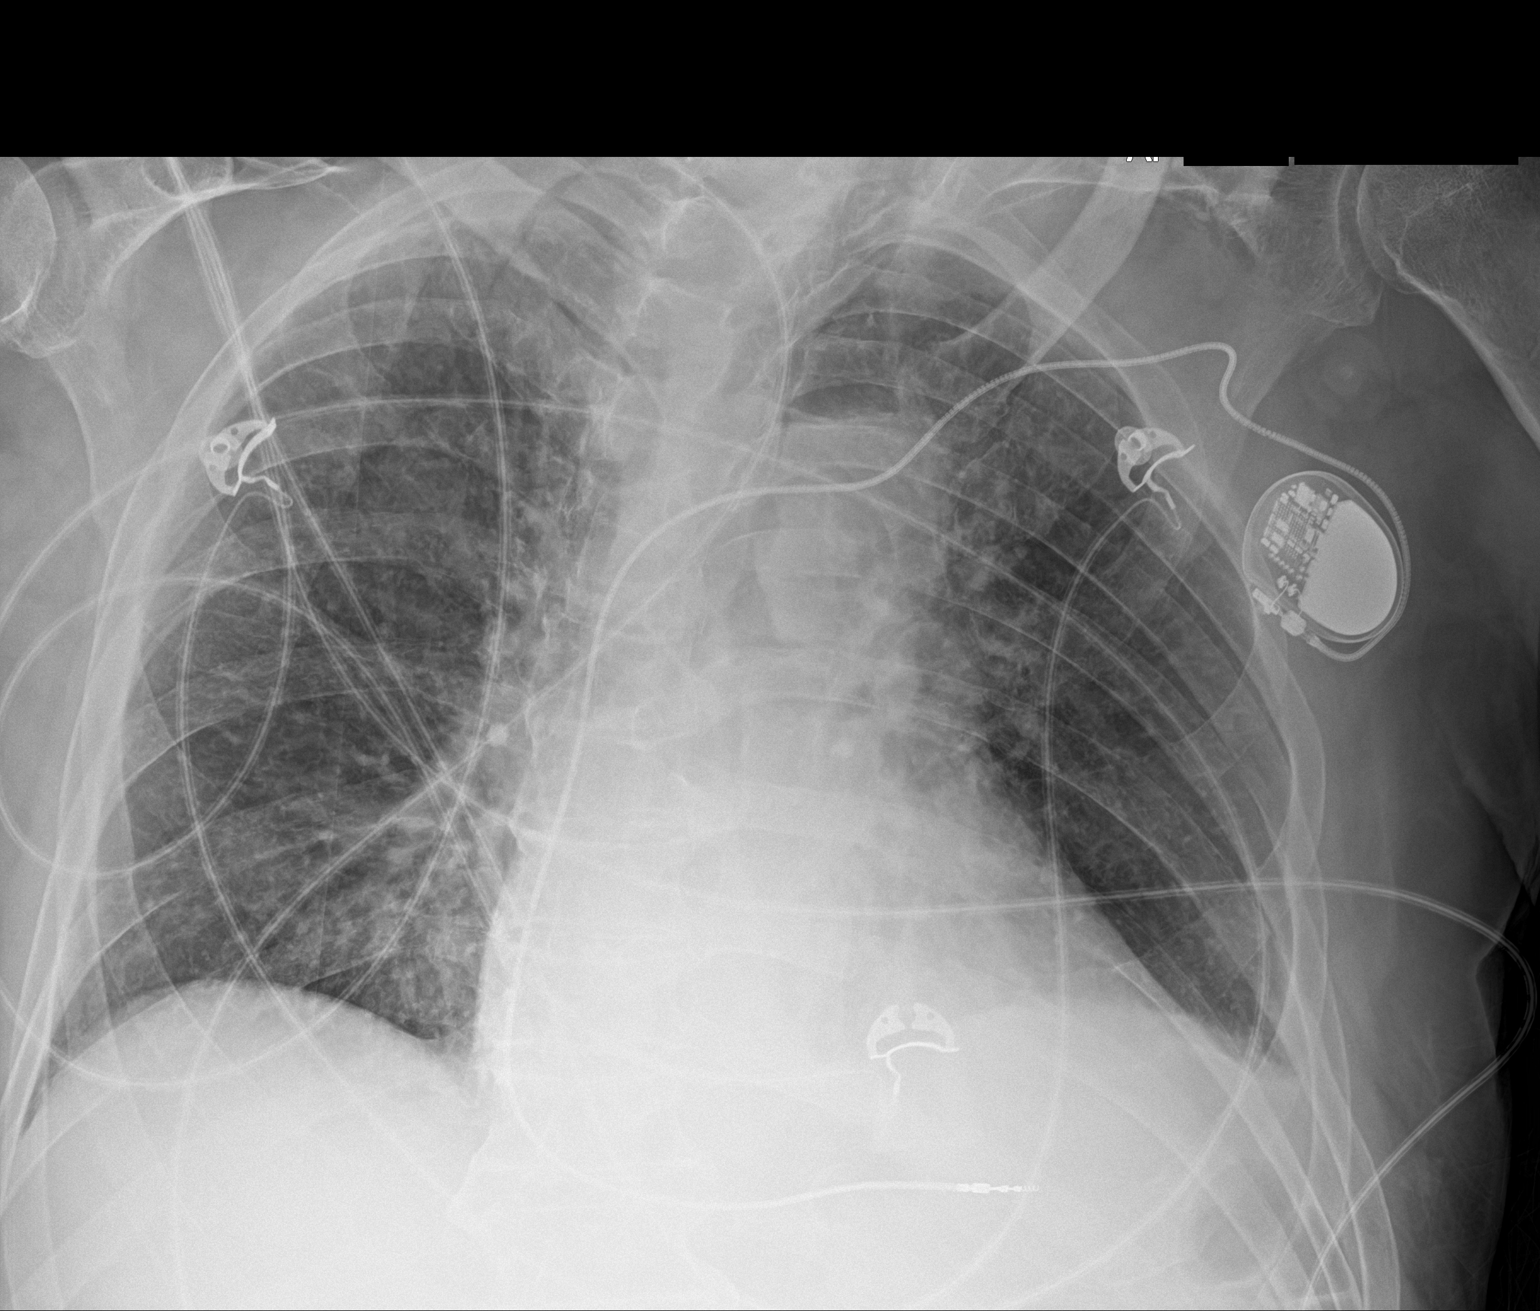

[1 of 1 positions shown; findings below may reference images not displayed]

FINDINGS: Similar low lung volumes. Cardiomegaly with mild vascular
congestion. No focal pneumonia, collapse or consolidation. No large
effusion or pneumothorax. Exam is rotated to the left. Aorta is
atherosclerotic. Single lead left subclavian pacer noted.
IMPRESSION: Low lung volumes with cardiomegaly and mild increased vascular
congestion.

## 2019-07-21 IMAGING — DX DG CHEST 2V
3 series · 3 of 3 positions shown · non-contrast
Comparison: Radiograph November 18, 2017.

CLINICAL DATA: Leukocytosis.

EXAM:
CHEST - 2 VIEW

[x chest ap]
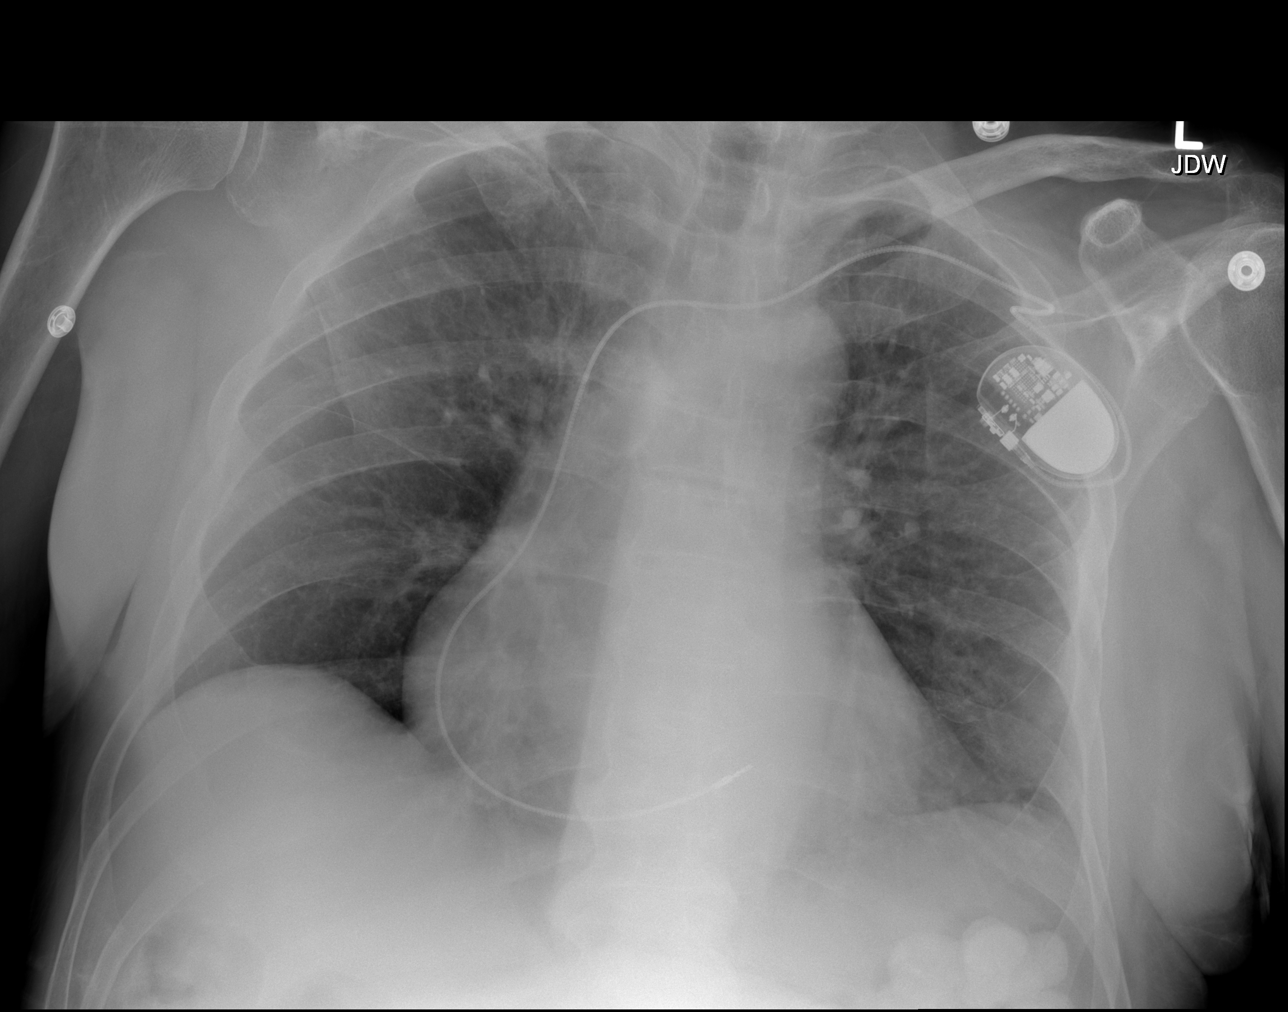

[w chest lat (1 of 2)]
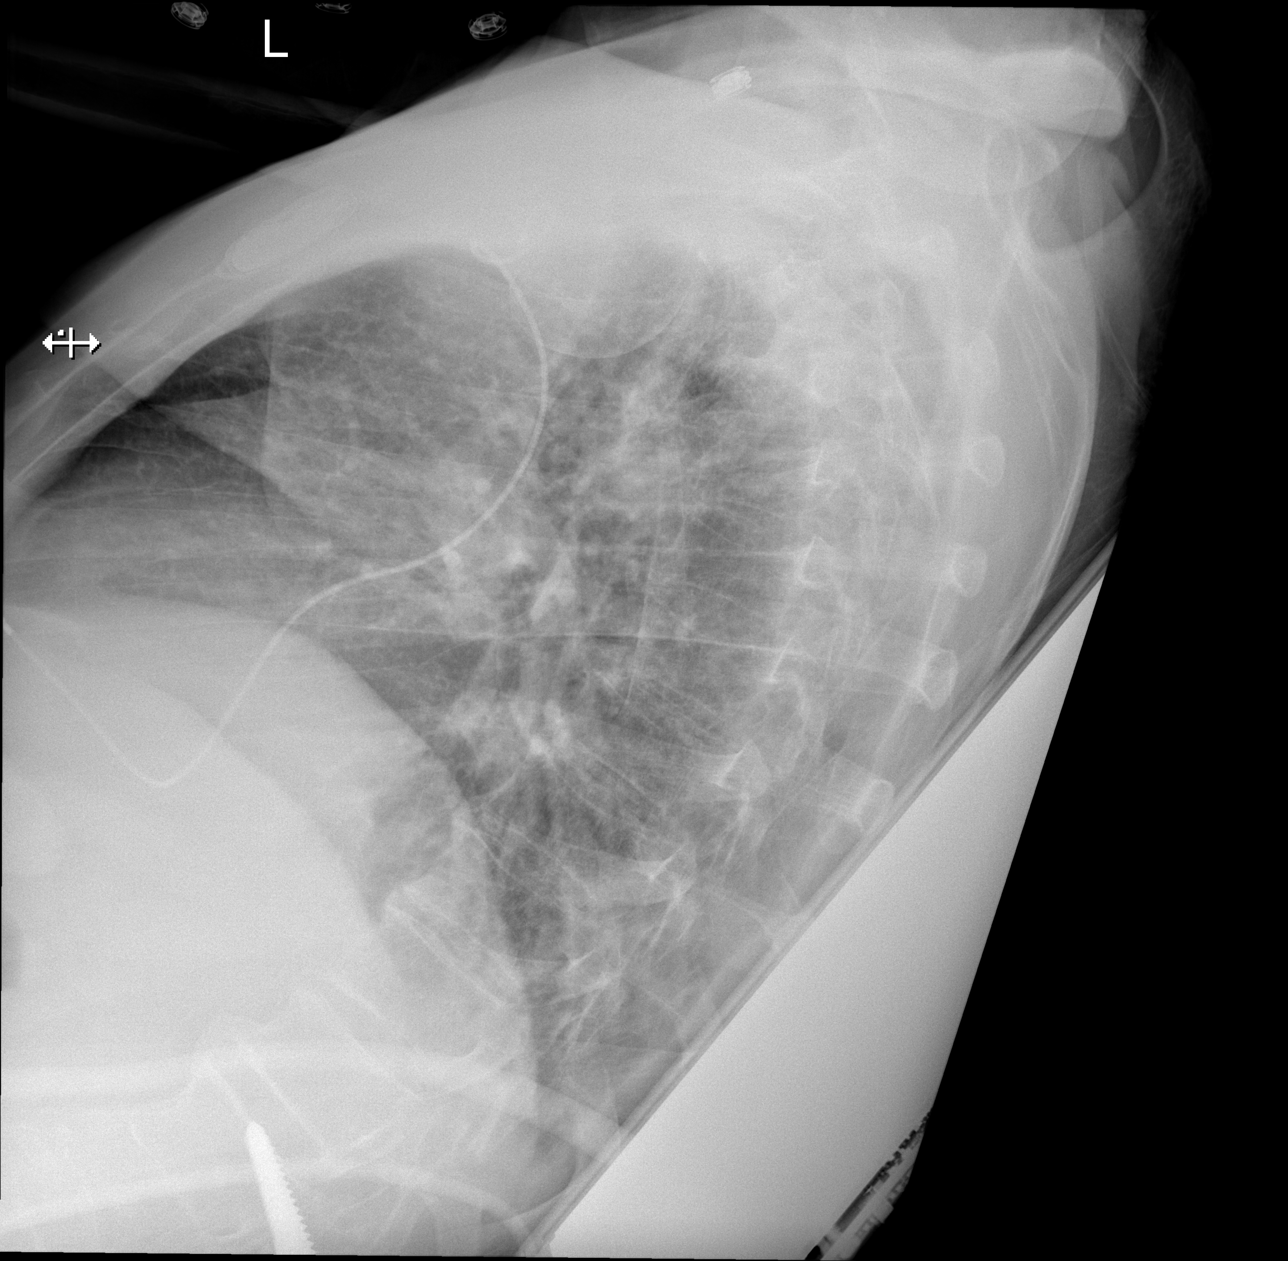

[w chest lat (2 of 2)]
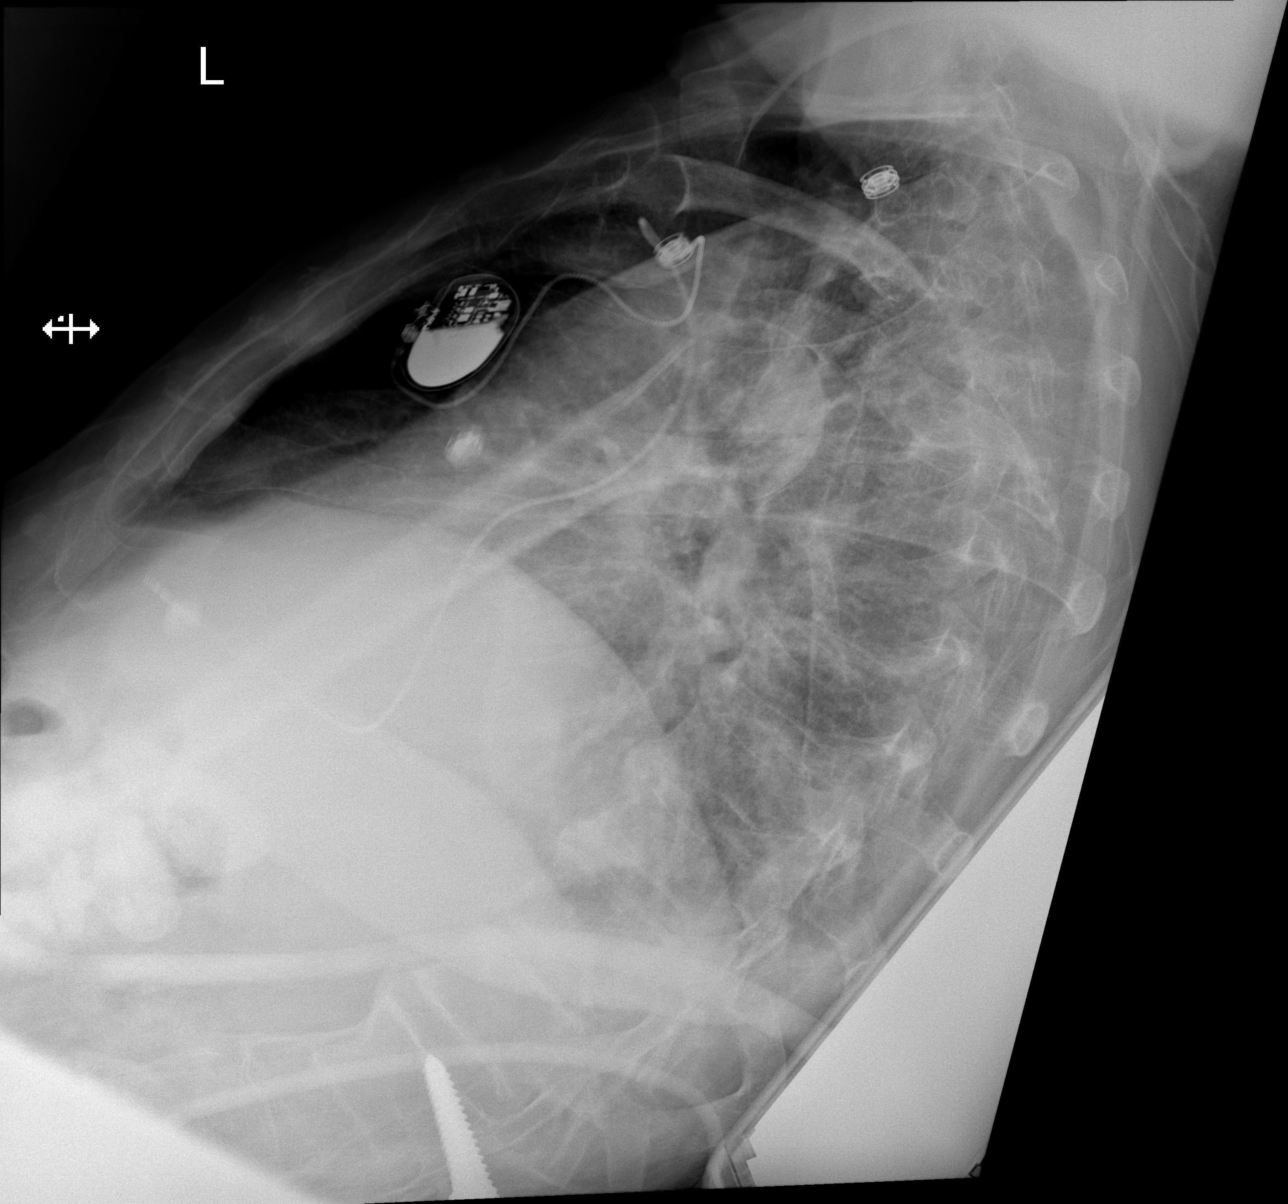

[3 of 3 positions shown; findings below may reference images not displayed]

FINDINGS: Stable cardiomegaly. Single lead left-sided pacemaker is unchanged
in position. No pneumothorax or pleural effusion is noted. Both
lungs are clear. The visualized skeletal structures are
unremarkable.
IMPRESSION: No active cardiopulmonary disease.

## 2019-07-24 ENCOUNTER — Other Ambulatory Visit: Payer: Self-pay | Admitting: Internal Medicine

## 2019-07-24 ENCOUNTER — Ambulatory Visit (INDEPENDENT_AMBULATORY_CARE_PROVIDER_SITE_OTHER)
Admission: RE | Admit: 2019-07-24 | Discharge: 2019-07-24 | Disposition: A | Discharge status: Home / Self Care | Payer: Medicare Other | Source: Ambulatory Visit | Attending: Internal Medicine | Admitting: Internal Medicine

## 2019-07-24 ENCOUNTER — Other Ambulatory Visit: Payer: Self-pay

## 2019-07-24 ENCOUNTER — Encounter: Payer: Self-pay | Admitting: Internal Medicine

## 2019-07-24 ENCOUNTER — Ambulatory Visit (INDEPENDENT_AMBULATORY_CARE_PROVIDER_SITE_OTHER): Payer: Medicare Other | Admitting: Internal Medicine

## 2019-07-24 VITALS — BP 108/68 | HR 93 | Temp 97.5°F

## 2019-07-24 DIAGNOSIS — S2242XA Multiple fractures of ribs, left side, initial encounter for closed fracture: Secondary | ICD-10-CM | POA: Diagnosis not present

## 2019-07-24 DIAGNOSIS — M545 Low back pain, unspecified: Secondary | ICD-10-CM

## 2019-07-24 DIAGNOSIS — R0781 Pleurodynia: Secondary | ICD-10-CM

## 2019-07-24 DIAGNOSIS — S0081XA Abrasion of other part of head, initial encounter: Secondary | ICD-10-CM | POA: Diagnosis not present

## 2019-07-24 DIAGNOSIS — R296 Repeated falls: Secondary | ICD-10-CM

## 2019-07-24 DIAGNOSIS — S3992XA Unspecified injury of lower back, initial encounter: Secondary | ICD-10-CM | POA: Diagnosis not present

## 2019-07-24 DIAGNOSIS — I63311 Cerebral infarction due to thrombosis of right middle cerebral artery: Secondary | ICD-10-CM | POA: Diagnosis not present

## 2019-07-24 MED ORDER — LIDOCAINE 5 % EX PTCH: 1.0000 | MEDICATED_PATCH | CUTANEOUS | 0 refills | Status: AC

## 2019-07-24 NOTE — Progress Notes (Signed)
Subjective:    Patient ID: Dakota [name], male    DOB: 12/23/1928, 86 y.o.   MRN: [id number]  HPI  Patient presents to the clinic today with complaint of left side pain and low back pain status post a fall that occurred over the weekend. She reports he had 2 falls. He fell backwards in the bathroom and fell on his left side in the living room. He has a history of dementia with frequent falls. He also has left hemiplegia post stroke according to his daughter. He does continue to ambulate with walker and assistance. He has been taking Tylenol as needed for pain.   Review of Systems      Past Medical History:  Diagnosis Date  . Anxiety   . Back pain, chronic   . Complete heart block ([place]) 07/18/2014   Med[employer] [place] model [id number] (serial number [id number])  singe lead PPM  . Esophageal obstruction due to food impaction 2017  . GERD (gastroesophageal reflux disease) 10/2002  . Hyperlipidemia 12/1994  . Hypertension   . Hypokalemia   . Lung collapse 03/1991   Fall  . Permanent atrial fibrillation ([name])    Refused coumadin therapy  . Stroke [facility])     Current Outpatient Medications  Medication Sig Dispense Refill  . acetaminophen (TYLENOL) 325 MG tablet Take 1-2 tablets (325-650 mg total) by mouth 2 (two) times daily as needed.    [name] aspirin (ASPIRIN CHILDRENS) 81 MG chewable tablet Chew 1 tablet (81 mg total) by mouth daily.    . citalopram (CELEXA) 10 MG tablet Take 2 tablets (20 mg total) by mouth daily.    . diclofenac Sodium (VOLTAREN) 1 % GEL Apply 2 g topically 4 (four) times daily as needed (for hip pain). 100 g 3  . feeding supplement, ENSURE ENLIVE, (ENSURE ENLIVE) LIQD Take 237 mLs by mouth 2 (two) times daily between meals. 237 mL 12  . lansoprazole (PREVACID) 15 MG capsule Take 1 capsule (15 mg total) by mouth daily. 90 capsule 1  . QUEtiapine (SEROQUEL) 25 MG tablet Take 0.5-1 tablets (12.5-25 mg total) by mouth at bedtime. 30 tablet 5  . senna (SENOKOT) 8.6 MG TABS  tablet Take 2 tablets by mouth daily.    . tamsulosin (FLOMAX) 0.4 MG CAPS capsule Take 1 capsule by mouth once daily 30 capsule 5   No current facility-administered medications for this visit.    Allergies  Allergen Reactions  . Warfarin And Related Other (See Comments)    Lost a lot of weight and also experienced numbness  . Atorvastatin Other (See Comments)    REACTION: MUSCLE PAIN  . Ezetimibe Other (See Comments)    REACTION: MUSCLE PAIN  . Atorvastatin Calcium   . Seroquel [Quetiapine Fumarate]     Sedation at higher dose but can tolerate 12.5-25mg    . Diazepam Other (See Comments)    UNKNOWN, per pt  . Penicillins Other (See Comments)    UNKNOWN, per pt    Family History  Problem Relation Age of Onset  . Pneumonia Mother   . Hip fracture Mother   . Cancer Father        splenic  . Heart disease Sister        Pacer placed  . Cancer Brother        throat and lung  . Cancer Brother        prostate, age 65  . Prostate cancer Brother   . Heart disease Son   . Stroke  Neg Hx   . Colon cancer Neg Hx     Social History   Socioeconomic History  . Marital status: Widowed    Spouse name: Not on file  . Number of children: 4  . Years of education: Not on file  . Highest education level: Not on file  Occupational History  . Occupation: retired    Comment: Architect  Tobacco Use  . Smoking status: Former Smoker    Packs/day: 1.00    Years: 15.00    Pack years: 15.00    Quit date: 05/02/1953    Years since quitting: 1.2  . Smokeless tobacco: Current User    Types: Chew  . Tobacco comment: Quit smoking over 50 years ago  Substance and Sexual Activity  . Alcohol use: No  . Drug use: No  . Sexual activity: Never  Other Topics Concern  . Not on file  Social History Narrative   Retired from [employer], building/construction   Married 1953   4 kids   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 (she died 10/07/18)   Social Determinants of Health   Financial  Resource Strain:   . Difficulty of Paying Living Expenses:   Food Insecurity:   . Worried About Charity fundraiser in the Last Year:   . Arboriculturist in the Last Year:   Transportation Needs:   . Film/video editor (Medical):   [name] Lack of Transportation (Non-Medical):   Physical Activity:   . Days of Exercise per Week:   . Minutes of Exercise per Session:   Stress:   . Feeling of Stress :   Social Connections:   . Frequency of Communication with Friends and Family:   . Frequency of Social Gatherings with Friends and Family:   . Attends Religious Services:   . Active Member of Clubs or Organizations:   . Attends Archivist Meetings:   [name] Marital Status:   Intimate Partner Violence:   . Fear of Current or Ex-Partner:   . Emotionally Abused:   [name] Physically Abused:   . Sexually Abused:      Constitutional: Pt reports multiple falls.  HEENT: Denies eye pain, eye redness or vision changes. Respiratory: Denies difficulty breathing, shortness of breath, cough or sputum production.   Cardiovascular: Denies chest pain, chest tightness.  Gastrointestinal: Caregiver reports constipation. Denies abdominal pain, bloating, diarrhea or blood in the stool.  GU: Denies urgency, frequency, pain with urination, burning sensation, blood in urine, odor or discharge. Musculoskeletal: Pt reports left side rib pain, low back pain. Pt has decreased ROM, difficulty with gait.  Skin: Pt reports bruising around left eye. Denies redness, rashes, lesions or ulcercations.  Neurological: Caregiver reports difficulty with memory, problems with balance and coordination. Denies dizziness.    No other specific complaints in a complete review of systems (except as listed in HPI above).  Objective:   Physical Exam  BP 108/68   Pulse 93   Temp (!) 97.5 F (36.4 C) (Temporal)   SpO2 97%   Wt Readings from Last 3 Encounters:  06/04/19 167 lb 8 oz (76 kg)  04/11/19 167 lb (75.8 kg)    12/28/18 155 lb 9 oz (70.6 kg)    General: Appears his stated age, chronically ill appearing,  in NAD. Skin: Periorbital contusion noted on the left. HEENT: Head: normal shape and size; Eyes: sclera white, no icterus, conjunctiva pink, PERRL; Cardiovascular: Normal rate and rhythm.  Pulmonary/Chest: Normal effort and  positive vesicular breath sounds. No respiratory distress.  Abdomen: Soft and generally tender (no BM in 3 days). Hypoactive bowel sounds.  Musculoskeletal: Decreased ROM of the cervical spine. Unable to assess ROM of the thoracolumbar spine. Bony tenderness over the lumbar spine. He is able to bear weight. Strength 3/5 LUE/LLE, Strength 4/5 RUE/LUE. No pain with palpation around the left orbit. Pain with palpation of the left side of the ribs. Presents in wheelchair.  Neurological: Awake, engages with simple answers   BMET    Component Value Date/Time   NA 138 12/28/2018 1223   K 4.6 12/28/2018 1223   CL 101 12/28/2018 1223   CO2 31 12/28/2018 1223   GLUCOSE 106 (H) 12/28/2018 1223   BUN 24 (H) 12/28/2018 1223   CREATININE 1.11 12/28/2018 1223   CALCIUM 9.7 12/28/2018 1223   GFRNONAA >60 11/29/2018 0223   GFRAA >60 11/29/2018 0223    Lipid Panel     Component Value Date/Time   CHOL 151 11/17/2017 0500   TRIG 61 11/17/2017 0500   HDL 37 (L) 11/17/2017 0500   CHOLHDL 4.1 11/17/2017 0500   VLDL 12 11/17/2017 0500   LDLCALC 102 (H) 11/17/2017 0500    CBC    Component Value Date/Time   WBC 9.0 12/28/2018 1223   RBC 4.54 12/28/2018 1223   HGB 13.2 12/28/2018 1223   HCT 39.8 12/28/2018 1223   PLT 272.0 12/28/2018 1223   MCV 87.7 12/28/2018 1223   MCH 28.7 11/29/2018 0223   MCHC 33.1 12/28/2018 1223   RDW 14.7 12/28/2018 1223   LYMPHSABS 2.1 12/28/2018 1223   MONOABS 0.7 12/28/2018 1223   EOSABS 0.4 12/28/2018 1223   BASOSABS 0.1 12/28/2018 1223    Hgb A1C Lab Results  Component Value Date   HGBA1C 6.0 05/28/2018           Assessment & Plan:    Left Side Rib Pain, Low Back Pain, Abrasion of Face s/p Fall:  Xray left side ribs Xray lumbar spine RX for Lidoderm patches 5% daily Continue Tylenol  Will follow up after xrays, return precautions discussed Webb Silversmith, NP This visit occurred during the SARS-CoV-2 public health emergency.  Safety protocols were in place, including screening questions prior to the visit, additional usage of staff PPE, and extensive cleaning of exam room while observing appropriate contact time as indicated for disinfecting solutions.

## 2019-07-24 NOTE — Patient Instructions (Signed)

## 2019-07-25 MED ORDER — QUETIAPINE FUMARATE 25 MG PO TABS: 12.5000 mg | ORAL_TABLET | Freq: Every day | ORAL | 1 refills | Status: DC

## 2019-07-25 MED ORDER — CITALOPRAM HYDROBROMIDE 20 MG PO TABS: 20.0000 mg | ORAL_TABLET | Freq: Every day | ORAL | 1 refills | Status: DC

## 2019-07-29 IMAGING — DX DG CHEST 1V PORT
1 series · 1 of 1 positions shown · non-contrast
Comparison: 11/28/2017

CLINICAL DATA: Respiratory failure

EXAM:
PORTABLE CHEST 1 VIEW

[chest ap]
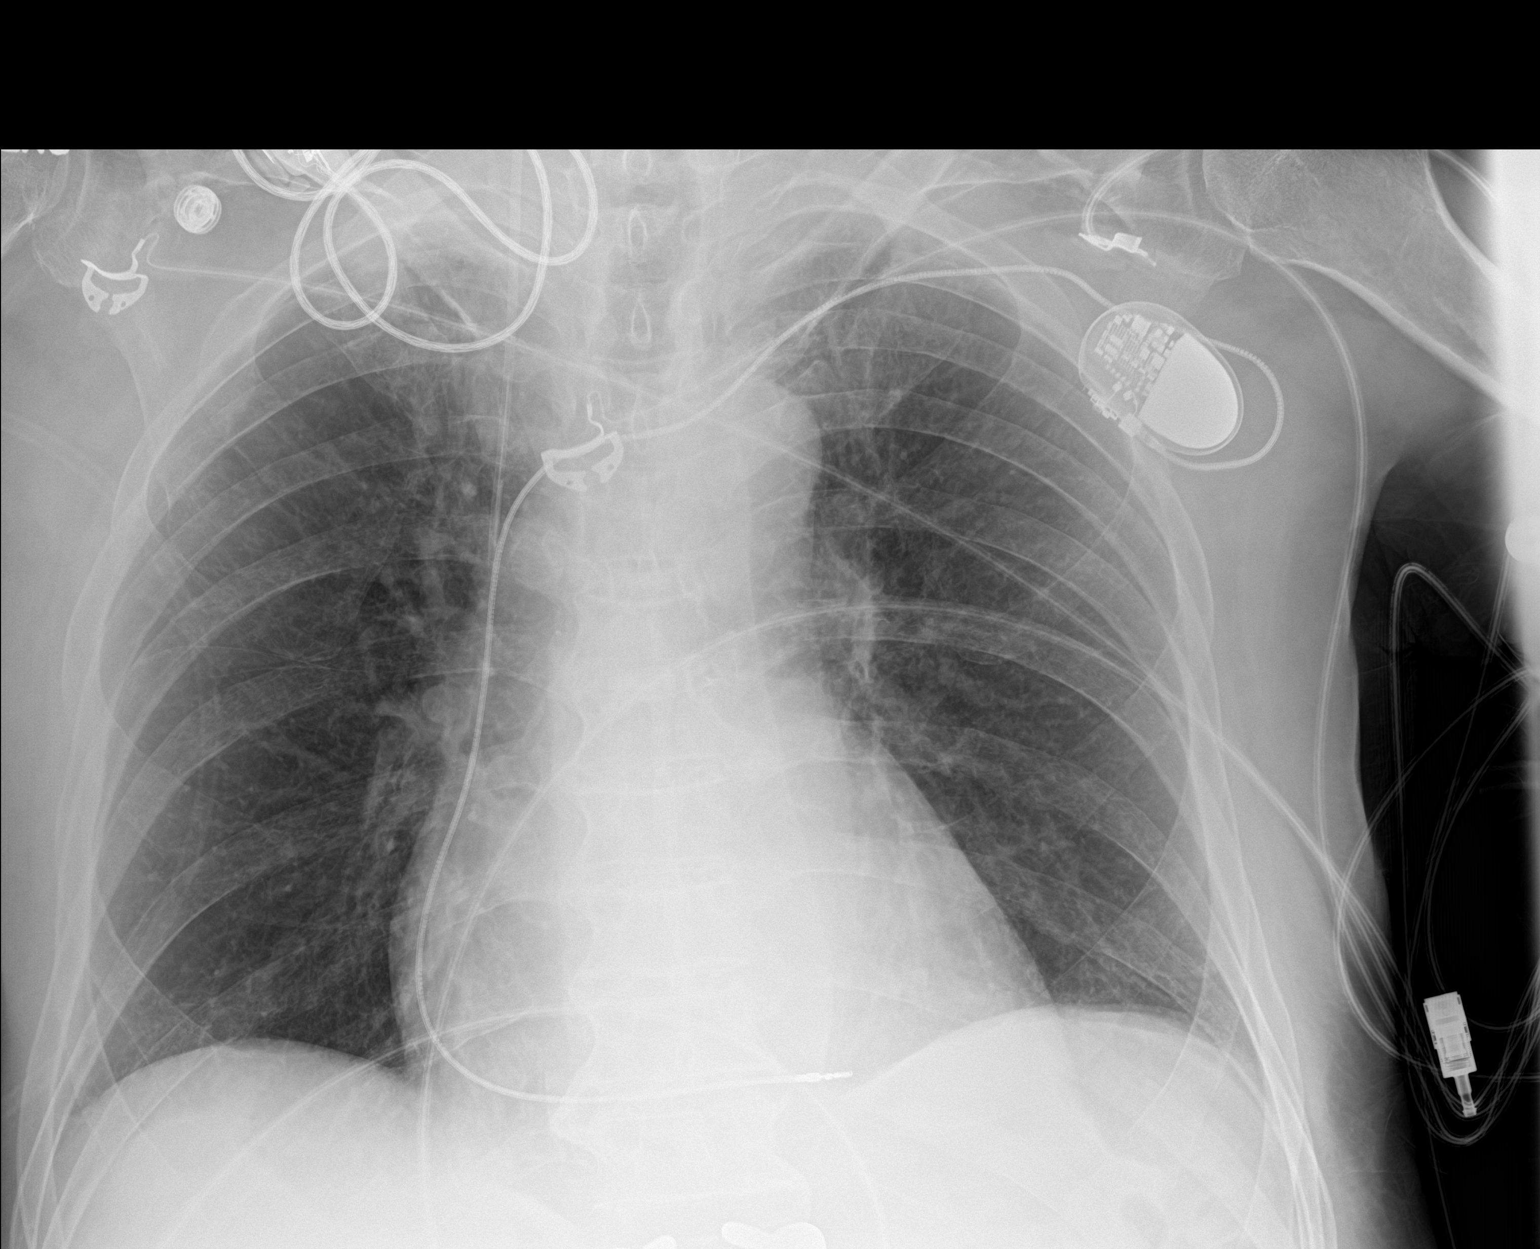

[1 of 1 positions shown; findings below may reference images not displayed]

FINDINGS: Left pacer and right central line remain in place, unchanged. Heart
is upper limits normal in size. Lungs clear. No effusions or acute
bony abnormality.
IMPRESSION: No active disease.

## 2019-08-12 ENCOUNTER — Other Ambulatory Visit: Payer: Self-pay | Admitting: Family Medicine

## 2019-08-12 DIAGNOSIS — F0151 Vascular dementia with behavioral disturbance: Secondary | ICD-10-CM

## 2019-08-12 DIAGNOSIS — F01518 Vascular dementia, unspecified severity, with other behavioral disturbance: Secondary | ICD-10-CM

## 2019-08-12 NOTE — Telephone Encounter (Signed)
Anthis pts daughter (DPR signed) left v/m that  Dr Damita Dunnings increased pts seroquel and pt is still not sleeping during the night. The combination of meds pt is presently taking is not working; pt is very verbal during the day. Roberta request pt be referred to altheimers or dementia specialist or change pts meds. Roberta request cb after Dr Damita Dunnings reviews this note. [facility] [place].

## 2019-08-13 ENCOUNTER — Ambulatory Visit (INDEPENDENT_AMBULATORY_CARE_PROVIDER_SITE_OTHER): Payer: Medicare Other | Admitting: *Deleted

## 2019-08-13 DIAGNOSIS — I63311 Cerebral infarction due to thrombosis of right middle cerebral artery: Secondary | ICD-10-CM

## 2019-08-14 ENCOUNTER — Other Ambulatory Visit: Payer: Self-pay | Admitting: Family Medicine

## 2019-08-14 LAB — CUP PACEART REMOTE DEVICE CHECK
Battery Impedance: 1624 Ohm
Battery Remaining Longevity: 49 mo
Battery Voltage: 2.74 V
Brady Statistic RV Percent Paced: 17 %
Date Time Interrogation Session: 20210414111513
Implantable Lead Implant Date: 20160318
Implantable Lead Location: 753860
Implantable Lead Model: 5076
Implantable Pulse Generator Implant Date: 20160318
Lead Channel Impedance Value: 0 Ohm
Lead Channel Impedance Value: 502 Ohm
Lead Channel Pacing Threshold Amplitude: 0.75 V
Lead Channel Pacing Threshold Pulse Width: 0.4 ms
Lead Channel Setting Pacing Amplitude: 2.5 V
Lead Channel Setting Pacing Pulse Width: 0.4 ms
Lead Channel Setting Sensing Sensitivity: 2 mV

## 2019-08-14 MED ORDER — QUETIAPINE FUMARATE 25 MG PO TABS: 37.5000 mg | ORAL_TABLET | Freq: Every day | ORAL | 1 refills | Status: DC

## 2019-08-14 NOTE — Progress Notes (Signed)
PPM Remote  

## 2019-08-30 ENCOUNTER — Encounter: Payer: Self-pay | Admitting: Family Medicine

## 2019-09-09 ENCOUNTER — Encounter: Payer: Self-pay | Admitting: Neurology

## 2019-09-18 ENCOUNTER — Encounter: Payer: Self-pay | Admitting: Family Medicine

## 2019-09-19 MED ORDER — CITALOPRAM HYDROBROMIDE 20 MG PO TABS: 20.0000 mg | ORAL_TABLET | Freq: Every day | ORAL | 1 refills | Status: AC

## 2019-09-19 NOTE — Telephone Encounter (Signed)
Is there any way his August appointment can get moved up?  I would appreciate any advice in the meantime.  Thanks.

## 2019-09-29 ENCOUNTER — Other Ambulatory Visit: Payer: Self-pay | Admitting: Family Medicine

## 2019-10-01 NOTE — Telephone Encounter (Signed)
Last office visit 07/24/2019 for multiple falls with R. Baity.  Last refilled 08/14/2019 for #45 with 1 refill.  No future appointments with PCP.

## 2019-10-02 NOTE — Telephone Encounter (Signed)
Sent. Thanks.   

## 2019-11-08 ENCOUNTER — Encounter: Payer: Self-pay | Admitting: Family Medicine

## 2019-11-11 DIAGNOSIS — K5900 Constipation, unspecified: Secondary | ICD-10-CM | POA: Diagnosis not present

## 2019-11-11 DIAGNOSIS — K219 Gastro-esophageal reflux disease without esophagitis: Secondary | ICD-10-CM | POA: Diagnosis not present

## 2019-11-26 ENCOUNTER — Ambulatory Visit (INDEPENDENT_AMBULATORY_CARE_PROVIDER_SITE_OTHER): Payer: Medicare Other | Admitting: *Deleted

## 2019-11-26 DIAGNOSIS — I4891 Unspecified atrial fibrillation: Secondary | ICD-10-CM | POA: Diagnosis not present

## 2019-11-27 LAB — CUP PACEART REMOTE DEVICE CHECK
Battery Impedance: 1819 Ohm
Battery Remaining Longevity: 44 mo
Battery Voltage: 2.73 V
Brady Statistic RV Percent Paced: 18 %
Date Time Interrogation Session: 20210728115214
Implantable Lead Implant Date: 20160318
Implantable Lead Location: 753860
Implantable Lead Model: 5076
Implantable Pulse Generator Implant Date: 20160318
Lead Channel Impedance Value: 0 Ohm
Lead Channel Impedance Value: 490 Ohm
Lead Channel Pacing Threshold Amplitude: 1.25 V
Lead Channel Pacing Threshold Pulse Width: 0.4 ms
Lead Channel Setting Pacing Amplitude: 2.5 V
Lead Channel Setting Pacing Pulse Width: 0.4 ms
Lead Channel Setting Sensing Sensitivity: 2 mV

## 2019-12-02 DIAGNOSIS — Z96641 Presence of right artificial hip joint: Secondary | ICD-10-CM | POA: Diagnosis not present

## 2019-12-02 DIAGNOSIS — M7061 Trochanteric bursitis, right hip: Secondary | ICD-10-CM | POA: Diagnosis not present

## 2019-12-02 NOTE — Progress Notes (Signed)
Remote pacemaker transmission.   

## 2019-12-13 ENCOUNTER — Ambulatory Visit: Payer: Medicare Other | Admitting: Neurology

## 2020-01-01 DIAGNOSIS — K5900 Constipation, unspecified: Secondary | ICD-10-CM | POA: Diagnosis not present

## 2020-01-01 DIAGNOSIS — R451 Restlessness and agitation: Secondary | ICD-10-CM | POA: Diagnosis not present

## 2020-01-03 DIAGNOSIS — K5900 Constipation, unspecified: Secondary | ICD-10-CM | POA: Diagnosis not present

## 2020-01-03 DIAGNOSIS — R1310 Dysphagia, unspecified: Secondary | ICD-10-CM | POA: Diagnosis not present

## 2020-01-03 DIAGNOSIS — H3530 Unspecified macular degeneration: Secondary | ICD-10-CM | POA: Diagnosis not present

## 2020-01-03 DIAGNOSIS — Z8673 Personal history of transient ischemic attack (TIA), and cerebral infarction without residual deficits: Secondary | ICD-10-CM | POA: Diagnosis not present

## 2020-01-03 DIAGNOSIS — M7061 Trochanteric bursitis, right hip: Secondary | ICD-10-CM | POA: Diagnosis not present

## 2020-01-03 DIAGNOSIS — R339 Retention of urine, unspecified: Secondary | ICD-10-CM | POA: Diagnosis not present

## 2020-01-03 DIAGNOSIS — H3320 Serous retinal detachment, unspecified eye: Secondary | ICD-10-CM | POA: Diagnosis not present

## 2020-01-03 DIAGNOSIS — F0390 Unspecified dementia without behavioral disturbance: Secondary | ICD-10-CM | POA: Diagnosis not present

## 2020-01-03 DIAGNOSIS — I2510 Atherosclerotic heart disease of native coronary artery without angina pectoris: Secondary | ICD-10-CM | POA: Diagnosis not present

## 2020-01-03 DIAGNOSIS — I878 Other specified disorders of veins: Secondary | ICD-10-CM | POA: Diagnosis not present

## 2020-01-03 DIAGNOSIS — I442 Atrioventricular block, complete: Secondary | ICD-10-CM | POA: Diagnosis not present

## 2020-01-03 DIAGNOSIS — R32 Unspecified urinary incontinence: Secondary | ICD-10-CM | POA: Diagnosis not present

## 2020-01-03 DIAGNOSIS — I4891 Unspecified atrial fibrillation: Secondary | ICD-10-CM | POA: Diagnosis not present

## 2020-01-03 DIAGNOSIS — Z9181 History of falling: Secondary | ICD-10-CM | POA: Diagnosis not present

## 2020-01-03 DIAGNOSIS — I10 Essential (primary) hypertension: Secondary | ICD-10-CM | POA: Diagnosis not present

## 2020-01-03 DIAGNOSIS — I824Z9 Acute embolism and thrombosis of unspecified deep veins of unspecified distal lower extremity: Secondary | ICD-10-CM | POA: Diagnosis not present

## 2020-01-03 DIAGNOSIS — E559 Vitamin D deficiency, unspecified: Secondary | ICD-10-CM | POA: Diagnosis not present

## 2020-01-03 DIAGNOSIS — F0391 Unspecified dementia with behavioral disturbance: Secondary | ICD-10-CM | POA: Diagnosis not present

## 2020-01-03 DIAGNOSIS — K219 Gastro-esophageal reflux disease without esophagitis: Secondary | ICD-10-CM | POA: Diagnosis not present

## 2020-01-03 DIAGNOSIS — F17200 Nicotine dependence, unspecified, uncomplicated: Secondary | ICD-10-CM | POA: Diagnosis not present

## 2020-01-03 DIAGNOSIS — E785 Hyperlipidemia, unspecified: Secondary | ICD-10-CM | POA: Diagnosis not present

## 2020-01-03 DIAGNOSIS — I252 Old myocardial infarction: Secondary | ICD-10-CM | POA: Diagnosis not present

## 2020-01-08 DIAGNOSIS — E785 Hyperlipidemia, unspecified: Secondary | ICD-10-CM | POA: Diagnosis not present

## 2020-01-08 DIAGNOSIS — M7061 Trochanteric bursitis, right hip: Secondary | ICD-10-CM | POA: Diagnosis not present

## 2020-01-08 DIAGNOSIS — F0391 Unspecified dementia with behavioral disturbance: Secondary | ICD-10-CM | POA: Diagnosis not present

## 2020-01-08 DIAGNOSIS — R339 Retention of urine, unspecified: Secondary | ICD-10-CM | POA: Diagnosis not present

## 2020-01-08 DIAGNOSIS — I878 Other specified disorders of veins: Secondary | ICD-10-CM | POA: Diagnosis not present

## 2020-01-08 DIAGNOSIS — E559 Vitamin D deficiency, unspecified: Secondary | ICD-10-CM | POA: Diagnosis not present

## 2020-01-10 DIAGNOSIS — E785 Hyperlipidemia, unspecified: Secondary | ICD-10-CM | POA: Diagnosis not present

## 2020-01-10 DIAGNOSIS — M7061 Trochanteric bursitis, right hip: Secondary | ICD-10-CM | POA: Diagnosis not present

## 2020-01-10 DIAGNOSIS — F0391 Unspecified dementia with behavioral disturbance: Secondary | ICD-10-CM | POA: Diagnosis not present

## 2020-01-10 DIAGNOSIS — I878 Other specified disorders of veins: Secondary | ICD-10-CM | POA: Diagnosis not present

## 2020-01-10 DIAGNOSIS — R339 Retention of urine, unspecified: Secondary | ICD-10-CM | POA: Diagnosis not present

## 2020-01-10 DIAGNOSIS — E559 Vitamin D deficiency, unspecified: Secondary | ICD-10-CM | POA: Diagnosis not present

## 2020-01-13 DIAGNOSIS — K5900 Constipation, unspecified: Secondary | ICD-10-CM | POA: Diagnosis not present

## 2020-01-13 DIAGNOSIS — R451 Restlessness and agitation: Secondary | ICD-10-CM | POA: Diagnosis not present

## 2020-01-14 DIAGNOSIS — E559 Vitamin D deficiency, unspecified: Secondary | ICD-10-CM | POA: Diagnosis not present

## 2020-01-14 DIAGNOSIS — M7061 Trochanteric bursitis, right hip: Secondary | ICD-10-CM | POA: Diagnosis not present

## 2020-01-14 DIAGNOSIS — E785 Hyperlipidemia, unspecified: Secondary | ICD-10-CM | POA: Diagnosis not present

## 2020-01-14 DIAGNOSIS — F0391 Unspecified dementia with behavioral disturbance: Secondary | ICD-10-CM | POA: Diagnosis not present

## 2020-01-14 DIAGNOSIS — R339 Retention of urine, unspecified: Secondary | ICD-10-CM | POA: Diagnosis not present

## 2020-01-14 DIAGNOSIS — I878 Other specified disorders of veins: Secondary | ICD-10-CM | POA: Diagnosis not present

## 2020-01-16 DIAGNOSIS — F0391 Unspecified dementia with behavioral disturbance: Secondary | ICD-10-CM | POA: Diagnosis not present

## 2020-01-16 DIAGNOSIS — R339 Retention of urine, unspecified: Secondary | ICD-10-CM | POA: Diagnosis not present

## 2020-01-16 DIAGNOSIS — I878 Other specified disorders of veins: Secondary | ICD-10-CM | POA: Diagnosis not present

## 2020-01-16 DIAGNOSIS — M7061 Trochanteric bursitis, right hip: Secondary | ICD-10-CM | POA: Diagnosis not present

## 2020-01-16 DIAGNOSIS — E559 Vitamin D deficiency, unspecified: Secondary | ICD-10-CM | POA: Diagnosis not present

## 2020-01-16 DIAGNOSIS — E785 Hyperlipidemia, unspecified: Secondary | ICD-10-CM | POA: Diagnosis not present

## 2020-01-18 DIAGNOSIS — I878 Other specified disorders of veins: Secondary | ICD-10-CM | POA: Diagnosis not present

## 2020-01-18 DIAGNOSIS — E785 Hyperlipidemia, unspecified: Secondary | ICD-10-CM | POA: Diagnosis not present

## 2020-01-18 DIAGNOSIS — M7061 Trochanteric bursitis, right hip: Secondary | ICD-10-CM | POA: Diagnosis not present

## 2020-01-18 DIAGNOSIS — R339 Retention of urine, unspecified: Secondary | ICD-10-CM | POA: Diagnosis not present

## 2020-01-18 DIAGNOSIS — F0391 Unspecified dementia with behavioral disturbance: Secondary | ICD-10-CM | POA: Diagnosis not present

## 2020-01-18 DIAGNOSIS — E559 Vitamin D deficiency, unspecified: Secondary | ICD-10-CM | POA: Diagnosis not present

## 2020-01-23 DIAGNOSIS — E559 Vitamin D deficiency, unspecified: Secondary | ICD-10-CM | POA: Diagnosis not present

## 2020-01-23 DIAGNOSIS — I878 Other specified disorders of veins: Secondary | ICD-10-CM | POA: Diagnosis not present

## 2020-01-23 DIAGNOSIS — I10 Essential (primary) hypertension: Secondary | ICD-10-CM | POA: Diagnosis not present

## 2020-01-23 DIAGNOSIS — E785 Hyperlipidemia, unspecified: Secondary | ICD-10-CM | POA: Diagnosis not present

## 2020-01-23 DIAGNOSIS — M7061 Trochanteric bursitis, right hip: Secondary | ICD-10-CM | POA: Diagnosis not present

## 2020-01-23 DIAGNOSIS — F0391 Unspecified dementia with behavioral disturbance: Secondary | ICD-10-CM | POA: Diagnosis not present

## 2020-01-23 DIAGNOSIS — R339 Retention of urine, unspecified: Secondary | ICD-10-CM | POA: Diagnosis not present

## 2020-01-27 ENCOUNTER — Telehealth: Payer: Self-pay | Admitting: Internal Medicine

## 2020-01-27 ENCOUNTER — Telehealth: Payer: Self-pay | Admitting: *Deleted

## 2020-01-27 NOTE — Telephone Encounter (Addendum)
Daughter says there is a NP that is doing home visits now and that NP does not use [facility].  Jeani Hawking with Hospice advised.

## 2020-01-27 NOTE — Telephone Encounter (Signed)
Please check with his daughter.  I am glad to be involved with hospice help if patient is going on hospice and if family consents.  Thanks.

## 2020-01-27 NOTE — Telephone Encounter (Signed)
Spoke to [place] and let her know that I have reached out to Dr. Rayann Heman and will get back to her when I know more regarding her request.

## 2020-01-27 NOTE — Telephone Encounter (Signed)
Jeani Hawking clinical nurse with [facility] left a voicemail stating that they need a verbal order to admit patient to their hospice  care. Jeani Hawking wants to know if Dr. Damita Dunnings wants to remain attending physician along with their medical director or if he wants to turn all care over to their director?

## 2020-01-27 NOTE — Telephone Encounter (Signed)
New Message:     Need a verbal order asap, on her way to pt's home. Need an order for [place]. Will  Dr Rayann Heman want to maintain care for this pt or want Hospice Medical Director to take over?

## 2020-01-30 DIAGNOSIS — I878 Other specified disorders of veins: Secondary | ICD-10-CM | POA: Diagnosis not present

## 2020-01-30 DIAGNOSIS — F0391 Unspecified dementia with behavioral disturbance: Secondary | ICD-10-CM | POA: Diagnosis not present

## 2020-01-30 DIAGNOSIS — M7061 Trochanteric bursitis, right hip: Secondary | ICD-10-CM | POA: Diagnosis not present

## 2020-01-30 DIAGNOSIS — E785 Hyperlipidemia, unspecified: Secondary | ICD-10-CM | POA: Diagnosis not present

## 2020-01-30 DIAGNOSIS — E559 Vitamin D deficiency, unspecified: Secondary | ICD-10-CM | POA: Diagnosis not present

## 2020-01-30 DIAGNOSIS — R339 Retention of urine, unspecified: Secondary | ICD-10-CM | POA: Diagnosis not present

## 2020-01-30 NOTE — Telephone Encounter (Signed)
Spoke to [place] for an update with this patient to see if she was able to get in contact with the patients PCP. Advised that an in home NP was going to set him up with [facility] but that hasn't started yet. [place] is going to keep an eye on things to make sure the referral goes through.

## 2020-01-31 DIAGNOSIS — N400 Enlarged prostate without lower urinary tract symptoms: Secondary | ICD-10-CM | POA: Diagnosis not present

## 2020-01-31 DIAGNOSIS — E785 Hyperlipidemia, unspecified: Secondary | ICD-10-CM | POA: Diagnosis not present

## 2020-01-31 DIAGNOSIS — K219 Gastro-esophageal reflux disease without esophagitis: Secondary | ICD-10-CM | POA: Diagnosis not present

## 2020-01-31 DIAGNOSIS — I672 Cerebral atherosclerosis: Secondary | ICD-10-CM | POA: Diagnosis not present

## 2020-01-31 DIAGNOSIS — I10 Essential (primary) hypertension: Secondary | ICD-10-CM | POA: Diagnosis not present

## 2020-01-31 DIAGNOSIS — I4891 Unspecified atrial fibrillation: Secondary | ICD-10-CM | POA: Diagnosis not present

## 2020-01-31 DIAGNOSIS — I69354 Hemiplegia and hemiparesis following cerebral infarction affecting left non-dominant side: Secondary | ICD-10-CM | POA: Diagnosis not present

## 2020-01-31 DIAGNOSIS — Z6825 Body mass index (BMI) 25.0-25.9, adult: Secondary | ICD-10-CM | POA: Diagnosis not present

## 2020-01-31 DIAGNOSIS — F0150 Vascular dementia without behavioral disturbance: Secondary | ICD-10-CM | POA: Diagnosis not present

## 2020-01-31 DIAGNOSIS — I2510 Atherosclerotic heart disease of native coronary artery without angina pectoris: Secondary | ICD-10-CM | POA: Diagnosis not present

## 2020-01-31 DIAGNOSIS — F32A Depression, unspecified: Secondary | ICD-10-CM | POA: Diagnosis not present

## 2020-02-02 DIAGNOSIS — M7061 Trochanteric bursitis, right hip: Secondary | ICD-10-CM | POA: Diagnosis not present

## 2020-02-03 DIAGNOSIS — I69354 Hemiplegia and hemiparesis following cerebral infarction affecting left non-dominant side: Secondary | ICD-10-CM | POA: Diagnosis not present

## 2020-02-03 DIAGNOSIS — F0150 Vascular dementia without behavioral disturbance: Secondary | ICD-10-CM | POA: Diagnosis not present

## 2020-02-03 DIAGNOSIS — I10 Essential (primary) hypertension: Secondary | ICD-10-CM | POA: Diagnosis not present

## 2020-02-03 DIAGNOSIS — E785 Hyperlipidemia, unspecified: Secondary | ICD-10-CM | POA: Diagnosis not present

## 2020-02-03 DIAGNOSIS — I672 Cerebral atherosclerosis: Secondary | ICD-10-CM | POA: Diagnosis not present

## 2020-02-03 DIAGNOSIS — I4891 Unspecified atrial fibrillation: Secondary | ICD-10-CM | POA: Diagnosis not present

## 2020-02-04 DIAGNOSIS — F0150 Vascular dementia without behavioral disturbance: Secondary | ICD-10-CM | POA: Diagnosis not present

## 2020-02-04 DIAGNOSIS — I69354 Hemiplegia and hemiparesis following cerebral infarction affecting left non-dominant side: Secondary | ICD-10-CM | POA: Diagnosis not present

## 2020-02-04 DIAGNOSIS — G4700 Insomnia, unspecified: Secondary | ICD-10-CM | POA: Diagnosis not present

## 2020-02-04 DIAGNOSIS — I4891 Unspecified atrial fibrillation: Secondary | ICD-10-CM | POA: Diagnosis not present

## 2020-02-04 DIAGNOSIS — E785 Hyperlipidemia, unspecified: Secondary | ICD-10-CM | POA: Diagnosis not present

## 2020-02-04 DIAGNOSIS — I10 Essential (primary) hypertension: Secondary | ICD-10-CM | POA: Diagnosis not present

## 2020-02-04 DIAGNOSIS — N400 Enlarged prostate without lower urinary tract symptoms: Secondary | ICD-10-CM | POA: Diagnosis not present

## 2020-02-04 DIAGNOSIS — I672 Cerebral atherosclerosis: Secondary | ICD-10-CM | POA: Diagnosis not present

## 2020-02-06 DIAGNOSIS — I10 Essential (primary) hypertension: Secondary | ICD-10-CM | POA: Diagnosis not present

## 2020-02-06 DIAGNOSIS — I672 Cerebral atherosclerosis: Secondary | ICD-10-CM | POA: Diagnosis not present

## 2020-02-06 DIAGNOSIS — I4891 Unspecified atrial fibrillation: Secondary | ICD-10-CM | POA: Diagnosis not present

## 2020-02-06 DIAGNOSIS — F0150 Vascular dementia without behavioral disturbance: Secondary | ICD-10-CM | POA: Diagnosis not present

## 2020-02-06 DIAGNOSIS — E785 Hyperlipidemia, unspecified: Secondary | ICD-10-CM | POA: Diagnosis not present

## 2020-02-06 DIAGNOSIS — I69354 Hemiplegia and hemiparesis following cerebral infarction affecting left non-dominant side: Secondary | ICD-10-CM | POA: Diagnosis not present

## 2020-02-07 DIAGNOSIS — E785 Hyperlipidemia, unspecified: Secondary | ICD-10-CM | POA: Diagnosis not present

## 2020-02-07 DIAGNOSIS — I672 Cerebral atherosclerosis: Secondary | ICD-10-CM | POA: Diagnosis not present

## 2020-02-07 DIAGNOSIS — F0150 Vascular dementia without behavioral disturbance: Secondary | ICD-10-CM | POA: Diagnosis not present

## 2020-02-07 DIAGNOSIS — I69354 Hemiplegia and hemiparesis following cerebral infarction affecting left non-dominant side: Secondary | ICD-10-CM | POA: Diagnosis not present

## 2020-02-07 DIAGNOSIS — I10 Essential (primary) hypertension: Secondary | ICD-10-CM | POA: Diagnosis not present

## 2020-02-07 DIAGNOSIS — I4891 Unspecified atrial fibrillation: Secondary | ICD-10-CM | POA: Diagnosis not present

## 2020-02-10 DIAGNOSIS — F0150 Vascular dementia without behavioral disturbance: Secondary | ICD-10-CM | POA: Diagnosis not present

## 2020-02-10 DIAGNOSIS — E785 Hyperlipidemia, unspecified: Secondary | ICD-10-CM | POA: Diagnosis not present

## 2020-02-10 DIAGNOSIS — I69354 Hemiplegia and hemiparesis following cerebral infarction affecting left non-dominant side: Secondary | ICD-10-CM | POA: Diagnosis not present

## 2020-02-10 DIAGNOSIS — I10 Essential (primary) hypertension: Secondary | ICD-10-CM | POA: Diagnosis not present

## 2020-02-10 DIAGNOSIS — I4891 Unspecified atrial fibrillation: Secondary | ICD-10-CM | POA: Diagnosis not present

## 2020-02-10 DIAGNOSIS — I672 Cerebral atherosclerosis: Secondary | ICD-10-CM | POA: Diagnosis not present

## 2020-02-13 DIAGNOSIS — I10 Essential (primary) hypertension: Secondary | ICD-10-CM | POA: Diagnosis not present

## 2020-02-13 DIAGNOSIS — I69354 Hemiplegia and hemiparesis following cerebral infarction affecting left non-dominant side: Secondary | ICD-10-CM | POA: Diagnosis not present

## 2020-02-13 DIAGNOSIS — E785 Hyperlipidemia, unspecified: Secondary | ICD-10-CM | POA: Diagnosis not present

## 2020-02-13 DIAGNOSIS — I4891 Unspecified atrial fibrillation: Secondary | ICD-10-CM | POA: Diagnosis not present

## 2020-02-13 DIAGNOSIS — I672 Cerebral atherosclerosis: Secondary | ICD-10-CM | POA: Diagnosis not present

## 2020-02-13 DIAGNOSIS — F0150 Vascular dementia without behavioral disturbance: Secondary | ICD-10-CM | POA: Diagnosis not present

## 2020-02-14 DIAGNOSIS — I10 Essential (primary) hypertension: Secondary | ICD-10-CM | POA: Diagnosis not present

## 2020-02-14 DIAGNOSIS — E785 Hyperlipidemia, unspecified: Secondary | ICD-10-CM | POA: Diagnosis not present

## 2020-02-14 DIAGNOSIS — I4891 Unspecified atrial fibrillation: Secondary | ICD-10-CM | POA: Diagnosis not present

## 2020-02-14 DIAGNOSIS — F0150 Vascular dementia without behavioral disturbance: Secondary | ICD-10-CM | POA: Diagnosis not present

## 2020-02-14 DIAGNOSIS — I672 Cerebral atherosclerosis: Secondary | ICD-10-CM | POA: Diagnosis not present

## 2020-02-14 DIAGNOSIS — I69354 Hemiplegia and hemiparesis following cerebral infarction affecting left non-dominant side: Secondary | ICD-10-CM | POA: Diagnosis not present

## 2020-02-17 DIAGNOSIS — I69354 Hemiplegia and hemiparesis following cerebral infarction affecting left non-dominant side: Secondary | ICD-10-CM | POA: Diagnosis not present

## 2020-02-17 DIAGNOSIS — E785 Hyperlipidemia, unspecified: Secondary | ICD-10-CM | POA: Diagnosis not present

## 2020-02-17 DIAGNOSIS — I672 Cerebral atherosclerosis: Secondary | ICD-10-CM | POA: Diagnosis not present

## 2020-02-17 DIAGNOSIS — F0150 Vascular dementia without behavioral disturbance: Secondary | ICD-10-CM | POA: Diagnosis not present

## 2020-02-17 DIAGNOSIS — I10 Essential (primary) hypertension: Secondary | ICD-10-CM | POA: Diagnosis not present

## 2020-02-17 DIAGNOSIS — I4891 Unspecified atrial fibrillation: Secondary | ICD-10-CM | POA: Diagnosis not present

## 2020-02-18 DIAGNOSIS — I10 Essential (primary) hypertension: Secondary | ICD-10-CM | POA: Diagnosis not present

## 2020-02-18 DIAGNOSIS — I672 Cerebral atherosclerosis: Secondary | ICD-10-CM | POA: Diagnosis not present

## 2020-02-18 DIAGNOSIS — I4891 Unspecified atrial fibrillation: Secondary | ICD-10-CM | POA: Diagnosis not present

## 2020-02-18 DIAGNOSIS — I69354 Hemiplegia and hemiparesis following cerebral infarction affecting left non-dominant side: Secondary | ICD-10-CM | POA: Diagnosis not present

## 2020-02-18 DIAGNOSIS — E785 Hyperlipidemia, unspecified: Secondary | ICD-10-CM | POA: Diagnosis not present

## 2020-02-18 DIAGNOSIS — F0150 Vascular dementia without behavioral disturbance: Secondary | ICD-10-CM | POA: Diagnosis not present

## 2020-02-19 DIAGNOSIS — I69354 Hemiplegia and hemiparesis following cerebral infarction affecting left non-dominant side: Secondary | ICD-10-CM | POA: Diagnosis not present

## 2020-02-19 DIAGNOSIS — I4891 Unspecified atrial fibrillation: Secondary | ICD-10-CM | POA: Diagnosis not present

## 2020-02-19 DIAGNOSIS — E785 Hyperlipidemia, unspecified: Secondary | ICD-10-CM | POA: Diagnosis not present

## 2020-02-19 DIAGNOSIS — I10 Essential (primary) hypertension: Secondary | ICD-10-CM | POA: Diagnosis not present

## 2020-02-19 DIAGNOSIS — I672 Cerebral atherosclerosis: Secondary | ICD-10-CM | POA: Diagnosis not present

## 2020-02-19 DIAGNOSIS — F0150 Vascular dementia without behavioral disturbance: Secondary | ICD-10-CM | POA: Diagnosis not present

## 2020-02-20 ENCOUNTER — Telehealth: Payer: Self-pay

## 2020-02-20 DIAGNOSIS — I69354 Hemiplegia and hemiparesis following cerebral infarction affecting left non-dominant side: Secondary | ICD-10-CM | POA: Diagnosis not present

## 2020-02-20 DIAGNOSIS — I672 Cerebral atherosclerosis: Secondary | ICD-10-CM | POA: Diagnosis not present

## 2020-02-20 DIAGNOSIS — I10 Essential (primary) hypertension: Secondary | ICD-10-CM | POA: Diagnosis not present

## 2020-02-20 DIAGNOSIS — E785 Hyperlipidemia, unspecified: Secondary | ICD-10-CM | POA: Diagnosis not present

## 2020-02-20 DIAGNOSIS — I4891 Unspecified atrial fibrillation: Secondary | ICD-10-CM | POA: Diagnosis not present

## 2020-02-20 DIAGNOSIS — F0150 Vascular dementia without behavioral disturbance: Secondary | ICD-10-CM | POA: Diagnosis not present

## 2020-02-20 NOTE — Telephone Encounter (Signed)
The pt daughter Ann(dpr) states the pt is in hospice care. She asked me to cancel all home remote monitoring appointments. She states the pt is incoherent and will pass away soon. She also said she got a letter to schedule his yearly follow up. She is not going to make that appointment. I told her I will make the doctor aware they do not want the pt to be followed remotely anymore. I did cancel the remotes.

## 2020-02-21 DIAGNOSIS — I672 Cerebral atherosclerosis: Secondary | ICD-10-CM | POA: Diagnosis not present

## 2020-02-21 DIAGNOSIS — I4891 Unspecified atrial fibrillation: Secondary | ICD-10-CM | POA: Diagnosis not present

## 2020-02-21 DIAGNOSIS — I10 Essential (primary) hypertension: Secondary | ICD-10-CM | POA: Diagnosis not present

## 2020-02-21 DIAGNOSIS — I69354 Hemiplegia and hemiparesis following cerebral infarction affecting left non-dominant side: Secondary | ICD-10-CM | POA: Diagnosis not present

## 2020-02-21 DIAGNOSIS — E785 Hyperlipidemia, unspecified: Secondary | ICD-10-CM | POA: Diagnosis not present

## 2020-02-21 DIAGNOSIS — F0150 Vascular dementia without behavioral disturbance: Secondary | ICD-10-CM | POA: Diagnosis not present

## 2020-02-22 NOTE — Telephone Encounter (Signed)
Ok to discontinue remote monitoring as per family request.

## 2020-02-25 NOTE — Telephone Encounter (Signed)
Pt has passed away. There is an Tour manager.

## 2020-02-26 ENCOUNTER — Telehealth: Payer: Self-pay | Admitting: Family Medicine

## 2020-02-26 NOTE — Telephone Encounter (Signed)
Called to given condolences to daughter.  Family took my message and will pass along.  I thank all involved.

## 2020-03-02 DEATH — deceased

## 2020-03-04 DIAGNOSIS — G4700 Insomnia, unspecified: Secondary | ICD-10-CM | POA: Diagnosis not present

## 2020-03-04 DIAGNOSIS — N400 Enlarged prostate without lower urinary tract symptoms: Secondary | ICD-10-CM | POA: Diagnosis not present
